# Patient Record
Sex: Male | Born: 1935 | Race: White | Hispanic: No | Marital: Married | Smoking: Former smoker
Health system: Southern US, Community
[De-identification: ages and names within clinical notes are randomized; demographics above are authoritative.]

## PROBLEM LIST (undated history)

## (undated) DIAGNOSIS — Z8711 Personal history of peptic ulcer disease: Secondary | ICD-10-CM

## (undated) DIAGNOSIS — Z954 Presence of other heart-valve replacement: Secondary | ICD-10-CM

## (undated) DIAGNOSIS — I480 Paroxysmal atrial fibrillation: Secondary | ICD-10-CM

## (undated) DIAGNOSIS — K4090 Unilateral inguinal hernia, without obstruction or gangrene, not specified as recurrent: Secondary | ICD-10-CM

## (undated) DIAGNOSIS — Z85828 Personal history of other malignant neoplasm of skin: Secondary | ICD-10-CM

## (undated) DIAGNOSIS — I351 Nonrheumatic aortic (valve) insufficiency: Secondary | ICD-10-CM

## (undated) DIAGNOSIS — L120 Bullous pemphigoid: Secondary | ICD-10-CM

## (undated) DIAGNOSIS — K635 Polyp of colon: Secondary | ICD-10-CM

## (undated) DIAGNOSIS — Z872 Personal history of diseases of the skin and subcutaneous tissue: Secondary | ICD-10-CM

## (undated) DIAGNOSIS — Z7901 Long term (current) use of anticoagulants: Secondary | ICD-10-CM

## (undated) DIAGNOSIS — N400 Enlarged prostate without lower urinary tract symptoms: Secondary | ICD-10-CM

## (undated) DIAGNOSIS — D649 Anemia, unspecified: Secondary | ICD-10-CM

## (undated) DIAGNOSIS — E785 Hyperlipidemia, unspecified: Secondary | ICD-10-CM

## (undated) DIAGNOSIS — D0471 Carcinoma in situ of skin of right lower limb, including hip: Secondary | ICD-10-CM

## (undated) HISTORY — DX: Benign prostatic hyperplasia without lower urinary tract symptoms: N40.0

## (undated) HISTORY — DX: Hyperlipidemia, unspecified: E78.5

## (undated) HISTORY — DX: Nonrheumatic aortic (valve) insufficiency: I35.1

## (undated) HISTORY — PX: OTHER SURGICAL HISTORY: SHX169

## (undated) HISTORY — DX: Personal history of peptic ulcer disease: Z87.11

## (undated) HISTORY — DX: Paroxysmal atrial fibrillation: I48.0

## (undated) HISTORY — DX: Anemia, unspecified: D64.9

## (undated) HISTORY — DX: Presence of other heart-valve replacement: Z95.4

## (undated) HISTORY — PX: TRANSTHORACIC ECHOCARDIOGRAM: SHX275

## (undated) HISTORY — DX: Unilateral inguinal hernia, without obstruction or gangrene, not specified as recurrent: K40.90

## (undated) HISTORY — DX: Bullous pemphigoid: L12.0

## (undated) HISTORY — DX: Long term (current) use of anticoagulants: Z79.01

## (undated) HISTORY — DX: Polyp of colon: K63.5

---

## 1996-03-28 DIAGNOSIS — Z954 Presence of other heart-valve replacement: Secondary | ICD-10-CM

## 1996-03-28 HISTORY — PX: MITRAL VALVE REPLACEMENT: SHX147

## 1996-03-28 HISTORY — DX: Presence of other heart-valve replacement: Z95.4

## 1996-04-08 DIAGNOSIS — Z952 Presence of prosthetic heart valve: Secondary | ICD-10-CM | POA: Insufficient documentation

## 1998-12-13 ENCOUNTER — Encounter: Payer: Self-pay | Admitting: Emergency Medicine

## 1998-12-13 ENCOUNTER — Emergency Department (HOSPITAL_COMMUNITY): Admission: EM | Admit: 1998-12-13 | Discharge: 1998-12-13 | Payer: Self-pay | Admitting: Emergency Medicine

## 2000-07-22 ENCOUNTER — Ambulatory Visit (HOSPITAL_COMMUNITY): Admission: RE | Admit: 2000-07-22 | Discharge: 2000-07-22 | Payer: Self-pay | Admitting: Gastroenterology

## 2003-05-01 ENCOUNTER — Ambulatory Visit (HOSPITAL_COMMUNITY): Admission: RE | Admit: 2003-05-01 | Discharge: 2003-05-01 | Payer: Self-pay | Admitting: Specialist

## 2003-08-26 ENCOUNTER — Encounter: Admission: RE | Admit: 2003-08-26 | Discharge: 2003-08-26 | Payer: Self-pay | Admitting: Orthopedic Surgery

## 2003-12-25 ENCOUNTER — Inpatient Hospital Stay (HOSPITAL_COMMUNITY): Admission: RE | Admit: 2003-12-25 | Discharge: 2003-12-28 | Payer: Self-pay | Admitting: Orthopedic Surgery

## 2007-01-18 ENCOUNTER — Ambulatory Visit (HOSPITAL_COMMUNITY): Admission: RE | Admit: 2007-01-18 | Discharge: 2007-01-18 | Payer: Self-pay | Admitting: Gastroenterology

## 2007-01-19 ENCOUNTER — Encounter (INDEPENDENT_AMBULATORY_CARE_PROVIDER_SITE_OTHER): Payer: Self-pay | Admitting: General Surgery

## 2007-01-19 ENCOUNTER — Ambulatory Visit (HOSPITAL_COMMUNITY): Admission: RE | Admit: 2007-01-19 | Discharge: 2007-01-19 | Payer: Self-pay | Admitting: General Surgery

## 2007-06-29 DIAGNOSIS — I351 Nonrheumatic aortic (valve) insufficiency: Secondary | ICD-10-CM

## 2007-06-29 HISTORY — DX: Nonrheumatic aortic (valve) insufficiency: I35.1

## 2010-07-18 ENCOUNTER — Encounter: Payer: Self-pay | Admitting: Orthopedic Surgery

## 2010-11-10 NOTE — Op Note (Signed)
Richard Spears, Richard Spears           ACCOUNT NO.:  [id number]   MEDICAL RECORD NO.:  [id number]          PATIENT TYPE:  AMB   LOCATION:  ENDO                         FACILITY:  [facility]   PHYSICIAN:  John C. Madilyn Fireman, M.D.    DATE OF BIRTH:  Apr 02, 1926   DATE OF PROCEDURE:  01/18/2007  DATE OF DISCHARGE:                               OPERATIVE REPORT   COLONOSCOPY:   INDICATIONS FOR PROCEDURE:  Rectal bleeding and history of benign colon  polyps 5 years ago in a patient undergoing a hemorrhoid surgery  tomorrow.   PROCEDURE:  The patient was placed in the left lateral decubitus  position and placed on the pulse monitor with continuous low-flow oxygen  delivered by nasal cannula.  He was sedated with 100 mcg IV fentanyl and  8 mg IV Versed.  The Olympus video colonoscope was inserted into the  rectum and advanced to cecum, confirmed by transillumination of  McBurney's point and visualization of the ileocecal valve and  appendiceal orifice.  The prep was fairly good.  The cecum, ascending,  transverse, descending, sigmoid and proximal rectum all appeared normal  with no masses, polyps, diverticula or other mucosal abnormalities.  In  the distal rectum just at the anal verge there was a whitish 1 cm fleshy  protuberance presumed to be a hemorrhoid.  It was not bleeding.  This  was photographed in retroflex and direct view.  The scope was then  withdrawn and the patient returned to the recovery room in stable  condition.  He tolerated the procedure well and there were no immediate  complications.   IMPRESSION:  Presumed thrombosed internal hemorrhoid, otherwise normal  study.  Plan for hemorrhoid surgery tomorrow.           ______________________________  Everardo All Madilyn Fireman, M.D.     JCH/MEDQ  D:  01/18/2007  T:  01/18/2007  Job:  [id number]   cc:   Thora Lance, M.D.  Fax: [phone]   Timothy E. Earlene Plater, M.D.  [address] [place]  [place] [address]

## 2010-11-10 NOTE — Op Note (Signed)
Richard Spears, Richard Spears           ACCOUNT NO.:  [id number]   MEDICAL RECORD NO.:  [id number]          PATIENT TYPE:  AMB   LOCATION:  DAY                          FACILITY:  [facility]   PHYSICIAN:  Timothy E. Earlene Plater, M.D. DATE OF BIRTH:  [birth year]   DATE OF PROCEDURE:  01/19/2007  DATE OF DISCHARGE:                               OPERATIVE REPORT   PREOPERATIVE DIAGNOSIS:  Prolapsing hemorrhoids.   POSTOPERATIVE DIAGNOSIS:  Prolapsing hemorrhoids.   PROCEDURE:  PPH hemorrhoidectomy.   SURGEON:  Timothy E. Earlene Plater, M.D.   ANESTHESIA:  General.   Mr. Waybright is 64, generally healthy except for mitral valve  replacement and medications attended to including Coumadin.  He has had  for some time large prolapsing hemorrhoids with soilage, difficulty  cleansing and bleeding.  After careful discussion and watchful waiting,  office treatment, he has elected to proceed with this surgery.  He has  been converted from Coumadin to Lovenox having taken the last dose  yesterday.  He is aware and does understand that chronic blood thinners  and particularly up until the time of surgery certainly risky incidents  of bleeding and complications of surgery potentially.  He is in  agreement.  He wishes to proceed.  He is seen, identified the permit  signed.   Taken to the operating room, placed supine.  LMA anesthesia provided.  He was placed in lithotomy position.  The perianal area inspected,  prepped and draped in the usual fashion.  Hemorrhoids prolapsed easily,  particularly left lateral and posterior.  These were reduced, anoscopy  carried out, redundant distal mucosa and prolapsing hemorrhoids.  The  area was injected round about with Marcaine, epinephrine and Wydase and  this was massaged in well.  The anus dilated nicely to the operating  anoscope and beginning anteriorly a pursestring suture 2-0 Prolene was  placed approximately 4 cm proximal to the dentate line.  This was  checked and was  felt to be appropriate.  The operating scope removed,  the plastic Ethicon scope inserted, sutures drawn through the center of  that scope and held firmly in place in the anorectum.  The opened  stapling device was then gently inserted into the anorectum until it  crossed the pursestring suture.  Pursestring suture was tied about the  central post of the stapling device and then those suture strings  brought down the shaft of the stapling device.  The staple device was  carefully positioned and then closed held for a minute, fired, held for  a minute, then removed.  The rectum was packed, the specimen was  adequate and was sent to pathology.  Careful inspection over the next 10  minutes revealed no complications.  There were couple areas of minor  bleeding that were suture-ligated with 4-0 Vicryl.  There was one skip  area in the left posterior quadrant that simply did not get pulled into  the pursestring tie down, probably I think due to the fact that there  was so much redundant mucosa.  In any case at this area was sutured  along the left lateral hemorrhoidal pole.  Again  no bleeding.  Gelfoam  wrapped in Surgicel was placed in the rectal canal, external dressing.  Counts correct.  He tolerated it well, was removed to recovery room in good condition.  Written and verbal instructions given him and his wife including  Vicodin.  He will be seen and followed as an outpatient.  We will  carefully restart the Lovenox as directed.      Timothy E. Earlene Plater, M.D.  Electronically Signed     TED/MEDQ  D:  01/19/2007  T:  01/20/2007  Job:  [id number]   cc:   Everardo All. Madilyn Fireman, M.D.  Fax: [phone]   Francisca December, M.D.  Fax: [phone]   Thora Lance, M.D.  Fax: [phone]

## 2010-11-13 NOTE — Discharge Summary (Signed)
NAME:  Richard Spears, Richard Spears                     ACCOUNT NO.:  [id number]   MEDICAL RECORD NO.:  [id number]                   PATIENT TYPE:  INP   LOCATION:  0470                                 FACILITY:  [facility]   PHYSICIAN:  Ollen Gross, M.D.                 DATE OF BIRTH:  [birth year]   DATE OF ADMISSION:  12/25/2003  DATE OF DISCHARGE:  12/28/2003                                 DISCHARGE SUMMARY   ADMISSION DIAGNOSES:  1. Osteoarthritis left hip.  2. Valvular heart disease.  3. He murmur.  4. NSAID induced gastrointestinal bleed.  5. Peptic ulcer disease.  6. History of hemorrhoids.  7. Benign prostatic hypertrophy.  8. Osteoarthritis.  9. Status post mitral valve replacement, 1997.   DISCHARGE DIAGNOSES:  1. Osteoarthritis left hip status post left total hip arthroplasty.  2. Postoperative blood loss anemia, did not require transfusion.  3. Mild postoperative hyponatremia improving.  4. Valvular heart disease.  5. He murmur.  6. NSAID induced gastrointestinal bleed.  7. Peptic ulcer disease.  8. History of hemorrhoids.  9. Benign prostatic hypertrophy.  10.      Osteoarthritis.  11.      Status post mitral valve replacement, 1997.   PROCEDURE:  Date of surgery December 25, 2003, left total hip arthroplasty,  surgeon Ollen Gross, M.D., assistant Avel Peace, P.A.-C, anesthesia  general, blood loss 400 mL, Hemovac drain x1.  Complications none.   BRIEF HISTORY:  Richard Spears is a 77 year old male a 6-8 month history of  progressively worsening left hip pain.  He has rapidly progressed arthritis  on x-rays.  He has intractable pain and now presents for a total hip  arthroplasty.   LABORATORY DATA:  CBC on admission showed a hemoglobin of 13.0, hematocrit  of 38.2, white cell count of 2.7, differential all within normal limits.  Postoperative H&H 10.0 and 29.4.  Last noted H&H 8.7 and 25.7.  PT and PTT  preop 23.5 and 47 with an elevated INR of 2.9.  Coag's were  rechecked on the  morning of surgery, normal pro time of 13.0, INR 1.0, PTT of 30. Serial pro  times followed postoperatively, last noted PT and INR 16.7 and 1.6.  Chem  panel on admission all within normal limits. Serial BMET's were followed.  Sodium dropped from 137 down to 132 back up to 133, glucose went up from 79  to 145 back down to 125.  Calcium initially noted at 9.0, last noted at 8.2.  Urinalysis preop negative.  Blood group type O positive.   EKG dated December 19, 2003, sinus bradycardia otherwise normal EKG confirmed by  Learta Codding, M.D.  Chest x-ray two view December 19, 2003 mild chronic  obstructive pulmonary disease, no acute findings.  Left hip films dated December 19, 2003, bilateral hip degenerative arthritis, degenerative changes left  hip greater than right.  Hip and pelvis films postop on December 25, 2003  anatomic alignment status post left total hip arthroplasty no complications.   HOSPITAL COURSE:  The patient admitted to [facility], taken to OR,  underwent above stated procedure, without complications.  The patient  tolerated the procedure well, later transferred to the recovery room then to  the orthopedic floor for continued postop care. Vital signs were followed,  Hemovac drain placed the time of surgery  On postoperative day one, on the  evening of surgery and the early morning of day one, the patient did have  somewhat of a panic attack through the night. He was given Ativan and did  have some benefit. He was started on Lovenox along with his Coumadin because  of his mitral valve replacement for the DVT prophylaxis.  He was on Coumadin  preoperatively which was stopped and now he is placed back on Lovenox and  Coumadin until his Coumadin is therapeutic at which time Lovenox will was  discontinued. By day two, he was feeling much better, the panic attack had  resolved.  PC and IV's were discontinued along with his Foley. He did have a  little bit of a tape  blister when the dressing was changed. The incision was  healing well, Tegaderm was placed over the tape blister. From a therapy  standpoint, Richard Spears did extremely well. He was up ambulating  approximately 80 feet by day one, 150 feet later that evening and still  going over 100 feet on day two. It was felt that he was doing extremely well  and would be ready to go home in the next day or so.  By day three, he was  feeling great, he was asking to be discharged.  His INR was not quite  therapeutic but it was on the rise at 1.6 therefore he was discharged home  on Lovenox and Coumadin.   PLAN:  1. The patient was discharged home on December 28, 2003.  2. Discharge diagnoses please see above.  3. Discharge medications:  Percocet, Robaxin, Coumadin, also given Lovenox     and also Trinsicon.  4. Diet, cardiac diet.  5. Activity, he is 50% partial weight bearing to the left lower extremity.     Home health PT, home health nursing for total hip protocol, hip     precautions.   FOLLOW UP:  Two weeks from surgery, call the office for an appointment at  [phone].   DISPOSITION:  Home.   CONDITION ON DISCHARGE:  Improve.     Richard Spears, P.A.              Ollen Gross, M.D.    ALP/MEDQ  D:  02/03/2004  T:  02/04/2004  Job:  [id number]   cc:   Francisca December, M.D.  [address]  [place]  [place] [address]  Fax: [phone]   Everardo All. Madilyn Fireman, M.D.  [address]  [place]  [place] [address]  Fax: [phone]   Thora Lance, M.D.  [address]  [place]  [place] [address]  Fax: [phone]

## 2010-11-13 NOTE — Op Note (Signed)
NAME:  Richard Spears, Richard Spears                     ACCOUNT NO.:  [id number]   MEDICAL RECORD NO.:  [id number]                   PATIENT TYPE:  INP   LOCATION:  0006                                 FACILITY:  [facility]   PHYSICIAN:  Ollen Gross, M.D.                 DATE OF BIRTH:  December 07, 1925   DATE OF PROCEDURE:  12/25/2003  DATE OF DISCHARGE:                                 OPERATIVE REPORT   PREOPERATIVE DIAGNOSIS:  Osteoarthritis of the left hip.   POSTOPERATIVE DIAGNOSIS:  Osteoarthritis of the left hip.   OPERATION PERFORMED:  Left total hip arthroplasty.   SURGEON:  Ollen Gross, M.D.   ASSISTANT:  Alexzandrew L. Julien Girt, P.A.   ANESTHESIA:  General.   ESTIMATED BLOOD LOSS:  400 mL.   DRAINS:  Hemovac times one.   COMPLICATIONS:  None.   DISPOSITION:  Stable to recovery.   INDICATIONS FOR PROCEDURE:  Richard Spears is a 77 year old male with a six  to eight month history of progressively worsening left hip pain and rapidly  progressive arthritis on radiographs.  He has had intractable pain and  presents now for total hip arthroplasty.   DESCRIPTION OF PROCEDURE:  After successful administration of general  anesthetic, the patient was placed in the right lateral decubitus position  with the left side up and held with a hip positioner.  The left lower  extremity was isolated from the perineum with plastic drapes and prepped and  draped in the usual sterile fashion.  A small posterolateral incision was  made with a 10 blade through subcutaneous tissue to the level of the fascia  lata which was incised in line with the skin incision.  Sciatic nerve was  palpated and protected and the short rotators isolated off the femur.  Capsulectomy was performed as the hip was dislocated. The center of the  femoral head was marked and a trial prosthesis placed such that the center  head corresponded to the center of his native femoral head. Osteotomy line  was marked on the femoral neck  and osteotomy made with an oscillating saw.  The femur was then retracted anteriorly and acetabular exposure obtained.  The labrum ws removed.  There were no significant osteophytes.  Reaming  starts at a 49 coursing increments of 2 to 55 and then a 56 mm Pinnacle  acetabular shell was placed in anatomic position and transfixed with two  dome screws.  Trial 32 mm neutral liner was placed.  The femur was then  prepared with the canal finger and irrigation.  Axial reaming was performed  at 15.5 mm proximal reaming to a 58F and the sleeve machined to a large.  58F large sleeve was placed with a 20 x 15 stem and a 36 + 12 trial neck.  His anteversion is neutral, so I put him in approximately 20 degrees of  anteversion.  A 32 +0 head was placed.  He had a  fair amount of abductor  laxity with this so we went through a neutral +4 liner and a 32 +3 head.  With this, his soft tissues were under much better tension.  His stability  was outstanding with full extension, full external rotation, 70 degrees  flexion, 40 degrees abduction, 90 degrees internal rotation, 90 degrees  flexion, 90 degrees internal rotation.  I could also flex him up to about  140 and he did not dislocate.  By placing the left leg on top of the right,  the leg lengths are equal.  The trials were then removed.  A permanent Apex  hole eliminator placed to the acetabular shell.  Permanent 32 +4 neutral  marathon liner was placed into the shell.  The 36F large sleeve was placed  with a 20 x 15 stem and a 36 +12 neck.  Once again, he was about 20 degrees  beyond his native anteversion.  A 32 +3 head was placed and the hip was  reduced to the same stability parameters.  The wound was irrigated with  antibiotic solution and short rotators were reattached to the femur through  drill holes.  Fascia lata was closed over a Hemovac drain with interrupted  #1 Vicryl, subcu closed with #1 and 2-0 Vicryl and subcuticular running 4-0  Monocryl.   Incision was cleaned and dried and Steri-Strips applied.  Prior  to this, 20 mL of 0.25% Marcaine with epinephrine were injected in the  subcutaneous tissues adjacent to the incision.  Once the Steri-Strips were  on, then a bulky sterile dressing was applied.  Drain hooked to suction.  He  was placed into a knee immobilizer, awakened and transported to recovery in  stable condition.                                               Ollen Gross, M.D.    FA/MEDQ  D:  12/25/2003  T:  12/25/2003  Job:  [id number]

## 2010-11-13 NOTE — Procedures (Signed)
[facility]  Patient:    Richard Spears, Richard Spears                  MRN: [id number] Proc. Date: 07/22/00 Adm. Date:  [id number] Disc. Date: [id number] Attending:  Louie Bun CC:         Francisca December, M.D.  Gretta Arab Valentina Lucks, M.D.   Procedure Report  PROCEDURE:  Colonoscopy with cautery.  INDICATIONS FOR PROCEDURE:  Screening colonoscopy in a 77 year old patient with no prior colon screening.  DESCRIPTION OF PROCEDURE:  The patient was placed in the left lateral decubitus position and placed on the pulse monitor, with continuous low-flow oxygen delivered by nasal cannula.  He was premedicated with 2 g IV ampicillin and 100 mg IV gentamicin due to a mechanical heart valve.  He was sedated with 50 mg IV Demerol and 5 mg IV Versed.  The Olympus video colonoscope was inserted into the rectum and advanced to the cecum, confirmed by transillumination of McBurneys point and visualization of the ileocecal valve and appendiceal orifice.  The prep was excellent.  The cecum and ascending colon appeared normal with no masses, polyps, diverticula or other mucosal abnormalities.  Within the transverse colon there was seen a single shallow translucent 8 mm polyp; due to the patients being on Coumadin, I elected to cauterize this in place with the hot biopsy forceps with no tissue being removed with the forceps.  No bleeding ensued from this.  The remainder of the transverse, descending, sigmoid and rectum all appeared normal with no further polyps, masses, diverticula or other mucosal abnormalities.  The scope was then withdrawn and the patient returned to the recovery room in stable condition.  He tolerated the procedure well and there were no immediate complications.  IMPRESSION:  Single small transverse colon polyp fulgurated with the hot biopsy forceps.  PLAN:  Continue average risk screening.  Consider a repeat flexible sigmoidoscopy or colonoscopy in  five years. DD:  07/22/00 TD:  07/22/00 Job: [id number]

## 2010-11-13 NOTE — H&P (Signed)
NAME:  Richard Spears, Richard Spears                     ACCOUNT NO.:  [id number]   MEDICAL RECORD NO.:  [id number]                   PATIENT TYPE:  INP   LOCATION:  0470                                 FACILITY:  [facility]   PHYSICIAN:  Ollen Gross, M.D.                 DATE OF BIRTH:  01-23-1926   DATE OF ADMISSION:  12/25/2003  DATE OF DISCHARGE:  12/28/2003                                HISTORY & PHYSICAL   CHIEF COMPLAINT:  The patient is a 77 year old male seen by Dr. Lequita Halt for  ongoing left hip pain.  The patient has had a history of progressive hip  pain for almost a year now, initially started in the groin and worsened and  radiating down the hip.  Has progressed to the point where it is starting to  interfere with his activities, hurting all the time including at night.  He  is seen in office as a second opinion by Dr. Lequita Halt.  X-rays which are sent  over from Dr. Nolon Nations office, October of 2004 showed minimal __________  changes.  He did have an MRI which did show large, diffuse and moderate  degenerative changes.  Follow up x-rays showed increased narrowing of the  hip.  He is felt to have a form rapidly progressive osteoarthritis.  Due to  his progressive nature of the pain, it is felt he would benefit from  undergoing hip replacement.  Risks and benefits of the procedure have been  discussed with the patient and he would like the procedure.   PAST MEDICAL HISTORY:  Valvular heart disease, heart murmur, NSAID-induced  gastrointestinal bleed, peptic ulcer disease, history of hemorrhoids, benign  prostatic hypertrophy, osteoarthritis.   PAST SURGICAL HISTORY:  Mitral valve replacement 1997, inguinal hernia  repair in 1996, cauterization of a bleeding ulcer in 1995, lipoma incision  in 1949.   ALLERGIES:  FLEXERIL causes diarrhea.   CURRENT MEDICATIONS:  Bisoprolol fumarate 5 mg daily, Coumadin 5 mg a day  with an additional 2.5 mg on Monday, lorazepam 1 mg as needed.   SOCIAL HISTORY:  Married, retired, nonsmoker.  Has 2 drinks of alcohol  daily.  Has 2 children.  His spouse will be assisting with care after  surgery.   FAMILY HISTORY:  Mother deceased with history of arthritis and hypertension.  Her has a sister deceased with a history of cancer.   REVIEW OF SYSTEMS:  GENERAL:  No fevers, chills, night sweats.  No seizures,  syncope, paralysis.  RESPIRATORY:  No shortness of breath, productive cough  or hemoptysis.  CARDIOVASCULAR:  Does have some valvular disease requiring  valve replacement.  No chest pain or orthopnea.  GI:  No nausea and  vomiting, diarrhea or constipation.  GU:  Does have a little bit of nocturia  and some frequency secondary to his prostate disease.  MUSCULOSKELETAL:  Hip  as described in history of present illness.   PHYSICAL EXAMINATION:  VITAL  SIGNS:  Pulse 61, respirations 13, blood  pressure 128/90.  GENERAL:  77 year old male, well-nourished, well-developed, no acute  distress, alert and oriented, cooperative.  HEENT:  Normocephalic and atraumatic, with pupils round and reactive.  EOMs  intact.  He is noted to wear glasses.  NECK:  Supple.  CHEST:  Clear to anterior and posterior auscultation.  No rhonchi, rales or  wheezing.  HEART:  Regular rate and rhythm.  He does have a metallic click, otherwise  S1 and S2 noted, metallic click from this mitral valve.  ABDOMEN:  Soft, bowel sounds resonant, __________.  EXTREMITIES:  __________ to the left hip.  He does have limited motion of  the left hip with flexion of only 110 degrees, internal rotation, external  rotation of 10 and 20, abduction of 30.   IMPRESSION:  1. Osteoarthritis left hip.   PLAN:  The patient will be admitted to [facility] to undergo a  left total hip replacement arthroplasty.  Surgery will be performed by Dr.  Ollen Gross.     Alexzandrew L. Julien Girt, P.A.              Ollen Gross, M.D.    ALP/MEDQ  D:  01/26/2004  T:  01/26/2004   Job:  [id number]

## 2010-11-24 ENCOUNTER — Other Ambulatory Visit: Payer: Self-pay | Admitting: Dermatology

## 2011-01-19 HISTORY — PX: TRANSTHORACIC ECHOCARDIOGRAM: SHX275

## 2011-01-26 ENCOUNTER — Other Ambulatory Visit: Payer: Self-pay | Admitting: Dermatology

## 2011-03-30 ENCOUNTER — Other Ambulatory Visit: Payer: Self-pay | Admitting: Dermatology

## 2011-04-12 LAB — DIFFERENTIAL
Eosinophils Absolute: 0.1
Eosinophils Relative: 4
Lymphocytes Relative: 24
Monocytes Absolute: 0.3
Neutro Abs: 1.2 — ABNORMAL LOW
Neutrophils Relative %: 56

## 2011-04-12 LAB — COMPREHENSIVE METABOLIC PANEL
CO2: 29
Calcium: 9.4
GFR calc Af Amer: >60
Glucose, Bld: 89
Potassium: 4.4
Sodium: 137

## 2011-04-12 LAB — CBC
HCT: 38.1 — ABNORMAL LOW
Hemoglobin: 13.2
Platelets: 174
RBC: 4.04 — ABNORMAL LOW

## 2011-04-14 ENCOUNTER — Other Ambulatory Visit: Payer: Self-pay | Admitting: Dermatology

## 2011-07-26 DIAGNOSIS — Z7901 Long term (current) use of anticoagulants: Secondary | ICD-10-CM | POA: Diagnosis not present

## 2011-07-26 DIAGNOSIS — Z954 Presence of other heart-valve replacement: Secondary | ICD-10-CM | POA: Diagnosis not present

## 2011-08-03 ENCOUNTER — Other Ambulatory Visit: Payer: Self-pay | Admitting: Dermatology

## 2011-08-03 DIAGNOSIS — C44721 Squamous cell carcinoma of skin of unspecified lower limb, including hip: Secondary | ICD-10-CM | POA: Diagnosis not present

## 2011-09-06 DIAGNOSIS — L129 Pemphigoid, unspecified: Secondary | ICD-10-CM | POA: Diagnosis not present

## 2011-09-06 DIAGNOSIS — Z954 Presence of other heart-valve replacement: Secondary | ICD-10-CM | POA: Diagnosis not present

## 2011-09-06 DIAGNOSIS — Z7901 Long term (current) use of anticoagulants: Secondary | ICD-10-CM | POA: Diagnosis not present

## 2011-09-06 DIAGNOSIS — L570 Actinic keratosis: Secondary | ICD-10-CM | POA: Diagnosis not present

## 2011-09-08 DIAGNOSIS — C4492 Squamous cell carcinoma of skin, unspecified: Secondary | ICD-10-CM | POA: Diagnosis not present

## 2011-09-13 DIAGNOSIS — Z954 Presence of other heart-valve replacement: Secondary | ICD-10-CM | POA: Diagnosis not present

## 2011-09-13 DIAGNOSIS — Z7901 Long term (current) use of anticoagulants: Secondary | ICD-10-CM | POA: Diagnosis not present

## 2011-10-18 DIAGNOSIS — Z954 Presence of other heart-valve replacement: Secondary | ICD-10-CM | POA: Diagnosis not present

## 2011-10-18 DIAGNOSIS — Z7901 Long term (current) use of anticoagulants: Secondary | ICD-10-CM | POA: Diagnosis not present

## 2011-12-07 DIAGNOSIS — Z85828 Personal history of other malignant neoplasm of skin: Secondary | ICD-10-CM | POA: Diagnosis not present

## 2011-12-07 DIAGNOSIS — D485 Neoplasm of uncertain behavior of skin: Secondary | ICD-10-CM | POA: Diagnosis not present

## 2011-12-08 DIAGNOSIS — Z7901 Long term (current) use of anticoagulants: Secondary | ICD-10-CM | POA: Diagnosis not present

## 2011-12-08 DIAGNOSIS — Z954 Presence of other heart-valve replacement: Secondary | ICD-10-CM | POA: Diagnosis not present

## 2011-12-17 DIAGNOSIS — Z7901 Long term (current) use of anticoagulants: Secondary | ICD-10-CM | POA: Diagnosis not present

## 2011-12-17 DIAGNOSIS — Z954 Presence of other heart-valve replacement: Secondary | ICD-10-CM | POA: Diagnosis not present

## 2011-12-23 DIAGNOSIS — J029 Acute pharyngitis, unspecified: Secondary | ICD-10-CM | POA: Diagnosis not present

## 2011-12-23 DIAGNOSIS — R51 Headache: Secondary | ICD-10-CM | POA: Diagnosis not present

## 2012-01-11 DIAGNOSIS — I872 Venous insufficiency (chronic) (peripheral): Secondary | ICD-10-CM | POA: Diagnosis not present

## 2012-01-11 DIAGNOSIS — I059 Rheumatic mitral valve disease, unspecified: Secondary | ICD-10-CM | POA: Diagnosis not present

## 2012-01-21 DIAGNOSIS — Z954 Presence of other heart-valve replacement: Secondary | ICD-10-CM | POA: Diagnosis not present

## 2012-01-21 DIAGNOSIS — Z7901 Long term (current) use of anticoagulants: Secondary | ICD-10-CM | POA: Diagnosis not present

## 2012-03-03 DIAGNOSIS — Z954 Presence of other heart-valve replacement: Secondary | ICD-10-CM | POA: Diagnosis not present

## 2012-03-03 DIAGNOSIS — Z7901 Long term (current) use of anticoagulants: Secondary | ICD-10-CM | POA: Diagnosis not present

## 2012-04-14 DIAGNOSIS — Z954 Presence of other heart-valve replacement: Secondary | ICD-10-CM | POA: Diagnosis not present

## 2012-04-14 DIAGNOSIS — Z7901 Long term (current) use of anticoagulants: Secondary | ICD-10-CM | POA: Diagnosis not present

## 2012-04-25 DIAGNOSIS — R42 Dizziness and giddiness: Secondary | ICD-10-CM | POA: Diagnosis not present

## 2012-05-04 DIAGNOSIS — IMO0002 Reserved for concepts with insufficient information to code with codable children: Secondary | ICD-10-CM | POA: Diagnosis not present

## 2012-05-11 DIAGNOSIS — M79609 Pain in unspecified limb: Secondary | ICD-10-CM | POA: Diagnosis not present

## 2012-05-12 DIAGNOSIS — Z954 Presence of other heart-valve replacement: Secondary | ICD-10-CM | POA: Diagnosis not present

## 2012-05-12 DIAGNOSIS — Z7901 Long term (current) use of anticoagulants: Secondary | ICD-10-CM | POA: Diagnosis not present

## 2012-05-17 DIAGNOSIS — Z7901 Long term (current) use of anticoagulants: Secondary | ICD-10-CM | POA: Diagnosis not present

## 2012-05-17 DIAGNOSIS — Z954 Presence of other heart-valve replacement: Secondary | ICD-10-CM | POA: Diagnosis not present

## 2012-05-18 DIAGNOSIS — M25569 Pain in unspecified knee: Secondary | ICD-10-CM | POA: Diagnosis not present

## 2012-05-18 DIAGNOSIS — M79609 Pain in unspecified limb: Secondary | ICD-10-CM | POA: Diagnosis not present

## 2012-05-30 DIAGNOSIS — M79609 Pain in unspecified limb: Secondary | ICD-10-CM | POA: Diagnosis not present

## 2012-05-30 DIAGNOSIS — M25569 Pain in unspecified knee: Secondary | ICD-10-CM | POA: Diagnosis not present

## 2012-06-01 DIAGNOSIS — Z1331 Encounter for screening for depression: Secondary | ICD-10-CM | POA: Diagnosis not present

## 2012-06-01 DIAGNOSIS — IMO0002 Reserved for concepts with insufficient information to code with codable children: Secondary | ICD-10-CM | POA: Diagnosis not present

## 2012-06-01 DIAGNOSIS — N400 Enlarged prostate without lower urinary tract symptoms: Secondary | ICD-10-CM | POA: Diagnosis not present

## 2012-06-01 DIAGNOSIS — I359 Nonrheumatic aortic valve disorder, unspecified: Secondary | ICD-10-CM | POA: Diagnosis not present

## 2012-06-01 DIAGNOSIS — Z954 Presence of other heart-valve replacement: Secondary | ICD-10-CM | POA: Diagnosis not present

## 2012-06-01 DIAGNOSIS — Z7901 Long term (current) use of anticoagulants: Secondary | ICD-10-CM | POA: Diagnosis not present

## 2012-06-01 DIAGNOSIS — R799 Abnormal finding of blood chemistry, unspecified: Secondary | ICD-10-CM | POA: Diagnosis not present

## 2012-06-01 DIAGNOSIS — I872 Venous insufficiency (chronic) (peripheral): Secondary | ICD-10-CM | POA: Diagnosis not present

## 2012-06-23 DIAGNOSIS — Z7901 Long term (current) use of anticoagulants: Secondary | ICD-10-CM | POA: Diagnosis not present

## 2012-06-23 DIAGNOSIS — Z954 Presence of other heart-valve replacement: Secondary | ICD-10-CM | POA: Diagnosis not present

## 2012-07-24 DIAGNOSIS — Z954 Presence of other heart-valve replacement: Secondary | ICD-10-CM | POA: Diagnosis not present

## 2012-07-24 DIAGNOSIS — Z7901 Long term (current) use of anticoagulants: Secondary | ICD-10-CM | POA: Diagnosis not present

## 2012-09-12 DIAGNOSIS — I059 Rheumatic mitral valve disease, unspecified: Secondary | ICD-10-CM | POA: Diagnosis not present

## 2012-09-12 DIAGNOSIS — I4891 Unspecified atrial fibrillation: Secondary | ICD-10-CM | POA: Diagnosis not present

## 2012-10-02 DIAGNOSIS — Z7901 Long term (current) use of anticoagulants: Secondary | ICD-10-CM | POA: Diagnosis not present

## 2012-10-02 DIAGNOSIS — Z954 Presence of other heart-valve replacement: Secondary | ICD-10-CM | POA: Diagnosis not present

## 2012-10-12 DIAGNOSIS — I059 Rheumatic mitral valve disease, unspecified: Secondary | ICD-10-CM | POA: Diagnosis not present

## 2012-10-12 DIAGNOSIS — I4891 Unspecified atrial fibrillation: Secondary | ICD-10-CM | POA: Diagnosis not present

## 2012-10-12 DIAGNOSIS — Z954 Presence of other heart-valve replacement: Secondary | ICD-10-CM | POA: Diagnosis not present

## 2012-10-12 DIAGNOSIS — R0602 Shortness of breath: Secondary | ICD-10-CM | POA: Diagnosis not present

## 2012-10-16 DIAGNOSIS — I8310 Varicose veins of unspecified lower extremity with inflammation: Secondary | ICD-10-CM | POA: Diagnosis not present

## 2012-10-16 DIAGNOSIS — M7981 Nontraumatic hematoma of soft tissue: Secondary | ICD-10-CM | POA: Diagnosis not present

## 2012-10-16 DIAGNOSIS — Z85828 Personal history of other malignant neoplasm of skin: Secondary | ICD-10-CM | POA: Diagnosis not present

## 2012-10-19 DIAGNOSIS — S8010XA Contusion of unspecified lower leg, initial encounter: Secondary | ICD-10-CM | POA: Diagnosis not present

## 2012-10-24 DIAGNOSIS — Z79899 Other long term (current) drug therapy: Secondary | ICD-10-CM | POA: Diagnosis not present

## 2012-10-24 DIAGNOSIS — R0602 Shortness of breath: Secondary | ICD-10-CM | POA: Diagnosis not present

## 2012-10-24 DIAGNOSIS — I4891 Unspecified atrial fibrillation: Secondary | ICD-10-CM | POA: Diagnosis not present

## 2012-10-24 DIAGNOSIS — I059 Rheumatic mitral valve disease, unspecified: Secondary | ICD-10-CM | POA: Diagnosis not present

## 2012-10-24 DIAGNOSIS — Z954 Presence of other heart-valve replacement: Secondary | ICD-10-CM | POA: Diagnosis not present

## 2012-11-08 DIAGNOSIS — Z954 Presence of other heart-valve replacement: Secondary | ICD-10-CM | POA: Diagnosis not present

## 2012-11-08 DIAGNOSIS — S8010XA Contusion of unspecified lower leg, initial encounter: Secondary | ICD-10-CM | POA: Diagnosis not present

## 2012-11-08 DIAGNOSIS — Z7901 Long term (current) use of anticoagulants: Secondary | ICD-10-CM | POA: Diagnosis not present

## 2012-11-08 DIAGNOSIS — H9190 Unspecified hearing loss, unspecified ear: Secondary | ICD-10-CM | POA: Diagnosis not present

## 2012-11-15 DIAGNOSIS — I059 Rheumatic mitral valve disease, unspecified: Secondary | ICD-10-CM | POA: Diagnosis not present

## 2012-11-15 DIAGNOSIS — I4891 Unspecified atrial fibrillation: Secondary | ICD-10-CM | POA: Diagnosis not present

## 2012-11-15 DIAGNOSIS — R0602 Shortness of breath: Secondary | ICD-10-CM | POA: Diagnosis not present

## 2012-11-17 DIAGNOSIS — Z4800 Encounter for change or removal of nonsurgical wound dressing: Secondary | ICD-10-CM | POA: Diagnosis not present

## 2012-11-22 DIAGNOSIS — H524 Presbyopia: Secondary | ICD-10-CM | POA: Diagnosis not present

## 2012-11-22 DIAGNOSIS — Z7901 Long term (current) use of anticoagulants: Secondary | ICD-10-CM | POA: Diagnosis not present

## 2012-11-22 DIAGNOSIS — H04129 Dry eye syndrome of unspecified lacrimal gland: Secondary | ICD-10-CM | POA: Diagnosis not present

## 2012-11-22 DIAGNOSIS — I4891 Unspecified atrial fibrillation: Secondary | ICD-10-CM | POA: Diagnosis not present

## 2012-12-06 DIAGNOSIS — Z7901 Long term (current) use of anticoagulants: Secondary | ICD-10-CM | POA: Diagnosis not present

## 2012-12-06 DIAGNOSIS — Z954 Presence of other heart-valve replacement: Secondary | ICD-10-CM | POA: Diagnosis not present

## 2013-01-01 DIAGNOSIS — I059 Rheumatic mitral valve disease, unspecified: Secondary | ICD-10-CM | POA: Diagnosis not present

## 2013-01-01 DIAGNOSIS — R0602 Shortness of breath: Secondary | ICD-10-CM | POA: Diagnosis not present

## 2013-01-01 DIAGNOSIS — Z7901 Long term (current) use of anticoagulants: Secondary | ICD-10-CM | POA: Diagnosis not present

## 2013-01-01 DIAGNOSIS — I4891 Unspecified atrial fibrillation: Secondary | ICD-10-CM | POA: Diagnosis not present

## 2013-01-01 DIAGNOSIS — Z954 Presence of other heart-valve replacement: Secondary | ICD-10-CM | POA: Diagnosis not present

## 2013-01-08 DIAGNOSIS — Z85828 Personal history of other malignant neoplasm of skin: Secondary | ICD-10-CM | POA: Diagnosis not present

## 2013-01-08 DIAGNOSIS — D485 Neoplasm of uncertain behavior of skin: Secondary | ICD-10-CM | POA: Diagnosis not present

## 2013-01-26 DIAGNOSIS — Z954 Presence of other heart-valve replacement: Secondary | ICD-10-CM | POA: Diagnosis not present

## 2013-01-26 DIAGNOSIS — Z7901 Long term (current) use of anticoagulants: Secondary | ICD-10-CM | POA: Diagnosis not present

## 2013-02-23 DIAGNOSIS — Z7901 Long term (current) use of anticoagulants: Secondary | ICD-10-CM | POA: Diagnosis not present

## 2013-02-23 DIAGNOSIS — Z954 Presence of other heart-valve replacement: Secondary | ICD-10-CM | POA: Diagnosis not present

## 2013-03-15 DIAGNOSIS — I4891 Unspecified atrial fibrillation: Secondary | ICD-10-CM | POA: Diagnosis not present

## 2013-03-15 DIAGNOSIS — I059 Rheumatic mitral valve disease, unspecified: Secondary | ICD-10-CM | POA: Diagnosis not present

## 2013-03-15 DIAGNOSIS — R0602 Shortness of breath: Secondary | ICD-10-CM | POA: Diagnosis not present

## 2013-03-26 DIAGNOSIS — Z954 Presence of other heart-valve replacement: Secondary | ICD-10-CM | POA: Diagnosis not present

## 2013-03-26 DIAGNOSIS — Z7901 Long term (current) use of anticoagulants: Secondary | ICD-10-CM | POA: Diagnosis not present

## 2013-05-04 DIAGNOSIS — L570 Actinic keratosis: Secondary | ICD-10-CM | POA: Diagnosis not present

## 2013-05-04 DIAGNOSIS — Z85828 Personal history of other malignant neoplasm of skin: Secondary | ICD-10-CM | POA: Diagnosis not present

## 2013-05-04 DIAGNOSIS — D485 Neoplasm of uncertain behavior of skin: Secondary | ICD-10-CM | POA: Diagnosis not present

## 2013-05-04 DIAGNOSIS — D1801 Hemangioma of skin and subcutaneous tissue: Secondary | ICD-10-CM | POA: Diagnosis not present

## 2013-05-04 DIAGNOSIS — C44519 Basal cell carcinoma of skin of other part of trunk: Secondary | ICD-10-CM | POA: Diagnosis not present

## 2013-05-04 DIAGNOSIS — L723 Sebaceous cyst: Secondary | ICD-10-CM | POA: Diagnosis not present

## 2013-05-09 ENCOUNTER — Ambulatory Visit (INDEPENDENT_AMBULATORY_CARE_PROVIDER_SITE_OTHER): Payer: Medicare Other | Admitting: Pharmacist

## 2013-05-09 DIAGNOSIS — Z954 Presence of other heart-valve replacement: Secondary | ICD-10-CM | POA: Diagnosis not present

## 2013-05-09 DIAGNOSIS — I059 Rheumatic mitral valve disease, unspecified: Secondary | ICD-10-CM | POA: Diagnosis not present

## 2013-05-11 ENCOUNTER — Other Ambulatory Visit: Payer: Self-pay | Admitting: Interventional Cardiology

## 2013-05-11 DIAGNOSIS — R5381 Other malaise: Secondary | ICD-10-CM | POA: Diagnosis not present

## 2013-06-18 ENCOUNTER — Ambulatory Visit (INDEPENDENT_AMBULATORY_CARE_PROVIDER_SITE_OTHER): Payer: Medicare Other | Admitting: Pharmacist

## 2013-06-18 DIAGNOSIS — I059 Rheumatic mitral valve disease, unspecified: Secondary | ICD-10-CM | POA: Diagnosis not present

## 2013-06-18 DIAGNOSIS — Z954 Presence of other heart-valve replacement: Secondary | ICD-10-CM

## 2013-07-24 ENCOUNTER — Other Ambulatory Visit: Payer: Self-pay | Admitting: Dermatology

## 2013-07-24 DIAGNOSIS — Z85828 Personal history of other malignant neoplasm of skin: Secondary | ICD-10-CM | POA: Diagnosis not present

## 2013-07-24 DIAGNOSIS — C44721 Squamous cell carcinoma of skin of unspecified lower limb, including hip: Secondary | ICD-10-CM | POA: Diagnosis not present

## 2013-07-24 DIAGNOSIS — D485 Neoplasm of uncertain behavior of skin: Secondary | ICD-10-CM | POA: Diagnosis not present

## 2013-07-30 ENCOUNTER — Ambulatory Visit (INDEPENDENT_AMBULATORY_CARE_PROVIDER_SITE_OTHER): Payer: Medicare Other | Admitting: *Deleted

## 2013-07-30 ENCOUNTER — Other Ambulatory Visit: Payer: Self-pay | Admitting: Interventional Cardiology

## 2013-07-30 DIAGNOSIS — Z954 Presence of other heart-valve replacement: Secondary | ICD-10-CM

## 2013-07-30 DIAGNOSIS — I059 Rheumatic mitral valve disease, unspecified: Secondary | ICD-10-CM

## 2013-07-30 LAB — POCT INR: INR: 2.5

## 2013-07-31 ENCOUNTER — Other Ambulatory Visit: Payer: Self-pay | Admitting: Dermatology

## 2013-07-31 DIAGNOSIS — L570 Actinic keratosis: Secondary | ICD-10-CM | POA: Diagnosis not present

## 2013-07-31 DIAGNOSIS — C44721 Squamous cell carcinoma of skin of unspecified lower limb, including hip: Secondary | ICD-10-CM | POA: Diagnosis not present

## 2013-07-31 DIAGNOSIS — D485 Neoplasm of uncertain behavior of skin: Secondary | ICD-10-CM | POA: Diagnosis not present

## 2013-07-31 DIAGNOSIS — Z85828 Personal history of other malignant neoplasm of skin: Secondary | ICD-10-CM | POA: Diagnosis not present

## 2013-09-10 ENCOUNTER — Ambulatory Visit (INDEPENDENT_AMBULATORY_CARE_PROVIDER_SITE_OTHER): Payer: Medicare Other

## 2013-09-10 DIAGNOSIS — Z954 Presence of other heart-valve replacement: Secondary | ICD-10-CM

## 2013-09-10 DIAGNOSIS — I059 Rheumatic mitral valve disease, unspecified: Secondary | ICD-10-CM

## 2013-09-10 LAB — POCT INR: INR: 2.5

## 2013-09-11 ENCOUNTER — Other Ambulatory Visit: Payer: Self-pay | Admitting: Dermatology

## 2013-09-11 DIAGNOSIS — Z85828 Personal history of other malignant neoplasm of skin: Secondary | ICD-10-CM | POA: Diagnosis not present

## 2013-09-11 DIAGNOSIS — L570 Actinic keratosis: Secondary | ICD-10-CM | POA: Diagnosis not present

## 2013-09-11 DIAGNOSIS — C44721 Squamous cell carcinoma of skin of unspecified lower limb, including hip: Secondary | ICD-10-CM | POA: Diagnosis not present

## 2013-09-11 DIAGNOSIS — D485 Neoplasm of uncertain behavior of skin: Secondary | ICD-10-CM | POA: Diagnosis not present

## 2013-09-19 DIAGNOSIS — C44721 Squamous cell carcinoma of skin of unspecified lower limb, including hip: Secondary | ICD-10-CM | POA: Diagnosis not present

## 2013-09-19 DIAGNOSIS — Z85828 Personal history of other malignant neoplasm of skin: Secondary | ICD-10-CM | POA: Diagnosis not present

## 2013-10-08 ENCOUNTER — Ambulatory Visit (INDEPENDENT_AMBULATORY_CARE_PROVIDER_SITE_OTHER): Payer: Medicare Other | Admitting: *Deleted

## 2013-10-08 DIAGNOSIS — Z954 Presence of other heart-valve replacement: Secondary | ICD-10-CM

## 2013-10-08 DIAGNOSIS — I059 Rheumatic mitral valve disease, unspecified: Secondary | ICD-10-CM

## 2013-10-08 LAB — POCT INR: INR: 2.7

## 2013-10-09 DIAGNOSIS — C4492 Squamous cell carcinoma of skin, unspecified: Secondary | ICD-10-CM | POA: Diagnosis not present

## 2013-10-09 DIAGNOSIS — Z85828 Personal history of other malignant neoplasm of skin: Secondary | ICD-10-CM | POA: Diagnosis not present

## 2013-11-21 ENCOUNTER — Encounter: Payer: Self-pay | Admitting: *Deleted

## 2013-11-21 ENCOUNTER — Ambulatory Visit (INDEPENDENT_AMBULATORY_CARE_PROVIDER_SITE_OTHER): Payer: Medicare Other | Admitting: Surgery

## 2013-11-21 DIAGNOSIS — I059 Rheumatic mitral valve disease, unspecified: Secondary | ICD-10-CM | POA: Diagnosis not present

## 2013-11-21 DIAGNOSIS — Z954 Presence of other heart-valve replacement: Secondary | ICD-10-CM | POA: Diagnosis not present

## 2013-11-21 LAB — POCT INR: INR: 2.9

## 2013-11-29 DIAGNOSIS — H02839 Dermatochalasis of unspecified eye, unspecified eyelid: Secondary | ICD-10-CM | POA: Diagnosis not present

## 2013-11-29 DIAGNOSIS — H5210 Myopia, unspecified eye: Secondary | ICD-10-CM | POA: Diagnosis not present

## 2013-11-29 DIAGNOSIS — H04129 Dry eye syndrome of unspecified lacrimal gland: Secondary | ICD-10-CM | POA: Diagnosis not present

## 2013-12-01 ENCOUNTER — Other Ambulatory Visit: Payer: Self-pay | Admitting: Interventional Cardiology

## 2013-12-03 DIAGNOSIS — H1045 Other chronic allergic conjunctivitis: Secondary | ICD-10-CM | POA: Diagnosis not present

## 2013-12-03 DIAGNOSIS — H04129 Dry eye syndrome of unspecified lacrimal gland: Secondary | ICD-10-CM | POA: Diagnosis not present

## 2014-01-02 ENCOUNTER — Ambulatory Visit (INDEPENDENT_AMBULATORY_CARE_PROVIDER_SITE_OTHER): Payer: Medicare Other | Admitting: Pharmacist

## 2014-01-02 DIAGNOSIS — Z954 Presence of other heart-valve replacement: Secondary | ICD-10-CM

## 2014-01-02 DIAGNOSIS — I059 Rheumatic mitral valve disease, unspecified: Secondary | ICD-10-CM | POA: Diagnosis not present

## 2014-01-02 LAB — POCT INR: INR: 3.5

## 2014-01-08 ENCOUNTER — Ambulatory Visit: Payer: Self-pay | Admitting: Interventional Cardiology

## 2014-01-08 ENCOUNTER — Other Ambulatory Visit: Payer: Self-pay | Admitting: Interventional Cardiology

## 2014-01-17 ENCOUNTER — Encounter: Payer: Self-pay | Admitting: Cardiology

## 2014-01-17 DIAGNOSIS — I4819 Other persistent atrial fibrillation: Secondary | ICD-10-CM

## 2014-01-17 DIAGNOSIS — E785 Hyperlipidemia, unspecified: Secondary | ICD-10-CM | POA: Insufficient documentation

## 2014-01-17 DIAGNOSIS — I4811 Longstanding persistent atrial fibrillation: Secondary | ICD-10-CM | POA: Insufficient documentation

## 2014-01-17 DIAGNOSIS — I480 Paroxysmal atrial fibrillation: Secondary | ICD-10-CM

## 2014-01-17 HISTORY — DX: Paroxysmal atrial fibrillation: I48.0

## 2014-01-23 ENCOUNTER — Encounter: Payer: Self-pay | Admitting: Interventional Cardiology

## 2014-01-23 ENCOUNTER — Ambulatory Visit (INDEPENDENT_AMBULATORY_CARE_PROVIDER_SITE_OTHER): Payer: Medicare Other | Admitting: Interventional Cardiology

## 2014-01-23 ENCOUNTER — Ambulatory Visit (INDEPENDENT_AMBULATORY_CARE_PROVIDER_SITE_OTHER): Payer: Medicare Other | Admitting: *Deleted

## 2014-01-23 VITALS — BP 124/78 | HR 69 | Ht 72.0 in | Wt 165.0 lb

## 2014-01-23 DIAGNOSIS — I4891 Unspecified atrial fibrillation: Secondary | ICD-10-CM

## 2014-01-23 DIAGNOSIS — I359 Nonrheumatic aortic valve disorder, unspecified: Secondary | ICD-10-CM

## 2014-01-23 DIAGNOSIS — I059 Rheumatic mitral valve disease, unspecified: Secondary | ICD-10-CM | POA: Diagnosis not present

## 2014-01-23 DIAGNOSIS — I480 Paroxysmal atrial fibrillation: Secondary | ICD-10-CM

## 2014-01-23 DIAGNOSIS — Z954 Presence of other heart-valve replacement: Secondary | ICD-10-CM | POA: Diagnosis not present

## 2014-01-23 DIAGNOSIS — I351 Nonrheumatic aortic (valve) insufficiency: Secondary | ICD-10-CM

## 2014-01-23 DIAGNOSIS — R0602 Shortness of breath: Secondary | ICD-10-CM | POA: Diagnosis not present

## 2014-01-23 LAB — POCT INR: INR: 4.3

## 2014-01-23 NOTE — Patient Instructions (Signed)
Your physician recommends that you continue on your current medications as directed. Please refer to the Current Medication list given to you today.  Your physician wants you to follow-up in: 1 year with Dr. Varanasi. You will receive a reminder letter in the mail two months in advance. If you don't receive a letter, please call our office to schedule the follow-up appointment.  

## 2014-01-23 NOTE — Progress Notes (Signed)
Patient ID: ZALAN SHIDLER, male   DOB: [birth year], 80 y.o.   MRN: [id number]    [place] [place], [place]  [address] Phone: [phone] Fax:  [phone]  Date:  01/23/2014   ID:  ARGYLE GUSTAFSON, DOB [birth year], MRN [id number]  PCP:  Irven Shelling, MD      History of Present Illness: DASHEL GOINES is a 80 y.o. male who has had AFib. He had a hematoma as well , but this is better. he is back on his anticoagulation. He feels that he gets [facility] with exertion and a fatigue. It will last 30 seconds and then improve. He can resume activities after this point. Atrial Fibrillation F/U:  Denies : Chest pain.  Dizziness.  Leg edema.  Orthopnea.  Palpitations.  Syncope.   Recent MOHS surgery on right calf.  Healing well.   Wt Readings from Last 3 Encounters:  01/23/14 165 lb (74.844 kg)     Past Medical History  Diagnosis Date  . PAF (paroxysmal atrial fibrillation) 01/17/2014  . S/P mitral valve replacement     INR goal 2.5-3.5, St Jude, 10/97  . Chronic anticoagulation     systemic  . History of echocardiogram     audible aortic insufficiency on 1/09 echo  . BPH (benign prostatic hypertrophy)   . History of peptic ulcer     remote, 3/95  . Hyperlipidemia   . Bullous pemphigoid     [name], March 2011, right forearm squamous cell carcinoma  . Anemia     leakoppenia  . Colon polyp     transverse, 2002  . Left inguinal hernia     Current Outpatient Prescriptions  Medication Sig Dispense Refill  . bisoprolol (ZEBETA) 5 MG tablet TAKE 1 TABLET ONCE A DAY  90 tablet  0  . calcium carbonate (OS-CAL) 600 MG TABS tablet Take 600 mg by mouth 2 (two) times daily with a meal.      . fluticasone (FLONASE) 50 MCG/ACT nasal spray Place 2 sprays into both nostrils as needed for allergies or rhinitis.      . furosemide (LASIX) 40 MG tablet TAKE 1 TABLET TWICE A DAY  180 tablet  0  . LORazepam (ATIVAN) 1 MG tablet Take 1 mg by mouth every 8  (eight) hours as needed.       . Multiple Vitamins-Minerals (MULTIVITAMIN PO) Take 1 tablet by mouth daily.      Marland Kitchen warfarin (COUMADIN) 5 MG tablet Take 5 mg by mouth as directed. 7.5 on Tuesday and Saturday..       No current facility-administered medications for this visit.    Allergies:    Allergies  Allergen Reactions  . Flexeril [Cyclobenzaprine]     Diarrhea     Social History:  The patient  reports that he has quit smoking. He does not have any smokeless tobacco history on file. He reports that he drinks alcohol.   Family History:  The patient's family history includes COPD in his brother; Cancer in his brother; Hypertension in his mother; Lung cancer in his sister; Lupus in his son; Other in his sister.   ROS:  Please see the history of present illness.  No nausea, vomiting.  No fevers, chills.  No focal weakness.  No dysuria. *  All other systems reviewed and negative.   PHYSICAL EXAM: VS:  BP 124/78  Pulse 69  Ht 6' (1.829 m)  Wt 165 lb (74.844 kg)  BMI 22.37  kg/m2 Well nourished, well developed, in no acute distress HEENT: normal Neck: no JVD, no carotid bruits Cardiac:  normal , crisp S1 click, S2; RRR;  Lungs:  clear to auscultation bilaterally, no wheezing, rhonchi or rales Abd: soft, nontender, no hepatomegaly Ext: no edema Skin: warm and dry Neuro:   no focal abnormalities noted  EKG:  NSR, prolonged PR, NSST     ASSESSMENT AND PLAN:  Atrial fibrillation  Continue Coumadin Tablet, 5MG , TAKE 1 TABLET BY MOUTH EVERY DAY EXCEPT 1 AND 1/2 TABLETS (7.5MG ) ON TUESDAY and SATURDAY, Orally, Once a day Continue Bisoprolol Fumarate Tablet, 5MG , TAKE 1 TABLET ONCE A DAY Notes: Currently in NSR by exam. Not clear that he is having significant PAF either. Coumadin for stroke prevention.    2. Mitral valve disorders  Notes: SBE prophylaxis. Valve appears to be functioning well.    3. SOB  Continue Lasix Tablet, 40 MG, 1 tablet, Orally, twice a day LAB: BNP Notes:  Continue regular exercise. I think it is ok for him to take an extra 40 mg of Lasix in the morning if he thinks his fluid status in increased.  He feels like his shortness of breath has gotten better compared to a year ago.    4.  INR over 4 today. He will eat some extra greens and come back in a few days for a recheck. This is unusual for him. He is usually quite well controlled.          Labs    Lab: BNP  B-NATRIURETIC PEPTIDE in 9/14 129 H 0-100 - pg/mL      5. AI:  Moderate by prior echo.  Signed, Mina Marble, MD, [facility] 01/23/2014 5:04 PM

## 2014-01-31 ENCOUNTER — Encounter: Payer: Self-pay | Admitting: Interventional Cardiology

## 2014-02-06 ENCOUNTER — Ambulatory Visit (INDEPENDENT_AMBULATORY_CARE_PROVIDER_SITE_OTHER): Payer: Medicare Other | Admitting: *Deleted

## 2014-02-06 DIAGNOSIS — Z954 Presence of other heart-valve replacement: Secondary | ICD-10-CM

## 2014-02-06 DIAGNOSIS — I059 Rheumatic mitral valve disease, unspecified: Secondary | ICD-10-CM | POA: Diagnosis not present

## 2014-02-06 LAB — POCT INR: INR: 3.6

## 2014-02-12 ENCOUNTER — Other Ambulatory Visit: Payer: Self-pay | Admitting: Interventional Cardiology

## 2014-02-19 ENCOUNTER — Other Ambulatory Visit: Payer: Self-pay | Admitting: Dermatology

## 2014-02-19 DIAGNOSIS — C44721 Squamous cell carcinoma of skin of unspecified lower limb, including hip: Secondary | ICD-10-CM | POA: Diagnosis not present

## 2014-02-19 DIAGNOSIS — Z85828 Personal history of other malignant neoplasm of skin: Secondary | ICD-10-CM | POA: Diagnosis not present

## 2014-02-19 DIAGNOSIS — C4441 Basal cell carcinoma of skin of scalp and neck: Secondary | ICD-10-CM | POA: Diagnosis not present

## 2014-02-19 DIAGNOSIS — I8310 Varicose veins of unspecified lower extremity with inflammation: Secondary | ICD-10-CM | POA: Diagnosis not present

## 2014-02-20 ENCOUNTER — Ambulatory Visit (INDEPENDENT_AMBULATORY_CARE_PROVIDER_SITE_OTHER): Payer: Medicare Other | Admitting: *Deleted

## 2014-02-20 DIAGNOSIS — Z954 Presence of other heart-valve replacement: Secondary | ICD-10-CM

## 2014-02-20 DIAGNOSIS — I059 Rheumatic mitral valve disease, unspecified: Secondary | ICD-10-CM | POA: Diagnosis not present

## 2014-02-20 LAB — POCT INR: INR: 3.5

## 2014-02-28 ENCOUNTER — Telehealth: Payer: Self-pay | Admitting: Interventional Cardiology

## 2014-02-28 NOTE — Telephone Encounter (Signed)
New message     For Richard Spears Pt said his inr has been elevated.  He is going to have a procedure on his ankle next wed.  Should he have his inr checked again before wed?

## 2014-02-28 NOTE — Telephone Encounter (Signed)
Returned call to pt, pt states he is scheduled for [place]es procedure to remove a squamous cell carcinoma on 03/06/14.  Pt does not need to hold Coumadin prior to procedure, but since INR recently elevated pt would like INR checked prior to procedure.  Made OV to check INR on Tuesday 03/05/14.

## 2014-03-05 ENCOUNTER — Ambulatory Visit (INDEPENDENT_AMBULATORY_CARE_PROVIDER_SITE_OTHER): Payer: Medicare Other | Admitting: Pharmacist

## 2014-03-05 ENCOUNTER — Other Ambulatory Visit: Payer: Self-pay | Admitting: Interventional Cardiology

## 2014-03-05 DIAGNOSIS — I059 Rheumatic mitral valve disease, unspecified: Secondary | ICD-10-CM

## 2014-03-05 DIAGNOSIS — Z954 Presence of other heart-valve replacement: Secondary | ICD-10-CM | POA: Diagnosis not present

## 2014-03-05 LAB — POCT INR: INR: 3.1

## 2014-03-06 ENCOUNTER — Other Ambulatory Visit: Payer: Self-pay | Admitting: Dermatology

## 2014-03-06 DIAGNOSIS — C44721 Squamous cell carcinoma of skin of unspecified lower limb, including hip: Secondary | ICD-10-CM | POA: Diagnosis not present

## 2014-03-06 DIAGNOSIS — Z85828 Personal history of other malignant neoplasm of skin: Secondary | ICD-10-CM | POA: Diagnosis not present

## 2014-03-20 ENCOUNTER — Other Ambulatory Visit: Payer: Self-pay | Admitting: Dermatology

## 2014-03-20 DIAGNOSIS — Z85828 Personal history of other malignant neoplasm of skin: Secondary | ICD-10-CM | POA: Diagnosis not present

## 2014-03-20 DIAGNOSIS — C4441 Basal cell carcinoma of skin of scalp and neck: Secondary | ICD-10-CM | POA: Diagnosis not present

## 2014-03-21 ENCOUNTER — Other Ambulatory Visit: Payer: Self-pay | Admitting: Interventional Cardiology

## 2014-04-03 ENCOUNTER — Ambulatory Visit (INDEPENDENT_AMBULATORY_CARE_PROVIDER_SITE_OTHER): Payer: Medicare Other | Admitting: Pharmacist

## 2014-04-03 DIAGNOSIS — Z952 Presence of prosthetic heart valve: Secondary | ICD-10-CM

## 2014-04-03 DIAGNOSIS — I059 Rheumatic mitral valve disease, unspecified: Secondary | ICD-10-CM | POA: Diagnosis not present

## 2014-04-03 DIAGNOSIS — Z954 Presence of other heart-valve replacement: Secondary | ICD-10-CM | POA: Diagnosis not present

## 2014-04-03 DIAGNOSIS — I4891 Unspecified atrial fibrillation: Secondary | ICD-10-CM | POA: Diagnosis not present

## 2014-04-03 LAB — POCT INR: INR: 3.5

## 2014-04-11 DIAGNOSIS — Z23 Encounter for immunization: Secondary | ICD-10-CM | POA: Diagnosis not present

## 2014-04-11 DIAGNOSIS — Z111 Encounter for screening for respiratory tuberculosis: Secondary | ICD-10-CM | POA: Diagnosis not present

## 2014-04-12 DIAGNOSIS — T814XXA Infection following a procedure, initial encounter: Secondary | ICD-10-CM | POA: Diagnosis not present

## 2014-04-12 DIAGNOSIS — Z85828 Personal history of other malignant neoplasm of skin: Secondary | ICD-10-CM | POA: Diagnosis not present

## 2014-04-15 ENCOUNTER — Telehealth: Payer: Self-pay | Admitting: Interventional Cardiology

## 2014-04-15 ENCOUNTER — Encounter: Payer: Self-pay | Admitting: *Deleted

## 2014-04-15 NOTE — Telephone Encounter (Signed)
Spoke with pt in regards to new ABX-Cipro 500mg s BID for 10 days that he's starting tonight, appt moved from Wed to Fri. Pt aware to keep diet consist and keep appt. Due to interaction potential.

## 2014-04-15 NOTE — Telephone Encounter (Signed)
Will forward to CVRR. 

## 2014-04-15 NOTE — Telephone Encounter (Signed)
New message           Pt would like to know if medication cipro will effect his INR

## 2014-04-19 ENCOUNTER — Ambulatory Visit (INDEPENDENT_AMBULATORY_CARE_PROVIDER_SITE_OTHER): Payer: Medicare Other

## 2014-04-19 DIAGNOSIS — I059 Rheumatic mitral valve disease, unspecified: Secondary | ICD-10-CM

## 2014-04-19 DIAGNOSIS — Z954 Presence of other heart-valve replacement: Secondary | ICD-10-CM | POA: Diagnosis not present

## 2014-04-19 DIAGNOSIS — Z952 Presence of prosthetic heart valve: Secondary | ICD-10-CM

## 2014-04-19 DIAGNOSIS — I4891 Unspecified atrial fibrillation: Secondary | ICD-10-CM

## 2014-04-19 LAB — POCT INR: INR: 3.1

## 2014-05-10 ENCOUNTER — Ambulatory Visit (INDEPENDENT_AMBULATORY_CARE_PROVIDER_SITE_OTHER): Payer: Medicare Other | Admitting: *Deleted

## 2014-05-10 DIAGNOSIS — I4891 Unspecified atrial fibrillation: Secondary | ICD-10-CM | POA: Diagnosis not present

## 2014-05-10 DIAGNOSIS — I059 Rheumatic mitral valve disease, unspecified: Secondary | ICD-10-CM | POA: Diagnosis not present

## 2014-05-10 DIAGNOSIS — Z952 Presence of prosthetic heart valve: Secondary | ICD-10-CM

## 2014-05-10 DIAGNOSIS — Z954 Presence of other heart-valve replacement: Secondary | ICD-10-CM | POA: Diagnosis not present

## 2014-05-10 LAB — POCT INR: INR: 3.2

## 2014-05-14 DIAGNOSIS — L0889 Other specified local infections of the skin and subcutaneous tissue: Secondary | ICD-10-CM | POA: Diagnosis not present

## 2014-05-16 ENCOUNTER — Other Ambulatory Visit: Payer: Self-pay | Admitting: Interventional Cardiology

## 2014-05-20 ENCOUNTER — Telehealth: Payer: Self-pay | Admitting: Interventional Cardiology

## 2014-05-20 NOTE — Telephone Encounter (Signed)
Spoke with  Pt and he started Septra DS BID on Friday with first dose in PM will be on med for 10 days, Thus appt scheduled for tomorrow.

## 2014-05-20 NOTE — Telephone Encounter (Signed)
New msg   Patient calling stating he is taking a new medication, a generic for Ceptra DS, and would like a call back to know if he should be taking it. Prescribed to him by [facility]. [phone]

## 2014-05-20 NOTE — Telephone Encounter (Signed)
Will forward to CVRR as the patient is on coumadin.

## 2014-05-21 ENCOUNTER — Ambulatory Visit (INDEPENDENT_AMBULATORY_CARE_PROVIDER_SITE_OTHER): Payer: Medicare Other | Admitting: Pharmacist Clinician (PhC)/ Clinical Pharmacy Specialist

## 2014-05-21 DIAGNOSIS — I059 Rheumatic mitral valve disease, unspecified: Secondary | ICD-10-CM

## 2014-05-21 DIAGNOSIS — I4891 Unspecified atrial fibrillation: Secondary | ICD-10-CM | POA: Diagnosis not present

## 2014-05-21 DIAGNOSIS — Z954 Presence of other heart-valve replacement: Secondary | ICD-10-CM

## 2014-05-21 DIAGNOSIS — Z952 Presence of prosthetic heart valve: Secondary | ICD-10-CM

## 2014-05-21 LAB — POCT INR: INR: 4.9

## 2014-05-31 ENCOUNTER — Ambulatory Visit (INDEPENDENT_AMBULATORY_CARE_PROVIDER_SITE_OTHER): Payer: Medicare Other | Admitting: *Deleted

## 2014-05-31 DIAGNOSIS — I4891 Unspecified atrial fibrillation: Secondary | ICD-10-CM

## 2014-05-31 DIAGNOSIS — Z954 Presence of other heart-valve replacement: Secondary | ICD-10-CM

## 2014-05-31 DIAGNOSIS — I059 Rheumatic mitral valve disease, unspecified: Secondary | ICD-10-CM | POA: Diagnosis not present

## 2014-05-31 DIAGNOSIS — Z952 Presence of prosthetic heart valve: Secondary | ICD-10-CM

## 2014-05-31 LAB — POCT INR: INR: 2.7

## 2014-06-10 DIAGNOSIS — I351 Nonrheumatic aortic (valve) insufficiency: Secondary | ICD-10-CM | POA: Diagnosis not present

## 2014-06-10 DIAGNOSIS — Z5181 Encounter for therapeutic drug level monitoring: Secondary | ICD-10-CM | POA: Diagnosis not present

## 2014-06-10 DIAGNOSIS — Z952 Presence of prosthetic heart valve: Secondary | ICD-10-CM | POA: Diagnosis not present

## 2014-06-10 DIAGNOSIS — Z1389 Encounter for screening for other disorder: Secondary | ICD-10-CM | POA: Diagnosis not present

## 2014-06-10 DIAGNOSIS — D649 Anemia, unspecified: Secondary | ICD-10-CM | POA: Diagnosis not present

## 2014-06-10 DIAGNOSIS — I340 Nonrheumatic mitral (valve) insufficiency: Secondary | ICD-10-CM | POA: Diagnosis not present

## 2014-06-10 DIAGNOSIS — N400 Enlarged prostate without lower urinary tract symptoms: Secondary | ICD-10-CM | POA: Diagnosis not present

## 2014-06-10 DIAGNOSIS — D72819 Decreased white blood cell count, unspecified: Secondary | ICD-10-CM | POA: Diagnosis not present

## 2014-06-10 DIAGNOSIS — Z23 Encounter for immunization: Secondary | ICD-10-CM | POA: Diagnosis not present

## 2014-06-14 ENCOUNTER — Ambulatory Visit (INDEPENDENT_AMBULATORY_CARE_PROVIDER_SITE_OTHER): Payer: Medicare Other | Admitting: Pharmacist

## 2014-06-14 DIAGNOSIS — I059 Rheumatic mitral valve disease, unspecified: Secondary | ICD-10-CM | POA: Diagnosis not present

## 2014-06-14 DIAGNOSIS — Z954 Presence of other heart-valve replacement: Secondary | ICD-10-CM | POA: Diagnosis not present

## 2014-06-14 DIAGNOSIS — Z952 Presence of prosthetic heart valve: Secondary | ICD-10-CM

## 2014-06-14 DIAGNOSIS — I4891 Unspecified atrial fibrillation: Secondary | ICD-10-CM | POA: Diagnosis not present

## 2014-06-14 LAB — POCT INR: INR: 3

## 2014-07-05 ENCOUNTER — Ambulatory Visit (INDEPENDENT_AMBULATORY_CARE_PROVIDER_SITE_OTHER): Payer: Medicare Other | Admitting: *Deleted

## 2014-07-05 DIAGNOSIS — I4891 Unspecified atrial fibrillation: Secondary | ICD-10-CM | POA: Diagnosis not present

## 2014-07-05 DIAGNOSIS — Z954 Presence of other heart-valve replacement: Secondary | ICD-10-CM | POA: Diagnosis not present

## 2014-07-05 DIAGNOSIS — I059 Rheumatic mitral valve disease, unspecified: Secondary | ICD-10-CM

## 2014-07-05 DIAGNOSIS — Z952 Presence of prosthetic heart valve: Secondary | ICD-10-CM

## 2014-07-05 LAB — POCT INR: INR: 2.7

## 2014-08-02 ENCOUNTER — Ambulatory Visit (INDEPENDENT_AMBULATORY_CARE_PROVIDER_SITE_OTHER): Payer: Medicare Other

## 2014-08-02 DIAGNOSIS — I059 Rheumatic mitral valve disease, unspecified: Secondary | ICD-10-CM | POA: Diagnosis not present

## 2014-08-02 DIAGNOSIS — Z954 Presence of other heart-valve replacement: Secondary | ICD-10-CM

## 2014-08-02 DIAGNOSIS — Z952 Presence of prosthetic heart valve: Secondary | ICD-10-CM

## 2014-08-02 DIAGNOSIS — I4891 Unspecified atrial fibrillation: Secondary | ICD-10-CM

## 2014-08-02 LAB — POCT INR: INR: 2.5

## 2014-08-10 ENCOUNTER — Other Ambulatory Visit: Payer: Self-pay | Admitting: Interventional Cardiology

## 2014-08-28 ENCOUNTER — Ambulatory Visit (INDEPENDENT_AMBULATORY_CARE_PROVIDER_SITE_OTHER): Payer: Medicare Other | Admitting: Pharmacist

## 2014-08-28 DIAGNOSIS — I4891 Unspecified atrial fibrillation: Secondary | ICD-10-CM

## 2014-08-28 DIAGNOSIS — I059 Rheumatic mitral valve disease, unspecified: Secondary | ICD-10-CM

## 2014-08-28 DIAGNOSIS — Z952 Presence of prosthetic heart valve: Secondary | ICD-10-CM

## 2014-08-28 DIAGNOSIS — Z954 Presence of other heart-valve replacement: Secondary | ICD-10-CM | POA: Diagnosis not present

## 2014-08-28 LAB — POCT INR: INR: 2.6

## 2014-10-09 ENCOUNTER — Ambulatory Visit (INDEPENDENT_AMBULATORY_CARE_PROVIDER_SITE_OTHER): Payer: Medicare Other | Admitting: *Deleted

## 2014-10-09 DIAGNOSIS — I4891 Unspecified atrial fibrillation: Secondary | ICD-10-CM

## 2014-10-09 DIAGNOSIS — Z954 Presence of other heart-valve replacement: Secondary | ICD-10-CM

## 2014-10-09 DIAGNOSIS — Z952 Presence of prosthetic heart valve: Secondary | ICD-10-CM

## 2014-10-09 DIAGNOSIS — I059 Rheumatic mitral valve disease, unspecified: Secondary | ICD-10-CM

## 2014-10-09 LAB — POCT INR: INR: 2.5

## 2014-10-15 ENCOUNTER — Other Ambulatory Visit: Payer: Self-pay | Admitting: Dermatology

## 2014-10-15 DIAGNOSIS — D485 Neoplasm of uncertain behavior of skin: Secondary | ICD-10-CM | POA: Diagnosis not present

## 2014-10-15 DIAGNOSIS — C44722 Squamous cell carcinoma of skin of right lower limb, including hip: Secondary | ICD-10-CM | POA: Diagnosis not present

## 2014-10-15 DIAGNOSIS — D0471 Carcinoma in situ of skin of right lower limb, including hip: Secondary | ICD-10-CM

## 2014-10-15 DIAGNOSIS — C44729 Squamous cell carcinoma of skin of left lower limb, including hip: Secondary | ICD-10-CM | POA: Diagnosis not present

## 2014-10-15 DIAGNOSIS — Z85828 Personal history of other malignant neoplasm of skin: Secondary | ICD-10-CM | POA: Diagnosis not present

## 2014-10-15 HISTORY — DX: Carcinoma in situ of skin of right lower limb, including hip: D04.71

## 2014-10-25 DIAGNOSIS — C44520 Squamous cell carcinoma of anal skin: Secondary | ICD-10-CM | POA: Diagnosis not present

## 2014-10-30 ENCOUNTER — Encounter: Payer: Self-pay | Admitting: Radiation Oncology

## 2014-10-30 NOTE — Progress Notes (Addendum)
Histology and Location of Primary Skin Cancer: Well differentiated Squamous Cell Carcinoma of the Right Inferior, Medial, anterior tibial  10/15/14 Diagnosis Skin Biopsy-(P), (A) right inferior medial anterior tibial, shave WELL DIFFERENTIATED SQUAMOUS CELL CARCINOMA  Kani A Garman noted a 3-4 week onset of an enlarging lesion on his right tibia - changes in size associated with tenderness and soreness.  Denied any injury.  11 mmm crusted erythematous papule located on his right inferior medial anterior tibial  Past/Anticipated interventions by patient's surgeon/dermatologist for current problematic lesion, if any: Dr. Quillian Quince B. Jones- Electrodesiccation and Curettage and Shave Biopsy [Other] - 10/15/14  Past skin cancers, if any:  See Note from [facility]: 19 prior Skin Cancers (various places) spanning 08/15/2001 - 03/20/2014  History of Blistering sunburns, if any: "History of Significant sun exposure". Does not sunbath/use tanning bed presently - reports 1 bad sunburn 50 years ago on his lower legs  SAFETY ISSUES:  Prior radiation? No  Pacemaker/ICD? No  Possible current pregnancy? N/A  Is the patient on methotrexate? No  Current Complaints / other details:  Patient is here with his wife.  He has venous insufficiency in his lower legs causing purple appearance.  He has a small band aid over the biopsy area on his right lower leg.  BP 139/70 mmHg  Pulse 70  Temp(Src) 97.8 F (36.6 C) (Oral)  Resp 12  Ht 6' (1.829 m)  Wt 166 lb 11.2 oz (75.615 kg)  BMI 22.60 kg/m2  SpO2 98%

## 2014-10-31 ENCOUNTER — Ambulatory Visit
Admission: RE | Admit: 2014-10-31 | Discharge: 2014-10-31 | Disposition: A | Discharge status: Home / Self Care | Payer: Medicare Other | Source: Ambulatory Visit | Attending: Radiation Oncology | Admitting: Radiation Oncology

## 2014-10-31 ENCOUNTER — Encounter: Payer: Self-pay | Admitting: Radiation Oncology

## 2014-10-31 VITALS — BP 139/70 | HR 70 | Temp 97.8°F | Resp 12 | Ht 72.0 in | Wt 166.7 lb

## 2014-10-31 DIAGNOSIS — C44722 Squamous cell carcinoma of skin of right lower limb, including hip: Secondary | ICD-10-CM

## 2014-10-31 DIAGNOSIS — C44702 Unspecified malignant neoplasm of skin of right lower limb, including hip: Secondary | ICD-10-CM

## 2014-10-31 HISTORY — DX: Personal history of other malignant neoplasm of skin: Z85.828

## 2014-10-31 HISTORY — DX: Personal history of diseases of the skin and subcutaneous tissue: Z87.2

## 2014-10-31 HISTORY — DX: Carcinoma in situ of skin of right lower limb, including hip: D04.71

## 2014-10-31 NOTE — Progress Notes (Signed)
[facility] NEW PATIENT EVALUATION  Name: Richard Spears MRN: [id number]  Date:   10/31/2014           DOB: 15-Jun-1926  Status: outpatient   CC: Irven Shelling, MD  Danella Sensing, MD Dr. Griselda Miner   REFERRING PHYSICIAN: Danella Sensing, MD   DIAGNOSIS: Squamous cell carcinoma of the right lower leg   HISTORY OF PRESENT ILLNESS:  Richard Spears is a 81 y.o. male who is seen today through the courtesy of Dr. [name] for discussion of radiation therapy as an alternative to surgery in the management of his biopsy-proven squamous cell carcinoma of the skin arising along the right lower leg.  He states that he presented with a 3 to 4 week history of a lesion along his distal right lower leg.  He denies trauma.  He was seen by Dr. [name] who noted a lesion along the medial distal anterior tibia.  This measured approximately 1.1 cm.  He performed a shave biopsy on 10/15/2014 and this was diagnostic for well-differentiated squamous cell carcinoma.  Initially, he was reluctant to consider  further surgery and wanted to explore the possibility of radiation therapy.  Of note is that he did have Mohs surgery along his left ankle last September, and he did well with this.  He has a history of multiple squamous cell carcinomas and basal cell carcinomas of the skin arising from a head and neck, trunk, and both upper and lower extremities.  He is on chronic anticoagulation with Coumadin, having had a heart valve replacement.  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     PAST SURGICAL HISTORY:  Past Surgical History  Procedure Laterality Date  . Hip replacements Left     10 years ago  . Heart valves    . Electrodesiccation and curettage and shave biopsy Right     Right medial, anterio tibia: Well differentiated Squamous Cell     FAMILY HISTORY: family history includes COPD in his brother; Cancer in his brother; Hypertension in his mother; Lung cancer in his sister; Lupus in his son; Other in his sister.    SOCIAL HISTORY:  reports that he quit smoking about 60 years ago. His smoking use included Cigarettes. He has a 8 pack-year smoking history. He has never used smokeless tobacco. He reports that he drinks alcohol.  Married, 2 sons.  Retired Civil Service fast streamer for [employer].   ALLERGIES: Flexeril   MEDICATIONS:  Current Outpatient Prescriptions  Medication Sig Dispense Refill  . bisoprolol (ZEBETA) 5 MG tablet TAKE 1 TABLET DAILY 90 tablet 0  . calcium carbonate (OS-CAL) 600 MG TABS tablet Take 600 mg by mouth 2 (two) times daily with a meal.    . COUMADIN 5 MG tablet TAKE ONE TO ONE AND ONE-HALF TABLETS (5 TO 7.5 MG TOTAL) DAILY AS DIRECTED 120 tablet 1  . fluticasone (FLONASE) 50 MCG/ACT nasal spray Place 2 sprays into both nostrils as needed for allergies or rhinitis.    . furosemide (LASIX) 40 MG tablet TAKE 1 TABLET TWICE A DAY 180 tablet 1  . ketoconazole (  NIZORAL) 2 % shampoo Apply 1 application topically 2 (two) times a week.    Marland Kitchen LORazepam (ATIVAN) 1 MG tablet Take 1 mg by mouth every 8 (eight) hours as needed.     . Multiple Vitamins-Minerals (MULTIVITAMIN PO) Take 1 tablet by mouth daily.    . mupirocin cream (BACTROBAN) 2 % Apply 1 application topically 2 (two) times daily.     No current facility-administered medications for this encounter.     REVIEW OF SYSTEMS:  Pertinent items are noted in HPI.    PHYSICAL EXAM:  height is 6' (1.829 m) and weight is 166  lb 11.2 oz (75.615 kg). His oral temperature is 97.8 F (36.6 C). His blood pressure is 139/70 and his pulse is 70. His respiration is 12 and oxygen saturation is 98%.   Examination Limited to the distal right lower extremity.  There is a 1.3 x 1.3 cm raised and centrally ulcerated lesion along the left lower extremity, medial distal anterior tibia just medial to the bone.  He does have venous stasis changes along both lower extremities.  There is 1-2+ ankle edema.   LABORATORY DATA:  Lab Results  Component Value Date   WBC 2.2* 01/17/2007   HGB 13.2 01/17/2007   HCT 38.1* 01/17/2007   MCV 94.3 01/17/2007   PLT 174 01/17/2007   Lab Results  Component Value Date   NA 137 01/17/2007   K 4.4 01/17/2007   CL 100 01/17/2007   CO2 29 01/17/2007   Lab Results  Component Value Date   ALT 18 01/17/2007   AST 30 01/17/2007   ALKPHOS 49 01/17/2007   BILITOT 1.1 01/17/2007      IMPRESSION: Squamous cell carcinoma the skin arising from the distal right lower extremity.  I explained to the patient that his management options include reexcision versus electron beam radiation therapy.  He did well with his last surgery along his left ankle.  At first he was reluctant to consider further surgery, but he is now more interested in pursuing surgery.  I explained to him that surgery had a slightly better overall cure rate, and he would not have to worry as much about delayed healing.  From a treatment standpoint, he will require 10 fractions of radiation therapy over 2 weeks if he wanted to consider radiation therapy.  We discussed the potential acute and late toxicities of radiation therapy.  He is now ready to pursue surgery, and he will have Dr. Ronnald Ramp refer him back to Dr. Sarajane Jews.   PLAN: As discussed above.  He plans to pursue surgical excision which is I think a better option for him.   I spent 20  minutes face to face with the patient and more than 50% of that time was spent in counseling  and/or coordination of care.

## 2014-10-31 NOTE — Progress Notes (Signed)
Please see the Nurse Progress Note in the MD Initial Consult Encounter for this patient. 

## 2014-11-06 DIAGNOSIS — D485 Neoplasm of uncertain behavior of skin: Secondary | ICD-10-CM | POA: Diagnosis not present

## 2014-11-06 DIAGNOSIS — I872 Venous insufficiency (chronic) (peripheral): Secondary | ICD-10-CM | POA: Diagnosis not present

## 2014-11-06 DIAGNOSIS — I8311 Varicose veins of right lower extremity with inflammation: Secondary | ICD-10-CM | POA: Diagnosis not present

## 2014-11-06 DIAGNOSIS — I8312 Varicose veins of left lower extremity with inflammation: Secondary | ICD-10-CM | POA: Diagnosis not present

## 2014-11-06 DIAGNOSIS — Z85828 Personal history of other malignant neoplasm of skin: Secondary | ICD-10-CM | POA: Diagnosis not present

## 2014-11-09 ENCOUNTER — Other Ambulatory Visit: Payer: Self-pay | Admitting: Adult Health

## 2014-11-17 ENCOUNTER — Other Ambulatory Visit: Payer: Self-pay | Admitting: Interventional Cardiology

## 2014-11-20 ENCOUNTER — Ambulatory Visit (INDEPENDENT_AMBULATORY_CARE_PROVIDER_SITE_OTHER): Payer: Medicare Other | Admitting: *Deleted

## 2014-11-20 DIAGNOSIS — Z954 Presence of other heart-valve replacement: Secondary | ICD-10-CM

## 2014-11-20 DIAGNOSIS — I4891 Unspecified atrial fibrillation: Secondary | ICD-10-CM

## 2014-11-20 DIAGNOSIS — I059 Rheumatic mitral valve disease, unspecified: Secondary | ICD-10-CM | POA: Diagnosis not present

## 2014-11-20 DIAGNOSIS — Z952 Presence of prosthetic heart valve: Secondary | ICD-10-CM

## 2014-11-20 LAB — POCT INR: INR: 3

## 2014-11-28 DIAGNOSIS — Z85828 Personal history of other malignant neoplasm of skin: Secondary | ICD-10-CM | POA: Diagnosis not present

## 2014-11-28 DIAGNOSIS — C44722 Squamous cell carcinoma of skin of right lower limb, including hip: Secondary | ICD-10-CM | POA: Diagnosis not present

## 2014-11-30 ENCOUNTER — Other Ambulatory Visit: Payer: Self-pay | Admitting: Interventional Cardiology

## 2014-12-05 DIAGNOSIS — H3531 Nonexudative age-related macular degeneration: Secondary | ICD-10-CM | POA: Diagnosis not present

## 2014-12-05 DIAGNOSIS — Z961 Presence of intraocular lens: Secondary | ICD-10-CM | POA: Diagnosis not present

## 2014-12-05 DIAGNOSIS — H04123 Dry eye syndrome of bilateral lacrimal glands: Secondary | ICD-10-CM | POA: Diagnosis not present

## 2014-12-05 DIAGNOSIS — H524 Presbyopia: Secondary | ICD-10-CM | POA: Diagnosis not present

## 2014-12-12 DIAGNOSIS — C44722 Squamous cell carcinoma of skin of right lower limb, including hip: Secondary | ICD-10-CM | POA: Diagnosis not present

## 2014-12-12 DIAGNOSIS — Z4802 Encounter for removal of sutures: Secondary | ICD-10-CM | POA: Diagnosis not present

## 2014-12-23 ENCOUNTER — Other Ambulatory Visit: Payer: Self-pay

## 2014-12-23 ENCOUNTER — Encounter: Payer: Self-pay | Admitting: Interventional Cardiology

## 2015-01-01 ENCOUNTER — Ambulatory Visit (INDEPENDENT_AMBULATORY_CARE_PROVIDER_SITE_OTHER): Payer: Medicare Other | Admitting: *Deleted

## 2015-01-01 DIAGNOSIS — Z952 Presence of prosthetic heart valve: Secondary | ICD-10-CM

## 2015-01-01 DIAGNOSIS — Z954 Presence of other heart-valve replacement: Secondary | ICD-10-CM | POA: Diagnosis not present

## 2015-01-01 DIAGNOSIS — I059 Rheumatic mitral valve disease, unspecified: Secondary | ICD-10-CM | POA: Diagnosis not present

## 2015-01-01 DIAGNOSIS — I4891 Unspecified atrial fibrillation: Secondary | ICD-10-CM | POA: Diagnosis not present

## 2015-01-01 LAB — POCT INR: INR: 3.8

## 2015-01-06 ENCOUNTER — Other Ambulatory Visit: Payer: Self-pay

## 2015-01-06 ENCOUNTER — Other Ambulatory Visit: Payer: Self-pay | Admitting: Interventional Cardiology

## 2015-01-06 MED ORDER — FUROSEMIDE 40 MG PO TABS: 40.0000 mg | ORAL_TABLET | Freq: Two times a day (BID) | ORAL | Status: DC

## 2015-01-29 ENCOUNTER — Ambulatory Visit (INDEPENDENT_AMBULATORY_CARE_PROVIDER_SITE_OTHER): Payer: Medicare Other | Admitting: *Deleted

## 2015-01-29 DIAGNOSIS — I059 Rheumatic mitral valve disease, unspecified: Secondary | ICD-10-CM

## 2015-01-29 DIAGNOSIS — Z954 Presence of other heart-valve replacement: Secondary | ICD-10-CM

## 2015-01-29 DIAGNOSIS — I4891 Unspecified atrial fibrillation: Secondary | ICD-10-CM

## 2015-01-29 DIAGNOSIS — Z952 Presence of prosthetic heart valve: Secondary | ICD-10-CM

## 2015-01-29 LAB — POCT INR: INR: 3.5

## 2015-02-08 ENCOUNTER — Other Ambulatory Visit: Payer: Self-pay | Admitting: Interventional Cardiology

## 2015-02-14 ENCOUNTER — Ambulatory Visit (INDEPENDENT_AMBULATORY_CARE_PROVIDER_SITE_OTHER): Payer: Medicare Other | Admitting: Interventional Cardiology

## 2015-02-14 ENCOUNTER — Other Ambulatory Visit: Payer: Self-pay | Admitting: Interventional Cardiology

## 2015-02-14 ENCOUNTER — Encounter: Payer: Self-pay | Admitting: Interventional Cardiology

## 2015-02-14 VITALS — BP 118/62 | HR 67 | Ht 72.0 in | Wt 164.8 lb

## 2015-02-14 DIAGNOSIS — I4891 Unspecified atrial fibrillation: Secondary | ICD-10-CM | POA: Diagnosis not present

## 2015-02-14 DIAGNOSIS — I351 Nonrheumatic aortic (valve) insufficiency: Secondary | ICD-10-CM | POA: Diagnosis not present

## 2015-02-14 DIAGNOSIS — I872 Venous insufficiency (chronic) (peripheral): Secondary | ICD-10-CM | POA: Diagnosis not present

## 2015-02-14 DIAGNOSIS — I059 Rheumatic mitral valve disease, unspecified: Secondary | ICD-10-CM

## 2015-02-14 NOTE — Progress Notes (Signed)
Patient ID: Richard Spears, male   DOB: Aug 27, 1925, 81 y.o.   MRN: [id number]     Cardiology Office Note   Date:  02/14/2015   ID:  Richard Spears, DOB 1925-12-14, MRN [id number]  PCP:  Irven Shelling, MD    No chief complaint on file. AFib   Wt Readings from Last 3 Encounters:  02/14/15 164 lb 12.8 oz (74.753 kg)  10/31/14 166 lb 11.2 oz (75.615 kg)  01/23/14 165 lb (74.844 kg)       History of Present Illness: Richard Spears is a 81 y.o. male   who has had AFib and MV replacement (no CABG at the time of surgery). He is not having problems on his anticoagulation. He denies [facility] with exertion. He walks on a treadmill several times a week for exercise.  He moved recently to [facility].  His wife also has fast heart rates.  Atrial Fibrillation F/U:  Denies : Chest pain.  Dizziness.  Leg edema.  Orthopnea.  Palpitations.  Syncope.   Another MOHS surgery on calf since the last visit. Takes a long time to heal.    Past Medical History  Diagnosis Date  . PAF (paroxysmal atrial fibrillation) 01/17/2014  . S/P mitral valve replacement     INR goal 2.5-3.5, St Jude, 10/97  . Chronic anticoagulation     systemic  . History of echocardiogram     audible aortic insufficiency on 1/09 echo  . BPH (benign prostatic hypertrophy)   . History of peptic ulcer     remote, 3/95  . Hyperlipidemia   . Bullous pemphigoid     [name], March 2011, right forearm squamous cell carcinoma  . Anemia     leakoppenia  . Colon polyp     transverse, 2002  . Left inguinal hernia   . 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 Hx of actinic keratosis   . Hx of squamous cell carcinoma of skin   . Hx of basal cell carcinoma     Past Surgical History  Procedure Laterality Date  . Hip replacements Left     10 years ago  . Heart valves    . Electrodesiccation and curettage and shave biopsy Right     Right medial, anterio tibia: Well  differentiated Squamous Cell     Current Outpatient Prescriptions  Medication Sig Dispense Refill  . bisoprolol (ZEBETA) 5 MG tablet TAKE 1 TABLET DAILY (PLEASE CALL AND SCHEDULE AN APPOINTMENT) 90 tablet 0  . calcium carbonate (OS-CAL) 600 MG TABS tablet Take 300 mg by mouth 2 (two) times daily with a meal.     . COUMADIN 5 MG tablet TAKE AS DIRECTED BY COUMADIN CLINIC 120 tablet 1  . fluticasone (FLONASE) 50 MCG/ACT nasal spray Place 2 sprays into both nostrils as needed for allergies or rhinitis.    . furosemide (LASIX) 40 MG tablet Take 1 tablet (40 mg total) by mouth 2 (two) times daily. 60 tablet 1  . ketoconazole (NIZORAL) 2 % shampoo Apply 1 application topically 2 (two) times a week.    Marland Kitchen LORazepam (ATIVAN) 1 MG tablet Take 1 mg by mouth every 8 (eight) hours as needed for anxiety.     . Multiple Vitamins-Minerals (MULTIVITAMIN PO) Take 1 tablet by mouth daily.     No current facility-administered medications for this visit.    Allergies:   Flexeril    Social History:  The  patient  reports that he quit smoking about 60 years ago. His smoking use included Cigarettes. He has a 8 pack-year smoking history. He has never used smokeless tobacco. He reports that he drinks alcohol.   Family History:  The patient's family history includes COPD in his brother; Cancer in his brother; Hypertension in his mother; Lung cancer in his sister; Lupus in his son; Other in his sister.    ROS:  Please see the history of present illness.   Otherwise, review of systems are positive for long duration of time to heal skin.   All other systems are reviewed and negative.    PHYSICAL EXAM: VS:  BP 118/62 mmHg  Pulse 67  Ht 6' (1.829 m)  Wt 164 lb 12.8 oz (74.753 kg)  BMI 22.35 kg/m2 , BMI Body mass index is 22.35 kg/(m^2). GEN: Well nourished, well developed, in no acute distress HEENT: normal Neck: no JVD, carotid bruits, or masses Cardiac: RRR; no murmurs, rubs, or gallops,no edema , crisp S1  click Respiratory:  clear to auscultation bilaterally, normal work of breathing GI: soft, nontender, nondistended, + BS MS: no deformity or atrophy Skin: warm and dry, legs discolored with a bandage and scab on the right leg Neuro:  Strength and sensation are intact Psych: euthymic mood, full affect   EKG:   The ekg ordered today demonstrates NSR, sinus arrhythmia   Recent Labs: No results found for requested labs within last 365 days.   Lipid Panel No results found for: CHOL, TRIG, HDL, CHOLHDL, VLDL, LDLCALC, LDLDIRECT   Other studies Reviewed: Additional studies/ records that were reviewed today with results demonstrating: 2012 echo reviewed, well functioning valve.   ASSESSMENT AND PLAN:  1. AFib: No palpitations.  Coumadin for stroke prevention.  Using Zebeta and tolerating well.   2. Mitral valve replacement: Valve appears to be functioning well.  3. AI: moderate. 4. Mild orthostatic dizziness: Change position slowly. 5. Venous insufficiency: He has talked to a [employer] supply and he is requesting a prescription.  Can try knee high 20-30 mm Hg stockings.   Current medicines are reviewed at length with the patient today.  The patient concerns regarding his medicines were addressed.  The following changes have been made:  No change   Labs/ tests ordered today include:   Orders Placed This Encounter  Procedures  . EKG 12-Lead    Recommend 150 minutes/week of aerobic exercise Low fat, low carb, high fiber diet recommended  Disposition:   FU in 1 year   Teresita Madura., MD  02/14/2015 5:47 PM    [facility] [address], [place], [place]  [address] Phone: [phone]; Fax: [phone]

## 2015-02-14 NOTE — Patient Instructions (Signed)
**Note De-identified Richard Spears Obfuscation** Medication Instructions:  Same-no changes  Labwork: None  Testing/Procedures: None  Follow-Up: Your physician wants you to follow-up in: 1 year. You will receive a reminder letter in the mail two months in advance. If you don't receive a letter, please call our office to schedule the follow-up appointment.      

## 2015-02-24 ENCOUNTER — Ambulatory Visit (INDEPENDENT_AMBULATORY_CARE_PROVIDER_SITE_OTHER): Payer: Medicare Other | Admitting: Pharmacist

## 2015-02-24 DIAGNOSIS — I4891 Unspecified atrial fibrillation: Secondary | ICD-10-CM | POA: Diagnosis not present

## 2015-02-24 DIAGNOSIS — I059 Rheumatic mitral valve disease, unspecified: Secondary | ICD-10-CM | POA: Diagnosis not present

## 2015-02-24 DIAGNOSIS — Z952 Presence of prosthetic heart valve: Secondary | ICD-10-CM

## 2015-02-24 DIAGNOSIS — Z954 Presence of other heart-valve replacement: Secondary | ICD-10-CM | POA: Diagnosis not present

## 2015-02-24 LAB — POCT INR: INR: 3.7

## 2015-03-21 DIAGNOSIS — M6281 Muscle weakness (generalized): Secondary | ICD-10-CM | POA: Diagnosis not present

## 2015-03-23 ENCOUNTER — Other Ambulatory Visit: Payer: Self-pay | Admitting: Interventional Cardiology

## 2015-03-23 ENCOUNTER — Encounter: Payer: Self-pay | Admitting: Interventional Cardiology

## 2015-03-24 ENCOUNTER — Ambulatory Visit (INDEPENDENT_AMBULATORY_CARE_PROVIDER_SITE_OTHER): Payer: Medicare Other | Admitting: *Deleted

## 2015-03-24 DIAGNOSIS — I059 Rheumatic mitral valve disease, unspecified: Secondary | ICD-10-CM

## 2015-03-24 DIAGNOSIS — I4891 Unspecified atrial fibrillation: Secondary | ICD-10-CM | POA: Diagnosis not present

## 2015-03-24 DIAGNOSIS — Z954 Presence of other heart-valve replacement: Secondary | ICD-10-CM

## 2015-03-24 DIAGNOSIS — Z952 Presence of prosthetic heart valve: Secondary | ICD-10-CM

## 2015-03-24 LAB — POCT INR: INR: 3.6

## 2015-03-25 ENCOUNTER — Other Ambulatory Visit: Payer: Self-pay

## 2015-03-25 MED ORDER — FUROSEMIDE 40 MG PO TABS: 40.0000 mg | ORAL_TABLET | Freq: Two times a day (BID) | ORAL | Status: DC

## 2015-03-27 ENCOUNTER — Telehealth: Payer: Self-pay | Admitting: Interventional Cardiology

## 2015-03-27 DIAGNOSIS — Z23 Encounter for immunization: Secondary | ICD-10-CM | POA: Diagnosis not present

## 2015-03-27 NOTE — Telephone Encounter (Signed)
New message    Furosemide 40 mg wants a 90 days supply   [facility]

## 2015-03-27 NOTE — Telephone Encounter (Signed)
Returned the pts call re: 90 day supply of Lasix to let him know that we sent in a rx 03-25-15 for #180 with additional 3 refills.  Pt stated that he would call [facility] back and let them know.  I advised pt that if they didn't receive it, just to have them refax a request.  Pt verbalized understanding.

## 2015-03-31 DIAGNOSIS — M6281 Muscle weakness (generalized): Secondary | ICD-10-CM | POA: Diagnosis not present

## 2015-04-07 ENCOUNTER — Ambulatory Visit (INDEPENDENT_AMBULATORY_CARE_PROVIDER_SITE_OTHER): Payer: Medicare Other | Admitting: Pharmacist

## 2015-04-07 DIAGNOSIS — I059 Rheumatic mitral valve disease, unspecified: Secondary | ICD-10-CM

## 2015-04-07 DIAGNOSIS — Z954 Presence of other heart-valve replacement: Secondary | ICD-10-CM | POA: Diagnosis not present

## 2015-04-07 DIAGNOSIS — I4891 Unspecified atrial fibrillation: Secondary | ICD-10-CM

## 2015-04-07 DIAGNOSIS — Z952 Presence of prosthetic heart valve: Secondary | ICD-10-CM

## 2015-04-07 LAB — POCT INR: INR: 2.2

## 2015-04-15 DIAGNOSIS — C44519 Basal cell carcinoma of skin of other part of trunk: Secondary | ICD-10-CM | POA: Diagnosis not present

## 2015-04-15 DIAGNOSIS — C44612 Basal cell carcinoma of skin of right upper limb, including shoulder: Secondary | ICD-10-CM | POA: Diagnosis not present

## 2015-04-15 DIAGNOSIS — Z85828 Personal history of other malignant neoplasm of skin: Secondary | ICD-10-CM | POA: Diagnosis not present

## 2015-04-15 DIAGNOSIS — D485 Neoplasm of uncertain behavior of skin: Secondary | ICD-10-CM | POA: Diagnosis not present

## 2015-04-15 DIAGNOSIS — C44722 Squamous cell carcinoma of skin of right lower limb, including hip: Secondary | ICD-10-CM | POA: Diagnosis not present

## 2015-04-23 ENCOUNTER — Ambulatory Visit (INDEPENDENT_AMBULATORY_CARE_PROVIDER_SITE_OTHER): Payer: Medicare Other | Admitting: *Deleted

## 2015-04-23 DIAGNOSIS — Z954 Presence of other heart-valve replacement: Secondary | ICD-10-CM

## 2015-04-23 DIAGNOSIS — I4891 Unspecified atrial fibrillation: Secondary | ICD-10-CM | POA: Diagnosis not present

## 2015-04-23 DIAGNOSIS — Z952 Presence of prosthetic heart valve: Secondary | ICD-10-CM

## 2015-04-23 DIAGNOSIS — I059 Rheumatic mitral valve disease, unspecified: Secondary | ICD-10-CM

## 2015-04-23 LAB — POCT INR: INR: 2

## 2015-05-07 ENCOUNTER — Ambulatory Visit (INDEPENDENT_AMBULATORY_CARE_PROVIDER_SITE_OTHER): Payer: Medicare Other | Admitting: *Deleted

## 2015-05-07 DIAGNOSIS — Z952 Presence of prosthetic heart valve: Secondary | ICD-10-CM

## 2015-05-07 DIAGNOSIS — I4891 Unspecified atrial fibrillation: Secondary | ICD-10-CM

## 2015-05-07 DIAGNOSIS — Z954 Presence of other heart-valve replacement: Secondary | ICD-10-CM

## 2015-05-07 DIAGNOSIS — I059 Rheumatic mitral valve disease, unspecified: Secondary | ICD-10-CM

## 2015-05-07 LAB — POCT INR: INR: 2.7

## 2015-05-10 ENCOUNTER — Other Ambulatory Visit: Payer: Self-pay | Admitting: Interventional Cardiology

## 2015-05-28 ENCOUNTER — Ambulatory Visit (INDEPENDENT_AMBULATORY_CARE_PROVIDER_SITE_OTHER): Payer: Medicare Other | Admitting: *Deleted

## 2015-05-28 DIAGNOSIS — I059 Rheumatic mitral valve disease, unspecified: Secondary | ICD-10-CM | POA: Diagnosis not present

## 2015-05-28 DIAGNOSIS — Z954 Presence of other heart-valve replacement: Secondary | ICD-10-CM

## 2015-05-28 DIAGNOSIS — I4891 Unspecified atrial fibrillation: Secondary | ICD-10-CM | POA: Diagnosis not present

## 2015-05-28 DIAGNOSIS — Z952 Presence of prosthetic heart valve: Secondary | ICD-10-CM

## 2015-05-28 LAB — POCT INR: INR: 2.2

## 2015-06-12 DIAGNOSIS — Z5181 Encounter for therapeutic drug level monitoring: Secondary | ICD-10-CM | POA: Diagnosis not present

## 2015-06-12 DIAGNOSIS — I351 Nonrheumatic aortic (valve) insufficiency: Secondary | ICD-10-CM | POA: Diagnosis not present

## 2015-06-12 DIAGNOSIS — Z0000 Encounter for general adult medical examination without abnormal findings: Secondary | ICD-10-CM | POA: Diagnosis not present

## 2015-06-12 DIAGNOSIS — D72819 Decreased white blood cell count, unspecified: Secondary | ICD-10-CM | POA: Diagnosis not present

## 2015-06-12 DIAGNOSIS — Z1389 Encounter for screening for other disorder: Secondary | ICD-10-CM | POA: Diagnosis not present

## 2015-06-16 DIAGNOSIS — Z85828 Personal history of other malignant neoplasm of skin: Secondary | ICD-10-CM | POA: Diagnosis not present

## 2015-06-16 DIAGNOSIS — L570 Actinic keratosis: Secondary | ICD-10-CM | POA: Diagnosis not present

## 2015-06-16 DIAGNOSIS — D485 Neoplasm of uncertain behavior of skin: Secondary | ICD-10-CM | POA: Diagnosis not present

## 2015-06-16 DIAGNOSIS — C44519 Basal cell carcinoma of skin of other part of trunk: Secondary | ICD-10-CM | POA: Diagnosis not present

## 2015-06-16 DIAGNOSIS — C44629 Squamous cell carcinoma of skin of left upper limb, including shoulder: Secondary | ICD-10-CM | POA: Diagnosis not present

## 2015-06-16 DIAGNOSIS — L821 Other seborrheic keratosis: Secondary | ICD-10-CM | POA: Diagnosis not present

## 2015-06-19 ENCOUNTER — Ambulatory Visit (INDEPENDENT_AMBULATORY_CARE_PROVIDER_SITE_OTHER): Payer: Medicare Other | Admitting: *Deleted

## 2015-06-19 DIAGNOSIS — I4891 Unspecified atrial fibrillation: Secondary | ICD-10-CM | POA: Diagnosis not present

## 2015-06-19 DIAGNOSIS — Z954 Presence of other heart-valve replacement: Secondary | ICD-10-CM | POA: Diagnosis not present

## 2015-06-19 DIAGNOSIS — Z952 Presence of prosthetic heart valve: Secondary | ICD-10-CM

## 2015-06-19 DIAGNOSIS — I059 Rheumatic mitral valve disease, unspecified: Secondary | ICD-10-CM

## 2015-06-19 LAB — POCT INR: INR: 2.7

## 2015-06-27 DIAGNOSIS — Z5181 Encounter for therapeutic drug level monitoring: Secondary | ICD-10-CM | POA: Diagnosis not present

## 2015-07-16 ENCOUNTER — Ambulatory Visit (INDEPENDENT_AMBULATORY_CARE_PROVIDER_SITE_OTHER): Payer: Medicare Other | Admitting: Pharmacist

## 2015-07-16 DIAGNOSIS — I059 Rheumatic mitral valve disease, unspecified: Secondary | ICD-10-CM

## 2015-07-16 DIAGNOSIS — Z952 Presence of prosthetic heart valve: Secondary | ICD-10-CM

## 2015-07-16 DIAGNOSIS — Z954 Presence of other heart-valve replacement: Secondary | ICD-10-CM | POA: Diagnosis not present

## 2015-07-16 DIAGNOSIS — I4891 Unspecified atrial fibrillation: Secondary | ICD-10-CM | POA: Diagnosis not present

## 2015-07-16 LAB — POCT INR: INR: 2.8

## 2015-08-13 ENCOUNTER — Ambulatory Visit (INDEPENDENT_AMBULATORY_CARE_PROVIDER_SITE_OTHER): Payer: Medicare Other | Admitting: *Deleted

## 2015-08-13 DIAGNOSIS — Z954 Presence of other heart-valve replacement: Secondary | ICD-10-CM | POA: Diagnosis not present

## 2015-08-13 DIAGNOSIS — Z952 Presence of prosthetic heart valve: Secondary | ICD-10-CM

## 2015-08-13 DIAGNOSIS — I4891 Unspecified atrial fibrillation: Secondary | ICD-10-CM | POA: Diagnosis not present

## 2015-08-13 DIAGNOSIS — I059 Rheumatic mitral valve disease, unspecified: Secondary | ICD-10-CM

## 2015-08-13 LAB — POCT INR: INR: 3

## 2015-08-15 ENCOUNTER — Other Ambulatory Visit: Payer: Self-pay | Admitting: Interventional Cardiology

## 2015-09-24 ENCOUNTER — Ambulatory Visit (INDEPENDENT_AMBULATORY_CARE_PROVIDER_SITE_OTHER): Payer: Medicare Other | Admitting: *Deleted

## 2015-09-24 DIAGNOSIS — I059 Rheumatic mitral valve disease, unspecified: Secondary | ICD-10-CM

## 2015-09-24 DIAGNOSIS — I4891 Unspecified atrial fibrillation: Secondary | ICD-10-CM

## 2015-09-24 DIAGNOSIS — Z952 Presence of prosthetic heart valve: Secondary | ICD-10-CM

## 2015-09-24 DIAGNOSIS — Z954 Presence of other heart-valve replacement: Secondary | ICD-10-CM

## 2015-09-24 LAB — POCT INR: INR: 2.1

## 2015-10-08 ENCOUNTER — Ambulatory Visit (INDEPENDENT_AMBULATORY_CARE_PROVIDER_SITE_OTHER): Payer: Medicare Other | Admitting: Pharmacist

## 2015-10-08 DIAGNOSIS — I4891 Unspecified atrial fibrillation: Secondary | ICD-10-CM | POA: Diagnosis not present

## 2015-10-08 DIAGNOSIS — Z954 Presence of other heart-valve replacement: Secondary | ICD-10-CM | POA: Diagnosis not present

## 2015-10-08 DIAGNOSIS — Z952 Presence of prosthetic heart valve: Secondary | ICD-10-CM

## 2015-10-08 DIAGNOSIS — I059 Rheumatic mitral valve disease, unspecified: Secondary | ICD-10-CM | POA: Diagnosis not present

## 2015-10-08 LAB — POCT INR: INR: 2.6

## 2015-11-05 ENCOUNTER — Ambulatory Visit (INDEPENDENT_AMBULATORY_CARE_PROVIDER_SITE_OTHER): Payer: Medicare Other | Admitting: *Deleted

## 2015-11-05 DIAGNOSIS — I059 Rheumatic mitral valve disease, unspecified: Secondary | ICD-10-CM

## 2015-11-05 DIAGNOSIS — I4891 Unspecified atrial fibrillation: Secondary | ICD-10-CM | POA: Diagnosis not present

## 2015-11-05 DIAGNOSIS — Z954 Presence of other heart-valve replacement: Secondary | ICD-10-CM | POA: Diagnosis not present

## 2015-11-05 DIAGNOSIS — Z952 Presence of prosthetic heart valve: Secondary | ICD-10-CM

## 2015-11-05 LAB — POCT INR: INR: 2.9

## 2015-12-03 ENCOUNTER — Ambulatory Visit (INDEPENDENT_AMBULATORY_CARE_PROVIDER_SITE_OTHER): Payer: Medicare Other | Admitting: *Deleted

## 2015-12-03 DIAGNOSIS — I059 Rheumatic mitral valve disease, unspecified: Secondary | ICD-10-CM | POA: Diagnosis not present

## 2015-12-03 DIAGNOSIS — Z952 Presence of prosthetic heart valve: Secondary | ICD-10-CM

## 2015-12-03 DIAGNOSIS — Z954 Presence of other heart-valve replacement: Secondary | ICD-10-CM

## 2015-12-03 DIAGNOSIS — I4891 Unspecified atrial fibrillation: Secondary | ICD-10-CM

## 2015-12-03 LAB — POCT INR: INR: 3.4

## 2015-12-09 DIAGNOSIS — H04123 Dry eye syndrome of bilateral lacrimal glands: Secondary | ICD-10-CM | POA: Diagnosis not present

## 2015-12-09 DIAGNOSIS — H353132 Nonexudative age-related macular degeneration, bilateral, intermediate dry stage: Secondary | ICD-10-CM | POA: Diagnosis not present

## 2015-12-09 DIAGNOSIS — H5213 Myopia, bilateral: Secondary | ICD-10-CM | POA: Diagnosis not present

## 2015-12-09 DIAGNOSIS — H524 Presbyopia: Secondary | ICD-10-CM | POA: Diagnosis not present

## 2015-12-15 DIAGNOSIS — L570 Actinic keratosis: Secondary | ICD-10-CM | POA: Diagnosis not present

## 2015-12-15 DIAGNOSIS — D485 Neoplasm of uncertain behavior of skin: Secondary | ICD-10-CM | POA: Diagnosis not present

## 2015-12-15 DIAGNOSIS — D1801 Hemangioma of skin and subcutaneous tissue: Secondary | ICD-10-CM | POA: Diagnosis not present

## 2015-12-15 DIAGNOSIS — Z85828 Personal history of other malignant neoplasm of skin: Secondary | ICD-10-CM | POA: Diagnosis not present

## 2015-12-15 DIAGNOSIS — L821 Other seborrheic keratosis: Secondary | ICD-10-CM | POA: Diagnosis not present

## 2015-12-31 ENCOUNTER — Ambulatory Visit (INDEPENDENT_AMBULATORY_CARE_PROVIDER_SITE_OTHER): Payer: Medicare Other | Admitting: Pharmacist

## 2015-12-31 DIAGNOSIS — I059 Rheumatic mitral valve disease, unspecified: Secondary | ICD-10-CM

## 2015-12-31 DIAGNOSIS — Z954 Presence of other heart-valve replacement: Secondary | ICD-10-CM

## 2015-12-31 DIAGNOSIS — Z952 Presence of prosthetic heart valve: Secondary | ICD-10-CM

## 2015-12-31 DIAGNOSIS — I4891 Unspecified atrial fibrillation: Secondary | ICD-10-CM | POA: Diagnosis not present

## 2015-12-31 LAB — POCT INR: INR: 2.6

## 2016-01-05 ENCOUNTER — Telehealth: Payer: Self-pay | Admitting: Pharmacist

## 2016-01-05 NOTE — Telephone Encounter (Signed)
Returned patient's call. He states that he missed his Coumadin dose on Saturday night so he took an extra 1/2 tablet last night and is planning on taking an extra 1/2 tablet today. Advised pt that I agree with his plan. Will keep f/u appt as scheduled.

## 2016-01-05 NOTE — Telephone Encounter (Signed)
New message      Talk to the pharmacist---pt missed his coumadin dosage.  Please advise

## 2016-01-12 ENCOUNTER — Ambulatory Visit (INDEPENDENT_AMBULATORY_CARE_PROVIDER_SITE_OTHER): Payer: Medicare Other | Admitting: Cardiology

## 2016-01-12 ENCOUNTER — Encounter: Payer: Self-pay | Admitting: Cardiology

## 2016-01-12 VITALS — BP 123/68 | HR 84 | Ht 72.0 in | Wt 162.0 lb

## 2016-01-12 DIAGNOSIS — Z954 Presence of other heart-valve replacement: Secondary | ICD-10-CM

## 2016-01-12 DIAGNOSIS — Z952 Presence of prosthetic heart valve: Secondary | ICD-10-CM

## 2016-01-12 DIAGNOSIS — I059 Rheumatic mitral valve disease, unspecified: Secondary | ICD-10-CM

## 2016-01-12 DIAGNOSIS — R0609 Other forms of dyspnea: Secondary | ICD-10-CM

## 2016-01-12 DIAGNOSIS — I872 Venous insufficiency (chronic) (peripheral): Secondary | ICD-10-CM | POA: Diagnosis not present

## 2016-01-12 DIAGNOSIS — I480 Paroxysmal atrial fibrillation: Secondary | ICD-10-CM

## 2016-01-12 DIAGNOSIS — I351 Nonrheumatic aortic (valve) insufficiency: Secondary | ICD-10-CM

## 2016-01-12 NOTE — Patient Instructions (Signed)
Your physician has requested that you have an echocardiogram. Echocardiography is a painless test that uses sound waves to create images of your heart. It provides your doctor with information about the size and shape of your heart and how well your heart's chambers and valves are working. This procedure takes approximately one hour. There are no restrictions for this procedure.  Your physician recommends that you schedule a follow-up appointment pending echo results.  6 months if abnormal, 1 year if normal.

## 2016-01-12 NOTE — Progress Notes (Signed)
PCP: Irven Shelling, MD  Clinic Note: Chief Complaint  Patient presents with  . Follow-up    PT C/O SOB ON EXERTION  . Shortness of Breath    HPI: Richard Spears is a 82 y.o. male who has had AFib and MV replacement (no CABG at the time of surgery). He is not having problems on his anticoagulation.  He denies [facility] with exertion. He walks on a treadmill several times a week for exercise. He moved recently to [facility]. His wife also has fast heart rates.    Richard Spears was last seen in Aug 2016 by Dr. Irish Lack.  ASSESSMENT AND PLAN - ,2016: Was doing well without any shortness of breath with exertion etc. Was walking on treadmill several times a week.  1. AFib: No palpitations. Coumadin for stroke prevention. Using Zebeta and tolerating well.  2. Mitral valve replacement: Valve appears to be functioning well.  3. AI: moderate. 4. Mild orthostatic dizziness: Change position slowly. 5. Venous insufficiency: He has talked to a [employer] supply and he is requesting a prescription. Can try knee high 20-30 mm Hg stockings.  Recent Hospitalizations:  None  Studies Reviewed: None since 2012  Interval History: Richard Spears presents today to establish new cardiology provider to follow-up is mechanical in atrial fibrillation. He has moved to the patient, and therefore asked to switch. He previously saw Dr. Ilda Foil before seeing Dr. Lendell Caprice.   He still tries to remain active, but has noted that 3 month mail in June he was starting to feel short of breath than usual but really just things like short of breath. He'll. Despite that in April May 5 K are walk without any problems. Currently he just feels tired and now when after walking the treadmill. He also says that it's improving again. He continues to walk. He says that once he gets himself to the initial phase of feeling tired on the treadmill, he feels better and is able to continue going. This is the  initial stages of walking. He occasionally note some symptoms of orthopnea or PND, but not routinely. He has chronic venous insufficiency with edema. He wears those in the wintertime, but has a hard time wearing them in the summertime. He denies any chest palin or shortness of breath with rest or exertion.   Remainder of cardiac review of systems: No palpitations,lightheadedness, dizziness, weakness or syncope/near syncope. No TIA/amaurosis fugax symptoms. No melena, hematochezia, hematuria, or epstaxis. No claudication.  ROS: A comprehensive was performed. Review of Systems  Constitutional: Positive for malaise/fatigue (When he first starts walking).  HENT: Negative for nosebleeds.   Respiratory: Positive for shortness of breath (In initial stages walking). Negative for cough.   Cardiovascular:       Per history of present illness  Gastrointestinal: Negative for abdominal pain, blood in stool, heartburn and melena.  Genitourinary: Negative for dysuria and hematuria.  Musculoskeletal: Negative for joint pain and myalgias.  Neurological: Negative for dizziness and headaches.  Psychiatric/Behavioral: Negative for depression and memory loss. The patient is not nervous/anxious and does not have insomnia.     Past Medical History:  Diagnosis Date  . Anemia    leakoppenia  . BPH (benign prostatic hypertrophy)   . Bullous pemphigoid    [name], March 2011, right forearm squamous cell carcinoma  . Chronic anticoagulation    systemic  . Colon polyp    transverse, 2002  . History of peptic ulcer    remote, 3/95  .  Hx of actinic keratosis   . Hx of basal cell carcinoma   . Hx of squamous cell carcinoma of skin   . Hyperlipidemia   . Left inguinal hernia   . Moderate aortic insufficiency 2009   audible aortic insufficiency on 1/09 echo  . PAF (paroxysmal atrial fibrillation) ([place]) 01/17/2014   On Warfarin.  . S/P mitral valve replacement with metallic valve [id number]   INR goal  2.5-3.5, St Jude,   . 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 Electrodesiccation and Curettage and Shave Biopsy Right    Right medial, anterio tibia: Well differentiated Squamous Cell  . hip replacements Left    10 years ago  . MITRAL VALVE REPLACEMENT  03/1996   St. Jude mechanical valve  . TRANSTHORACIC ECHOCARDIOGRAM  01/19/2011   EF 60-65%. No regional wall motion abnormality. Mechanical mitral valve in place. Moderate aortic regurgitation.    Prior to Admission medications   Medication Sig Start Date End Date Taking? Authorizing Provider  bisoprolol (ZEBETA) 5 MG tablet Take 1 tablet (5 mg total) by mouth daily. 05/12/15  Yes Jettie Booze, MD  COUMADIN 5 MG tablet TAKE AS DIRECTED BY COUMADIN CLINIC 08/15/15  Yes Jettie Booze, MD  fluticasone [facility]) 50 MCG/ACT nasal spray Place 2 sprays into both nostrils as needed for allergies or rhinitis.   Yes Historical Provider, MD  furosemide (LASIX) 40 MG tablet Take 1 tablet (40 mg total) by mouth 2 (two) times daily. 03/25/15  Yes Jettie Booze, MD  ketoconazole (NIZORAL) 2 % shampoo Apply 1 application topically 2 (two) times a week.   Yes Historical Provider, MD  LORazepam (ATIVAN) 1 MG tablet Take 1 mg by mouth every 8 (eight) hours as needed for anxiety.    Yes Historical Provider, MD  Multiple Vitamins-Minerals (MULTIVITAMIN PO) Take 1 tablet by mouth daily.   Yes Historical Provider, MD   Allergies  Allergen Reactions  . Flexeril [Cyclobenzaprine]     Diarrhea     Social History   Social History  . Marital status: Married    Spouse name: N/A  . Number of children: 2  . Years of education: N/A   Occupational History  . retired    Social History Main Topics  . Smoking status: Former Smoker    Packs/day: 1.00    Years: 8.00    Types: Cigarettes    Quit date: 10/31/1954  . Smokeless tobacco: Never Used  .  Alcohol use 0.0 oz/week     Comment: 1-2 drinks per day  . Drug use: Unknown  . Sexual activity: Not Asked   Other Topics Concern  . None   Social History Narrative  . None    Family History  Problem Relation Age of Onset  . Hypertension Mother   . Lung cancer Sister   . COPD Brother   . Cancer Brother   . Other Sister     polio  . Lupus Son     Wt Readings from Last 3 Encounters:  01/12/16 162 lb (73.5 kg)  02/14/15 164 lb 12.8 oz (74.8 kg)  10/31/14 166 lb 11.2 oz (75.6 kg)    PHYSICAL EXAM BP 123/68   Pulse 84   Ht 6' (1.829 m)   Wt 162 lb (73.5 kg)   SpO2 99%   BMI 21.97 kg/m   General appearance: alert, cooperative, appears stated  age, no distress - Otherwise healthy-appearing Neck: no adenopathy, no carotid bruit and no JVD Lungs: clear to auscultation bilaterally, normal percussion bilaterally and non-labored Heart: regular rate and rhythm, crisp S1, S2 normal, no click, rub or gallop; nondisplaced PMI. Very soft 1/6 DM at LUSB Abdomen: soft, non-tender; bowel sounds normal; no masses,  no organomegaly; no HJR Extremities: extremities normal, atraumatic, no cyanosis, or edema  Pulses: 2+ and symmetric;  Skin: normal and mobility and turgor normal , with mild bruising Neurologic: Mental status: Alert, oriented, thought content appropriate Cranial nerves: normal (II-XII grossly intact)    Adult ECG Report  Rate: 84 ;  Rhythm: Sinus rhythm with first-degree AV block. Also cannot exclude potential flutter. The patient to be at least one blocked PAC.;   Narrative Interpretation: Otherwise normal axis, intervals and durations. PR interval is longer in this EKG prior EKGs.   Other studies Reviewed: Additional studies/ records that were reviewed today include:  Recent Labs:  Labs monitored by PCP.    ASSESSMENT / PLAN: Problem List Items Addressed This Visit    Venous insufficiency (Chronic)    He wears compression stockings and has standing dose of  Lasix. No other heart failure symptoms associated with it.       S/P MVR (mitral valve replacement) (Chronic)    The mechanical valve seems to be working well. Sounds normal on exam. He is on warfarin for anticoagulation. Due for follow-up echocardiogram.  Plan: 2-D echo to assess mechanical mitral valve as well as aortic valve regurgitation. Also for exertional dyspnea      Mitral valve disorder - Primary   Relevant Orders   EKG 12-Lead (Completed)   ECHOCARDIOGRAM COMPLETE   Exertional dyspnea    At this point, I can't be sure if he is having some dyspnea related to heart failure or aortic regurgitation versus simply chronotropic incompetence. The fact that his symptoms get better once he has been walking for a while suggest some mild chronotropic incompetence related to A. Fib.  We'll check a 2-D echocardiogram to ensure EF is stable.      Atrial fibrillation ([place]) (Chronic)    As far as I can tell, he does not  noted any recurrence of A. fib. He does notnoticed any rapid irregular heartbeats or palpitations. He may be having episodes of A. fib which make him feel short of breath, or he may be having some mild chronotropic incompetence.   He is on ZeBeta with adequate rate control - although may be concerning for chronotropic incompetence especially in light of his long first-degree AV block. Monitor for symptoms. May warrant treadmill GXT to assess responsiveness.  This patients CHA2DS2-VASc Score and unadjusted Ischemic Stroke Rate (% per year) is equal to 2.2 % stroke rate/year from a score of 2 He is on warfarin for anticoagulation  - secondary to him having a mechanical mitral valve.      Relevant Orders   EKG 12-Lead (Completed)   ECHOCARDIOGRAM COMPLETE   AI (aortic insufficiency) (Chronic)    Moderate regurgitation by echocardiogram in 2012. Has not been evaluated since. I don't hear much in the way of any regurgitation on exam, but warrants evaluation. Especially in  light of his exertional dyspnea  Plan: Check 2-D echo       Other Visit Diagnoses   None.     Current medicines are reviewed at length with the patient today. (+/- concerns) none Following changes have been made:  None  Studies Ordered:  Orders Placed This Encounter  Procedures  . EKG 12-Lead  . ECHOCARDIOGRAM COMPLETE    ROV with me - 6 months if echo shows worsening. Otherwise 1 year.      Glenetta Hew, M.D., M.S. Interventional Cardiologist   Pager # [phone] Phone # [phone] [address] [place], [place] [address]

## 2016-01-18 ENCOUNTER — Encounter: Payer: Self-pay | Admitting: Cardiology

## 2016-01-18 DIAGNOSIS — R0609 Other forms of dyspnea: Secondary | ICD-10-CM | POA: Insufficient documentation

## 2016-01-18 NOTE — Assessment & Plan Note (Signed)
The mechanical valve seems to be working well. Sounds normal on exam. He is on warfarin for anticoagulation. Due for follow-up echocardiogram.  Plan: 2-D echo to assess mechanical mitral valve as well as aortic valve regurgitation. Also for exertional dyspnea

## 2016-01-18 NOTE — Assessment & Plan Note (Signed)
At this point, I can't be sure if he is having some dyspnea related to heart failure or aortic regurgitation versus simply chronotropic incompetence. The fact that his symptoms get better once he has been walking for a while suggest some mild chronotropic incompetence related to A. Fib.  We'll check a 2-D echocardiogram to ensure EF is stable.

## 2016-01-18 NOTE — Assessment & Plan Note (Signed)
Moderate regurgitation by echocardiogram in 2012. Has not been evaluated since. I don't hear much in the way of any regurgitation on exam, but warrants evaluation. Especially in light of his exertional dyspnea  Plan: Check 2-D echo

## 2016-01-18 NOTE — Assessment & Plan Note (Addendum)
As far as I can tell, he does not  noted any recurrence of A. fib. He does notnoticed any rapid irregular heartbeats or palpitations. He may be having episodes of A. fib which make him feel short of breath, or he may be having some mild chronotropic incompetence.   He is on ZeBeta with adequate rate control - although may be concerning for chronotropic incompetence especially in light of his long first-degree AV block. Monitor for symptoms. May warrant treadmill GXT to assess responsiveness.  This patients CHA2DS2-VASc Score and unadjusted Ischemic Stroke Rate (% per year) is equal to 2.2 % stroke rate/year from a score of 2 He is on warfarin for anticoagulation  - secondary to him having a mechanical mitral valve.

## 2016-01-18 NOTE — Assessment & Plan Note (Signed)
He wears compression stockings and has standing dose of Lasix. No other heart failure symptoms associated with it.

## 2016-01-27 ENCOUNTER — Ambulatory Visit (INDEPENDENT_AMBULATORY_CARE_PROVIDER_SITE_OTHER): Payer: Medicare Other | Admitting: *Deleted

## 2016-01-27 ENCOUNTER — Other Ambulatory Visit (HOSPITAL_COMMUNITY): Payer: Self-pay

## 2016-01-27 ENCOUNTER — Ambulatory Visit (HOSPITAL_COMMUNITY): Payer: Medicare Other | Attending: Cardiovascular Disease

## 2016-01-27 DIAGNOSIS — E785 Hyperlipidemia, unspecified: Secondary | ICD-10-CM | POA: Insufficient documentation

## 2016-01-27 DIAGNOSIS — Z952 Presence of prosthetic heart valve: Secondary | ICD-10-CM | POA: Diagnosis not present

## 2016-01-27 DIAGNOSIS — I059 Rheumatic mitral valve disease, unspecified: Secondary | ICD-10-CM | POA: Diagnosis not present

## 2016-01-27 DIAGNOSIS — I517 Cardiomegaly: Secondary | ICD-10-CM | POA: Diagnosis not present

## 2016-01-27 DIAGNOSIS — I480 Paroxysmal atrial fibrillation: Secondary | ICD-10-CM

## 2016-01-27 DIAGNOSIS — I071 Rheumatic tricuspid insufficiency: Secondary | ICD-10-CM | POA: Insufficient documentation

## 2016-01-27 DIAGNOSIS — I351 Nonrheumatic aortic (valve) insufficiency: Secondary | ICD-10-CM | POA: Diagnosis not present

## 2016-01-27 DIAGNOSIS — Z954 Presence of other heart-valve replacement: Secondary | ICD-10-CM

## 2016-01-27 LAB — ECHOCARDIOGRAM COMPLETE
AVLVOTPG: 2 mmHg
Ao-asc: 33 cm
CHL CUP MV DEC (S): 243
EWDT: 243 ms
FS: 30 % (ref 28–44)
IVS/LV PW RATIO, ED: 0.98
LA ID, A-P, ES: 38 mm
LA vol A4C: 89 ml
LA vol index: 54.4 mL/m2
LADIAMINDEX: 1.95 cm/m2
LAVOL: 106 mL
LEFT ATRIUM END SYS DIAM: 38 mm
LV PW d: 12.7 mm — AB (ref 0.6–1.1)
LVOT SV: 49 mL
LVOT VTI: 13 cm
LVOT area: 3.8 cm2
LVOTD: 22 mm
LVOTPV: 64.6 cm/s
MV Peak grad: 8 mmHg
MV pk E vel: 141 m/s
P 1/2 time: 525 ms
RV sys press: 34 mmHg
Reg peak vel: 277 cm/s
TRMAXVEL: 277 cm/s

## 2016-01-27 LAB — POCT INR: INR: 3.1

## 2016-02-23 ENCOUNTER — Encounter: Payer: Self-pay | Admitting: Cardiology

## 2016-02-23 ENCOUNTER — Telehealth: Payer: Self-pay | Admitting: Cardiology

## 2016-02-23 NOTE — Telephone Encounter (Signed)
Vaccinations added to vaccination hx list.

## 2016-02-23 NOTE — Telephone Encounter (Signed)
New message       FYI     Pt wants to notify you that he had his shingles shot on 03/29/15 at [facility]. He had his pneumonia shot on 02/02/06 and his booster on 06/10/14. He will be getting his flu shoot soon.

## 2016-02-25 ENCOUNTER — Ambulatory Visit (INDEPENDENT_AMBULATORY_CARE_PROVIDER_SITE_OTHER): Payer: Medicare Other | Admitting: *Deleted

## 2016-02-25 ENCOUNTER — Encounter: Payer: Self-pay | Admitting: Pharmacist

## 2016-02-25 DIAGNOSIS — Z954 Presence of other heart-valve replacement: Secondary | ICD-10-CM

## 2016-02-25 DIAGNOSIS — I480 Paroxysmal atrial fibrillation: Secondary | ICD-10-CM | POA: Diagnosis not present

## 2016-02-25 DIAGNOSIS — I059 Rheumatic mitral valve disease, unspecified: Secondary | ICD-10-CM

## 2016-02-25 DIAGNOSIS — Z952 Presence of prosthetic heart valve: Secondary | ICD-10-CM

## 2016-02-25 LAB — POCT INR: INR: 2.6

## 2016-03-15 ENCOUNTER — Other Ambulatory Visit: Payer: Self-pay | Admitting: Interventional Cardiology

## 2016-03-24 ENCOUNTER — Ambulatory Visit (INDEPENDENT_AMBULATORY_CARE_PROVIDER_SITE_OTHER): Payer: Medicare Other | Admitting: *Deleted

## 2016-03-24 DIAGNOSIS — Z954 Presence of other heart-valve replacement: Secondary | ICD-10-CM

## 2016-03-24 DIAGNOSIS — I059 Rheumatic mitral valve disease, unspecified: Secondary | ICD-10-CM

## 2016-03-24 DIAGNOSIS — I480 Paroxysmal atrial fibrillation: Secondary | ICD-10-CM

## 2016-03-24 DIAGNOSIS — Z952 Presence of prosthetic heart valve: Secondary | ICD-10-CM

## 2016-03-24 LAB — POCT INR: INR: 2.9

## 2016-04-02 DIAGNOSIS — B9789 Other viral agents as the cause of diseases classified elsewhere: Secondary | ICD-10-CM | POA: Diagnosis not present

## 2016-04-02 DIAGNOSIS — J069 Acute upper respiratory infection, unspecified: Secondary | ICD-10-CM | POA: Diagnosis not present

## 2016-04-20 DIAGNOSIS — Z23 Encounter for immunization: Secondary | ICD-10-CM | POA: Diagnosis not present

## 2016-04-27 ENCOUNTER — Telehealth: Payer: Self-pay | Admitting: Cardiology

## 2016-04-27 NOTE — Telephone Encounter (Signed)
New message    Pt verbalized that he is calling to update his flu shot information and let the rn know that he had it done

## 2016-04-27 NOTE — Telephone Encounter (Signed)
Patient calling wanting to have flu vaccination updated as he and his wife both got flu shots on 04/20/16, Fluzone H (PD) at [facility], [place] [address], [place], [place].  Information updated in medical record for patient and his wife, Koren Shiver. Nappier, DOB 11/02/1927, per request of patient and his wife who was on the phone as well.

## 2016-05-05 ENCOUNTER — Ambulatory Visit (INDEPENDENT_AMBULATORY_CARE_PROVIDER_SITE_OTHER): Payer: Medicare Other | Admitting: *Deleted

## 2016-05-05 DIAGNOSIS — Z952 Presence of prosthetic heart valve: Secondary | ICD-10-CM

## 2016-05-05 DIAGNOSIS — I059 Rheumatic mitral valve disease, unspecified: Secondary | ICD-10-CM

## 2016-05-05 DIAGNOSIS — I480 Paroxysmal atrial fibrillation: Secondary | ICD-10-CM

## 2016-05-05 LAB — POCT INR: INR: 2.7

## 2016-06-15 DIAGNOSIS — Z85828 Personal history of other malignant neoplasm of skin: Secondary | ICD-10-CM | POA: Diagnosis not present

## 2016-06-15 DIAGNOSIS — D485 Neoplasm of uncertain behavior of skin: Secondary | ICD-10-CM | POA: Diagnosis not present

## 2016-06-15 DIAGNOSIS — L821 Other seborrheic keratosis: Secondary | ICD-10-CM | POA: Diagnosis not present

## 2016-06-15 DIAGNOSIS — L570 Actinic keratosis: Secondary | ICD-10-CM | POA: Diagnosis not present

## 2016-06-15 DIAGNOSIS — D1801 Hemangioma of skin and subcutaneous tissue: Secondary | ICD-10-CM | POA: Diagnosis not present

## 2016-06-16 ENCOUNTER — Ambulatory Visit (INDEPENDENT_AMBULATORY_CARE_PROVIDER_SITE_OTHER): Payer: Medicare Other | Admitting: *Deleted

## 2016-06-16 DIAGNOSIS — I059 Rheumatic mitral valve disease, unspecified: Secondary | ICD-10-CM

## 2016-06-16 DIAGNOSIS — I480 Paroxysmal atrial fibrillation: Secondary | ICD-10-CM | POA: Diagnosis not present

## 2016-06-16 DIAGNOSIS — Z952 Presence of prosthetic heart valve: Secondary | ICD-10-CM | POA: Diagnosis not present

## 2016-06-16 LAB — POCT INR: INR: 2.9

## 2016-06-24 DIAGNOSIS — D3141 Benign neoplasm of right ciliary body: Secondary | ICD-10-CM | POA: Diagnosis not present

## 2016-07-02 DIAGNOSIS — I351 Nonrheumatic aortic (valve) insufficiency: Secondary | ICD-10-CM | POA: Diagnosis not present

## 2016-07-02 DIAGNOSIS — I480 Paroxysmal atrial fibrillation: Secondary | ICD-10-CM | POA: Diagnosis not present

## 2016-07-02 DIAGNOSIS — N183 Chronic kidney disease, stage 3 (moderate): Secondary | ICD-10-CM | POA: Diagnosis not present

## 2016-07-02 DIAGNOSIS — D72819 Decreased white blood cell count, unspecified: Secondary | ICD-10-CM | POA: Diagnosis not present

## 2016-07-02 DIAGNOSIS — D649 Anemia, unspecified: Secondary | ICD-10-CM | POA: Diagnosis not present

## 2016-07-02 DIAGNOSIS — I340 Nonrheumatic mitral (valve) insufficiency: Secondary | ICD-10-CM | POA: Diagnosis not present

## 2016-07-02 DIAGNOSIS — Z1389 Encounter for screening for other disorder: Secondary | ICD-10-CM | POA: Diagnosis not present

## 2016-07-02 DIAGNOSIS — Z952 Presence of prosthetic heart valve: Secondary | ICD-10-CM | POA: Diagnosis not present

## 2016-07-28 ENCOUNTER — Ambulatory Visit (INDEPENDENT_AMBULATORY_CARE_PROVIDER_SITE_OTHER): Payer: Medicare Other | Admitting: *Deleted

## 2016-07-28 DIAGNOSIS — I480 Paroxysmal atrial fibrillation: Secondary | ICD-10-CM

## 2016-07-28 DIAGNOSIS — I059 Rheumatic mitral valve disease, unspecified: Secondary | ICD-10-CM | POA: Diagnosis not present

## 2016-07-28 DIAGNOSIS — Z952 Presence of prosthetic heart valve: Secondary | ICD-10-CM | POA: Diagnosis not present

## 2016-07-28 LAB — POCT INR: INR: 3.4

## 2016-08-03 ENCOUNTER — Other Ambulatory Visit: Payer: Self-pay

## 2016-08-03 MED ORDER — FUROSEMIDE 40 MG PO TABS: 40.0000 mg | ORAL_TABLET | Freq: Two times a day (BID) | ORAL | 1 refills | Status: DC

## 2016-09-08 ENCOUNTER — Ambulatory Visit (INDEPENDENT_AMBULATORY_CARE_PROVIDER_SITE_OTHER): Payer: Medicare Other | Admitting: *Deleted

## 2016-09-08 DIAGNOSIS — I059 Rheumatic mitral valve disease, unspecified: Secondary | ICD-10-CM | POA: Diagnosis not present

## 2016-09-08 DIAGNOSIS — I480 Paroxysmal atrial fibrillation: Secondary | ICD-10-CM | POA: Diagnosis not present

## 2016-09-08 DIAGNOSIS — Z952 Presence of prosthetic heart valve: Secondary | ICD-10-CM | POA: Diagnosis not present

## 2016-09-08 LAB — POCT INR: INR: 3.4

## 2016-10-14 DIAGNOSIS — D72819 Decreased white blood cell count, unspecified: Secondary | ICD-10-CM | POA: Diagnosis not present

## 2016-10-20 ENCOUNTER — Ambulatory Visit (INDEPENDENT_AMBULATORY_CARE_PROVIDER_SITE_OTHER): Payer: Medicare Other | Admitting: *Deleted

## 2016-10-20 DIAGNOSIS — I480 Paroxysmal atrial fibrillation: Secondary | ICD-10-CM

## 2016-10-20 DIAGNOSIS — Z952 Presence of prosthetic heart valve: Secondary | ICD-10-CM | POA: Diagnosis not present

## 2016-10-20 DIAGNOSIS — I059 Rheumatic mitral valve disease, unspecified: Secondary | ICD-10-CM | POA: Diagnosis not present

## 2016-10-20 LAB — POCT INR: INR: 2.4

## 2016-11-10 ENCOUNTER — Ambulatory Visit (INDEPENDENT_AMBULATORY_CARE_PROVIDER_SITE_OTHER): Payer: Medicare Other | Admitting: *Deleted

## 2016-11-10 DIAGNOSIS — Z952 Presence of prosthetic heart valve: Secondary | ICD-10-CM | POA: Diagnosis not present

## 2016-11-10 DIAGNOSIS — I059 Rheumatic mitral valve disease, unspecified: Secondary | ICD-10-CM

## 2016-11-10 DIAGNOSIS — I480 Paroxysmal atrial fibrillation: Secondary | ICD-10-CM | POA: Diagnosis not present

## 2016-11-10 LAB — POCT INR: INR: 2.4

## 2016-11-24 ENCOUNTER — Ambulatory Visit (INDEPENDENT_AMBULATORY_CARE_PROVIDER_SITE_OTHER): Payer: Medicare Other | Admitting: *Deleted

## 2016-11-24 DIAGNOSIS — Z952 Presence of prosthetic heart valve: Secondary | ICD-10-CM | POA: Diagnosis not present

## 2016-11-24 DIAGNOSIS — I059 Rheumatic mitral valve disease, unspecified: Secondary | ICD-10-CM

## 2016-11-24 DIAGNOSIS — I480 Paroxysmal atrial fibrillation: Secondary | ICD-10-CM | POA: Diagnosis not present

## 2016-11-24 LAB — POCT INR: INR: 3.7

## 2016-12-08 ENCOUNTER — Ambulatory Visit (INDEPENDENT_AMBULATORY_CARE_PROVIDER_SITE_OTHER): Payer: Medicare Other | Admitting: *Deleted

## 2016-12-08 DIAGNOSIS — Z952 Presence of prosthetic heart valve: Secondary | ICD-10-CM | POA: Diagnosis not present

## 2016-12-08 DIAGNOSIS — I480 Paroxysmal atrial fibrillation: Secondary | ICD-10-CM

## 2016-12-08 DIAGNOSIS — I059 Rheumatic mitral valve disease, unspecified: Secondary | ICD-10-CM | POA: Diagnosis not present

## 2016-12-08 LAB — POCT INR: INR: 2.6

## 2016-12-15 DIAGNOSIS — Z85828 Personal history of other malignant neoplasm of skin: Secondary | ICD-10-CM | POA: Diagnosis not present

## 2016-12-15 DIAGNOSIS — D1801 Hemangioma of skin and subcutaneous tissue: Secondary | ICD-10-CM | POA: Diagnosis not present

## 2016-12-15 DIAGNOSIS — L821 Other seborrheic keratosis: Secondary | ICD-10-CM | POA: Diagnosis not present

## 2016-12-15 DIAGNOSIS — L570 Actinic keratosis: Secondary | ICD-10-CM | POA: Diagnosis not present

## 2017-01-03 ENCOUNTER — Ambulatory Visit (INDEPENDENT_AMBULATORY_CARE_PROVIDER_SITE_OTHER): Payer: Medicare Other | Admitting: *Deleted

## 2017-01-03 DIAGNOSIS — I059 Rheumatic mitral valve disease, unspecified: Secondary | ICD-10-CM | POA: Diagnosis not present

## 2017-01-03 DIAGNOSIS — Z952 Presence of prosthetic heart valve: Secondary | ICD-10-CM

## 2017-01-03 DIAGNOSIS — I480 Paroxysmal atrial fibrillation: Secondary | ICD-10-CM

## 2017-01-03 LAB — POCT INR: INR: 2.6

## 2017-01-18 DIAGNOSIS — K4090 Unilateral inguinal hernia, without obstruction or gangrene, not specified as recurrent: Secondary | ICD-10-CM | POA: Diagnosis not present

## 2017-01-19 DIAGNOSIS — H04123 Dry eye syndrome of bilateral lacrimal glands: Secondary | ICD-10-CM | POA: Diagnosis not present

## 2017-01-19 DIAGNOSIS — D3141 Benign neoplasm of right ciliary body: Secondary | ICD-10-CM | POA: Diagnosis not present

## 2017-01-19 DIAGNOSIS — H353131 Nonexudative age-related macular degeneration, bilateral, early dry stage: Secondary | ICD-10-CM | POA: Diagnosis not present

## 2017-01-19 DIAGNOSIS — Z961 Presence of intraocular lens: Secondary | ICD-10-CM | POA: Diagnosis not present

## 2017-01-31 ENCOUNTER — Ambulatory Visit (INDEPENDENT_AMBULATORY_CARE_PROVIDER_SITE_OTHER): Payer: Medicare Other | Admitting: *Deleted

## 2017-01-31 DIAGNOSIS — Z952 Presence of prosthetic heart valve: Secondary | ICD-10-CM

## 2017-01-31 DIAGNOSIS — I480 Paroxysmal atrial fibrillation: Secondary | ICD-10-CM

## 2017-01-31 DIAGNOSIS — I059 Rheumatic mitral valve disease, unspecified: Secondary | ICD-10-CM

## 2017-01-31 LAB — POCT INR: INR: 2.4

## 2017-02-02 ENCOUNTER — Ambulatory Visit (INDEPENDENT_AMBULATORY_CARE_PROVIDER_SITE_OTHER): Payer: Medicare Other | Admitting: Cardiology

## 2017-02-02 ENCOUNTER — Encounter: Payer: Self-pay | Admitting: Cardiology

## 2017-02-02 VITALS — BP 170/80 | HR 66 | Ht 72.0 in | Wt 158.6 lb

## 2017-02-02 DIAGNOSIS — I480 Paroxysmal atrial fibrillation: Secondary | ICD-10-CM | POA: Diagnosis not present

## 2017-02-02 DIAGNOSIS — I351 Nonrheumatic aortic (valve) insufficiency: Secondary | ICD-10-CM | POA: Diagnosis not present

## 2017-02-02 DIAGNOSIS — I872 Venous insufficiency (chronic) (peripheral): Secondary | ICD-10-CM | POA: Diagnosis not present

## 2017-02-02 DIAGNOSIS — Z952 Presence of prosthetic heart valve: Secondary | ICD-10-CM

## 2017-02-02 NOTE — Patient Instructions (Signed)
Okay to use generic coumadin  ---- warfarin    No other changes     Your physician wants you to follow-up in 12 months with DR HARDING.You will receive a reminder letter in the mail two months in advance. If you don't receive a letter, please call our office to schedule the follow-up appointment.   If you need a refill on your cardiac medications before your next appointment, please call your pharmacy.

## 2017-02-02 NOTE — Progress Notes (Signed)
PCP: [name], MD  Clinic Note: Chief Complaint  Patient presents with  . Follow-up    yearly. - h.o MVR (replacement), AI  . Atrial Fibrillation    HPI: Richard Spears is a 83 y.o. male with a PMH below who presents today for Annual follow-up of atrial fibrillation and mitral valve replacement.  As of last year, he was exercising on the treadmill several times a week. He does notice it takes him a few minutes to get "go" on the treadmill. He previously saw Dr. Ilda Foil before seeing Dr. Lendell Caprice.  He has history of:  A. fib. Rate controlled on Zebeta warfarin for anticoagulation.  Status post mitral valve replacement mechanical. 1997  Venous deficiency, wears support stockings.  Mild orthostatic dizziness.  Moderate AI stable from 2012/ - 2017  Richard Spears was last seen on 01/12/2016. He was doing fairly well at that time. He was establishing a new cardiologist.  Recent Hospitalizations: none  Studies Personally Reviewed - (if available, images/films reviewed: From Epic Chart or Care Everywhere)  Transthoracic Echo August 2017: Mild concentric LVH. EF 55-60%. No RWMA. Moderate aortic regurgitation. Mechanical mitral valve prosthesis functioning properly. The LAD dilation.  Interval History: Richard Spears presents today doing quite well. He really does not have any significant complaints. He says occasionally he'll feel a little bit of dizziness when he first stands up if he doesn't to quickly. He simply knows that he needs to hold onto something when he stands up. He says his blood pressures usually in the 742V systolic at home, and is not sure why it so high today. He says he does cardiac rehabilitation Monday Wednesday and Friday and his blood pressure always good. He doesn't treadmill long he does some weights and some other aerobic exercises and denies any chest tightness pressure with rest or exertion. No sense of rapid irregular heartbeats or palpitations.  Every now and then he'll feel some brief episodes of feeling a little bit tired which he usually feels if he increases his level of activity some but these may be happening once or twice a week and last just for less than an hour. He thinks maybe this is when he is going in his A. Fib.  He has some mild puffy swelling at the end of the day mostly that he feels the varicose veins be swollen. He wears the support stockings and he keeps pretty much under control. He wears a sports stockings as much for the swelling is does to cover up the stasis changes.  Otherwise cardiac review of symptoms as follows: No chest pain or shortness of breath with rest or exertion. No PND, orthopnea or edema.  No palpitations, syncope/near syncope, or TIA/amaurosis fugax symptoms. No melena, hematochezia, hematuria, or epstaxis. No claudication.  ROS: A comprehensive was performed. Pertinent symptoms noted above Review of Systems  Constitutional: Positive for weight loss (He has been exercising more, but not intentional weight loss).  HENT: Negative for congestion.   Respiratory: Negative for cough, shortness of breath (Only when he first starts walking, but afterwards he is okay) and wheezing.   Genitourinary: Positive for frequency (Nocturia). Negative for dysuria.  Musculoskeletal: Positive for joint pain (Normal arthritis pains but nothing significant).  Endo/Heme/Allergies: Negative for environmental allergies.  Psychiatric/Behavioral: Negative for memory loss.  All other systems reviewed and are negative.  I have reviewed and (if needed) personally updated the patient's problem list, medications, allergies, past medical and surgical history, social and family history.  Past Medical History:  Diagnosis Date  . Anemia    leakoppenia  . BPH (benign prostatic hypertrophy)   . Bullous pemphigoid    [name], March 2011, right forearm squamous cell carcinoma  . Chronic anticoagulation    systemic  . Colon  polyp    transverse, 2002  . History of peptic ulcer    remote, 3/95  . Hx of actinic keratosis   . Hx of basal cell carcinoma   . Hx of squamous cell carcinoma of skin   . Hyperlipidemia   . Left inguinal hernia   . Moderate aortic insufficiency 2009   audible aortic insufficiency on 1/09 echo  . PAF (paroxysmal atrial fibrillation) ([place]) 01/17/2014   On Warfarin.  . S/P mitral valve replacement with metallic valve 83/3825   INR goal 2.5-3.5, St Jude,   . 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 Electrodesiccation and Curettage and Shave Biopsy Right    Right medial, anterio tibia: Well differentiated Squamous Cell  . hip replacements Left    10 years ago  . MITRAL VALVE REPLACEMENT  03/1996   St. Jude mechanical valve  . TRANSTHORACIC ECHOCARDIOGRAM  01/19/2011   EF 60-65%. No regional wall motion abnormality. Mechanical mitral valve in place. Moderate aortic regurgitation.  . TRANSTHORACIC ECHOCARDIOGRAM  01/2016    Mild concentric LVH. EF 55-60%. No RWMA. Moderate aortic regurgitation. Mechanical mitral valve prosthesis functioning properly. The LAD dilation.    Current Meds  Medication Sig  . bisoprolol (ZEBETA) 5 MG tablet TAKE 1 TABLET DAILY  . COUMADIN 5 MG tablet TAKE AS DIRECTED BY COUMADIN CLINIC  . fluticasone (FLONASE) 50 MCG/ACT nasal spray Place 2 sprays into both nostrils as needed for allergies or rhinitis.  . furosemide (LASIX) 40 MG tablet Take 1 tablet (40 mg total) by mouth 2 (two) times daily.  Marland Kitchen ketoconazole (NIZORAL) 2 % shampoo Apply 1 application topically 2 (two) times a week.  Marland Kitchen LORazepam (ATIVAN) 1 MG tablet Take 1 mg by mouth every 8 (eight) hours as needed for anxiety.   . Multiple Vitamins-Minerals (MULTIVITAMIN PO) Take 1 tablet by mouth daily.    Allergies  Allergen Reactions  . Flexeril [Cyclobenzaprine]     Diarrhea     Social History    Social History  . Marital status: Married    Spouse name: N/A  . Number of children: 2  . Years of education: N/A   Occupational History  . retired    Social History Main Topics  . Smoking status: Former Smoker    Packs/day: 1.00    Years: 8.00    Types: Cigarettes    Quit date: 10/31/1954  . Smokeless tobacco: Never Used  . Alcohol use 0.0 oz/week     Comment: 1-2 drinks per day  . Drug use: Unknown  . Sexual activity: Not Asked   Other Topics Concern  . None   Social History Narrative  . None  He moved to [place] last summer  family history includes COPD in his brother; Cancer in his brother; Hypertension in his mother; Lung cancer in his sister; Lupus in his son; Other in his sister.  Wt Readings from Last 3 Encounters:  02/02/17 158 lb 9.6 oz (71.9 kg)  01/12/16 162 lb (73.5 kg)  02/14/15 164 lb 12.8 oz (74.8 kg)    PHYSICAL EXAM BP (!) 170/80  Pulse 66   Ht 6' (1.829 m)   Wt 158 lb 9.6 oz (71.9 kg)   BMI 21.51 kg/m  Physical Exam  Constitutional: He is oriented to person, place, and time. He appears well-developed and well-nourished. No distress.  Appears to be younger than stated age  HENT:  Head: Normocephalic and atraumatic.  Eyes: EOM are normal.  Neck: Normal range of motion. Neck supple. No hepatojugular reflux and no JVD present. Carotid bruit is not present.  Cardiovascular: Normal rate, regular rhythm and intact distal pulses.   Occasional extrasystoles are present. PMI is not displaced.  Exam reveals no gallop and no friction rub.   Murmur heard.  Rumbling decrescendo holodiastolic murmur is present  at the upper left sternal border Crisp S1  Pulmonary/Chest: Effort normal and breath sounds normal. No respiratory distress. He has no wheezes. He has no rales.  Abdominal: Soft. Bowel sounds are normal. He exhibits no distension. There is no tenderness. There is no rebound.  Neurological: He is alert and oriented to person, place, and time.   Skin: Skin is warm and dry. No rash noted. No erythema.  Psychiatric: He has a normal mood and affect. His behavior is normal. Judgment and thought content normal.   66  Adult ECG Report  Rate: 66 ;  Rhythm: normal sinus rhythm; 1 AVB. Otherwise normal axis, intervals and durations  Narrative Interpretation: Stable EKG   Other studies Reviewed: Additional studies/ records that were reviewed today include:  Recent Labs:   Lab Results  Component Value Date   CREATININE 1.17 01/17/2007   BUN 16 01/17/2007   NA 137 01/17/2007   K 4.4 01/17/2007   CL 100 01/17/2007   CO2 29 01/17/2007   No results found for: CHOL, HDL, LDLCALC, LDLDIRECT, TRIG, CHOLHDL    ASSESSMENT / PLAN: Problem List Items Addressed This Visit    AI (aortic insufficiency) (Chronic)    He has had moderate aortic insufficiency since 2009 on echocardiogram. He really is not noticing significant exertional dyspnea. Very faint murmur on exam. Since this is stable, I think we may hold off on evaluating much like the mechanical valve for another couple years.       Relevant Orders   EKG 12-Lead (Completed)   Paroxysmal atrial fibrillation (HCC) (Chronic)    Completely asymptomatic. He may have episodes which correlate to his "tired spells "but pretty much has been stable for several years. He is on low-dose Zebeta for rate control. He is on Coumadin for his mitral valve and A. fib and stroke prophylaxis. Okay to convert to warfarin from Coumadin -> monitored by his PCP.      Relevant Orders   EKG 12-Lead (Completed)   S/P MVR (mitral valve replacement) - Primary (Chronic)    Normal functioning valve on most recent echocardiogram. I think we can probably check this every 2-3 years since it has been stable since 2012. On Coumadin/warfarin      Relevant Orders   EKG 12-Lead (Completed)   Venous insufficiency (Chronic)    Stable. Wears compression stockings. On low-dose standing Lasix and has not really had to  take any additional dosing. No heart failure symptoms associated with it.         Current medicines are reviewed at length with the patient today. (+/- concerns) - he questions if it is okay to convert from Coumadin to generic warfarin --> yes The following changes have been made: None  Patient Instructions  Okay to use generic coumadin  ----  warfarin    No other changes     Your physician wants you to follow-up in 12 months with DR HARDING.You will receive a reminder letter in the mail two months in advance. If you don't receive a letter, please call our office to schedule the follow-up appointment.   If you need a refill on your cardiac medications before your next appointment, please call your pharmacy.    Studies Ordered:   Orders Placed This Encounter  Procedures  . EKG 12-Lead      Glenetta Hew, M.D., M.S. Interventional Cardiologist   Pager # [phone] Phone # [phone] [address]. [place], [place] [address]

## 2017-02-04 ENCOUNTER — Encounter: Payer: Self-pay | Admitting: Cardiology

## 2017-02-04 NOTE — Assessment & Plan Note (Signed)
Stable. Wears compression stockings. On low-dose standing Lasix and has not really had to take any additional dosing. No heart failure symptoms associated with it.

## 2017-02-04 NOTE — Assessment & Plan Note (Signed)
Normal functioning valve on most recent echocardiogram. I think we can probably check this every 2-3 years since it has been stable since 2012. On Coumadin/warfarin

## 2017-02-04 NOTE — Assessment & Plan Note (Signed)
Completely asymptomatic. He may have episodes which correlate to his "tired spells "but pretty much has been stable for several years. He is on low-dose Zebeta for rate control. He is on Coumadin for his mitral valve and A. fib and stroke prophylaxis. Okay to convert to warfarin from Coumadin -> monitored by his PCP.

## 2017-02-04 NOTE — Assessment & Plan Note (Addendum)
He has had moderate aortic insufficiency since 2009 on echocardiogram. He really is not noticing significant exertional dyspnea. Very faint murmur on exam. Since this is stable, I think we may hold off on evaluating much like the mechanical valve for another couple years.

## 2017-02-10 ENCOUNTER — Encounter: Payer: Self-pay | Admitting: Cardiology

## 2017-02-11 ENCOUNTER — Telehealth: Payer: Self-pay | Admitting: *Deleted

## 2017-02-11 NOTE — Telephone Encounter (Signed)
Called the patient. He had sent an email in to [facility] inquiring about an appointment placed for him with Dr. Fletcher Anon on 8/21. He did not know anything about this and there is nothing about this in his charts. Per the patient request the appointment will be cancelled.

## 2017-02-14 ENCOUNTER — Ambulatory Visit (INDEPENDENT_AMBULATORY_CARE_PROVIDER_SITE_OTHER): Payer: Medicare Other | Admitting: *Deleted

## 2017-02-14 DIAGNOSIS — Z952 Presence of prosthetic heart valve: Secondary | ICD-10-CM

## 2017-02-14 DIAGNOSIS — I480 Paroxysmal atrial fibrillation: Secondary | ICD-10-CM | POA: Diagnosis not present

## 2017-02-14 DIAGNOSIS — I059 Rheumatic mitral valve disease, unspecified: Secondary | ICD-10-CM | POA: Diagnosis not present

## 2017-02-14 LAB — POCT INR: INR: 2.4

## 2017-02-15 ENCOUNTER — Ambulatory Visit: Payer: Medicare Other | Admitting: Cardiovascular Disease

## 2017-03-02 ENCOUNTER — Ambulatory Visit (INDEPENDENT_AMBULATORY_CARE_PROVIDER_SITE_OTHER): Payer: Medicare Other | Admitting: *Deleted

## 2017-03-02 DIAGNOSIS — Z952 Presence of prosthetic heart valve: Secondary | ICD-10-CM

## 2017-03-02 DIAGNOSIS — I059 Rheumatic mitral valve disease, unspecified: Secondary | ICD-10-CM | POA: Diagnosis not present

## 2017-03-02 DIAGNOSIS — I480 Paroxysmal atrial fibrillation: Secondary | ICD-10-CM | POA: Diagnosis not present

## 2017-03-02 LAB — POCT INR: INR: 3

## 2017-03-12 ENCOUNTER — Other Ambulatory Visit: Payer: Self-pay | Admitting: Cardiology

## 2017-03-14 ENCOUNTER — Telehealth: Payer: Self-pay | Admitting: Cardiology

## 2017-03-14 ENCOUNTER — Other Ambulatory Visit: Payer: Self-pay | Admitting: Pharmacist

## 2017-03-14 MED ORDER — COUMADIN 5 MG PO TABS: ORAL_TABLET | ORAL | 1 refills | Status: DC

## 2017-03-14 NOTE — Telephone Encounter (Signed)
Returned call to patient.He wanted Dr.Harding to know both feet red.Stated when he last saw Dr.Harding he ask him was his feet red like both lower legs.Stated at that time they were not red,but now they are red.Stated he has slight swelling but no worse.He wears compression socks.Advised I will send message to [place].

## 2017-03-14 NOTE — Telephone Encounter (Signed)
Rx(s) sent to pharmacy electronically.  

## 2017-03-14 NOTE — Telephone Encounter (Signed)
[name] is calling because he wants to know what is the  significance of the red color going to his feet . He has Venous Insuffiencey .

## 2017-03-15 NOTE — Telephone Encounter (Signed)
Returned call to patient no answer.Left Dr.Harding's recommendations on personal voice mail.

## 2017-03-15 NOTE — Telephone Encounter (Signed)
As long as swelling is stable & legs are not sore - soul be OK.  Continue compression socks & foot elevation.  Glenetta Hew, MD

## 2017-03-23 ENCOUNTER — Ambulatory Visit (INDEPENDENT_AMBULATORY_CARE_PROVIDER_SITE_OTHER): Payer: Medicare Other | Admitting: Pharmacist

## 2017-03-23 DIAGNOSIS — Z952 Presence of prosthetic heart valve: Secondary | ICD-10-CM

## 2017-03-23 DIAGNOSIS — I480 Paroxysmal atrial fibrillation: Secondary | ICD-10-CM

## 2017-03-23 DIAGNOSIS — I059 Rheumatic mitral valve disease, unspecified: Secondary | ICD-10-CM | POA: Diagnosis not present

## 2017-03-23 LAB — POCT INR: INR: 2.9

## 2017-04-19 ENCOUNTER — Telehealth: Payer: Self-pay | Admitting: Cardiology

## 2017-04-19 ENCOUNTER — Ambulatory Visit (INDEPENDENT_AMBULATORY_CARE_PROVIDER_SITE_OTHER): Payer: Medicare Other | Admitting: Pharmacist Clinician (PhC)/ Clinical Pharmacy Specialist

## 2017-04-19 ENCOUNTER — Encounter: Payer: Self-pay | Admitting: Physician Assistant

## 2017-04-19 ENCOUNTER — Ambulatory Visit (INDEPENDENT_AMBULATORY_CARE_PROVIDER_SITE_OTHER): Payer: Medicare Other | Admitting: Physician Assistant

## 2017-04-19 VITALS — BP 146/83 | HR 82 | Ht 72.0 in | Wt 156.8 lb

## 2017-04-19 DIAGNOSIS — I480 Paroxysmal atrial fibrillation: Secondary | ICD-10-CM | POA: Diagnosis not present

## 2017-04-19 DIAGNOSIS — I10 Essential (primary) hypertension: Secondary | ICD-10-CM

## 2017-04-19 DIAGNOSIS — I059 Rheumatic mitral valve disease, unspecified: Secondary | ICD-10-CM | POA: Diagnosis not present

## 2017-04-19 DIAGNOSIS — R0609 Other forms of dyspnea: Secondary | ICD-10-CM | POA: Diagnosis not present

## 2017-04-19 DIAGNOSIS — Z952 Presence of prosthetic heart valve: Secondary | ICD-10-CM

## 2017-04-19 LAB — POCT INR: INR: 4.2

## 2017-04-19 MED ORDER — ISOSORBIDE MONONITRATE ER 30 MG PO TB24: 30.0000 mg | ORAL_TABLET | Freq: Every day | ORAL | 3 refills | Status: DC

## 2017-04-19 NOTE — Telephone Encounter (Signed)
Spoke with patient wife and blood pressure has been running up 180's/90's since 04/11/17, requested office visit. Scheduled appointment with Donnie Aho PA for today. Aware of location

## 2017-04-19 NOTE — Patient Instructions (Addendum)
Medication Instructions:  START ISOSORBIDE 30MG  1/2 TABLET DAILY FOR 4 DAYS THEN 1 TABLET If you need a refill on your cardiac medications before your next appointment, please call your pharmacy.  Testing/Procedures: Your physician has requested that you have an echocardiogram(NEXT WEEK-PER RHONDA). Echocardiography is a painless test that uses sound waves to create images of your heart. It provides your doctor with information about the size and shape of your heart and how well your heart's chambers and valves are working. This procedure takes approximately one hour. There are no restrictions for this procedure.  Special Instructions: LOG BP AND HEART RATE DAILY  Follow-Up: Your physician wants you to follow-up in: 1ST AVAILABLE WITH DR HARDING.  Thank you for choosing [facility] at [facility]!!

## 2017-04-19 NOTE — Telephone Encounter (Signed)
New message  Pt c/o BP issue: STAT if pt c/o blurred vision, one-sided weakness or slurred speech  1. What are your last 5 BP readings? 181/97  2. Are you having any other symptoms (ex. Dizziness, headache, blurred vision, passed out)? no  3. What is your BP issue? Pt feel weak

## 2017-04-19 NOTE — Progress Notes (Signed)
Cardiology Office Note   Date:  04/19/2017   ID:  Richard Spears, DOB Jan 20, 1926, MRN [id number]  PCP:  [name], MD  Cardiologist:   Dr. Ellyn Hack, 02/02/2017 Richard Ferries, PA-C   Chief Complaint  Patient presents with  . Hypertension  . Shortness of Breath    on exertion  . Stress    History of Present Illness: Richard Spears is a 83 y.o. male with a history of PAF, CHADS2VASC=2 (age x 2) on coumadin, mech MVR, venous insuff, mod AI  08/08 office visit, volume status was good, weight 158 pounds, no urgent need for testing 10/23 phone note regarding hypertension and weakness, appointment made  Richard Spears presents for cardiology evaluation and follow up.  Pt was in [place] until he was preparing to go to [place] for the burial ceremony of his oldest son. His wife was not able to go. The trip was extremely stressful for him. He doesn't think he was sleeping that well. He was very worried about things not going well. He noticed his blood pressure was running a little higher than usual.  He started feeling tired and listless. He then developed worsening dyspnea on exertion. He got to the point where he could only walk about 50 feet without having to stop to rest. During the period of time he was in [place], he missed 4 doses of his Lasix because of scheduling issues and social events. He is generally compliant with his Lasix and watches his diet very carefully.  Since he came home, his symptoms have gotten a little bit better. He is compliant with his Lasix and compliant with his diet. However, despite diet and medication compliance, his blood pressure has been running much higher than usual. He got readings of 181/97 and 188/90+. Today, he got 155/90.  Here in the office today, his blood pressure is above target but not bad at 146/85. His heart rate is 82. He is asymptomatic.  He has not had chest pain. He has not had palpitations and doesn't think he  has had any atrial fibrillation.  He has not had any bleeding issues. His Coumadin was therapeutic when it was checked last, he is due for a check tomorrow we will try to obtain this today. Patient lives at friend's home [place]    Past Medical History:  Diagnosis Date  . Anemia    leakoppenia  . BPH (benign prostatic hypertrophy)   . Bullous pemphigoid    [name], March 2011, right forearm squamous cell carcinoma  . Chronic anticoagulation    systemic  . Colon polyp    transverse, 2002  . History of peptic ulcer    remote, 3/95  . Hx of actinic keratosis   . Hx of basal cell carcinoma   . Hx of squamous cell carcinoma of skin   . Hyperlipidemia   . Left inguinal hernia   . Moderate aortic insufficiency 2009   audible aortic insufficiency on 1/09 echo  . PAF (paroxysmal atrial fibrillation) ([place]) 01/17/2014   On Warfarin.  . S/P mitral valve replacement with metallic valve 81/2751   INR goal 2.5-3.5, St Jude,   . 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 Electrodesiccation and Curettage and Shave Biopsy Right    Right medial, anterio tibia: Well differentiated Squamous Cell  . hip replacements Left    10 years  ago  . MITRAL VALVE REPLACEMENT  03/1996   St. Jude mechanical valve  . TRANSTHORACIC ECHOCARDIOGRAM  01/19/2011   EF 60-65%. No regional wall motion abnormality. Mechanical mitral valve in place. Moderate aortic regurgitation.  . TRANSTHORACIC ECHOCARDIOGRAM  01/2016    Mild concentric LVH. EF 55-60%. No RWMA. Moderate aortic regurgitation. Mechanical mitral valve prosthesis functioning properly. The LAD dilation.    Current Outpatient Prescriptions  Medication Sig Dispense Refill  . bisoprolol (ZEBETA) 5 MG tablet Take 1 tablet (5 mg total) by mouth daily. 90 tablet 3  . COUMADIN 5 MG tablet Take 1 to 1 and 1/2 tablets daily as directed by coumadin clinic 120 tablet 1  .  fluticasone (FLONASE) 50 MCG/ACT nasal spray Place 2 sprays into both nostrils as needed for allergies or rhinitis.    . furosemide (LASIX) 40 MG tablet Take 1 tablet (40 mg total) by mouth 2 (two) times daily. 180 tablet 1  . ketoconazole (NIZORAL) 2 % shampoo Apply 1 application topically 2 (two) times a week.    Marland Kitchen LORazepam (ATIVAN) 1 MG tablet Take 1 mg by mouth every 8 (eight) hours as needed for anxiety.     . Multiple Vitamins-Minerals (MULTIVITAMIN PO) Take 1 tablet by mouth daily.    . isosorbide mononitrate (IMDUR) 30 MG 24 hr tablet Take 1 tablet (30 mg total) by mouth daily. 1/2 TAB FOR 4 DAYS THEN 1 TAB 30 tablet 3   No current facility-administered medications for this visit.     Allergies:   Flexeril [cyclobenzaprine]    Social History:  The patient  reports that he quit smoking about 62 years ago. His smoking use included Cigarettes. He has a 8.00 pack-year smoking history. He has never used smokeless tobacco. He reports that he drinks alcohol.   Family History:  The patient's family history includes COPD in his brother; Cancer in his brother; Hypertension in his mother; Lung cancer in his sister; Lupus in his son; Other in his sister.    ROS:  Please see the history of present illness. All other systems are reviewed and negative.    PHYSICAL EXAM: VS:  BP (!) 146/83   Pulse 82   Ht 6' (1.829 m)   Wt 156 lb 12.8 oz (71.1 kg)   BMI 21.27 kg/m  , BMI Body mass index is 21.27 kg/m. GEN: Well nourished, well developed, male in no acute distress  HEENT: normal for age  Neck: Minimal JVD, no carotid bruit, no masses Cardiac: RRR; soft systolic murmur, crisp valve click, short diastolic murmur, no rubs, or gallops Respiratory:  clear to auscultation bilaterally, normal work of breathing GI: soft, nontender, nondistended, + BS MS: no deformity or atrophy; no edema; distal pulses are 2+ in all 4 extremities   Skin: warm and dry, no rash Neuro:  Strength and sensation are  intact Psych: euthymic mood, full affect   EKG:  EKG is not ordered today.  ECHO: 01/27/2016 - Left ventricle: The cavity size was normal. There was mild   concentric hypertrophy. Systolic function was normal. The   estimated ejection fraction was in the range of 55% to 60%. Wall   motion was normal; there were no regional wall motion   abnormalities. - Aortic valve: Transvalvular velocity was within the normal range.   There was no stenosis. There was moderate regurgitation. - Mitral valve: A mechanical prosthesis was present. Mean gradient   (D): 3 mm Hg. Valve area by continuity equation (using LVOT  flow): 1.97 cm^2. - Left atrium: The atrium was severely dilated. - Right ventricle: The cavity size was normal. Wall thickness was   normal. Systolic function was normal. - Tricuspid valve: There was mild regurgitation. - Pulmonary arteries: Systolic pressure was within the normal   range. PA peak pressure: 34 mm Hg (S).  Recent Labs: No results found for requested labs within last 8760 hours.    Lipid Panel No results found for: CHOL, TRIG, HDL, CHOLHDL, VLDL, LDLCALC, LDLDIRECT   Wt Readings from Last 3 Encounters:  04/19/17 156 lb 12.8 oz (71.1 kg)  02/02/17 158 lb 9.6 oz (71.9 kg)  01/12/16 162 lb (73.5 kg)    Other studies Reviewed: Additional studies/ records that were reviewed today include: Office notes hospital records and testing.  ASSESSMENT AND PLAN:  1.  Dyspnea on exertion: His weight is actually down from previous values and he does not have volume overload by exam. Continue current Lasix dose. I am concerned that this could represent an anginal equivalent. He was under both physical and emotional stress during that time. With the change in his dyspnea on exertion, we'll check an echocardiogram.  The patient prefers not to schedule a stress test or heart catheterization at this time. He will follow-up with Dr. Ellyn Hack to discuss if an ischemic evaluation is  needed. He was told that if his echocardiogram is significantly abnormal, he will need a heart cath.  2. S/p MVR: By reading Dr. Allison Quarry notes, I do not believe his physical exam has changed. I do not believe there has been any change in the functioning of his valves. With the change in his symptoms, we'll check an echocardiogram to make sure.  3. Hypertension: I discussed that his blood pressure had been higher than normal for him and we needed to make sure that it got back down, but he was not in acute danger of having a stroke from that blood pressure. His blood pressure is improved now that his life and schedule are more normal. He may have some diastolic dysfunction and may benefit from Imdur. I will add this to his medication regimen and see if he tolerates it. He is to follow his blood pressure and heart rate and bring those records when he comes back in to see Dr. Ellyn Hack.  4. PAF: He was not having any palpitations during this period of time. He did not document his heart rate. Once he was able to check his blood pressure and heart rate, his heart rate was normal. Continue bisoprolol.   Current medicines are reviewed at length with the patient today.  The patient does not have concerns regarding medicines.  The following changes have been made:  Add Imdur  Labs/ tests ordered today include:   Orders Placed This Encounter  Procedures  . ECHOCARDIOGRAM COMPLETE     Disposition:   FU with Dr. Ellyn Hack  Signed, Richard Ferries, PA-C  04/19/2017 4:38 PM    [place] Phone: [phone]; Fax: [phone]  This note was written with the assistance of speech recognition software. Please excuse any transcriptional errors.

## 2017-04-21 ENCOUNTER — Other Ambulatory Visit: Payer: Self-pay

## 2017-04-21 ENCOUNTER — Ambulatory Visit (HOSPITAL_COMMUNITY): Payer: Medicare Other | Attending: Cardiovascular Disease

## 2017-04-21 DIAGNOSIS — I4891 Unspecified atrial fibrillation: Secondary | ICD-10-CM | POA: Insufficient documentation

## 2017-04-21 DIAGNOSIS — I082 Rheumatic disorders of both aortic and tricuspid valves: Secondary | ICD-10-CM | POA: Insufficient documentation

## 2017-04-21 DIAGNOSIS — I872 Venous insufficiency (chronic) (peripheral): Secondary | ICD-10-CM | POA: Diagnosis not present

## 2017-04-21 DIAGNOSIS — R0609 Other forms of dyspnea: Secondary | ICD-10-CM | POA: Diagnosis not present

## 2017-04-21 DIAGNOSIS — Z9889 Other specified postprocedural states: Secondary | ICD-10-CM | POA: Diagnosis not present

## 2017-04-21 DIAGNOSIS — I371 Nonrheumatic pulmonary valve insufficiency: Secondary | ICD-10-CM | POA: Diagnosis not present

## 2017-04-21 DIAGNOSIS — Z952 Presence of prosthetic heart valve: Secondary | ICD-10-CM | POA: Diagnosis not present

## 2017-04-21 DIAGNOSIS — E785 Hyperlipidemia, unspecified: Secondary | ICD-10-CM | POA: Insufficient documentation

## 2017-04-21 DIAGNOSIS — I059 Rheumatic mitral valve disease, unspecified: Secondary | ICD-10-CM | POA: Diagnosis present

## 2017-04-22 ENCOUNTER — Telehealth: Payer: Self-pay | Admitting: Physician Assistant

## 2017-04-22 MED ORDER — ISOSORBIDE MONONITRATE ER 30 MG PO TB24: 15.0000 mg | ORAL_TABLET | Freq: Every day | ORAL | 3 refills | Status: DC

## 2017-04-22 NOTE — Telephone Encounter (Signed)
Pt will continue isosorbide 15mg  (1/2 of 30mg )and continue taking BP daily and keep [place] updated with results. Pt aware to call back if BP elevated.

## 2017-04-22 NOTE — Telephone Encounter (Signed)
New message  Patient calling to report BP.  Wants to know if patient should continue isosorbide mononitrate (IMDUR) 30 MG 24 hr tablet or should dosage be changed. Please call   1. What are your last 5 BP readings? 121/58 pulse 84    Pt c/o medication issue:  1. Name of Medication: isosorbide mononitrate (IMDUR) 30 MG 24 hr tablet  2. How are you currently taking this medication (dosage and times per day)? As prescribed  3. Are you having a reaction (difficulty breathing--STAT)? NO  4. What is your medication issue?  Wants to know if patient should continue since BP has decreased

## 2017-05-12 ENCOUNTER — Ambulatory Visit (INDEPENDENT_AMBULATORY_CARE_PROVIDER_SITE_OTHER): Payer: Medicare Other | Admitting: Pharmacist

## 2017-05-12 ENCOUNTER — Ambulatory Visit (INDEPENDENT_AMBULATORY_CARE_PROVIDER_SITE_OTHER): Payer: Medicare Other | Admitting: Cardiology

## 2017-05-12 ENCOUNTER — Encounter: Payer: Self-pay | Admitting: Cardiology

## 2017-05-12 VITALS — BP 170/88 | HR 86 | Ht 72.0 in | Wt 159.0 lb

## 2017-05-12 DIAGNOSIS — E782 Mixed hyperlipidemia: Secondary | ICD-10-CM

## 2017-05-12 DIAGNOSIS — R0609 Other forms of dyspnea: Secondary | ICD-10-CM

## 2017-05-12 DIAGNOSIS — I480 Paroxysmal atrial fibrillation: Secondary | ICD-10-CM | POA: Diagnosis not present

## 2017-05-12 DIAGNOSIS — I10 Essential (primary) hypertension: Secondary | ICD-10-CM | POA: Diagnosis not present

## 2017-05-12 DIAGNOSIS — Z952 Presence of prosthetic heart valve: Secondary | ICD-10-CM

## 2017-05-12 DIAGNOSIS — I351 Nonrheumatic aortic (valve) insufficiency: Secondary | ICD-10-CM

## 2017-05-12 DIAGNOSIS — I059 Rheumatic mitral valve disease, unspecified: Secondary | ICD-10-CM | POA: Diagnosis not present

## 2017-05-12 LAB — POCT INR: INR: 3.1

## 2017-05-12 MED ORDER — HYDRALAZINE HCL 25 MG PO TABS: 25.0000 mg | ORAL_TABLET | Freq: Two times a day (BID) | ORAL | 6 refills | Status: DC

## 2017-05-12 NOTE — Progress Notes (Addendum)
PCP: [name], MD  Clinic Note: Chief Complaint  Patient presents with  . Follow-up    Shortness of breath;   . Atrial Fibrillation    Status post mitral valve replacement in 1997    HPI: Richard Spears is a 83 y.o. male with a PMH below who presents today for 3-week follow-up for atrial fibrillation history of mitral valve replacement with some shortness of breath He previously saw Dr. Ilda Foil before seeing Dr. Lendell Caprice.  He has history of:  A. fib. Rate controlled on Zebeta warfarin for anticoagulation. CHADS2VASC=2 (age x 2) Status post mitral valve replacement mechanical. 1997  Venous deficiency, wears support stockings.  Mild orthostatic dizziness.  Moderate AI stable from 2012/ - 2017  Richard Spears was last seen by Rosaria Ferries on April 19, 2017 -2 weeks after I last saw him and he was doing well..  When he saw Suanne Marker, he noted exertional dyspnea -> he was on a trip to [facility] to bury his son who passed away this 11/12/22.  This trip was extremely stressful and fatiguing for him.  He did not sleep well was quite worried and his wife is not able to go with him.  Shortly after this trip he began to feel tired and listless and is developing worsening exertional dyspnea.  He was only able to walk roughly 50 feet without stopping.  Apparently he had missed a few doses of Lasix because of scheduling issues. He actually has only been taking his Lasix once daily as opposed to times a day.  Recent Hospitalizations: None  Studies Personally Reviewed - (if available, images/films reviewed: From Epic Chart or Care Everywhere)  Transthoracic Echo October 2018: EF 55-60%.  Moderate aortic regurgitation.  Bileaflet Saint Jude mechanical mitral valve with no paravalvular leak.  Severe LA dilation.  Minimally elevated pulmonary pressures.  Interval History: Brice returns today stating that he is notably less short of breath than he had been, but still  is having some exertional dyspnea.  He notes his blood pressure is still high and he still has shortness of breath.  He says that his blood pressure usually fine in the morning but is significantly elevated in the evening.  He really did not describe any PND or orthopnea type symptoms, and has not had significant edema.  At no time has he noticed any resting or exertional chest tightness or pressure.  He denies any rapid irregular heartbeats or palpitations beyond a few skipped beats.  No syncope/near syncope or TIA/amaurosis fugax.   \No claudication  ROS: A comprehensive was performed. Review of Systems  Constitutional: Positive for malaise/fatigue (Steadily improving).  HENT: Negative for nosebleeds.   Respiratory: Positive for shortness of breath (Steadily improving, mostly exertional now.). Negative for cough and hemoptysis.   Cardiovascular: Positive for leg swelling. Negative for chest pain.  Gastrointestinal: Negative for blood in stool.  Genitourinary: Negative for hematuria and urgency.  Musculoskeletal: Positive for back pain and joint pain. Negative for myalgias.  Psychiatric/Behavioral: Positive for depression. Negative for memory loss. The patient is nervous/anxious. The patient does not have insomnia.   All other systems reviewed and are negative.   I have reviewed and (if needed) personally updated the patient's problem list, medications, allergies, past medical and surgical history, social and family history.   Past Medical History:  Diagnosis Date  . Anemia    leakoppenia  . BPH (benign prostatic hypertrophy)   . Bullous pemphigoid    Wilhemina Spears,  March 2011, right forearm squamous cell carcinoma  . Chronic anticoagulation    systemic  . Colon polyp    transverse, 2002  . History of peptic ulcer    remote, 3/95  . Hx of actinic keratosis   . Hx of basal cell carcinoma   . Hx of squamous cell carcinoma of skin   . Hyperlipidemia   . Left inguinal hernia   .  Moderate aortic insufficiency 2009   audible aortic insufficiency on 1/09 echo  . PAF (paroxysmal atrial fibrillation) (Richard Spears) 01/17/2014   On Warfarin.  . S/P mitral valve replacement with metallic valve 21/1941   INR goal 2.5-3.5, St Jude,   . 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 Electrodesiccation and Curettage and Shave Biopsy Right    Right medial, anterio tibia: Well differentiated Squamous Cell  . hip replacements Left    10 years ago  . MITRAL VALVE REPLACEMENT  03/1996   St. Jude mechanical valve  . TRANSTHORACIC ECHOCARDIOGRAM  01/19/2011   EF 60-65%. No regional wall motion abnormality. Mechanical mitral valve in place. Moderate aortic regurgitation.  . TRANSTHORACIC ECHOCARDIOGRAM  01/2016    Mild concentric LVH. EF 55-60%. No RWMA. Moderate aortic regurgitation. Mechanical mitral valve prosthesis functioning properly. The LAD dilation.    Current Meds  Medication Sig  . bisoprolol (ZEBETA) 5 MG tablet Take 1 tablet (5 mg total) by mouth daily.  Marland Kitchen COUMADIN 5 MG tablet Take 1 to 1 and 1/2 tablets daily as directed by coumadin clinic  . fluticasone (FLONASE) 50 MCG/ACT nasal spray Place 2 sprays into both nostrils as needed for allergies or rhinitis.  . furosemide (LASIX) 40 MG tablet Take 1 tablet (40 mg total) by mouth 2 (two) times daily.  . isosorbide mononitrate (IMDUR) 30 MG 24 hr tablet Take 0.5 tablets (15 mg total) by mouth daily. 1/2 TAB FOR 4 DAYS THEN 1 TAB (Patient taking differently: Take 15 mg daily by mouth. )  . ketoconazole (NIZORAL) 2 % shampoo Apply 1 application topically 2 (two) times a week.  Marland Kitchen LORazepam (ATIVAN) 1 MG tablet Take 1 mg by mouth every 8 (eight) hours as needed for anxiety.   . Multiple Vitamins-Minerals (MULTIVITAMIN PO) Take 1 tablet by mouth daily.    Allergies  Allergen Reactions  . Flexeril [Cyclobenzaprine] Diarrhea    Social History     Socioeconomic History  . Marital status: Married    Spouse name: None  . Number of children: 2  . Years of education: None  . Highest education level: None  Social Needs  . Financial resource strain: None  . Food insecurity - worry: None  . Food insecurity - inability: None  . Transportation needs - medical: None  . Transportation needs - non-medical: None  Occupational History  . Occupation: retired  Tobacco Use  . Smoking status: Former Smoker    Packs/day: 1.00    Years: 8.00    Pack years: 8.00    Types: Cigarettes    Last attempt to quit: 10/31/1954    Years since quitting: 62.5  . Smokeless tobacco: Never Used  Substance and Sexual Activity  . Alcohol use: Yes    Alcohol/week: 0.0 oz    Comment: 1-2 drinks per day  . Drug use: None  . Sexual activity: None  Other Topics Concern  . None  Social History Narrative   Patient  lives at [facility], With his wife.    family history includes COPD in his brother; Cancer in his brother; Hypertension in his mother; Lung cancer in his sister; Lupus in his son; Other in his sister.  Wt Readings from Last 3 Encounters:  05/12/17 159 lb (72.1 kg)  04/19/17 156 lb 12.8 oz (71.1 kg)  02/02/17 158 lb 9.6 oz (71.9 kg)    PHYSICAL EXAM BP (!) 170/88   Pulse 86   Ht 6' (1.829 m)   Wt 159 lb (72.1 kg)   BMI 21.56 kg/m  Physical Exam  Constitutional: He is oriented to person, place, and time. He appears well-developed and well-nourished. No distress.  Appears younger than his stated age  HENT:  Head: Normocephalic and atraumatic.  Mouth/Throat: No oropharyngeal exudate.  Eyes: Conjunctivae and EOM are normal. No scleral icterus.  Neck: Neck supple. No hepatojugular reflux and no JVD present. Carotid bruit is not present.  Cardiovascular: Normal rate, regular rhythm and intact distal pulses.  Occasional extrasystoles are present. PMI is not displaced. Exam reveals no gallop.  Murmur heard.  Low-pitched rumbling  holosystolic murmur is present with a grade of 1/6 at the upper left sternal border and lower left sternal border. Crisp metallic S1  Pulmonary/Chest: Effort normal and breath sounds normal. No respiratory distress. He has no wheezes. He has no rales.  Abdominal: Soft. Bowel sounds are normal. He exhibits no distension. There is no tenderness. There is no rebound.  Musculoskeletal: Normal range of motion. He exhibits no edema.  Neurological: He is alert and oriented to person, place, and time.  Skin: Skin is dry.  Psychiatric: He has a normal mood and affect. His behavior is normal. Judgment and thought content normal.  Nursing note and vitals reviewed.   Adult ECG Report N/A  Other studies Reviewed: Additional studies/ records that were reviewed today include:  Recent Labs:   Lab Results  Component Value Date   CREATININE 1.17 01/17/2007   BUN 16 01/17/2007   NA 137 01/17/2007   K 4.4 01/17/2007   CL 100 01/17/2007   CO2 29 01/17/2007   No results found for: CHOL, HDL, LDLCALC, LDLDIRECT, TRIG, CHOLHDL  ASSESSMENT / PLAN: Exertional dyspnea seems to be improved.  Part of the issue now is that his blood pressure is extremely high.  I would like to reduce blood pressure by adding at least twice daily hydralazine for the afternoon timeframe and his blood pressures going high.  He is only on bisoprolol which is not controlling his blood pressure.  Will eventually need to add better afterload reduction than simply isosorbide mononitrate.  We will reassess blood pressure control and a month or so.  Problem List Items Addressed This Visit    AI (aortic insufficiency) (Chronic)    Moderate AI on echocardiogram, now with some exertional dyspnea in the setting of hypertension.  Minimal murmur heard on exam.  We will hold off on following up echocardiogram unless symptoms do not get better with increased blood pressure control.      Relevant Medications   hydrALAZINE (APRESOLINE) 25 MG  tablet   Essential hypertension - Primary (Chronic)    Somewhat labile blood pressures with elevated blood pressures in the evenings.  This probably has something to do with the fact that heis only on bisoprolol. Plan for now will be to add twice daily hydralazine with doses in the afternoon and evening --  this will allow for coverage in the pressures tend to  be high.  We will need to reassess and then consider a better long-term option, but with the presence of the Imdur, and using Imdur plus hydralazine for afterload reduction.      Relevant Medications   hydrALAZINE (APRESOLINE) 25 MG tablet   Exertional dyspnea    I checked an echocardiogram last year to assess any cardiac etiology for dyspnea besides hypertension.  Nothing really showed different.  For now I think adequate blood pressure control is probably the best option as mentioned elsewhere.      Hyperlipidemia (Chronic)    Not currently on medications -> followed by PCP      Relevant Medications   hydrALAZINE (APRESOLINE) 25 MG tablet   Mitral valve disorder (Chronic)    Status post mitral valve replacement with crisp valve.  Reassess echo probably next year.      Relevant Medications   hydrALAZINE (APRESOLINE) 25 MG tablet   Paroxysmal atrial fibrillation (HCC) (Chronic)    Completely asymptomatic.  He seems to be in A. fib currently.  On warfarin for his mitral valve already. On stable dose of beta-blocker for rate control.      Relevant Medications   hydrALAZINE (APRESOLINE) 25 MG tablet   S/P MVR (mitral valve replacement) (Chronic)      Current medicines are reviewed at length with the patient today. (+/- concerns) BP is still elevated. The following changes have been made: = add Hydralazine for episodes or Ortho Hypotension.  Patient Instructions  NEW MEDICATION INSTRUCTIONS  --START TAKING HYDRALAZINE 25 MG TWICE A DAY AT LUNCH TIME AND AFTER DINNER. IF BLOOD PRESSURE IS GREATER 330 SYSTOLIC MAY TAKE  AN EXTRA DOSE 25 MG HYDRALAZINE IF NEEDED.    Your physician recommends that you schedule a follow-up appointment in 2 [place].  If you need a refill on your cardiac medications before your next appointment, please call your pharmacy.     Studies Ordered:   No orders of the defined types were placed in this encounter.     Glenetta Hew, M.D., M.S. Interventional Cardiologist   Pager # [phone] Phone # [phone] [address] [place], [place] [address]

## 2017-05-12 NOTE — Patient Instructions (Addendum)
NEW MEDICATION INSTRUCTIONS  --START TAKING HYDRALAZINE 25 MG TWICE A DAY AT LUNCH TIME AND AFTER DINNER. IF BLOOD PRESSURE IS GREATER 417 SYSTOLIC MAY TAKE AN EXTRA DOSE 25 MG HYDRALAZINE IF NEEDED.    Your physician recommends that you schedule a follow-up appointment in 2 [place].  If you need a refill on your cardiac medications before your next appointment, please call your pharmacy.

## 2017-05-13 ENCOUNTER — Telehealth: Payer: Self-pay | Admitting: Cardiology

## 2017-05-13 NOTE — Telephone Encounter (Signed)
F/U Call :  Patient calling back, states that he would like to know if he should take hydralazine as well as imdur. Patient does not recall the previous conversation with our office.

## 2017-05-13 NOTE — Telephone Encounter (Signed)
Returned the call to the patient. He was recently prescribed Hydralazine 25 mg bid which he will start tonight. He has a concern that his blood pressure may drop too low with being on the Hydralazine and Imdur 15 mg. He has been instructed to check his blood pressure on a daily basis. If it is dropping low then he may call the office back and the medication may need to be reevaluated. He verbalized his understanding.

## 2017-05-13 NOTE — Telephone Encounter (Signed)
Returned the call to the patient to inform him that he did need to take both medications as the Imdur had not been discontinued. He will continue to check his blood pressure and call the office back with an update.

## 2017-05-13 NOTE — Telephone Encounter (Signed)
New Message     Pt c/o medication issue:  1. Name of Medication:  isosorbide mononitrate (IMDUR) 30 MG 24 hr tablet Take 0.5 tablets (15 mg total) by mouth daily. 1/2 TAB FOR 4 DAYS THEN 1 TAB Patient taking differently: Take 15 mg daily by mouth.     2. How are you currently taking this medication (dosage and times per day)? 1/2 tablet   3. Are you having a reaction (difficulty breathing--STAT)?  no  4. What is your medication issue?  Is he suppose to continue taking this since you changed his medication?

## 2017-05-14 ENCOUNTER — Encounter: Payer: Self-pay | Admitting: Cardiology

## 2017-05-15 NOTE — Assessment & Plan Note (Signed)
Completely asymptomatic.  He seems to be in A. fib currently.  On warfarin for his mitral valve already. On stable dose of beta-blocker for rate control.

## 2017-05-15 NOTE — Assessment & Plan Note (Signed)
I checked an echocardiogram last year to assess any cardiac etiology for dyspnea besides hypertension.  Nothing really showed different.  For now I think adequate blood pressure control is probably the best option as mentioned elsewhere.

## 2017-05-15 NOTE — Assessment & Plan Note (Signed)
Somewhat labile blood pressures with elevated blood pressures in the evenings.  This probably has something to do with the fact that heis only on bisoprolol. Plan for now will be to add twice daily hydralazine with doses in the afternoon and evening --  this will allow for coverage in the pressures tend to be high.  We will need to reassess and then consider a better long-term option, but with the presence of the Imdur, and using Imdur plus hydralazine for afterload reduction.

## 2017-05-15 NOTE — Assessment & Plan Note (Signed)
Moderate AI on echocardiogram, now with some exertional dyspnea in the setting of hypertension.  Minimal murmur heard on exam.  We will hold off on following up echocardiogram unless symptoms do not get better with increased blood pressure control.

## 2017-05-15 NOTE — Assessment & Plan Note (Signed)
Status post mitral valve replacement with crisp valve.  Reassess echo probably next year.

## 2017-05-15 NOTE — Assessment & Plan Note (Signed)
Not currently on medications -> followed by PCP

## 2017-05-16 ENCOUNTER — Telehealth: Payer: Self-pay | Admitting: Cardiology

## 2017-05-16 NOTE — Telephone Encounter (Signed)
New message     Pt c/o medication issue:  1. Name of Medication: hydrALAZINE (APRESOLINE) 25 MG tablet  2. How are you currently taking this medication (dosage and times per day)? Take 1 tablet (25 mg total) 2 (two) times daily by mouth. MAY TAKE AN EXTRA 25 MG TABLET IF SYSTOLIC BLOOD PRESSURE IS >180,IF NEEDED  3. Are you having a reaction (difficulty breathing--STAT)? no  4. What is your medication issue? Feels BP is too low   Pt c/o BP issue:  1. What are your last 5 BP readings? 113/51, 131/75, 139/70,  2. Are you having any other symptoms (ex. Dizziness, headache, blurred vision, passed out)? Tired, low energy 3. What is your medication issue? Feels BP is too low

## 2017-05-16 NOTE — Telephone Encounter (Signed)
Spoke with pt wife, she reports since the patient started the hydralazine on Friday last week they think his bp has been too low.the highest reading has been 150/80 and the lowest has been 113/51. 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 He had to lie down. Will forward for dr harding's review and advise.

## 2017-05-17 MED ORDER — HYDRALAZINE HCL 25 MG PO TABS: 25.0000 mg | ORAL_TABLET | Freq: Every day | ORAL | 6 refills | Status: DC | PRN

## 2017-05-17 NOTE — Telephone Encounter (Signed)
F/u Message  Pt c/o medication issue:  1. Name of Medication: Hydralazine   2. How are you currently taking this medication (dosage and times per day)? 25mg    3. Are you having a reaction (difficulty breathing--STAT)?   4. What is your medication issue? Per pt wife pt did not take medication last night and pt wife states he is doing fine without. Please call back to discuss

## 2017-05-17 NOTE — Telephone Encounter (Signed)
SPOKE TO PATIENT  INFORMATION GIVEN AWARE TO USE HYDRALAZINE AS NEEDED FOR B/P >180 SBP, DO NOT USE ON A DAILY BASIS. MEDICATION CHANGE ON MED LIST VERBALIZED UNDERSTANDING

## 2017-05-17 NOTE — Telephone Encounter (Signed)
Less just use the hydralazine as as needed for blood pressures over 768 mmHg systolic. Just 1 tablet as needed for SBP > 180 mmHg.  We will see if he can tolerate that.  I am not overly concerned about blood pressure is 113/51.  The 150/80 is where I would like it to be though low.  Glenetta Hew, MD

## 2017-05-23 ENCOUNTER — Other Ambulatory Visit: Payer: Self-pay | Admitting: *Deleted

## 2017-05-23 ENCOUNTER — Other Ambulatory Visit: Payer: Self-pay | Admitting: Pharmacist Clinician (PhC)/ Clinical Pharmacy Specialist

## 2017-05-23 MED ORDER — WARFARIN SODIUM 5 MG PO TABS: ORAL_TABLET | ORAL | 1 refills | Status: DC

## 2017-05-23 MED ORDER — ISOSORBIDE MONONITRATE ER 30 MG PO TB24: 30.0000 mg | ORAL_TABLET | Freq: Every day | ORAL | 3 refills | Status: DC

## 2017-06-10 ENCOUNTER — Ambulatory Visit (INDEPENDENT_AMBULATORY_CARE_PROVIDER_SITE_OTHER): Payer: Medicare Other | Admitting: Pharmacist

## 2017-06-10 DIAGNOSIS — I480 Paroxysmal atrial fibrillation: Secondary | ICD-10-CM | POA: Diagnosis not present

## 2017-06-10 DIAGNOSIS — I059 Rheumatic mitral valve disease, unspecified: Secondary | ICD-10-CM

## 2017-06-10 DIAGNOSIS — Z952 Presence of prosthetic heart valve: Secondary | ICD-10-CM | POA: Diagnosis not present

## 2017-06-10 LAB — POCT INR: INR: 3.7

## 2017-06-10 NOTE — Patient Instructions (Signed)
Description   No Coumadin today then continue taking 1 tablet every day except 1.5 tablets on Sundays, Wednesdays, and Fridays. Recheck in 3 weeks. Remain consistent with dark green leafy veggies.  Coumadin Clinic # [phone]

## 2017-07-01 ENCOUNTER — Ambulatory Visit (INDEPENDENT_AMBULATORY_CARE_PROVIDER_SITE_OTHER): Payer: Medicare Other | Admitting: *Deleted

## 2017-07-01 DIAGNOSIS — Z952 Presence of prosthetic heart valve: Secondary | ICD-10-CM | POA: Diagnosis not present

## 2017-07-01 DIAGNOSIS — I480 Paroxysmal atrial fibrillation: Secondary | ICD-10-CM | POA: Diagnosis not present

## 2017-07-01 DIAGNOSIS — I059 Rheumatic mitral valve disease, unspecified: Secondary | ICD-10-CM | POA: Diagnosis not present

## 2017-07-01 DIAGNOSIS — Z5181 Encounter for therapeutic drug level monitoring: Secondary | ICD-10-CM | POA: Diagnosis not present

## 2017-07-01 LAB — POCT INR: INR: 3.1

## 2017-07-01 NOTE — Patient Instructions (Signed)
Description   Continue taking 1 tablet every day except 1.5 tablets on Sundays, Wednesdays, and Fridays. Recheck in 4 weeks with MD Appt with Dr. Ellyn Hack. Remain consistent with dark green leafy veggies.  Coumadin Clinic # [phone]

## 2017-07-27 ENCOUNTER — Encounter: Payer: Self-pay | Admitting: Cardiology

## 2017-07-27 ENCOUNTER — Ambulatory Visit (INDEPENDENT_AMBULATORY_CARE_PROVIDER_SITE_OTHER): Payer: Medicare Other | Admitting: Pharmacist Clinician (PhC)/ Clinical Pharmacy Specialist

## 2017-07-27 ENCOUNTER — Ambulatory Visit (INDEPENDENT_AMBULATORY_CARE_PROVIDER_SITE_OTHER): Payer: Medicare Other | Admitting: Cardiology

## 2017-07-27 VITALS — BP 132/60 | HR 84 | Ht 72.0 in | Wt 161.4 lb

## 2017-07-27 DIAGNOSIS — I480 Paroxysmal atrial fibrillation: Secondary | ICD-10-CM

## 2017-07-27 DIAGNOSIS — I059 Rheumatic mitral valve disease, unspecified: Secondary | ICD-10-CM

## 2017-07-27 DIAGNOSIS — Z952 Presence of prosthetic heart valve: Secondary | ICD-10-CM

## 2017-07-27 DIAGNOSIS — I10 Essential (primary) hypertension: Secondary | ICD-10-CM | POA: Diagnosis not present

## 2017-07-27 DIAGNOSIS — R0609 Other forms of dyspnea: Secondary | ICD-10-CM | POA: Diagnosis not present

## 2017-07-27 DIAGNOSIS — I351 Nonrheumatic aortic (valve) insufficiency: Secondary | ICD-10-CM | POA: Diagnosis not present

## 2017-07-27 LAB — POCT INR: INR: 3.4

## 2017-07-27 MED ORDER — HYDRALAZINE HCL 25 MG PO TABS: ORAL_TABLET | ORAL | 3 refills | Status: DC

## 2017-07-27 MED ORDER — FUROSEMIDE 40 MG PO TABS: ORAL_TABLET | ORAL | 3 refills | Status: DC

## 2017-07-27 NOTE — Patient Instructions (Signed)
Dr Ellyn Hack has recommended making the following medication changes: 1. INCREASE Isosorbide to a whole tablet daily 2. INCREASE Furosemide - TAKE an extra 20 mg 3 days a week in addition to the 40 mg daily 3. TAKE Hydralazine - 1 tablet at lunch and continue to take as needed for systolic blood pressure greater than 160 mmHg  Your physician recommends that you schedule a follow-up appointment in 6 months. You will receive a reminder letter in the mail two months in advance. If you don't receive a letter, please call our office to schedule the follow-up appointment.  If you need a refill on your cardiac medications before your next appointment, please call your pharmacy.

## 2017-07-27 NOTE — Patient Instructions (Signed)
Description   Continue taking 1 tablet every day except 1.5 tablets on Sundays, Wednesdays, and Fridays. Recheck in 4 weeks. Remain consistent with dark green leafy veggies.  Coumadin Clinic # [phone]

## 2017-07-27 NOTE — Progress Notes (Signed)
PCP: [name], MD  Clinic Note: Chief Complaint  Patient presents with  . Follow-up    pt reports lack of energy, BP is back to normal, and occas shob "unexpected" at times  . Cardiac Valve Problem    Status post mitral valve replacement with aortic insufficiency.  . Atrial Fibrillation    PAF    HPI: Richard Spears is a 84 y.o. male with a PMH below who presents today for 3-week follow-up for atrial fibrillation history of mitral valve replacement with some shortness of breath He previously saw Dr. Ilda Foil before seeing Dr. Lendell Caprice.  He has history of:  A. fib. Rate controlled on Zebeta, warfarin for anticoagulation. CHADS2VASC=2 (age x 2) Status post mitral valve replacement mechanical. 1997  Venous deficiency, wears support stockings.  Mild orthostatic dizziness.  Moderate AI stable from 2012/ - 2017  Richard Spears was seen on May 12, 2017.  Follow-up evaluation of exertional dyspnea/heart failure.  We intended to his hydralazine dosing to 25 twice daily and as needed -however he did not start taking it.  He was concerned about hypotension.  Recent Hospitalizations: None  Studies Personally Reviewed - (if available, images/films reviewed: From Epic Chart or Care Everywhere)  Transthoracic Echo October 2018: EF 55-60%.  Moderate aortic regurgitation.  Bileaflet Saint Jude mechanical mitral valve with no paravalvular leak.  Severe LA dilation.  Minimally elevated pulmonary pressures.  Interval History: Richard Spears returns today doing fairly well.  He still has a little bit tired and sensation and fatigue some exertional dyspnea usually if the does heavy activity level like pushing a heavy cart in a grocery store.  He feels a fullness in his abdomen is a little bit short of breath.  But usually is not limited in his routine activities.  He brought with him a list of his blood pressures that usually show well-controlled pressures in the morning, but are usually  higher in the 672-094 mmHg systolic range in the afternoons.  He does note that his shortness of breath symptoms are worsened in the afternoon as opposed in the morning.    He has noted some occasional presyncope type dizziness sensations usually positional in nature.  He has not had any PND or orthopnea symptoms.  Occasional end of day edema, but well controlled with current dose of furosemide.Marland Kitchen  No chest tightness or pressure with rest or exertion.  No rapid irregular heartbeats or palpitations just a few skipped beats.  No TIA or amaurosis fugax symptoms.  No claudication.   ROS: A comprehensive was performed. Review of Systems  Constitutional: Positive for malaise/fatigue (Relatively stable to improved).  HENT: Negative for nosebleeds.   Respiratory: Positive for shortness of breath (mostly exertional now.). Negative for cough and hemoptysis.   Cardiovascular: Positive for leg swelling (Controlled with Lasix). Negative for chest pain.  Gastrointestinal: Negative for blood in stool.  Genitourinary: Negative for hematuria and urgency.  Musculoskeletal: Positive for back pain and joint pain. Negative for myalgias.  Neurological: Positive for dizziness (Positional).  Psychiatric/Behavioral: Positive for depression. Negative for memory loss. The patient is nervous/anxious. The patient does not have insomnia.   All other systems reviewed and are negative.   I have reviewed and (if needed) personally updated the patient's problem list, medications, allergies, past medical and surgical history, social and family history.   Past Medical History:  Diagnosis Date  . Anemia    leakoppenia  . BPH (benign prostatic hypertrophy)   . Bullous pemphigoid  [name], March 2011, right forearm squamous cell carcinoma  . Chronic anticoagulation    systemic  . Colon polyp    transverse, 2002  . History of peptic ulcer    remote, 3/95  . Hx of actinic keratosis   . Hx of basal cell carcinoma   .  Hx of squamous cell carcinoma of skin   . Hyperlipidemia   . Left inguinal hernia   . Moderate aortic insufficiency 2009   audible aortic insufficiency on 1/09 echo  . PAF (paroxysmal atrial fibrillation) ([facility]) 01/17/2014   On Warfarin.  . S/P mitral valve replacement with metallic valve 40/1027   INR goal 2.5-3.5, St Jude,   . 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 Electrodesiccation and Curettage and Shave Biopsy Right    Right medial, anterio tibia: Well differentiated Squamous Cell  . hip replacements Left    10 years ago  . MITRAL VALVE REPLACEMENT  03/1996   St. Jude mechanical valve  . TRANSTHORACIC ECHOCARDIOGRAM  01/19/2011   EF 60-65%. No regional wall motion abnormality. Mechanical mitral valve in place. Moderate aortic regurgitation.  . TRANSTHORACIC ECHOCARDIOGRAM  08/'17; 10/'18    a) Mild conc LVH. EF 55-60%. No RWMA. Mod AI. Mechanical MV prosthesis functioning properly. LAD dilation.;; b)  EF 55-60%.  Mo AI.  Bileaflet Saint Jude mechanical MV with no paravalvular leak.  Severe LA dilation.  Minimally elevated PAP    Current Meds  Medication Sig  . bisoprolol (ZEBETA) 5 MG tablet Take 1 tablet (5 mg total) by mouth daily.  Marland Kitchen COUMADIN 5 MG tablet Take 1 to 1 and 1/2 tablets daily as directed by coumadin clinic  . fluticasone (FLONASE) 50 MCG/ACT nasal spray Place 2 sprays into both nostrils as needed for allergies or rhinitis.  . furosemide (LASIX) 40 MG tablet Take 1 tablet (40 mg total) by mouth daily and take an additional 0.5 tablet (20 mg total) by mouth 3 days a week.  . hydrALAZINE (APRESOLINE) 25 MG tablet Take 1 tablet (25 mg total) by mouth daily at lunchtime. Take an additional tablet as needed for systolic blood pressure >253 mmHg  . isosorbide mononitrate (IMDUR) 30 MG 24 hr tablet Take 1 tablet (30 mg total) by mouth daily.  Marland Kitchen ketoconazole (NIZORAL) 2 % shampoo  Apply 1 application topically 2 (two) times a week.  Marland Kitchen LORazepam (ATIVAN) 1 MG tablet Take 1 mg by mouth every 8 (eight) hours as needed for anxiety.   . Multiple Vitamins-Minerals (MULTIVITAMIN PO) Take 1 tablet by mouth daily.  Marland Kitchen warfarin (COUMADIN) 5 MG tablet Take 1 to 1.5 tablets by mouth daily as directed  . [DISCONTINUED] furosemide (LASIX) 40 MG tablet Take 1 tablet (40 mg total) by mouth 2 (two) times daily.  . [DISCONTINUED] hydrALAZINE (APRESOLINE) 25 MG tablet Take 1 tablet (25 mg total) by mouth daily as needed. IF SYSTOLIC BLOOD PRESSURE IS >180,IF NEEDED    Allergies  Allergen Reactions  . Flexeril [Cyclobenzaprine] Diarrhea    Social History   Socioeconomic History  . Marital status: Married    Spouse name: None  . Number of children: 2  . Years of education: None  . Highest education level: None  Social Needs  . Financial resource strain: None  . Food insecurity - worry: None  . Food insecurity - inability: None  . Transportation needs - medical: None  .  Transportation needs - non-medical: None  Occupational History  . Occupation: retired  Tobacco Use  . Smoking status: Former Smoker    Packs/day: 1.00    Years: 8.00    Pack years: 8.00    Types: Cigarettes    Last attempt to quit: 10/31/1954    Years since quitting: 62.7  . Smokeless tobacco: Never Used  Substance and Sexual Activity  . Alcohol use: Yes    Alcohol/week: 0.0 oz    Comment: 1-2 drinks per day  . Drug use: None  . Sexual activity: None  Other Topics Concern  . None  Social History Narrative   Patient lives at [facility], With his wife.    family history includes COPD in his brother; Cancer in his brother; Hypertension in his mother; Lung cancer in his sister; Lupus in his son; Other in his sister.  Wt Readings from Last 3 Encounters:  07/27/17 161 lb 6.4 oz (73.2 kg)  05/12/17 159 lb (72.1 kg)  04/19/17 156 lb 12.8 oz (71.1 kg)  - Eating more.     PHYSICAL EXAM BP  132/60   Pulse 84   Ht 6' (1.829 m)   Wt 161 lb 6.4 oz (73.2 kg)   SpO2 97%   BMI 21.89 kg/m  Physical Exam  Constitutional: He is oriented to person, place, and time. He appears well-developed and well-nourished. No distress.  Appears younger than his stated age  HENT:  Head: Normocephalic and atraumatic.  Mouth/Throat: No oropharyngeal exudate.  Neck: Neck supple. No hepatojugular reflux and no JVD present. Carotid bruit is not present.  Cardiovascular: Normal rate, regular rhythm and intact distal pulses.  Occasional extrasystoles are present. PMI is not displaced. Exam reveals no gallop.  Murmur heard.  Low-pitched rumbling holosystolic murmur is present with a grade of 1/6 at the upper left sternal border and lower left sternal border.  Low-pitched holodiastolic murmur is present with a grade of 1/6 at the upper right sternal border. Crisp metallic S1  Pulmonary/Chest: Effort normal and breath sounds normal. No respiratory distress. He has no wheezes. He has no rales.  Abdominal: Soft. Bowel sounds are normal. He exhibits no distension. There is no tenderness. There is no rebound.  Musculoskeletal: Normal range of motion. He exhibits no edema.  Neurological: He is alert and oriented to person, place, and time.  Skin: Skin is warm and dry.  Psychiatric: He has a normal mood and affect. His behavior is normal. Judgment and thought content normal.  Nursing note and vitals reviewed.   Adult ECG Report N/A  Other studies Reviewed: Additional studies/ records that were reviewed today include:  Recent Labs:   Lab Results  Component Value Date   CREATININE 1.17 01/17/2007   BUN 16 01/17/2007   NA 137 01/17/2007   K 4.4 01/17/2007   CL 100 01/17/2007   CO2 29 01/17/2007   No results found for: CHOL, HDL, LDLCALC, LDLDIRECT, TRIG, CHOLHDL  ASSESSMENT / PLAN:  Exertional dyspnea seems to be stable now.  Unfortunately still has elevated blood pressures in the evenings.  He did  not do the as needed as instructed. Plan for now is for him to take 25 mg hydralazine as a standing dose at lunchtime and then may take an additional dose for systolic blood pressure greater than 160 mmHg.  Hopefully this will allow for some additional afterload reduction and help improve his diastolic heart failure symptoms.  He will continue isosorbide and bisoprolol at current dose. For  now continue on stable dose of Lasix -once daily, and then 3 days a week take additional dose.  We will reassess blood pressure control and a month or so.  Problem List Items Addressed This Visit    AI (aortic insufficiency) (Chronic)    Consistent moderate regurgitation.  Probably okay to wait another 2 years before and reassess, unless symptoms warrant.   Barely heard on exam.  Need additional blood pressure control.        Relevant Medications   hydrALAZINE (APRESOLINE) 25 MG tablet   furosemide (LASIX) 40 MG tablet   Essential hypertension (Chronic)    Plan: He had not been taking hydralazine as recommended.  We will have him take 1 dose of hydralazine daily at roughly lunchtime to allow for additional blood pressure coverage in the afternoon.  He will continue taking bisoprolol as directed.      Relevant Medications   hydrALAZINE (APRESOLINE) 25 MG tablet   furosemide (LASIX) 40 MG tablet   Exertional dyspnea - Primary    Most likely related to some diastolic dysfunction with aortic insufficiency.  For now continue blood pressure control. -->  Adding standing dose of hydralazine along with nitrate for afterload reduction to avoid excess volume overload with aortic insufficiency, will; Have him take additional dose of Lasix as a standing dose 3 times a week.  Then use additional doses for worsening swelling as needed.       Mitral valve disorder (Chronic)    Mechanical mitral valve replacement.  Remains on warfarin.  Monitored and stable.      Relevant Medications   hydrALAZINE (APRESOLINE)  25 MG tablet   furosemide (LASIX) 40 MG tablet   Paroxysmal atrial fibrillation (HCC) (Chronic)    Unclear if he actually is in A. fib today or not.  Sounded more regular today.  He has no recollection of whether he is or is not in A. fib.  Continue rate control and avoid rhythm control agents.   CHA2DS2-VASc Score and unadjusted Ischemic Stroke Rate (% per year) is equal to 2.2 % stroke rate/year from a score of 2  He is on warfarin for anticoagulation based on his mechanical mitral valve regardless.      Relevant Medications   hydrALAZINE (APRESOLINE) 25 MG tablet   furosemide (LASIX) 40 MG tablet   S/P MVR (mitral valve replacement) (Chronic)    Well-seated mechanical mitral valve with no paravalvular leak on echo.  Remains stable.  We are following up as be monitor aortic insufficiency.         Patient had multiple questions and we had a prolonged discussion.  Close to 30 minutes was spent in direct patient contact.  Greater than 75% of the time was spent in direct patient counseling and explanation.  Current medicines are reviewed at length with the patient today. (+/- concerns) BP is still elevated. The following changes have been made: = add Hydralazine for episodes or Ortho Hypotension.  Patient Instructions  Dr Ellyn Hack has recommended making the following medication changes: 1. INCREASE Isosorbide to a whole tablet daily 2. INCREASE Furosemide - TAKE an extra 20 mg 3 days a week in addition to the 40 mg daily 3. TAKE Hydralazine - 1 tablet at lunch and continue to take as needed for systolic blood pressure greater than 160 mmHg  Your physician recommends that you schedule a follow-up appointment in 6 months. You will receive a reminder letter in the mail two months in advance. If you don't receive  a letter, please call our office to schedule the follow-up appointment.  If you need a refill on your cardiac medications before your next appointment, please call your  pharmacy.   Studies Ordered:   No orders of the defined types were placed in this encounter.     Glenetta Hew, M.D., M.S. Interventional Cardiologist   Pager # [phone] Phone # [phone] [address] [place], [place] [address]

## 2017-07-30 ENCOUNTER — Encounter: Payer: Self-pay | Admitting: Cardiology

## 2017-07-30 NOTE — Assessment & Plan Note (Addendum)
Consistent moderate regurgitation.  Probably okay to wait another 2 years before and reassess, unless symptoms warrant.   Barely heard on exam.  Need additional blood pressure control.

## 2017-07-30 NOTE — Assessment & Plan Note (Addendum)
Most likely related to some diastolic dysfunction with aortic insufficiency.  For now continue blood pressure control. -->  Adding standing dose of hydralazine along with nitrate for afterload reduction to avoid excess volume overload with aortic insufficiency, will; Have him take additional dose of Lasix as a standing dose 3 times a week.  Then use additional doses for worsening swelling as needed.

## 2017-07-30 NOTE — Assessment & Plan Note (Signed)
Well-seated mechanical mitral valve with no paravalvular leak on echo.  Remains stable.  We are following up as be monitor aortic insufficiency.

## 2017-07-30 NOTE — Assessment & Plan Note (Signed)
Plan: He had not been taking hydralazine as recommended.  We will have him take 1 dose of hydralazine daily at roughly lunchtime to allow for additional blood pressure coverage in the afternoon.  He will continue taking bisoprolol as directed.

## 2017-07-30 NOTE — Assessment & Plan Note (Signed)
Mechanical mitral valve replacement.  Remains on warfarin.  Monitored and stable.

## 2017-07-30 NOTE — Assessment & Plan Note (Addendum)
Unclear if he actually is in A. fib today or not.  Sounded more regular today.  He has no recollection of whether he is or is not in A. fib.  Continue rate control and avoid rhythm control agents.   CHA2DS2-VASc Score and unadjusted Ischemic Stroke Rate (% per year) is equal to 2.2 % stroke rate/year from a score of 2  He is on warfarin for anticoagulation based on his mechanical mitral valve regardless.

## 2017-08-02 ENCOUNTER — Telehealth: Payer: Self-pay | Admitting: Cardiology

## 2017-08-02 NOTE — Telephone Encounter (Signed)
I thought that I explained this to him -- his BP has been up in the 150s at night -- that is why I added the Lunchtime Hydralazine.  I would ask him to check BP in the evening.    Do NOT fret about it.  I did this b/c he was worried about PM pressures.  Glenetta Hew, MD

## 2017-08-02 NOTE — Telephone Encounter (Signed)
Returned call to patient of Dr. Ellyn Hack. He was in office on Jan 30. Hydralazine 25mg  was added at lunch and Imdur was increased to 30mg  daily. He reports low BP since med changes. He does not take any BP medications in the AM. He has had isolated dizziness but this was when he was active, doing stuff. No systolic reading has been under 100. Explained reason meds were changed per MD note. Explained that he should take hydralazine (and other meds) as prescribed since he is not have systolic readings under 950 and is not symptomatic. Informed him would notify MD for any recommendations and suggested he check his BP before he takes hydralazine today so he will know what his pressure is before (and ideally start checking after, if he is concerned.)  2/1 110/70 (3pm) 120/76 (4pm)  2/2 121/64 (10am)  2/3 121/63 (10am)  2/4 120/70, 102/52, 116/62, 105/53  2/5 113/59

## 2017-08-02 NOTE — Telephone Encounter (Signed)
New message     Patient calling with concerns that BP is too low.  Pt c/o BP issue: STAT if pt c/o blurred vision, one-sided weakness or slurred speech  1. What are your last 5 BP readings? 113/59, 120/70, 102/52, 116/62, 105/53  2. Are you having any other symptoms (ex. Dizziness, headache, blurred vision, passed out)? dizziness  3. What is your BP issue? Feels BP too low

## 2017-08-03 NOTE — Telephone Encounter (Signed)
Returned the call to the patient and made him aware of Dr. Allison Quarry recommendation. He will take the Hydralazine this afternoon, check his blood pressure tonight and start keeping a record of this. He will call back with anymore concerns.

## 2017-08-03 NOTE — Telephone Encounter (Signed)
Pt wants to know what to do about his blood pressure

## 2017-08-04 ENCOUNTER — Telehealth: Payer: Self-pay | Admitting: Cardiology

## 2017-08-04 DIAGNOSIS — Z1389 Encounter for screening for other disorder: Secondary | ICD-10-CM | POA: Diagnosis not present

## 2017-08-04 DIAGNOSIS — I480 Paroxysmal atrial fibrillation: Secondary | ICD-10-CM | POA: Diagnosis not present

## 2017-08-04 DIAGNOSIS — I10 Essential (primary) hypertension: Secondary | ICD-10-CM | POA: Diagnosis not present

## 2017-08-04 DIAGNOSIS — D72819 Decreased white blood cell count, unspecified: Secondary | ICD-10-CM | POA: Diagnosis not present

## 2017-08-04 DIAGNOSIS — I872 Venous insufficiency (chronic) (peripheral): Secondary | ICD-10-CM | POA: Diagnosis not present

## 2017-08-04 DIAGNOSIS — N183 Chronic kidney disease, stage 3 (moderate): Secondary | ICD-10-CM | POA: Diagnosis not present

## 2017-08-04 DIAGNOSIS — Z0000 Encounter for general adult medical examination without abnormal findings: Secondary | ICD-10-CM | POA: Diagnosis not present

## 2017-08-04 NOTE — Telephone Encounter (Signed)
Will route to Dr. Ellyn Hack and his nurse.

## 2017-08-04 NOTE — Telephone Encounter (Signed)
New message    Patient calling to report BP per Dr Ellyn Hack request.   Pt c/o BP issue: STAT if pt c/o blurred vision, one-sided weakness or slurred speech  1. What are your last 5 BP readings? 133/68, [id number] today  2. Are you having any other symptoms (ex. Dizziness, headache, blurred vision, passed out)? NO  3. What is your BP issue? Reporting BP

## 2017-08-09 NOTE — Telephone Encounter (Signed)
SPOKE TO PATIENT , INFORMED TO CONTINUE WITH CURRENT MEDICATIONS  PER DR HARDING.

## 2017-08-09 NOTE — Telephone Encounter (Signed)
Blood pressures look good.  Glenetta Hew, MD

## 2017-08-26 ENCOUNTER — Ambulatory Visit (INDEPENDENT_AMBULATORY_CARE_PROVIDER_SITE_OTHER): Payer: Medicare Other | Admitting: *Deleted

## 2017-08-26 DIAGNOSIS — I480 Paroxysmal atrial fibrillation: Secondary | ICD-10-CM

## 2017-08-26 DIAGNOSIS — I059 Rheumatic mitral valve disease, unspecified: Secondary | ICD-10-CM

## 2017-08-26 DIAGNOSIS — Z952 Presence of prosthetic heart valve: Secondary | ICD-10-CM | POA: Diagnosis not present

## 2017-08-26 DIAGNOSIS — Z5181 Encounter for therapeutic drug level monitoring: Secondary | ICD-10-CM | POA: Diagnosis not present

## 2017-08-26 LAB — POCT INR: INR: 3

## 2017-08-26 NOTE — Patient Instructions (Signed)
Description   Continue same dose of Warfarin  1 tablet every day except 1.5 tablets on Sundays, Wednesdays, and Fridays. Recheck in 4 weeks. Remain consistent with dark green leafy veggies.  Coumadin Clinic # [phone]

## 2017-09-23 ENCOUNTER — Ambulatory Visit (INDEPENDENT_AMBULATORY_CARE_PROVIDER_SITE_OTHER): Payer: Medicare Other | Admitting: *Deleted

## 2017-09-23 DIAGNOSIS — Z952 Presence of prosthetic heart valve: Secondary | ICD-10-CM | POA: Diagnosis not present

## 2017-09-23 DIAGNOSIS — I059 Rheumatic mitral valve disease, unspecified: Secondary | ICD-10-CM

## 2017-09-23 DIAGNOSIS — Z5181 Encounter for therapeutic drug level monitoring: Secondary | ICD-10-CM | POA: Diagnosis not present

## 2017-09-23 DIAGNOSIS — I480 Paroxysmal atrial fibrillation: Secondary | ICD-10-CM

## 2017-09-23 LAB — POCT INR: INR: 3.2

## 2017-09-23 NOTE — Patient Instructions (Signed)
Description   Continue same dose of Warfarin  1 tablet every day except 1.5 tablets on Sundays, Wednesdays, and Fridays. Recheck in 5 weeks. Remain consistent with dark green leafy veggies.  Coumadin Clinic # [phone]

## 2017-10-06 ENCOUNTER — Other Ambulatory Visit: Payer: Self-pay | Admitting: Cardiology

## 2017-10-09 ENCOUNTER — Inpatient Hospital Stay (HOSPITAL_COMMUNITY)
Admission: EM | Admit: 2017-10-09 | Discharge: 2017-10-14 | DRG: 470 | Disposition: A | Discharge status: Skilled Nursing Facility | Payer: Medicare Other | Attending: Family Medicine | Admitting: Family Medicine

## 2017-10-09 ENCOUNTER — Emergency Department (HOSPITAL_COMMUNITY): Payer: Medicare Other

## 2017-10-09 ENCOUNTER — Other Ambulatory Visit: Payer: Self-pay

## 2017-10-09 ENCOUNTER — Encounter (HOSPITAL_COMMUNITY): Payer: Self-pay | Admitting: *Deleted

## 2017-10-09 DIAGNOSIS — F419 Anxiety disorder, unspecified: Secondary | ICD-10-CM | POA: Diagnosis present

## 2017-10-09 DIAGNOSIS — M25551 Pain in right hip: Secondary | ICD-10-CM | POA: Diagnosis not present

## 2017-10-09 DIAGNOSIS — Z87891 Personal history of nicotine dependence: Secondary | ICD-10-CM | POA: Diagnosis not present

## 2017-10-09 DIAGNOSIS — D61818 Other pancytopenia: Secondary | ICD-10-CM | POA: Diagnosis present

## 2017-10-09 DIAGNOSIS — Z96642 Presence of left artificial hip joint: Secondary | ICD-10-CM | POA: Diagnosis present

## 2017-10-09 DIAGNOSIS — Z7901 Long term (current) use of anticoagulants: Secondary | ICD-10-CM | POA: Diagnosis not present

## 2017-10-09 DIAGNOSIS — S72011A Unspecified intracapsular fracture of right femur, initial encounter for closed fracture: Principal | ICD-10-CM | POA: Diagnosis present

## 2017-10-09 DIAGNOSIS — I4811 Longstanding persistent atrial fibrillation: Secondary | ICD-10-CM | POA: Diagnosis present

## 2017-10-09 DIAGNOSIS — Z96649 Presence of unspecified artificial hip joint: Secondary | ICD-10-CM

## 2017-10-09 DIAGNOSIS — Z952 Presence of prosthetic heart valve: Secondary | ICD-10-CM

## 2017-10-09 DIAGNOSIS — I480 Paroxysmal atrial fibrillation: Secondary | ICD-10-CM | POA: Diagnosis not present

## 2017-10-09 DIAGNOSIS — S72001D Fracture of unspecified part of neck of right femur, subsequent encounter for closed fracture with routine healing: Secondary | ICD-10-CM | POA: Diagnosis not present

## 2017-10-09 DIAGNOSIS — S79911A Unspecified injury of right hip, initial encounter: Secondary | ICD-10-CM | POA: Diagnosis not present

## 2017-10-09 DIAGNOSIS — N400 Enlarged prostate without lower urinary tract symptoms: Secondary | ICD-10-CM | POA: Diagnosis present

## 2017-10-09 DIAGNOSIS — S72001A Fracture of unspecified part of neck of right femur, initial encounter for closed fracture: Secondary | ICD-10-CM

## 2017-10-09 DIAGNOSIS — Z888 Allergy status to other drugs, medicaments and biological substances status: Secondary | ICD-10-CM | POA: Diagnosis not present

## 2017-10-09 DIAGNOSIS — M1611 Unilateral primary osteoarthritis, right hip: Secondary | ICD-10-CM | POA: Diagnosis present

## 2017-10-09 DIAGNOSIS — S72009A Fracture of unspecified part of neck of unspecified femur, initial encounter for closed fracture: Secondary | ICD-10-CM

## 2017-10-09 DIAGNOSIS — M6281 Muscle weakness (generalized): Secondary | ICD-10-CM | POA: Diagnosis not present

## 2017-10-09 DIAGNOSIS — I351 Nonrheumatic aortic (valve) insufficiency: Secondary | ICD-10-CM | POA: Diagnosis present

## 2017-10-09 DIAGNOSIS — Y9301 Activity, walking, marching and hiking: Secondary | ICD-10-CM | POA: Diagnosis present

## 2017-10-09 DIAGNOSIS — Z79899 Other long term (current) drug therapy: Secondary | ICD-10-CM | POA: Diagnosis not present

## 2017-10-09 DIAGNOSIS — I10 Essential (primary) hypertension: Secondary | ICD-10-CM | POA: Diagnosis present

## 2017-10-09 DIAGNOSIS — S72091A Other fracture of head and neck of right femur, initial encounter for closed fracture: Secondary | ICD-10-CM | POA: Diagnosis not present

## 2017-10-09 DIAGNOSIS — T148XXA Other injury of unspecified body region, initial encounter: Secondary | ICD-10-CM | POA: Diagnosis not present

## 2017-10-09 DIAGNOSIS — W010XXA Fall on same level from slipping, tripping and stumbling without subsequent striking against object, initial encounter: Secondary | ICD-10-CM | POA: Diagnosis present

## 2017-10-09 DIAGNOSIS — S299XXA Unspecified injury of thorax, initial encounter: Secondary | ICD-10-CM | POA: Diagnosis not present

## 2017-10-09 DIAGNOSIS — R9431 Abnormal electrocardiogram [ECG] [EKG]: Secondary | ICD-10-CM | POA: Diagnosis not present

## 2017-10-09 DIAGNOSIS — W19XXXA Unspecified fall, initial encounter: Secondary | ICD-10-CM

## 2017-10-09 DIAGNOSIS — R2681 Unsteadiness on feet: Secondary | ICD-10-CM | POA: Diagnosis not present

## 2017-10-09 DIAGNOSIS — E785 Hyperlipidemia, unspecified: Secondary | ICD-10-CM | POA: Diagnosis not present

## 2017-10-09 DIAGNOSIS — Z471 Aftercare following joint replacement surgery: Secondary | ICD-10-CM | POA: Diagnosis not present

## 2017-10-09 DIAGNOSIS — R29898 Other symptoms and signs involving the musculoskeletal system: Secondary | ICD-10-CM | POA: Diagnosis not present

## 2017-10-09 DIAGNOSIS — G8911 Acute pain due to trauma: Secondary | ICD-10-CM | POA: Diagnosis not present

## 2017-10-09 DIAGNOSIS — Z96641 Presence of right artificial hip joint: Secondary | ICD-10-CM | POA: Diagnosis not present

## 2017-10-09 DIAGNOSIS — R079 Chest pain, unspecified: Secondary | ICD-10-CM | POA: Diagnosis not present

## 2017-10-09 DIAGNOSIS — S72041A Displaced fracture of base of neck of right femur, initial encounter for closed fracture: Secondary | ICD-10-CM | POA: Diagnosis not present

## 2017-10-09 LAB — COMPREHENSIVE METABOLIC PANEL
ALK PHOS: 59 U/L (ref 38–126)
ALT: 21 U/L (ref 17–63)
AST: 39 U/L (ref 15–41)
Albumin: 4.2 g/dL (ref 3.5–5.0)
Anion gap: 13 (ref 5–15)
BUN: 26 mg/dL — AB (ref 6–20)
CALCIUM: 9 mg/dL (ref 8.9–10.3)
CO2: 23 mmol/L (ref 22–32)
Chloride: 99 mmol/L — ABNORMAL LOW (ref 101–111)
Creatinine, Ser: 1.26 mg/dL — ABNORMAL HIGH (ref 0.61–1.24)
GFR calc Af Amer: 56 mL/min — ABNORMAL LOW (ref >60)
GFR calc non Af Amer: 48 mL/min — ABNORMAL LOW (ref >60)
GLUCOSE: 111 mg/dL — AB (ref 65–99)
Potassium: 4.5 mmol/L (ref 3.5–5.1)
Sodium: 135 mmol/L (ref 135–145)
TOTAL PROTEIN: 7.1 g/dL (ref 6.5–8.1)
Total Bilirubin: 1.3 mg/dL — ABNORMAL HIGH (ref 0.3–1.2)

## 2017-10-09 LAB — CBC WITH DIFFERENTIAL/PLATELET
BASOS ABS: 0 10*3/uL (ref 0.0–0.1)
Basophils Relative: 0 %
EOS PCT: 1 %
Eosinophils Absolute: 0 10*3/uL (ref 0.0–0.7)
HCT: 36.6 % — ABNORMAL LOW (ref 39.0–52.0)
Hemoglobin: 12.5 g/dL — ABNORMAL LOW (ref 13.0–17.0)
LYMPHS ABS: 0.4 10*3/uL — AB (ref 0.7–4.0)
Lymphocytes Relative: 10 %
MCH: 33.6 pg (ref 26.0–34.0)
MCHC: 34.2 g/dL (ref 30.0–36.0)
MCV: 98.4 fL (ref 78.0–100.0)
MONO ABS: 0.3 10*3/uL (ref 0.1–1.0)
Monocytes Relative: 7 %
Neutro Abs: 3 10*3/uL (ref 1.7–7.7)
Neutrophils Relative %: 82 %
PLATELETS: 134 10*3/uL — AB (ref 150–400)
RBC: 3.72 MIL/uL — ABNORMAL LOW (ref 4.22–5.81)
RDW: 14.4 % (ref 11.5–15.5)
WBC: 3.7 10*3/uL — ABNORMAL LOW (ref 4.0–10.5)

## 2017-10-09 LAB — TYPE AND SCREEN
ABO/RH(D): O POS
Antibody Screen: NEGATIVE

## 2017-10-09 LAB — PROTIME-INR
INR: 2.39
Prothrombin Time: 25.8 seconds — ABNORMAL HIGH (ref 11.4–15.2)

## 2017-10-09 LAB — SURGICAL PCR SCREEN
MRSA, PCR: NEGATIVE
STAPHYLOCOCCUS AUREUS: NEGATIVE

## 2017-10-09 LAB — ABO/RH: ABO/RH(D): O POS

## 2017-10-09 LAB — MAGNESIUM: Magnesium: 2.3 mg/dL (ref 1.7–2.4)

## 2017-10-09 MED ORDER — ISOSORBIDE MONONITRATE ER 30 MG PO TB24
30.0000 mg | ORAL_TABLET | Freq: Every day | ORAL | Status: DC
  Administered 2017-10-09 – 2017-10-14 (×6): 30 mg via ORAL
  Filled 2017-10-09 (×6): qty 1

## 2017-10-09 MED ORDER — OXYCODONE HCL 5 MG PO TABS
5.0000 mg | ORAL_TABLET | Freq: Four times a day (QID) | ORAL | Status: DC | PRN
  Administered 2017-10-09 – 2017-10-11 (×4): 10 mg via ORAL
  Filled 2017-10-09 (×4): qty 2

## 2017-10-09 MED ORDER — LACTATED RINGERS IV SOLN: INTRAVENOUS | Status: DC

## 2017-10-09 MED ORDER — MORPHINE SULFATE (PF) 4 MG/ML IV SOLN
INTRAVENOUS | Status: AC
  Filled 2017-10-09: qty 1

## 2017-10-09 MED ORDER — LORAZEPAM 1 MG PO TABS
1.0000 mg | ORAL_TABLET | Freq: Three times a day (TID) | ORAL | Status: DC | PRN
Start: 2017-10-09 — End: 2017-10-14
  Administered 2017-10-10 – 2017-10-11 (×2): 1 mg via ORAL
  Filled 2017-10-09 (×2): qty 1

## 2017-10-09 MED ORDER — HYDROCODONE-ACETAMINOPHEN 5-325 MG PO TABS: 1.0000 | ORAL_TABLET | Freq: Four times a day (QID) | ORAL | Status: DC | PRN

## 2017-10-09 MED ORDER — MORPHINE SULFATE (PF) 4 MG/ML IV SOLN
1.0000 mg | INTRAVENOUS | Status: DC | PRN
Start: 2017-10-09 — End: 2017-10-13
  Administered 2017-10-09 – 2017-10-10 (×3): 1 mg via INTRAVENOUS
  Filled 2017-10-09 (×3): qty 1

## 2017-10-09 MED ORDER — PHYTONADIONE 5 MG PO TABS
2.5000 mg | ORAL_TABLET | Freq: Once | ORAL | Status: AC
  Administered 2017-10-09: 2.5 mg via ORAL
  Filled 2017-10-09: qty 1

## 2017-10-09 MED ORDER — SENNA 8.6 MG PO TABS
1.0000 | ORAL_TABLET | Freq: Two times a day (BID) | ORAL | Status: DC
  Administered 2017-10-09 – 2017-10-14 (×10): 8.6 mg via ORAL
  Filled 2017-10-09 (×10): qty 1

## 2017-10-09 MED ORDER — HEPARIN (PORCINE) IN NACL 100-0.45 UNIT/ML-% IJ SOLN
1100.0000 [IU]/h | INTRAMUSCULAR | Status: DC
  Administered 2017-10-09: 1000 [IU]/h via INTRAVENOUS
  Administered 2017-10-10: 1100 [IU]/h via INTRAVENOUS
  Filled 2017-10-09 (×3): qty 250

## 2017-10-09 MED ORDER — POLYETHYLENE GLYCOL 3350 17 G PO PACK
17.0000 g | PACK | Freq: Two times a day (BID) | ORAL | Status: DC
  Administered 2017-10-09 – 2017-10-14 (×8): 17 g via ORAL
  Filled 2017-10-09 (×10): qty 1

## 2017-10-09 MED ORDER — HYDRALAZINE HCL 25 MG PO TABS
25.0000 mg | ORAL_TABLET | Freq: Every day | ORAL | Status: DC
  Administered 2017-10-10 – 2017-10-14 (×3): 25 mg via ORAL
  Filled 2017-10-09 (×4): qty 1

## 2017-10-09 MED ORDER — FENTANYL CITRATE (PF) 100 MCG/2ML IJ SOLN
50.0000 ug | INTRAMUSCULAR | Status: DC | PRN
  Administered 2017-10-09: 50 ug via INTRAVENOUS
  Filled 2017-10-09: qty 2

## 2017-10-09 MED ORDER — BISOPROLOL FUMARATE 5 MG PO TABS
5.0000 mg | ORAL_TABLET | Freq: Every day | ORAL | Status: DC
  Administered 2017-10-09 – 2017-10-14 (×6): 5 mg via ORAL
  Filled 2017-10-09 (×6): qty 1

## 2017-10-09 MED ORDER — MUPIROCIN 2 % EX OINT
1.0000 "application " | TOPICAL_OINTMENT | Freq: Two times a day (BID) | CUTANEOUS | Status: DC
  Administered 2017-10-09 – 2017-10-10 (×2): 1 via NASAL
  Filled 2017-10-09: qty 22

## 2017-10-09 MED ORDER — ACETAMINOPHEN 500 MG PO TABS
1000.0000 mg | ORAL_TABLET | Freq: Three times a day (TID) | ORAL | Status: DC
  Administered 2017-10-09 – 2017-10-12 (×8): 1000 mg via ORAL
  Filled 2017-10-09 (×8): qty 2

## 2017-10-09 MED ORDER — ONDANSETRON HCL 4 MG/2ML IJ SOLN
4.0000 mg | Freq: Once | INTRAMUSCULAR | Status: AC
Start: 2017-10-09 — End: 2017-10-09
  Administered 2017-10-09: 4 mg via INTRAVENOUS
  Filled 2017-10-09: qty 2

## 2017-10-09 NOTE — Consult Note (Signed)
Reason for Consult:Right Hip Fracture Referring Physician: DR.Kobey Richard Spears is an 84 y.o. male.  HPI: Patient from [place] was walking outside and slipped on the Mud and sustained a Closed,Displaced Right Femoral neck Fracture.  Past Medical History:  Diagnosis Date  . Anemia    leakoppenia  . BPH (benign prostatic hypertrophy)   . Bullous pemphigoid    [name], March 2011, right forearm squamous cell carcinoma  . Chronic anticoagulation    systemic  . Colon polyp    transverse, 2002  . History of peptic ulcer    remote, 3/95  . Hx of actinic keratosis   . Hx of basal cell carcinoma   . Hx of squamous cell carcinoma of skin   . Hyperlipidemia   . Left inguinal hernia   . Moderate aortic insufficiency 2009   audible aortic insufficiency on 1/09 echo  . PAF (paroxysmal atrial fibrillation) ([place]) 01/17/2014   On Warfarin.  . S/P mitral valve replacement with metallic valve 24/4010   INR goal 2.5-3.5, St Jude,   . 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 Electrodesiccation and Curettage and Shave Biopsy Right    Right medial, anterio tibia: Well differentiated Squamous Cell  . hip replacements Left    10 years ago  . MITRAL VALVE REPLACEMENT  03/1996   St. Jude mechanical valve  . TRANSTHORACIC ECHOCARDIOGRAM  01/19/2011   EF 60-65%. No regional wall motion abnormality. Mechanical mitral valve in place. Moderate aortic regurgitation.  . TRANSTHORACIC ECHOCARDIOGRAM  08/'17; 10/'18    a) Mild conc LVH. EF 55-60%. No RWMA. Mod AI. Mechanical MV prosthesis functioning properly. LAD dilation.;; b)  EF 55-60%.  Mo AI.  Bileaflet Saint Jude mechanical MV with no paravalvular leak.  Severe LA dilation.  Minimally elevated PAP    Family History  Problem Relation Age of Onset  . Hypertension Mother   . Lung cancer Sister   . COPD Brother   . Cancer Brother   . Other  Sister        polio  . Lupus Son     Social History:  reports that he quit smoking about 62 years ago. His smoking use included cigarettes. He has a 8.00 pack-year smoking history. He has never used smokeless tobacco. He reports that he drinks alcohol. He reports that he does not use drugs.  Allergies:  Allergies  Allergen Reactions  . Flexeril [Cyclobenzaprine] Diarrhea    Medications: I have reviewed the patient's current medications.  Results for orders placed or performed during the hospital encounter of 10/09/17 (from the past 48 hour(s))  Type and screen [place]     Status: None   Collection Time: 10/09/17  3:00 PM  Result Value Ref Range   ABO/RH(D) O POS    Antibody Screen NEG    Sample Expiration      10/12/2017 Performed at [facility], [place] [address], [place], [place] [address]   CBC WITH DIFFERENTIAL     Status: Abnormal   Collection Time: 10/09/17  3:01 PM  Result Value Ref Range   WBC 3.7 (L) 4.0 - 10.5 K/uL   RBC 3.72 (L) 4.22 - 5.81 MIL/uL   Hemoglobin 12.5 (L) 13.0 - 17.0 g/dL   HCT 36.6 (L) 39.0 - 52.0 %   MCV 98.4 78.0 - 100.0 fL   MCH  33.6 26.0 - 34.0 pg   MCHC 34.2 30.0 - 36.0 g/dL   RDW 14.4 11.5 - 15.5 %   Platelets 134 (L) 150 - 400 K/uL   Neutrophils Relative % 82 %   Neutro Abs 3.0 1.7 - 7.7 K/uL   Lymphocytes Relative 10 %   Lymphs Abs 0.4 (L) 0.7 - 4.0 K/uL   Monocytes Relative 7 %   Monocytes Absolute 0.3 0.1 - 1.0 K/uL   Eosinophils Relative 1 %   Eosinophils Absolute 0.0 0.0 - 0.7 K/uL   Basophils Relative 0 %   Basophils Absolute 0.0 0.0 - 0.1 K/uL    Comment: Performed at [facility], [place] [address], [place], [place] [address]  Protime-INR     Status: Abnormal   Collection Time: 10/09/17  3:01 PM  Result Value Ref Range   Prothrombin Time 25.8 (H) 11.4 - 15.2 seconds   INR 2.39     Comment: Performed at [facility], [place] [address] [place],  [place] [address]  Comprehensive metabolic panel     Status: Abnormal   Collection Time: 10/09/17  3:01 PM  Result Value Ref Range   Sodium 135 135 - 145 mmol/L   Potassium 4.5 3.5 - 5.1 mmol/L   Chloride 99 (L) 101 - 111 mmol/L   CO2 23 22 - 32 mmol/L   Glucose, Bld 111 (H) 65 - 99 mg/dL   BUN 26 (H) 6 - 20 mg/dL   Creatinine, Ser 1.26 (H) 0.61 - 1.24 mg/dL   Calcium 9.0 8.9 - 10.3 mg/dL   Total Protein 7.1 6.5 - 8.1 g/dL   Albumin 4.2 3.5 - 5.0 g/dL   AST 39 15 - 41 U/L   ALT 21 17 - 63 U/L   Alkaline Phosphatase 59 38 - 126 U/L   Total Bilirubin 1.3 (H) 0.3 - 1.2 mg/dL   GFR calc non Af Amer 48 (L) >60 mL/min   GFR calc Af Amer 56 (L) >60 mL/min    Comment: (NOTE) The eGFR has been calculated using the CKD EPI equation. This calculation has not been validated in all clinical situations. eGFR's persistently <60 mL/min signify possible Chronic Kidney Disease.    Anion gap 13 5 - 15    Comment: Performed at [facility], [place] [address], [place],  [address]    Dg Chest 1 View  Result Date: 10/09/2017 CLINICAL DATA:  Pain following fall EXAM: CHEST  1 VIEW COMPARISON:  April 22, 2010 FINDINGS: There is no appreciable edema or consolidation. Heart is upper normal in size with pulmonary vascularity within normal limits. Patient is status post mitral valve replacement. There is aortic atherosclerosis. No adenopathy. No bone lesions. No pneumothorax. IMPRESSION: No edema or consolidation. No pneumothorax. Heart upper normal in size. Status post mitral valve replacement. Aortic atherosclerosis present. Aortic Atherosclerosis (ICD10-I70.0). Electronically Signed   By: Lowella Grip III M.D.   On: 10/09/2017 15:12   Dg Hip Unilat With Pelvis 2-3 Views Right  Result Date: 10/09/2017 CLINICAL DATA:  Pain following fall EXAM: DG HIP (WITH OR WITHOUT PELVIS) 2-3V RIGHT COMPARISON:  None. FINDINGS: Frontal pelvis as well as frontal and lateral right hip images were  obtained. There is a subcapital femoral neck fracture on the right with varus angulation at the fracture site. There is a total hip replacement left with prosthetic components well-seated. No other fracture. No dislocation. There is moderate narrowing of the right hip joint. IMPRESSION: Subcapital femoral neck  fracture on the right with varus angulation at the fracture site. Total hip replacement on the left with prosthetic components appearing well-seated. No dislocation. Moderate narrowing right hip joint. Electronically Signed   By: Lowella Grip III M.D.   On: 10/09/2017 15:11    Review of Systems  Constitutional: Negative.   HENT: Negative.   Eyes: Negative.   Respiratory: Negative.   Cardiovascular: Negative.   Gastrointestinal: Negative.   Genitourinary: Negative.   Musculoskeletal: Positive for joint pain.  Skin: Negative.   Neurological: Negative.   Endo/Heme/Allergies: Negative.   Psychiatric/Behavioral: Negative.    Blood pressure (!) 167/81, pulse 82, temperature 97.7 F (36.5 C), temperature source Oral, resp. rate 18, height 6' (1.829 m), weight 72.5 kg (159 lb 13.3 oz), SpO2 96 %. Physical Exam  Constitutional: He appears distressed.  HENT:  Head: Normocephalic.  Eyes: Pupils are equal, round, and reactive to light.  Cardiovascular: Normal rate.  Respiratory: Effort normal.  GI: Soft.  Musculoskeletal: He exhibits deformity.  Malrotated Right Hip  Neurological: He is alert.  Skin: Skin is warm.  Psychiatric: He has a normal mood and affect.    Assessment/Plan: Admit for a Total Hip Replacement on the Right.  Latanya Maudlin 10/09/2017, 4:40 PM

## 2017-10-09 NOTE — ED Provider Notes (Signed)
[facility] DEPT Provider Note   CSN: [id number] Arrival date & time: 10/09/17  1349     History   Chief Complaint Chief Complaint  Patient presents with  . Hip Injury    HPI Richard Spears is a 84 y.o. male.   Hip Pain  This is a new problem. The current episode started less than 1 hour ago. The problem occurs constantly. The problem has been gradually improving. Pertinent negatives include no chest pain, no abdominal pain, no headaches and no shortness of breath. Exacerbated by: movement, touch. Relieved by: immobilization and fentanyl. The treatment provided mild relief.   84 year old male who presents the emergency department with chief complaint of right hip pain.  He had a mechanical fall today and fell directly onto the right hip.  He did not hit his head or lose consciousness and has no other injury complaints.  He had immediate severe pain with shortened externally rotated leg unable to ambulate or move.  He has a history of previous left total hip arthroplasty by Dr. Wynelle Link. He denies numbness or tingling.  Past Medical History:  Diagnosis Date  . Anemia    leakoppenia  . BPH (benign prostatic hypertrophy)   . Bullous pemphigoid    [name], March 2011, right forearm squamous cell carcinoma  . Chronic anticoagulation    systemic  . Colon polyp    transverse, 2002  . History of peptic ulcer    remote, 3/95  . Hx of actinic keratosis   . Hx of basal cell carcinoma   . Hx of squamous cell carcinoma of skin   . Hyperlipidemia   . Left inguinal hernia   . Moderate aortic insufficiency 2009   audible aortic insufficiency on 1/09 echo  . PAF (paroxysmal atrial fibrillation) ([place]) 01/17/2014   On Warfarin.  . S/P mitral valve replacement with metallic valve 54/5625   INR goal 2.5-3.5, St Jude,   . 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 Essential hypertension 05/12/2017  . Exertional dyspnea 01/18/2016  . Venous insufficiency 02/14/2015  . Cancer of skin of right lower leg 10/31/2014  . AI (aortic insufficiency) 01/23/2014  . Paroxysmal atrial fibrillation ([place]) 01/17/2014  . Hyperlipidemia   . Mitral valve disorder 05/09/2013  . S/P MVR (mitral valve replacement) 04/08/1996    Past Surgical History:  Procedure Laterality Date  . Electrodesiccation and Curettage and Shave Biopsy Right    Right medial, anterio tibia: Well differentiated Squamous Cell  . hip replacements Left    10 years ago  . MITRAL VALVE REPLACEMENT  03/1996   St. Jude mechanical valve  . TRANSTHORACIC ECHOCARDIOGRAM  01/19/2011   EF 60-65%. No regional wall motion abnormality. Mechanical mitral valve in place. Moderate aortic regurgitation.  . TRANSTHORACIC ECHOCARDIOGRAM  08/'17; 10/'18    a) Mild conc LVH. EF 55-60%. No RWMA. Mod AI. Mechanical MV prosthesis functioning properly. LAD dilation.;; b)  EF 55-60%.  Mo AI.  Bileaflet Saint Jude mechanical MV with no paravalvular leak.  Severe LA dilation.  Minimally elevated PAP        Home Medications    Prior to Admission medications   Medication Sig Start Date End Date Taking? Authorizing Provider  bisoprolol (ZEBETA) 5 MG tablet Take 1 tablet (5 mg total) by mouth daily. 03/14/17   Leonie Man, MD  COUMADIN  5 MG tablet Take 1 to 1 and 1/2 tablets daily as directed by coumadin clinic 03/14/17   Leonie Man, MD  fluticasone [facility]) 50 MCG/ACT nasal spray Place 2 sprays into both nostrils as needed for allergies or rhinitis.    [provider]  furosemide (LASIX) 40 MG tablet Take 1 tablet (40 mg total) by mouth daily and take an additional 0.5 tablet (20 mg total) by mouth 3 days a week. 07/27/17   Leonie Man, MD  hydrALAZINE (APRESOLINE) 25 MG tablet Take 1 tablet (25 mg total) by mouth daily at lunchtime. Take an additional tablet as needed for systolic blood  pressure >818 mmHg 07/27/17   Leonie Man, MD  isosorbide mononitrate (IMDUR) 30 MG 24 hr tablet TAKE 1 TABLET DAILY 10/06/17   Leonie Man, MD  ketoconazole (NIZORAL) 2 % shampoo Apply 1 application topically 2 (two) times a week.    [provider]  LORazepam (ATIVAN) 1 MG tablet Take 1 mg by mouth every 8 (eight) hours as needed for anxiety.     [provider]  Multiple Vitamins-Minerals (MULTIVITAMIN PO) Take 1 tablet by mouth daily.    [provider]  warfarin (COUMADIN) 5 MG tablet Take 1 to 1.5 tablets by mouth daily as directed 05/23/17   Leonie Man, MD    Family History Family History  Problem Relation Age of Onset  . Hypertension Mother   . Lung cancer Sister   . COPD Brother   . Cancer Brother   . Other Sister        polio  . Lupus Son     Social History Social History   Tobacco Use  . Smoking status: Former Smoker    Packs/day: 1.00    Years: 8.00    Pack years: 8.00    Types: Cigarettes    Last attempt to quit: 10/31/1954    Years since quitting: 62.9  . Smokeless tobacco: Never Used  Substance Use Topics  . Alcohol use: Yes    Alcohol/week: 0.0 oz    Comment: 1-2 drinks per day  . Drug use: Never     Allergies   Flexeril [cyclobenzaprine]   Review of Systems Review of Systems  Respiratory: Negative for shortness of breath.   Cardiovascular: Negative for chest pain.  Gastrointestinal: Negative for abdominal pain.  Neurological: Negative for headaches.   Ten systems reviewed and are negative for acute change, except as noted in the HPI.    Physical Exam Updated Vital Signs BP (!) 178/90 (BP Location: Left Arm)   Pulse 80   Temp 97.7 F (36.5 C) (Oral)   Resp 15   Ht 6' (1.829 m)   Wt 72.5 kg (159 lb 13.3 oz)   BMI 21.68 kg/m   Physical Exam  Constitutional: He appears well-developed and well-nourished. No distress.  HENT:  Head: Normocephalic and atraumatic.  Eyes: Conjunctivae are normal. No  scleral icterus.  Neck: Normal range of motion. Neck supple.  Cardiovascular: Normal rate, regular rhythm and normal heart sounds.  Pulmonary/Chest: Effort normal and breath sounds normal. No respiratory distress.  Abdominal: Soft. There is no tenderness.  Musculoskeletal: He exhibits no edema.  Right leg shortened and externally rotated.  There is swelling at the proximal femur and hip.  Exquisitely tender to palpation, normal pulse and sensation in the right foot.  Neurological: He is alert.  Skin: Skin is warm and dry. He is not diaphoretic.  Psychiatric: His behavior is normal.  Nursing note and vitals reviewed.    ED Treatments / Results  Labs (all labs ordered are listed, but only abnormal results are displayed) Labs Reviewed  CBC WITH DIFFERENTIAL/PLATELET  PROTIME-INR  COMPREHENSIVE METABOLIC PANEL  TYPE AND SCREEN    EKG EKG Interpretation  Date/Time:  Sunday October 09 2017 14:02:29 EDT Ventricular Rate:  79 PR Interval:    QRS Duration: 117 QT Interval:  418 QTC Calculation: 480 R Axis:   63 Text Interpretation:  Sinus rhythm Nonspecific intraventricular conduction delay Nonspecific T abnrm, anterolateral leads Confirmed by Fredia Sorrow [phone]) on 10/09/2017 2:50:28 PM Also confirmed by Fredia Sorrow [phone]), editor Laurena Spies [phone])  on 10/09/2017 3:27:36 PM   Radiology No results found.  Procedures Procedures (including critical care time)  Medications Ordered in ED Medications  ondansetron (ZOFRAN) injection 4 mg (has no administration in time range)  fentaNYL (SUBLIMAZE) injection 50 mcg (has no administration in time range)     Initial Impression / Assessment and Plan / ED Course  I have reviewed the triage vital signs and the nursing notes.  Pertinent labs & imaging results that were available during my care of the patient were reviewed by me and considered in my medical decision making (see chart for details).  Clinical Course as of  Oct 09 1552  Sun Oct 09, 2017  1553 I spoke with Dr. Gladstone Lighter who will come to the ED to see the Patient.    [AH]    Clinical Course User Index [AH] Margarita Mail, PA-C    Patient with right subcapital hip fracture. Is subtherapeutic on his INR.  He is on Coumadin for mechanical heart valve.  He has no other visible injuries.  Patient will be admitted by the hospitalist service for his hip fracture.  I have reviewed labs and imaging.  Nonischemic EKG.  Final Clinical Impressions(s) / ED Diagnoses   Final diagnoses:  Closed fracture of right hip, initial encounter [facility])    ED Discharge Orders    None       Margarita Mail, PA-C 10/09/17 1703    Fredia Sorrow, MD 10/11/17 [phone]

## 2017-10-09 NOTE — H&P (Signed)
History and Physical    [name]:[id number] DOB: July 20, 1925 DOA: 10/09/2017  PCP: [name], MD  Patient coming from: [facility]  I have personally briefly reviewed patient's old medical records in [place]  Chief Complaint: fall  HPI: Richard Spears is Richard Spears 84 y.o. male with medical history significant of anemia, BPH, Lamount Bankson. fib, mechanical mitral valve on warfarin, L hip replacement and several other medical problems who presents after mechanical fall with Richard Spears right hip fracture.   He notes that he was walking to the dining hall.  He slipped on mud on the path and fell onto the grass hitting his right hip.  He notes that he did not not hit any other part of his body.  He did not hit his head.  He did not have any loss of consciousness.  He notes [name] dull pain to his hip which is sharp with movement.  It is modestly improved with the medication that we have given him here.  He denies any chest pain, shortness of breath, abdominal pain, fevers, chills, nausea, vomiting, lower extremity-edema.    ED Course: Labs, CXR, EKG, X ray R hip.  Hospitalist to admit.  Review of Systems: As per HPI otherwise 10 point review of systems negative.   Past Medical History:  Diagnosis Date  . Anemia    leakoppenia  . BPH (benign prostatic hypertrophy)   . Bullous pemphigoid    Wilhemina Spears, March 2011, right forearm squamous cell carcinoma  . Chronic anticoagulation    systemic  . Colon polyp    transverse, 2002  . History of peptic ulcer    remote, 3/95  . Hx of actinic keratosis   . Hx of basal cell carcinoma   . Hx of squamous cell carcinoma of skin   . Hyperlipidemia   . Left inguinal hernia   . Moderate aortic insufficiency 2009   audible aortic insufficiency on 1/09 echo  . PAF (paroxysmal atrial fibrillation) ([place]) 01/17/2014   On Warfarin.  . S/P mitral valve replacement with metallic valve [id number]   INR goal 2.5-3.5, St Jude,   . 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 Electrodesiccation and Curettage and Shave Biopsy Right    Right medial, anterio tibia: Well differentiated Squamous Cell  . hip replacements Left    10 years ago  . MITRAL VALVE REPLACEMENT  03/1996   St. Jude mechanical valve  . TRANSTHORACIC ECHOCARDIOGRAM  01/19/2011   EF 60-65%. No regional wall motion abnormality. Mechanical mitral valve in place. Moderate aortic regurgitation.  . TRANSTHORACIC ECHOCARDIOGRAM  08/'17; 10/'18    [name]) Mild conc LVH. EF 55-60%. No RWMA. Mod AI. Mechanical MV prosthesis functioning properly. LAD dilation.;; b)  EF 55-60%.  Mo AI.  Bileaflet Saint Jude mechanical MV with no paravalvular leak.  Severe LA dilation.  Minimally elevated PAP     reports that he quit smoking about 62 years ago. His smoking use included cigarettes. He has Richard Spears 8.00 pack-year smoking history. He has never used smokeless tobacco. He reports that he drinks alcohol. He reports that he does not use drugs.  Allergies  Allergen Reactions  . Flexeril [Cyclobenzaprine] Diarrhea    Family History  Problem Relation Age of Onset  . Hypertension Mother   . Lung cancer Sister   . COPD Brother   . Cancer Brother   .  Other Sister        polio  . Lupus Son    Prior to Admission medications   Medication Sig Start Date End Date Taking? Authorizing Provider  bisoprolol (ZEBETA) 5 MG tablet Take 1 tablet (5 mg total) by mouth daily. 03/14/17  Yes Leonie Man, MD  COUMADIN 5 MG tablet Take 1 to 1 and 1/2 tablets daily as directed by coumadin clinic 03/14/17  Yes Leonie Man, MD  fluticasone [facility]) 50 MCG/ACT nasal spray Place 2 sprays into both nostrils as needed for allergies or rhinitis.   Yes [provider]  furosemide (LASIX) 40 MG tablet Take 1 tablet (40 mg total) by mouth daily and take an additional 0.5 tablet (20 mg total) by mouth 3 days Rhonna Holster week. 07/27/17  Yes  Leonie Man, MD  hydrALAZINE (APRESOLINE) 25 MG tablet Take 1 tablet (25 mg total) by mouth daily at lunchtime. Take an additional tablet as needed for systolic blood pressure >361 mmHg 07/27/17  Yes Leonie Man, MD  isosorbide mononitrate (IMDUR) 30 MG 24 hr tablet TAKE 1 TABLET DAILY Patient taking differently: TAKE 1 TABLET (30 mg) DAILY 10/06/17  Yes Leonie Man, MD  ketoconazole (NIZORAL) 2 % shampoo Apply 1 application topically 2 (two) times [name] week.   Yes [provider]  LORazepam (ATIVAN) 1 MG tablet Take 1 mg by mouth every 8 (eight) hours as needed for anxiety.    Yes [provider]  Multiple Vitamins-Minerals (MULTIVITAMIN PO) Take 1 tablet by mouth daily.   Yes [provider]  warfarin (COUMADIN) 5 MG tablet Take 1 to 1.5 tablets by mouth daily as directed Patient not taking: Reported on 10/09/2017 05/23/17   Leonie Man, MD    Physical Exam: Vitals:   10/09/17 1551 10/09/17 1700 10/09/17 1756 10/09/17 1911  BP: (!) 167/81 (!) 177/85 (!) 168/84 (!) 157/84  Pulse: 82 83 91 94  Resp: 18 16 18 16   Temp:    98.3 F (36.8 C)  TempSrc:    Oral  SpO2: 96% 94% 94% 90%  Weight:      Height:        Constitutional: NAD, calm, comfortable Vitals:   10/09/17 1551 10/09/17 1700 10/09/17 1756 10/09/17 1911  BP: (!) 167/81 (!) 177/85 (!) 168/84 (!) 157/84  Pulse: 82 83 91 94  Resp: 18 16 18 16   Temp:    98.3 F (36.8 C)  TempSrc:    Oral  SpO2: 96% 94% 94% 90%  Weight:      Height:       Eyes: PERRL, lids and conjunctivae normal ENMT: Mucous membranes are moist. Posterior pharynx clear of any exudate or lesions.Normal dentition.  Neck: normal, supple, no masses, no thyromegaly Respiratory: clear to auscultation bilaterally, no wheezing, no crackles. Normal respiratory effort. No accessory muscle use.  Cardiovascular: Regular rate and rhythm, no murmurs / rubs / gallops. No extremity edema. 2+ pedal pulses. No carotid bruits.    Abdomen: no tenderness, no masses palpated. No hepatosplenomegaly. Bowel sounds positive.  Musculoskeletal: R leg shortened and externally rotated. Skin: no rashes, lesions, ulcers. No induration Neurologic: CN 2-12 grossly intact. Sensation intact. Moves all extremities.  Able to wiggle toes on R foot.  Psychiatric: Normal judgment and insight. Alert and oriented x 3. Normal mood.   Labs on Admission: I have personally reviewed following labs and imaging studies  CBC: Recent Labs  Lab 10/09/17 1501  WBC 3.7*  NEUTROABS 3.0  HGB 12.5*  HCT 36.6*  MCV 98.4  PLT 623*   Basic Metabolic Panel: Recent Labs  Lab 10/09/17 1501  NA 135  K 4.5  CL 99*  CO2 23  GLUCOSE 111*  BUN 26*  CREATININE 1.26*  CALCIUM 9.0   GFR: Estimated Creatinine Clearance: 40 mL/min (Nickole Adamek) (by C-G formula based on SCr of 1.26 mg/dL (H)). Liver Function Tests: Recent Labs  Lab 10/09/17 1501  AST 39  ALT 21  ALKPHOS 59  BILITOT 1.3*  PROT 7.1  ALBUMIN 4.2   No results for input(s): LIPASE, AMYLASE in the last 168 hours. No results for input(s): AMMONIA in the last 168 hours. Coagulation Profile: Recent Labs  Lab 10/09/17 1501  INR 2.39   Cardiac Enzymes: No results for input(s): CKTOTAL, CKMB, CKMBINDEX, TROPONINI in the last 168 hours. BNP (last 3 results) No results for input(s): PROBNP in the last 8760 hours. HbA1C: No results for input(s): HGBA1C in the last 72 hours. CBG: No results for input(s): GLUCAP in the last 168 hours. Lipid Profile: No results for input(s): CHOL, HDL, LDLCALC, TRIG, CHOLHDL, LDLDIRECT in the last 72 hours. Thyroid Function Tests: No results for input(s): TSH, T4TOTAL, FREET4, T3FREE, THYROIDAB in the last 72 hours. Anemia Panel: No results for input(s): VITAMINB12, FOLATE, FERRITIN, TIBC, IRON, RETICCTPCT in the last 72 hours. Urine analysis: No results found for: COLORURINE, APPEARANCEUR, [place], [place], [place], [place], [place], [place],  [place], [place], NITRITE, LEUKOCYTESUR  Radiological Exams on Admission: Dg Chest 1 View  Result Date: 10/09/2017 CLINICAL DATA:  Pain following fall EXAM: CHEST  1 VIEW COMPARISON:  April 22, 2010 FINDINGS: There is no appreciable edema or consolidation. Heart is upper normal in size with pulmonary vascularity within normal limits. Patient is status post mitral valve replacement. There is aortic atherosclerosis. No adenopathy. No bone lesions. No pneumothorax. IMPRESSION: No edema or consolidation. No pneumothorax. Heart upper normal in size. Status post mitral valve replacement. Aortic atherosclerosis present. Aortic Atherosclerosis (ICD10-I70.0). Electronically Signed   By: Lowella Grip III M.D.   On: 10/09/2017 15:12   Dg Hip Unilat With Pelvis 2-3 Views Right  Result Date: 10/09/2017 CLINICAL DATA:  Pain following fall EXAM: DG HIP (WITH OR WITHOUT PELVIS) 2-3V RIGHT COMPARISON:  None. FINDINGS: Frontal pelvis as well as frontal and lateral right hip images were obtained. There is Liseth Wann subcapital femoral neck fracture on the right with varus angulation at the fracture site. There is Tameria Patti total hip replacement left with prosthetic components well-seated. No other fracture. No dislocation. There is moderate narrowing of the right hip joint. IMPRESSION: Subcapital femoral neck fracture on the right with varus angulation at the fracture site. Total hip replacement on the left with prosthetic components appearing well-seated. No dislocation. Moderate narrowing right hip joint. Electronically Signed   By: Lowella Grip III M.D.   On: 10/09/2017 15:11    EKG: Independently reviewed. Sinus rhythm.  Slightly prolonged QTc.  T wave inversion in V1-2, avl.  Isolated Q in aVL.  Assessment/Plan Active Problems:   Hip fracture (HCC)  Right Subcapital Femoral Neck Fracture:  RCRI 0 >4 mets Plan for OR tomorrow with Dr. Maureen Ralphs Will need perioperative bridging with heparin given afib and  mechanical mitral valve.  INR 2.39, subtherapeutic for goal for 2.5-3.5.  Surgery would like INR <2 prior.  Will give dose of vitamin K.   Will need to hold heparin 4-6 hrs prior to surgery and resume as soon as safely possible post op (will need to  discuss when ok with surgery to restart) Schedule APAP, oxy, morphine Bowel regimen  Pancytopenia:  Mildly low platelets and WBC count.  WBC count is chronic.  Mild anemia.  Follow.   Atrial Fibrillation  Mechanical Mitral Valve: INR goal 2.5-3.5.  Will need perioperative bridging.  Continue bisoprolol.  HTN: continue isosorbide and hydral.  Continue bisoprolol.  Hold lasix.  Elevated BP in ED, likely due to pain.  Follow.   Anxiety: continue ativan  DVT prophylaxis: heparin gtt  Code Status: full  Family Communication: none at bedside  Disposition Plan: pending OR with ortho  Consults called: orthopedics  Admission status: inpatient    Fayrene Helper MD [facility] Pager [phone]  If 7PM-7AM, please contact night-coverage [email or web address] Password [email or web address]  10/09/2017, 8:00 PM

## 2017-10-09 NOTE — ED Notes (Signed)
Patient transported to X-ray 

## 2017-10-09 NOTE — Progress Notes (Signed)
ANTICOAGULATION CONSULT NOTE - Initial Consult  Pharmacy Consult for Heparin Indication: atrial fibrillation, mechanical mitral valve (PTA warfarin held)  Allergies  Allergen Reactions  . Flexeril [Cyclobenzaprine] Diarrhea    Patient Measurements: Height: 6' (182.9 cm) Weight: 159 lb 13.3 oz (72.5 kg) IBW/kg (Calculated) : 77.6 Heparin Dosing Weight: TBW  Vital Signs: Temp: 97.7 F (36.5 C) (04/14 1403) Temp Source: Oral (04/14 1403) BP: 177/85 (04/14 1700) Pulse Rate: 83 (04/14 1700)  Labs: Recent Labs    10/09/17 1501  HGB 12.5*  HCT 36.6*  PLT 134*  LABPROT 25.8*  INR 2.39  CREATININE 1.26*    Estimated Creatinine Clearance: 40 mL/min (A) (by C-G formula based on SCr of 1.26 mg/dL (H)).   Medical History: Past Medical History:  Diagnosis Date  . Anemia    leakoppenia  . BPH (benign prostatic hypertrophy)   . Bullous pemphigoid    [name], March 2011, right forearm squamous cell carcinoma  . Chronic anticoagulation    systemic  . Colon polyp    transverse, 2002  . History of peptic ulcer    remote, 3/95  . Hx of actinic keratosis   . Hx of basal cell carcinoma   . Hx of squamous cell carcinoma of skin   . Hyperlipidemia   . Left inguinal hernia   . Moderate aortic insufficiency 2009   audible aortic insufficiency on 1/09 echo  . PAF (paroxysmal atrial fibrillation) ([place]) 01/17/2014   On Warfarin.  . S/P mitral valve replacement with metallic valve 60/[id number]   INR goal 2.5-3.5, St Jude,   . Squamous cell carcinoma in situ of skin of right lower leg 10/15/14   [place]    Medications:   (Not in a hospital admission) Scheduled:  . phytonadione  2.5 mg Oral Once   Infusions:    Assessment: 21 yoM [facility] ED on 4[facility]14 with right hip fracture.  PMH is significant for chronic warfarin anticoagulation for afib and mechanical mitral valve replacement.  Vit K given to reverse INR prior to planned surgery.  Pharmacy is consulted to dose Heparin  perioperatively.  PTA warfarin 5mg  daily except 7.5 mg on Sun/Wed/Fridays.  Last dose taken on 10/08/17. INR on admission is subtherapeutic at 2.39 (goal INR 2.5-3.5) CBC: Hgb 12.5, Plt 134   Goal of Therapy:  Heparin level 0.3-0.7 units/ml Monitor platelets by anticoagulation protocol: Yes   Plan:  No heparin bolus given near therapeutic INR. Start heparin IV infusion at 1000 units/hr Heparin level 8 hours after starting Daily heparin level and CBC Continue to monitor H&H and platelets   Gretta Arab PharmD, BCPS Pager [phone] 10/09/2017,5:41 PM

## 2017-10-09 NOTE — ED Provider Notes (Signed)
Medical screening examination/treatment/procedure(s) were conducted as a shared visit with non-physician practitioner(s) and myself.  I personally evaluated the patient during the encounter.  EKG Interpretation  Date/Time:  Sunday October 09 2017 14:02:29 EDT Ventricular Rate:  79 PR Interval:    QRS Duration: 117 QT Interval:  418 QTC Calculation: 480 R Axis:   63 Text Interpretation:  Sinus rhythm Nonspecific intraventricular conduction delay Nonspecific T abnrm, anterolateral leads Confirmed by Narjis Mira ([id number]) on 10/09/2017 2:50:28 PM Also confirmed by Anil Havard ([id number]), editor Everhart, Marilyn ([id number])  on 10/09/2017 3:27:36 PM   Results for orders placed or performed during the hospital encounter of 10/09/17  CBC WITH DIFFERENTIAL  Result Value Ref Range   WBC 3.7 (L) 4.0 - 10.5 K/uL   RBC 3.72 (L) 4.22 - 5.81 MIL/uL   Hemoglobin 12.5 (L) 13.0 - 17.0 g/dL   HCT 36.6 (L) 39.0 - 52.0 %   MCV 98.4 78.0 - 100.0 fL   MCH 33.6 26.0 - 34.0 pg   MCHC 34.2 30.0 - 36.0 g/dL   RDW 14.4 11.5 - 15.5 %   Platelets 134 (L) 150 - 400 K/uL   Neutrophils Relative % 82 %   Neutro Abs 3.0 1.7 - 7.7 K/uL   Lymphocytes Relative 10 %   Lymphs Abs 0.4 (L) 0.7 - 4.0 K/uL   Monocytes Relative 7 %   Monocytes Absolute 0.3 0.1 - 1.0 K/uL   Eosinophils Relative 1 %   Eosinophils Absolute 0.0 0.0 - 0.7 K/uL   Basophils Relative 0 %   Basophils Absolute 0.0 0.0 - 0.1 K/uL  Protime-INR  Result Value Ref Range   Prothrombin Time 25.8 (H) 11.4 - 15.2 seconds   INR 2.39   Comprehensive metabolic panel  Result Value Ref Range   Sodium 135 135 - 145 mmol/L   Potassium 4.5 3.5 - 5.1 mmol/L   Chloride 99 (L) 101 - 111 mmol/L   CO2 23 22 - 32 mmol/L   Glucose, Bld 111 (H) 65 - 99 mg/dL   BUN 26 (H) 6 - 20 mg/dL   Creatinine, Ser 1.26 (H) 0.61 - 1.24 mg/dL   Calcium 9.0 8.9 - 10.3 mg/dL   Total Protein 7.1 6.5 - 8.1 g/dL   Albumin 4.2 3.5 - 5.0 g/dL   AST 39 15 - 41 U/L   ALT 21 17 - 63  U/L   Alkaline Phosphatase 59 38 - 126 U/L   Total Bilirubin 1.3 (H) 0.3 - 1.2 mg/dL   GFR calc non Af Amer 48 (L) >60 mL/min   GFR calc Af Amer 56 (L) >60 mL/min   Anion gap 13 5 - 15  Type and screen [facility]  Result Value Ref Range   ABO/RH(D) O POS    Antibody Screen NEG    Sample Expiration      04 /17/2019 Performed at [facility], [place] [address], [place], [place] [id number]    Dg Chest 1 View  Result Date: 10/09/2017 CLINICAL DATA:  Pain following fall EXAM: CHEST  1 VIEW COMPARISON:  April 22, 2010 FINDINGS: There is no appreciable edema or consolidation. Heart is upper normal in size with pulmonary vascularity within normal limits. Patient is status post mitral valve replacement. There is aortic atherosclerosis. No adenopathy. No bone lesions. No pneumothorax. IMPRESSION: No edema or consolidation. No pneumothorax. Heart upper normal in size. Status post mitral valve replacement. Aortic atherosclerosis present. Aortic Atherosclerosis (ICD10-I70.0). Electronically Signed   By: Gwyndolyn Saxon  Jasmine December III M.D.   On: 10/09/2017 15:12   Dg Hip Unilat With Pelvis 2-3 Views Right  Result Date: 10/09/2017 CLINICAL DATA:  Pain following fall EXAM: DG HIP (WITH OR WITHOUT PELVIS) 2-3V RIGHT COMPARISON:  None. FINDINGS: Frontal pelvis as well as frontal and lateral right hip images were obtained. There is a subcapital femoral neck fracture on the right with varus angulation at the fracture site. There is a total hip replacement left with prosthetic components well-seated. No other fracture. No dislocation. There is moderate narrowing of the right hip joint. IMPRESSION: Subcapital femoral neck fracture on the right with varus angulation at the fracture site. Total hip replacement on the left with prosthetic components appearing well-seated. No dislocation. Moderate narrowing right hip joint. Electronically Signed   By: Lowella Grip III M.D.   On: 10/09/2017  15:11   Patient seen by me along with physician assistant.  Patient accidentally slipped while walking.  [place] was concerned that he broke his right hip x-rays confirm that.  No other injuries.  Patient has had a hip replacement on the left side in the past several years ago by Dr. Reynaldo Minium.  Patient will require admission most likely based on his age by medicine and orthopedics will be contacted.  Labs without significant abnormalities.  Patient deformity to right hip  Leg is shortened dorsalis pedis pulse is 2+ good movement of toes sensation intact.  No other injuries no head injury.  Abdomen soft nontender lungs are clear.  Patient is alert and oriented.   Fredia Sorrow, MD 10/09/17 1610

## 2017-10-09 NOTE — ED Notes (Signed)
ED TO INPATIENT HANDOFF REPORT  Name/Age/Gender Richard Spears 84 y.o. male  Code Status   Home/SNF/Other Home  Chief Complaint fall, hip pain  Level of Care/Admitting Diagnosis ED Disposition    ED Disposition Condition Richard Spears Area: [place] [[id number]]  Level of Care: Telemetry [5]  Admit to tele based on following criteria: Other see comments  Comments: hx afib, surgery  Diagnosis: Hip fracture Richard Spears) [[id number]]  Admitting Physician: Elodia Florence [phone]  Attending Physician: Cephus Slater, A CALDWELL [phone]  Estimated length of stay: past midnight tomorrow  Certification:: I certify this patient will need inpatient services for at least 2 midnights  PT Class (Do Not Modify): Inpatient [101]  PT Acc Code (Do Not Modify): Private [1]       Medical History Past Medical History:  Diagnosis Date  . Anemia    leakoppenia  . BPH (benign prostatic hypertrophy)   . Bullous pemphigoid    [name], March 2011, right forearm squamous cell carcinoma  . Chronic anticoagulation    systemic  . Colon polyp    transverse, 2002  . History of peptic ulcer    remote, 3/95  . Hx of actinic keratosis   . Hx of basal cell carcinoma   . Hx of squamous cell carcinoma of skin   . Hyperlipidemia   . Left inguinal hernia   . Moderate aortic insufficiency 2009   audible aortic insufficiency on 1/09 echo  . PAF (paroxysmal atrial fibrillation) ([place]) 01/17/2014   On Warfarin.  . S/P mitral valve replacement with metallic valve 59/2924   INR goal 2.5-3.5, St Jude,   . 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 Flexeril [Cyclobenzaprine] Diarrhea    IV Location/Drains/Wounds Patient Lines/Drains/Airways Status   Active Line/Drains/Airways    Name:   Placement date:   Placement time:   Site:   Days:   Peripheral IV 10/09/17 Right Antecubital    10/09/17    1434    Antecubital   less than 1   External Urinary Catheter   10/09/17    1435    -   less than 1          Labs/Imaging Results for orders placed or performed during the Spears encounter of 10/09/17 (from the past 48 hour(s))  Type and screen [place]     Status: None   Collection Time: 10/09/17  3:00 PM  Result Value Ref Range   ABO/RH(D) O POS    Antibody Screen NEG    Sample Expiration      10/12/2017 Performed at [facility], [place] [address] [place], [place] [address]   CBC WITH DIFFERENTIAL     Status: Abnormal   Collection Time: 10/09/17  3:01 PM  Result Value Ref Range   WBC 3.7 (L) 4.0 - 10.5 K/uL   RBC 3.72 (L) 4.22 - 5.81 MIL/uL   Hemoglobin 12.5 (L) 13.0 - 17.0 g/dL   HCT 36.6 (L) 39.0 - 52.0 %   MCV 98.4 78.0 - 100.0 fL   MCH 33.6 26.0 - 34.0 pg   MCHC 34.2 30.0 - 36.0 g/dL   RDW 14.4 11.5 - 15.5 %   Platelets 134 (L) 150 - 400 K/uL   Neutrophils Relative % 82 %   Neutro Abs 3.0 1.7 - 7.7 K/uL   Lymphocytes  Relative 10 %   Lymphs Abs 0.4 (L) 0.7 - 4.0 K/uL   Monocytes Relative 7 %   Monocytes Absolute 0.3 0.1 - 1.0 K/uL   Eosinophils Relative 1 %   Eosinophils Absolute 0.0 0.0 - 0.7 K/uL   Basophils Relative 0 %   Basophils Absolute 0.0 0.0 - 0.1 K/uL    Comment: Performed at Richard Spears, [place] [address] [place], [place] [address]  Protime-INR     Status: Abnormal   Collection Time: 10/09/17  3:01 PM  Result Value Ref Range   Prothrombin Time 25.8 (H) 11.4 - 15.2 seconds   INR 2.39     Comment: Performed at Richard [facility], [place] [address], [place], [place] [address]  Comprehensive metabolic panel     Status: Abnormal   Collection Time: 10/09/17  3:01 PM  Result Value Ref Range   Sodium 135 135 - 145 mmol/L   Potassium 4.5 3.5 - 5.1 mmol/L   Chloride 99 (L) 101 - 111 mmol/L   CO2 23 22 - 32 mmol/L   Glucose, Bld 111 (H) 65 - 99 mg/dL   BUN 26 (H) 6 - 20 mg/dL    Creatinine, Ser 1.26 (H) 0.61 - 1.24 mg/dL   Calcium 9.0 8.9 - 10.3 mg/dL   Total Protein 7.1 6.5 - 8.1 g/dL   Albumin 4.2 3.5 - 5.0 g/dL   AST 39 15 - 41 U/L   ALT 21 17 - 63 U/L   Alkaline Phosphatase 59 38 - 126 U/L   Total Bilirubin 1.3 (H) 0.3 - 1.2 mg/dL   GFR calc non Af Amer 48 (L) >60 mL/min   GFR calc Af Amer 56 (L) >60 mL/min    Comment: (NOTE) The eGFR has been calculated using the CKD EPI equation. This calculation has not been validated in all clinical situations. eGFR's persistently <60 mL/min signify possible Chronic Kidney Disease.    Anion gap 13 5 - 15    Comment: Performed at Richard Spears, [place] [address], [place], [place] [address]   Dg Chest 1 View  Result Date: 10/09/2017 CLINICAL DATA:  Pain following fall EXAM: CHEST  1 VIEW COMPARISON:  April 22, 2010 FINDINGS: There is no appreciable edema or consolidation. Heart is upper normal in size with pulmonary vascularity within normal limits. Patient is status post mitral valve replacement. There is aortic atherosclerosis. No adenopathy. No bone lesions. No pneumothorax. IMPRESSION: No edema or consolidation. No pneumothorax. Heart upper normal in size. Status post mitral valve replacement. Aortic atherosclerosis present. Aortic Atherosclerosis (ICD10-I70.0). Electronically Signed   By: Lowella Grip III M.D.   On: 10/09/2017 15:12   Dg Hip Unilat With Pelvis 2-3 Views Right  Result Date: 10/09/2017 CLINICAL DATA:  Pain following fall EXAM: DG HIP (WITH OR WITHOUT PELVIS) 2-3V RIGHT COMPARISON:  None. FINDINGS: Frontal pelvis as well as frontal and lateral right hip images were obtained. There is a subcapital femoral neck fracture on the right with varus angulation at the fracture site. There is a total hip replacement left with prosthetic components well-seated. No other fracture. No dislocation. There is moderate narrowing of the right hip joint. IMPRESSION: Subcapital femoral neck fracture on  the right with varus angulation at the fracture site. Total hip replacement on the left with prosthetic components appearing well-seated. No dislocation. Moderate narrowing right hip joint. Electronically Signed   By: Lowella Grip III M.D.   On: 10/09/2017 15:11    Pending Labs Unresulted  Labs (From admission, onward)   Start     Ordered   10/10/17 0500  CBC  Daily,   R     10/09/17 1826   10/10/17 0500  Protime-INR  Daily,   R     10/09/17 1826   10/10/17 0300  Heparin level (unfractionated)  Once-Timed,   R     10/09/17 1826   10/09/17 1709  Type and screen [place]  Once,   R    Comments:  [place]    10/09/17 1709   10/09/17 1500  ABO/Rh  Once,   R     10/09/17 1500   Signed and Held  Comprehensive metabolic panel  Tomorrow morning,   R     Signed and Held   Signed and Held  CBC  Tomorrow morning,   R     Signed and Held      Vitals/Pain Today's Vitals   10/09/17 1511 10/09/17 1551 10/09/17 1700 10/09/17 1756  BP:  (!) 167/81 (!) 177/85 (!) 168/84  Pulse:  82 83 91  Resp:  '18 16 18  '$ Temp:      TempSrc:      SpO2:  96% 94% 94%  Weight:      Height:      PainSc: 2        Isolation Precautions No active isolations  Medications Medications  fentaNYL (SUBLIMAZE) injection 50 mcg (50 mcg Intravenous Given 10/09/17 1717)  HYDROcodone-acetaminophen (NORCO/VICODIN) 5-325 MG per tablet 1 tablet (has no administration in time range)  oxyCODONE (Oxy IR/ROXICODONE) immediate release tablet 5-10 mg (has no administration in time range)  morphine 2 MG/ML injection 1 mg (has no administration in time range)  phytonadione (VITAMIN K) tablet 2.5 mg (has no administration in time range)  heparin ADULT infusion 100 units/mL (25000 units/248m sodium chloride 0.45%) (has no administration in time range)  ondansetron (ZOFRAN) injection 4 mg (4 mg Intravenous Given 10/09/17 1458)    Mobility non-ambulatory

## 2017-10-09 NOTE — Plan of Care (Signed)
  Problem: Education: Goal: Knowledge of General Education information will improve Outcome: Progressing   Problem: Health Behavior/Discharge Planning: Goal: Ability to manage health-related needs will improve Outcome: Progressing   Problem: Elimination: Goal: Will not experience complications related to urinary retention Outcome: Progressing   Problem: Pain Managment: Goal: General experience of comfort will improve Outcome: Progressing

## 2017-10-09 NOTE — H&P (View-Only) (Signed)
Reason for Consult:Right Hip Fracture Referring Physician: DR.Hendrixx Severin is an 84 y.o. male.  HPI: Patient from [place] was walking outside and slipped on the Mud and sustained a Closed,Displaced Right Femoral neck Fracture.  Past Medical History:  Diagnosis Date  . Anemia    leakoppenia  . BPH (benign prostatic hypertrophy)   . Bullous pemphigoid    [name], March 2011, right forearm squamous cell carcinoma  . Chronic anticoagulation    systemic  . Colon polyp    transverse, 2002  . History of peptic ulcer    remote, 3/95  . Hx of actinic keratosis   . Hx of basal cell carcinoma   . Hx of squamous cell carcinoma of skin   . Hyperlipidemia   . Left inguinal hernia   . Moderate aortic insufficiency 2009   audible aortic insufficiency on 1/09 echo  . PAF (paroxysmal atrial fibrillation) ([place]) 01/17/2014   On Warfarin.  . S/P mitral valve replacement with metallic valve 37/8588   INR goal 2.5-3.5, St Jude,   . 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 Electrodesiccation and Curettage and Shave Biopsy Right    Right medial, anterio tibia: Well differentiated Squamous Cell  . hip replacements Left    10 years ago  . MITRAL VALVE REPLACEMENT  03/1996   St. Jude mechanical valve  . TRANSTHORACIC ECHOCARDIOGRAM  01/19/2011   EF 60-65%. No regional wall motion abnormality. Mechanical mitral valve in place. Moderate aortic regurgitation.  . TRANSTHORACIC ECHOCARDIOGRAM  08/'17; 10/'18    a) Mild conc LVH. EF 55-60%. No RWMA. Mod AI. Mechanical MV prosthesis functioning properly. LAD dilation.;; b)  EF 55-60%.  Mo AI.  Bileaflet Saint Jude mechanical MV with no paravalvular leak.  Severe LA dilation.  Minimally elevated PAP    Family History  Problem Relation Age of Onset  . Hypertension Mother   . Lung cancer Sister   . COPD Brother   . Cancer Brother   . Other  Sister        polio  . Lupus Son     Social History:  reports that he quit smoking about 62 years ago. His smoking use included cigarettes. He has a 8.00 pack-year smoking history. He has never used smokeless tobacco. He reports that he drinks alcohol. He reports that he does not use drugs.  Allergies:  Allergies  Allergen Reactions  . Flexeril [Cyclobenzaprine] Diarrhea    Medications: I have reviewed the patient's current medications.  Results for orders placed or performed during the hospital encounter of 10/09/17 (from the past 48 hour(s))  Type and screen [place]     Status: None   Collection Time: 10/09/17  3:00 PM  Result Value Ref Range   ABO/RH(D) O POS    Antibody Screen NEG    Sample Expiration      10/12/2017 Performed at [facility], [place] [address] [place], [place] [address]   CBC WITH DIFFERENTIAL     Status: Abnormal   Collection Time: 10/09/17  3:01 PM  Result Value Ref Range   WBC 3.7 (L) 4.0 - 10.5 K/uL   RBC 3.72 (L) 4.22 - 5.81 MIL/uL   Hemoglobin 12.5 (L) 13.0 - 17.0 g/dL   HCT 36.6 (L) 39.0 - 52.0 %   MCV 98.4 78.0 - 100.0 fL   MCH  33.6 26.0 - 34.0 pg   MCHC 34.2 30.0 - 36.0 g/dL   RDW 14.4 11.5 - 15.5 %   Platelets 134 (L) 150 - 400 K/uL   Neutrophils Relative % 82 %   Neutro Abs 3.0 1.7 - 7.7 K/uL   Lymphocytes Relative 10 %   Lymphs Abs 0.4 (L) 0.7 - 4.0 K/uL   Monocytes Relative 7 %   Monocytes Absolute 0.3 0.1 - 1.0 K/uL   Eosinophils Relative 1 %   Eosinophils Absolute 0.0 0.0 - 0.7 K/uL   Basophils Relative 0 %   Basophils Absolute 0.0 0.0 - 0.1 K/uL    Comment: Performed at [facility], [place] [address] [place], [place] [address]  Protime-INR     Status: Abnormal   Collection Time: 10/09/17  3:01 PM  Result Value Ref Range   Prothrombin Time 25.8 (H) 11.4 - 15.2 seconds   INR 2.39     Comment: Performed at [facility], [place] [address] [place],  [place] [address]  Comprehensive metabolic panel     Status: Abnormal   Collection Time: 10/09/17  3:01 PM  Result Value Ref Range   Sodium 135 135 - 145 mmol/L   Potassium 4.5 3.5 - 5.1 mmol/L   Chloride 99 (L) 101 - 111 mmol/L   CO2 23 22 - 32 mmol/L   Glucose, Bld 111 (H) 65 - 99 mg/dL   BUN 26 (H) 6 - 20 mg/dL   Creatinine, Ser 1.26 (H) 0.61 - 1.24 mg/dL   Calcium 9.0 8.9 - 10.3 mg/dL   Total Protein 7.1 6.5 - 8.1 g/dL   Albumin 4.2 3.5 - 5.0 g/dL   AST 39 15 - 41 U/L   ALT 21 17 - 63 U/L   Alkaline Phosphatase 59 38 - 126 U/L   Total Bilirubin 1.3 (H) 0.3 - 1.2 mg/dL   GFR calc non Af Amer 48 (L) >60 mL/min   GFR calc Af Amer 56 (L) >60 mL/min    Comment: (NOTE) The eGFR has been calculated using the CKD EPI equation. This calculation has not been validated in all clinical situations. eGFR's persistently <60 mL/min signify possible Chronic Kidney Disease.    Anion gap 13 5 - 15    Comment: Performed at [facility], [place] [address], [place], [place] [address]    Dg Chest 1 View  Result Date: 10/09/2017 CLINICAL DATA:  Pain following fall EXAM: CHEST  1 VIEW COMPARISON:  April 22, 2010 FINDINGS: There is no appreciable edema or consolidation. Heart is upper normal in size with pulmonary vascularity within normal limits. Patient is status post mitral valve replacement. There is aortic atherosclerosis. No adenopathy. No bone lesions. No pneumothorax. IMPRESSION: No edema or consolidation. No pneumothorax. Heart upper normal in size. Status post mitral valve replacement. Aortic atherosclerosis present. Aortic Atherosclerosis (ICD10-I70.0). Electronically Signed   By: Lowella Grip III M.D.   On: 10/09/2017 15:12   Dg Hip Unilat With Pelvis 2-3 Views Right  Result Date: 10/09/2017 CLINICAL DATA:  Pain following fall EXAM: DG HIP (WITH OR WITHOUT PELVIS) 2-3V RIGHT COMPARISON:  None. FINDINGS: Frontal pelvis as well as frontal and lateral right hip images were  obtained. There is a subcapital femoral neck fracture on the right with varus angulation at the fracture site. There is a total hip replacement left with prosthetic components well-seated. No other fracture. No dislocation. There is moderate narrowing of the right hip joint. IMPRESSION: Subcapital femoral neck  fracture on the right with varus angulation at the fracture site. Total hip replacement on the left with prosthetic components appearing well-seated. No dislocation. Moderate narrowing right hip joint. Electronically Signed   By: Lowella Grip III M.D.   On: 10/09/2017 15:11    Review of Systems  Constitutional: Negative.   HENT: Negative.   Eyes: Negative.   Respiratory: Negative.   Cardiovascular: Negative.   Gastrointestinal: Negative.   Genitourinary: Negative.   Musculoskeletal: Positive for joint pain.  Skin: Negative.   Neurological: Negative.   Endo/Heme/Allergies: Negative.   Psychiatric/Behavioral: Negative.    Blood pressure (!) 167/81, pulse 82, temperature 97.7 F (36.5 C), temperature source Oral, resp. rate 18, height 6' (1.829 m), weight 72.5 kg (159 lb 13.3 oz), SpO2 96 %. Physical Exam  Constitutional: He appears distressed.  HENT:  Head: Normocephalic.  Eyes: Pupils are equal, round, and reactive to light.  Cardiovascular: Normal rate.  Respiratory: Effort normal.  GI: Soft.  Musculoskeletal: He exhibits deformity.  Malrotated Right Hip  Neurological: He is alert.  Skin: Skin is warm.  Psychiatric: He has a normal mood and affect.    Assessment/Plan: Admit for a Total Hip Replacement on the Right.  Latanya Maudlin 10/09/2017, 4:40 PM

## 2017-10-09 NOTE — ED Notes (Signed)
Bed: NT61 Expected date:  Expected time:  Means of arrival:  Comments: Hip injury

## 2017-10-09 NOTE — ED Triage Notes (Signed)
Pt arrived via [facility]. Pt comes from Nursing facility. Pt was walking on sidewalk and slipped and fell on right hip. Pt did not loose consciousness or hit head in anyway . Pt had a left hip replacement. pt is on coumadin for Mitral valve issue. Pt is AOx4 and normally walks without assistance.   216mcg of fentanyl given @ 1355 18g Right AC

## 2017-10-10 LAB — COMPREHENSIVE METABOLIC PANEL
ALK PHOS: 49 U/L (ref 38–126)
ALT: 23 U/L (ref 17–63)
ANION GAP: 7 (ref 5–15)
AST: 36 U/L (ref 15–41)
Albumin: 3.5 g/dL (ref 3.5–5.0)
BILIRUBIN TOTAL: 1.4 mg/dL — AB (ref 0.3–1.2)
BUN: 23 mg/dL — ABNORMAL HIGH (ref 6–20)
CALCIUM: 8.6 mg/dL — AB (ref 8.9–10.3)
CO2: 26 mmol/L (ref 22–32)
CREATININE: 1.16 mg/dL (ref 0.61–1.24)
Chloride: 99 mmol/L — ABNORMAL LOW (ref 101–111)
GFR, EST NON AFRICAN AMERICAN: 54 mL/min — AB (ref >60)
Glucose, Bld: 123 mg/dL — ABNORMAL HIGH (ref 65–99)
Potassium: 4.5 mmol/L (ref 3.5–5.1)
Sodium: 132 mmol/L — ABNORMAL LOW (ref 135–145)
TOTAL PROTEIN: 6.2 g/dL — AB (ref 6.5–8.1)

## 2017-10-10 LAB — CBC
HEMATOCRIT: 36.3 % — AB (ref 39.0–52.0)
Hemoglobin: 12.3 g/dL — ABNORMAL LOW (ref 13.0–17.0)
MCH: 33.4 pg (ref 26.0–34.0)
MCHC: 33.9 g/dL (ref 30.0–36.0)
MCV: 98.6 fL (ref 78.0–100.0)
PLATELETS: 120 10*3/uL — AB (ref 150–400)
RBC: 3.68 MIL/uL — ABNORMAL LOW (ref 4.22–5.81)
RDW: 14.3 % (ref 11.5–15.5)
WBC: 4.3 10*3/uL (ref 4.0–10.5)

## 2017-10-10 LAB — PROTIME-INR
INR: 2.66
Prothrombin Time: 28.1 seconds — ABNORMAL HIGH (ref 11.4–15.2)

## 2017-10-10 LAB — MAGNESIUM: Magnesium: 2.1 mg/dL (ref 1.7–2.4)

## 2017-10-10 LAB — HEPARIN LEVEL (UNFRACTIONATED)
HEPARIN UNFRACTIONATED: 0.3 [IU]/mL (ref 0.30–0.70)
HEPARIN UNFRACTIONATED: 0.37 [IU]/mL (ref 0.30–0.70)

## 2017-10-10 MED ORDER — ONDANSETRON HCL 4 MG/2ML IJ SOLN: 4.0000 mg | Freq: Four times a day (QID) | INTRAMUSCULAR | Status: DC | PRN

## 2017-10-10 NOTE — Progress Notes (Signed)
ANTICOAGULATION CONSULT NOTE - Follow Up Consult  Pharmacy Consult for Heparin Indication: atrial fibrillation, mechanical mitral valve (PTA warfarin held)  Allergies  Allergen Reactions  . Flexeril [Cyclobenzaprine] Diarrhea    Patient Measurements: Height: 6' (182.9 cm) Weight: 154 lb 1.6 oz (69.9 kg) IBW/kg (Calculated) : 77.6 Heparin Dosing Weight: TBW  Vital Signs: Temp: 98.3 F (36.8 C) (04/15 0437) Temp Source: Oral (04/15 0437) BP: 131/65 (04/15 0437) Pulse Rate: 72 (04/15 0437)  Labs: Recent Labs    10/09/17 1501 10/10/17 0421  HGB 12.5* 12.3*  HCT 36.6* 36.3*  PLT 134* 120*  LABPROT 25.8* 28.1*  INR 2.39 2.66  HEPARINUNFRC  --  0.30  CREATININE 1.26* 1.16    Estimated Creatinine Clearance: 41.8 mL/min (by C-G formula based on SCr of 1.16 mg/dL).   Medical History: Past Medical History:  Diagnosis Date  . Anemia    leakoppenia  . BPH (benign prostatic hypertrophy)   . Bullous pemphigoid    [name], March 2011, right forearm squamous cell carcinoma  . Chronic anticoagulation    systemic  . Colon polyp    transverse, 2002  . History of peptic ulcer    remote, 3/95  . Hx of actinic keratosis   . Hx of basal cell carcinoma   . Hx of squamous cell carcinoma of skin   . Hyperlipidemia   . Left inguinal hernia   . Moderate aortic insufficiency 2009   audible aortic insufficiency on 1/09 echo  . PAF (paroxysmal atrial fibrillation) ([place]) 01/17/2014   On Warfarin.  . S/P mitral valve replacement with metallic valve 16/1096   INR goal 2.5-3.5, St Jude,   . 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 bisoprolol (ZEBETA) 5 MG tablet Take 1 tablet (5 mg total) by mouth daily. 90 tablet 3 10/08/2017 at Unknown time  . COUMADIN 5 MG tablet Take 1 to 1 and 1/2 tablets daily as directed by coumadin clinic 120 tablet 1 10/08/2017 at  Unknown time  . fluticasone (FLONASE) 50 MCG/ACT nasal spray Place 2 sprays into both nostrils as needed for allergies or rhinitis.   Past Month at Unknown time  . furosemide (LASIX) 40 MG tablet Take 1 tablet (40 mg total) by mouth daily and take an additional 0.5 tablet (20 mg total) by mouth 3 days a week. 135 tablet 3 10/09/2017 at Unknown time  . hydrALAZINE (APRESOLINE) 25 MG tablet Take 1 tablet (25 mg total) by mouth daily at lunchtime. Take an additional tablet as needed for systolic blood pressure >045 mmHg 135 tablet 3 10/08/2017 at Unknown time  . isosorbide mononitrate (IMDUR) 30 MG 24 hr tablet TAKE 1 TABLET DAILY (Patient taking differently: TAKE 1 TABLET (30 mg) DAILY) 90 tablet 0 10/08/2017 at Unknown time  . ketoconazole (NIZORAL) 2 % shampoo Apply 1 application topically 2 (two) times a week.   Past Week at Unknown time  . LORazepam (ATIVAN) 1 MG tablet Take 1 mg by mouth every 8 (eight) hours as needed for anxiety.    10/08/2017 at Unknown time  . Multiple Vitamins-Minerals (MULTIVITAMIN PO) Take 1 tablet by mouth daily.   10/09/2017 at Unknown time  . warfarin (COUMADIN) 5 MG tablet Take 1 to 1.5 tablets by mouth daily as directed (Patient not taking: Reported on 10/09/2017) 120 tablet 1 Not Taking at Unknown time  Scheduled:  . acetaminophen  1,000 mg Oral Q8H  . bisoprolol  5 mg Oral Daily  . hydrALAZINE  25 mg Oral Q lunch  . isosorbide mononitrate  30 mg Oral Daily  . mupirocin ointment  1 application Nasal BID  . polyethylene glycol  17 g Oral BID  . senna  1 tablet Oral BID   Infusions:  . heparin 1,100 Units/hr (10/10/17 0537)  . lactated ringers      Assessment: 76 yoM [facility] ED on [facility]14 with right hip fracture.  PMH is significant for chronic warfarin anticoagulation for afib and mechanical mitral valve replacement.  Vit K given to reverse INR prior to planned surgery.  Pharmacy is consulted to dose Heparin perioperatively.  PTA warfarin 5mg  daily except 7.5  mg on Sun/Wed/Fridays.  Last dose taken on 10/08/17. INR on admission is subtherapeutic at 2.39 (goal INR 2.5-3.5)  Today, 10/10/2017  INR in therapeutic range (2.66)  First heparin level at low-end of therapeutic range (0.3) on heparin 1000 units/hr; repeat level also therapeutic (0.37)   CBC: Hgb 12.3, Plts low at 120k but unchanged from 134k prior to starting heparin  No bleeding or complications reported  Plan noted for THA Wednesday    Goal of Therapy:  Heparin level 0.3-0.7 units/ml Monitor platelets by anticoagulation protocol: Yes   Plan:   Continue heparin drip at current rate  Daily heparin level and CBC  Monitor for signs/symptoms of bleeding or thrombosis  Ortho to advise when to hold heparin prior to surgery  Peggyann Juba, PharmD, BCPS Pager: [phone] 10/10/2017,10:37 AM

## 2017-10-10 NOTE — Progress Notes (Signed)
ANTICOAGULATION CONSULT NOTE - Follow Up Consult  Pharmacy Consult for Heparin Indication: atrial fibrillation, mechanical mitral valve (PTA warfarin held)   Allergies  Allergen Reactions  . Flexeril [Cyclobenzaprine] Diarrhea    Patient Measurements: Height: 6' (182.9 cm) Weight: 154 lb 1.6 oz (69.9 kg) IBW/kg (Calculated) : 77.6 Heparin Dosing Weight:   Vital Signs: Temp: 98.3 F (36.8 C) (04/15 0437) Temp Source: Oral (04/15 0437) BP: 131/65 (04/15 0437) Pulse Rate: 72 (04/15 0437)  Labs: Recent Labs    10/09/17 1501 10/10/17 0421  HGB 12.5* 12.3*  HCT 36.6* 36.3*  PLT 134* 120*  LABPROT 25.8* 28.1*  INR 2.39 2.66  HEPARINUNFRC  --  0.30  CREATININE 1.26* 1.16    Estimated Creatinine Clearance: 41.8 mL/min (by C-G formula based on SCr of 1.16 mg/dL).   Medications:  Infusions:  . heparin 1,000 Units/hr (10/09/17 2013)  . lactated ringers      Assessment: Patient with heparin level at lowest level of goal.  No heparin issues per RN.  Goal of Therapy:  Heparin level 0.3-0.7 units/ml Monitor platelets by anticoagulation protocol: Yes   Plan:  Increase heparin to 1100 units/hr--to keep within goal range Recheck level at [address] [place] 10/10/2017,5:39 AM

## 2017-10-10 NOTE — Progress Notes (Addendum)
RT note : charted on wrong pt.

## 2017-10-10 NOTE — Progress Notes (Signed)
Subjective: Patient complains of right hip pain. Had mechanical fall yesterday sustaining right femoral neck fracture   Objective: Vital signs in last 24 hours: Temp:  [97.7 F (36.5 C)-98.3 F (36.8 C)] 98.3 F (36.8 C) (04/15 0437) Pulse Rate:  [72-94] 72 (04/15 0437) Resp:  [15-19] 18 (04/15 0437) BP: (131-178)/(65-90) 131/65 (04/15 0437) SpO2:  [90 %-96 %] 94 % (04/15 0437) Weight:  [154 lb 1.6 oz (69.9 kg)-159 lb 13.3 oz (72.5 kg)] 154 lb 1.6 oz (69.9 kg) (04/15 0433)  Intake/Output from previous day: 04/14 0701 - 04/15 0700 In: 98.2 [I.V.:98.2] Out: 700 [Urine:700] Intake/Output this shift: Total I/O In: 98.2 [I.V.:98.2] Out: 700 [Urine:700]  Recent Labs    10/09/17 1501 10/10/17 0421  HGB 12.5* 12.3*   Recent Labs    10/09/17 1501 10/10/17 0421  WBC 3.7* 4.3  RBC 3.72* 3.68*  HCT 36.6* 36.3*  PLT 134* 120*   Recent Labs    10/09/17 1501 10/10/17 0421  NA 135 132*  K 4.5 4.5  CL 99* 99*  CO2 23 26  BUN 26* 23*  CREATININE 1.26* 1.16  GLUCOSE 111* 123*  CALCIUM 9.0 8.6*   Recent Labs    10/09/17 1501 10/10/17 0421  INR 2.39 2.66    Neurologically intact Neurovascular intact Intact pulses distally Dorsiflexion/Plantar flexion intact Right lowere extremity shortened and externally rotated   Assessment/Plan: Right femoral neck fracture- Will require Total Hip Arthroplasty given displaced fracture in the setting of pre-existing arthritis. His INR is 2.66 today thus we will not do surgery today. Would hold off on any acute correction of the INR. Logistically the next time to do this will be Wednesday afternoon , thus will plan on doing it then. Discussed with patient and he agrees with the plan   Gaynelle Arabian 10/10/2017, 6:57 AM

## 2017-10-10 NOTE — Progress Notes (Signed)
PROGRESS NOTE    Richard Spears  HBZ:[id number] DOB: 11-07-1925 DOA: 10/09/2017 PCP: [name], MD     Brief Narrative:  Richard Spears is a 84 y.o. male with medical history significant of anemia, BPH, A. fib, mechanical mitral valve on warfarin, L hip replacement and several other medical problems who presents after mechanical fall with a right hip fracture. He notes that he was walking to the dining hall.  He slipped on mud on the path and fell onto the grass hitting his right hip.  He notes that he did not not hit any other part of his body.  He did not hit his head.  He did not have any loss of consciousness.  He notes a dull pain to his hip which is sharp with movement.  Patient was admitted for right hip fracture, orthopedic surgery consulted.  Assessment & Plan:   Principal Problem:   Hip fracture ([place]) Active Problems:   S/P MVR (mitral valve replacement)   Paroxysmal atrial fibrillation (HCC)   Essential hypertension   Right femoral neck fracture -Orthopedic surgery consulted -Received vit K, hold coumadin, continue heparin gtt -Pain control  -Planning for OR fixation on Wednesday 4/17 -Will need PT OT post-op   Mechanical mitral valve on warfarin -INR goal 2.5-3.5  -Received vit K, hold coumadin, continue heparin gtt  Paroxysmal atrial fibrillation -Continue bisoprolol -Hold coumadin as above   -NSR this morning  HTN -Continue isosorbide and hydralazine, bisoprolol   Anxiety -Continue ativan prn    DVT prophylaxis: Heparin gtt, holding coumadin pre-op Code Status: Full Family Communication: No family at bedside Disposition Plan: Pending operation scheduled 4/17   Consultants:   Orthopedic surgery  Procedures:   None   Antimicrobials:  Anti-infectives (From admission, onward)   None       Subjective: Patient feeling well today.  Has pain with movement but otherwise well controlled.  Denies any chest pain or shortness of breath,  nausea, vomiting or abdominal pain today.  Objective: Vitals:   10/09/17 1756 10/09/17 1911 10/10/17 0433 10/10/17 0437  BP: (!) 168/84 (!) 157/84  131/65  Pulse: 91 94  72  Resp: 18 16  18   Temp:  98.3 F (36.8 C)  98.3 F (36.8 C)  TempSrc:  Oral  Oral  SpO2: 94% 90%  94%  Weight:   69.9 kg (154 lb 1.6 oz)   Height:        Intake/Output Summary (Last 24 hours) at 10/10/2017 1043 Last data filed at 10/10/2017 0943 Gross per 24 hour  Intake 338.21 ml  Output 1000 ml  Net -661.79 ml   Filed Weights   10/09/17 1403 10/10/17 0433  Weight: 72.5 kg (159 lb 13.3 oz) 69.9 kg (154 lb 1.6 oz)    Examination:  General exam: Appears calm and comfortable  Respiratory system: Clear to auscultation. Respiratory effort normal. Cardiovascular system: S1 & S2 heard, RRR. +click  Gastrointestinal system: Abdomen is nondistended, soft and nontender. No organomegaly or masses felt. Normal bowel sounds heard. Central nervous system: Alert and oriented. No focal neurological deficits. Extremities: Symmetric in appearance  Skin: No rashes, lesions or ulcers Psychiatry: Judgement and insight appear normal. Mood & affect appropriate.   Data Reviewed: I have personally reviewed following labs and imaging studies  CBC: Recent Labs  Lab 10/09/17 1501 10/10/17 0421  WBC 3.7* 4.3  NEUTROABS 3.0  --   HGB 12.5* 12.3*  HCT 36.6* 36.3*  MCV 98.4 98.6  PLT 134* 120*  Basic Metabolic Panel: Recent Labs  Lab 10/09/17 1501 10/09/17 2030 10/10/17 0421  NA 135  --  132*  K 4.5  --  4.5  CL 99*  --  99*  CO2 23  --  26  GLUCOSE 111*  --  123*  BUN 26*  --  23*  CREATININE 1.26*  --  1.16  CALCIUM 9.0  --  8.6*  MG  --  2.3 2.1   GFR: Estimated Creatinine Clearance: 41.8 mL/min (by C-G formula based on SCr of 1.16 mg/dL). Liver Function Tests: Recent Labs  Lab 10/09/17 1501 10/10/17 0421  AST 39 36  ALT 21 23  ALKPHOS 59 49  BILITOT 1.3* 1.4*  PROT 7.1 6.2*  ALBUMIN 4.2 3.5    No results for input(s): LIPASE, AMYLASE in the last 168 hours. No results for input(s): AMMONIA in the last 168 hours. Coagulation Profile: Recent Labs  Lab 10/09/17 1501 10/10/17 0421  INR 2.39 2.66   Cardiac Enzymes: No results for input(s): CKTOTAL, CKMB, CKMBINDEX, TROPONINI in the last 168 hours. BNP (last 3 results) No results for input(s): PROBNP in the last 8760 hours. HbA1C: No results for input(s): HGBA1C in the last 72 hours. CBG: No results for input(s): GLUCAP in the last 168 hours. Lipid Profile: No results for input(s): CHOL, HDL, LDLCALC, TRIG, CHOLHDL, LDLDIRECT in the last 72 hours. Thyroid Function Tests: No results for input(s): TSH, T4TOTAL, FREET4, T3FREE, THYROIDAB in the last 72 hours. Anemia Panel: No results for input(s): VITAMINB12, FOLATE, FERRITIN, TIBC, IRON, RETICCTPCT in the last 72 hours. Sepsis Labs: No results for input(s): PROCALCITON, LATICACIDVEN in the last 168 hours.  Recent Results (from the past 240 hour(s))  Surgical PCR screen     Status: None   Collection Time: 10/09/17  8:08 PM  Result Value Ref Range Status   MRSA, PCR NEGATIVE NEGATIVE Final   Staphylococcus aureus NEGATIVE NEGATIVE Final    Comment: (NOTE) The Xpert SA Assay (FDA approved for NASAL specimens in patients 70 years of age and older), is one component of a comprehensive surveillance program. It is not intended to diagnose infection nor to guide or monitor treatment. Performed at [facility], [place] [address], [place], [place] [address]        Radiology Studies: Dg Chest 1 View  Result Date: 10/09/2017 CLINICAL DATA:  Pain following fall EXAM: CHEST  1 VIEW COMPARISON:  April 22, 2010 FINDINGS: There is no appreciable edema or consolidation. Heart is upper normal in size with pulmonary vascularity within normal limits. Patient is status post mitral valve replacement. There is aortic atherosclerosis. No adenopathy. No bone lesions. No  pneumothorax. IMPRESSION: No edema or consolidation. No pneumothorax. Heart upper normal in size. Status post mitral valve replacement. Aortic atherosclerosis present. Aortic Atherosclerosis (ICD10-I70.0). Electronically Signed   By: Lowella Grip III M.D.   On: 10/09/2017 15:12   Dg Hip Unilat With Pelvis 2-3 Views Right  Result Date: 10/09/2017 CLINICAL DATA:  Pain following fall EXAM: DG HIP (WITH OR WITHOUT PELVIS) 2-3V RIGHT COMPARISON:  None. FINDINGS: Frontal pelvis as well as frontal and lateral right hip images were obtained. There is a subcapital femoral neck fracture on the right with varus angulation at the fracture site. There is a total hip replacement left with prosthetic components well-seated. No other fracture. No dislocation. There is moderate narrowing of the right hip joint. IMPRESSION: Subcapital femoral neck fracture on the right with varus angulation at the fracture site. Total hip  replacement on the left with prosthetic components appearing well-seated. No dislocation. Moderate narrowing right hip joint. Electronically Signed   By: Lowella Grip III M.D.   On: 10/09/2017 15:11      Scheduled Meds: . acetaminophen  1,000 mg Oral Q8H  . bisoprolol  5 mg Oral Daily  . hydrALAZINE  25 mg Oral Q lunch  . isosorbide mononitrate  30 mg Oral Daily  . mupirocin ointment  1 application Nasal BID  . polyethylene glycol  17 g Oral BID  . senna  1 tablet Oral BID   Continuous Infusions: . heparin 1,100 Units/hr (10/10/17 0537)     LOS: 1 day    Time spent: 25 minutes   Dessa Phi, DO [facility] [email or web address] Password [facility] 10/10/2017, 10:43 AM

## 2017-10-11 ENCOUNTER — Encounter (HOSPITAL_COMMUNITY): Payer: Self-pay

## 2017-10-11 LAB — CBC
HCT: 36.3 % — ABNORMAL LOW (ref 39.0–52.0)
Hemoglobin: 12.2 g/dL — ABNORMAL LOW (ref 13.0–17.0)
MCH: 33.5 pg (ref 26.0–34.0)
MCHC: 33.6 g/dL (ref 30.0–36.0)
MCV: 99.7 fL (ref 78.0–100.0)
PLATELETS: 112 10*3/uL — AB (ref 150–400)
RBC: 3.64 MIL/uL — AB (ref 4.22–5.81)
RDW: 14.7 % (ref 11.5–15.5)
WBC: 3.5 10*3/uL — ABNORMAL LOW (ref 4.0–10.5)

## 2017-10-11 LAB — BASIC METABOLIC PANEL
Anion gap: 7 (ref 5–15)
BUN: 21 mg/dL — ABNORMAL HIGH (ref 6–20)
CALCIUM: 8.6 mg/dL — AB (ref 8.9–10.3)
CO2: 24 mmol/L (ref 22–32)
CREATININE: 1.09 mg/dL (ref 0.61–1.24)
Chloride: 102 mmol/L (ref 101–111)
GFR calc Af Amer: >60 mL/min (ref >60)
GFR calc non Af Amer: 58 mL/min — ABNORMAL LOW (ref >60)
GLUCOSE: 89 mg/dL (ref 65–99)
Potassium: 4.3 mmol/L (ref 3.5–5.1)
Sodium: 133 mmol/L — ABNORMAL LOW (ref 135–145)

## 2017-10-11 LAB — PROTIME-INR
INR: 2.18
PROTHROMBIN TIME: 24.1 s — AB (ref 11.4–15.2)

## 2017-10-11 LAB — HEPARIN LEVEL (UNFRACTIONATED): HEPARIN UNFRACTIONATED: 0.37 [IU]/mL (ref 0.30–0.70)

## 2017-10-11 MED ORDER — CEFAZOLIN (ANCEF) 1 G IV SOLR: 1.0000 g | INTRAVENOUS | Status: DC

## 2017-10-11 MED ORDER — CEFAZOLIN SODIUM-DEXTROSE 2-4 GM/100ML-% IV SOLN
2.0000 g | INTRAVENOUS | Status: AC
  Filled 2017-10-11 (×2): qty 100

## 2017-10-11 NOTE — Progress Notes (Signed)
ANTICOAGULATION CONSULT NOTE - Follow Up Consult  Pharmacy Consult for Heparin Indication: atrial fibrillation, mechanical mitral valve (PTA warfarin held)  Allergies  Allergen Reactions  . Flexeril [Cyclobenzaprine] Diarrhea    Patient Measurements: Height: 6' (182.9 cm) Weight: 154 lb 12.2 oz (70.2 kg) IBW/kg (Calculated) : 77.6 Heparin Dosing Weight: TBW  Vital Signs: Temp: 98.8 F (37.1 C) (04/16 0534) Temp Source: Oral (04/16 0534) BP: 154/88 (04/16 0624) Pulse Rate: 79 (04/16 0624)  Labs: Recent Labs    10/09/17 1501 10/10/17 0421 10/10/17 1348 10/11/17 0503  HGB 12.5* 12.3*  --  12.2*  HCT 36.6* 36.3*  --  36.3*  PLT 134* 120*  --  112*  LABPROT 25.8* 28.1*  --  24.1*  INR 2.39 2.66  --  2.18  HEPARINUNFRC  --  0.30 0.37 0.37  CREATININE 1.26* 1.16  --  1.09    Estimated Creatinine Clearance: 44.7 mL/min (by C-G formula based on SCr of 1.09 mg/dL).   Medical History: Past Medical History:  Diagnosis Date  . Anemia    leakoppenia  . BPH (benign prostatic hypertrophy)   . Bullous pemphigoid    [name], March 2011, right forearm squamous cell carcinoma  . Chronic anticoagulation    systemic  . Colon polyp    transverse, 2002  . History of peptic ulcer    remote, 3/95  . Hx of actinic keratosis   . Hx of basal cell carcinoma   . Hx of squamous cell carcinoma of skin   . Hyperlipidemia   . Left inguinal hernia   . Moderate aortic insufficiency 2009   audible aortic insufficiency on 1/09 echo  . PAF (paroxysmal atrial fibrillation) ([place]) 01/17/2014   On Warfarin.  . S/P mitral valve replacement with metallic valve 94/1740   INR goal 2.5-3.5, St Jude,   . 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 bisoprolol (ZEBETA) 5 MG tablet Take 1 tablet (5 mg total) by mouth daily. 90 tablet 3 10/08/2017 at Unknown time  .  COUMADIN 5 MG tablet Take 1 to 1 and 1/2 tablets daily as directed by coumadin clinic 120 tablet 1 10/08/2017 at Unknown time  . fluticasone (FLONASE) 50 MCG/ACT nasal spray Place 2 sprays into both nostrils as needed for allergies or rhinitis.   Past Month at Unknown time  . furosemide (LASIX) 40 MG tablet Take 1 tablet (40 mg total) by mouth daily and take an additional 0.5 tablet (20 mg total) by mouth 3 days a week. 135 tablet 3 10/09/2017 at Unknown time  . hydrALAZINE (APRESOLINE) 25 MG tablet Take 1 tablet (25 mg total) by mouth daily at lunchtime. Take an additional tablet as needed for systolic blood pressure >814 mmHg 135 tablet 3 10/08/2017 at Unknown time  . isosorbide mononitrate (IMDUR) 30 MG 24 hr tablet TAKE 1 TABLET DAILY (Patient taking differently: TAKE 1 TABLET (30 mg) DAILY) 90 tablet 0 10/08/2017 at Unknown time  . ketoconazole (NIZORAL) 2 % shampoo Apply 1 application topically 2 (two) times a week.   Past Week at Unknown time  . LORazepam (ATIVAN) 1 MG tablet Take 1 mg by mouth every 8 (eight) hours as needed for anxiety.    10/08/2017 at Unknown time  . Multiple Vitamins-Minerals (MULTIVITAMIN PO) Take 1 tablet by mouth daily.   10/09/2017 at Unknown time  . warfarin (  COUMADIN) 5 MG tablet Take 1 to 1.5 tablets by mouth daily as directed (Patient not taking: Reported on 10/09/2017) 120 tablet 1 Not Taking at Unknown time   Scheduled:  . acetaminophen  1,000 mg Oral Q8H  . bisoprolol  5 mg Oral Daily  . hydrALAZINE  25 mg Oral Q lunch  . isosorbide mononitrate  30 mg Oral Daily  . polyethylene glycol  17 g Oral BID  . senna  1 tablet Oral BID   Infusions:  . heparin 1,100 Units/hr (10/10/17 1910)    Assessment: 89 yoM [facility] ED on [facility]/14 with right hip fracture.  PMH is significant for chronic warfarin anticoagulation for afib and mechanical mitral valve replacement.  Vit K given to reverse INR prior to planned surgery.  Pharmacy is consulted to dose Heparin  perioperatively.  PTA warfarin 5mg  daily except 7.5 mg on Sun/Wed/Fridays.  Last dose taken on 10/08/17. INR on admission is subtherapeutic at 2.39 (goal INR 2.5-3.5)  Today, 10/11/2017  INR subtherapeutic and decreasing (2.18)  Heparin level in therapeutic range (0.37) on heparin 1100 units/hr   CBC: Hgb 12.2, Plts low and decreasing 112k, 134k prior to starting heparin  No bleeding or complications reported  Plan noted for THA Wednesday   Goal of Therapy:  Heparin level 0.3-0.7 units/ml Monitor platelets by anticoagulation protocol: Yes   Plan:   Continue heparin drip at current rate  Daily heparin level and CBC  Monitor for signs/symptoms of bleeding or thrombosis  Ortho to advise when to hold heparin prior to surgery  Peggyann Juba, PharmD, BCPS Pager: [phone] 10/11/2017,6:57 AM

## 2017-10-11 NOTE — Progress Notes (Signed)
   Subjective: Hospital day - 2 Patient reports pain as mild.   Patient seen in rounds with Dr. Wynelle Link. Patient is well, but has had some minor complaints of pain in the hip, requiring pain medications Plan for surgery tomorrow afternoon.  Will allow for clear liquids until about 8 am tomorrow and then NPO.   Stop Heparin at 6 AM tomorrow morning.  Objective: Vital signs in last 24 hours: Temp:  [98 F (36.7 C)-98.8 F (37.1 C)] 98.2 F (36.8 C) (04/16 1255) Pulse Rate:  [73-79] 73 (04/16 1255) Resp:  [14-18] 18 (04/16 1255) BP: (104-186)/(53-88) 104/53 (04/16 1255) SpO2:  [93 %-95 %] 95 % (04/16 1255) Weight:  [70.2 kg (154 lb 12.2 oz)] 70.2 kg (154 lb 12.2 oz) (04/16 0534)  Intake/Output from previous day:  Intake/Output Summary (Last 24 hours) at 10/11/2017 1327 Last data filed at 10/11/2017 1300 Gross per 24 hour  Intake 1464 ml  Output 1100 ml  Net 364 ml    Intake/Output this shift: Total I/O In: 480 [P.O.:480] Out: 200 [Urine:200]  Labs: Recent Labs    10/09/17 1501 10/10/17 0421 10/11/17 0503  HGB 12.5* 12.3* 12.2*   Recent Labs    10/10/17 0421 10/11/17 0503  WBC 4.3 3.5*  RBC 3.68* 3.64*  HCT 36.3* 36.3*  PLT 120* 112*   Recent Labs    10/10/17 0421 10/11/17 0503  NA 132* 133*  K 4.5 4.3  CL 99* 102  CO2 26 24  BUN 23* 21*  CREATININE 1.16 1.09  GLUCOSE 123* 89  CALCIUM 8.6* 8.6*   Recent Labs    10/10/17 0421 10/11/17 0503  INR 2.66 2.18    EXAM General - Patient is Alert and Appropriate Extremity - Neurovascular intact Sensation intact distally Intact pulses distally Dorsiflexion/Plantar flexion intact Motor Function - intact, moving foot and toes well on exam. Right leg is shortened and externally rotated  Past Medical History:  Diagnosis Date  . Anemia    leakoppenia  . BPH (benign prostatic hypertrophy)   . Bullous pemphigoid    [name], March 2011, right forearm squamous cell carcinoma  . Chronic anticoagulation      systemic  . Colon polyp    transverse, 2002  . History of peptic ulcer    remote, 3/95  . Hx of actinic keratosis   . Hx of basal cell carcinoma   . Hx of squamous cell carcinoma of skin   . Hyperlipidemia   . Left inguinal hernia   . Moderate aortic insufficiency 2009   audible aortic insufficiency on 1/09 echo  . PAF (paroxysmal atrial fibrillation) ([place]) 01/17/2014   On Warfarin.  . S/P mitral valve replacement with metallic valve 41/2878   INR goal 2.5-3.5, St Jude,   . 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   Weight as of this encounter: 70.2 kg (154 lb 12.2 oz).   Preop for surgery tomorrow afternoon. Clear liquids in the AM NPO at 8 am tomorrow after clear liquids. Consent added.  Arlee Muslim, PA-C Orthopaedic Surgery [phone] 10/11/2017, 1:27 PM

## 2017-10-11 NOTE — Progress Notes (Signed)
PROGRESS NOTE    Richard Spears  WCB:[id number] DOB: 1926/02/05 DOA: 10/09/2017 PCP: [name], MD     Brief Narrative:  Richard Spears is a 84 y.o. male with medical history significant of anemia, BPH, A. fib, mechanical mitral valve on warfarin, L hip replacement and several other medical problems who presents after mechanical fall with a right hip fracture. He notes that he was walking to the dining hall.  He slipped on mud on the path and fell onto the grass hitting his right hip.  He notes that he did not not hit any other part of his body.  He did not hit his head.  He did not have any loss of consciousness.  He notes a dull pain to his hip which is sharp with movement.  Patient was admitted for right hip fracture, orthopedic surgery consulted. He received vit K to reverse INR and planned for OR 4/17.   Assessment & Plan:   Principal Problem:   Hip fracture ([place]) Active Problems:   S/P MVR (mitral valve replacement)   Paroxysmal atrial fibrillation (HCC)   Essential hypertension   Right femoral neck fracture -Orthopedic surgery consulted -Received vit K, hold coumadin, continue heparin gtt -Pain control  -Planning for OR fixation on Wednesday 4/17 -Will need PT OT post-op   Mechanical mitral valve on warfarin -INR goal 2.5-3.5  -Received vit K, hold coumadin, continue heparin gtt  Paroxysmal atrial fibrillation -Continue bisoprolol -Hold coumadin as above , IV heparin gtt   HTN -Continue isosorbide and hydralazine, bisoprolol   Anxiety -Continue ativan prn    DVT prophylaxis: Heparin gtt, holding coumadin pre-op Code Status: Full Family Communication: No family at bedside Disposition Plan: Pending operation scheduled 4/17   Consultants:   Orthopedic surgery  Procedures:   None   Antimicrobials:  Anti-infectives (From admission, onward)   None       Subjective: No acute events overnight, feeling well overall.  Pain is well controlled  as long as he is not moving.  No complaints.  Objective: Vitals:   10/10/17 2130 10/11/17 0534 10/11/17 0534 10/11/17 0624  BP: (!) 115/54  (!) 186/83 (!) 154/88  Pulse: 76  78 79  Resp: 14  16   Temp: 98.8 F (37.1 C)  98.8 F (37.1 C)   TempSrc: Oral  Oral   SpO2: 93%  93%   Weight:  70.2 kg (154 lb 12.2 oz)    Height:        Intake/Output Summary (Last 24 hours) at 10/11/2017 1008 Last data filed at 10/11/2017 0924 Gross per 24 hour  Intake 1224 ml  Output 900 ml  Net 324 ml   Filed Weights   10/09/17 1403 10/10/17 0433 10/11/17 0534  Weight: 72.5 kg (159 lb 13.3 oz) 69.9 kg (154 lb 1.6 oz) 70.2 kg (154 lb 12.2 oz)    Examination: General exam: Appears calm and comfortable  Respiratory system: Clear to auscultation. Respiratory effort normal. Cardiovascular system: S1 & S2 heard, RRR. +click.  Gastrointestinal system: Abdomen is nondistended, soft and nontender. No organomegaly or masses felt. Normal bowel sounds heard. Central nervous system: Alert and oriented. No focal neurological deficits. Extremities: Symmetric 5 x 5 power. Skin: No rashes, lesions or ulcers Psychiatry: Judgement and insight appear normal. Mood & affect appropriate.    Data Reviewed: I have personally reviewed following labs and imaging studies  CBC: Recent Labs  Lab 10/09/17 1501 10/10/17 0421 10/11/17 0503  WBC 3.7* 4.3 3.5*  NEUTROABS  3.0  --   --   HGB 12.5* 12.3* 12.2*  HCT 36.6* 36.3* 36.3*  MCV 98.4 98.6 99.7  PLT 134* 120* 532*   Basic Metabolic Panel: Recent Labs  Lab 10/09/17 1501 10/09/17 2030 10/10/17 0421 10/11/17 0503  NA 135  --  132* 133*  K 4.5  --  4.5 4.3  CL 99*  --  99* 102  CO2 23  --  26 24  GLUCOSE 111*  --  123* 89  BUN 26*  --  23* 21*  CREATININE 1.26*  --  1.16 1.09  CALCIUM 9.0  --  8.6* 8.6*  MG  --  2.3 2.1  --    GFR: Estimated Creatinine Clearance: 44.7 mL/min (by C-G formula based on SCr of 1.09 mg/dL). Liver Function Tests: Recent  Labs  Lab 10/09/17 1501 10/10/17 0421  AST 39 36  ALT 21 23  ALKPHOS 59 49  BILITOT 1.3* 1.4*  PROT 7.1 6.2*  ALBUMIN 4.2 3.5   No results for input(s): LIPASE, AMYLASE in the last 168 hours. No results for input(s): AMMONIA in the last 168 hours. Coagulation Profile: Recent Labs  Lab 10/09/17 1501 10/10/17 0421 10/11/17 0503  INR 2.39 2.66 2.18   Cardiac Enzymes: No results for input(s): CKTOTAL, CKMB, CKMBINDEX, TROPONINI in the last 168 hours. BNP (last 3 results) No results for input(s): PROBNP in the last 8760 hours. HbA1C: No results for input(s): HGBA1C in the last 72 hours. CBG: No results for input(s): GLUCAP in the last 168 hours. Lipid Profile: No results for input(s): CHOL, HDL, LDLCALC, TRIG, CHOLHDL, LDLDIRECT in the last 72 hours. Thyroid Function Tests: No results for input(s): TSH, T4TOTAL, FREET4, T3FREE, THYROIDAB in the last 72 hours. Anemia Panel: No results for input(s): VITAMINB12, FOLATE, FERRITIN, TIBC, IRON, RETICCTPCT in the last 72 hours. Sepsis Labs: No results for input(s): PROCALCITON, LATICACIDVEN in the last 168 hours.  Recent Results (from the past 240 hour(s))  Surgical PCR screen     Status: None   Collection Time: 10/09/17  8:08 PM  Result Value Ref Range Status   MRSA, PCR NEGATIVE NEGATIVE Final   Staphylococcus aureus NEGATIVE NEGATIVE Final    Comment: (NOTE) The Xpert SA Assay (FDA approved for NASAL specimens in patients 23 years of age and older), is one component of a comprehensive surveillance program. It is not intended to diagnose infection nor to guide or monitor treatment. Performed at [facility], [place] [address], [place], [place] [address]        Radiology Studies: Dg Chest 1 View  Result Date: 10/09/2017 CLINICAL DATA:  Pain following fall EXAM: CHEST  1 VIEW COMPARISON:  April 22, 2010 FINDINGS: There is no appreciable edema or consolidation. Heart is upper normal in size with  pulmonary vascularity within normal limits. Patient is status post mitral valve replacement. There is aortic atherosclerosis. No adenopathy. No bone lesions. No pneumothorax. IMPRESSION: No edema or consolidation. No pneumothorax. Heart upper normal in size. Status post mitral valve replacement. Aortic atherosclerosis present. Aortic Atherosclerosis (ICD10-I70.0). Electronically Signed   By: Lowella Grip III M.D.   On: 10/09/2017 15:12   Dg Hip Unilat With Pelvis 2-3 Views Right  Result Date: 10/09/2017 CLINICAL DATA:  Pain following fall EXAM: DG HIP (WITH OR WITHOUT PELVIS) 2-3V RIGHT COMPARISON:  None. FINDINGS: Frontal pelvis as well as frontal and lateral right hip images were obtained. There is a subcapital femoral neck fracture on the right with varus angulation at  the fracture site. There is a total hip replacement left with prosthetic components well-seated. No other fracture. No dislocation. There is moderate narrowing of the right hip joint. IMPRESSION: Subcapital femoral neck fracture on the right with varus angulation at the fracture site. Total hip replacement on the left with prosthetic components appearing well-seated. No dislocation. Moderate narrowing right hip joint. Electronically Signed   By: Lowella Grip III M.D.   On: 10/09/2017 15:11      Scheduled Meds: . acetaminophen  1,000 mg Oral Q8H  . bisoprolol  5 mg Oral Daily  . hydrALAZINE  25 mg Oral Q lunch  . isosorbide mononitrate  30 mg Oral Daily  . polyethylene glycol  17 g Oral BID  . senna  1 tablet Oral BID   Continuous Infusions: . heparin 1,100 Units/hr (10/10/17 1910)     LOS: 2 days    Time spent: 20 minutes   Dessa Phi, DO [facility] [email or web address] Password [email or web address] 10/11/2017, 10:08 AM

## 2017-10-12 ENCOUNTER — Encounter (HOSPITAL_COMMUNITY): Payer: Self-pay

## 2017-10-12 ENCOUNTER — Inpatient Hospital Stay (HOSPITAL_COMMUNITY): Payer: Medicare Other | Admitting: Anesthesiology

## 2017-10-12 ENCOUNTER — Encounter (HOSPITAL_COMMUNITY)
Admission: EM | Disposition: A | Discharge status: Skilled Nursing Facility | Payer: Self-pay | Source: Home / Self Care | Attending: Family Medicine

## 2017-10-12 ENCOUNTER — Inpatient Hospital Stay (HOSPITAL_COMMUNITY): Payer: Medicare Other

## 2017-10-12 DIAGNOSIS — S72001A Fracture of unspecified part of neck of right femur, initial encounter for closed fracture: Secondary | ICD-10-CM

## 2017-10-12 DIAGNOSIS — I10 Essential (primary) hypertension: Secondary | ICD-10-CM

## 2017-10-12 DIAGNOSIS — I480 Paroxysmal atrial fibrillation: Secondary | ICD-10-CM

## 2017-10-12 HISTORY — PX: TOTAL HIP ARTHROPLASTY: SHX124

## 2017-10-12 LAB — CBC
HEMATOCRIT: 36.8 % — AB (ref 39.0–52.0)
Hemoglobin: 12.6 g/dL — ABNORMAL LOW (ref 13.0–17.0)
MCH: 33.8 pg (ref 26.0–34.0)
MCHC: 34.2 g/dL (ref 30.0–36.0)
MCV: 98.7 fL (ref 78.0–100.0)
Platelets: 107 10*3/uL — ABNORMAL LOW (ref 150–400)
RBC: 3.73 MIL/uL — ABNORMAL LOW (ref 4.22–5.81)
RDW: 14.4 % (ref 11.5–15.5)
WBC: 3.7 10*3/uL — ABNORMAL LOW (ref 4.0–10.5)

## 2017-10-12 LAB — BASIC METABOLIC PANEL
Anion gap: 8 (ref 5–15)
BUN: 17 mg/dL (ref 6–20)
CHLORIDE: 102 mmol/L (ref 101–111)
CO2: 25 mmol/L (ref 22–32)
Calcium: 9 mg/dL (ref 8.9–10.3)
Creatinine, Ser: 1.01 mg/dL (ref 0.61–1.24)
GFR calc Af Amer: >60 mL/min (ref >60)
GFR calc non Af Amer: >60 mL/min (ref >60)
GLUCOSE: 87 mg/dL (ref 65–99)
POTASSIUM: 4.3 mmol/L (ref 3.5–5.1)
Sodium: 135 mmol/L (ref 135–145)

## 2017-10-12 LAB — APTT: APTT: 50 s — AB (ref 24–36)

## 2017-10-12 LAB — PROTIME-INR
INR: 1.72
INR: 1.6
Prothrombin Time: 20 seconds — ABNORMAL HIGH (ref 11.4–15.2)
Prothrombin Time: 19 seconds — ABNORMAL HIGH (ref 11.4–15.2)

## 2017-10-12 LAB — HEPARIN LEVEL (UNFRACTIONATED): Heparin Unfractionated: 0.23 IU/mL — ABNORMAL LOW (ref 0.30–0.70)

## 2017-10-12 SURGERY — ARTHROPLASTY, HIP, TOTAL, ANTERIOR APPROACH
Anesthesia: General | Site: Hip | Laterality: Right

## 2017-10-12 MED ORDER — ACETAMINOPHEN 325 MG PO TABS
325.0000 mg | ORAL_TABLET | Freq: Four times a day (QID) | ORAL | Status: DC | PRN
  Administered 2017-10-14: 650 mg via ORAL
  Filled 2017-10-12 (×2): qty 2

## 2017-10-12 MED ORDER — METOCLOPRAMIDE HCL 5 MG/ML IJ SOLN: 10.0000 mg | Freq: Once | INTRAMUSCULAR | Status: DC | PRN

## 2017-10-12 MED ORDER — CEFAZOLIN SODIUM-DEXTROSE 2-4 GM/100ML-% IV SOLN
2.0000 g | Freq: Once | INTRAVENOUS | Status: AC
  Administered 2017-10-12: 2 g via INTRAVENOUS

## 2017-10-12 MED ORDER — WARFARIN SODIUM 5 MG PO TABS
10.0000 mg | ORAL_TABLET | Freq: Once | ORAL | Status: AC
  Administered 2017-10-12: 10 mg via ORAL
  Filled 2017-10-12: qty 1

## 2017-10-12 MED ORDER — LACTATED RINGERS IV SOLN
INTRAVENOUS | Status: DC
  Administered 2017-10-12 (×2): via INTRAVENOUS

## 2017-10-12 MED ORDER — METHOCARBAMOL 1000 MG/10ML IJ SOLN
500.0000 mg | Freq: Four times a day (QID) | INTRAVENOUS | Status: DC | PRN
  Administered 2017-10-12: 500 mg via INTRAVENOUS
  Filled 2017-10-12: qty 550

## 2017-10-12 MED ORDER — DEXAMETHASONE SODIUM PHOSPHATE 10 MG/ML IJ SOLN
10.0000 mg | Freq: Once | INTRAMUSCULAR | Status: AC
  Administered 2017-10-13: 10 mg via INTRAVENOUS
  Filled 2017-10-12: qty 1

## 2017-10-12 MED ORDER — ENOXAPARIN SODIUM 40 MG/0.4ML ~~LOC~~ SOLN
40.0000 mg | SUBCUTANEOUS | Status: DC
  Administered 2017-10-13 – 2017-10-14 (×2): 40 mg via SUBCUTANEOUS
  Filled 2017-10-12 (×3): qty 0.4

## 2017-10-12 MED ORDER — SUGAMMADEX SODIUM 200 MG/2ML IV SOLN
INTRAVENOUS | Status: DC | PRN
  Administered 2017-10-12: 150 mg via INTRAVENOUS

## 2017-10-12 MED ORDER — METHOCARBAMOL 500 MG PO TABS: 500.0000 mg | ORAL_TABLET | Freq: Four times a day (QID) | ORAL | Status: DC | PRN

## 2017-10-12 MED ORDER — HYDROMORPHONE HCL 1 MG/ML IJ SOLN: 0.5000 mg | INTRAMUSCULAR | Status: DC | PRN

## 2017-10-12 MED ORDER — PHENOL 1.4 % MT LIQD
1.0000 | OROMUCOSAL | Status: DC | PRN
  Filled 2017-10-12: qty 177

## 2017-10-12 MED ORDER — ACETAMINOPHEN 500 MG PO TABS
1000.0000 mg | ORAL_TABLET | Freq: Four times a day (QID) | ORAL | Status: AC
  Administered 2017-10-12 – 2017-10-13 (×4): 1000 mg via ORAL
  Filled 2017-10-12 (×4): qty 2

## 2017-10-12 MED ORDER — TRAMADOL HCL 50 MG PO TABS: 50.0000 mg | ORAL_TABLET | Freq: Four times a day (QID) | ORAL | Status: DC | PRN

## 2017-10-12 MED ORDER — BUPIVACAINE HCL (PF) 0.25 % IJ SOLN
INTRAMUSCULAR | Status: AC
  Filled 2017-10-12: qty 30

## 2017-10-12 MED ORDER — BUPIVACAINE-EPINEPHRINE (PF) 0.25% -1:200000 IJ SOLN
INTRAMUSCULAR | Status: DC | PRN
  Administered 2017-10-12: 30 mL via PERINEURAL

## 2017-10-12 MED ORDER — OXYCODONE HCL 5 MG PO TABS
5.0000 mg | ORAL_TABLET | ORAL | Status: DC | PRN
  Administered 2017-10-12: 10 mg via ORAL
  Filled 2017-10-12: qty 2

## 2017-10-12 MED ORDER — ONDANSETRON HCL 4 MG/2ML IJ SOLN
INTRAMUSCULAR | Status: DC | PRN
  Administered 2017-10-12: 4 mg via INTRAVENOUS

## 2017-10-12 MED ORDER — BISACODYL 10 MG RE SUPP: 10.0000 mg | Freq: Every day | RECTAL | Status: DC | PRN

## 2017-10-12 MED ORDER — ONDANSETRON HCL 4 MG/2ML IJ SOLN: 4.0000 mg | Freq: Four times a day (QID) | INTRAMUSCULAR | Status: DC | PRN

## 2017-10-12 MED ORDER — CEFAZOLIN SODIUM-DEXTROSE 2-4 GM/100ML-% IV SOLN
2.0000 g | Freq: Four times a day (QID) | INTRAVENOUS | Status: AC
  Administered 2017-10-12 – 2017-10-13 (×2): 2 g via INTRAVENOUS
  Filled 2017-10-12 (×2): qty 100

## 2017-10-12 MED ORDER — ROCURONIUM BROMIDE 10 MG/ML (PF) SYRINGE
PREFILLED_SYRINGE | INTRAVENOUS | Status: DC | PRN
  Administered 2017-10-12: 40 mg via INTRAVENOUS

## 2017-10-12 MED ORDER — FENTANYL CITRATE (PF) 100 MCG/2ML IJ SOLN
INTRAMUSCULAR | Status: DC | PRN
  Administered 2017-10-12: 50 ug via INTRAVENOUS
  Administered 2017-10-12 (×3): 25 ug via INTRAVENOUS

## 2017-10-12 MED ORDER — DOCUSATE SODIUM 100 MG PO CAPS
100.0000 mg | ORAL_CAPSULE | Freq: Two times a day (BID) | ORAL | Status: DC
  Administered 2017-10-12 – 2017-10-14 (×4): 100 mg via ORAL
  Filled 2017-10-12 (×4): qty 1

## 2017-10-12 MED ORDER — PROPOFOL 10 MG/ML IV BOLUS
INTRAVENOUS | Status: AC
  Filled 2017-10-12: qty 20

## 2017-10-12 MED ORDER — METOCLOPRAMIDE HCL 5 MG/ML IJ SOLN: 5.0000 mg | Freq: Three times a day (TID) | INTRAMUSCULAR | Status: DC | PRN

## 2017-10-12 MED ORDER — MEPERIDINE HCL 50 MG/ML IJ SOLN: 6.2500 mg | INTRAMUSCULAR | Status: DC | PRN

## 2017-10-12 MED ORDER — SODIUM CHLORIDE 0.9 % IV SOLN
INTRAVENOUS | Status: DC
  Administered 2017-10-12 – 2017-10-14 (×5): via INTRAVENOUS

## 2017-10-12 MED ORDER — FLEET ENEMA 7-19 GM/118ML RE ENEM: 1.0000 | ENEMA | Freq: Once | RECTAL | Status: DC | PRN

## 2017-10-12 MED ORDER — POLYETHYLENE GLYCOL 3350 17 G PO PACK: 17.0000 g | PACK | Freq: Every day | ORAL | Status: DC | PRN

## 2017-10-12 MED ORDER — WARFARIN - PHARMACIST DOSING INPATIENT: Freq: Every day | Status: DC

## 2017-10-12 MED ORDER — PHENYLEPHRINE HCL 10 MG/ML IJ SOLN
INTRAVENOUS | Status: DC | PRN
  Administered 2017-10-12: 80 ug/min via INTRAVENOUS

## 2017-10-12 MED ORDER — LIDOCAINE 2% (20 MG/ML) 5 ML SYRINGE
INTRAMUSCULAR | Status: DC | PRN
  Administered 2017-10-12: 50 mg via INTRAVENOUS

## 2017-10-12 MED ORDER — DIPHENHYDRAMINE HCL 12.5 MG/5ML PO ELIX: 12.5000 mg | ORAL_SOLUTION | ORAL | Status: DC | PRN

## 2017-10-12 MED ORDER — MENTHOL 3 MG MT LOZG
1.0000 | LOZENGE | OROMUCOSAL | Status: DC | PRN
  Filled 2017-10-12: qty 9

## 2017-10-12 MED ORDER — FENTANYL CITRATE (PF) 100 MCG/2ML IJ SOLN
INTRAMUSCULAR | Status: AC
  Filled 2017-10-12: qty 2

## 2017-10-12 MED ORDER — SUCCINYLCHOLINE CHLORIDE 200 MG/10ML IV SOSY
PREFILLED_SYRINGE | INTRAVENOUS | Status: DC | PRN
  Administered 2017-10-12: 100 mg via INTRAVENOUS

## 2017-10-12 MED ORDER — PROPOFOL 10 MG/ML IV BOLUS
INTRAVENOUS | Status: DC | PRN
  Administered 2017-10-12: 70 mg via INTRAVENOUS
  Administered 2017-10-12: 20 mg via INTRAVENOUS

## 2017-10-12 MED ORDER — METOCLOPRAMIDE HCL 5 MG PO TABS: 5.0000 mg | ORAL_TABLET | Freq: Three times a day (TID) | ORAL | Status: DC | PRN

## 2017-10-12 MED ORDER — FENTANYL CITRATE (PF) 100 MCG/2ML IJ SOLN: 25.0000 ug | INTRAMUSCULAR | Status: DC | PRN

## 2017-10-12 MED ORDER — ONDANSETRON HCL 4 MG PO TABS
4.0000 mg | ORAL_TABLET | Freq: Four times a day (QID) | ORAL | Status: DC | PRN
Start: 2017-10-12 — End: 2017-10-14

## 2017-10-12 SURGICAL SUPPLY — 36 items
BAG DECANTER FOR FLEXI CONT (MISCELLANEOUS) ×3 IMPLANT
BAG SPEC THK2 15X12 ZIP CLS (MISCELLANEOUS)
BAG ZIPLOCK 12X15 (MISCELLANEOUS) IMPLANT
BLADE SAG 18X100X1.27 (BLADE) ×3 IMPLANT
CAPT HIP TOTAL 2 ×2 IMPLANT
CLOSURE WOUND 1/2 X4 (GAUZE/BANDAGES/DRESSINGS) ×1
CLOTH BEACON ORANGE TIMEOUT ST (SAFETY) ×3 IMPLANT
COVER PERINEAL POST (MISCELLANEOUS) ×3 IMPLANT
COVER SURGICAL LIGHT HANDLE (MISCELLANEOUS) ×3 IMPLANT
DECANTER SPIKE VIAL GLASS SM (MISCELLANEOUS) ×3 IMPLANT
DRAPE STERI IOBAN 125X83 (DRAPES) ×3 IMPLANT
DRAPE U-SHAPE 47X51 STRL (DRAPES) ×6 IMPLANT
DRSG ADAPTIC 3X8 NADH LF (GAUZE/BANDAGES/DRESSINGS) ×3 IMPLANT
DRSG MEPILEX BORDER 4X4 (GAUZE/BANDAGES/DRESSINGS) ×3 IMPLANT
DRSG MEPILEX BORDER 4X8 (GAUZE/BANDAGES/DRESSINGS) ×3 IMPLANT
DURAPREP 26ML APPLICATOR (WOUND CARE) ×3 IMPLANT
ELECT REM PT RETURN 15FT ADLT (MISCELLANEOUS) ×3 IMPLANT
EVACUATOR 1/8 PVC DRAIN (DRAIN) ×3 IMPLANT
GAUZE XEROFORM 1X8 LF (GAUZE/BANDAGES/DRESSINGS) ×2 IMPLANT
GLOVE BIO SURGEON STRL SZ7.5 (GLOVE) ×3 IMPLANT
GLOVE BIO SURGEON STRL SZ8 (GLOVE) ×6 IMPLANT
GLOVE BIOGEL PI IND STRL 8 (GLOVE) ×2 IMPLANT
GLOVE BIOGEL PI INDICATOR 8 (GLOVE) ×4
GOWN STRL REUS W/TWL LRG LVL3 (GOWN DISPOSABLE) ×3 IMPLANT
GOWN STRL REUS W/TWL XL LVL3 (GOWN DISPOSABLE) ×3 IMPLANT
PACK ANTERIOR HIP CUSTOM (KITS) ×3 IMPLANT
STRIP CLOSURE SKIN 1/2X4 (GAUZE/BANDAGES/DRESSINGS) ×2 IMPLANT
SUT ETHIBOND NAB CT1 #1 30IN (SUTURE) ×3 IMPLANT
SUT MNCRL AB 4-0 PS2 18 (SUTURE) ×3 IMPLANT
SUT STRATAFIX 0 PDS 27 VIOLET (SUTURE) ×3
SUT VIC AB 2-0 CT1 27 (SUTURE) ×6
SUT VIC AB 2-0 CT1 TAPERPNT 27 (SUTURE) ×2 IMPLANT
SUTURE STRATFX 0 PDS 27 VIOLET (SUTURE) ×1 IMPLANT
SYR 50ML LL SCALE MARK (SYRINGE) IMPLANT
TRAY FOLEY W/METER SILVER 16FR (SET/KITS/TRAYS/PACK) ×3 IMPLANT
YANKAUER SUCT BULB TIP 10FT TU (MISCELLANEOUS) ×3 IMPLANT

## 2017-10-12 NOTE — Op Note (Signed)
OPERATIVE REPORT- TOTAL HIP ARTHROPLASTY   PREOPERATIVE DIAGNOSIS: Right femoral neck fracture   POSTOPERATIVE DIAGNOSIS: Right femoral neck fracture  PROCEDURE: Right total hip arthroplasty, anterior approach.   SURGEON: Gaynelle Arabian, MD   ASSISTANT: Arlee Muslim, PA-C  ANESTHESIA:  General  ESTIMATED BLOOD LOSS:-400 mL    DRAINS: Hemovac x1.   COMPLICATIONS: None   CONDITION: PACU - hemodynamically stable.   BRIEF CLINICAL NOTE: Richard Spears is a 84 y.o. male who had a fall 3 days ago sustaining a displaced right femoral neck fracture.  He has been cleared medically and presents for right Total Hip Arthroplasty PROCEDURE IN DETAIL: After successful administration of spinal  anesthetic, the traction boots for the [facility] bed were placed on both  feet and the patient was placed onto the [facility] bed, boots placed into the leg  holders. The Right hip was then isolated from the perineum with plastic  drapes and prepped and draped in the usual sterile fashion. ASIS and  greater trochanter were marked and a oblique incision was made, starting  at about 1 cm lateral and 2 cm distal to the ASIS and coursing towards  the anterior cortex of the femur. The skin was cut with a 10 blade  through subcutaneous tissue to the level of the fascia overlying the  tensor fascia lata muscle. The fascia was then incised in line with the  incision at the junction of the anterior third and posterior 2/3rd. The  muscle was teased off the fascia and then the interval between the TFL  and the rectus was developed. The Hohmann retractor was then placed at  the top of the femoral neck over the capsule. The vessels overlying the  capsule were cauterized and the fat on top of the capsule was removed.  A Hohmann retractor was then placed anterior underneath the rectus  femoris to give exposure to the entire anterior capsule. A T-shaped  capsulotomy was performed. The edges were tagged and the  femoral head  was identified.       Osteophytes are removed off the superior acetabulum.  The femoral neck was then cut in situ with an oscillating saw. Traction  was then applied to the left lower extremity utilizing the [facility]  traction. The femoral head was then removed. Retractors were placed  around the acetabulum and then circumferential removal of the labrum was  performed. Osteophytes were also removed. Reaming starts at 49 mm to  medialize and  Increased in 2 mm increments to 53 mm. We reamed in  approximately 40 degrees of abduction, 20 degrees anteversion. A 54 mm  pinnacle acetabular shell was then impacted in anatomic position under  fluoroscopic guidance with excellent purchase. We did not need to place  any additional dome screws. A 36 mm neutral + 4 marathon liner was then  placed into the acetabular shell.       The femoral lift was then placed along the lateral aspect of the femur  just distal to the vastus ridge. The leg was  externally rotated and capsule  was stripped off the inferior aspect of the femoral neck down to the  level of the lesser trochanter, this was done with electrocautery. The femur was lifted after this was performed. The  leg was then placed in an extended and adducted position essentially delivering the femur. We also removed the capsule superiorly and the piriformis from the piriformis fossa to gain excellent exposure of the  proximal femur.  Rongeur was used to remove some cancellous bone to get  into the lateral portion of the proximal femur for placement of the  initial starter reamer. The starter broaches was placed  the starter broach  and was shown to go down the center of the canal. Broaching  with the  Corail system was then performed starting at size 8, coursing  Up to size 12. A size 12 had excellent torsional and rotational  and axial stability. The trial high offset neck was then placed  with a 36 + 5 trial head. The hip was then reduced. We  confirmed that  the stem was in the canal both on AP and lateral x-rays. It also has excellent sizing. The hip was reduced with outstanding stability through full extension and full external rotation.. AP pelvis was taken and the leg lengths were measured and found to be equal. Hip was then dislocated again and the femoral head and neck removed. The  femoral broach was removed. Size 12 Corail stem with a high offset  neck was then impacted into the femur following native anteversion. Has  excellent purchase in the canal. Excellent torsional and rotational and  axial stability. It is confirmed to be in the canal on AP and lateral  fluoroscopic views. The 36 + 5 METAL head was placed and the hip  reduced with outstanding stability. Again AP pelvis was taken and it  confirmed that the leg lengths were equal. The wound was then copiously  irrigated with saline solution and the capsule reattached and repaired  with Ethibond suture. 30 ml of .25% Bupivicaine was  injected into the capsule and into the edge of the tensor fascia lata as well as subcutaneous tissue. The fascia overlying the tensor fascia lata was then closed with a running #1 V-Loc. Subcu was closed with interrupted 2-0 Vicryl and subcuticular running 4-0 Monocryl. Incision was cleaned  and dried. Steri-Strips and a bulky sterile dressing applied. Hemovac  drain was hooked to suction and then the patient was awakened and transported to  recovery in stable condition.        Please note that a surgical assistant was a medical necessity for this procedure to perform it in a safe and expeditious manner. Assistant was necessary to provide appropriate retraction of vital neurovascular structures and to prevent femoral fracture and allow for anatomic placement of the prosthesis.  Gaynelle Arabian, M.D.

## 2017-10-12 NOTE — Progress Notes (Signed)
ANTICOAGULATION CONSULT NOTE - Follow Up Consult  Pharmacy Consult for Warfarin Indication: atrial fibrillation, mechanical mitral valve (PTA warfarin held)  Allergies  Allergen Reactions  . Flexeril [Cyclobenzaprine] Diarrhea    Patient Measurements: Height: 6' (182.9 cm) Weight: 155 lb (70.3 kg) IBW/kg (Calculated) : 77.6 Heparin Dosing Weight: TBW  Vital Signs: Temp: 98.1 F (36.7 C) (04/17 1233) Temp Source: Oral (04/17 1233) BP: 153/72 (04/17 1233) Pulse Rate: 76 (04/17 1233)  Labs: Recent Labs    10/10/17 0421 10/10/17 1348 10/11/17 0503 10/12/17 0456 10/12/17 1243  HGB 12.3*  --  12.2* 12.6*  --   HCT 36.3*  --  36.3* 36.8*  --   PLT 120*  --  112* 107*  --   APTT  --   --   --   --  50*  LABPROT 28.1*  --  24.1* 20.0* 19.0*  INR 2.66  --  2.18 1.72 1.60  HEPARINUNFRC 0.30 0.37 0.37 0.23*  --   CREATININE 1.16  --  1.09 1.01  --     Estimated Creatinine Clearance: 48.3 mL/min (by C-G formula based on SCr of 1.01 mg/dL).   Medical History: Past Medical History:  Diagnosis Date  . Anemia    leakoppenia  . BPH (benign prostatic hypertrophy)   . Bullous pemphigoid    [name], March 2011, right forearm squamous cell carcinoma  . Chronic anticoagulation    systemic  . Colon polyp    transverse, 2002  . History of peptic ulcer    remote, 3/95  . Hx of actinic keratosis   . Hx of basal cell carcinoma   . Hx of squamous cell carcinoma of skin   . Hyperlipidemia   . Left inguinal hernia   . Moderate aortic insufficiency 2009   audible aortic insufficiency on 1/09 echo  . PAF (paroxysmal atrial fibrillation) ([place]) 01/17/2014   On Warfarin.  . S/P mitral valve replacement with metallic valve 42/6834   INR goal 2.5-3.5, [employer],   . 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 bisoprolol (ZEBETA) 5 MG tablet Take 1 tablet (5  mg total) by mouth daily. 90 tablet 3 10/08/2017 at Unknown time  . COUMADIN 5 MG tablet Take 1 to 1 and 1/2 tablets daily as directed by coumadin clinic 120 tablet 1 10/08/2017 at Unknown time  . fluticasone (FLONASE) 50 MCG/ACT nasal spray Place 2 sprays into both nostrils as needed for allergies or rhinitis.   Past Month at Unknown time  . furosemide (LASIX) 40 MG tablet Take 1 tablet (40 mg total) by mouth daily and take an additional 0.5 tablet (20 mg total) by mouth 3 days a week. 135 tablet 3 10/09/2017 at Unknown time  . hydrALAZINE (APRESOLINE) 25 MG tablet Take 1 tablet (25 mg total) by mouth daily at lunchtime. Take an additional tablet as needed for systolic blood pressure >196 mmHg 135 tablet 3 10/08/2017 at Unknown time  . isosorbide mononitrate (IMDUR) 30 MG 24 hr tablet TAKE 1 TABLET DAILY (Patient taking differently: TAKE 1 TABLET (30 mg) DAILY) 90 tablet 0 10/08/2017 at Unknown time  . ketoconazole (NIZORAL) 2 % shampoo Apply 1 application topically 2 (two) times a week.   Past Week at Unknown time  . LORazepam (ATIVAN) 1 MG tablet Take 1 mg by mouth every 8 (eight) hours as needed  for anxiety.    10/08/2017 at Unknown time  . Multiple Vitamins-Minerals (MULTIVITAMIN PO) Take 1 tablet by mouth daily.   10/09/2017 at Unknown time  . warfarin (COUMADIN) 5 MG tablet Take 1 to 1.5 tablets by mouth daily as directed (Patient not taking: Reported on 10/09/2017) 120 tablet 1 Not Taking at Unknown time   Scheduled:  . [MAR Hold] acetaminophen  1,000 mg Oral Q8H  . [MAR Hold] bisoprolol  5 mg Oral Daily  . [MAR Hold] hydrALAZINE  25 mg Oral Q lunch  . [MAR Hold] isosorbide mononitrate  30 mg Oral Daily  . [MAR Hold] polyethylene glycol  17 g Oral BID  . [MAR Hold] senna  1 tablet Oral BID   Infusions:  . [MAR Hold]  ceFAZolin (ANCEF) IV    .  ceFAZolin (ANCEF) IV    . lactated ringers 50 mL/hr at 10/12/17 1245    Assessment: 3 yoM presents to [facility] ED on 4/14 with right hip fracture.  PMH is  significant for chronic warfarin anticoagulation for afib and mechanical mitral valve replacement.  Vit K given to reverse INR prior to planned surgery.  Pharmacy is consulted to dose Heparin perioperatively.  PTA warfarin 5mg  daily except 7.5 mg on Sun/Wed/Fridays.  Last dose taken on 10/08/17. INR on admission is subtherapeutic at 2.39 (goal INR 2.5-3.5)  S/p R THA 4/17 - to resume warfarin postop  Today, 10/12/2017  INR subtherapeutic this AM as expected  Heparin level SUBtherapeutic range (0.23) on heparin 1100 units/hr, drip stopped at 6AM in anticipation of surgery   CBC: Hgb 12.6, Plts low and decreasing 107k, 134k prior to starting heparin  No bleeding or complications reported  Goal of Therapy:  Heparin level 0.3-0.7 units/ml Monitor platelets by anticoagulation protocol: Yes   Plan:   Resume warfarin - give boosted dose of 10mg  PO x 1 tonight  Per discussion with Dr. Wynelle Link, no IV heparin post-op, use Lovenox 40mg  SQ q24h starting POD1 4/18  Monitor for signs/symptoms of bleeding or thrombosis  Peggyann Juba, PharmD, BCPS Pager: [phone] 10/12/2017,2:36 PM

## 2017-10-12 NOTE — Anesthesia Postprocedure Evaluation (Signed)
Anesthesia Post Note  Patient: Richard Spears  Procedure(s) Performed: RIGHT TOTAL HIP ARTHROPLASTY ANTERIOR APPROACH (Right Hip)     Patient location during evaluation: PACU Anesthesia Type: General Level of consciousness: awake and alert Pain management: pain level controlled Vital Signs Assessment: post-procedure vital signs reviewed and stable Respiratory status: spontaneous breathing, nonlabored ventilation, respiratory function stable and patient connected to nasal cannula oxygen Cardiovascular status: blood pressure returned to baseline and stable Postop Assessment: no apparent nausea or vomiting Anesthetic complications: no    Last Vitals:  Vitals:   10/12/17 1645 10/12/17 1700  BP: (!) 156/78 (!) 157/70  Pulse: 77 77  Resp: 18   Temp: 36.9 C   SpO2: 98% 97%    Last Pain:  Vitals:   10/12/17 1645  TempSrc:   PainSc: 0-No pain                 Alexx Giambra DAVID

## 2017-10-12 NOTE — Transfer of Care (Signed)
Immediate Anesthesia Transfer of Care Note  Patient: Richard Spears  Procedure(s) Performed: Procedure(s): RIGHT TOTAL HIP ARTHROPLASTY ANTERIOR APPROACH (Right)  Patient Location: PACU  Anesthesia Type:General  Level of Consciousness:  sedated, patient cooperative and responds to stimulation  Airway & Oxygen Therapy:Patient Spontanous Breathing and Patient connected to face mask oxgen  Post-op Assessment:  Report given to PACU RN and Post -op Vital signs reviewed and stable  Post vital signs:  Reviewed and stable  Last Vitals:  Vitals:   10/12/17 1233 10/12/17 1618  BP: (!) 153/72 (!) 153/76  Pulse: 76 73  Resp: 16 12  Temp: 36.7 C 37.3 C  SpO2: 95% (P) 377%    Complications: No apparent anesthesia complications

## 2017-10-12 NOTE — Progress Notes (Signed)
[facility]  PROGRESS NOTE  Richard Spears GBT:[id number] DOB: [birth year] DOA: 10/09/2017 PCP: [name], MD   Brief HPI:    84 y.o.malewith medical history significant ofanemia, BPH, A. fib, mechanical mitral valve on warfarin, L hip replacementand several other medical problems who presents after mechanical fall with a right hip fracture. He notes that he was walking to the dining hall. He slipped on mud on the path and fell onto the grass hitting his right hip. He notes that he did not not hit any other part of his body. He did not hit his head. He did not have any loss of consciousness. He notes a dull pain to his hip which is sharp with movement.  Patient was admitted for right hip fracture, orthopedic surgery consulted. He received vit K to reverse INR and planned for OR 4/17.      Subjective   Patient seen and examined.  Denies any pain.  Plan for surgery today.   Assessment/Plan:     1. Right femoral neck fracture-Ortho consulted, pain control.  Plan for ORIF today.  Coumadin is currently on hold.  Continue heparin GGT. 2. Mechanical mitral valve on Coumadin-INR goal is 2.5-3.5.  Continue heparin GGT. 3. Paroxysmal atrial fibrillation-heart rate is controlled, continue bisoprolol.  Anticoagulation with Coumadin, which is currently on hold as above. 4. Hypertension-blood pressure stable, continue isosorbide, hydralazine, bisoprolol 5. Anxiety-continue Ativan as needed.    DVT prophylaxis: Heparin GTT, Coumadin on hold  Code Status: Full code  Family Communication: No family at bedside  Disposition Plan: likely home when medically ready for discharge   Consultants:   orthopedics  Procedures:  None   Antibiotics:   Anti-infectives (From admission, onward)   Start     Dose/Rate Route Frequency Ordered Stop   10/12/17 1300  ceFAZolin (ANCEF) IVPB 2g/100 mL premix     2 g 200 mL/hr over 30 Minutes Intravenous  Once 10/12/17 1248      10/12/17 0600  [MAR Hold]  ceFAZolin (ANCEF) IVPB 2g/100 mL premix     (MAR Hold since Wed 10/12/2017 at 1232. Reason: Transfer to a Procedural area.)   2 g 200 mL/hr over 30 Minutes Intravenous On call to O.R. 10/11/17 1355 10/13/17 0559   10/12/17 0000  ceFAZolin (ANCEF) powder 1 g  Status:  Discontinued     1 g Other To Surgery 10/11/17 1335 10/11/17 1354       Objective   Vitals:   10/11/17 2006 10/12/17 0359 10/12/17 1201 10/12/17 1233  BP: 137/62 (!) 158/68 (!) 138/59 (!) 153/72  Pulse: 76 77 73 76  Resp: 18 14  16   Temp: 98.9 F (37.2 C) 99.8 F (37.7 C)  98.1 F (36.7 C)  TempSrc: Oral Axillary  Oral  SpO2: 94% 95% 95% 95%  Weight:  70.4 kg (155 lb 3.3 oz)  70.3 kg (155 lb)  Height:    6' (1.829 m)    Intake/Output Summary (Last 24 hours) at 10/12/2017 1257 Last data filed at 10/12/2017 1245 Gross per 24 hour  Intake 863.63 ml  Output 1225 ml  Net -361.37 ml   Filed Weights   10/11/17 0534 10/12/17 0359 10/12/17 1233  Weight: 70.2 kg (154 lb 12.2 oz) 70.4 kg (155 lb 3.3 oz) 70.3 kg (155 lb)     Physical Examination:   Physical Exam: Eyes: No icterus, extraocular muscles intact  Mouth: Oral mucosa is moist, no lesions on palate,  Neck: Supple, no deformities, masses, or tenderness Lungs:  Normal respiratory effort, bilateral clear to auscultation, no crackles or wheezes.  Heart: Regular rate and rhythm, S1 and S2 normal, no murmurs, rubs auscultated Abdomen: BS normoactive,soft,nondistended,non-tender to palpation,no organomegaly Extremities: No pretibial edema, no erythema, no cyanosis, no clubbing Neuro : Alert and oriented to time, place and person, No focal deficits      Data Reviewed: I have personally reviewed following labs and imaging studies  CBG: No results for input(s): GLUCAP in the last 168 hours.  CBC: Recent Labs  Lab 10/09/17 1501 10/10/17 0421 10/11/17 0503 10/12/17 0456  WBC 3.7* 4.3 3.5* 3.7*  NEUTROABS 3.0  --   --   --    HGB 12.5* 12.3* 12.2* 12.6*  HCT 36.6* 36.3* 36.3* 36.8*  MCV 98.4 98.6 99.7 98.7  PLT 134* 120* 112* 107*    Basic Metabolic Panel: Recent Labs  Lab 10/09/17 1501 10/09/17 2030 10/10/17 0421 10/11/17 0503 10/12/17 0456  NA 135  --  132* 133* 135  K 4.5  --  4.5 4.3 4.3  CL 99*  --  99* 102 102  CO2 23  --  26 24 25   GLUCOSE 111*  --  123* 89 87  BUN 26*  --  23* 21* 17  CREATININE 1.26*  --  1.16 1.09 1.01  CALCIUM 9.0  --  8.6* 8.6* 9.0  MG  --  2.3 2.1  --   --     Recent Results (from the past 240 hour(s))  Surgical PCR screen     Status: None   Collection Time: 10/09/17  8:08 PM  Result Value Ref Range Status   MRSA, PCR NEGATIVE NEGATIVE Final   Staphylococcus aureus NEGATIVE NEGATIVE Final    Comment: (NOTE) The Xpert SA Assay (FDA approved for NASAL specimens in patients 51 years of age and older), is one component of a comprehensive surveillance program. It is not intended to diagnose infection nor to guide or monitor treatment. Performed at [facility], [place] [address] [place], [place] [address]      Liver Function Tests: Recent Labs  Lab 10/09/17 1501 10/10/17 0421  AST 39 36  ALT 21 23  ALKPHOS 59 49  BILITOT 1.3* 1.4*  PROT 7.1 6.2*  ALBUMIN 4.2 3.5   No results for input(s): LIPASE, AMYLASE in the last 168 hours. No results for input(s): AMMONIA in the last 168 hours.     Studies: No results found.  Scheduled Meds: . [MAR Hold] acetaminophen  1,000 mg Oral Q8H  . [MAR Hold] bisoprolol  5 mg Oral Daily  . [MAR Hold] hydrALAZINE  25 mg Oral Q lunch  . [MAR Hold] isosorbide mononitrate  30 mg Oral Daily  . [MAR Hold] polyethylene glycol  17 g Oral BID  . [MAR Hold] senna  1 tablet Oral BID      Time spent: 25 min  [facility] Pager [phone]. If 7PM-7AM, please contact night-coverage at [email or web address], Office  [phone]  password [email or web address]  10/12/2017, 12:57 PM  LOS: 3 days

## 2017-10-12 NOTE — Anesthesia Preprocedure Evaluation (Signed)
Anesthesia Evaluation  Patient identified by MRN, date of birth, ID band Patient awake    Reviewed: Allergy & Precautions, NPO status , Patient's Chart, lab work & pertinent test results  Airway Mallampati: II  TM Distance: >3 FB Neck ROM: Full    Dental no notable dental hx. (+) Edentulous Upper, Edentulous Lower   Pulmonary former smoker,    Pulmonary exam normal breath sounds clear to auscultation       Cardiovascular hypertension, Normal cardiovascular exam+ dysrhythmias Atrial Fibrillation + Valvular Problems/Murmurs (s/p mechanical MVR) AI  Rhythm:Regular Rate:Normal     Neuro/Psych negative neurological ROS  negative psych ROS   GI/Hepatic negative GI ROS, Neg liver ROS,   Endo/Other  negative endocrine ROS  Renal/GU negative Renal ROS  negative genitourinary   Musculoskeletal negative musculoskeletal ROS (+)   Abdominal   Peds negative pediatric ROS (+)  Hematology negative hematology ROS (+)   Anesthesia Other Findings   Reproductive/Obstetrics negative OB ROS                             Anesthesia Physical Anesthesia Plan  ASA: III  Anesthesia Plan: General   Post-op Pain Management:    Induction: Intravenous  PONV Risk Score and Plan: 2 and Ondansetron and Treatment may vary due to age or medical condition  Airway Management Planned: Oral ETT  Additional Equipment:   Intra-op Plan:   Post-operative Plan: Extubation in OR  Informed Consent: I have reviewed the patients History and Physical, chart, labs and discussed the procedure including the risks, benefits and alternatives for the proposed anesthesia with the patient or authorized representative who has indicated his/her understanding and acceptance.   Dental advisory given  Plan Discussed with: CRNA  Anesthesia Plan Comments: (PTT elevated. GA)        Anesthesia Quick Evaluation

## 2017-10-12 NOTE — Anesthesia Procedure Notes (Addendum)
Procedure Name: Intubation Date/Time: 10/12/2017 2:23 PM Performed by: Anne Fu, CRNA Pre-anesthesia Checklist: Patient identified, Emergency Drugs available, Suction available, Patient being monitored and Timeout performed Patient Re-evaluated:Patient Re-evaluated prior to induction Oxygen Delivery Method: Circle system utilized Preoxygenation: Pre-oxygenation with 100% oxygen Induction Type: IV induction Ventilation: Mask ventilation without difficulty Laryngoscope Size: Mac and 4 Tube type: Oral Tube size: 7.5 mm Number of attempts: 1 Airway Equipment and Method: Stylet Placement Confirmation: ETT inserted through vocal cords under direct vision,  positive ETCO2 and breath sounds checked- equal and bilateral Secured at: 21 cm Tube secured with: Tape Dental Injury: Teeth and Oropharynx as per pre-operative assessment

## 2017-10-12 NOTE — Interval H&P Note (Signed)
History and Physical Interval Note:  10/12/2017 12:51 PM  Richard Spears  has presented today for surgery, with the diagnosis of right femoral neck fracture  The various methods of treatment have been discussed with the patient and family. After consideration of risks, benefits and other options for treatment, the patient has consented to  Procedure(s): RIGHT TOTAL HIP ARTHROPLASTY ANTERIOR APPROACH (Right) as a surgical intervention .  The patient's history has been reviewed, patient examined, no change in status, stable for surgery.  I have reviewed the patient's chart and labs.  Questions were answered to the patient's satisfaction.     Pilar Plate [place]

## 2017-10-12 NOTE — Care Management Important Message (Signed)
Important Message  Patient Details  Name: JOHNRYAN SAO MRN: [id number] Date of Birth: [birth year]   Medicare Important Message Given:  Yes    Kerin Salen 10/12/2017, 11:35 AMImportant Message  Patient Details  Name: STARLING CHRISTOFFERSON MRN: [id number] Date of Birth: June 07, 1926   Medicare Important Message Given:  Yes    Kerin Salen 10/12/2017, 11:35 AM

## 2017-10-13 LAB — CBC
HEMATOCRIT: 29.3 % — AB (ref 39.0–52.0)
Hemoglobin: 10.1 g/dL — ABNORMAL LOW (ref 13.0–17.0)
MCH: 34.1 pg — ABNORMAL HIGH (ref 26.0–34.0)
MCHC: 34.5 g/dL (ref 30.0–36.0)
MCV: 99 fL (ref 78.0–100.0)
PLATELETS: 105 10*3/uL — AB (ref 150–400)
RBC: 2.96 MIL/uL — AB (ref 4.22–5.81)
RDW: 14.6 % (ref 11.5–15.5)
WBC: 3.3 10*3/uL — AB (ref 4.0–10.5)

## 2017-10-13 LAB — PROTIME-INR
INR: 2.15
Prothrombin Time: 23.8 s — ABNORMAL HIGH (ref 11.4–15.2)

## 2017-10-13 MED ORDER — OXYCODONE HCL 5 MG PO TABS: 5.0000 mg | ORAL_TABLET | ORAL | Status: DC | PRN

## 2017-10-13 MED ORDER — OXYCODONE HCL 5 MG PO TABS: 5.0000 mg | ORAL_TABLET | ORAL | 0 refills | Status: DC | PRN

## 2017-10-13 MED ORDER — TRAMADOL HCL 50 MG PO TABS: 50.0000 mg | ORAL_TABLET | Freq: Four times a day (QID) | ORAL | 0 refills | Status: DC | PRN

## 2017-10-13 MED ORDER — METHOCARBAMOL 500 MG PO TABS: 500.0000 mg | ORAL_TABLET | Freq: Four times a day (QID) | ORAL | 0 refills | Status: DC | PRN

## 2017-10-13 MED ORDER — TRAMADOL HCL 50 MG PO TABS
50.0000 mg | ORAL_TABLET | Freq: Four times a day (QID) | ORAL | Status: DC | PRN
  Administered 2017-10-13: 50 mg via ORAL
  Administered 2017-10-13 – 2017-10-14 (×2): 100 mg via ORAL
  Filled 2017-10-13 (×2): qty 2
  Filled 2017-10-13 (×2): qty 1

## 2017-10-13 MED ORDER — WARFARIN SODIUM 2.5 MG PO TABS
7.5000 mg | ORAL_TABLET | Freq: Once | ORAL | Status: AC
  Administered 2017-10-13: 7.5 mg via ORAL
  Filled 2017-10-13: qty 1

## 2017-10-13 NOTE — Progress Notes (Addendum)
[facility]  PROGRESS NOTE  Richard Spears MWU:[id number] DOB: Jan 23, 1926 DOA: 10/09/2017 PCP: [name], MD   Brief HPI:    84 y.o.malewith medical history significant ofanemia, BPH, A. fib, mechanical mitral valve on warfarin, L hip replacementand several other medical problems who presents after mechanical fall with a right hip fracture. He notes that he was walking to the dining hall. He slipped on mud on the path and fell onto the grass hitting his right hip. He notes that he did not not hit any other part of his body. He did not hit his head. He did not have any loss of consciousness. He notes a dull pain to his hip which is sharp with movement.  Patient was admitted for right hip fracture, orthopedic surgery consulted. He received vit K to reverse INR and planned for OR 4/17.      Subjective   Patient seen and examined, status post right total hip arthroplasty.  Denies pain at this time.   Assessment/Plan:     1. S/p right total hip arthroplasty-Ortho consulted for right femoral neck fracture, patient underwent total hip arthroplasty,  Coumadin has been restarted. 2. Mechanical mitral valve on Coumadin-INR goal is 2.5-3.5. Coumadin restarted. INR is 2.15 3. Paroxysmal atrial fibrillation-heart rate is controlled, continue bisoprolol.  Anticoagulation with Coumadin 4. Hypertension-blood pressure stable, continue isosorbide, hydralazine, bisoprolol 5. Anxiety-continue Ativan as needed.    DVT prophylaxis: Coumadin  Code Status: Full code  Family Communication: No family at bedside  Disposition Plan: likely home when medically ready for discharge   Consultants:   orthopedics  Procedures:  None   Antibiotics:   Anti-infectives (From admission, onward)   Start     Dose/Rate Route Frequency Ordered Stop   10/12/17 2030  ceFAZolin (ANCEF) IVPB 2g/100 mL premix     2 g 200 mL/hr over 30 Minutes Intravenous Every 6 hours 10/12/17 1704 10/13/17  0230   10/12/17 1300  ceFAZolin (ANCEF) IVPB 2g/100 mL premix     2 g 200 mL/hr over 30 Minutes Intravenous  Once 10/12/17 1248 10/12/17 1440   10/12/17 0600  ceFAZolin (ANCEF) IVPB 2g/100 mL premix     2 g 200 mL/hr over 30 Minutes Intravenous On call to O.R. 10/11/17 1355 10/13/17 0559   10/12/17 0000  ceFAZolin (ANCEF) powder 1 g  Status:  Discontinued     1 g Other To Surgery 10/11/17 1335 10/11/17 1354       Objective   Vitals:   10/12/17 1853 10/12/17 2200 10/13/17 0204 10/13/17 0625  BP: (!) 110/53 (!) 115/55 117/60 (!) 129/57  Pulse: 79 84 74 76  Resp: 18 15 16 18   Temp: 98.1 F (36.7 C) 98.4 F (36.9 C) 99 F (37.2 C) 98.5 F (36.9 C)  TempSrc: Oral Oral Oral Oral  SpO2: 93% 92% 97% 95%  Weight:    72 kg (158 lb 11.7 oz)  Height:        Intake/Output Summary (Last 24 hours) at 10/13/2017 0811 Last data filed at 10/13/2017 0600 Gross per 24 hour  Intake 3254.17 ml  Output 1605 ml  Net 1649.17 ml   Filed Weights   10/12/17 0359 10/12/17 1233 10/13/17 0625  Weight: 70.4 kg (155 lb 3.3 oz) 70.3 kg (155 lb) 72 kg (158 lb 11.7 oz)     Physical Examination:  Physical Exam: Eyes: No icterus, extraocular muscles intact  Mouth: Oral mucosa is moist, no lesions on palate,  Neck: Supple, no deformities, masses, or tenderness  Lungs: Normal respiratory effort, bilateral clear to auscultation, no crackles or wheezes.  Heart: Regular rate and rhythm, S1 and S2 normal, no murmurs, rubs auscultated Abdomen: BS normoactive,soft,nondistended,non-tender to palpation,no organomegaly Extremities: No pretibial edema, no erythema, no cyanosis, no clubbing Neuro : Alert and oriented to time, place and person, No focal deficits       Data Reviewed: I have personally reviewed following labs and imaging studies  CBG: No results for input(s): GLUCAP in the last 168 hours.  CBC: Recent Labs  Lab 10/09/17 1501 10/10/17 0421 10/11/17 0503 10/12/17 0456  WBC 3.7* 4.3  3.5* 3.7*  NEUTROABS 3.0  --   --   --   HGB 12.5* 12.3* 12.2* 12.6*  HCT 36.6* 36.3* 36.3* 36.8*  MCV 98.4 98.6 99.7 98.7  PLT 134* 120* 112* 107*    Basic Metabolic Panel: Recent Labs  Lab 10/09/17 1501 10/09/17 2030 10/10/17 0421 10/11/17 0503 10/12/17 0456  NA 135  --  132* 133* 135  K 4.5  --  4.5 4.3 4.3  CL 99*  --  99* 102 102  CO2 23  --  26 24 25   GLUCOSE 111*  --  123* 89 87  BUN 26*  --  23* 21* 17  CREATININE 1.26*  --  1.16 1.09 1.01  CALCIUM 9.0  --  8.6* 8.6* 9.0  MG  --  2.3 2.1  --   --     Recent Results (from the past 240 hour(s))  Surgical PCR screen     Status: None   Collection Time: 10/09/17  8:08 PM  Result Value Ref Range Status   MRSA, PCR NEGATIVE NEGATIVE Final   Staphylococcus aureus NEGATIVE NEGATIVE Final    Comment: (NOTE) The Xpert SA Assay (FDA approved for NASAL specimens in patients 13 years of age and older), is one component of a comprehensive surveillance program. It is not intended to diagnose infection nor to guide or monitor treatment. Performed at [facility], [place] [address], [place], [place] [address]      Liver Function Tests: Recent Labs  Lab 10/09/17 1501 10/10/17 0421  AST 39 36  ALT 21 23  ALKPHOS 59 49  BILITOT 1.3* 1.4*  PROT 7.1 6.2*  ALBUMIN 4.2 3.5   No results for input(s): LIPASE, AMYLASE in the last 168 hours. No results for input(s): AMMONIA in the last 168 hours.     Studies: Dg Pelvis Portable  Result Date: 10/12/2017 CLINICAL DATA:  Right hip replacement today EXAM: PORTABLE PELVIS 1-2 VIEWS COMPARISON:  10/09/2017 FINDINGS: Right hip replacement in satisfactory position alignment. No immediate complication Chronic left hip replacement without radiographic abnormality IMPRESSION: Satisfactory right hip replacement for fracture Electronically Signed   By: Franchot Gallo M.D.   On: 10/12/2017 17:20   Dg C-arm 1-60 Min-no Report  Result Date: 10/12/2017 Fluoroscopy was  utilized by the requesting physician.  No radiographic interpretation.    Scheduled Meds: . acetaminophen  1,000 mg Oral Q6H  . bisoprolol  5 mg Oral Daily  . dexamethasone  10 mg Intravenous Once  . docusate sodium  100 mg Oral BID  . enoxaparin (LOVENOX) injection  40 mg Subcutaneous Q24H  . hydrALAZINE  25 mg Oral Q lunch  . isosorbide mononitrate  30 mg Oral Daily  . polyethylene glycol  17 g Oral BID  . senna  1 tablet Oral BID  . warfarin  7.5 mg Oral ONCE-1800  . Warfarin - Pharmacist Dosing Inpatient  Does not apply q1800      Time spent: 25 min  [facility] Pager [phone]. If 7PM-7AM, please contact night-coverage at [email or web address], Office  [phone]  password [email or web address]  10/13/2017, 8:11 AM  LOS: 4 days

## 2017-10-13 NOTE — Progress Notes (Signed)
Subjective: 1 Day Post-Op Procedure(s) (LRB): RIGHT TOTAL HIP ARTHROPLASTY ANTERIOR APPROACH (Right) Patient reports pain as mild.   Patient seen in rounds with Dr. Wynelle Link. Patient is well, but has had some minor complaints of pain in the hip, requiring pain medications We will start therapy today.  From an ortho standpoint, he may be ready to go back to independent living (versus rehab at [facility]) as early as tomorrow.  Objective: Vital signs in last 24 hours: Temp:  [98.1 F (36.7 C)-99.1 F (37.3 C)] 98.5 F (36.9 C) (04/18 0625) Pulse Rate:  [73-84] 76 (04/18 0625) Resp:  [12-18] 18 (04/18 0625) BP: (110-157)/(53-78) 129/57 (04/18 0625) SpO2:  [92 %-100 %] 95 % (04/18 0625) Weight:  [70.3 kg (155 lb)-72 kg (158 lb 11.7 oz)] 72 kg (158 lb 11.7 oz) (04/18 0625)  Intake/Output from previous day:  Intake/Output Summary (Last 24 hours) at 10/13/2017 0928 Last data filed at 10/13/2017 0600 Gross per 24 hour  Intake 3254.17 ml  Output 1605 ml  Net 1649.17 ml    Intake/Output this shift: No intake/output data recorded.  Labs: Recent Labs    10/11/17 0503 10/12/17 0456  HGB 12.2* 12.6*   Recent Labs    10/11/17 0503 10/12/17 0456  WBC 3.5* 3.7*  RBC 3.64* 3.73*  HCT 36.3* 36.8*  PLT 112* 107*   Recent Labs    10/11/17 0503 10/12/17 0456  NA 133* 135  K 4.3 4.3  CL 102 102  CO2 24 25  BUN 21* 17  CREATININE 1.09 1.01  GLUCOSE 89 87  CALCIUM 8.6* 9.0   Recent Labs    10/12/17 1243 10/13/17 0525  INR 1.60 2.15    EXAM General - Patient is Alert, Appropriate and Oriented Extremity - Neurovascular intact Sensation intact distally Intact pulses distally Dorsiflexion/Plantar flexion intact Dressing - dressing C/D/I Motor Function - intact, moving foot and toes well on exam.  Hemovac pulled without difficulty.  Past Medical History:  Diagnosis Date  . Anemia    leakoppenia  . BPH (benign prostatic hypertrophy)   . Bullous pemphigoid    [name], March 2011, right forearm squamous cell carcinoma  . Chronic anticoagulation    systemic  . Colon polyp    transverse, 2002  . History of peptic ulcer    remote, 3/95  . Hx of actinic keratosis   . Hx of basal cell carcinoma   . Hx of squamous cell carcinoma of skin   . Hyperlipidemia   . Left inguinal hernia   . Moderate aortic insufficiency 2009   audible aortic insufficiency on 1/09 echo  . PAF (paroxysmal atrial fibrillation) ([place]) 01/17/2014   On Warfarin.  . S/P mitral valve replacement with metallic valve 04/9416   INR goal 2.5-3.5, [employer],   . 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   Weight as of this encounter: 72 kg (158 lb 11.7 oz). Advance diet Up with therapy  DVT Prophylaxis - Lovenox and Coumadin, INR is already 2.15 today so will discharge the Lovenox today. Weight Bearing As Tolerated  right Leg Hemovac Pulled Begin Therapy  Will place Ortho RXs on hard chart in anticipation of possible DC over the weekend.  Arlee Muslim, PA-C Orthopaedic Surgery 10/13/2017, 9:28 AM

## 2017-10-13 NOTE — Discharge Instructions (Signed)
Dr. Gaynelle Arabian Total Joint Specialist [facility] [address], [place], [place] [address] [phone]  ANTERIOR APPROACH TOTAL HIP REPLACEMENT POSTOPERATIVE DIRECTIONS   Hip Rehabilitation, Guidelines Following Surgery  The results of a hip operation are greatly improved after range of motion and muscle strengthening exercises. Follow all safety measures which are given to protect your hip. If any of these exercises cause increased pain or swelling in your joint, decrease the amount until you are comfortable again. Then slowly increase the exercises. Call your caregiver if you have problems or questions.   HOME CARE INSTRUCTIONS  Remove items at home which could result in a fall. This includes throw rugs or furniture in walking pathways.   ICE to the affected hip every three hours for 30 minutes at a time and then as needed for pain and swelling.  Continue to use ice on the hip for pain and swelling from surgery. You may notice swelling that will progress down to the foot and ankle.  This is normal after surgery.  Elevate the leg when you are not up walking on it.    Continue to use the breathing machine which will help keep your temperature down.  It is common for your temperature to cycle up and down following surgery, especially at night when you are not up moving around and exerting yourself.  The breathing machine keeps your lungs expanded and your temperature down.   DIET You may resume your previous home diet once your are discharged from the hospital.  DRESSING / WOUND CARE / SHOWERING You may shower 3 days after surgery, but keep the wounds dry during showering.  You may use an occlusive plastic wrap (Press'n Seal for example), NO SOAKING/SUBMERGING IN THE BATHTUB.  If the bandage gets wet, change with a clean dry gauze.  If the incision gets wet, pat the wound dry with a clean towel. You may start showering once you are discharged home but do not submerge  the incision under water. Just pat the incision dry and apply a dry gauze dressing on daily. Change the surgical dressing daily and reapply a dry dressing each time.  ACTIVITY Walk with your walker as instructed. Use walker as long as suggested by your caregivers. Avoid periods of inactivity such as sitting longer than an hour when not asleep. This helps prevent blood clots.  You may resume a sexual relationship in one month or when given the OK by your doctor.  You may return to work once you are cleared by your doctor.  Do not drive a car for 6 weeks or until released by you surgeon.  Do not drive while taking narcotics.  WEIGHT BEARING Weight bearing as tolerated with assist device (walker, cane, etc) as directed, use it as long as suggested by your surgeon or therapist, typically at least 4-6 weeks.  POSTOPERATIVE CONSTIPATION PROTOCOL Constipation - defined medically as fewer than three stools per week and severe constipation as less than one stool per week.  One of the most common issues patients have following surgery is constipation.  Even if you have a regular bowel pattern at home, your normal regimen is likely to be disrupted due to multiple reasons following surgery.  Combination of anesthesia, postoperative narcotics, change in appetite and fluid intake all can affect your bowels.  In order to avoid complications following surgery, here are some recommendations in order to help you during your recovery period.  Colace (docusate) - Pick up an over-the-counter  form of Colace or another stool softener and take twice a day as long as you are requiring postoperative pain medications.  Take with a full glass of water daily.  If you experience loose stools or diarrhea, hold the colace until you stool forms back up.  If your symptoms do not get better within 1 week or if they get worse, check with your doctor.  Dulcolax (bisacodyl) - Pick up over-the-counter and take as directed by the  product packaging as needed to assist with the movement of your bowels.  Take with a full glass of water.  Use this product as needed if not relieved by Colace only.   MiraLax (polyethylene glycol) - Pick up over-the-counter to have on hand.  MiraLax is a solution that will increase the amount of water in your bowels to assist with bowel movements.  Take as directed and can mix with a glass of water, juice, soda, coffee, or tea.  Take if you go more than two days without a movement. Do not use MiraLax more than once per day. Call your doctor if you are still constipated or irregular after using this medication for 7 days in a row.  If you continue to have problems with postoperative constipation, please contact the office for further assistance and recommendations.  If you experience "the worst abdominal pain ever" or develop nausea or vomiting, please contact the office immediatly for further recommendations for treatment.  ITCHING  If you experience itching with your medications, try taking only a single pain pill, or even half a pain pill at a time.  You can also use Benadryl over the counter for itching or also to help with sleep.   TED HOSE STOCKINGS Wear the elastic stockings on both legs for three weeks following surgery during the day but you may remove then at night for sleeping.  MEDICATIONS See your medication summary on the After Visit Summary that the nursing staff will review with you prior to discharge.  You may have some home medications which will be placed on hold until you complete the course of blood thinner medication.  It is important for you to complete the blood thinner medication as prescribed by your surgeon.  Continue your approved medications as instructed at time of discharge.  PRECAUTIONS If you experience chest pain or shortness of breath - call 911 immediately for transfer to the hospital emergency department.  If you develop a fever greater that 101 F, purulent  drainage from wound, increased redness or drainage from wound, foul odor from the wound/dressing, or calf pain - CONTACT YOUR SURGEON.                                                   FOLLOW-UP APPOINTMENTS Make sure you keep all of your appointments after your operation with your surgeon and caregivers. You should call the office at the above phone number and make an appointment for approximately two weeks after the date of your surgery or on the date instructed by your surgeon outlined in the "After Visit Summary".  RANGE OF MOTION AND STRENGTHENING EXERCISES  These exercises are designed to help you keep full movement of your hip joint. Follow your caregiver's or physical therapist's instructions. Perform all exercises about fifteen times, three times per day or as directed. Exercise both hips, even if you  have had only one joint replacement. These exercises can be done on a training (exercise) mat, on the floor, on a table or on a bed. Use whatever works the best and is most comfortable for you. Use music or television while you are exercising so that the exercises are a pleasant break in your day. This will make your life better with the exercises acting as a break in routine you can look forward to.  Lying on your back, slowly slide your foot toward your buttocks, raising your knee up off the floor. Then slowly slide your foot back down until your leg is straight again.  Lying on your back spread your legs as far apart as you can without causing discomfort.  Lying on your side, raise your upper leg and foot straight up from the floor as far as is comfortable. Slowly lower the leg and repeat.  Lying on your back, tighten up the muscle in the front of your thigh (quadriceps muscles). You can do this by keeping your leg straight and trying to raise your heel off the floor. This helps strengthen the largest muscle supporting your knee.  Lying on your back, tighten up the muscles of your buttocks both  with the legs straight and with the knee bent at a comfortable angle while keeping your heel on the floor.   IF YOU ARE TRANSFERRED TO A SKILLED REHAB FACILITY If the patient is transferred to a skilled rehab facility following release from the hospital, a list of the current medications will be sent to the facility for the patient to continue.  When discharged from the skilled rehab facility, please have the facility set up the patient's [place] prior to being released. Also, the skilled facility will be responsible for providing the patient with their medications at time of release from the facility to include their pain medication, the muscle relaxants, and their blood thinner medication. If the patient is still at the rehab facility at time of the two week follow up appointment, the skilled rehab facility will also need to assist the patient in arranging follow up appointment in our office and any transportation needs.  MAKE SURE YOU:  Understand these instructions.  Get help right away if you are not doing well or get worse.    Pick up stool softner and laxative for home use following surgery while on pain medications. Do not submerge incision under water. Please use good hand washing techniques while changing dressing each day. May shower starting three days after surgery. Please use a clean towel to pat the incision dry following showers. Continue to use ice for pain and swelling after surgery. Do not use any lotions or creams on the incision until instructed by your surgeon.  He will resume his Coumadin Protocol following discharge.

## 2017-10-13 NOTE — [place] FL2 (Signed)
[place] MEDICAID FL2 LEVEL OF CARE SCREENING TOOL     IDENTIFICATION  Patient Name: Richard Spears Birthdate: Jul 20, 1925 Sex: male Admission Date (Current Location): 10/09/2017  [facility] and [place] Number:  Herbalist and Address:  [facility],  [place] [address], [place]      Provider Number: [id number]  Attending Physician Name and Address:  Oswald Hillock, MD  Relative Name and Phone Number:       Current Level of Care: Hospital Recommended Level of Care: [place] Prior Approval Number:    Date Approved/Denied:   PASRR Number: [id number]  Discharge Plan: SNF    Current Diagnoses: Patient Active Problem List   Diagnosis Date Noted  . Hip fracture ([place]) 10/09/2017  . Essential hypertension 05/12/2017  . Exertional dyspnea 01/18/2016  . Venous insufficiency 02/14/2015  . Cancer of skin of right lower leg 10/31/2014  . AI (aortic insufficiency) 01/23/2014  . Paroxysmal atrial fibrillation ([place]) 01/17/2014  . Hyperlipidemia   . Mitral valve disorder 05/09/2013  . S/P MVR (mitral valve replacement) 04/08/1996    Orientation RESPIRATION BLADDER Height & Weight     Self, Time, Situation, Place  Normal Continent Weight: 158 lb 11.7 oz (72 kg) Height:  6' (182.9 cm)  BEHAVIORAL SYMPTOMS/MOOD NEUROLOGICAL BOWEL NUTRITION STATUS        Diet(see dc summary)  AMBULATORY STATUS COMMUNICATION OF NEEDS Skin   Limited Assist Verbally Other (Comment)( Incision(Closed)04/17/19HipRight Adhesive)                       Personal Care Assistance Level of Assistance  Bathing, Feeding, Dressing Bathing Assistance: Limited assistance Feeding assistance: Independent Dressing Assistance: Limited assistance     Functional Limitations Info  Sight, Hearing, Speech Sight Info: Adequate Hearing Info: Adequate Speech Info: Adequate    SPECIAL CARE FACTORS FREQUENCY  PT (By licensed PT), OT (By licensed OT)     PT  Frequency: 5x/week OT Frequency: 5x/week            Contractures Contractures Info: Not present    Additional Factors Info  Code Status, Allergies Code Status Info: Full Code Allergies Info: Flexeril Cyclobenzaprine           Current Medications (10/13/2017):  This is the current hospital active medication list Current Facility-Administered Medications  Medication Dose Route Frequency Provider Last Rate Last Dose  . 0.9 %  sodium chloride infusion   Intravenous Continuous Gaynelle Arabian, MD 100 mL/hr at 10/13/17 1004    . acetaminophen (TYLENOL) tablet 1,000 mg  1,000 mg Oral Q6H Aluisio, Pilar Plate, MD   1,000 mg at 10/13/17 0755  . acetaminophen (TYLENOL) tablet 325-650 mg  325-650 mg Oral Q6H PRN Aluisio, Pilar Plate, MD      . bisacodyl (DULCOLAX) suppository 10 mg  10 mg Rectal Daily PRN Gaynelle Arabian, MD      . bisoprolol (ZEBETA) tablet 5 mg  5 mg Oral Daily Gaynelle Arabian, MD   5 mg at 10/13/17 0955  . diphenhydrAMINE (BENADRYL) 12.5 MG/5ML elixir 12.5-25 mg  12.5-25 mg Oral Q4H PRN Aluisio, Pilar Plate, MD      . docusate sodium (COLACE) capsule 100 mg  100 mg Oral BID Gaynelle Arabian, MD   100 mg at 10/13/17 0954  . enoxaparin (LOVENOX) injection 40 mg  40 mg Subcutaneous Q24H Aluisio, Pilar Plate, MD   40 mg at 10/13/17 0956  . hydrALAZINE (APRESOLINE) tablet 25 mg  25 mg Oral Q lunch Aluisio,  Pilar Plate, MD   25 mg at 10/11/17 1255  . HYDROmorphone (DILAUDID) injection 0.5-1 mg  0.5-1 mg Intravenous Q4H PRN Aluisio, Pilar Plate, MD      . isosorbide mononitrate (IMDUR) 24 hr tablet 30 mg  30 mg Oral Daily Gaynelle Arabian, MD   30 mg at 10/13/17 0955  . LORazepam (ATIVAN) tablet 1 mg  1 mg Oral Q8H PRN Gaynelle Arabian, MD   1 mg at 10/11/17 2032  . menthol-cetylpyridinium (CEPACOL) lozenge 3 mg  1 lozenge Oral PRN Gaynelle Arabian, MD       Or  . phenol (CHLORASEPTIC) mouth spray 1 spray  1 spray Mouth/Throat PRN Aluisio, Pilar Plate, MD      . methocarbamol (ROBAXIN) tablet 500 mg  500 mg Oral Q6H PRN Gaynelle Arabian, MD       Or  . methocarbamol (ROBAXIN) 500 mg in dextrose 5 % 50 mL IVPB  500 mg Intravenous Q6H PRN Gaynelle Arabian, MD   Stopped at 10/12/17 1705  . metoCLOPramide (REGLAN) tablet 5-10 mg  5-10 mg Oral Q8H PRN Aluisio, Pilar Plate, MD       Or  . metoCLOPramide (REGLAN) injection 5-10 mg  5-10 mg Intravenous Q8H PRN Aluisio, Pilar Plate, MD      . ondansetron (ZOFRAN) tablet 4 mg  4 mg Oral Q6H PRN Gaynelle Arabian, MD       Or  . ondansetron (ZOFRAN) injection 4 mg  4 mg Intravenous Q6H PRN Aluisio, Pilar Plate, MD      . oxyCODONE (Oxy IR/ROXICODONE) immediate release tablet 5-10 mg  5-10 mg Oral Q4H PRN Aluisio, Pilar Plate, MD      . polyethylene glycol (MIRALAX / GLYCOLAX) packet 17 g  17 g Oral BID Gaynelle Arabian, MD   17 g at 10/13/17 0955  . polyethylene glycol (MIRALAX / GLYCOLAX) packet 17 g  17 g Oral Daily PRN Aluisio, Pilar Plate, MD      . senna (SENOKOT) tablet 8.6 mg  1 tablet Oral BID Gaynelle Arabian, MD   8.6 mg at 10/13/17 0955  . sodium phosphate (FLEET) 7-19 GM/118ML enema 1 enema  1 enema Rectal Once PRN Aluisio, Pilar Plate, MD      . traMADol Veatrice Bourbon) tablet 50-100 mg  50-100 mg Oral Q6H PRN Gaynelle Arabian, MD   50 mg at 10/13/17 1145  . warfarin (COUMADIN) tablet 7.5 mg  7.5 mg Oral ONCE-1800 Emiliano Dyer, [facility]      . Warfarin - Pharmacist Dosing Inpatient   Does not apply q1800 Gaynelle Arabian, MD         Discharge Medications: Please see discharge summary for a list of discharge medications.  Relevant Imaging Results:  Relevant Lab Results:   Additional Information SSN   [id number]  Burnis Medin, LCSW

## 2017-10-13 NOTE — Care Management Note (Signed)
Case Management Note  Patient Details  Name: LAVON BOTHWELL MRN: [id number] Date of Birth: 02-11-1926  Subjective/Objective:  84 y/o m admitted w/R hip fx. From [facility].POD#1 R THA. PT cons-await recc. CSW following.                 Action/Plan:d/c SNF.   Expected Discharge Date:  10/14/17               Expected Discharge Plan:  Skilled Nursing Facility  In-House Referral:  Clinical Social Work  Discharge planning Services     Post Acute Care Choice:    Choice offered to:     DME Arranged:    DME Agency:     HH Arranged:    HH Agency:     Status of Service:  In process, will continue to follow  If discussed at Long Length of Stay Meetings, dates discussed:    Additional Comments:  Dessa Phi, RN 10/13/2017, 10:40 AM

## 2017-10-13 NOTE — Evaluation (Signed)
Physical Therapy Evaluation Patient Details Name: Richard Spears MRN: [id number] DOB: Nov 24, 1925 Today's Date: 10/13/2017   History of Present Illness  84 yo male admitted with R fem neck fx. S/P R THA-direct anterior 10/12/17.   Clinical Impression  On eval, pt required Mod assist for mobility. He walked ~25 feet with a RW. Pain rated 6/10. Pt presents with general weakness, decreased activity tolerance, and impaired gait and balance. Discussed d/c plan-pt is agreeable to going to rehab at [facility]. Will follow and progress activity as tolerated.     Follow Up Recommendations SNF    Equipment Recommendations  None recommended by PT (TBD at next venue)    Recommendations for Other Services       Precautions / Restrictions Precautions Precautions: Fall Restrictions Weight Bearing Restrictions: No RLE Weight Bearing: Weight bearing as tolerated      Mobility  Bed Mobility Overal bed mobility: Needs Assistance Bed Mobility: Supine to Sit     Supine to sit: Mod assist;HOB elevated     General bed mobility comments: Assist for trunk and R LE. Utilized bedpad to aid with scooting. Increased time.   Transfers Overall transfer level: Needs assistance Equipment used: Rolling walker (2 wheeled) Transfers: Sit to/from Stand Sit to Stand: Mod assist;From elevated surface         General transfer comment: Assist to rise, stabilize, control descent. VCs safety, technique, hand/LE placement. Increased time.   Ambulation/Gait Ambulation/Gait assistance: Min assist Ambulation Distance (Feet): 23 Feet Assistive device: Rolling walker (2 wheeled) Gait Pattern/deviations: Step-to pattern;Decreased step length - left;Decreased step length - right;Antalgic     General Gait Details: VCs safety, technique, sequence, distance from RW, step length. Assist to stabilize pt and maneuver with RW. Pt fatigues easily.   Stairs            Wheelchair Mobility    Modified  Rankin (Stroke Patients Only)       Balance Overall balance assessment: Needs assistance         Standing balance support: Bilateral upper extremity supported Standing balance-Leahy Scale: Poor                               Pertinent Vitals/Pain Pain Assessment: 0-10 Pain Score: 6  Pain Location: R LE with activity Pain Descriptors / Indicators: Aching;Sore;Sharp Pain Intervention(s): Limited activity within patient's tolerance;Repositioned    Home Living Family/patient expects to be discharged to:: Private residence Living Arrangements: Spouse/significant other   Type of Home: Independent living facility Home Access: Level entry     Home Layout: One level Home Equipment: None      Prior Function Level of Independence: Independent               Hand Dominance        Extremity/Trunk Assessment   Upper Extremity Assessment Upper Extremity Assessment: Generalized weakness    Lower Extremity Assessment Lower Extremity Assessment: Generalized weakness    Cervical / Trunk Assessment Cervical / Trunk Assessment: Kyphotic  Communication   Communication: No difficulties  Cognition Arousal/Alertness: Awake/alert Behavior During Therapy: WFL for tasks assessed/performed Overall Cognitive Status: Within Functional Limits for tasks assessed                                        General Comments      Exercises General Exercises -  Lower Extremity Ankle Circles/Pumps: AROM;Both;10 reps;Supine Quad Sets: AROM;Both;10 reps;Supine Heel Slides: AAROM;Right;10 reps;Supine Hip ABduction/ADduction: AAROM;Right;10 reps;Supine   Assessment/Plan    PT Assessment Patient needs continued PT services  PT Problem List Decreased strength;Decreased range of motion;Decreased mobility;Decreased balance;Decreased activity tolerance;Decreased knowledge of use of DME;Pain       PT Treatment Interventions DME instruction;Gait  training;Functional mobility training;Therapeutic activities;Balance training;Patient/family education;Therapeutic exercise    PT Goals (Current goals can be found in the Care Plan section)  Acute Rehab PT Goals Patient Stated Goal: regain PLOF PT Goal Formulation: With patient Time For Goal Achievement: 10/27/17 Potential to Achieve Goals: Good    Frequency Min 3X/week   Barriers to discharge        Co-evaluation               AM-PAC PT "6 Clicks" Daily Activity  Outcome Measure Difficulty turning over in bed (including adjusting bedclothes, sheets and blankets)?: Unable Difficulty moving from lying on back to sitting on the side of the bed? : Unable Difficulty sitting down on and standing up from a chair with arms (e.g., wheelchair, bedside commode, etc,.)?: Unable Help needed moving to and from a bed to chair (including a wheelchair)?: A Lot Help needed walking in hospital room?: A Lot Help needed climbing 3-5 steps with a railing? : A Lot 6 Click Score: 9    End of Session Equipment Utilized During Treatment: Gait belt Activity Tolerance: Patient limited by fatigue;Patient limited by pain Patient left: in chair;with call bell/phone within reach;with chair alarm set   PT Visit Diagnosis: Muscle weakness (generalized) (M62.81);Difficulty in walking, not elsewhere classified (R26.2);Pain Pain - Right/Left: Right Pain - part of body: Leg    Time: 7371-0626 PT Time Calculation (min) (ACUTE ONLY): 45 min   Charges:   PT Evaluation $PT Eval Moderate Complexity: 1 Mod PT Treatments $Gait Training: 8-22 mins $Therapeutic Exercise: 8-22 mins   PT G Codes:          Weston Anna, MPT Pager: [phone]

## 2017-10-13 NOTE — Progress Notes (Signed)
ANTICOAGULATION CONSULT NOTE - Follow Up Consult  Pharmacy Consult for Warfarin Indication: atrial fibrillation, mechanical mitral valve (PTA warfarin held)  Allergies  Allergen Reactions  . Flexeril [Cyclobenzaprine] Diarrhea    Patient Measurements: Height: 6' (182.9 cm) Weight: 158 lb 11.7 oz (72 kg) IBW/kg (Calculated) : 77.6 Heparin Dosing Weight: TBW  Vital Signs: Temp: 98.5 F (36.9 C) (04/18 0625) Temp Source: Oral (04/18 0625) BP: 129/57 (04/18 0625) Pulse Rate: 76 (04/18 0625)  Labs: Recent Labs    10/10/17 1348  10/11/17 0503 10/12/17 0456 10/12/17 1243 10/13/17 0525  HGB  --   --  12.2* 12.6*  --   --   HCT  --   --  36.3* 36.8*  --   --   PLT  --   --  112* 107*  --   --   APTT  --   --   --   --  50*  --   LABPROT  --    < > 24.1* 20.0* 19.0* 23.8*  INR  --    < > 2.18 1.72 1.60 2.15  HEPARINUNFRC 0.37  --  0.37 0.23*  --   --   CREATININE  --   --  1.09 1.01  --   --    < > = values in this interval not displayed.    Estimated Creatinine Clearance: 49.5 mL/min (by C-G formula based on SCr of 1.01 mg/dL).   Medical History: Past Medical History:  Diagnosis Date  . Anemia    leakoppenia  . BPH (benign prostatic hypertrophy)   . Bullous pemphigoid    [name], March 2011, right forearm squamous cell carcinoma  . Chronic anticoagulation    systemic  . Colon polyp    transverse, 2002  . History of peptic ulcer    remote, 3/95  . Hx of actinic keratosis   . Hx of basal cell carcinoma   . Hx of squamous cell carcinoma of skin   . Hyperlipidemia   . Left inguinal hernia   . Moderate aortic insufficiency 2009   audible aortic insufficiency on 1/09 echo  . PAF (paroxysmal atrial fibrillation) ([place]) 01/17/2014   On Warfarin.  . S/P mitral valve replacement with metallic valve 06/7508   INR goal 2.5-3.5, [employer],   . 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 bisoprolol (ZEBETA) 5 MG tablet Take 1 tablet (5 mg total) by mouth daily. 90 tablet 3 10/08/2017 at Unknown time  . COUMADIN 5 MG tablet Take 1 to 1 and 1/2 tablets daily as directed by coumadin clinic 120 tablet 1 10/08/2017 at Unknown time  . fluticasone (FLONASE) 50 MCG/ACT nasal spray Place 2 sprays into both nostrils as needed for allergies or rhinitis.   Past Month at Unknown time  . furosemide (LASIX) 40 MG tablet Take 1 tablet (40 mg total) by mouth daily and take an additional 0.5 tablet (20 mg total) by mouth 3 days a week. 135 tablet 3 10/09/2017 at Unknown time  . hydrALAZINE (APRESOLINE) 25 MG tablet Take 1 tablet (25 mg total) by mouth daily at lunchtime. Take an additional tablet as needed for systolic blood pressure >258 mmHg 135 tablet 3 10/08/2017 at Unknown time  . isosorbide mononitrate (IMDUR) 30 MG 24 hr tablet TAKE 1 TABLET DAILY (Patient taking differently: TAKE 1 TABLET (30  mg) DAILY) 90 tablet 0 10/08/2017 at Unknown time  . ketoconazole (NIZORAL) 2 % shampoo Apply 1 application topically 2 (two) times a week.   Past Week at Unknown time  . LORazepam (ATIVAN) 1 MG tablet Take 1 mg by mouth every 8 (eight) hours as needed for anxiety.    10/08/2017 at Unknown time  . Multiple Vitamins-Minerals (MULTIVITAMIN PO) Take 1 tablet by mouth daily.   10/09/2017 at Unknown time  . warfarin (COUMADIN) 5 MG tablet Take 1 to 1.5 tablets by mouth daily as directed (Patient not taking: Reported on 10/09/2017) 120 tablet 1 Not Taking at Unknown time   Scheduled:  . acetaminophen  1,000 mg Oral Q6H  . bisoprolol  5 mg Oral Daily  . dexamethasone  10 mg Intravenous Once  . docusate sodium  100 mg Oral BID  . enoxaparin (LOVENOX) injection  40 mg Subcutaneous Q24H  . hydrALAZINE  25 mg Oral Q lunch  . isosorbide mononitrate  30 mg Oral Daily  . polyethylene glycol  17 g Oral BID  . senna  1 tablet Oral BID  . Warfarin - Pharmacist Dosing Inpatient    Does not apply q1800   Infusions:  . sodium chloride 100 mL/hr at 10/12/17 2137  . methocarbamol (ROBAXIN)  IV Stopped (10/12/17 1705)    Assessment: 22 yoM [facility] ED [facility]14 with right hip fracture.  PMH is significant for chronic warfarin anticoagulation for afib and mechanical mitral valve replacement.  Vit K given to reverse INR prior to planned surgery.  Pharmacy is consulted to dose Heparin perioperatively.  PTA warfarin 5mg  daily except 7.5 mg on Sun/Wed/Fridays.  Last dose taken on 10/08/17. INR on admission is subtherapeutic at 2.39 (goal INR 2.5-3.5)  S/p R THA 4/17 - to resume warfarin postop  Today, 10/13/2017  INR subtherapeutic this AM as expected (goal is 2.5 - 3.5)  CBC yesterday: Hgb 12.6, Plts low and decreasing 107k, 134k prior to starting heparin  No bleeding or complications reported  Goal of Therapy:  INR 2.5-3.5 with mechanical mitral valve replacement Monitor platelets by anticoagulation protocol: Yes   Plan:   Warfarin 7.5mg  PO x 1 tonight  Lovenox 40mg  SQ q24h starting POD1 4/18 until INR >/= 2.5  Daily PT/INR  Monitor for signs/symptoms of bleeding or thrombosis  Peggyann Juba, PharmD, BCPS Pager: [phone] 10/13/2017,6:59 AM

## 2017-10-13 NOTE — Clinical Social Work Note (Signed)
Clinical Social Work Assessment  Patient Details  Name: Richard Spears MRN: [id number] Date of Birth: 11-15-1925  Date of referral:  10/13/17               Reason for consult:  Facility Placement                Permission sought to share information with:  Facility Art therapist granted to share information::  Yes, Verbal Permission Granted  Name::        Agency::     Relationship::     Contact Information:     Housing/Transportation Living arrangements for the past 2 months:  Independent Living Facility([facility]) Source of Information:  Patient Patient Interpreter Needed:  None Criminal Activity/Legal Involvement Pertinent to Current Situation/Hospitalization:  No - Comment as needed Significant Relationships:  Spouse Lives with:  Facility Resident, Spouse Do you feel safe going back to the place where you live?  (PT recommending SNF) Need for family participation in patient care:  No (Coment)  Care giving concerns: Patient reported no care giving concerns. Patient from [place], where he resides with his wife. Patient reported that prior to hospitalization he is independent with ambulation and ADLs. Patient admitted with hip fracture after encountering a fall. PT recommending SNF.   Social Worker assessment / plan:  CSW spoke with patient at bedside regarding discharge plans and PT recommendation for SNF. Patient reported that he is agreeable to ST rehab at [facility] SNF. Patient reported that he has been at [facility] for approx 3 years and is completely independent at baseline. CSW explained SNF placement process and medicare coverage, patient verbalized understanding.   CSW will complete FL2 and follow up with [facility] SNF.  CSW will continue to follow and assist with discharge planning.  Employment status:  Retired Forensic scientist:  Medicare PT Recommendations:  [place] /  Referral to community resources:  [place]  Patient/Family's Response to care:  Patient appreciative of CSW assistance with discharge planning.   Patient/Family's Understanding of and Emotional Response to Diagnosis, Current Treatment, and Prognosis:  Patient presented calm and verbalized understanding of current treatment plan. Patient hopeful that he will complete ST rehab quickly and return home with his wife.   Emotional Assessment Appearance:  Appears younger than stated age Attitude/Demeanor/Rapport:  Other(Cooperative) Affect (typically observed):  Appropriate, Calm Orientation:  Oriented to Self, Oriented to Situation, Oriented to Place, Oriented to  Time Alcohol / Substance use:  Not Applicable Psych involvement (Current and /or in the community):  No (Comment)  Discharge Needs  Concerns to be addressed:  Care Coordination Readmission within the last 30 days:  No Current discharge risk:  Physical Impairment Barriers to Discharge:  Continued Medical Work up   [employer], LCSW 10/13/2017, 11:21 AM

## 2017-10-13 NOTE — Progress Notes (Signed)
Pt's family going to complete admission paperwork at [facility] this afternoon for pt to enter rehab at [place].  Sharren Bridge, MSW, LCSW Clinical Social Work 10/13/2017 [phone] coverage for [phone]

## 2017-10-13 NOTE — Progress Notes (Signed)
Physical Therapy Treatment Patient Details Name: Richard Spears MRN: [id number] DOB: 1926/04/03 Today's Date: 10/13/2017    History of Present Illness 84 yo male admitted with R fem neck fx. S/P R THA-direct anterior 10/12/17.     PT Comments    Progressing slowly with mobility. Will continue to follow. Plan is for SNF.    Follow Up Recommendations  SNF     Equipment Recommendations  (TBD at next venue)    Recommendations for Other Services       Precautions / Restrictions Precautions Precautions: Fall Restrictions Weight Bearing Restrictions: No RLE Weight Bearing: Weight bearing as tolerated    Mobility  Bed Mobility Overal bed mobility: Needs Assistance Bed Mobility: Sit to Supine     Supine to sit: Mod assist;HOB elevated Sit to supine: Mod assist;HOB elevated   General bed mobility comments: Assist for R LE. Increased time. Cues for technique.   Transfers Overall transfer level: Needs assistance Equipment used: Rolling walker (2 wheeled) Transfers: Sit to/from [facility] Sit to Stand: Mod assist Stand pivot transfers: Min assist       General transfer comment: Assist to rise, stabilize, control descent. VCs safety, technique, hand/LE placement. Increased time.   Ambulation/Gait Ambulation/Gait assistance: Min assist Ambulation Distance (Feet): 5 Feet Assistive device: Rolling walker (2 wheeled) Gait Pattern/deviations: Step-to pattern;Decreased step length - left;Decreased step length - right;Antalgic     General Gait Details: VCs safety, technique, sequence, distance from RW, step length. Assist to stabilize pt and maneuver with RW. Pt fatigues easily.    Stairs             Wheelchair Mobility    Modified Rankin (Stroke Patients Only)       Balance Overall balance assessment: Needs assistance         Standing balance support: Bilateral upper extremity supported Standing balance-Leahy Scale: Poor                               Cognition Arousal/Alertness: Awake/alert Behavior During Therapy: WFL for tasks assessed/performed Overall Cognitive Status: Within Functional Limits for tasks assessed                                        Exercises General Exercises - Lower Extremity Ankle Circles/Pumps: AROM;Both;10 reps;Supine Quad Sets: AROM;Both;10 reps;Supine Heel Slides: AAROM;Right;10 reps;Supine Hip ABduction/ADduction: AAROM;Right;10 reps;Supine    General Comments        Pertinent Vitals/Pain Pain Assessment: 0-10 Pain Score: 7  Pain Location: R LE with activity Pain Descriptors / Indicators: Aching;Sore;Sharp Pain Intervention(s): Limited activity within patient's tolerance;Repositioned    Home Living Family/patient expects to be discharged to:: Private residence Living Arrangements: Spouse/significant other   Type of Home: Independent living facility Home Access: Level entry   Home Layout: One level Home Equipment: None      Prior Function Level of Independence: Independent          PT Goals (current goals can now be found in the care plan section) Acute Rehab PT Goals Patient Stated Goal: regain PLOF PT Goal Formulation: With patient Time For Goal Achievement: 10/27/17 Potential to Achieve Goals: Good Progress towards PT goals: Progressing toward goals    Frequency    Min 3X/week      PT Plan Current plan remains appropriate    Co-evaluation  AM-PAC PT "6 Clicks" Daily Activity  Outcome Measure  Difficulty turning over in bed (including adjusting bedclothes, sheets and blankets)?: Unable Difficulty moving from lying on back to sitting on the side of the bed? : Unable Difficulty sitting down on and standing up from a chair with arms (e.g., wheelchair, bedside commode, etc,.)?: Unable Help needed moving to and from a bed to chair (including a wheelchair)?: A Lot Help needed walking in hospital room?: A  Lot Help needed climbing 3-5 steps with a railing? : A Lot 6 Click Score: 9    End of Session Equipment Utilized During Treatment: Gait belt Activity Tolerance: Patient limited by pain Patient left: in bed;with call bell/phone within reach;with bed alarm set;with family/visitor present   PT Visit Diagnosis: Muscle weakness (generalized) (M62.81);Difficulty in walking, not elsewhere classified (R26.2);Pain Pain - Right/Left: Right Pain - part of body: Leg     Time: 9892-1194 PT Time Calculation (min) (ACUTE ONLY): 18 min  Charges:  $Gait Training: 8-22 mins $Therapeutic Exercise: 8-22 mins $Therapeutic Activity: 8-22 mins                    G Codes:          Weston Anna, MPT Pager: [phone]

## 2017-10-14 DIAGNOSIS — D500 Iron deficiency anemia secondary to blood loss (chronic): Secondary | ICD-10-CM | POA: Diagnosis not present

## 2017-10-14 DIAGNOSIS — M6281 Muscle weakness (generalized): Secondary | ICD-10-CM | POA: Diagnosis not present

## 2017-10-14 DIAGNOSIS — I480 Paroxysmal atrial fibrillation: Secondary | ICD-10-CM | POA: Diagnosis not present

## 2017-10-14 DIAGNOSIS — M62838 Other muscle spasm: Secondary | ICD-10-CM | POA: Diagnosis not present

## 2017-10-14 DIAGNOSIS — S79911A Unspecified injury of right hip, initial encounter: Secondary | ICD-10-CM | POA: Diagnosis not present

## 2017-10-14 DIAGNOSIS — R2681 Unsteadiness on feet: Secondary | ICD-10-CM | POA: Diagnosis not present

## 2017-10-14 DIAGNOSIS — S72001A Fracture of unspecified part of neck of right femur, initial encounter for closed fracture: Secondary | ICD-10-CM | POA: Diagnosis not present

## 2017-10-14 DIAGNOSIS — Z96641 Presence of right artificial hip joint: Secondary | ICD-10-CM

## 2017-10-14 DIAGNOSIS — K5901 Slow transit constipation: Secondary | ICD-10-CM | POA: Diagnosis not present

## 2017-10-14 DIAGNOSIS — Z952 Presence of prosthetic heart valve: Secondary | ICD-10-CM | POA: Diagnosis not present

## 2017-10-14 DIAGNOSIS — E871 Hypo-osmolality and hyponatremia: Secondary | ICD-10-CM | POA: Diagnosis not present

## 2017-10-14 DIAGNOSIS — R29898 Other symptoms and signs involving the musculoskeletal system: Secondary | ICD-10-CM | POA: Diagnosis not present

## 2017-10-14 DIAGNOSIS — I872 Venous insufficiency (chronic) (peripheral): Secondary | ICD-10-CM | POA: Diagnosis not present

## 2017-10-14 DIAGNOSIS — G8911 Acute pain due to trauma: Secondary | ICD-10-CM | POA: Diagnosis not present

## 2017-10-14 DIAGNOSIS — S72001D Fracture of unspecified part of neck of right femur, subsequent encounter for closed fracture with routine healing: Secondary | ICD-10-CM | POA: Diagnosis not present

## 2017-10-14 DIAGNOSIS — R5381 Other malaise: Secondary | ICD-10-CM | POA: Diagnosis not present

## 2017-10-14 DIAGNOSIS — I10 Essential (primary) hypertension: Secondary | ICD-10-CM | POA: Diagnosis not present

## 2017-10-14 DIAGNOSIS — K5900 Constipation, unspecified: Secondary | ICD-10-CM | POA: Diagnosis not present

## 2017-10-14 LAB — BASIC METABOLIC PANEL
ANION GAP: 7 (ref 5–15)
BUN: 22 mg/dL — ABNORMAL HIGH (ref 6–20)
CO2: 21 mmol/L — ABNORMAL LOW (ref 22–32)
Calcium: 8.1 mg/dL — ABNORMAL LOW (ref 8.9–10.3)
Chloride: 103 mmol/L (ref 101–111)
Creatinine, Ser: 0.98 mg/dL (ref 0.61–1.24)
GFR calc Af Amer: >60 mL/min (ref >60)
GLUCOSE: 122 mg/dL — AB (ref 65–99)
POTASSIUM: 4.7 mmol/L (ref 3.5–5.1)
Sodium: 131 mmol/L — ABNORMAL LOW (ref 135–145)

## 2017-10-14 LAB — CBC
HEMATOCRIT: 28.5 % — AB (ref 39.0–52.0)
Hemoglobin: 9.7 g/dL — ABNORMAL LOW (ref 13.0–17.0)
MCH: 33 pg (ref 26.0–34.0)
MCHC: 34 g/dL (ref 30.0–36.0)
MCV: 96.9 fL (ref 78.0–100.0)
PLATELETS: 114 10*3/uL — AB (ref 150–400)
RBC: 2.94 MIL/uL — AB (ref 4.22–5.81)
RDW: 13.9 % (ref 11.5–15.5)
WBC: 4.3 10*3/uL (ref 4.0–10.5)

## 2017-10-14 LAB — PROTIME-INR
INR: 3.85
Prothrombin Time: 37.5 seconds — ABNORMAL HIGH (ref 11.4–15.2)

## 2017-10-14 MED ORDER — ONDANSETRON HCL 4 MG PO TABS: 4.0000 mg | ORAL_TABLET | Freq: Four times a day (QID) | ORAL | 0 refills | Status: DC | PRN

## 2017-10-14 MED ORDER — BISACODYL 10 MG RE SUPP: 10.0000 mg | Freq: Every day | RECTAL | 0 refills | Status: DC | PRN

## 2017-10-14 MED ORDER — POLYETHYLENE GLYCOL 3350 17 G PO PACK: 17.0000 g | PACK | Freq: Every day | ORAL | 0 refills | Status: DC | PRN

## 2017-10-14 MED ORDER — LORAZEPAM 1 MG PO TABS: 1.0000 mg | ORAL_TABLET | Freq: Three times a day (TID) | ORAL | 0 refills | Status: DC | PRN

## 2017-10-14 MED ORDER — DOCUSATE SODIUM 100 MG PO CAPS: 100.0000 mg | ORAL_CAPSULE | Freq: Two times a day (BID) | ORAL | 0 refills | Status: DC

## 2017-10-14 NOTE — Progress Notes (Addendum)
Physical Therapy Treatment Patient Details Name: Richard Spears MRN: [id number] DOB: 03-Apr-1926 Today's Date: 10/14/2017    History of Present Illness 84 yo male admitted with R fem neck fx. S/P R THA-direct anterior 10/12/17.     PT Comments    Progressing with mobility. Plan is for possible d/c to SNF later today per pt.     Follow Up Recommendations  SNF     Equipment Recommendations       Recommendations for Other Services       Precautions / Restrictions Precautions Precautions: Fall Restrictions Weight Bearing Restrictions: No RLE Weight Bearing: Weight bearing as tolerated    Mobility  Bed Mobility Overal bed mobility: Needs Assistance Bed Mobility: Sit to Supine       Sit to supine: Min assist;HOB elevated   General bed mobility comments: Assist for R LE. Increased time. Cues for technique.   Transfers Overall transfer level: Needs assistance Equipment used: Rolling walker (2 wheeled) Transfers: Sit to/from Stand Sit to Stand: Mod assist;From elevated surface         General transfer comment: Assist to rise, stabilize, control descent. VCs safety, technique, hand/LE placement. Increased time.   Ambulation/Gait Ambulation/Gait assistance: Min assist Ambulation Distance (Feet): 60 Feet Assistive device: Rolling walker (2 wheeled) Gait Pattern/deviations: Step-to pattern;Decreased step length - right;Decreased step length - left     General Gait Details: VCs safety, technique, sequence, distance from RW, step length. Assist to steady.    Stairs             Wheelchair Mobility    Modified Rankin (Stroke Patients Only)       Balance                                            Cognition Arousal/Alertness: Awake/alert Behavior During Therapy: WFL for tasks assessed/performed Overall Cognitive Status: Within Functional Limits for tasks assessed                                        Exercises  General Exercises - Lower Extremity Ankle Circles/Pumps: AROM;Both;10 reps;Supine Quad Sets: AROM;Both;10 reps;Supine Heel Slides: AAROM;Right;10 reps;Supine Hip ABduction/ADduction: AAROM;Right;10 reps;Supine    General Comments        Pertinent Vitals/Pain Pain Assessment: 0-10 Pain Score: 6  Pain Location: R LE with activity Pain Descriptors / Indicators: Aching;Sore;Sharp Pain Intervention(s): Limited activity within patient's tolerance;Monitored during session;Repositioned    Home Living                      Prior Function            PT Goals (current goals can now be found in the care plan section) Progress towards PT goals: Progressing toward goals    Frequency    Min 3X/week      PT Plan Current plan remains appropriate    Co-evaluation              AM-PAC PT "6 Clicks" Daily Activity  Outcome Measure  Difficulty turning over in bed (including adjusting bedclothes, sheets and blankets)?: Unable Difficulty moving from lying on back to sitting on the side of the bed? : Unable Difficulty sitting down on and standing up from a chair with arms (e.g., wheelchair, bedside commode, etc,.)?:  Unable Help needed moving to and from a bed to chair (including a wheelchair)?: A Lot Help needed walking in hospital room?: A Lot Help needed climbing 3-5 steps with a railing? : A Lot 6 Click Score: 9    End of Session Equipment Utilized During Treatment: Gait belt Activity Tolerance: Patient tolerated treatment well Patient left: in bed;with call bell/phone within reach;with family/visitor present   PT Visit Diagnosis: Muscle weakness (generalized) (M62.81);Difficulty in walking, not elsewhere classified (R26.2);Pain Pain - Right/Left: Right Pain - part of body: Leg     Time: 5520-8022 PT Time Calculation (min) (ACUTE ONLY): 31 min  Charges:  $Gait Training: 8-22 mins $Therapeutic Exercise: 8-22 mins                    G Codes:           Weston Anna, MPT Pager: [phone]

## 2017-10-14 NOTE — Progress Notes (Signed)
   Subjective: 2 Days Post-Op Procedure(s) (LRB): RIGHT TOTAL HIP ARTHROPLASTY ANTERIOR APPROACH (Right) Patient reports pain as mild.   Patient is well, and has had no acute complaints or problems We will start therapy today.   Objective: Vital signs in last 24 hours: Temp:  [97.6 F (36.4 C)-98.1 F (36.7 C)] 98.1 F (36.7 C) (04/19 0532) Pulse Rate:  [72-83] 82 (04/19 0532) Resp:  [12-16] 12 (04/19 0532) BP: (128-159)/(61-85) 152/77 (04/19 0532) SpO2:  [95 %-98 %] 95 % (04/19 0532) Weight:  [76.5 kg (168 lb 10.4 oz)] 76.5 kg (168 lb 10.4 oz) (04/19 0532)  Intake/Output from previous day:  Intake/Output Summary (Last 24 hours) at 10/14/2017 1046 Last data filed at 10/14/2017 1023 Gross per 24 hour  Intake 1993.33 ml  Output 700 ml  Net 1293.33 ml    Intake/Output this shift: Total I/O In: -  Out: 150 [Urine:150]  Labs: Recent Labs    10/12/17 0456 10/13/17 0832 10/14/17 0534  HGB 12.6* 10.1* 9.7*   Recent Labs    10/13/17 0832 10/14/17 0534  WBC 3.3* 4.3  RBC 2.96* 2.94*  HCT 29.3* 28.5*  PLT 105* 114*   Recent Labs    10/12/17 0456 10/14/17 0534  NA 135 131*  K 4.3 4.7  CL 102 103  CO2 25 21*  BUN 17 22*  CREATININE 1.01 0.98  GLUCOSE 87 122*  CALCIUM 9.0 8.1*   Recent Labs    10/13/17 0525 10/14/17 0534  INR 2.15 3.85    EXAM General - Patient is Alert, Appropriate and Oriented Extremity - Neurovascular intact Sensation intact distally Intact pulses distally Dorsiflexion/Plantar flexion intact Dressing - dressing C/D/I Motor Function - intact, moving foot and toes well on exam.  Dressing changed - incis c/d/i  Past Medical History:  Diagnosis Date  . Anemia    leakoppenia  . BPH (benign prostatic hypertrophy)   . Bullous pemphigoid    [name], March 2011, right forearm squamous cell carcinoma  . Chronic anticoagulation    systemic  . Colon polyp    transverse, 2002  . History of peptic ulcer    remote, 3/95  . Hx of  actinic keratosis   . Hx of basal cell carcinoma   . Hx of squamous cell carcinoma of skin   . Hyperlipidemia   . Left inguinal hernia   . Moderate aortic insufficiency 2009   audible aortic insufficiency on 1/09 echo  . PAF (paroxysmal atrial fibrillation) ([place]) 01/17/2014   On Warfarin.  . S/P mitral valve replacement with metallic valve 44/9675   INR goal 2.5-3.5, [employer],   . 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   Weight as of this encounter: 76.5 kg (168 lb 10.4 oz). Advance diet Up with therapy  DVT Prophylaxis - Coumadin PT D/C to [facility] today    Orthopaedic Surgery 10/14/2017, 10:46 AM

## 2017-10-14 NOTE — Discharge Summary (Signed)
Physician Discharge Summary  Richard Spears YPP:[id number] DOB: December 23, 1925 DOA: 10/09/2017  PCP: [name], MD  Admit date: 10/09/2017 Discharge date: 10/14/2017  Time spent: 25* minutes  Recommendations for Outpatient Follow-up:  1. Start Coumadin from 10/15/17 2. Check PT/INR on 10/17/17   Discharge Diagnoses:  Principal Problem:   Hip fracture ([place]) Active Problems:   S/P MVR (mitral valve replacement)   Paroxysmal atrial fibrillation [facility])   Essential hypertension   Discharge Condition: Stable  Diet recommendation: Heart healthy diet  Filed Weights   10/12/17 1233 10/13/17 0625 10/14/17 0532  Weight: 70.3 kg (155 lb) 72 kg (158 lb 11.7 oz) 76.5 kg (168 lb 10.4 oz)    History of present illness:   84 y.o.malewith medical history significant ofanemia, BPH, A. fib, mechanical mitral valve on warfarin, L hip replacementand several other medical problems who presents after mechanical fall with a right hip fracture. He notes that he was walking to the dining hall. He slipped on mud on the path and fell onto the grass hitting his right hip. He notes that he did not not hit any other part of his body. He did not hit his head. He did not have any loss of consciousness. He notes a dull pain to his hip which is sharp with movement. Patient was admitted for right hip fracture, orthopedic surgery consulted. He received vit K to reverse INR and planned for OR 4/17.     Hospital Course:  1. S/p right total hip arthroplasty-Ortho consulted for right femoral neck fracture, patient underwent total hip arthroplasty,  Coumadin has been restarted. 2. Mechanical mitral valve on Coumadin-INR goal is 2.5-3.5. Coumadin restarted. INR is 3.85 today, would recommend to hold coumadin tonight and start Coumadin from tomorrow. 3. Paroxysmal atrial fibrillation-heart rate is controlled, continue bisoprolol.  Anticoagulation with Coumadin 4. Hypertension-blood pressure stable, continue  isosorbide, hydralazine, bisoprolol 5. Anxiety-continue Ativan as needed.     Procedures:  Right total hip arthropalsty  Consultations:  Orthopedics  Discharge Exam: Vitals:   10/13/17 2009 10/14/17 0532  BP: (!) 159/85 (!) 152/77  Pulse: 83 82  Resp: 12 12  Temp: 97.6 F (36.4 C) 98.1 F (36.7 C)  SpO2: 98% 95%    General: Appears in no acute distress Cardiovascular: S1S2 RRR Respiratory: Clear bilaterally  Discharge Instructions   Discharge Instructions    Call MD / Call 911   Complete by:  As directed    If you experience chest pain or shortness of breath, CALL 911 and be transported to the hospital emergency room.  If you develope a fever above 101 F, pus (white drainage) or increased drainage or redness at the wound, or calf pain, call your surgeon's office.   Change dressing   Complete by:  As directed    You may change your dressing dressing daily with sterile 4 x 4 inch gauze dressing and paper tape.  Do not submerge the incision under water.   Diet - low sodium heart healthy   Complete by:  As directed    Discharge instructions   Complete by:  As directed    Resume his Coumadin Protocol as discharge  Pick up stool softner and laxative for home use following surgery while on pain medications. Do not submerge incision under water. Please use good hand washing techniques while changing dressing each day. May shower starting three days after surgery. Please use a clean towel to pat the incision dry following showers. Continue to use ice for pain and  swelling after surgery. Do not use any lotions or creams on the incision until instructed by your surgeon.  Wear both TED hose on both legs during the day every day for three weeks, but may remove the TED hose at night at home.  Postoperative Constipation Protocol  Constipation - defined medically as fewer than three stools per week and severe constipation as less than one stool per week.  One of the most  common issues patients have following surgery is constipation.  Even if you have a regular bowel pattern at home, your normal regimen is likely to be disrupted due to multiple reasons following surgery.  Combination of anesthesia, postoperative narcotics, change in appetite and fluid intake all can affect your bowels.  In order to avoid complications following surgery, here are some recommendations in order to help you during your recovery period.  Colace (docusate) - Pick up an over-the-counter form of Colace or another stool softener and take twice a day as long as you are requiring postoperative pain medications.  Take with a full glass of water daily.  If you experience loose stools or diarrhea, hold the colace until you stool forms back up.  If your symptoms do not get better within 1 week or if they get worse, check with your doctor.  Dulcolax (bisacodyl) - Pick up over-the-counter and take as directed by the product packaging as needed to assist with the movement of your bowels.  Take with a full glass of water.  Use this product as needed if not relieved by Colace only.   MiraLax (polyethylene glycol) - Pick up over-the-counter to have on hand.  MiraLax is a solution that will increase the amount of water in your bowels to assist with bowel movements.  Take as directed and can mix with a glass of water, juice, soda, coffee, or tea.  Take if you go more than two days without a movement. Do not use MiraLax more than once per day. Call your doctor if you are still constipated or irregular after using this medication for 7 days in a row.  If you continue to have problems with postoperative constipation, please contact the office for further assistance and recommendations.  If you experience "the worst abdominal pain ever" or develop nausea or vomiting, please contact the office immediatly for further recommendations for treatment.   Discharge instructions   Complete by:  As directed    Start Coumadin  from 10/15/17. Check PT/INR on 10/17/17   Do not sit on low chairs, stoools or toilet seats, as it may be difficult to get up from low surfaces   Complete by:  As directed    Driving restrictions   Complete by:  As directed    No driving until released by the physician.   Increase activity slowly   Complete by:  As directed    Increase activity slowly as tolerated   Complete by:  As directed    Lifting restrictions   Complete by:  As directed    No lifting until released by the physician.   Patient may shower   Complete by:  As directed    You may shower without a dressing once there is no drainage.  Do not wash over the wound.  If drainage remains, do not shower until drainage stops.   TED hose   Complete by:  As directed    Use stockings (TED hose) for 3 weeks on both leg(s).  You may remove them at night for  sleeping.   Weight bearing as tolerated   Complete by:  As directed      Allergies as of 10/14/2017      Reactions   Flexeril [cyclobenzaprine] Diarrhea      Medication List    STOP taking these medications   FLONASE 50 MCG/ACT nasal spray Generic drug:  fluticasone   ketoconazole 2 % shampoo Commonly known as:  NIZORAL     TAKE these medications   bisacodyl 10 MG suppository Commonly known as:  DULCOLAX Place 1 suppository (10 mg total) rectally daily as needed for moderate constipation.   bisoprolol 5 MG tablet Commonly known as:  ZEBETA Take 1 tablet (5 mg total) by mouth daily.   COUMADIN 5 MG tablet Generic drug:  warfarin Take as directed. If you are unsure how to take this medication, talk to your nurse or doctor. Original instructions:  Take 1 to 1 and 1/2 tablets daily as directed by coumadin clinic What changed:  Another medication with the same name was removed. Continue taking this medication, and follow the directions you see here.   docusate sodium 100 MG capsule Commonly known as:  COLACE Take 1 capsule (100 mg total) by mouth 2 (two) times  daily.   furosemide 40 MG tablet Commonly known as:  LASIX Take 1 tablet (40 mg total) by mouth daily and take an additional 0.5 tablet (20 mg total) by mouth 3 days a week.   hydrALAZINE 25 MG tablet Commonly known as:  APRESOLINE Take 1 tablet (25 mg total) by mouth daily at lunchtime. Take an additional tablet as needed for systolic blood pressure >081 mmHg   isosorbide mononitrate 30 MG 24 hr tablet Commonly known as:  IMDUR TAKE 1 TABLET DAILY What changed:    how much to take  how to take this  when to take this   LORazepam 1 MG tablet Commonly known as:  ATIVAN Take 1 tablet (1 mg total) by mouth every 8 (eight) hours as needed for anxiety.   methocarbamol 500 MG tablet Commonly known as:  ROBAXIN Take 1 tablet (500 mg total) by mouth every 6 (six) hours as needed for muscle spasms.   MULTIVITAMIN PO Take 1 tablet by mouth daily.   ondansetron 4 MG tablet Commonly known as:  ZOFRAN Take 1 tablet (4 mg total) by mouth every 6 (six) hours as needed for nausea.   oxyCODONE 5 MG immediate release tablet Commonly known as:  Oxy IR/ROXICODONE Take 1-2 tablets (5-10 mg total) by mouth every 4 (four) hours as needed for moderate pain or severe pain.   polyethylene glycol packet Commonly known as:  MIRALAX / GLYCOLAX Take 17 g by mouth daily as needed for mild constipation.   traMADol 50 MG tablet Commonly known as:  ULTRAM Take 1-2 tablets (50-100 mg total) by mouth every 6 (six) hours as needed (mild pain).            Discharge Care Instructions  (From admission, onward)        Start     Ordered   10/13/17 0000  Weight bearing as tolerated     10/13/17 0936   10/13/17 0000  Change dressing    Comments:  You may change your dressing dressing daily with sterile 4 x 4 inch gauze dressing and paper tape.  Do not submerge the incision under water.   10/13/17 0936     Allergies  Allergen Reactions  . Flexeril [Cyclobenzaprine] Diarrhea   Follow-up  Information  Gaynelle Arabian, MD. Schedule an appointment as soon as possible for a visit on 10/25/2017.   Specialty:  Orthopedic Surgery Contact information: [address] [place] [address] [phone]            The results of significant diagnostics from this hospitalization (including imaging, microbiology, ancillary and laboratory) are listed below for reference.    Significant Diagnostic Studies: Dg Chest 1 View  Result Date: 10/09/2017 CLINICAL DATA:  Pain following fall EXAM: CHEST  1 VIEW COMPARISON:  April 22, 2010 FINDINGS: There is no appreciable edema or consolidation. Heart is upper normal in size with pulmonary vascularity within normal limits. Patient is status post mitral valve replacement. There is aortic atherosclerosis. No adenopathy. No bone lesions. No pneumothorax. IMPRESSION: No edema or consolidation. No pneumothorax. Heart upper normal in size. Status post mitral valve replacement. Aortic atherosclerosis present. Aortic Atherosclerosis (ICD10-I70.0). Electronically Signed   By: Lowella Grip III M.D.   On: 10/09/2017 15:12   Dg Pelvis Portable  Result Date: 10/12/2017 CLINICAL DATA:  Right hip replacement today EXAM: PORTABLE PELVIS 1-2 VIEWS COMPARISON:  10/09/2017 FINDINGS: Right hip replacement in satisfactory position alignment. No immediate complication Chronic left hip replacement without radiographic abnormality IMPRESSION: Satisfactory right hip replacement for fracture Electronically Signed   By: Franchot Gallo M.D.   On: 10/12/2017 17:20   Dg C-arm 1-60 Min-no Report  Result Date: 10/12/2017 Fluoroscopy was utilized by the requesting physician.  No radiographic interpretation.   Dg Hip Unilat With Pelvis 2-3 Views Right  Result Date: 10/09/2017 CLINICAL DATA:  Pain following fall EXAM: DG HIP (WITH OR WITHOUT PELVIS) 2-3V RIGHT COMPARISON:  None. FINDINGS: Frontal pelvis as well as frontal and lateral right hip images were  obtained. There is a subcapital femoral neck fracture on the right with varus angulation at the fracture site. There is a total hip replacement left with prosthetic components well-seated. No other fracture. No dislocation. There is moderate narrowing of the right hip joint. IMPRESSION: Subcapital femoral neck fracture on the right with varus angulation at the fracture site. Total hip replacement on the left with prosthetic components appearing well-seated. No dislocation. Moderate narrowing right hip joint. Electronically Signed   By: Lowella Grip III M.D.   On: 10/09/2017 15:11    Microbiology: Recent Results (from the past 240 hour(s))  Surgical PCR screen     Status: None   Collection Time: 10/09/17  8:08 PM  Result Value Ref Range Status   MRSA, PCR NEGATIVE NEGATIVE Final   Staphylococcus aureus NEGATIVE NEGATIVE Final    Comment: (NOTE) The Xpert SA Assay (FDA approved for NASAL specimens in patients 10 years of age and older), is one component of a comprehensive surveillance program. It is not intended to diagnose infection nor to guide or monitor treatment. Performed at [facility], [place] [address] [place], [place] [address]      Labs: Basic Metabolic Panel: Recent Labs  Lab 10/09/17 1501 10/09/17 2030 10/10/17 0421 10/11/17 0503 10/12/17 0456 10/14/17 0534  NA 135  --  132* 133* 135 131*  K 4.5  --  4.5 4.3 4.3 4.7  CL 99*  --  99* 102 102 103  CO2 23  --  26 24 25  21*  GLUCOSE 111*  --  123* 89 87 122*  BUN 26*  --  23* 21* 17 22*  CREATININE 1.26*  --  1.16 1.09 1.01 0.98  CALCIUM 9.0  --  8.6* 8.6* 9.0 8.1*  MG  --  2.3 2.1  --   --   --    Liver Function Tests: Recent Labs  Lab 10/09/17 1501 10/10/17 0421  AST 39 36  ALT 21 23  ALKPHOS 59 49  BILITOT 1.3* 1.4*  PROT 7.1 6.2*  ALBUMIN 4.2 3.5   No results for input(s): LIPASE, AMYLASE in the last 168 hours. No results for input(s): AMMONIA in the last 168 hours. CBC: Recent  Labs  Lab 10/09/17 1501 10/10/17 0421 10/11/17 0503 10/12/17 0456 10/13/17 0832 10/14/17 0534  WBC 3.7* 4.3 3.5* 3.7* 3.3* 4.3  NEUTROABS 3.0  --   --   --   --   --   HGB 12.5* 12.3* 12.2* 12.6* 10.1* 9.7*  HCT 36.6* 36.3* 36.3* 36.8* 29.3* 28.5*  MCV 98.4 98.6 99.7 98.7 99.0 96.9  PLT 134* 120* 112* 107* 105* 114*       Signed:  Oswald Hillock MD.  [facility] 10/14/2017, 11:45 AM

## 2017-10-14 NOTE — Clinical Social Work Placement (Signed)
Patient received and accepted bed offer at [facility]. Facility aware of patient's discharge and confirmed bed offer. PTAR contacted, patient's family notified. Patient's RN can call report to [phone] to nurse for Room N40, packet complete. CSW signing off, no other needs identified at this time.  CLINICAL SOCIAL WORK PLACEMENT  NOTE  Date:  10/14/2017  Patient Details  Name: Richard Spears MRN: [id number] Date of Birth: Oct 09, 1925  Clinical Social Work is seeking post-discharge placement for this patient at the [place] level of care (*CSW will initial, date and re-position this form in  chart as items are completed):  Yes   Patient/family provided with [place] Work Department's list of facilities offering this level of care within the geographic area requested by the patient (or if unable, by the patient's family).  Yes   Patient/family informed of their freedom to choose among providers that offer the needed level of care, that participate in Medicare, Medicaid or managed care program needed by the patient, have an available bed and are willing to accept the patient.  Yes   Patient/family informed of [facility]'s ownership interest in [facility] and [facility], as well as of the fact that they are under no obligation to receive care at these facilities.  PASRR submitted to EDS on 10/13/17     PASRR number received on 10/13/17     Existing PASRR number confirmed on       FL2 transmitted to all facilities in geographic area requested by pt/family on 10/13/17     FL2 transmitted to all facilities within larger geographic area on       Patient informed that his/her managed care company has contracts with or will negotiate with certain facilities, including the following:        Yes   Patient/family informed of bed offers received.  Patient chooses bed at [facility]     Physician recommends and patient  chooses bed at      Patient to be transferred to [facility] on 10/14/17.  Patient to be transferred to facility by PTAR     Patient family notified on 10/14/17 of transfer.  Name of family member notified:  Son - [facility]     PHYSICIAN       Additional Comment:    _______________________________________________ Burnis Medin, LCSW 10/14/2017, 2:09 PM

## 2017-10-14 NOTE — Progress Notes (Signed)
ANTICOAGULATION CONSULT NOTE - Follow Up Consult  Pharmacy Consult for Warfarin Indication: atrial fibrillation, mechanical mitral valve (PTA warfarin held)  Allergies  Allergen Reactions  . Flexeril [Cyclobenzaprine] Diarrhea    Patient Measurements: Height: 6' (182.9 cm) Weight: 168 lb 10.4 oz (76.5 kg) IBW/kg (Calculated) : 77.6  Vital Signs: Temp: 98.1 F (36.7 C) (04/19 0532) BP: 152/77 (04/19 0532) Pulse Rate: 82 (04/19 0532)  Labs: Recent Labs    10/12/17 0456 10/12/17 1243 10/13/17 0525 10/13/17 0832 10/14/17 0534  HGB 12.6*  --   --  10.1* 9.7*  HCT 36.8*  --   --  29.3* 28.5*  PLT 107*  --   --  105* 114*  APTT  --  50*  --   --   --   LABPROT 20.0* 19.0* 23.8*  --  37.5*  INR 1.72 1.60 2.15  --  3.85  HEPARINUNFRC 0.23*  --   --   --   --   CREATININE 1.01  --   --   --  0.98    Estimated Creatinine Clearance: 54.2 mL/min (by C-G formula based on SCr of 0.98 mg/dL).   Medications:  Medications Prior to Admission  Medication Sig Dispense Refill Last Dose  . bisoprolol (ZEBETA) 5 MG tablet Take 1 tablet (5 mg total) by mouth daily. 90 tablet 3 10/08/2017 at Unknown time  . COUMADIN 5 MG tablet Take 1 to 1 and 1/2 tablets daily as directed by coumadin clinic 120 tablet 1 10/08/2017 at Unknown time  . fluticasone (FLONASE) 50 MCG/ACT nasal spray Place 2 sprays into both nostrils as needed for allergies or rhinitis.   Past Month at Unknown time  . furosemide (LASIX) 40 MG tablet Take 1 tablet (40 mg total) by mouth daily and take an additional 0.5 tablet (20 mg total) by mouth 3 days a week. 135 tablet 3 10/09/2017 at Unknown time  . hydrALAZINE (APRESOLINE) 25 MG tablet Take 1 tablet (25 mg total) by mouth daily at lunchtime. Take an additional tablet as needed for systolic blood pressure >865 mmHg 135 tablet 3 10/08/2017 at Unknown time  . isosorbide mononitrate (IMDUR) 30 MG 24 hr tablet TAKE 1 TABLET DAILY (Patient taking differently: TAKE 1 TABLET (30 mg)  DAILY) 90 tablet 0 10/08/2017 at Unknown time  . ketoconazole (NIZORAL) 2 % shampoo Apply 1 application topically 2 (two) times a week.   Past Week at Unknown time  . LORazepam (ATIVAN) 1 MG tablet Take 1 mg by mouth every 8 (eight) hours as needed for anxiety.    10/08/2017 at Unknown time  . Multiple Vitamins-Minerals (MULTIVITAMIN PO) Take 1 tablet by mouth daily.   10/09/2017 at Unknown time  . warfarin (COUMADIN) 5 MG tablet Take 1 to 1.5 tablets by mouth daily as directed (Patient not taking: Reported on 10/09/2017) 120 tablet 1 Not Taking at Unknown time   Scheduled:  . bisoprolol  5 mg Oral Daily  . docusate sodium  100 mg Oral BID  . enoxaparin (LOVENOX) injection  40 mg Subcutaneous Q24H  . hydrALAZINE  25 mg Oral Q lunch  . isosorbide mononitrate  30 mg Oral Daily  . polyethylene glycol  17 g Oral BID  . senna  1 tablet Oral BID  . Warfarin - Pharmacist Dosing Inpatient   Does not apply q1800   Infusions:  . methocarbamol (ROBAXIN)  IV Stopped (10/12/17 1705)    Assessment: 59 yoM [facility] ED [facility] 4[facility]14 with right hip fracture.  PMH is significant for chronic warfarin anticoagulation for afib and mechanical mitral valve replacement. Vit K given to reverse INR prior to planned surgery.  Pharmacy is consulted to dose Heparin perioperatively.   PTA warfarin 5mg  daily except 7.5 mg on Sun/Wed/Fridays.  Last dose taken on 10/08/17.  INR on admission is subtherapeutic at 2.39 (goal INR 2.5-3.5)  Significant Events:  4/17 R THA; resuming warfarin POD0  Today, 10/14/2017  INR now supratherapeutic this AM, likely d/t warfarin 10 mg given on 4/17 (goal is 2.5 - 3.5)  CBC: Hgb trending down slightly as expected postop; Plt low but stable > 100k  No bleeding or complications reported  Goal of Therapy:  INR 2.5-3.5 with mechanical mitral valve replacement Monitor platelets by anticoagulation protocol: Yes   Plan:   Hold warfarin tonight  Will discontinue Lovenox with INR now  > 2.5  For discharge - would hold today's warfarin and continue with 5 mg daily starting tomorrow with INR check on Monday (can revert to home regimen with 7.5 mg on Sun, Wed, Fri once INR has trended back down to near goal range)  Daily PT/INR  Monitor for signs/symptoms of bleeding or thrombosis  Reuel Boom, PharmD, BCPS [phone] 10/14/2017, 11:04 AM

## 2017-10-14 NOTE — Progress Notes (Signed)
[place], [phone], gave report to Pilar Jarvis to transport to facility

## 2017-10-17 ENCOUNTER — Non-Acute Institutional Stay (SKILLED_NURSING_FACILITY): Payer: Medicare Other | Admitting: Internal Medicine

## 2017-10-17 ENCOUNTER — Encounter: Payer: Self-pay | Admitting: Internal Medicine

## 2017-10-17 DIAGNOSIS — I10 Essential (primary) hypertension: Secondary | ICD-10-CM

## 2017-10-17 DIAGNOSIS — E871 Hypo-osmolality and hyponatremia: Secondary | ICD-10-CM

## 2017-10-17 DIAGNOSIS — M62838 Other muscle spasm: Secondary | ICD-10-CM

## 2017-10-17 DIAGNOSIS — R2681 Unsteadiness on feet: Secondary | ICD-10-CM | POA: Diagnosis not present

## 2017-10-17 DIAGNOSIS — S72001D Fracture of unspecified part of neck of right femur, subsequent encounter for closed fracture with routine healing: Secondary | ICD-10-CM | POA: Diagnosis not present

## 2017-10-17 DIAGNOSIS — Z952 Presence of prosthetic heart valve: Secondary | ICD-10-CM | POA: Diagnosis not present

## 2017-10-17 DIAGNOSIS — K5900 Constipation, unspecified: Secondary | ICD-10-CM

## 2017-10-17 DIAGNOSIS — D500 Iron deficiency anemia secondary to blood loss (chronic): Secondary | ICD-10-CM

## 2017-10-17 DIAGNOSIS — I872 Venous insufficiency (chronic) (peripheral): Secondary | ICD-10-CM

## 2017-10-17 DIAGNOSIS — R5381 Other malaise: Secondary | ICD-10-CM | POA: Diagnosis not present

## 2017-10-17 DIAGNOSIS — I480 Paroxysmal atrial fibrillation: Secondary | ICD-10-CM

## 2017-10-17 NOTE — Progress Notes (Signed)
Provider:  Blanchie Serve MD  Location:  [place] Room Number: 32 Place of Service:  SNF (31)  PCP: [name], MD Patient Care Team: [name], MD as PCP - General (Internal Medicine)  Extended Emergency Contact Information Primary Emergency Contact: [facility] Address: [address]          [place]  [place] Home Phone: [phone] Relation: None  Code Status: full code  Goals of Care: Advanced Directive information Advanced Directives 10/17/2017  Does Patient Have a Medical Advance Directive? Yes  Type of Paramedic of [place];Living will  Does patient want to make changes to medical advance directive? No - Patient declined  Copy of [place] in Chart? No - copy requested      Chief Complaint  Patient presents with  . New Admit To SNF    New Admission Visit     HPI: Patient is a 84 y.o. male seen today for admission visit. He was in the hospital from 10/09/17-10/14/17 post fall with right femoral neck fracture. He underwent right total hip arthroplasty. He was residing in independent living facility at [facility] prior to the fall. He has medical history of BPH, afib, mechanical mitral valve on long term anticoagulation, history of left hip replacement among others. He is seen in his room today. His pain is under control at rest, movement worsens it and he is experiencing muscle spasms mainly at night interrupting his sleep. He had been constipated and had a small bowel movement this am.   Past Medical History:  Diagnosis Date  . Anemia    leakoppenia  . BPH (benign prostatic hypertrophy)   . Bullous pemphigoid    [name], March 2011, right forearm squamous cell carcinoma  . Chronic anticoagulation    systemic  . Colon polyp    transverse, 2002  . History of peptic ulcer    remote, 3/95  . Hx of actinic keratosis   . Hx of basal cell carcinoma   . Hx of squamous cell carcinoma of  skin   . Hyperlipidemia   . Left inguinal hernia   . Moderate aortic insufficiency 2009   audible aortic insufficiency on 1/09 echo  . PAF (paroxysmal atrial fibrillation) ([place]) 01/17/2014   On Warfarin.  . S/P mitral valve replacement with metallic valve [id number]/5784   INR goal 2.5-3.5, St Jude,   . 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 Electrodesiccation and Curettage and Shave Biopsy Right    Right medial, anterio tibia: Well differentiated Squamous Cell  . hip replacements Left    10 years ago  . MITRAL VALVE REPLACEMENT  03/1996   St. Jude mechanical valve  . TOTAL HIP ARTHROPLASTY Right 10/12/2017   Procedure: RIGHT TOTAL HIP ARTHROPLASTY ANTERIOR APPROACH;  Surgeon: Gaynelle Arabian, MD;  Location: [facility];  Service: Orthopedics;  Laterality: Right;  . TRANSTHORACIC ECHOCARDIOGRAM  01/19/2011   EF 60-65%. No regional wall motion abnormality. Mechanical mitral valve in place. Moderate aortic regurgitation.  . TRANSTHORACIC ECHOCARDIOGRAM  08/'17; 10/'18    a) Mild conc LVH. EF 55-60%. No RWMA. Mod AI. Mechanical MV prosthesis functioning properly. LAD dilation.;; b)  EF 55-60%.  Mo AI.  Bileaflet Saint Jude mechanical MV with no paravalvular leak.  Severe LA dilation.  Minimally elevated PAP    reports that he quit smoking about 63 years ago.  His smoking use included cigarettes. He has a 8.00 pack-year smoking history. He has never used smokeless tobacco. He reports that he drinks alcohol. He reports that he does not use drugs. Social History   Socioeconomic History  . Marital status: Married    Spouse name: Not on file  . Number of children: 2  . Years of education: Not on file  . Highest education level: Not on file  Occupational History  . Occupation: retired  Scientific laboratory technician  . Financial resource strain: Not on file  . Food insecurity:    Worry: Not on file    Inability: Not on file  .  Transportation needs:    Medical: Not on file    Non-medical: Not on file  Tobacco Use  . Smoking status: Former Smoker    Packs/day: 1.00    Years: 8.00    Pack years: 8.00    Types: Cigarettes    Last attempt to quit: 10/31/1954    Years since quitting: 63.0  . Smokeless tobacco: Never Used  Substance and Sexual Activity  . Alcohol use: Yes    Alcohol/week: 0.0 oz    Comment: 1-2 drinks per day  . Drug use: Never  . Sexual activity: Not on file  Lifestyle  . Physical activity:    Days per week: Not on file    Minutes per session: Not on file  . Stress: Not on file  Relationships  . Social connections:    Talks on phone: Not on file    Gets together: Not on file    Attends religious service: Not on file    Active member of club or organization: Not on file    Attends meetings of clubs or organizations: Not on file    Relationship status: Not on file  . Intimate partner violence:    Fear of current or ex partner: Not on file    Emotionally abused: Not on file    Physically abused: Not on file    Forced sexual activity: Not on file  Other Topics Concern  . Not on file  Social History Narrative   Patient lives at [facility], With his wife.    Functional Status Survey:    Family History  Problem Relation Age of Onset  . Hypertension Mother   . Lung cancer Sister   . COPD Brother   . Cancer Brother   . Other Sister        polio  . Lupus Son     Health Maintenance  Topic Date Due  . INFLUENZA VACCINE  01/26/2018  . TETANUS/TDAP  06/10/2024  . PNA vac Low Risk Adult  Completed    Allergies  Allergen Reactions  . Flexeril [Cyclobenzaprine] Diarrhea    Outpatient Encounter Medications as of 10/17/2017  Medication Sig  . bisacodyl (DULCOLAX) 10 MG suppository Place 1 suppository (10 mg total) rectally daily as needed for moderate constipation.  . bisoprolol (ZEBETA) 5 MG tablet Take 1 tablet (5 mg total) by mouth daily.  Marland Kitchen docusate sodium (COLACE)  100 MG capsule Take 1 capsule (100 mg total) by mouth 2 (two) times daily.  . furosemide (LASIX) 40 MG tablet Take 1 tablet (40 mg total) by mouth daily and take an additional 0.5 tablet (20 mg total) by mouth 3 days a week.  . hydrALAZINE (APRESOLINE) 25 MG tablet Take 1 tablet (25 mg total) by mouth daily at lunchtime. Take an additional tablet as needed for systolic blood pressure >409  mmHg  . isosorbide mononitrate (IMDUR) 30 MG 24 hr tablet TAKE 1 TABLET DAILY  . LORazepam (ATIVAN) 1 MG tablet Take 1 tablet (1 mg total) by mouth every 8 (eight) hours as needed for anxiety.  . methocarbamol (ROBAXIN) 500 MG tablet Take 1 tablet (500 mg total) by mouth every 6 (six) hours as needed for muscle spasms.  . Multiple Vitamins-Minerals (MULTIVITAMIN PO) Take 1 tablet by mouth daily.  . ondansetron (ZOFRAN) 4 MG tablet Take 1 tablet (4 mg total) by mouth every 6 (six) hours as needed for nausea.  Marland Kitchen oxyCODONE (OXY IR/ROXICODONE) 5 MG immediate release tablet Take 1-2 tablets (5-10 mg total) by mouth every 4 (four) hours as needed for moderate pain or severe pain.  . polyethylene glycol (MIRALAX / GLYCOLAX) packet Take 17 g by mouth daily as needed for mild constipation.  . traMADol (ULTRAM) 50 MG tablet Take 50 mg by mouth every 6 (six) hours as needed for moderate pain.  Marland Kitchen warfarin (COUMADIN) 5 MG tablet Take 5 mg by mouth daily.  . [DISCONTINUED] COUMADIN 5 MG tablet Take 1 to 1 and 1/2 tablets daily as directed by coumadin clinic  . [DISCONTINUED] traMADol (ULTRAM) 50 MG tablet Take 1-2 tablets (50-100 mg total) by mouth every 6 (six) hours as needed (mild pain).   No facility-administered encounter medications on file as of 10/17/2017.     Review of Systems  Constitutional: Negative for appetite change, chills, diaphoresis and fever.  HENT: Negative for congestion, ear pain, hearing loss, mouth sores, postnasal drip, rhinorrhea, sore throat and trouble swallowing.   Eyes: Negative for discharge.    Respiratory: Negative for cough, chest tightness, shortness of breath and wheezing.   Cardiovascular: Negative for chest pain, palpitations and leg swelling.  Gastrointestinal: Positive for constipation. Negative for abdominal pain, blood in stool, diarrhea, nausea and vomiting.  Genitourinary: Negative for dysuria, flank pain and hematuria.  Musculoskeletal: Positive for arthralgias and gait problem. Negative for back pain.  Skin: Negative for rash and wound.  Neurological: Negative for seizures, light-headedness, numbness and headaches.  Hematological: Bruises/bleeds easily.  Psychiatric/Behavioral: Negative for behavioral problems, confusion, dysphoric mood and sleep disturbance. The patient is not nervous/anxious.     Vitals:   10/17/17 1055  BP: 113/60  Pulse: 78  Resp: 20  Temp: 98.1 F (36.7 C)  TempSrc: Oral  SpO2: 95%  Height: 6' (1.829 m)   Body mass index is 22.87 kg/m.   Wt Readings from Last 3 Encounters:  10/14/17 168 lb 10.4 oz (76.5 kg)  07/27/17 161 lb 6.4 oz (73.2 kg)  05/12/17 159 lb (72.1 kg)   Physical Exam  Constitutional: He is oriented to person, place, and time. He appears well-developed and well-nourished. No distress.  HENT:  Head: Normocephalic and atraumatic.  Right Ear: External ear normal.  Left Ear: External ear normal.  Nose: Nose normal.  Mouth/Throat: Oropharynx is clear and moist. No oropharyngeal exudate.  Eyes: Pupils are equal, round, and reactive to light. Conjunctivae and EOM are normal. Right eye exhibits no discharge. Left eye exhibits no discharge.  Neck: Normal range of motion. Neck supple. No thyromegaly present.  Cardiovascular: Normal rate, regular rhythm and intact distal pulses.  Murmur heard. Pulmonary/Chest: Effort normal and breath sounds normal. No respiratory distress. He has no wheezes. He has no rales. He exhibits no tenderness.  Abdominal: He exhibits no distension. There is no tenderness. There is no rebound and  no guarding.  Musculoskeletal: He exhibits edema and tenderness.  Right hip ROM limited, good ROM with other extremities, arthritis changes to fingers, unsteady gait, trace right leg edema  Lymphadenopathy:    He has no cervical adenopathy.  Neurological: He is alert and oriented to person, place, and time. No sensory deficit. He exhibits normal muscle tone.  Skin: Skin is warm and dry. Capillary refill takes 2 to 3 seconds. He is not diaphoretic.  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 His behavior is normal.    Labs reviewed: Basic Metabolic Panel: Recent Labs    10/09/17 2030 10/10/17 0421 10/11/17 0503 10/12/17 0456 10/14/17 0534  NA  --  132* 133* 135 131*  K  --  4.5 4.3 4.3 4.7  CL  --  99* 102 102 103  CO2  --  26 24 25  21*  GLUCOSE  --  123* 89 87 122*  BUN  --  23* 21* 17 22*  CREATININE  --  1.16 1.09 1.01 0.98  CALCIUM  --  8.6* 8.6* 9.0 8.1*  MG 2.3 2.1  --   --   --    Liver Function Tests: Recent Labs    10/09/17 1501 10/10/17 0421  AST 39 36  ALT 21 23  ALKPHOS 59 49  BILITOT 1.3* 1.4*  PROT 7.1 6.2*  ALBUMIN 4.2 3.5   No results for input(s): LIPASE, AMYLASE in the last 8760 hours. No results for input(s): AMMONIA in the last 8760 hours. CBC: Recent Labs    10/09/17 1501  10/12/17 0456 10/13/17 0832 10/14/17 0534  WBC 3.7*   < > 3.7* 3.3* 4.3  NEUTROABS 3.0  --   --   --   --   HGB 12.5*   < > 12.6* 10.1* 9.7*  HCT 36.6*   < > 36.8* 29.3* 28.5*  MCV 98.4   < > 98.7 99.0 96.9  PLT 134*   < > 107* 105* 114*   < > = values in this interval not displayed.   Cardiac Enzymes: No results for input(s): CKTOTAL, CKMB, CKMBINDEX, TROPONINI in the last 8760 hours. BNP: Invalid input(s): POCBNP No results found for: HGBA1C No results found for: TSH No results found for: VITAMINB12 No results found for: FOLATE No results  found for: IRON, TIBC, FERRITIN  Imaging and Procedures obtained prior to SNF admission: Dg Chest 1 View  Result Date: 10/09/2017 CLINICAL DATA:  Pain following fall EXAM: CHEST  1 VIEW COMPARISON:  April 22, 2010 FINDINGS: There is no appreciable edema or consolidation. Heart is upper normal in size with pulmonary vascularity within normal limits. Patient is status post mitral valve replacement. There is aortic atherosclerosis. No adenopathy. No bone lesions. No pneumothorax. IMPRESSION: No edema or consolidation. No pneumothorax. Heart upper normal in size. Status post mitral valve replacement. Aortic atherosclerosis present. Aortic Atherosclerosis (ICD10-I70.0). Electronically Signed   By: Lowella Grip III M.D.   On: 10/09/2017 15:12   Dg Hip Unilat With Pelvis 2-3 Views Right  Result Date: 10/09/2017 CLINICAL DATA:  Pain following fall EXAM: DG HIP (WITH OR WITHOUT PELVIS) 2-3V RIGHT COMPARISON:  None. FINDINGS: Frontal pelvis as well as frontal and lateral right hip images were obtained. There is a subcapital femoral neck fracture on the right with varus angulation at the fracture site. There is a total hip replacement left with prosthetic components well-seated. No other fracture. No dislocation. There is  moderate narrowing of the right hip joint. IMPRESSION: Subcapital femoral neck fracture on the right with varus angulation at the fracture site. Total hip replacement on the left with prosthetic components appearing well-seated. No dislocation. Moderate narrowing right hip joint. Electronically Signed   By: Lowella Grip III M.D.   On: 10/09/2017 15:11    Assessment/Plan  1. Physical deconditioning Will have patient work with PT/OT as tolerated to regain strength and restore function.  Fall precautions are in place.  2. Unsteady gait Will have him work with physical therapy and occupational therapy team to help with gait training and muscle strengthening exercises.fall  precautions. Skin care. Encourage to be out of bed. Goal is for him to return home. Patient was not using any assistive device for ambulation at home.   3. Closed fracture of right hip with routine healing, subsequent encounter WBAT to RLE. Will need orthopedic follow up in 2 weeks. Patient does not want oxyIR with side effect. D/c oxyIR. Currently on tramadol 50 mg q6h prn pain. Change this to 50 mg 1-2 tablet q6h prn pain with 2 tablet at bedtime for better pain control. Reassess pain in 1 week. Provide surgical incision dressing care.   4. Hyponatremia Check bmp, maintain hydration. On lasix.   5. Muscle spasm Currently on robaxin 500 mg q6h prn, will change this to q8h prn and add 500 mg qhs to help with spasm. Monitor.   6. Blood loss anemia Post op, check cbc  7. Paroxysmal atrial fibrillation (HCC) Controlled HR, continue bisoprolol 5 mg daily  8. Venous insufficiency On lasix 40 mg daily with additional 20 mg 3 days a week. Continue ted hose. Skin care.   9. Essential hypertension Controlled BP, monitor with pain, continue imdur 30 mg daily, bisoprolol 5 mg daily, hydralazine 25 mg daily. Check BMP  10. S/P MVR (mitral valve replacement) INR today 2.7. Goal INR 2.5-3.5. Currently on warfarin 4 mg daily. Was taking 5 mg alternating with 7.5 mg at home. Change coumadin to 5 mg daily for now and check INR 10/20/17.   11. Constipation, unspecified constipation type Continue colace 100 mg bid, miralax daily as needed and dulcolax suppository daily as needed. Encouraged hydration. Add prune juice daily in the morning for now.    Family/ staff Communication: reviewed care plan with patient and charge nurse.    Labs/tests ordered: CBC, BMP 10/20/17  Blanchie Serve, MD Internal Medicine [facility] [address] [place], [place] [address] Cell Phone (Monday-Friday 8 am - 5 pm): [phone] On Call: [phone] and follow prompts after 5 pm and  on weekends Office Phone: [phone] Office Fax: [phone]

## 2017-10-20 LAB — BASIC METABOLIC PANEL
BUN: 25 — AB (ref 4–21)
Calcium: 8.6
Creat: 1.38
GLUCOSE: 85
Potassium: 4.2
Sodium: 130

## 2017-10-20 LAB — CBC
HCT: 26.3
HEMOGLOBIN: 9.2
WBC: 3.5
platelet count: 243

## 2017-10-21 ENCOUNTER — Encounter: Payer: Self-pay | Admitting: *Deleted

## 2017-10-25 ENCOUNTER — Telehealth: Payer: Self-pay | Admitting: Cardiology

## 2017-10-25 NOTE — Telephone Encounter (Signed)
New Message:      Pt's wife is calling due to husband's INR being 1.5. Pt is currently in nursing facility and pt's wife states they don't know what they are doing when it comes to the pt's INR. She would like to see if the pt can come and have his INR checked some time today. Pt has a f/u appt with another dr at 2 so sometime before or after that.

## 2017-10-25 NOTE — Telephone Encounter (Signed)
Spoke with wife.  She and patient are concerned about his low INR drawn at the facility today (1.5).  She wants to bring him here for another check to verify if correct.  Explained that cannot do two checks on same day.    Spoke with facility.  INR this AM was 1.5, the MD for the facility has not made any dose adjustments yet for the day.  He was given 3 mg Sunday and Monday by on call provider.   Explained to RN that his does previous was 5 mg qd x 7.5 mg tiw.  Asked her to have provider call with any questions or concerns.  Patient INR goal is 2.5-3.5 due to mechanical valve as well as AF.

## 2017-10-28 ENCOUNTER — Telehealth: Payer: Self-pay | Admitting: Cardiology

## 2017-10-28 ENCOUNTER — Non-Acute Institutional Stay (SKILLED_NURSING_FACILITY): Payer: Medicare Other | Admitting: Family

## 2017-10-28 ENCOUNTER — Encounter: Payer: Self-pay | Admitting: Family

## 2017-10-28 DIAGNOSIS — Z952 Presence of prosthetic heart valve: Secondary | ICD-10-CM | POA: Diagnosis not present

## 2017-10-28 DIAGNOSIS — M62838 Other muscle spasm: Secondary | ICD-10-CM

## 2017-10-28 DIAGNOSIS — R2681 Unsteadiness on feet: Secondary | ICD-10-CM

## 2017-10-28 DIAGNOSIS — K5901 Slow transit constipation: Secondary | ICD-10-CM

## 2017-10-28 DIAGNOSIS — I872 Venous insufficiency (chronic) (peripheral): Secondary | ICD-10-CM | POA: Diagnosis not present

## 2017-10-28 DIAGNOSIS — S72001D Fracture of unspecified part of neck of right femur, subsequent encounter for closed fracture with routine healing: Secondary | ICD-10-CM | POA: Diagnosis not present

## 2017-10-28 DIAGNOSIS — I480 Paroxysmal atrial fibrillation: Secondary | ICD-10-CM

## 2017-10-28 NOTE — Progress Notes (Signed)
Location:  [place] Room Number: 40 Place of Service:  SNF [phone])  Provider: Marlowe Sax FNP-C   PCP: [name], MD Patient Care Team: [name], MD as PCP - General (Internal Medicine)  Extended Emergency Contact Information Primary Emergency Contact: [facility] Address: [address]          [place] Home Phone: [phone] Relation: None  Code Status: Full Code  Goals of care:  Advanced Directive information Advanced Directives 10/17/2017  Does Patient Have a Medical Advance Directive? Yes  Type of Paramedic of [place];Living will  Does patient want to make changes to medical advance directive? No - Patient declined  Copy of [place] in Chart? No - copy requested     Allergies  Allergen Reactions  . Flexeril [Cyclobenzaprine] Diarrhea    Chief Complaint  Patient presents with  . Discharge Note    back to [place]    HPI:  84 y.o. male seen today at [facility] for discharge back to [place].He was here for short term rehabilitation for post hospital admission from 4/07/01/2017-10/14/2017 post fall with right femoral neck fracture.He is status post right total hip arthroplasty.He has a medical history of HTN,Afib,mechanical mitral valve on long term anticoagulation managed by cardiology coumadin clinic,venous insufficiency among other condition.she is seen in his room today with facility nurse supervisor present at bedside.He denies any acute issues during visit.He has had unremarkable stay here in rehab.He was seen by Orthopedic specialist DR.Pilar Plate Aluisio recommended patient to continue with Physical therapy and discontinue ted hose.Patient states has been wearing Ted hose for many years for venous insufficiency and does not want to discontinue use for now.    He has worked well with PT/OT now stable for discharge back to independent living.He will be discharged with  PT/OT to continue with ROM, Exercise, Gait stability and muscle strengthening.He does not require any DME has own front wheel walker. Discharge process will be arranged by facility social worker prior to discharge.He will be discharge from the facility with his Prescription medication will be written x 1 month then patient to follow up with PCP in 1-2 weeks.He denies any acute issues during this visit.Facility staff report no new concerns.He states right hip pain under control with current pain regimen tends to have pain and spasm especially at bedtime.   Past Medical History:  Diagnosis Date  . Anemia    leakoppenia  . BPH (benign prostatic hypertrophy)   . Bullous pemphigoid    [name], March 2011, right forearm squamous cell carcinoma  . Chronic anticoagulation    systemic  . Colon polyp    transverse, 2002  . History of peptic ulcer    remote, 3/95  . Hx of actinic keratosis   . Hx of basal cell carcinoma   . Hx of squamous cell carcinoma of skin   . Hyperlipidemia   . Left inguinal hernia   . Moderate aortic insufficiency 2009   audible aortic insufficiency on 1/09 echo  . PAF (paroxysmal atrial fibrillation) ([place]) 01/17/2014   On Warfarin.  . S/P mitral valve replacement with metallic valve 18/5631   INR goal 2.5-3.5, St Jude,   . 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 Electrodesiccation and Curettage and Shave Biopsy Right    Right medial, anterio tibia: Well differentiated Squamous Cell  .  hip replacements Left    10 years ago  . MITRAL VALVE REPLACEMENT  03/1996   St. Jude mechanical valve  . TOTAL HIP ARTHROPLASTY Right 10/12/2017   Procedure: RIGHT TOTAL HIP ARTHROPLASTY ANTERIOR APPROACH;  Surgeon: Gaynelle Arabian, MD;  Location: [facility]S;  Service: Orthopedics;  Laterality: Right;  . TRANSTHORACIC ECHOCARDIOGRAM  01/19/2011   EF 60-65%. No regional wall motion abnormality.  Mechanical mitral valve in place. Moderate aortic regurgitation.  . TRANSTHORACIC ECHOCARDIOGRAM  08/'17; 10/'18    a) Mild conc LVH. EF 55-60%. No RWMA. Mod AI. Mechanical MV prosthesis functioning properly. LAD dilation.;; b)  EF 55-60%.  Mo AI.  Bileaflet Saint Jude mechanical MV with no paravalvular leak.  Severe LA dilation.  Minimally elevated PAP      reports that he quit smoking about 63 years ago. His smoking use included cigarettes. He has a 8.00 pack-year smoking history. He has never used smokeless tobacco. He reports that he drinks alcohol. He reports that he does not use drugs. Social History   Socioeconomic History  . Marital status: Married    Spouse name: Not on file  . Number of children: 2  . Years of education: Not on file  . Highest education level: Not on file  Occupational History  . Occupation: retired  Scientific laboratory technician  . Financial resource strain: Not on file  . Food insecurity:    Worry: Not on file    Inability: Not on file  . Transportation needs:    Medical: Not on file    Non-medical: Not on file  Tobacco Use  . Smoking status: Former Smoker    Packs/day: 1.00    Years: 8.00    Pack years: 8.00    Types: Cigarettes    Last attempt to quit: 10/31/1954    Years since quitting: 63.0  . Smokeless tobacco: Never Used  Substance and Sexual Activity  . Alcohol use: Yes    Alcohol/week: 0.0 oz    Comment: 1-2 drinks per day  . Drug use: Never  . Sexual activity: Not on file  Lifestyle  . Physical activity:    Days per week: Not on file    Minutes per session: Not on file  . Stress: Not on file  Relationships  . Social connections:    Talks on phone: Not on file    Gets together: Not on file    Attends religious service: Not on file    Active member of club or organization: Not on file    Attends meetings of clubs or organizations: Not on file    Relationship status: Not on file  . Intimate partner violence:    Fear of current or ex partner: Not on  file    Emotionally abused: Not on file    Physically abused: Not on file    Forced sexual activity: Not on file  Other Topics Concern  . Not on file  Social History Narrative   Patient lives at [facility], With his wife.    Allergies  Allergen Reactions  . Flexeril [Cyclobenzaprine] Diarrhea    Pertinent  Health Maintenance Due  Topic Date Due  . INFLUENZA VACCINE  01/26/2018  . PNA vac Low Risk Adult  Completed    Medications: Outpatient Encounter Medications as of 10/28/2017  Medication Sig  . bisacodyl (DULCOLAX) 10 MG suppository Place 1 suppository (10 mg total) rectally daily as needed for moderate constipation.  . bisoprolol (ZEBETA) 5 MG tablet Take 1 tablet (  5 mg total) by mouth daily.  Marland Kitchen docusate sodium (COLACE) 100 MG capsule Take 1 capsule (100 mg total) by mouth 2 (two) times daily.  . furosemide (LASIX) 40 MG tablet Take 1 tablet (40 mg total) by mouth daily and take an additional 0.5 tablet (20 mg total) by mouth 3 days a week.  . hydrALAZINE (APRESOLINE) 25 MG tablet Take 1 tablet (25 mg total) by mouth daily at lunchtime. Take an additional tablet as needed for systolic blood pressure >093 mmHg  . isosorbide mononitrate (IMDUR) 30 MG 24 hr tablet TAKE 1 TABLET DAILY  . LORazepam (ATIVAN) 1 MG tablet Take 1 tablet (1 mg total) by mouth every 8 (eight) hours as needed for anxiety.  . methocarbamol (ROBAXIN) 500 MG tablet Take 500 mg by mouth as directed. Take one tablet daily; then take 1 tablet every 8 hours as needed.  . Multiple Vitamins-Minerals (MULTIVITAMIN PO) Take 1 tablet by mouth daily.  . ondansetron (ZOFRAN) 4 MG tablet Take 1 tablet (4 mg total) by mouth every 6 (six) hours as needed for nausea.  . polyethylene glycol (MIRALAX / GLYCOLAX) packet Take 17 g by mouth daily as needed for mild constipation.  . traMADol (ULTRAM) 50 MG tablet Take 50 mg by mouth every 6 (six) hours as needed for moderate pain.  Marland Kitchen warfarin (COUMADIN) 5 MG tablet Take 5  mg by mouth as directed.   . [DISCONTINUED] methocarbamol (ROBAXIN) 500 MG tablet Take 1 tablet (500 mg total) by mouth every 6 (six) hours as needed for muscle spasms. (Patient not taking: Reported on 10/28/2017)  . [DISCONTINUED] oxyCODONE (OXY IR/ROXICODONE) 5 MG immediate release tablet Take 1-2 tablets (5-10 mg total) by mouth every 4 (four) hours as needed for moderate pain or severe pain.   No facility-administered encounter medications on file as of 10/28/2017.      Review of Systems  Constitutional: Negative for appetite change, chills, fatigue and fever.  HENT: Negative for congestion, rhinorrhea, sinus pressure, sinus pain, sneezing and sore throat.   Eyes: Positive for visual disturbance. Negative for pain, discharge, redness and itching.       Wears eye glasses   Respiratory: Negative for cough, chest tightness, shortness of breath and wheezing.   Cardiovascular: Negative for chest pain, palpitations and leg swelling.  Gastrointestinal: Negative for abdominal distention, abdominal pain, constipation, diarrhea, nausea and vomiting.  Endocrine: Negative for cold intolerance, heat intolerance, polydipsia, polyphagia and polyuria.  Genitourinary: Negative for dysuria, flank pain, frequency and urgency.  Musculoskeletal: Positive for gait problem.       Right hip pain under control with current regimen  Skin: Negative for color change, pallor, rash and wound.  Neurological: Negative for dizziness, weakness, light-headedness and headaches.  Hematological: Does not bruise/bleed easily.  Psychiatric/Behavioral: Negative for agitation, confusion and sleep disturbance. The patient is not nervous/anxious.     Vitals:   10/28/17 1007  BP: (!) 154/79  Pulse: 79  Resp: 20  Temp: (!) 97.4 F (36.3 C)  SpO2: 96%  Weight: 150 lb (68 kg)  Height: 6' (1.829 m)   Body mass index is 20.34 kg/m. Physical Exam  Constitutional: He is oriented to person, place, and time.  Thin built tall  elderly in no acute distress  HENT:  Head: Normocephalic.  Right Ear: External ear normal.  Left Ear: External ear normal.  Mouth/Throat: Oropharynx is clear and moist. No oropharyngeal exudate.  Eyes: Pupils are equal, round, and reactive to light. Conjunctivae and EOM are normal.  Right eye exhibits no discharge. Left eye exhibits no discharge. No scleral icterus.  Eye glasses in place   Neck: Normal range of motion. No JVD present. No thyromegaly present.  Cardiovascular: Intact distal pulses. Exam reveals no gallop and no friction rub.  Murmur heard. Pulmonary/Chest: Effort normal and breath sounds normal. No respiratory distress. He has no wheezes. He has no rales.  Abdominal: Soft. Bowel sounds are normal. He exhibits no distension. There is no tenderness. There is no rebound and no guarding.  Genitourinary:  Genitourinary Comments: Continent   Musculoskeletal: He exhibits no edema or tenderness.  Unsteady gait uses front wheel walker.Moves x 4 extremities limited ROM to R hip status post hip replacement.  Lymphadenopathy:    He has no cervical adenopathy.  Neurological: He is oriented to person, place, and time. Gait abnormal.  Skin: Skin is warm and dry. No rash noted. No erythema. No pallor.  Right hip incision completely healed.No redness,swelling or drainage noted.No tenderness to palpation.  Psychiatric: Mood, affect and judgment normal.  Nursing note and vitals reviewed.   Labs reviewed: Basic Metabolic Panel: Recent Labs    10/09/17 2030 10/10/17 0421 10/11/17 0503 10/12/17 0456 10/14/17 0534 10/20/17  NA  --  132* 133* 135 131* 130  K  --  4.5 4.3 4.3 4.7 4.2  CL  --  99* 102 102 103  --   CO2  --  26 24 25  21*  --   GLUCOSE  --  123* 89 87 122*  --   BUN  --  23* 21* 17 22* 25*  CREATININE  --  1.16 1.09 1.01 0.98 1.38  CALCIUM  --  8.6* 8.6* 9.0 8.1* 8.6  MG 2.3 2.1  --   --   --   --    Liver Function Tests: Recent Labs    10/09/17 1501 10/10/17 0421    AST 39 36  ALT 21 23  ALKPHOS 59 49  BILITOT 1.3* 1.4*  PROT 7.1 6.2*  ALBUMIN 4.2 3.5   CBC: Recent Labs    10/09/17 1501  10/12/17 0456 10/13/17 0832 10/14/17 0534 10/20/17  WBC 3.7*   < > 3.7* 3.3* 4.3 3.5  NEUTROABS 3.0  --   --   --   --   --   HGB 12.5*   < > 12.6* 10.1* 9.7* 9.2  HCT 36.6*   < > 36.8* 29.3* 28.5* 26.3  MCV 98.4   < > 98.7 99.0 96.9  --   PLT 134*   < > 107* 105* 114*  --    < > = values in this interval not displayed.    Procedures and Imaging Studies During Stay: Dg Chest 1 View  Result Date: 10/09/2017 CLINICAL DATA:  Pain following fall EXAM: CHEST  1 VIEW COMPARISON:  April 22, 2010 FINDINGS: There is no appreciable edema or consolidation. Heart is upper normal in size with pulmonary vascularity within normal limits. Patient is status post mitral valve replacement. There is aortic atherosclerosis. No adenopathy. No bone lesions. No pneumothorax. IMPRESSION: No edema or consolidation. No pneumothorax. Heart upper normal in size. Status post mitral valve replacement. Aortic atherosclerosis present. Aortic Atherosclerosis (ICD10-I70.0). Electronically Signed   By: Lowella Grip III M.D.   On: 10/09/2017 15:12   Dg Pelvis Portable  Result Date: 10/12/2017 CLINICAL DATA:  Right hip replacement today EXAM: PORTABLE PELVIS 1-2 VIEWS COMPARISON:  10/09/2017 FINDINGS: Right hip replacement in satisfactory position alignment. No immediate complication Chronic left  hip replacement without radiographic abnormality IMPRESSION: Satisfactory right hip replacement for fracture Electronically Signed   By: Franchot Gallo M.D.   On: 10/12/2017 17:20   Dg C-arm 1-60 Min-no Report  Result Date: 10/12/2017 Fluoroscopy was utilized by the requesting physician.  No radiographic interpretation.   Dg Hip Unilat With Pelvis 2-3 Views Right  Result Date: 10/09/2017 CLINICAL DATA:  Pain following fall EXAM: DG HIP (WITH OR WITHOUT PELVIS) 2-3V RIGHT COMPARISON:  None.  FINDINGS: Frontal pelvis as well as frontal and lateral right hip images were obtained. There is a subcapital femoral neck fracture on the right with varus angulation at the fracture site. There is a total hip replacement left with prosthetic components well-seated. No other fracture. No dislocation. There is moderate narrowing of the right hip joint. IMPRESSION: Subcapital femoral neck fracture on the right with varus angulation at the fracture site. Total hip replacement on the left with prosthetic components appearing well-seated. No dislocation. Moderate narrowing right hip joint. Electronically Signed   By: Lowella Grip III M.D.   On: 10/09/2017 15:11   Assessment/Plan:    1. Unsteady gait Has worked well with PT/ OT. Will discharge back to independent Living with PT/OT to continue with ROM, Exercise, Gait stability and muscle strengthening. No DME required has own FWW.continue with Fall and safety precautions.  2. Closed fracture of right hip with routine healing, subsequent encounter Status post hospital admission from 4/07/01/2017-10/14/2017 post fall with right femoral neck fracture.He is status post right total hip arthroplasty.seen by orthopedic 10/25/2017.continue with PT/OT.Right hip incision healed.pain under control with tylenol and tramadol.will discharge with medication from the facility no hard script written.continue to follow up with orthopedic.CBC in 1-2 weeks with PCP.    3. Muscle spasm Continue on methocarbamol 500 mg tablet every 8 hours as needed.  4. Venous insufficiency No edema or ulceration.chronic brown skin changes on legs.would like to continue with ted hose on in the morning and off at bedtime.  5. Paroxysmal atrial fibrillation HR controlled.continue on bisoprolol 5 mg tablet daily.   6. S/P MVR (mitral valve replacement) On warfarin. INR today was 1.4 (goal 2.5-3.5 )Warfarin increased to 4 mg tablet daily.Recheck INR 10/31/2017.INR to be checked by cardiology  coumadin clinic.Facility Nurse supervisor to notify cardiology office.Patient states wife will also call cardiology office.  7. Slow transit constipation Current regimen effective.continue on miralax 17 gmdaily as needed,colace 100 mg capsule twice daily.continue to encourage oral intake and hydration.  8.HTN B/p reviewed stable.continue on hydralazine 25 mg tablet,bisoprolol 5 mg tablet daily and Imdur 30 mg tablet.CBC,CMP in 1-2 weeks with PCP.     Patient is being discharged with the following health services:   -PT/OT for ROM, exercise, gait stability and muscle strengthening  Patient is being discharged with the following durable medical equipment:   None needed has own FWW.  Patient has been advised to f/u with their PCP in 1-2 weeks to for a transitions of care visit.Social services at their facility was responsible for arranging this appointment.  Pt will discharge with adequate prescriptions medication of noncontrolled medications to reach the scheduled appointment.For controlled substances, a limited supply was provided as appropriate for the individual patient. If the pt normally receives these medications from a pain clinic or has a contract with another physician, these medications should be received from that clinic or physician only).    Future labs/tests needed:  CBC, BMP in 1-2 weeks PCP.INR 10/31/2017 with cardiology coumadin clinic.

## 2017-10-28 NOTE — Telephone Encounter (Signed)
New Message:      Pt is leaving and he had an INR and it was 1.4 this morning and pt needs instructions on what to do. Pt would like to speak with Cyril Mourning in coumadin clinic

## 2017-10-31 NOTE — Telephone Encounter (Signed)
Spoke with wife on Friday, gave dosing instructions and got contact information for [facility].  Fax sent Monday with orders for INR draw Thursday.

## 2017-11-02 DIAGNOSIS — S72009D Fracture of unspecified part of neck of unspecified femur, subsequent encounter for closed fracture with routine healing: Secondary | ICD-10-CM | POA: Diagnosis not present

## 2017-11-02 DIAGNOSIS — R29898 Other symptoms and signs involving the musculoskeletal system: Secondary | ICD-10-CM | POA: Diagnosis not present

## 2017-11-02 DIAGNOSIS — Z96641 Presence of right artificial hip joint: Secondary | ICD-10-CM | POA: Diagnosis not present

## 2017-11-02 DIAGNOSIS — R2681 Unsteadiness on feet: Secondary | ICD-10-CM | POA: Diagnosis not present

## 2017-11-03 ENCOUNTER — Ambulatory Visit (INDEPENDENT_AMBULATORY_CARE_PROVIDER_SITE_OTHER): Payer: Medicare Other | Admitting: Pharmacist

## 2017-11-03 DIAGNOSIS — Z96641 Presence of right artificial hip joint: Secondary | ICD-10-CM | POA: Diagnosis not present

## 2017-11-03 DIAGNOSIS — Z952 Presence of prosthetic heart valve: Secondary | ICD-10-CM | POA: Diagnosis not present

## 2017-11-03 DIAGNOSIS — I059 Rheumatic mitral valve disease, unspecified: Secondary | ICD-10-CM

## 2017-11-03 DIAGNOSIS — R2681 Unsteadiness on feet: Secondary | ICD-10-CM | POA: Diagnosis not present

## 2017-11-03 DIAGNOSIS — R29898 Other symptoms and signs involving the musculoskeletal system: Secondary | ICD-10-CM | POA: Diagnosis not present

## 2017-11-03 DIAGNOSIS — I480 Paroxysmal atrial fibrillation: Secondary | ICD-10-CM | POA: Diagnosis not present

## 2017-11-03 DIAGNOSIS — Z7901 Long term (current) use of anticoagulants: Secondary | ICD-10-CM

## 2017-11-03 DIAGNOSIS — S72009D Fracture of unspecified part of neck of unspecified femur, subsequent encounter for closed fracture with routine healing: Secondary | ICD-10-CM | POA: Diagnosis not present

## 2017-11-03 LAB — PROTIME-INR: INR: 2 — AB (ref 0.9–1.1)

## 2017-11-07 DIAGNOSIS — S72009D Fracture of unspecified part of neck of unspecified femur, subsequent encounter for closed fracture with routine healing: Secondary | ICD-10-CM | POA: Diagnosis not present

## 2017-11-07 DIAGNOSIS — R2681 Unsteadiness on feet: Secondary | ICD-10-CM | POA: Diagnosis not present

## 2017-11-07 DIAGNOSIS — S72001D Fracture of unspecified part of neck of right femur, subsequent encounter for closed fracture with routine healing: Secondary | ICD-10-CM | POA: Diagnosis not present

## 2017-11-07 DIAGNOSIS — Z96641 Presence of right artificial hip joint: Secondary | ICD-10-CM | POA: Diagnosis not present

## 2017-11-07 DIAGNOSIS — R29898 Other symptoms and signs involving the musculoskeletal system: Secondary | ICD-10-CM | POA: Diagnosis not present

## 2017-11-08 DIAGNOSIS — R2681 Unsteadiness on feet: Secondary | ICD-10-CM | POA: Diagnosis not present

## 2017-11-08 DIAGNOSIS — Z96641 Presence of right artificial hip joint: Secondary | ICD-10-CM | POA: Diagnosis not present

## 2017-11-08 DIAGNOSIS — R29898 Other symptoms and signs involving the musculoskeletal system: Secondary | ICD-10-CM | POA: Diagnosis not present

## 2017-11-08 DIAGNOSIS — S72009D Fracture of unspecified part of neck of unspecified femur, subsequent encounter for closed fracture with routine healing: Secondary | ICD-10-CM | POA: Diagnosis not present

## 2017-11-10 DIAGNOSIS — R2681 Unsteadiness on feet: Secondary | ICD-10-CM | POA: Diagnosis not present

## 2017-11-10 DIAGNOSIS — R29898 Other symptoms and signs involving the musculoskeletal system: Secondary | ICD-10-CM | POA: Diagnosis not present

## 2017-11-10 DIAGNOSIS — Z96641 Presence of right artificial hip joint: Secondary | ICD-10-CM | POA: Diagnosis not present

## 2017-11-10 DIAGNOSIS — S72009D Fracture of unspecified part of neck of unspecified femur, subsequent encounter for closed fracture with routine healing: Secondary | ICD-10-CM | POA: Diagnosis not present

## 2017-11-14 DIAGNOSIS — R2681 Unsteadiness on feet: Secondary | ICD-10-CM | POA: Diagnosis not present

## 2017-11-14 DIAGNOSIS — R29898 Other symptoms and signs involving the musculoskeletal system: Secondary | ICD-10-CM | POA: Diagnosis not present

## 2017-11-14 DIAGNOSIS — S72009D Fracture of unspecified part of neck of unspecified femur, subsequent encounter for closed fracture with routine healing: Secondary | ICD-10-CM | POA: Diagnosis not present

## 2017-11-14 DIAGNOSIS — Z96641 Presence of right artificial hip joint: Secondary | ICD-10-CM | POA: Diagnosis not present

## 2017-11-15 ENCOUNTER — Ambulatory Visit (INDEPENDENT_AMBULATORY_CARE_PROVIDER_SITE_OTHER): Payer: Medicare Other | Admitting: Pharmacist Clinician (PhC)/ Clinical Pharmacy Specialist

## 2017-11-15 DIAGNOSIS — Z96641 Presence of right artificial hip joint: Secondary | ICD-10-CM | POA: Diagnosis not present

## 2017-11-15 DIAGNOSIS — Z952 Presence of prosthetic heart valve: Secondary | ICD-10-CM

## 2017-11-15 DIAGNOSIS — I480 Paroxysmal atrial fibrillation: Secondary | ICD-10-CM

## 2017-11-15 DIAGNOSIS — Z7901 Long term (current) use of anticoagulants: Secondary | ICD-10-CM

## 2017-11-15 DIAGNOSIS — I059 Rheumatic mitral valve disease, unspecified: Secondary | ICD-10-CM | POA: Diagnosis not present

## 2017-11-15 DIAGNOSIS — S72009D Fracture of unspecified part of neck of unspecified femur, subsequent encounter for closed fracture with routine healing: Secondary | ICD-10-CM | POA: Diagnosis not present

## 2017-11-15 DIAGNOSIS — R29898 Other symptoms and signs involving the musculoskeletal system: Secondary | ICD-10-CM | POA: Diagnosis not present

## 2017-11-15 DIAGNOSIS — R2681 Unsteadiness on feet: Secondary | ICD-10-CM | POA: Diagnosis not present

## 2017-11-15 DIAGNOSIS — Z471 Aftercare following joint replacement surgery: Secondary | ICD-10-CM | POA: Diagnosis not present

## 2017-11-15 LAB — POCT INR: INR: 4.2 — AB (ref 2.0–3.0)

## 2017-11-15 NOTE — Patient Instructions (Signed)
Description   No warfarin today Tuesday May 21, then decrease dose 1 tablet every day except 1.5 tablets on Sundays and Wednesdays Recheck in 2 week.

## 2017-11-17 DIAGNOSIS — R29898 Other symptoms and signs involving the musculoskeletal system: Secondary | ICD-10-CM | POA: Diagnosis not present

## 2017-11-17 DIAGNOSIS — S72009D Fracture of unspecified part of neck of unspecified femur, subsequent encounter for closed fracture with routine healing: Secondary | ICD-10-CM | POA: Diagnosis not present

## 2017-11-17 DIAGNOSIS — R2681 Unsteadiness on feet: Secondary | ICD-10-CM | POA: Diagnosis not present

## 2017-11-17 DIAGNOSIS — Z96641 Presence of right artificial hip joint: Secondary | ICD-10-CM | POA: Diagnosis not present

## 2017-11-21 DIAGNOSIS — Z96641 Presence of right artificial hip joint: Secondary | ICD-10-CM | POA: Diagnosis not present

## 2017-11-21 DIAGNOSIS — R2681 Unsteadiness on feet: Secondary | ICD-10-CM | POA: Diagnosis not present

## 2017-11-21 DIAGNOSIS — S72009D Fracture of unspecified part of neck of unspecified femur, subsequent encounter for closed fracture with routine healing: Secondary | ICD-10-CM | POA: Diagnosis not present

## 2017-11-21 DIAGNOSIS — R29898 Other symptoms and signs involving the musculoskeletal system: Secondary | ICD-10-CM | POA: Diagnosis not present

## 2017-11-22 DIAGNOSIS — S72009D Fracture of unspecified part of neck of unspecified femur, subsequent encounter for closed fracture with routine healing: Secondary | ICD-10-CM | POA: Diagnosis not present

## 2017-11-22 DIAGNOSIS — R29898 Other symptoms and signs involving the musculoskeletal system: Secondary | ICD-10-CM | POA: Diagnosis not present

## 2017-11-22 DIAGNOSIS — Z810 Family history of intellectual disabilities: Secondary | ICD-10-CM | POA: Diagnosis not present

## 2017-11-22 DIAGNOSIS — R2681 Unsteadiness on feet: Secondary | ICD-10-CM | POA: Diagnosis not present

## 2017-11-22 DIAGNOSIS — S72001D Fracture of unspecified part of neck of right femur, subsequent encounter for closed fracture with routine healing: Secondary | ICD-10-CM | POA: Diagnosis not present

## 2017-11-22 DIAGNOSIS — Z96641 Presence of right artificial hip joint: Secondary | ICD-10-CM | POA: Diagnosis not present

## 2017-11-24 DIAGNOSIS — R2681 Unsteadiness on feet: Secondary | ICD-10-CM | POA: Diagnosis not present

## 2017-11-24 DIAGNOSIS — R29898 Other symptoms and signs involving the musculoskeletal system: Secondary | ICD-10-CM | POA: Diagnosis not present

## 2017-11-24 DIAGNOSIS — Z96641 Presence of right artificial hip joint: Secondary | ICD-10-CM | POA: Diagnosis not present

## 2017-11-24 DIAGNOSIS — S72009D Fracture of unspecified part of neck of unspecified femur, subsequent encounter for closed fracture with routine healing: Secondary | ICD-10-CM | POA: Diagnosis not present

## 2017-11-27 DIAGNOSIS — Z96641 Presence of right artificial hip joint: Secondary | ICD-10-CM | POA: Diagnosis not present

## 2017-11-27 DIAGNOSIS — S72009D Fracture of unspecified part of neck of unspecified femur, subsequent encounter for closed fracture with routine healing: Secondary | ICD-10-CM | POA: Diagnosis not present

## 2017-11-27 DIAGNOSIS — R2681 Unsteadiness on feet: Secondary | ICD-10-CM | POA: Diagnosis not present

## 2017-11-29 ENCOUNTER — Ambulatory Visit (INDEPENDENT_AMBULATORY_CARE_PROVIDER_SITE_OTHER): Payer: Medicare Other | Admitting: Pharmacist

## 2017-11-29 DIAGNOSIS — Z952 Presence of prosthetic heart valve: Secondary | ICD-10-CM

## 2017-11-29 DIAGNOSIS — R2681 Unsteadiness on feet: Secondary | ICD-10-CM | POA: Diagnosis not present

## 2017-11-29 DIAGNOSIS — I059 Rheumatic mitral valve disease, unspecified: Secondary | ICD-10-CM

## 2017-11-29 DIAGNOSIS — S72009D Fracture of unspecified part of neck of unspecified femur, subsequent encounter for closed fracture with routine healing: Secondary | ICD-10-CM | POA: Diagnosis not present

## 2017-11-29 DIAGNOSIS — Z96641 Presence of right artificial hip joint: Secondary | ICD-10-CM | POA: Diagnosis not present

## 2017-11-29 DIAGNOSIS — Z7901 Long term (current) use of anticoagulants: Secondary | ICD-10-CM | POA: Diagnosis not present

## 2017-11-29 DIAGNOSIS — I480 Paroxysmal atrial fibrillation: Secondary | ICD-10-CM | POA: Diagnosis not present

## 2017-11-29 LAB — POCT INR: INR: 3.7 — AB (ref 2.0–3.0)

## 2017-11-30 DIAGNOSIS — R2681 Unsteadiness on feet: Secondary | ICD-10-CM | POA: Diagnosis not present

## 2017-11-30 DIAGNOSIS — Z96641 Presence of right artificial hip joint: Secondary | ICD-10-CM | POA: Diagnosis not present

## 2017-11-30 DIAGNOSIS — S72009D Fracture of unspecified part of neck of unspecified femur, subsequent encounter for closed fracture with routine healing: Secondary | ICD-10-CM | POA: Diagnosis not present

## 2017-12-05 DIAGNOSIS — Z96641 Presence of right artificial hip joint: Secondary | ICD-10-CM | POA: Diagnosis not present

## 2017-12-05 DIAGNOSIS — S72009D Fracture of unspecified part of neck of unspecified femur, subsequent encounter for closed fracture with routine healing: Secondary | ICD-10-CM | POA: Diagnosis not present

## 2017-12-05 DIAGNOSIS — R2681 Unsteadiness on feet: Secondary | ICD-10-CM | POA: Diagnosis not present

## 2017-12-06 DIAGNOSIS — Z96641 Presence of right artificial hip joint: Secondary | ICD-10-CM | POA: Diagnosis not present

## 2017-12-06 DIAGNOSIS — R2681 Unsteadiness on feet: Secondary | ICD-10-CM | POA: Diagnosis not present

## 2017-12-06 DIAGNOSIS — S72009D Fracture of unspecified part of neck of unspecified femur, subsequent encounter for closed fracture with routine healing: Secondary | ICD-10-CM | POA: Diagnosis not present

## 2017-12-08 DIAGNOSIS — Z96641 Presence of right artificial hip joint: Secondary | ICD-10-CM | POA: Diagnosis not present

## 2017-12-08 DIAGNOSIS — R2681 Unsteadiness on feet: Secondary | ICD-10-CM | POA: Diagnosis not present

## 2017-12-08 DIAGNOSIS — S72009D Fracture of unspecified part of neck of unspecified femur, subsequent encounter for closed fracture with routine healing: Secondary | ICD-10-CM | POA: Diagnosis not present

## 2017-12-12 DIAGNOSIS — Z96641 Presence of right artificial hip joint: Secondary | ICD-10-CM | POA: Diagnosis not present

## 2017-12-12 DIAGNOSIS — R2681 Unsteadiness on feet: Secondary | ICD-10-CM | POA: Diagnosis not present

## 2017-12-12 DIAGNOSIS — S72009D Fracture of unspecified part of neck of unspecified femur, subsequent encounter for closed fracture with routine healing: Secondary | ICD-10-CM | POA: Diagnosis not present

## 2017-12-13 ENCOUNTER — Ambulatory Visit (INDEPENDENT_AMBULATORY_CARE_PROVIDER_SITE_OTHER): Payer: Medicare Other | Admitting: Pharmacist Clinician (PhC)/ Clinical Pharmacy Specialist

## 2017-12-13 DIAGNOSIS — Z7901 Long term (current) use of anticoagulants: Secondary | ICD-10-CM | POA: Diagnosis not present

## 2017-12-13 DIAGNOSIS — I059 Rheumatic mitral valve disease, unspecified: Secondary | ICD-10-CM | POA: Diagnosis not present

## 2017-12-13 DIAGNOSIS — I480 Paroxysmal atrial fibrillation: Secondary | ICD-10-CM

## 2017-12-13 DIAGNOSIS — Z952 Presence of prosthetic heart valve: Secondary | ICD-10-CM

## 2017-12-13 LAB — POCT INR: INR: 2.7 (ref 2.0–3.0)

## 2017-12-20 DIAGNOSIS — Z85828 Personal history of other malignant neoplasm of skin: Secondary | ICD-10-CM | POA: Diagnosis not present

## 2017-12-20 DIAGNOSIS — D485 Neoplasm of uncertain behavior of skin: Secondary | ICD-10-CM | POA: Diagnosis not present

## 2017-12-20 DIAGNOSIS — D1801 Hemangioma of skin and subcutaneous tissue: Secondary | ICD-10-CM | POA: Diagnosis not present

## 2017-12-20 DIAGNOSIS — L570 Actinic keratosis: Secondary | ICD-10-CM | POA: Diagnosis not present

## 2017-12-20 DIAGNOSIS — L821 Other seborrheic keratosis: Secondary | ICD-10-CM | POA: Diagnosis not present

## 2018-01-18 ENCOUNTER — Ambulatory Visit (INDEPENDENT_AMBULATORY_CARE_PROVIDER_SITE_OTHER): Payer: Medicare Other | Admitting: Pharmacist

## 2018-01-18 ENCOUNTER — Encounter: Payer: Self-pay | Admitting: Cardiology

## 2018-01-18 ENCOUNTER — Ambulatory Visit (INDEPENDENT_AMBULATORY_CARE_PROVIDER_SITE_OTHER): Payer: Medicare Other | Admitting: Cardiology

## 2018-01-18 DIAGNOSIS — I061 Rheumatic aortic insufficiency: Secondary | ICD-10-CM | POA: Diagnosis not present

## 2018-01-18 DIAGNOSIS — I10 Essential (primary) hypertension: Secondary | ICD-10-CM | POA: Diagnosis not present

## 2018-01-18 DIAGNOSIS — I480 Paroxysmal atrial fibrillation: Secondary | ICD-10-CM

## 2018-01-18 DIAGNOSIS — I059 Rheumatic mitral valve disease, unspecified: Secondary | ICD-10-CM | POA: Diagnosis not present

## 2018-01-18 DIAGNOSIS — Z952 Presence of prosthetic heart valve: Secondary | ICD-10-CM

## 2018-01-18 DIAGNOSIS — Z7901 Long term (current) use of anticoagulants: Secondary | ICD-10-CM

## 2018-01-18 DIAGNOSIS — I428 Other cardiomyopathies: Secondary | ICD-10-CM | POA: Diagnosis not present

## 2018-01-18 LAB — POCT INR: INR: 2.8 (ref 2.0–3.0)

## 2018-01-18 NOTE — Patient Instructions (Signed)
Medication Instructions:   NO CHANGE  Testing/Procedures:  Your physician has requested that you have an echocardiogram. Echocardiography is a painless test that uses sound waves to create images of your heart. It provides your doctor with information about the size and shape of your heart and how well your heart's chambers and valves are working. This procedure takes approximately one hour. There are no restrictions for this procedure.SCHEDULE IN ONE YEAR PRIOR TO FOLLOW UP   Follow-Up:  Your physician wants you to follow-up in: [place] will receive a reminder letter in the mail two months in advance. If you don't receive a letter, please call our office to schedule the follow-up appointment.   Blain Pais

## 2018-01-18 NOTE — Progress Notes (Signed)
PCP: [name], MD  Clinic Note: Chief Complaint  Patient presents with  . Follow-up    See Below - No complaints    HPI: Richard Spears is a 84 y.o. male with a PMH below who presents today for 3-week follow-up for atrial fibrillation history of mitral valve replacement with some shortness of breath He previously saw Dr. Ilda Spears, then Dr. Lendell Spears. (changed to Dr. Ellyn Spears b/c his wife is also a patient) He has history of:  A. fib. Rate controlled on Zebeta, warfarin for anticoagulation. CHADS2VASC=2 (age x 2) Status post mitral valve replacement mechanical. 1997 - on Warfarin Venous deficiency, wears support stockings.  Mild orthostatic dizziness.  Moderate AI stable from 2012/ - 2017  Richard Spears was seen on May 12, 2017.  Follow-up evaluation of exertional dyspnea/heart failure.  We intended to change his hydralazine dosing to 25 twice daily and as needed -however he did not start taking it.  He was concerned about hypotension.  Recent Hospitalizations:   Slip & fall April - Hip Fracture -- s/p hip replacement.  Studies Personally Reviewed - (if available, images/films reviewed: From Epic Chart or Care Everywhere)  No new studies.  Interval History: [name] returns today doing well without any complaints.  He is doing fine from a cardiac standpoint, but had a slip and fall walking in the hallway and he broke his hip.  He did well with hip surgery, in fact actually replaced his hip from an anterior approach.  This is recovered from surgery quite well.  He is just a little bit fatigued and having a hard time getting himself up and going ever since his surgery.  He tells me that he still has episodes where his blood pressures go up into the 160-170 mmHg range, but they are short-lived.  However he still has some mild positional dizziness which have been limiting her ability to control his blood pressure more than this. He has not used PRN Hydralazine    Probably now but this is really deconditioned he is alert more short of breath with doing things, but no chest tightness or pressure with rest or exertion.  No heart failure symptoms of PND, orthopnea or any notable edema. No sense of rapid irregular heartbeats to suspect recurrent A. fib.  Just a few palpitations here and there.  No syncope/near syncope or TIA/amaurosis fugax. No bleeding issues on warfarin.   ROS: A comprehensive was performed. Review of Systems  Constitutional: Positive for malaise/fatigue (Slowly improving postop, but not back to baseline).  HENT: Negative for nosebleeds.   Respiratory: Positive for shortness of breath (mostly exertional now.). Negative for cough and hemoptysis.   Cardiovascular: Negative for chest pain. Leg swelling: Controlled with Lasix.  Gastrointestinal: Negative for blood in stool.  Genitourinary: Negative for hematuria and urgency.  Musculoskeletal: Positive for back pain and joint pain. Negative for myalgias.       Recovering relatively well post-op HIP Hemiarthroplasty.   Neurological: Positive for dizziness (Positional).  Psychiatric/Behavioral: Positive for depression. Negative for memory loss. The patient is nervous/anxious. The patient does not have insomnia.   All other systems reviewed and are negative.   I have reviewed and (if needed) personally updated the patient's problem list, medications, allergies, past medical and surgical history, social and family history.   Past Medical History:  Diagnosis Date  . Anemia    leakoppenia  . BPH (benign prostatic hypertrophy)   . Bullous pemphigoid    Richard Spears, March 2011,  right forearm squamous cell carcinoma  . Chronic anticoagulation    systemic  . Colon polyp    transverse, 2002  . History of peptic ulcer    remote, 3/95  . Hx of actinic keratosis   . Hx of basal cell carcinoma   . Hx of squamous cell carcinoma of skin   . Hyperlipidemia   . Left inguinal hernia   . Moderate  aortic insufficiency 2009   audible aortic insufficiency on 1/09 echo  . PAF (paroxysmal atrial fibrillation) (Richard Spears) 01/17/2014   On Warfarin.  . S/P mitral valve replacement with metallic valve 56/3149   INR goal 2.5-3.5, St Jude,   . 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 Electrodesiccation and Curettage and Shave Biopsy Right    Right medial, anterio tibia: Well differentiated Squamous Cell  . hip replacements Left    10 years ago  . MITRAL VALVE REPLACEMENT  03/1996   St. Jude mechanical valve  . TOTAL HIP ARTHROPLASTY Right 10/12/2017   Procedure: RIGHT TOTAL HIP ARTHROPLASTY ANTERIOR APPROACH;  Surgeon: Richard Arabian, MD;  Location: [facility];  Service: Orthopedics;  Laterality: Right;  . TRANSTHORACIC ECHOCARDIOGRAM  01/19/2011   EF 60-65%. No regional wall motion abnormality. Mechanical mitral valve in place. Moderate aortic regurgitation.  . TRANSTHORACIC ECHOCARDIOGRAM  08/'17; 10/'18    a) Mild conc LVH. EF 55-60%. No RWMA. Mod AI. Mechanical MV prosthesis functioning properly. LAD dilation.;; b)  EF 55-60%.  Mo AI.  Bileaflet Saint Jude mechanical MV with no paravalvular leak.  Severe LA dilation.  Minimally elevated PAP    Current Meds  Medication Sig  . bisoprolol (ZEBETA) 5 MG tablet Take 1 tablet (5 mg total) by mouth daily.  . furosemide (LASIX) 40 MG tablet Take 1 tablet (40 mg total) by mouth daily and take an additional 0.5 tablet (20 mg total) by mouth 3 days a week.  . hydrALAZINE (APRESOLINE) 25 MG tablet Take 1 tablet (25 mg total) by mouth daily at lunchtime. Take an additional tablet as needed for systolic blood pressure >702 mmHg  . isosorbide mononitrate (IMDUR) 30 MG 24 hr tablet TAKE 1 TABLET DAILY  . LORazepam (ATIVAN) 1 MG tablet Take 1 tablet (1 mg total) by mouth every 8 (eight) hours as needed for anxiety.  . Multiple Vitamins-Minerals (MULTIVITAMIN PO) Take 1  tablet by mouth daily.  . Multiple Vitamins-Minerals (PRESERVISION AREDS 2 PO) Take 1 capsule by mouth daily.  . traMADol (ULTRAM) 50 MG tablet Take 50 mg by mouth every 6 (six) hours as needed for moderate pain.  Marland Kitchen warfarin (COUMADIN) 5 MG tablet Take 5 mg by mouth as directed.     Allergies  Allergen Reactions  . Flexeril [Cyclobenzaprine] Diarrhea    Social History   Tobacco Use  . Smoking status: Former Smoker    Packs/day: 1.00    Years: 8.00    Pack years: 8.00    Types: Cigarettes    Last attempt to quit: 10/31/1954    Years since quitting: 63.2  . Smokeless tobacco: Never Used  Substance Use Topics  . Alcohol use: Yes    Alcohol/week: 0.0 oz    Comment: 1-2 drinks per day  . Drug use: Never   Social History   Social History Narrative   Patient lives at [facility], With his wife - Meryl Crutch.    family history includes  COPD in his brother; Cancer in his brother; Hypertension in his mother; Lung cancer in his sister; Lupus in his son; Other in his sister.  Wt Readings from Last 3 Encounters:  01/18/18 156 lb 3.2 oz (70.9 kg)  10/28/17 150 lb (68 kg)  10/14/17 168 lb 10.4 oz (76.5 kg)  - Eating more.     PHYSICAL EXAM BP (!) 118/58   Pulse 72   Ht 6' (1.829 m)   Wt 156 lb 3.2 oz (70.9 kg)   BMI 21.18 kg/m  Physical Exam  Constitutional: He is oriented to person, place, and time. He appears well-developed and well-nourished. No distress.  Appears younger than his stated age  HENT:  Head: Normocephalic and atraumatic.  Mouth/Throat: No oropharyngeal exudate.  Neck: Neck supple. No hepatojugular reflux and no JVD present. Carotid bruit is not present.  Cardiovascular: Normal rate, regular rhythm and intact distal pulses.  Occasional extrasystoles are present. PMI is not displaced. Exam reveals no gallop.  Murmur heard.  Low-pitched rumbling holosystolic murmur is present with a grade of 1/6 at the upper left sternal border and lower left sternal border.   Low-pitched holodiastolic murmur is present with a grade of 1/6 at the upper right sternal border. Crisp metallic S1  Pulmonary/Chest: Effort normal and breath sounds normal. No respiratory distress. He has no wheezes. He has no rales.  Abdominal: Soft. Bowel sounds are normal. He exhibits no distension. There is no tenderness. There is no rebound.  Musculoskeletal: Normal range of motion. He exhibits no edema.  Neurological: He is alert and oriented to person, place, and time.  Skin: Skin is warm and dry.  Psychiatric: He has a normal mood and affect. His behavior is normal. Judgment and thought content normal.  Nursing note and vitals reviewed.   Adult ECG Report N/A  Other studies Reviewed: Additional studies/ records that were reviewed today include:  Recent Labs:   Lab Results  Component Value Date   CREATININE 1.38 10/20/2017   BUN 25 (A) 10/20/2017   NA 130 10/20/2017   K 4.2 10/20/2017   CL 103 10/14/2017   CO2 21 (L) 10/14/2017   No results found for: CHOL, HDL, LDLCALC, LDLDIRECT, TRIG, CHOLHDL  ASSESSMENT / PLAN:  Problem List Items Addressed This Visit    AI (aortic insufficiency) (Chronic)    Consistent moderate AI with Mechanical MV -- No concerning SSx of CHF. plan to f/u Echo in July 2020.      Relevant Orders   ECHOCARDIOGRAM COMPLETE   Essential hypertension (Chronic)    Well controlled today - no changes to current medications. --  He does have some Hypertension spells, but not prolonged & not associated with symptoms --> at this point, would feel more comfortable allowing permissive Hypertension, but he still has PRN Hydralazine for SBP > 160-170 mmHg      Paroxysmal atrial fibrillation (HCC) - CHA2DS2-VASc Score 3 (Chronic)    He has no real sense of whether he is in A. fib or not.  Seems regular on exam today.  No heart failure or exertional dyspnea symptoms associated with it.  Remains on warfarin for his mitral valve as well as A. fib.  No bleeding  issues. Remains on low-dose beta-blocker for rate control.      S/P MVR (mitral valve replacement) (Chronic)    Plan for 2 D Echo next summer to follow. On Warfarin. Need Abx Prophylaxis - discussed.  He is aware.      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  Has not used any additional doses. Currently on beta-blocker but not ACE inhibitor/ARB for afterload reduction.  Using PRN hydralazine. Continue Imdur.         Patient had multiple questions and we had a prolonged discussion.  Close to 30 minutes was spent in direct patient contact.  Greater than 75% of the time was spent in direct patient counseling and explanation.  Current medicines are reviewed at length with the patient today. (+/- concerns) BP is still elevated. The following changes have been made: = add Hydralazine for episodes or Ortho Hypotension.  Patient Instructions  Medication Instructions:   NO CHANGE  Testing/Procedures:  Your physician has requested that you have an echocardiogram. Echocardiography is a painless test that uses sound waves to create images of your heart. It provides your doctor with information about the size and shape of your heart and how well your heart's chambers and valves are working. This procedure takes approximately one hour. There are no restrictions for this procedure.SCHEDULE IN ONE YEAR PRIOR TO FOLLOW UP   Follow-Up:  Your physician wants you to follow-up in: [place] will receive a reminder letter in the mail two months in advance. If you don't receive a letter, please call our office to schedule the follow-up appointment.   Blain Pais      Studies Ordered:   Orders Placed This Encounter  Procedures  . ECHOCARDIOGRAM COMPLETE      Glenetta Hew, M.D., M.S. Interventional Cardiologist   Pager # [phone] Phone # [phone] [address] [place], [place] [address]

## 2018-01-21 ENCOUNTER — Encounter: Payer: Self-pay | Admitting: Cardiology

## 2018-01-21 DIAGNOSIS — I428 Other cardiomyopathies: Secondary | ICD-10-CM | POA: Insufficient documentation

## 2018-01-21 NOTE — Assessment & Plan Note (Addendum)
Well controlled today - no changes to current medications. --  He does have some Hypertension spells, but not prolonged & not associated with symptoms --> at this point, would feel more comfortable allowing permissive Hypertension, but he still has PRN Hydralazine for SBP > 160-170 mmHg

## 2018-01-21 NOTE — Assessment & Plan Note (Signed)
Consistent moderate AI with Mechanical MV -- No concerning SSx of CHF. plan to f/u Echo in July 2020.

## 2018-01-21 NOTE — Assessment & Plan Note (Signed)
Plan for 2 D Echo next summer to follow. On Warfarin. Need Abx Prophylaxis - discussed.  He is aware.

## 2018-01-21 NOTE — Assessment & Plan Note (Signed)
He has no real sense of whether he is in A. fib or not.  Seems regular on exam today.  No heart failure or exertional dyspnea symptoms associated with it.  Remains on warfarin for his mitral valve as well as A. fib.  No bleeding issues. Remains on low-dose beta-blocker for rate control.

## 2018-01-21 NOTE — Assessment & Plan Note (Signed)
Normal LVEF - but diastolic dysfunction --> on standing plus additional doses of Lasix as needed.  Has not used any additional doses. Currently on beta-blocker but not ACE inhibitor/ARB for afterload reduction.  Using PRN hydralazine. Continue Imdur.

## 2018-01-23 DIAGNOSIS — H524 Presbyopia: Secondary | ICD-10-CM | POA: Diagnosis not present

## 2018-01-23 DIAGNOSIS — Z961 Presence of intraocular lens: Secondary | ICD-10-CM | POA: Diagnosis not present

## 2018-01-23 DIAGNOSIS — H04123 Dry eye syndrome of bilateral lacrimal glands: Secondary | ICD-10-CM | POA: Diagnosis not present

## 2018-01-23 DIAGNOSIS — H353131 Nonexudative age-related macular degeneration, bilateral, early dry stage: Secondary | ICD-10-CM | POA: Diagnosis not present

## 2018-01-23 DIAGNOSIS — H5213 Myopia, bilateral: Secondary | ICD-10-CM | POA: Diagnosis not present

## 2018-01-23 DIAGNOSIS — D3141 Benign neoplasm of right ciliary body: Secondary | ICD-10-CM | POA: Diagnosis not present

## 2018-01-23 DIAGNOSIS — H52223 Regular astigmatism, bilateral: Secondary | ICD-10-CM | POA: Diagnosis not present

## 2018-02-19 ENCOUNTER — Other Ambulatory Visit: Payer: Self-pay | Admitting: Cardiology

## 2018-03-01 ENCOUNTER — Ambulatory Visit (INDEPENDENT_AMBULATORY_CARE_PROVIDER_SITE_OTHER): Payer: Medicare Other | Admitting: Pharmacist Clinician (PhC)/ Clinical Pharmacy Specialist

## 2018-03-01 DIAGNOSIS — I059 Rheumatic mitral valve disease, unspecified: Secondary | ICD-10-CM

## 2018-03-01 DIAGNOSIS — Z952 Presence of prosthetic heart valve: Secondary | ICD-10-CM | POA: Diagnosis not present

## 2018-03-01 DIAGNOSIS — I480 Paroxysmal atrial fibrillation: Secondary | ICD-10-CM | POA: Diagnosis not present

## 2018-03-01 LAB — POCT INR: INR: 2.5 (ref 2.0–3.0)

## 2018-04-12 ENCOUNTER — Ambulatory Visit (INDEPENDENT_AMBULATORY_CARE_PROVIDER_SITE_OTHER): Payer: Medicare Other | Admitting: Pharmacist

## 2018-04-12 DIAGNOSIS — I480 Paroxysmal atrial fibrillation: Secondary | ICD-10-CM | POA: Diagnosis not present

## 2018-04-12 DIAGNOSIS — Z952 Presence of prosthetic heart valve: Secondary | ICD-10-CM

## 2018-04-12 DIAGNOSIS — I059 Rheumatic mitral valve disease, unspecified: Secondary | ICD-10-CM | POA: Diagnosis not present

## 2018-04-12 DIAGNOSIS — Z7901 Long term (current) use of anticoagulants: Secondary | ICD-10-CM | POA: Diagnosis not present

## 2018-04-12 LAB — POCT INR: INR: 2.9 (ref 2.0–3.0)

## 2018-05-09 ENCOUNTER — Other Ambulatory Visit: Payer: Self-pay | Admitting: Cardiology

## 2018-05-24 ENCOUNTER — Ambulatory Visit (INDEPENDENT_AMBULATORY_CARE_PROVIDER_SITE_OTHER): Payer: Medicare Other | Admitting: Pharmacist

## 2018-05-24 DIAGNOSIS — I059 Rheumatic mitral valve disease, unspecified: Secondary | ICD-10-CM

## 2018-05-24 DIAGNOSIS — I480 Paroxysmal atrial fibrillation: Secondary | ICD-10-CM

## 2018-05-24 DIAGNOSIS — Z952 Presence of prosthetic heart valve: Secondary | ICD-10-CM

## 2018-05-24 LAB — POCT INR: INR: 2.5 (ref 2.0–3.0)

## 2018-07-04 ENCOUNTER — Ambulatory Visit (INDEPENDENT_AMBULATORY_CARE_PROVIDER_SITE_OTHER): Payer: Medicare Other | Admitting: Pharmacist Clinician (PhC)/ Clinical Pharmacy Specialist

## 2018-07-04 DIAGNOSIS — Z952 Presence of prosthetic heart valve: Secondary | ICD-10-CM

## 2018-07-04 DIAGNOSIS — I480 Paroxysmal atrial fibrillation: Secondary | ICD-10-CM

## 2018-07-04 DIAGNOSIS — I059 Rheumatic mitral valve disease, unspecified: Secondary | ICD-10-CM

## 2018-07-04 LAB — POCT INR: INR: 3.1 — AB (ref 2.0–3.0)

## 2018-07-04 NOTE — Patient Instructions (Signed)
Description   Continue with 1 tablet every day except 1.5 tablets on Mondays and Fridays.  Recheck in 6 week.

## 2018-08-10 DIAGNOSIS — I10 Essential (primary) hypertension: Secondary | ICD-10-CM | POA: Diagnosis not present

## 2018-08-10 DIAGNOSIS — N400 Enlarged prostate without lower urinary tract symptoms: Secondary | ICD-10-CM | POA: Diagnosis not present

## 2018-08-10 DIAGNOSIS — I480 Paroxysmal atrial fibrillation: Secondary | ICD-10-CM | POA: Diagnosis not present

## 2018-08-10 DIAGNOSIS — D649 Anemia, unspecified: Secondary | ICD-10-CM | POA: Diagnosis not present

## 2018-08-10 DIAGNOSIS — I428 Other cardiomyopathies: Secondary | ICD-10-CM | POA: Diagnosis not present

## 2018-08-10 DIAGNOSIS — Z0000 Encounter for general adult medical examination without abnormal findings: Secondary | ICD-10-CM | POA: Diagnosis not present

## 2018-08-10 DIAGNOSIS — Z1389 Encounter for screening for other disorder: Secondary | ICD-10-CM | POA: Diagnosis not present

## 2018-08-10 DIAGNOSIS — Z952 Presence of prosthetic heart valve: Secondary | ICD-10-CM | POA: Diagnosis not present

## 2018-08-10 DIAGNOSIS — N183 Chronic kidney disease, stage 3 (moderate): Secondary | ICD-10-CM | POA: Diagnosis not present

## 2018-08-10 DIAGNOSIS — I872 Venous insufficiency (chronic) (peripheral): Secondary | ICD-10-CM | POA: Diagnosis not present

## 2018-08-16 ENCOUNTER — Ambulatory Visit (INDEPENDENT_AMBULATORY_CARE_PROVIDER_SITE_OTHER): Payer: Medicare Other | Admitting: Pharmacist

## 2018-08-16 DIAGNOSIS — Z952 Presence of prosthetic heart valve: Secondary | ICD-10-CM

## 2018-08-16 DIAGNOSIS — I059 Rheumatic mitral valve disease, unspecified: Secondary | ICD-10-CM

## 2018-08-16 DIAGNOSIS — I480 Paroxysmal atrial fibrillation: Secondary | ICD-10-CM | POA: Diagnosis not present

## 2018-08-16 LAB — POCT INR: INR: 3 (ref 2.0–3.0)

## 2018-09-03 ENCOUNTER — Other Ambulatory Visit: Payer: Self-pay | Admitting: Cardiology

## 2018-09-11 ENCOUNTER — Other Ambulatory Visit: Payer: Self-pay | Admitting: Cardiology

## 2018-10-04 ENCOUNTER — Telehealth: Payer: Self-pay

## 2018-10-04 NOTE — Telephone Encounter (Signed)

## 2018-10-05 ENCOUNTER — Other Ambulatory Visit: Payer: Self-pay

## 2018-10-05 ENCOUNTER — Ambulatory Visit (INDEPENDENT_AMBULATORY_CARE_PROVIDER_SITE_OTHER): Payer: Medicare Other | Admitting: Pharmacist

## 2018-10-05 DIAGNOSIS — Z952 Presence of prosthetic heart valve: Secondary | ICD-10-CM

## 2018-10-05 DIAGNOSIS — I480 Paroxysmal atrial fibrillation: Secondary | ICD-10-CM

## 2018-10-05 DIAGNOSIS — I059 Rheumatic mitral valve disease, unspecified: Secondary | ICD-10-CM

## 2018-10-05 LAB — POCT INR: INR: 4 — AB (ref 2.0–3.0)

## 2018-10-25 ENCOUNTER — Telehealth: Payer: Self-pay

## 2018-10-25 NOTE — Telephone Encounter (Signed)
lmom for prescreen  

## 2018-10-26 ENCOUNTER — Ambulatory Visit (INDEPENDENT_AMBULATORY_CARE_PROVIDER_SITE_OTHER): Payer: Medicare Other | Admitting: Pharmacist

## 2018-10-26 ENCOUNTER — Other Ambulatory Visit: Payer: Self-pay

## 2018-10-26 DIAGNOSIS — Z7901 Long term (current) use of anticoagulants: Secondary | ICD-10-CM

## 2018-10-26 DIAGNOSIS — I059 Rheumatic mitral valve disease, unspecified: Secondary | ICD-10-CM | POA: Diagnosis not present

## 2018-10-26 DIAGNOSIS — I480 Paroxysmal atrial fibrillation: Secondary | ICD-10-CM | POA: Diagnosis not present

## 2018-10-26 DIAGNOSIS — Z952 Presence of prosthetic heart valve: Secondary | ICD-10-CM

## 2018-10-26 LAB — POCT INR: INR: 4 — AB (ref 2.0–3.0)

## 2018-11-08 ENCOUNTER — Telehealth: Payer: Self-pay

## 2018-11-08 NOTE — Telephone Encounter (Signed)
lmom for prescreen and drive thru aware  

## 2018-11-21 ENCOUNTER — Telehealth: Payer: Self-pay | Admitting: Pharmacist

## 2018-11-21 NOTE — Telephone Encounter (Signed)

## 2018-11-23 ENCOUNTER — Ambulatory Visit (INDEPENDENT_AMBULATORY_CARE_PROVIDER_SITE_OTHER): Payer: Medicare Other | Admitting: Pharmacist

## 2018-11-23 ENCOUNTER — Other Ambulatory Visit: Payer: Self-pay

## 2018-11-23 DIAGNOSIS — Z952 Presence of prosthetic heart valve: Secondary | ICD-10-CM | POA: Diagnosis not present

## 2018-11-23 DIAGNOSIS — I059 Rheumatic mitral valve disease, unspecified: Secondary | ICD-10-CM

## 2018-11-23 DIAGNOSIS — I480 Paroxysmal atrial fibrillation: Secondary | ICD-10-CM | POA: Diagnosis not present

## 2018-11-23 LAB — POCT INR: INR: 4.1 — AB (ref 2.0–3.0)

## 2018-11-23 NOTE — Patient Instructions (Signed)
Description   Spoke with pt and instructed pt to hold today's dose of Coumadin, then start taking 1 tablet every day.  Recheck in 2 weeks in the office.

## 2018-11-29 ENCOUNTER — Telehealth: Payer: Self-pay

## 2018-11-29 NOTE — Telephone Encounter (Signed)

## 2018-12-06 ENCOUNTER — Other Ambulatory Visit: Payer: Self-pay

## 2018-12-06 ENCOUNTER — Ambulatory Visit (INDEPENDENT_AMBULATORY_CARE_PROVIDER_SITE_OTHER): Payer: Medicare Other | Admitting: Pharmacist Clinician (PhC)/ Clinical Pharmacy Specialist

## 2018-12-06 DIAGNOSIS — I480 Paroxysmal atrial fibrillation: Secondary | ICD-10-CM | POA: Diagnosis not present

## 2018-12-06 DIAGNOSIS — Z952 Presence of prosthetic heart valve: Secondary | ICD-10-CM | POA: Diagnosis not present

## 2018-12-06 DIAGNOSIS — I059 Rheumatic mitral valve disease, unspecified: Secondary | ICD-10-CM

## 2018-12-06 DIAGNOSIS — Z7901 Long term (current) use of anticoagulants: Secondary | ICD-10-CM | POA: Diagnosis not present

## 2018-12-06 LAB — POCT INR: INR: 3.2 — AB (ref 2.0–3.0)

## 2018-12-07 ENCOUNTER — Telehealth (HOSPITAL_COMMUNITY): Payer: Self-pay | Admitting: Radiology

## 2018-12-07 NOTE — Telephone Encounter (Signed)
Left message to call office-Patient needs to schedule an echocardiogram.  

## 2018-12-24 ENCOUNTER — Other Ambulatory Visit: Payer: Self-pay | Admitting: Cardiology

## 2018-12-27 HISTORY — PX: TRANSTHORACIC ECHOCARDIOGRAM: SHX275

## 2019-01-01 ENCOUNTER — Telehealth: Payer: Self-pay

## 2019-01-01 NOTE — Telephone Encounter (Signed)

## 2019-01-04 DIAGNOSIS — Z85828 Personal history of other malignant neoplasm of skin: Secondary | ICD-10-CM | POA: Diagnosis not present

## 2019-01-04 DIAGNOSIS — D485 Neoplasm of uncertain behavior of skin: Secondary | ICD-10-CM | POA: Diagnosis not present

## 2019-01-04 DIAGNOSIS — C44722 Squamous cell carcinoma of skin of right lower limb, including hip: Secondary | ICD-10-CM | POA: Diagnosis not present

## 2019-01-08 ENCOUNTER — Other Ambulatory Visit: Payer: Self-pay

## 2019-01-08 ENCOUNTER — Ambulatory Visit (INDEPENDENT_AMBULATORY_CARE_PROVIDER_SITE_OTHER): Payer: Medicare Other | Admitting: *Deleted

## 2019-01-08 DIAGNOSIS — Z952 Presence of prosthetic heart valve: Secondary | ICD-10-CM | POA: Diagnosis not present

## 2019-01-08 DIAGNOSIS — Z5181 Encounter for therapeutic drug level monitoring: Secondary | ICD-10-CM

## 2019-01-08 DIAGNOSIS — I059 Rheumatic mitral valve disease, unspecified: Secondary | ICD-10-CM

## 2019-01-08 DIAGNOSIS — I480 Paroxysmal atrial fibrillation: Secondary | ICD-10-CM

## 2019-01-08 LAB — POCT INR: INR: 3.4 — AB (ref 2.0–3.0)

## 2019-01-08 NOTE — Patient Instructions (Signed)
Description   Continue with 1 tablet every day.  Recheck in 4 weeks.

## 2019-01-18 ENCOUNTER — Other Ambulatory Visit: Payer: Self-pay

## 2019-01-18 ENCOUNTER — Ambulatory Visit (HOSPITAL_COMMUNITY): Payer: Medicare Other | Attending: Cardiology

## 2019-01-18 DIAGNOSIS — I061 Rheumatic aortic insufficiency: Secondary | ICD-10-CM | POA: Insufficient documentation

## 2019-01-29 ENCOUNTER — Other Ambulatory Visit: Payer: Self-pay | Admitting: Cardiology

## 2019-01-29 DIAGNOSIS — D3141 Benign neoplasm of right ciliary body: Secondary | ICD-10-CM | POA: Diagnosis not present

## 2019-01-29 DIAGNOSIS — Z961 Presence of intraocular lens: Secondary | ICD-10-CM | POA: Diagnosis not present

## 2019-01-29 DIAGNOSIS — H04123 Dry eye syndrome of bilateral lacrimal glands: Secondary | ICD-10-CM | POA: Diagnosis not present

## 2019-01-29 DIAGNOSIS — H353131 Nonexudative age-related macular degeneration, bilateral, early dry stage: Secondary | ICD-10-CM | POA: Diagnosis not present

## 2019-02-05 ENCOUNTER — Ambulatory Visit (INDEPENDENT_AMBULATORY_CARE_PROVIDER_SITE_OTHER): Payer: Medicare Other | Admitting: Pharmacist

## 2019-02-05 ENCOUNTER — Other Ambulatory Visit: Payer: Self-pay

## 2019-02-05 DIAGNOSIS — I059 Rheumatic mitral valve disease, unspecified: Secondary | ICD-10-CM

## 2019-02-05 DIAGNOSIS — I480 Paroxysmal atrial fibrillation: Secondary | ICD-10-CM

## 2019-02-05 DIAGNOSIS — Z952 Presence of prosthetic heart valve: Secondary | ICD-10-CM | POA: Diagnosis not present

## 2019-02-05 LAB — POCT INR: INR: 3.5 — AB (ref 2.0–3.0)

## 2019-03-07 DIAGNOSIS — L218 Other seborrheic dermatitis: Secondary | ICD-10-CM | POA: Diagnosis not present

## 2019-03-07 DIAGNOSIS — Z85828 Personal history of other malignant neoplasm of skin: Secondary | ICD-10-CM | POA: Diagnosis not present

## 2019-03-07 DIAGNOSIS — L570 Actinic keratosis: Secondary | ICD-10-CM | POA: Diagnosis not present

## 2019-03-16 ENCOUNTER — Encounter: Payer: Self-pay | Admitting: Cardiology

## 2019-03-16 ENCOUNTER — Other Ambulatory Visit: Payer: Self-pay

## 2019-03-16 ENCOUNTER — Ambulatory Visit (INDEPENDENT_AMBULATORY_CARE_PROVIDER_SITE_OTHER): Payer: Medicare Other | Admitting: Cardiology

## 2019-03-16 ENCOUNTER — Ambulatory Visit (INDEPENDENT_AMBULATORY_CARE_PROVIDER_SITE_OTHER): Payer: Medicare Other | Admitting: Pharmacist Clinician (PhC)/ Clinical Pharmacy Specialist

## 2019-03-16 VITALS — BP 152/85 | HR 82 | Temp 97.9°F | Ht 72.0 in | Wt 156.6 lb

## 2019-03-16 DIAGNOSIS — I480 Paroxysmal atrial fibrillation: Secondary | ICD-10-CM | POA: Diagnosis not present

## 2019-03-16 DIAGNOSIS — I351 Nonrheumatic aortic (valve) insufficiency: Secondary | ICD-10-CM | POA: Diagnosis not present

## 2019-03-16 DIAGNOSIS — I10 Essential (primary) hypertension: Secondary | ICD-10-CM

## 2019-03-16 DIAGNOSIS — Z952 Presence of prosthetic heart valve: Secondary | ICD-10-CM | POA: Diagnosis not present

## 2019-03-16 DIAGNOSIS — I059 Rheumatic mitral valve disease, unspecified: Secondary | ICD-10-CM

## 2019-03-16 DIAGNOSIS — I4811 Longstanding persistent atrial fibrillation: Secondary | ICD-10-CM

## 2019-03-16 LAB — POCT INR: INR: 2.8 (ref 2.0–3.0)

## 2019-03-16 NOTE — Patient Instructions (Signed)
Medication Instructions:  NO CHANGES If you need a refill on your cardiac medications before your next appointment, please call your pharmacy.   Lab work: NOT NEEDED   Testing/Procedures: NOT NEEDED  Follow-Up: At [facility], you and your health needs are our priority.  As part of our continuing mission to provide you with exceptional heart care, we have created designated Provider Care Teams.  These Care Teams include your primary Cardiologist (physician) and Advanced Practice Providers (APPs -  Physician Assistants and Nurse Practitioners) who all work together to provide you with the care you need, when you need it. . You will need a follow up appointment in  10 months- July 2021.  Please call our office 2 months in advance to schedule this appointment.  You may see Glenetta Hew, MD or one of the following Advanced Practice Providers on your designated Care Team:   . Rosaria Ferries, PA-C . Jory Sims, DNP, ANP  Any Other Special Instructions Will Be Listed Below (If Applicable).

## 2019-03-16 NOTE — Progress Notes (Signed)
PCP: [name], MD  Clinic Note: Chief Complaint  Patient presents with  . Follow-up    Doing much better now.  . Atrial Fibrillation  . Cardiac Valve Problem    Status post mechanical mitral valve    HPI: Richard Spears is a 85 y.o. male with a with longstanding history of permanent A. fib and is s/p MVR ( Mechanical) who presents for annual follow-up He previously saw Dr. Ilda Foil, then Dr. Lendell Caprice. (changed to Dr. Ellyn Hack b/c his wife is also a patient) A. fib. Rate controlled on Zebeta, warfarin for anticoagulation. CHADS2VASC=2 (age x 2) Status post Mechanical Mitral Valve Replacement. 1997 - on Warfarin Venous deficiency, wears support stockings.  Mild orthostatic dizziness.   Moderate AI stable from 2012/ - 2017  Richard Spears was seen in July 2019 -> he was doing well no complaints.  He had a mechanical fall and broke his hip having to have hip replacement from anterior approach and did well.  Was having intermittent episodes of blood pressures going high but were short-lived.  He did not take any PRN hydralazine for it.  Some positional dizziness still.  Was noted to be deconditioned following his surgery and taking a long time to recover his energy level.  No symptoms of A. fib.  Recent Hospitalizations:   None  Studies Personally Reviewed - (if available, images/films reviewed: From Epic Chart or Care Everywhere)  Echo 12/2018: Normal EF 55-60%.  Unable to assess diastolic function because of A. fib. Normal RV function, but moderately elevated RVSP.  Severe biatrial enlargement. S/P St Jude bileaflet mechanical MVR that appears to be functioning normally. Mitral valve regurgitation cannot assess due to mechanical valve shadowing. MV Mean grad: 7.0 mmHg MV Area (PHT): 3.38 cm (stable for valve).  Mild aortic sclerosis but no stenosis, mild to moderate AI. ->  Compared to prior echo, RV pressures elevated.   Interval History: Richard Spears returns today noting  that he still has ups and downs with his blood pressures but he is peripherally happy with the fact that he is not having any headache or dizziness.  He is probably more worried about low blood pressures and high blood pressures because of positional dizziness that that he has on occasion.  He is cut back to full functioning now, having fully recovered from his hip surgery. Really the only cardiac symptom he notes is that he had some mild swelling in his feet but what he really describes as, a numbing sensation in his feet that is almost like a sock on his foot.  The pads of his feet are affected mostly.  He denies any claudication symptoms.  He is able to walk around now without having issues besides the fact that his balance is off a bit... Otherwise, he walks just about every night when the weather is cooler.  He does not like going out in the heat of the day.  He really is not noticing any exertional dyspnea or chest pain/pressure.  No heart failure symptoms of PND or orthopnea and only the mild swelling.  No sensation whatsoever being in A. fib either fast heart rates or irregular heartbeats.  No further falls.  No syncope/near syncope or TIA/amaurosis fugax.  No bleeding issues on warfarin such as melena, hematochezia, hematuria, or epistaxis.  No significant bruising.   ROS: A comprehensive was performed. Review of Systems  Constitutional: Negative for malaise/fatigue (Pretty much back to baseline).  HENT: Negative for nosebleeds.   Respiratory:  Positive for shortness of breath (Only with vigorous exertion). Negative for cough and hemoptysis.   Cardiovascular: Positive for leg swelling (Controlled with Lasix per HPI). Negative for chest pain.  Gastrointestinal: Positive for constipation (If he is not careful). Negative for heartburn.  Genitourinary: Negative for urgency.  Musculoskeletal: Positive for back pain. Negative for joint pain and myalgias.       Recovering relatively well post-op  HIP Hemiarthroplasty.   Neurological: Positive for dizziness (Positional).  Psychiatric/Behavioral: Negative for depression and memory loss. The patient is not nervous/anxious and does not have insomnia.   All other systems reviewed and are negative.   I have reviewed and (if needed) personally updated the patient's problem list, medications, allergies, past medical and surgical history, social and family history.   Past Medical History:  Diagnosis Date  . Anemia    leakoppenia  . BPH (benign prostatic hypertrophy)   . Bullous pemphigoid    [name], March 2011, right forearm squamous cell carcinoma  . Chronic anticoagulation    systemic  . Colon polyp    transverse, 2002  . History of peptic ulcer    remote, 3/95  . Hx of actinic keratosis   . Hx of basal cell carcinoma   . Hx of squamous cell carcinoma of skin   . Hyperlipidemia   . Left inguinal hernia   . Moderate aortic insufficiency 2009   audible aortic insufficiency on 1/09 echo  . PAF (paroxysmal atrial fibrillation) ([place]) 01/17/2014   On Warfarin.  . S/P mitral valve replacement with metallic valve 99991111   INR goal 2.5-3.5, St Jude,   . 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 Electrodesiccation and Curettage and Shave Biopsy Right    Right medial, anterio tibia: Well differentiated Squamous Cell  . hip replacements Left    10 years ago  . MITRAL VALVE REPLACEMENT  03/1996   St. Jude mechanical valve  . TOTAL HIP ARTHROPLASTY Right 10/12/2017   Procedure: RIGHT TOTAL HIP ARTHROPLASTY ANTERIOR APPROACH;  Surgeon: Gaynelle Arabian, MD;  Location: [facility];  Service: Orthopedics;  Laterality: Right;  . TRANSTHORACIC ECHOCARDIOGRAM  12/2018   Unable to assess diastolic function because of A. fib. Normal RV function, but moderately elevated RVSP.  Severe biatrial enlargement. S/P St Jude bileaflet mechanical MVR that appears to  be functioning normally. Mitral valve regurgitation cannot assess due to mechanical valve shadowing. MV Mean grad: 7.0 mmHg MV Area (PHT): 3.38 cm (stable for valve).  Mild Ao Sclerosis, Mild-Mod AI  . TRANSTHORACIC ECHOCARDIOGRAM  08/'17; 10/'18    a) Mild conc LVH. EF 55-60%. No RWMA. Mod AI. Mechanical MV prosthesis functioning properly. LAD dilation.;; b)  EF 55-60%.  Mo AI.  Bileaflet Saint Jude mechanical MV with no paravalvular leak.  Severe LA dilation.  Minimally elevated PAP    Current Meds  Medication Sig  . bisoprolol (ZEBETA) 5 MG tablet TAKE 1 TABLET DAILY  . furosemide (LASIX) 40 MG tablet TAKE 1 TABLET DAILY AND TAKE AN ADDITIONAL ONE-HALF TABLET THREE DAYS A WEEK  . hydrALAZINE (APRESOLINE) 25 MG tablet TAKE 1 TABLET DAILY AT LUNCHTIME. TAKE AN ADDITIONAL TABLET AS NEEDED FOR SYSTOLIC BLOOD PRESSURE GREATER THAN 160 MMHG  . isosorbide mononitrate (IMDUR) 30 MG 24 hr tablet Take 1 tablet (30 mg total) by mouth daily. Pt needs to call and make appt before anymore refills. - 1st attempt  .  LORazepam (ATIVAN) 1 MG tablet Take 1 tablet (1 mg total) by mouth every 8 (eight) hours as needed for anxiety.  . Multiple Vitamins-Minerals (MULTIVITAMIN PO) Take 1 tablet by mouth daily.  . Multiple Vitamins-Minerals (PRESERVISION AREDS 2 PO) Take 1 capsule by mouth daily.  Marland Kitchen warfarin (COUMADIN) 5 MG tablet Take 5 mg by mouth as directed.   . warfarin (COUMADIN) 5 MG tablet TAKE ONE TO ONE AND ONE-HALF TABLETS DAILY AS DIRECTED  . [DISCONTINUED] traMADol (ULTRAM) 50 MG tablet Take 50 mg by mouth every 6 (six) hours as needed for moderate pain.    Allergies  Allergen Reactions  . Flexeril [Cyclobenzaprine] Diarrhea    Social History   Tobacco Use  . Smoking status: Former Smoker    Packs/day: 1.00    Years: 8.00    Pack years: 8.00    Types: Cigarettes    Quit date: 10/31/1954    Years since quitting: 64.4  . Smokeless tobacco: Never Used  Substance Use Topics  . Alcohol use: Yes     Alcohol/week: 0.0 standard drinks    Comment: 1-2 drinks per day  . Drug use: Never   Social History   Social History Narrative   Patient lives at [facility], With his wife - Meryl Crutch.    family history includes COPD in his brother; Cancer in his brother; Hypertension in his mother; Lung cancer in his sister; Lupus in his son; Other in his sister.  Wt Readings from Last 3 Encounters:  03/16/19 156 lb 9.6 oz (71 kg)  01/18/18 156 lb 3.2 oz (70.9 kg)  10/28/17 150 lb (68 kg)    PHYSICAL EXAM BP (!) 152/85   Pulse 82   Temp 97.9 F (36.6 C)   Ht 6' (1.829 m)   Wt 156 lb 9.6 oz (71 kg)   SpO2 96%   BMI 21.24 kg/m  Physical Exam  Constitutional: He is oriented to person, place, and time. He appears well-developed and well-nourished. No distress.  Appears younger than his stated age.  Well-groomed  HENT:  Head: Normocephalic and atraumatic.  Mouth/Throat: No oropharyngeal exudate.  Neck: Neck supple. No hepatojugular reflux and no JVD present. Carotid bruit is not present.  Cardiovascular: Normal rate and intact distal pulses. An irregularly irregular rhythm present.  Occasional extrasystoles are present. PMI is not displaced. Exam reveals no gallop.  Murmur heard.  Low-pitched rumbling holosystolic murmur is present with a grade of 1/6 at the upper left sternal border and lower left sternal border.  Low-pitched holodiastolic murmur is present with a grade of 1/6 at the upper right sternal border. Crisp metallic S1, and borderline S2  Pulmonary/Chest: Effort normal and breath sounds normal. No respiratory distress. He has no wheezes. He has no rales.  Abdominal: Soft. Bowel sounds are normal. He exhibits no distension. There is no abdominal tenderness. There is no rebound.  Musculoskeletal: Normal range of motion.        General: No edema (Trivial pedal edema).  Neurological: He is alert and oriented to person, place, and time.  Psychiatric: He has a normal mood and  affect. His behavior is normal. Judgment and thought content normal.  Nursing note and vitals reviewed.   Adult ECG Report N/A  Other studies Reviewed: Additional studies/ records that were reviewed today include:  Recent Labs:   Lab Results  Component Value Date   CREATININE 1.38 10/20/2017   BUN 25 (A) 10/20/2017   NA 130 10/20/2017   K 4.2 10/20/2017  CL 103 10/14/2017   CO2 21 (L) 10/14/2017   No results found for: CHOL, HDL, LDLCALC, LDLDIRECT, TRIG, CHOLHDL  ASSESSMENT / PLAN:  Pretty stable elderly gentleman with no major complaints today.  Echocardiogram was relatively stable as far as his mechanical mitral valve and aortic insufficiency.  A. fib rate is controlled.  Blood pressure still up and down, but manageable with his orthostatic hypotension issues.  For now no medication changes.  We also discussed the need for follow-up echocardiogram which we both agreed is only necessary if he were to have worsening symptoms.  Problem List Items Addressed This Visit    S/P MVR (mitral valve replacement) (Chronic)    Stable mechanical valve.  On warfarin for the valve and also for A. fib.  Because of the valve, we could not convert to [place].  Would not be due to follow-up an echo until 2022 at which time he will be 85 years old.  He indicated that he did not think that would be necessary unless he is having symptoms, and I agree.      Longstanding persistent atrial fibrillation: CHA2DS2-VASc Score 3 - Primary (Chronic)    Remains in A. fib.  Rate 65 bpm.  Plan: Continue current dose of beta-blocker plus warfarin      Relevant Orders   EKG 12-Lead (Completed)   AI (aortic insufficiency) (Chronic)    Stable mild to moderate on echo.  Probably follow-up echo from here on out is unnecessary unless he has symptoms.  Would otherwise be due when he is 85 years old.      Essential hypertension (Chronic)    Still has ups and downs of blood pressure.  A bit high today, but I am  reluctant to treat due to orthostatic hypotension.  He has been reluctant to use PRN doses of hydralazine because for the most part his pressures usually go back to normal.         Current medicines are reviewed at length with the patient today. (+/- concerns none  Patient Instructions  Medication Instructions:  NO CHANGES If you need a refill on your cardiac medications before your next appointment, please call your pharmacy.   Lab work: NOT NEEDED   Testing/Procedures: NOT NEEDED  Follow-Up: At [facility], you and your health needs are our priority.  As part of our continuing mission to provide you with exceptional heart care, we have created designated Provider Care Teams.  These Care Teams include your primary Cardiologist (physician) and Advanced Practice Providers (APPs -  Physician Assistants and Nurse Practitioners) who all work together to provide you with the care you need, when you need it. . You will need a follow up appointment in  10 months- July 2021.  Please call our office 2 months in advance to schedule this appointment.  You may see Glenetta Hew, MD or one of the following Advanced Practice Providers on your designated Care Team:   . Rosaria Ferries, PA-C . Jory Sims, DNP, ANP  Any Other Special Instructions Will Be Listed Below (If Applicable).   Studies Ordered:   Orders Placed This Encounter  Procedures  . EKG 12-Lead      Glenetta Hew, M.D., M.S. Interventional Cardiologist   Pager # [phone] Phone # [phone] [address]. [place], [place] [address]

## 2019-03-23 ENCOUNTER — Encounter: Payer: Self-pay | Admitting: Cardiology

## 2019-03-23 NOTE — Assessment & Plan Note (Signed)
Stable mild to moderate on echo.  Probably follow-up echo from here on out is unnecessary unless he has symptoms.  Would otherwise be due when he is 85 years old.

## 2019-03-23 NOTE — Assessment & Plan Note (Signed)
Still has ups and downs of blood pressure.  A bit high today, but I am reluctant to treat due to orthostatic hypotension.  He has been reluctant to use PRN doses of hydralazine because for the most part his pressures usually go back to normal.

## 2019-03-23 NOTE — Assessment & Plan Note (Addendum)
Stable mechanical valve.  On warfarin for the valve and also for A. fib.  Because of the valve, we could not convert to [place].  Would not be due to follow-up an echo until 2022 at which time he will be 85 years old.  He indicated that he did not think that would be necessary unless he is having symptoms, and I agree.

## 2019-03-23 NOTE — Assessment & Plan Note (Signed)
Remains in A. fib.  Rate 65 bpm.  Plan: Continue current dose of beta-blocker plus warfarin

## 2019-03-28 ENCOUNTER — Other Ambulatory Visit: Payer: Self-pay | Admitting: Cardiology

## 2019-03-29 ENCOUNTER — Other Ambulatory Visit: Payer: Self-pay | Admitting: Cardiology

## 2019-03-29 NOTE — Telephone Encounter (Signed)
Pt calling stating that he needs a refill on isosorbide 30 day supply sent to [facility] and a 90 day supply with refills sent to [facility] mail order pharmacy. Please address

## 2019-04-02 MED ORDER — ISOSORBIDE MONONITRATE ER 30 MG PO TB24: 30.0000 mg | ORAL_TABLET | Freq: Every day | ORAL | 1 refills | Status: DC

## 2019-04-02 MED ORDER — ISOSORBIDE MONONITRATE ER 30 MG PO TB24: 30.0000 mg | ORAL_TABLET | Freq: Every day | ORAL | 0 refills | Status: DC

## 2019-04-02 NOTE — Telephone Encounter (Signed)
Refill sent to local and mailin

## 2019-04-12 DIAGNOSIS — N400 Enlarged prostate without lower urinary tract symptoms: Secondary | ICD-10-CM | POA: Diagnosis not present

## 2019-04-12 DIAGNOSIS — I480 Paroxysmal atrial fibrillation: Secondary | ICD-10-CM | POA: Diagnosis not present

## 2019-04-12 DIAGNOSIS — D649 Anemia, unspecified: Secondary | ICD-10-CM | POA: Diagnosis not present

## 2019-04-12 DIAGNOSIS — N183 Chronic kidney disease, stage 3 unspecified: Secondary | ICD-10-CM | POA: Diagnosis not present

## 2019-04-12 DIAGNOSIS — I10 Essential (primary) hypertension: Secondary | ICD-10-CM | POA: Diagnosis not present

## 2019-04-29 ENCOUNTER — Other Ambulatory Visit: Payer: Self-pay | Admitting: Cardiology

## 2019-04-30 ENCOUNTER — Other Ambulatory Visit: Payer: Self-pay

## 2019-04-30 ENCOUNTER — Ambulatory Visit (INDEPENDENT_AMBULATORY_CARE_PROVIDER_SITE_OTHER): Payer: Medicare Other | Admitting: Pharmacist Clinician (PhC)/ Clinical Pharmacy Specialist

## 2019-04-30 DIAGNOSIS — I4811 Longstanding persistent atrial fibrillation: Secondary | ICD-10-CM

## 2019-04-30 DIAGNOSIS — I059 Rheumatic mitral valve disease, unspecified: Secondary | ICD-10-CM

## 2019-04-30 DIAGNOSIS — I480 Paroxysmal atrial fibrillation: Secondary | ICD-10-CM

## 2019-04-30 DIAGNOSIS — Z952 Presence of prosthetic heart valve: Secondary | ICD-10-CM

## 2019-04-30 LAB — POCT INR: INR: 2.8 (ref 2.0–3.0)

## 2019-05-09 ENCOUNTER — Ambulatory Visit (INDEPENDENT_AMBULATORY_CARE_PROVIDER_SITE_OTHER): Payer: Medicare Other | Admitting: Otolaryngology

## 2019-05-09 ENCOUNTER — Other Ambulatory Visit: Payer: Self-pay

## 2019-05-09 ENCOUNTER — Encounter (INDEPENDENT_AMBULATORY_CARE_PROVIDER_SITE_OTHER): Payer: Self-pay | Admitting: Otolaryngology

## 2019-05-09 VITALS — Temp 97.5°F

## 2019-05-09 DIAGNOSIS — J310 Chronic rhinitis: Secondary | ICD-10-CM

## 2019-05-09 DIAGNOSIS — H903 Sensorineural hearing loss, bilateral: Secondary | ICD-10-CM | POA: Diagnosis not present

## 2019-05-09 NOTE — Progress Notes (Signed)
HPI: Richard Spears is a 85 y.o. male who presents for evaluation of hearing.  His wife apparently complains about his hearing.  Occasionally has some popping in his ears but he feels like he hears okay otherwise.  He apparently had a hearing test over 5 years ago that showed mild hearing loss.  He also complains of some trouble breathing through his nose at night.  He has previously tried [facility] with minimal benefit.  He does complain of some postnasal drainage that improves his breathing when he clears this.  Past Medical History:  Diagnosis Date  . Anemia    leakoppenia  . BPH (benign prostatic hypertrophy)   . Bullous pemphigoid    [name], March 2011, right forearm squamous cell carcinoma  . Chronic anticoagulation    systemic  . Colon polyp    transverse, 2002  . History of peptic ulcer    remote, 3/95  . Hx of actinic keratosis   . Hx of basal cell carcinoma   . Hx of squamous cell carcinoma of skin   . Hyperlipidemia   . Left inguinal hernia   . Moderate aortic insufficiency 2009   audible aortic insufficiency on 1/09 echo  . PAF (paroxysmal atrial fibrillation) ([place]) 01/17/2014   On Warfarin.  . S/P mitral valve replacement with metallic valve 99991111   INR goal 2.5-3.5, St Jude,   . 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 Electrodesiccation and Curettage and Shave Biopsy Right    Right medial, anterio tibia: Well differentiated Squamous Cell  . hip replacements Left    10 years ago  . MITRAL VALVE REPLACEMENT  03/1996   St. Jude mechanical valve  . TOTAL HIP ARTHROPLASTY Right 10/12/2017   Procedure: RIGHT TOTAL HIP ARTHROPLASTY ANTERIOR APPROACH;  Surgeon: Gaynelle Arabian, MD;  Location: [facility];  Service: Orthopedics;  Laterality: Right;  . TRANSTHORACIC ECHOCARDIOGRAM  12/2018   Unable to assess diastolic function because of A. fib. Normal RV function, but moderately  elevated RVSP.  Severe biatrial enlargement. S/P St Jude bileaflet mechanical MVR that appears to be functioning normally. Mitral valve regurgitation cannot assess due to mechanical valve shadowing. MV Mean grad: 7.0 mmHg MV Area (PHT): 3.38 cm (stable for valve).  Mild Ao Sclerosis, Mild-Mod AI  . TRANSTHORACIC ECHOCARDIOGRAM  08/'17; 10/'18    a) Mild conc LVH. EF 55-60%. No RWMA. Mod AI. Mechanical MV prosthesis functioning properly. LAD dilation.;; b)  EF 55-60%.  Mo AI.  Bileaflet Saint Jude mechanical MV with no paravalvular leak.  Severe LA dilation.  Minimally elevated PAP   Social History   Socioeconomic History  . Marital status: Married    Spouse name: Not on file  . Number of children: 2  . Years of education: Not on file  . Highest education level: Not on file  Occupational History  . Occupation: retired  Scientific laboratory technician  . Financial resource strain: Not on file  . Food insecurity    Worry: Not on file    Inability: Not on file  . Transportation needs    Medical: Not on file    Non-medical: Not on file  Tobacco Use  . Smoking status: Former Smoker    Packs/day: 1.00    Years: 13.00    Pack years: 13.00    Types: Cigarettes    Start date: 83    Quit date: 1960  Years since quitting: 60.9  . Smokeless tobacco: Never Used  Substance and Sexual Activity  . Alcohol use: Yes    Alcohol/week: 0.0 standard drinks    Comment: 1-2 drinks per day  . Drug use: Never  . Sexual activity: Not on file  Lifestyle  . Physical activity    Days per week: Not on file    Minutes per session: Not on file  . Stress: Not on file  Relationships  . Social Herbalist on phone: Not on file    Gets together: Not on file    Attends religious service: Not on file    Active member of club or organization: Not on file    Attends meetings of clubs or organizations: Not on file    Relationship status: Not on file  Other Topics Concern  . Not on file  Social History Narrative    Patient lives at [facility], With his wife Meryl Crutch.   Family History  Problem Relation Age of Onset  . Hypertension Mother   . Lung cancer Sister   . COPD Brother   . Cancer Brother   . Other Sister        polio  . Lupus Son    Allergies  Allergen Reactions  . Flexeril [Cyclobenzaprine] Diarrhea   Prior to Admission medications   Medication Sig Start Date End Date Taking? Authorizing Provider  bisoprolol (ZEBETA) 5 MG tablet TAKE 1 TABLET DAILY 05/10/18  Yes Leonie Man, MD  furosemide (LASIX) 40 MG tablet TAKE 1 TABLET DAILY AND TAKE AN ADDITIONAL ONE-HALF TABLET THREE DAYS A WEEK 09/04/18  Yes Leonie Man, MD  hydrALAZINE (APRESOLINE) 25 MG tablet TAKE 1 TABLET DAILY AT LUNCHTIME. TAKE AN ADDITIONAL TABLET AS NEEDED FOR SYSTOLIC BLOOD PRESSURE GREATER THAN 160 MMHG 12/25/18  Yes Leonie Man, MD  isosorbide mononitrate (IMDUR) 30 MG 24 hr tablet TAKE 1 TABLET DAILY (NEED TO CALL AND MAKE APPOINTMENT BEFORE ANYMORE REFILLS. FIRST ATTEMPT) 04/02/19  Yes Leonie Man, MD  isosorbide mononitrate (IMDUR) 30 MG 24 hr tablet Take 1 tablet (30 mg total) by mouth daily. 04/02/19  Yes Leonie Man, MD  LORazepam (ATIVAN) 1 MG tablet Take 1 tablet (1 mg total) by mouth every 8 (eight) hours as needed for anxiety. 10/14/17  Yes Oswald Hillock, MD  Multiple Vitamins-Minerals (MULTIVITAMIN PO) Take 1 tablet by mouth daily.   Yes [provider]  Multiple Vitamins-Minerals (PRESERVISION AREDS 2 PO) Take 1 capsule by mouth daily.   Yes [provider]  warfarin (COUMADIN) 5 MG tablet Take 5 mg by mouth as directed.    Yes [provider]  warfarin (COUMADIN) 5 MG tablet TAKE ONE TO ONE AND ONE-HALF TABLETS DAILY AS DIRECTED 04/30/19  Yes Leonie Man, MD     Positive ROS: Otherwise negative  All other systems have been reviewed and were otherwise negative with the exception of those mentioned in the HPI and as above.  Physical  Exam: General: Alert, no acute distress Ears: Ear canals are clear bilaterally with intact, clear TMs.  Minimal wax buildup which is nonobstructing. Nasal: Clear nasal passages.  Mild rhinitis.  Both middle meatus regions are clear and clear nasal passages otherwise. Oral: Clear oropharynx Neck: No palpable adenopathy or masses Audiogram demonstrated essentially normal hearing in both ears with mild presbycusis.  SRT's were 10 DB on the right and 15 DB on the left.  Procedures  Assessment: Minimal  presbycusis. Mild rhinitis  Plan: Reassured him of essentially normal hearing with no need for amplification. Suggested trying Nasacort 2 sprays each nostril at night if he is having trouble breathing at night or use of saline irrigation to help with the mucus. He will follow-up as needed  Radene Journey, MD

## 2019-05-11 ENCOUNTER — Encounter (INDEPENDENT_AMBULATORY_CARE_PROVIDER_SITE_OTHER): Payer: Self-pay

## 2019-06-11 ENCOUNTER — Ambulatory Visit (INDEPENDENT_AMBULATORY_CARE_PROVIDER_SITE_OTHER): Payer: Medicare Other | Admitting: Pharmacist

## 2019-06-11 ENCOUNTER — Other Ambulatory Visit: Payer: Self-pay

## 2019-06-11 DIAGNOSIS — I480 Paroxysmal atrial fibrillation: Secondary | ICD-10-CM | POA: Diagnosis not present

## 2019-06-11 DIAGNOSIS — I059 Rheumatic mitral valve disease, unspecified: Secondary | ICD-10-CM | POA: Diagnosis not present

## 2019-06-11 DIAGNOSIS — I4811 Longstanding persistent atrial fibrillation: Secondary | ICD-10-CM | POA: Diagnosis not present

## 2019-06-11 DIAGNOSIS — Z952 Presence of prosthetic heart valve: Secondary | ICD-10-CM | POA: Diagnosis not present

## 2019-06-11 LAB — POCT INR: INR: 2.9 (ref 2.0–3.0)

## 2019-06-17 ENCOUNTER — Other Ambulatory Visit: Payer: Self-pay | Admitting: Cardiology

## 2019-06-18 NOTE — Telephone Encounter (Signed)
Rx(s) sent to pharmacy electronically.  

## 2019-06-25 ENCOUNTER — Other Ambulatory Visit: Payer: Self-pay | Admitting: *Deleted

## 2019-06-25 MED ORDER — ISOSORBIDE MONONITRATE ER 30 MG PO TB24: 30.0000 mg | ORAL_TABLET | Freq: Every day | ORAL | 2 refills | Status: DC

## 2019-07-02 DIAGNOSIS — Z23 Encounter for immunization: Secondary | ICD-10-CM | POA: Diagnosis not present

## 2019-07-13 DIAGNOSIS — M79661 Pain in right lower leg: Secondary | ICD-10-CM | POA: Diagnosis not present

## 2019-07-19 DIAGNOSIS — M25561 Pain in right knee: Secondary | ICD-10-CM | POA: Diagnosis not present

## 2019-07-19 DIAGNOSIS — M79661 Pain in right lower leg: Secondary | ICD-10-CM | POA: Diagnosis not present

## 2019-07-23 ENCOUNTER — Ambulatory Visit (INDEPENDENT_AMBULATORY_CARE_PROVIDER_SITE_OTHER): Payer: Medicare Other | Admitting: Pharmacist Clinician (PhC)/ Clinical Pharmacy Specialist

## 2019-07-23 ENCOUNTER — Other Ambulatory Visit: Payer: Self-pay

## 2019-07-23 DIAGNOSIS — Z952 Presence of prosthetic heart valve: Secondary | ICD-10-CM | POA: Diagnosis not present

## 2019-07-23 DIAGNOSIS — I4811 Longstanding persistent atrial fibrillation: Secondary | ICD-10-CM | POA: Diagnosis not present

## 2019-07-23 DIAGNOSIS — I059 Rheumatic mitral valve disease, unspecified: Secondary | ICD-10-CM | POA: Diagnosis not present

## 2019-07-23 DIAGNOSIS — I480 Paroxysmal atrial fibrillation: Secondary | ICD-10-CM

## 2019-07-23 LAB — POCT INR: INR: 4.2 — AB (ref 2.0–3.0)

## 2019-07-24 DIAGNOSIS — M25561 Pain in right knee: Secondary | ICD-10-CM | POA: Diagnosis not present

## 2019-07-24 DIAGNOSIS — R2681 Unsteadiness on feet: Secondary | ICD-10-CM | POA: Diagnosis not present

## 2019-07-24 DIAGNOSIS — M6281 Muscle weakness (generalized): Secondary | ICD-10-CM | POA: Diagnosis not present

## 2019-07-25 DIAGNOSIS — M6281 Muscle weakness (generalized): Secondary | ICD-10-CM | POA: Diagnosis not present

## 2019-07-25 DIAGNOSIS — R2681 Unsteadiness on feet: Secondary | ICD-10-CM | POA: Diagnosis not present

## 2019-07-25 DIAGNOSIS — M25561 Pain in right knee: Secondary | ICD-10-CM | POA: Diagnosis not present

## 2019-07-29 ENCOUNTER — Other Ambulatory Visit: Payer: Self-pay | Admitting: Cardiology

## 2019-07-30 ENCOUNTER — Other Ambulatory Visit: Payer: Self-pay

## 2019-07-30 DIAGNOSIS — Z23 Encounter for immunization: Secondary | ICD-10-CM | POA: Diagnosis not present

## 2019-08-02 DIAGNOSIS — M25561 Pain in right knee: Secondary | ICD-10-CM | POA: Diagnosis not present

## 2019-08-02 DIAGNOSIS — R2681 Unsteadiness on feet: Secondary | ICD-10-CM | POA: Diagnosis not present

## 2019-08-02 DIAGNOSIS — M6281 Muscle weakness (generalized): Secondary | ICD-10-CM | POA: Diagnosis not present

## 2019-08-02 DIAGNOSIS — M7041 Prepatellar bursitis, right knee: Secondary | ICD-10-CM | POA: Diagnosis not present

## 2019-08-07 DIAGNOSIS — R2681 Unsteadiness on feet: Secondary | ICD-10-CM | POA: Diagnosis not present

## 2019-08-07 DIAGNOSIS — M6281 Muscle weakness (generalized): Secondary | ICD-10-CM | POA: Diagnosis not present

## 2019-08-07 DIAGNOSIS — M25561 Pain in right knee: Secondary | ICD-10-CM | POA: Diagnosis not present

## 2019-08-07 DIAGNOSIS — M7041 Prepatellar bursitis, right knee: Secondary | ICD-10-CM | POA: Diagnosis not present

## 2019-08-09 DIAGNOSIS — M6281 Muscle weakness (generalized): Secondary | ICD-10-CM | POA: Diagnosis not present

## 2019-08-09 DIAGNOSIS — M25561 Pain in right knee: Secondary | ICD-10-CM | POA: Diagnosis not present

## 2019-08-09 DIAGNOSIS — M7041 Prepatellar bursitis, right knee: Secondary | ICD-10-CM | POA: Diagnosis not present

## 2019-08-09 DIAGNOSIS — R2681 Unsteadiness on feet: Secondary | ICD-10-CM | POA: Diagnosis not present

## 2019-08-13 ENCOUNTER — Other Ambulatory Visit: Payer: Self-pay

## 2019-08-13 ENCOUNTER — Ambulatory Visit (INDEPENDENT_AMBULATORY_CARE_PROVIDER_SITE_OTHER): Payer: Medicare Other | Admitting: Pharmacist Clinician (PhC)/ Clinical Pharmacy Specialist

## 2019-08-13 DIAGNOSIS — I480 Paroxysmal atrial fibrillation: Secondary | ICD-10-CM

## 2019-08-13 DIAGNOSIS — I4811 Longstanding persistent atrial fibrillation: Secondary | ICD-10-CM | POA: Diagnosis not present

## 2019-08-13 DIAGNOSIS — Z952 Presence of prosthetic heart valve: Secondary | ICD-10-CM

## 2019-08-13 DIAGNOSIS — I059 Rheumatic mitral valve disease, unspecified: Secondary | ICD-10-CM

## 2019-08-13 LAB — POCT INR: INR: 3.4 — AB (ref 2.0–3.0)

## 2019-08-14 DIAGNOSIS — M25561 Pain in right knee: Secondary | ICD-10-CM | POA: Diagnosis not present

## 2019-08-14 DIAGNOSIS — M7041 Prepatellar bursitis, right knee: Secondary | ICD-10-CM | POA: Diagnosis not present

## 2019-08-14 DIAGNOSIS — M6281 Muscle weakness (generalized): Secondary | ICD-10-CM | POA: Diagnosis not present

## 2019-08-14 DIAGNOSIS — R2681 Unsteadiness on feet: Secondary | ICD-10-CM | POA: Diagnosis not present

## 2019-08-20 DIAGNOSIS — R2681 Unsteadiness on feet: Secondary | ICD-10-CM | POA: Diagnosis not present

## 2019-08-20 DIAGNOSIS — M7041 Prepatellar bursitis, right knee: Secondary | ICD-10-CM | POA: Diagnosis not present

## 2019-08-20 DIAGNOSIS — M6281 Muscle weakness (generalized): Secondary | ICD-10-CM | POA: Diagnosis not present

## 2019-08-20 DIAGNOSIS — M25561 Pain in right knee: Secondary | ICD-10-CM | POA: Diagnosis not present

## 2019-09-02 ENCOUNTER — Other Ambulatory Visit: Payer: Self-pay | Admitting: Cardiology

## 2019-09-10 ENCOUNTER — Ambulatory Visit (INDEPENDENT_AMBULATORY_CARE_PROVIDER_SITE_OTHER): Payer: Medicare Other | Admitting: Pharmacist

## 2019-09-10 ENCOUNTER — Other Ambulatory Visit: Payer: Self-pay

## 2019-09-10 DIAGNOSIS — Z952 Presence of prosthetic heart valve: Secondary | ICD-10-CM

## 2019-09-10 DIAGNOSIS — I059 Rheumatic mitral valve disease, unspecified: Secondary | ICD-10-CM

## 2019-09-10 DIAGNOSIS — I4811 Longstanding persistent atrial fibrillation: Secondary | ICD-10-CM

## 2019-09-10 DIAGNOSIS — I480 Paroxysmal atrial fibrillation: Secondary | ICD-10-CM

## 2019-09-10 LAB — POCT INR: INR: 3.4 — AB (ref 2.0–3.0)

## 2019-10-08 ENCOUNTER — Other Ambulatory Visit: Payer: Self-pay

## 2019-10-08 ENCOUNTER — Ambulatory Visit (INDEPENDENT_AMBULATORY_CARE_PROVIDER_SITE_OTHER): Payer: Medicare Other | Admitting: Pharmacist

## 2019-10-08 DIAGNOSIS — Z952 Presence of prosthetic heart valve: Secondary | ICD-10-CM

## 2019-10-08 DIAGNOSIS — I4811 Longstanding persistent atrial fibrillation: Secondary | ICD-10-CM

## 2019-10-08 DIAGNOSIS — I059 Rheumatic mitral valve disease, unspecified: Secondary | ICD-10-CM

## 2019-10-08 DIAGNOSIS — I480 Paroxysmal atrial fibrillation: Secondary | ICD-10-CM | POA: Diagnosis not present

## 2019-10-08 LAB — POCT INR: INR: 2.9 (ref 2.0–3.0)

## 2019-11-12 ENCOUNTER — Other Ambulatory Visit: Payer: Self-pay

## 2019-11-12 ENCOUNTER — Ambulatory Visit (INDEPENDENT_AMBULATORY_CARE_PROVIDER_SITE_OTHER): Payer: Medicare Other | Admitting: Pharmacist Clinician (PhC)/ Clinical Pharmacy Specialist

## 2019-11-12 DIAGNOSIS — I480 Paroxysmal atrial fibrillation: Secondary | ICD-10-CM

## 2019-11-12 DIAGNOSIS — I4811 Longstanding persistent atrial fibrillation: Secondary | ICD-10-CM

## 2019-11-12 DIAGNOSIS — I059 Rheumatic mitral valve disease, unspecified: Secondary | ICD-10-CM | POA: Diagnosis not present

## 2019-11-12 DIAGNOSIS — Z952 Presence of prosthetic heart valve: Secondary | ICD-10-CM | POA: Diagnosis not present

## 2019-11-12 LAB — POCT INR: INR: 2.7 (ref 2.0–3.0)

## 2019-11-12 NOTE — Patient Instructions (Signed)
Continue with 1 tablet every day.  Recheck in 6 weeks.   

## 2019-11-28 DIAGNOSIS — I10 Essential (primary) hypertension: Secondary | ICD-10-CM | POA: Diagnosis not present

## 2019-11-28 DIAGNOSIS — N1831 Chronic kidney disease, stage 3a: Secondary | ICD-10-CM | POA: Diagnosis not present

## 2019-11-28 DIAGNOSIS — Z0000 Encounter for general adult medical examination without abnormal findings: Secondary | ICD-10-CM | POA: Diagnosis not present

## 2019-11-28 DIAGNOSIS — Z1389 Encounter for screening for other disorder: Secondary | ICD-10-CM | POA: Diagnosis not present

## 2019-11-28 DIAGNOSIS — Z23 Encounter for immunization: Secondary | ICD-10-CM | POA: Diagnosis not present

## 2019-11-28 DIAGNOSIS — I480 Paroxysmal atrial fibrillation: Secondary | ICD-10-CM | POA: Diagnosis not present

## 2019-11-28 DIAGNOSIS — I428 Other cardiomyopathies: Secondary | ICD-10-CM | POA: Diagnosis not present

## 2019-11-28 DIAGNOSIS — D72819 Decreased white blood cell count, unspecified: Secondary | ICD-10-CM | POA: Diagnosis not present

## 2019-12-24 ENCOUNTER — Ambulatory Visit (INDEPENDENT_AMBULATORY_CARE_PROVIDER_SITE_OTHER): Payer: Medicare Other | Admitting: Pharmacist

## 2019-12-24 ENCOUNTER — Other Ambulatory Visit: Payer: Self-pay

## 2019-12-24 DIAGNOSIS — I059 Rheumatic mitral valve disease, unspecified: Secondary | ICD-10-CM

## 2019-12-24 DIAGNOSIS — I480 Paroxysmal atrial fibrillation: Secondary | ICD-10-CM | POA: Diagnosis not present

## 2019-12-24 DIAGNOSIS — I4811 Longstanding persistent atrial fibrillation: Secondary | ICD-10-CM | POA: Diagnosis not present

## 2019-12-24 DIAGNOSIS — Z952 Presence of prosthetic heart valve: Secondary | ICD-10-CM

## 2019-12-24 LAB — POCT INR: INR: 3.6 — AB (ref 2.0–3.0)

## 2020-01-28 DIAGNOSIS — Z23 Encounter for immunization: Secondary | ICD-10-CM | POA: Diagnosis not present

## 2020-01-30 ENCOUNTER — Ambulatory Visit (INDEPENDENT_AMBULATORY_CARE_PROVIDER_SITE_OTHER): Payer: Medicare Other

## 2020-01-30 ENCOUNTER — Other Ambulatory Visit: Payer: Self-pay

## 2020-01-30 DIAGNOSIS — I059 Rheumatic mitral valve disease, unspecified: Secondary | ICD-10-CM

## 2020-01-30 DIAGNOSIS — Z952 Presence of prosthetic heart valve: Secondary | ICD-10-CM | POA: Diagnosis not present

## 2020-01-30 DIAGNOSIS — I4811 Longstanding persistent atrial fibrillation: Secondary | ICD-10-CM

## 2020-01-30 DIAGNOSIS — Z5181 Encounter for therapeutic drug level monitoring: Secondary | ICD-10-CM

## 2020-01-30 DIAGNOSIS — I480 Paroxysmal atrial fibrillation: Secondary | ICD-10-CM

## 2020-01-30 LAB — POCT INR: INR: 3.5 — AB (ref 2.0–3.0)

## 2020-01-30 NOTE — Patient Instructions (Signed)
Continue with 1 tablet every day.  Recheck in 6 weeks.   

## 2020-02-04 DIAGNOSIS — Z961 Presence of intraocular lens: Secondary | ICD-10-CM | POA: Diagnosis not present

## 2020-02-04 DIAGNOSIS — H353131 Nonexudative age-related macular degeneration, bilateral, early dry stage: Secondary | ICD-10-CM | POA: Diagnosis not present

## 2020-02-04 DIAGNOSIS — D3141 Benign neoplasm of right ciliary body: Secondary | ICD-10-CM | POA: Diagnosis not present

## 2020-02-04 DIAGNOSIS — H04123 Dry eye syndrome of bilateral lacrimal glands: Secondary | ICD-10-CM | POA: Diagnosis not present

## 2020-02-05 DIAGNOSIS — Z96642 Presence of left artificial hip joint: Secondary | ICD-10-CM | POA: Diagnosis not present

## 2020-02-05 DIAGNOSIS — Z471 Aftercare following joint replacement surgery: Secondary | ICD-10-CM | POA: Diagnosis not present

## 2020-02-10 ENCOUNTER — Other Ambulatory Visit: Payer: Self-pay | Admitting: Cardiology

## 2020-02-11 ENCOUNTER — Ambulatory Visit (INDEPENDENT_AMBULATORY_CARE_PROVIDER_SITE_OTHER): Payer: Medicare Other | Admitting: Cardiology

## 2020-02-11 ENCOUNTER — Other Ambulatory Visit: Payer: Self-pay

## 2020-02-11 ENCOUNTER — Encounter: Payer: Self-pay | Admitting: Cardiology

## 2020-02-11 VITALS — BP 130/70 | HR 85 | Ht 72.0 in | Wt 163.0 lb

## 2020-02-11 DIAGNOSIS — E782 Mixed hyperlipidemia: Secondary | ICD-10-CM | POA: Diagnosis not present

## 2020-02-11 DIAGNOSIS — I10 Essential (primary) hypertension: Secondary | ICD-10-CM

## 2020-02-11 DIAGNOSIS — I351 Nonrheumatic aortic (valve) insufficiency: Secondary | ICD-10-CM | POA: Diagnosis not present

## 2020-02-11 DIAGNOSIS — I872 Venous insufficiency (chronic) (peripheral): Secondary | ICD-10-CM | POA: Diagnosis not present

## 2020-02-11 DIAGNOSIS — I4811 Longstanding persistent atrial fibrillation: Secondary | ICD-10-CM | POA: Diagnosis not present

## 2020-02-11 DIAGNOSIS — I428 Other cardiomyopathies: Secondary | ICD-10-CM

## 2020-02-11 DIAGNOSIS — Z952 Presence of prosthetic heart valve: Secondary | ICD-10-CM | POA: Diagnosis not present

## 2020-02-11 MED ORDER — BISOPROLOL FUMARATE 5 MG PO TABS: 5.0000 mg | ORAL_TABLET | Freq: Every day | ORAL | 3 refills | Status: DC

## 2020-02-11 NOTE — Progress Notes (Signed)
Primary Care Provider: [name], MD Cardiologist: No primary care provider on file. Electrophysiologist: None  Clinic Note: No chief complaint on file.  HPI:    Richard Spears is a 85 y.o. male with a longstanding permanent A. Fib & s/p Mechanical MVR who presents today for ~ annual f/u.Marland Kitchen  Previously saw Dr. Ilda Foil, then Dr. Lendell Caprice. (changed to Dr. Ellyn Hack b/c his wife is also a patient)  A. fib. Rate controlled on Zebeta, warfarin for anticoagulation. CHADS2VASC=2 (age x 2)  Status post Mechanical Mitral Valve Replacement. 1997 - on Warfarin  Venous deficiency, wears support stockings.   Mild orthostatic dizziness.    Moderate AI stable from 2012/ - 2017  Richard Spears was last seen in Sept 2020 to follow-up his echocardiogram done in July 2020.  He said he was having his ups and downs with blood pressures, but happy with the fact that he was not having morning headaches and blurred vision.  Was pretty much back to full functioning.  Recovered from hip surgery.  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  No cardiac symptoms.  No sensation of A. fib.Marland Kitchen  Recent Hospitalizations: None  Reviewed  CV studies:    The following studies were reviewed today: (if available, images/films reviewed: From Epic Chart or Care Everywhere)  None:  Interval History:   Richard Spears returns today overall doing pretty well.  His hip is still bothering him some but he is able to walk about three quarters of a mile a day.  Does not like to go out in the heat so sometimes he walks inside.  He does this sometimes by going just different stores.  He said about couple weeks ago he fell and hit his right knee.  Bruised pretty good, and is still sore.  He has had some swelling now on the right greater than left leg for little bit, but no real PND orthopnea.  Doing relatively well with warfarin.  No major bleeds.  He had a couple days where his INR was  up to 3.6 but no bleeds.  Says he occasionally feels palpitations but usually is if he is doing the more exertional, and sometimes when he decides to lie down to go to sleep when he still thinking about things.  No lightheadedness or dizziness.  CV Review of Symptoms (Summary) Cardiovascular ROS: no chest pain or dyspnea on exertion positive for - edema and palpitations negative for - chest pain, dyspnea on exertion, orthopnea, paroxysmal nocturnal dyspnea, rapid heart rate, shortness of breath or Lightheadedness or dizziness, syncope/near syncope, TIA/amaurosis fugax.  Claudication   The patient does not have symptoms concerning for COVID-19 infection (fever, chills, cough, or new shortness of breath).  The patient is practicing social distancing & Masking.   COVID-19 vaccines in January  REVIEWED OF SYSTEMS   Review of Systems  Constitutional: Negative for malaise/fatigue and weight loss.  HENT: Negative for congestion and nosebleeds.   Respiratory: Negative for cough and shortness of breath.   Gastrointestinal: Negative for abdominal pain, blood in stool and melena.  Musculoskeletal: Positive for falls (Lost his balance and fell, hit his right knee.  Has bruising on the right knee and swelling).  Neurological: Negative for dizziness, focal weakness, weakness and headaches.  Psychiatric/Behavioral: Positive for memory loss (But still pretty clear). Negative for depression. The patient is not nervous/anxious and does not have insomnia.    I have reviewed and (if needed) personally updated the patient's  problem list, medications, allergies, past medical and surgical history, social and family history.   PAST MEDICAL HISTORY   Past Medical History:  Diagnosis Date   Anemia    leakoppenia   BPH (benign prostatic hypertrophy)    Bullous pemphigoid    [name], March 2011, right forearm squamous cell carcinoma   Chronic anticoagulation    systemic   Colon polyp    transverse,  2002   History of peptic ulcer    remote, 3/95   Hx of actinic keratosis    Hx of basal cell carcinoma    Hx of squamous cell carcinoma of skin    Hyperlipidemia    Left inguinal hernia    Moderate aortic insufficiency 2009   audible aortic insufficiency on 1/09 echo   PAF (paroxysmal atrial fibrillation) ([place]) 01/17/2014   On Warfarin.   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 Jude mechanical valve   TOTAL HIP ARTHROPLASTY Right 10/12/2017   Procedure: RIGHT TOTAL HIP ARTHROPLASTY ANTERIOR APPROACH;  Surgeon: Gaynelle Arabian, MD;  Location: [facility];  Service: Orthopedics;  Laterality: Right;   TRANSTHORACIC ECHOCARDIOGRAM  12/2018   Unable to assess diastolic function because of A. fib. Normal RV function, but moderately elevated RVSP.  Severe biatrial enlargement. S/P St Jude bileaflet mechanical MVR that appears to be functioning normally. Mitral valve regurgitation cannot assess due to mechanical valve shadowing. MV Mean grad: 7.0 mmHg MV Area (PHT): 3.38 cm (stable for valve).  Mild Ao Sclerosis, Mild-Mod AI   TRANSTHORACIC ECHOCARDIOGRAM  08/'17; 10/'18    a) Mild conc LVH. EF 55-60%. No RWMA. Mod AI. Mechanical MV prosthesis functioning properly. LAD dilation.;; b)  EF 55-60%.  Mo AI.  Bileaflet Saint Jude mechanical MV with no paravalvular leak.  Severe LA dilation.  Minimally elevated PAP    MEDICATIONS/ALLERGIES   Current Meds  Medication Sig   furosemide (LASIX) 40 MG tablet TAKE 1 TABLET DAILY AND TAKE AN ADDITIONAL ONE-HALF TABLET THREE DAYS A WEEK     hydrALAZINE (APRESOLINE) 25 MG tablet Take 1 tablet (25 mg total) by mouth daily with lunch. Take extra tablet as needed for systolic BP greater than 016 mmHg.   isosorbide mononitrate (IMDUR) 30 MG 24 hr tablet Take 1 tablet (30 mg total) by mouth daily.   isosorbide mononitrate (IMDUR) 30 MG 24 hr tablet Take 1 tablet (30 mg total) by mouth daily.   LORazepam (ATIVAN) 1 MG tablet Take 1 tablet (1 mg total) by mouth every 8 (eight) hours as needed for anxiety.   Multiple Vitamins-Minerals (MULTIVITAMIN PO) Take 1 tablet by mouth daily.   Multiple Vitamins-Minerals (PRESERVISION AREDS 2 PO) Take 1 capsule by mouth daily.   warfarin (COUMADIN) 5 MG tablet Take 5 mg by mouth as directed.    warfarin (COUMADIN) 5 MG tablet TAKE ONE TO ONE AND ONE-HALF TABLETS DAILY AS DIRECTED   [DISCONTINUED] bisoprolol (ZEBETA) 5 MG tablet TAKE 1 TABLET DAILY    Allergies  Allergen Reactions   Flexeril [Cyclobenzaprine] Diarrhea  SOCIAL HISTORY/FAMILY HISTORY   Reviewed in Epic:  Pertinent findings: He is (written usual, because his wife is dealing with shingles.  He has had to be pretty much full caregiver.  OBJCTIVE -PE, EKG, labs   Wt Readings from Last 3 Encounters:  02/11/20 163 lb (73.9 kg)  03/16/19 156 lb 9.6 oz (71 kg)  01/18/18 156 lb 3.2 oz (70.9 kg)    Physical Exam: BP 130/70    Pulse 85    Ht 6' (1.829 m)    Wt 163 lb (73.9 kg)    BMI 22.11 kg/m  Physical Exam Constitutional:      General: He is not in acute distress.    Appearance: Normal appearance. He is normal weight. He is not ill-appearing.  HENT:     Head: Normocephalic and atraumatic.  Neck:     Vascular: No carotid bruit.     Comments: No JVD Cardiovascular:     Rate and Rhythm: Normal rate. Rhythm irregularly irregular.     Chest Wall: PMI is not displaced.     Pulses: Decreased pulses (Mildly decreased pedal pulses because of mild edema.).     Heart sounds: Murmur heard. High-pitched harsh  crescendo-decrescendo early systolic murmur is present with a grade of 1/6 at the upper right sternal border radiating to the neck.  No gallop.      Comments: Metallic S1 with borderline S2.  1/6 low pitched holodiastolic rumbling murmur at lower sternal border. Pulmonary:     Effort: Pulmonary effort is normal. No respiratory distress.     Breath sounds: Normal breath sounds.  Chest:     Chest wall: No tenderness.  Abdominal:     General: Abdomen is flat. Bowel sounds are normal.     Palpations: Abdomen is soft. There is no mass.  Musculoskeletal:        General: Swelling (Borderline bilateral) present. Normal range of motion.     Cervical back: Normal range of motion and neck supple.  Neurological:     General: No focal deficit present.     Mental Status: He is alert and oriented to person, place, and time.  Psychiatric:        Mood and Affect: Mood normal.        Behavior: Behavior normal.        Thought Content: Thought content normal.        Judgment: Judgment normal.     Adult ECG Report  Rate: 85 ;  Rhythm: atrial fibrillation and Otherwise normal axis, intervals and durations.;   Narrative Interpretation: Stable  Recent Labs:  No results found for: CHOL, HDL, LDLCALC, LDLDIRECT, TRIG, CHOLHDL Lab Results  Component Value Date   CREATININE 1.38 10/20/2017   BUN 25 (A) 10/20/2017   NA 130 10/20/2017   K 4.2 10/20/2017   CL 103 10/14/2017   CO2 21 (L) 10/14/2017   No results found for: TSH  ASSESSMENT/PLAN    Problem List Items Addressed This Visit    S/P MVR (mitral valve replacement) (Chronic)    Stable mechanical valve.  Is on warfarin for valve and A. fib. We can discuss if he would want to be check an echocardiogram next year.  But probably if no symptoms, would avoid further studies..      Longstanding persistent atrial fibrillation: CHA2DS2-VASc Score 3 - Primary (Chronic)    Stable rate controlled A. fib.  Essentially asymptomatic.  Remains on Zebeta  for rate control and warfarin for anticoagulation with mechanical valve.  Relevant Medications   bisoprolol (ZEBETA) 5 MG tablet   Other Relevant Orders   EKG 12-Lead (Completed)   AI (aortic insufficiency) (Chronic)    Mild to moderate on echo.  No longer following up on the symptoms warrant.      Relevant Medications   bisoprolol (ZEBETA) 5 MG tablet   Venous insufficiency (Chronic)    Stable.  Wears support stockings and takes daily dose of Lasix with as needed dosing.  Foot elevation      Relevant Medications   bisoprolol (ZEBETA) 5 MG tablet   Hyperlipidemia (Chronic)    Not currently on meds.  Labs are followed by PCP.  Defer to PCP.      Relevant Medications   bisoprolol (ZEBETA) 5 MG tablet   Essential hypertension (Chronic)    I would prefer to have more ups and downs for his blood pressure.  He takes bisoprolol and Imdur along with once daily hydralazine with additional doses as needed.      Relevant Medications   bisoprolol (ZEBETA) 5 MG tablet   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HCC)    Normal EF with diastolic dysfunction now.  No real diastolic heart failure symptoms. Continue bisoprolol with ocular reduction from her hydralazine. Is on standing dose of Lasix with as needed dosing that is not necessarily using.  No longer on ACE inhibitor or ARB.  Is on low-dose hydralazine/Imdur.      Relevant Medications   bisoprolol (ZEBETA) 5 MG tablet       COVID-19 Education: The signs and symptoms of COVID-19 were discussed with the patient and how to seek care for testing (follow up with PCP or arrange E-visit).   The importance of social distancing and COVID-19 vaccination was discussed today.  I spent a total of 18 minutes with the patient spent in direct patient consultation.  Additional time spent with chart review  / charting (studies, outside notes, etc): 8 Total Time: 26 min   Current medicines are reviewed at length with the patient today.  (+/- concerns)  n/a  Notice: This dictation was prepared with Dragon dictation along with smaller phrase technology. Any transcriptional errors that result from this process are unintentional and may not be corrected upon review.  Patient Instructions / Medication Changes & Studies & Tests Ordered   Patient Instructions  Medication Instructions:  The current medical regimen is effective;  continue present plan and medications.  *If you need a refill on your cardiac medications before your next appointment, please call your pharmacy*   Follow-Up: At [facility], you and your health needs are our priority.  As part of our continuing mission to provide you with exceptional heart care, we have created designated Provider Care Teams.  These Care Teams include your primary Cardiologist (physician) and Advanced Practice Providers (APPs -  Physician Assistants and Nurse Practitioners) who all work together to provide you with the care you need, when you need it.  We recommend signing up for the patient portal called "MyChart".  Sign up information is provided on this After Visit Summary.  MyChart is used to connect with patients for Virtual Visits (Telemedicine).  Patients are able to view lab/test results, encounter notes, upcoming appointments, etc.  Non-urgent messages can be sent to your provider as well.   To learn more about what you can do with MyChart, go to [email or web address].    Your next appointment:   12 month(s)  The format for your next appointment:   In Person  Provider:  Glenetta Hew, MD   Studies Ordered:   Orders Placed This Encounter  Procedures   EKG 12-Lead     Glenetta Hew, M.D., M.S. Interventional Cardiologist   Pager # [phone] Phone # [phone] [address] [place], [place] [address]   Thank you for choosing Heartcare at [facility]!!

## 2020-02-11 NOTE — Patient Instructions (Addendum)
Medication Instructions:  The current medical regimen is effective;  continue present plan and medications.  *If you need a refill on your cardiac medications before your next appointment, please call your pharmacy*   Follow-Up: At [facility], you and your health needs are our priority.  As part of our continuing mission to provide you with exceptional heart care, we have created designated Provider Care Teams.  These Care Teams include your primary Cardiologist (physician) and Advanced Practice Providers (APPs -  Physician Assistants and Nurse Practitioners) who all work together to provide you with the care you need, when you need it.  We recommend signing up for the patient portal called "MyChart".  Sign up information is provided on this After Visit Summary.  MyChart is used to connect with patients for Virtual Visits (Telemedicine).  Patients are able to view lab/test results, encounter notes, upcoming appointments, etc.  Non-urgent messages can be sent to your provider as well.   To learn more about what you can do with MyChart, go to [email or web address].    Your next appointment:   12 month(s)  The format for your next appointment:   In Person  Provider:   Glenetta Hew, MD

## 2020-02-20 NOTE — Assessment & Plan Note (Signed)
I would prefer to have more ups and downs for his blood pressure.  He takes bisoprolol and Imdur along with once daily hydralazine with additional doses as needed.

## 2020-02-20 NOTE — Assessment & Plan Note (Signed)
Stable.  Wears support stockings and takes daily dose of Lasix with as needed dosing.  Foot elevation

## 2020-02-20 NOTE — Assessment & Plan Note (Signed)
Mild to moderate on echo.  No longer following up on the symptoms warrant.

## 2020-02-20 NOTE — Assessment & Plan Note (Signed)
Normal EF with diastolic dysfunction now.  No real diastolic heart failure symptoms. Continue bisoprolol with ocular reduction from her hydralazine. Is on standing dose of Lasix with as needed dosing that is not necessarily using.  No longer on ACE inhibitor or ARB.  Is on low-dose hydralazine/Imdur.

## 2020-02-20 NOTE — Assessment & Plan Note (Signed)
Stable rate controlled A. fib.  Essentially asymptomatic.  Remains on Zebeta for rate control and warfarin for anticoagulation with mechanical valve.

## 2020-02-20 NOTE — Assessment & Plan Note (Signed)
Stable mechanical valve.  Is on warfarin for valve and A. fib. We can discuss if he would want to be check an echocardiogram next year.  But probably if no symptoms, would avoid further studies.Marland Kitchen

## 2020-02-20 NOTE — Assessment & Plan Note (Signed)
Not currently on meds.  Labs are followed by PCP.  Defer to PCP.

## 2020-03-12 ENCOUNTER — Other Ambulatory Visit: Payer: Self-pay

## 2020-03-12 ENCOUNTER — Ambulatory Visit (INDEPENDENT_AMBULATORY_CARE_PROVIDER_SITE_OTHER): Payer: Medicare Other

## 2020-03-12 DIAGNOSIS — I059 Rheumatic mitral valve disease, unspecified: Secondary | ICD-10-CM

## 2020-03-12 DIAGNOSIS — I4811 Longstanding persistent atrial fibrillation: Secondary | ICD-10-CM | POA: Diagnosis not present

## 2020-03-12 DIAGNOSIS — Z5181 Encounter for therapeutic drug level monitoring: Secondary | ICD-10-CM

## 2020-03-12 DIAGNOSIS — I480 Paroxysmal atrial fibrillation: Secondary | ICD-10-CM

## 2020-03-12 DIAGNOSIS — Z952 Presence of prosthetic heart valve: Secondary | ICD-10-CM | POA: Diagnosis not present

## 2020-03-12 LAB — POCT INR: INR: 3.7 — AB (ref 2.0–3.0)

## 2020-03-12 NOTE — Patient Instructions (Signed)
Take 0.5 tablet today and then Continue with 1 tablet every day.  Recheck in 6 weeks.

## 2020-03-22 ENCOUNTER — Other Ambulatory Visit: Payer: Self-pay | Admitting: Cardiology

## 2020-04-08 DIAGNOSIS — Z23 Encounter for immunization: Secondary | ICD-10-CM | POA: Diagnosis not present

## 2020-04-25 ENCOUNTER — Ambulatory Visit (INDEPENDENT_AMBULATORY_CARE_PROVIDER_SITE_OTHER): Payer: Medicare Other | Admitting: Pharmacist Clinician (PhC)/ Clinical Pharmacy Specialist

## 2020-04-25 ENCOUNTER — Other Ambulatory Visit: Payer: Self-pay

## 2020-04-25 DIAGNOSIS — I4811 Longstanding persistent atrial fibrillation: Secondary | ICD-10-CM | POA: Diagnosis not present

## 2020-04-25 DIAGNOSIS — Z952 Presence of prosthetic heart valve: Secondary | ICD-10-CM | POA: Diagnosis not present

## 2020-04-25 DIAGNOSIS — I059 Rheumatic mitral valve disease, unspecified: Secondary | ICD-10-CM

## 2020-04-25 DIAGNOSIS — I480 Paroxysmal atrial fibrillation: Secondary | ICD-10-CM | POA: Diagnosis not present

## 2020-04-25 LAB — POCT INR: INR: 2.5 (ref 2.0–3.0)

## 2020-05-04 ENCOUNTER — Other Ambulatory Visit: Payer: Self-pay | Admitting: Cardiology

## 2020-05-12 DIAGNOSIS — Z23 Encounter for immunization: Secondary | ICD-10-CM | POA: Diagnosis not present

## 2020-06-04 ENCOUNTER — Ambulatory Visit (INDEPENDENT_AMBULATORY_CARE_PROVIDER_SITE_OTHER): Payer: Medicare Other

## 2020-06-04 ENCOUNTER — Other Ambulatory Visit: Payer: Self-pay

## 2020-06-04 DIAGNOSIS — I059 Rheumatic mitral valve disease, unspecified: Secondary | ICD-10-CM

## 2020-06-04 DIAGNOSIS — Z5181 Encounter for therapeutic drug level monitoring: Secondary | ICD-10-CM

## 2020-06-04 DIAGNOSIS — I4811 Longstanding persistent atrial fibrillation: Secondary | ICD-10-CM | POA: Diagnosis not present

## 2020-06-04 DIAGNOSIS — I480 Paroxysmal atrial fibrillation: Secondary | ICD-10-CM

## 2020-06-04 DIAGNOSIS — Z952 Presence of prosthetic heart valve: Secondary | ICD-10-CM

## 2020-06-04 LAB — POCT INR: INR: 2.5 (ref 2.0–3.0)

## 2020-06-04 NOTE — Patient Instructions (Signed)
Continue with 1 tablet every day.  Recheck in 6 weeks.

## 2020-06-11 DIAGNOSIS — C44321 Squamous cell carcinoma of skin of nose: Secondary | ICD-10-CM | POA: Diagnosis not present

## 2020-06-11 DIAGNOSIS — C44629 Squamous cell carcinoma of skin of left upper limb, including shoulder: Secondary | ICD-10-CM | POA: Diagnosis not present

## 2020-06-11 DIAGNOSIS — Z85828 Personal history of other malignant neoplasm of skin: Secondary | ICD-10-CM | POA: Diagnosis not present

## 2020-06-11 DIAGNOSIS — D485 Neoplasm of uncertain behavior of skin: Secondary | ICD-10-CM | POA: Diagnosis not present

## 2020-07-16 ENCOUNTER — Ambulatory Visit (INDEPENDENT_AMBULATORY_CARE_PROVIDER_SITE_OTHER): Payer: Medicare Other

## 2020-07-16 ENCOUNTER — Other Ambulatory Visit: Payer: Self-pay

## 2020-07-16 DIAGNOSIS — Z952 Presence of prosthetic heart valve: Secondary | ICD-10-CM | POA: Diagnosis not present

## 2020-07-16 DIAGNOSIS — I059 Rheumatic mitral valve disease, unspecified: Secondary | ICD-10-CM

## 2020-07-16 DIAGNOSIS — Z5181 Encounter for therapeutic drug level monitoring: Secondary | ICD-10-CM

## 2020-07-16 DIAGNOSIS — I480 Paroxysmal atrial fibrillation: Secondary | ICD-10-CM | POA: Diagnosis not present

## 2020-07-16 DIAGNOSIS — I4811 Longstanding persistent atrial fibrillation: Secondary | ICD-10-CM

## 2020-07-16 LAB — POCT INR: INR: 2.6 (ref 2.0–3.0)

## 2020-07-16 NOTE — Patient Instructions (Signed)
Continue with 1 tablet every day.  Recheck in 6 weeks.   

## 2020-07-22 DIAGNOSIS — C44729 Squamous cell carcinoma of skin of left lower limb, including hip: Secondary | ICD-10-CM | POA: Diagnosis not present

## 2020-07-22 DIAGNOSIS — Z85828 Personal history of other malignant neoplasm of skin: Secondary | ICD-10-CM | POA: Diagnosis not present

## 2020-08-12 DIAGNOSIS — L738 Other specified follicular disorders: Secondary | ICD-10-CM | POA: Diagnosis not present

## 2020-08-12 DIAGNOSIS — Z85828 Personal history of other malignant neoplasm of skin: Secondary | ICD-10-CM | POA: Diagnosis not present

## 2020-08-25 ENCOUNTER — Other Ambulatory Visit: Payer: Self-pay

## 2020-08-25 ENCOUNTER — Encounter (HOSPITAL_COMMUNITY): Payer: Self-pay | Admitting: *Deleted

## 2020-08-25 ENCOUNTER — Inpatient Hospital Stay (HOSPITAL_COMMUNITY)
Admission: EM | Admit: 2020-08-25 | Discharge: 2020-09-02 | DRG: 481 | Disposition: A | Discharge status: Skilled Nursing Facility | Payer: Medicare Other | Attending: Internal Medicine | Admitting: Internal Medicine

## 2020-08-25 ENCOUNTER — Emergency Department (HOSPITAL_COMMUNITY): Payer: Medicare Other

## 2020-08-25 DIAGNOSIS — Z952 Presence of prosthetic heart valve: Secondary | ICD-10-CM | POA: Diagnosis not present

## 2020-08-25 DIAGNOSIS — W010XXA Fall on same level from slipping, tripping and stumbling without subsequent striking against object, initial encounter: Secondary | ICD-10-CM | POA: Diagnosis present

## 2020-08-25 DIAGNOSIS — M9702XA Periprosthetic fracture around internal prosthetic left hip joint, initial encounter: Secondary | ICD-10-CM | POA: Diagnosis not present

## 2020-08-25 DIAGNOSIS — D539 Nutritional anemia, unspecified: Secondary | ICD-10-CM | POA: Diagnosis present

## 2020-08-25 DIAGNOSIS — S72442A Displaced fracture of lower epiphysis (separation) of left femur, initial encounter for closed fracture: Secondary | ICD-10-CM | POA: Diagnosis not present

## 2020-08-25 DIAGNOSIS — W19XXXA Unspecified fall, initial encounter: Secondary | ICD-10-CM | POA: Diagnosis not present

## 2020-08-25 DIAGNOSIS — D509 Iron deficiency anemia, unspecified: Secondary | ICD-10-CM | POA: Diagnosis present

## 2020-08-25 DIAGNOSIS — R0902 Hypoxemia: Secondary | ICD-10-CM | POA: Diagnosis not present

## 2020-08-25 DIAGNOSIS — I4811 Longstanding persistent atrial fibrillation: Secondary | ICD-10-CM | POA: Diagnosis present

## 2020-08-25 DIAGNOSIS — N179 Acute kidney failure, unspecified: Secondary | ICD-10-CM

## 2020-08-25 DIAGNOSIS — F1090 Alcohol use, unspecified, uncomplicated: Secondary | ICD-10-CM

## 2020-08-25 DIAGNOSIS — R000 Tachycardia, unspecified: Secondary | ICD-10-CM | POA: Diagnosis not present

## 2020-08-25 DIAGNOSIS — I10 Essential (primary) hypertension: Secondary | ICD-10-CM | POA: Diagnosis present

## 2020-08-25 DIAGNOSIS — S72332A Displaced oblique fracture of shaft of left femur, initial encounter for closed fracture: Secondary | ICD-10-CM | POA: Diagnosis not present

## 2020-08-25 DIAGNOSIS — Z789 Other specified health status: Secondary | ICD-10-CM

## 2020-08-25 DIAGNOSIS — Z20822 Contact with and (suspected) exposure to covid-19: Secondary | ICD-10-CM | POA: Diagnosis present

## 2020-08-25 DIAGNOSIS — Z96643 Presence of artificial hip joint, bilateral: Secondary | ICD-10-CM | POA: Diagnosis not present

## 2020-08-25 DIAGNOSIS — Z7289 Other problems related to lifestyle: Secondary | ICD-10-CM

## 2020-08-25 DIAGNOSIS — Z043 Encounter for examination and observation following other accident: Secondary | ICD-10-CM | POA: Diagnosis not present

## 2020-08-25 DIAGNOSIS — Z419 Encounter for procedure for purposes other than remedying health state, unspecified: Secondary | ICD-10-CM

## 2020-08-25 DIAGNOSIS — Z86010 Personal history of colonic polyps: Secondary | ICD-10-CM

## 2020-08-25 DIAGNOSIS — Y9301 Activity, walking, marching and hiking: Secondary | ICD-10-CM | POA: Diagnosis present

## 2020-08-25 DIAGNOSIS — I499 Cardiac arrhythmia, unspecified: Secondary | ICD-10-CM | POA: Diagnosis not present

## 2020-08-25 DIAGNOSIS — Z8711 Personal history of peptic ulcer disease: Secondary | ICD-10-CM

## 2020-08-25 DIAGNOSIS — S72342A Displaced spiral fracture of shaft of left femur, initial encounter for closed fracture: Secondary | ICD-10-CM | POA: Diagnosis not present

## 2020-08-25 DIAGNOSIS — M25552 Pain in left hip: Secondary | ICD-10-CM | POA: Diagnosis not present

## 2020-08-25 DIAGNOSIS — Y92009 Unspecified place in unspecified non-institutional (private) residence as the place of occurrence of the external cause: Secondary | ICD-10-CM

## 2020-08-25 DIAGNOSIS — Z86007 Personal history of in-situ neoplasm of skin: Secondary | ICD-10-CM

## 2020-08-25 DIAGNOSIS — N400 Enlarged prostate without lower urinary tract symptoms: Secondary | ICD-10-CM | POA: Diagnosis present

## 2020-08-25 DIAGNOSIS — K5900 Constipation, unspecified: Secondary | ICD-10-CM

## 2020-08-25 DIAGNOSIS — Z87891 Personal history of nicotine dependence: Secondary | ICD-10-CM

## 2020-08-25 DIAGNOSIS — Z09 Encounter for follow-up examination after completed treatment for conditions other than malignant neoplasm: Secondary | ICD-10-CM

## 2020-08-25 DIAGNOSIS — Z48810 Encounter for surgical aftercare following surgery on the sense organs: Secondary | ICD-10-CM | POA: Diagnosis not present

## 2020-08-25 DIAGNOSIS — S79912A Unspecified injury of left hip, initial encounter: Secondary | ICD-10-CM | POA: Diagnosis not present

## 2020-08-25 DIAGNOSIS — K5901 Slow transit constipation: Secondary | ICD-10-CM

## 2020-08-25 DIAGNOSIS — E785 Hyperlipidemia, unspecified: Secondary | ICD-10-CM | POA: Diagnosis present

## 2020-08-25 DIAGNOSIS — D649 Anemia, unspecified: Secondary | ICD-10-CM

## 2020-08-25 LAB — RETICULOCYTES
Immature Retic Fract: 26 % — ABNORMAL HIGH (ref 2.3–15.9)
RBC.: 2.66 MIL/uL — ABNORMAL LOW (ref 4.22–5.81)
Retic Count, Absolute: 74.2 10*3/uL (ref 19.0–186.0)
Retic Ct Pct: 2.8 % (ref 0.4–3.1)

## 2020-08-25 LAB — CBC WITH DIFFERENTIAL/PLATELET
Abs Immature Granulocytes: 0.03 10*3/uL (ref 0.00–0.07)
Basophils Absolute: 0 10*3/uL (ref 0.0–0.1)
Basophils Relative: 0 %
Eosinophils Absolute: 0 10*3/uL (ref 0.0–0.5)
Eosinophils Relative: 1 %
HCT: 27.7 % — ABNORMAL LOW (ref 39.0–52.0)
Hemoglobin: 8.8 g/dL — ABNORMAL LOW (ref 13.0–17.0)
Immature Granulocytes: 1 %
Lymphocytes Relative: 5 %
Lymphs Abs: 0.3 10*3/uL — ABNORMAL LOW (ref 0.7–4.0)
MCH: 33.1 pg (ref 26.0–34.0)
MCHC: 31.8 g/dL (ref 30.0–36.0)
MCV: 104.1 fL — ABNORMAL HIGH (ref 80.0–100.0)
Monocytes Absolute: 0.4 10*3/uL (ref 0.1–1.0)
Monocytes Relative: 7 %
Neutro Abs: 4.4 10*3/uL (ref 1.7–7.7)
Neutrophils Relative %: 86 %
Platelets: 136 10*3/uL — ABNORMAL LOW (ref 150–400)
RBC: 2.66 MIL/uL — ABNORMAL LOW (ref 4.22–5.81)
RDW: 15.7 % — ABNORMAL HIGH (ref 11.5–15.5)
WBC: 5.1 10*3/uL (ref 4.0–10.5)
nRBC: 0 % (ref 0.0–0.2)

## 2020-08-25 LAB — RESP PANEL BY RT-PCR (FLU A&B, COVID) ARPGX2
Influenza A by PCR: NEGATIVE
Influenza B by PCR: NEGATIVE
SARS Coronavirus 2 by RT PCR: NEGATIVE

## 2020-08-25 LAB — HEPATIC FUNCTION PANEL
ALT: 24 U/L (ref 0–44)
AST: 39 U/L (ref 15–41)
Albumin: 3.4 g/dL — ABNORMAL LOW (ref 3.5–5.0)
Alkaline Phosphatase: 54 U/L (ref 38–126)
Bilirubin, Direct: 0.2 mg/dL (ref 0.0–0.2)
Indirect Bilirubin: 0.9 mg/dL (ref 0.3–0.9)
Total Bilirubin: 1.1 mg/dL (ref 0.3–1.2)
Total Protein: 5.8 g/dL — ABNORMAL LOW (ref 6.5–8.1)

## 2020-08-25 LAB — VITAMIN B12: Vitamin B-12: 478 pg/mL (ref 180–914)

## 2020-08-25 LAB — BASIC METABOLIC PANEL
Anion gap: 10 (ref 5–15)
BUN: 32 mg/dL — ABNORMAL HIGH (ref 8–23)
CO2: 24 mmol/L (ref 22–32)
Calcium: 8.6 mg/dL — ABNORMAL LOW (ref 8.9–10.3)
Chloride: 101 mmol/L (ref 98–111)
Creatinine, Ser: 1.53 mg/dL — ABNORMAL HIGH (ref 0.61–1.24)
GFR, Estimated: 42 mL/min — ABNORMAL LOW (ref >60)
Glucose, Bld: 127 mg/dL — ABNORMAL HIGH (ref 70–99)
Potassium: 4.2 mmol/L (ref 3.5–5.1)
Sodium: 135 mmol/L (ref 135–145)

## 2020-08-25 LAB — IRON AND TIBC
Iron: 60 ug/dL (ref 45–182)
Saturation Ratios: 15 % — ABNORMAL LOW (ref 17.9–39.5)
TIBC: 413 ug/dL (ref 250–450)
UIBC: 353 ug/dL

## 2020-08-25 LAB — PROTIME-INR
INR: 2.7 — ABNORMAL HIGH (ref 0.8–1.2)
Prothrombin Time: 27.7 seconds — ABNORMAL HIGH (ref 11.4–15.2)

## 2020-08-25 MED ORDER — THIAMINE HCL 100 MG/ML IJ SOLN
100.0000 mg | Freq: Every day | INTRAMUSCULAR | Status: DC
  Filled 2020-08-25 (×3): qty 2

## 2020-08-25 MED ORDER — ACETAMINOPHEN 325 MG PO TABS
650.0000 mg | ORAL_TABLET | Freq: Four times a day (QID) | ORAL | Status: DC | PRN
  Administered 2020-08-26 – 2020-08-28 (×4): 650 mg via ORAL
  Filled 2020-08-25 (×4): qty 2

## 2020-08-25 MED ORDER — MORPHINE SULFATE (PF) 4 MG/ML IV SOLN
4.0000 mg | INTRAVENOUS | Status: DC | PRN
  Administered 2020-08-26 – 2020-08-28 (×5): 4 mg via INTRAVENOUS
  Filled 2020-08-25 (×5): qty 1

## 2020-08-25 MED ORDER — FENTANYL CITRATE (PF) 100 MCG/2ML IJ SOLN: 12.5000 ug | INTRAMUSCULAR | Status: DC | PRN

## 2020-08-25 MED ORDER — MORPHINE SULFATE (PF) 4 MG/ML IV SOLN
4.0000 mg | INTRAVENOUS | Status: DC | PRN
  Administered 2020-08-25 (×2): 4 mg via INTRAVENOUS
  Filled 2020-08-25 (×2): qty 1

## 2020-08-25 MED ORDER — POLYETHYLENE GLYCOL 3350 17 G PO PACK
17.0000 g | PACK | Freq: Every day | ORAL | Status: DC | PRN
  Administered 2020-08-29: 17 g via ORAL
  Filled 2020-08-25: qty 1

## 2020-08-25 MED ORDER — FENTANYL CITRATE (PF) 100 MCG/2ML IJ SOLN: 25.0000 ug | INTRAMUSCULAR | Status: DC | PRN

## 2020-08-25 MED ORDER — THIAMINE HCL 100 MG PO TABS
100.0000 mg | ORAL_TABLET | Freq: Every day | ORAL | Status: DC
  Administered 2020-08-26 – 2020-09-02 (×8): 100 mg via ORAL
  Filled 2020-08-25 (×8): qty 1

## 2020-08-25 MED ORDER — ONDANSETRON HCL 4 MG PO TABS: 4.0000 mg | ORAL_TABLET | Freq: Four times a day (QID) | ORAL | Status: DC | PRN

## 2020-08-25 MED ORDER — LORAZEPAM 2 MG/ML IJ SOLN
1.0000 mg | INTRAMUSCULAR | Status: DC | PRN
Start: 2020-08-25 — End: 2020-08-27

## 2020-08-25 MED ORDER — FOLIC ACID 1 MG PO TABS
1.0000 mg | ORAL_TABLET | Freq: Every day | ORAL | Status: DC
  Administered 2020-08-26 – 2020-09-02 (×8): 1 mg via ORAL
  Filled 2020-08-25 (×8): qty 1

## 2020-08-25 MED ORDER — HYDRALAZINE HCL 25 MG PO TABS
25.0000 mg | ORAL_TABLET | Freq: Every day | ORAL | Status: DC
  Administered 2020-08-26 – 2020-09-02 (×6): 25 mg via ORAL
  Filled 2020-08-25 (×7): qty 1

## 2020-08-25 MED ORDER — ADULT MULTIVITAMIN W/MINERALS CH
1.0000 | ORAL_TABLET | Freq: Every day | ORAL | Status: DC
  Administered 2020-08-26 – 2020-09-02 (×8): 1 via ORAL
  Filled 2020-08-25 (×8): qty 1

## 2020-08-25 MED ORDER — FUROSEMIDE 40 MG PO TABS
40.0000 mg | ORAL_TABLET | Freq: Every day | ORAL | Status: DC
  Administered 2020-08-26 – 2020-09-02 (×8): 40 mg via ORAL
  Filled 2020-08-25 (×8): qty 1

## 2020-08-25 MED ORDER — LORAZEPAM 1 MG PO TABS: 1.0000 mg | ORAL_TABLET | ORAL | Status: DC | PRN

## 2020-08-25 MED ORDER — FLUTICASONE PROPIONATE 50 MCG/ACT NA SUSP
1.0000 | Freq: Every day | NASAL | Status: DC | PRN
  Filled 2020-08-25: qty 16

## 2020-08-25 MED ORDER — MORPHINE SULFATE (PF) 2 MG/ML IV SOLN
2.0000 mg | INTRAVENOUS | Status: DC | PRN
Start: 2020-08-25 — End: 2020-08-28
  Administered 2020-08-25: 2 mg via INTRAVENOUS
  Filled 2020-08-25: qty 1

## 2020-08-25 MED ORDER — ACETAMINOPHEN 650 MG RE SUPP: 650.0000 mg | Freq: Four times a day (QID) | RECTAL | Status: DC | PRN

## 2020-08-25 MED ORDER — ISOSORBIDE MONONITRATE ER 30 MG PO TB24
30.0000 mg | ORAL_TABLET | Freq: Every day | ORAL | Status: DC
Start: 2020-08-26 — End: 2020-09-02
  Administered 2020-08-26 – 2020-09-02 (×8): 30 mg via ORAL
  Filled 2020-08-25 (×8): qty 1

## 2020-08-25 MED ORDER — ONDANSETRON HCL 4 MG/2ML IJ SOLN: 4.0000 mg | Freq: Four times a day (QID) | INTRAMUSCULAR | Status: DC | PRN

## 2020-08-25 MED ORDER — BISOPROLOL FUMARATE 5 MG PO TABS
5.0000 mg | ORAL_TABLET | Freq: Every day | ORAL | Status: DC
  Administered 2020-08-26 – 2020-09-02 (×8): 5 mg via ORAL
  Filled 2020-08-25 (×8): qty 1

## 2020-08-25 NOTE — ED Provider Notes (Signed)
Emergency Department Provider Note   I have reviewed the triage vital signs and the nursing notes.   HISTORY  Chief Complaint Fall (Left leg pain/deformity)   HPI Richard Spears is a 87 y.o. male with past medical history reviewed below including paroxysmal atrial fibrillation and mechanical valve on Coumadin presents to the emergency department after mechanical fall w/o head trauma.  Patient tells me he is walking to the car when he turned to look around, lost his balance, fell primarily on his left side.  He has severe pain in the left leg.  He did not strike his head.  He is not having pain in his arms or shoulders.  No back or neck pain.  No abdominal or chest pain.  No presyncopal symptoms.  No shortness of breath.  EMS placed the patient in a position of comfort and administered 150 mg of fentanyl in route.  Patient continues to have severe pain, without radiation.  Denies numbness.   Past Medical History:  Diagnosis Date  . Anemia    leakoppenia  . BPH (benign prostatic hypertrophy)   . Bullous pemphigoid    [name], March 2011, right forearm squamous cell carcinoma  . Chronic anticoagulation    systemic  . Colon polyp    transverse, 2002  . History of peptic ulcer    remote, 3/95  . Hx of actinic keratosis   . Hx of basal cell carcinoma   . Hx of squamous cell carcinoma of skin   . Hyperlipidemia   . Left inguinal hernia   . Moderate aortic insufficiency 2009   audible aortic insufficiency on 1/09 echo  . PAF (paroxysmal atrial fibrillation) ([place]) 01/17/2014   On Warfarin.  . S/P mitral valve replacement with metallic valve 40/8144   INR goal 2.5-3.5, St Jude,   . 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 Closed displaced fracture of distal epiphysis of left femur ([place]) 08/25/2020  . Macrocytic anemia 08/25/2020  . Fall at home, initial encounter 08/25/2020  .  Alcohol use 08/25/2020  . 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 Hip fracture ([place]) 10/09/2017  . Essential hypertension 05/12/2017  . Exertional dyspnea 01/18/2016  . Venous insufficiency 02/14/2015  . Cancer of skin of right lower leg 10/31/2014  . AI (aortic insufficiency) 01/23/2014  . Longstanding persistent atrial fibrillation: CHA2DS2-VASc Score 3 01/17/2014  . Hyperlipidemia   . S/P MVR (mitral valve replacement) 04/08/1996    Past Surgical History:  Procedure Laterality Date  . Electrodesiccation and Curettage and Shave Biopsy Right    Right medial, anterio tibia: Well differentiated Squamous Cell  . hip replacements Left    10 years ago  . MITRAL VALVE REPLACEMENT  03/1996   St. Jude mechanical valve  . TOTAL HIP ARTHROPLASTY Right 10/12/2017   Procedure: RIGHT TOTAL HIP ARTHROPLASTY ANTERIOR APPROACH;  Surgeon: Gaynelle Arabian, MD;  Location: [facility];  Service: Orthopedics;  Laterality: Right;  . TRANSTHORACIC ECHOCARDIOGRAM  12/2018   Unable to assess diastolic function because of A. fib. Normal RV function, but moderately elevated RVSP.  Severe biatrial enlargement. S/P St Jude bileaflet mechanical MVR that appears to be functioning normally. Mitral valve regurgitation cannot assess due to mechanical valve shadowing. MV Mean grad: 7.0 mmHg MV Area (PHT): 3.38 cm (stable for valve).  Mild Ao Sclerosis, Mild-Mod AI  . TRANSTHORACIC ECHOCARDIOGRAM  08/'17; 10/'18  a) Mild conc LVH. EF 55-60%. No RWMA. Mod AI. Mechanical MV prosthesis functioning properly. LAD dilation.;; b)  EF 55-60%.  Mo AI.  Bileaflet Saint Jude mechanical MV with no paravalvular leak.  Severe LA dilation.  Minimally elevated PAP    Allergies Flexeril [cyclobenzaprine]  Family History  Problem Relation Age of Onset  . Hypertension Mother   . Lung cancer Sister   . COPD Brother   . Cancer Brother   . Other Sister        polio  . Lupus Son     Social History Social History   Tobacco Use   . Smoking status: Former Smoker    Packs/day: 1.00    Years: 13.00    Pack years: 13.00    Types: Cigarettes    Start date: 21    Quit date: 1960    Years since quitting: 62.2  . Smokeless tobacco: Never Used  Vaping Use  . Vaping Use: Never used  Substance Use Topics  . Alcohol use: Yes    Alcohol/week: 0.0 standard drinks    Comment: 1-2 drinks per day  . Drug use: Never    Review of Systems  Constitutional: No fever/chills Eyes: No visual changes. ENT: No sore throat. Cardiovascular: Denies chest pain. Respiratory: Denies shortness of breath. Gastrointestinal: No abdominal pain.  No nausea, no vomiting.  No diarrhea.  No constipation. Genitourinary: Negative for dysuria. Musculoskeletal: Negative for back pain. Positive left leg pain.  Skin: Negative for rash. Neurological: Negative for headaches, focal weakness or numbness.  10-point ROS otherwise negative.  ____________________________________________   PHYSICAL EXAM:  VITAL SIGNS: ED Triage Vitals  Enc Vitals Group     BP 08/25/20 1611 (!) 155/109     Pulse Rate 08/25/20 1611 77     Resp 08/25/20 1611 17     Temp 08/25/20 1611 98.7 F (37.1 C)     Temp Source 08/25/20 1611 Oral     SpO2 08/25/20 1611 97 %   Constitutional: Alert and oriented. Well appearing and in no acute distress. Eyes: Conjunctivae are normal.  Head: Atraumatic. Nose: No congestion/rhinnorhea. Mouth/Throat: Mucous membranes are moist.   Neck: No stridor.  No cervical spine tenderness to palpation. Cardiovascular: Normal rate, regular rhythm. Good peripheral circulation. Grossly normal heart sounds.   Respiratory: Normal respiratory effort.  No retractions. Lungs CTAB. Gastrointestinal: Soft and nontender. No distention.  Musculoskeletal: Patient with left hip and knee flexed.  Tenderness through the mid thigh with exquisite tenderness with any attempted range of motion which was not pushed.  Fairly minimal pain around the knee.   No lower leg or foot tenderness.  Normal pulses and sensation in the left foot.  Neurologic:  Normal speech and language. No gross focal neurologic deficits are appreciated.  Skin:  Skin is warm, dry and intact. No rash noted.  ____________________________________________   LABS (all labs ordered are listed, but only abnormal results are displayed)  Labs Reviewed  BASIC METABOLIC PANEL - Abnormal; Notable for the following components:      Result Value   Glucose, Bld 127 (*)    BUN 32 (*)    Creatinine, Ser 1.53 (*)    Calcium 8.6 (*)    GFR, Estimated 42 (*)    All other components within normal limits  CBC WITH DIFFERENTIAL/PLATELET - Abnormal; Notable for the following components:   RBC 2.66 (*)    Hemoglobin 8.8 (*)    HCT 27.7 (*)    MCV 104.1 (*)  RDW 15.7 (*)    Platelets 136 (*)    Lymphs Abs 0.3 (*)    All other components within normal limits  PROTIME-INR - Abnormal; Notable for the following components:   Prothrombin Time 27.7 (*)    INR 2.7 (*)    All other components within normal limits  COMPREHENSIVE METABOLIC PANEL - Abnormal; Notable for the following components:   Glucose, Bld 120 (*)    BUN 28 (*)    Creatinine, Ser 1.37 (*)    Calcium 8.4 (*)    Total Protein 5.3 (*)    Albumin 2.9 (*)    GFR, Estimated 48 (*)    All other components within normal limits  APTT - Abnormal; Notable for the following components:   aPTT 43 (*)    All other components within normal limits  PROTIME-INR - Abnormal; Notable for the following components:   Prothrombin Time 30.0 (*)    INR 3.0 (*)    All other components within normal limits  IRON AND TIBC - Abnormal; Notable for the following components:   Saturation Ratios 15 (*)    All other components within normal limits  RETICULOCYTES - Abnormal; Notable for the following components:   RBC. 2.66 (*)    Immature Retic Fract 26.0 (*)    All other components within normal limits  HEPATIC FUNCTION PANEL - Abnormal;  Notable for the following components:   Total Protein 5.8 (*)    Albumin 3.4 (*)    All other components within normal limits  CBC - Abnormal; Notable for the following components:   WBC 3.8 (*)    RBC 2.26 (*)    Hemoglobin 7.5 (*)    HCT 23.0 (*)    MCV 101.8 (*)    Platelets 120 (*)    All other components within normal limits  CBC - Abnormal; Notable for the following components:   WBC 3.5 (*)    RBC 2.29 (*)    Hemoglobin 7.6 (*)    HCT 23.4 (*)    MCV 102.2 (*)    RDW 15.8 (*)    Platelets 130 (*)    All other components within normal limits  RESP PANEL BY RT-PCR (FLU A&B, COVID) ARPGX2  SURGICAL PCR SCREEN  MAGNESIUM  FOLATE  VITAMIN B12  VITAMIN D 25 HYDROXY (VIT D DEFICIENCY, FRACTURES)  CBC  TYPE AND SCREEN  PREPARE RBC (CROSSMATCH)   ____________________________________________  EKG  EKG interpreted in MUSE. A-fib. Normal rate. No acute ischemic change. No STEMI.  ____________________________________________  RADIOLOGY  DG Pelvis 1-2 Views  Result Date: 08/25/2020 CLINICAL DATA:  [place], left femoral deformity EXAM: PELVIS - 1-2 VIEW COMPARISON:  10/12/2017 FINDINGS: Single frontal view of the pelvis excludes portions of the proximal right femur and right iliac crest by collimation. Bilateral hip arthroplasties are identified. There are no acute displaced pelvic fractures. Soft tissues are unremarkable. Bowel gas pattern is normal. IMPRESSION: 1. Bilateral hip arthroplasties as above.  No acute pelvic fracture. Electronically Signed   By: Randa Ngo M.D.   On: 08/25/2020 17:30   DG Chest Portable 1 View  Result Date: 08/25/2020 CLINICAL DATA:  Left femur fracture, fell EXAM: PORTABLE CHEST 1 VIEW COMPARISON:  10/09/2017 FINDINGS: Single frontal view of the chest demonstrates marked enlargement the cardiac silhouette, stable. Postsurgical changes from median sternotomy and valvular replacement. Mild chronic central vascular congestion without airspace  disease, effusion, or pneumothorax. There are no acute displaced fractures. IMPRESSION: 1. Stable enlarged cardiac silhouette.  No acute process. Electronically Signed   By: Randa Ngo M.D.   On: 08/25/2020 17:32   DG FEMUR MIN 2 VIEWS LEFT  Result Date: 08/25/2020 CLINICAL DATA:  [place], left femur deformity EXAM: LEFT FEMUR 2 VIEWS COMPARISON:  None. FINDINGS: Frontal and cross-table lateral views of the left femur are obtained. Left hip arthroplasty is identified in the expected position. There is an oblique spiral fracture of the distal left femoral diaphysis, with medial displacement and varus angulation at the fracture site. Proximal migration of the distal fracture fragment by approximately 5 cm results in foreshortening of the left leg. There is diffuse soft tissue swelling. IMPRESSION: 1. Acute spiral fracture of the distal left femoral diaphysis, with valgus angulation as well as medial and proximal displacement of the distal fracture fragment. Electronically Signed   By: Randa Ngo M.D.   On: 08/25/2020 17:32    ____________________________________________   PROCEDURES  Procedure(s) performed:   Procedures  None ____________________________________________   INITIAL IMPRESSION / ASSESSMENT AND PLAN / ED COURSE  Pertinent labs & imaging results that were available during my care of the patient were reviewed by me and considered in my medical decision making (see chart for details).   Patient presents to the emergency department for evaluation after mechanical fall with severe left leg pain.  Plan for plain films of the left hip and thigh along with labs including INR.  No outward sign of head trauma.  No history by patient report of head trauma.  No neck or back tenderness in the midline.  Patient has had bilateral hip replacements.  Differential includes hip dislocation versus femur fracture. No findings to suspect compartment syndrome. Dr. Maureen Ralphs repaired the patient's right  hip in 2019.   05:48 PM  Spoke with Dr. Channing Mutters regarding the fracture. Plan for Buck's traction (5lbs) and they will follow along with [facility] pending INR to decide on timing of the repair.   Discussed patient's case with [facility] to request admission. Patient and family (if present) updated with plan. Care transferred to [facility] service.  I reviewed all nursing notes, vitals, pertinent old records, EKGs, labs, imaging (as available).  ____________________________________________  FINAL CLINICAL IMPRESSION(S) / ED DIAGNOSES  Final diagnoses:  Displaced oblique fracture of shaft of left femur, initial encounter for closed fracture ([place])  Fall, initial encounter     MEDICATIONS GIVEN DURING THIS VISIT:  Medications  bisoprolol (ZEBETA) tablet 5 mg (5 mg Oral Given 08/26/20 1015)  furosemide (LASIX) tablet 40 mg (40 mg Oral Given 08/26/20 1015)  hydrALAZINE (APRESOLINE) tablet 25 mg (has no administration in time range)  isosorbide mononitrate (IMDUR) 24 hr tablet 30 mg (30 mg Oral Given 08/26/20 1015)  fluticasone (FLONASE) 50 MCG/ACT nasal spray 1 spray (has no administration in time range)  acetaminophen (TYLENOL) tablet 650 mg (650 mg Oral Given 08/26/20 0022)    Or  acetaminophen (TYLENOL) suppository 650 mg ( Rectal See Alternative 08/26/20 0022)  polyethylene glycol (MIRALAX / GLYCOLAX) packet 17 g (has no administration in time range)  ondansetron (ZOFRAN) tablet 4 mg (has no administration in time range)    Or  ondansetron (ZOFRAN) injection 4 mg (has no administration in time range)  morphine 2 MG/ML injection 2 mg ( Intravenous See Alternative 08/26/20 1014)    Or  morphine 4 MG/ML injection 4 mg (4 mg Intravenous Given 08/26/20 1014)  LORazepam (ATIVAN) tablet 1-4 mg (has no administration in time range)    Or  LORazepam (ATIVAN) injection 1-4 mg (  has no administration in time range)  thiamine tablet 100 mg (100 mg Oral Given 08/26/20 1015)    Or  thiamine (B-1) injection 100 mg ( Intravenous See  Alternative 09/27/53 2589)  folic acid (FOLVITE) tablet 1 mg (1 mg Oral Given 08/26/20 1015)  multivitamin with minerals tablet 1 tablet (1 tablet Oral Given 08/26/20 1015)  0.9 %  sodium chloride infusion (Manually program via Guardrails IV Fluids) (has no administration in time range)  phytonadione (VITAMIN K) tablet 5 mg (has no administration in time range)     Note:  This document was prepared using Dragon voice recognition software and may include unintentional dictation errors.  Nanda Quinton, MD, [facility] Emergency Medicine    Rahil Passey, [facility], MD 08/26/20 1356

## 2020-08-25 NOTE — H&P (Addendum)
History and Physical    Richard Spears IEP:[id number] DOB: [birth year] DOA: 08/25/2020  PCP: [name], MD  Patient coming from: via EMS   Chief Complaint:  Chief Complaint  Patient presents with  . Fall    Left leg pain/deformity     HPI:    87 year old male with past medical history of paroxysmal atrial fibrillation, mechanical mitral valve replacement (1997, on Coumadin), benign prostatic hyperplasia, osteoarthritis, previous left and right hip arthroplasties, hypertension who presents to [facility] emergency department via EMS with severe left thigh pain status post fall.  History has been obtained from the patient who is an excellent historian.  Patient explains that earlier today he was in the parking lot helping his wife to the car. He suddenly turned around to grab the walker for his wife when he states that he turned around too quickly, falling to the ground. Patient denies any loss of consciousness. Patient denies any head trauma.  Shortly after the fall the patient states that he experienced severe left thigh and leg pain. Pain is sharp and throbbing in quality, severe in intensity and radiating distally.   Pain is worse with any movement of the affected extremity. EMS was contacted who promptly came to evaluate the patient and noted deformity of the left thigh and left inward deviation of the left leg. Patient was promptly brought to [facility] emergency room for evaluation.  Upon evaluation in the emergency department, x-ray revealed acute spiral fracture of the distal left femoral diaphysis with displacement. Case was discussed with Dr. Alvan Dame with [facility] who stated that they will come evaluate the patient in the morning. The [facility] has also initiated traction on the left leg per orthopedic recommendation. The hospitalist group has now been called to assess the patient for admission the hospital.  Review of Systems:   Review of Systems   Musculoskeletal: Positive for falls, joint pain and myalgias.  All other systems reviewed and are negative.   Past Medical History:  Diagnosis Date  . Anemia    leakoppenia  . BPH (benign prostatic hypertrophy)   . Bullous pemphigoid    Richard Spears, March 2011, right forearm squamous cell carcinoma  . Chronic anticoagulation    systemic  . Colon polyp    transverse, 2002  . History of peptic ulcer    remote, 3/95  . Hx of actinic keratosis   . Hx of basal cell carcinoma   . Hx of squamous cell carcinoma of skin   . Hyperlipidemia   . Left inguinal hernia   . Moderate aortic insufficiency 2009   audible aortic insufficiency on 1/09 echo  . PAF (paroxysmal atrial fibrillation) ([place]) 01/17/2014   On Warfarin.  . S/P mitral valve replacement with metallic valve 66/0630   INR goal 2.5-3.5, St Jude,   . 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 Electrodesiccation and Curettage and Shave Biopsy Right    Right medial, anterio tibia: Well differentiated Squamous Cell  . hip replacements Left    10 years ago  . MITRAL VALVE REPLACEMENT  03/1996   St. Jude mechanical valve  . TOTAL HIP ARTHROPLASTY Right 10/12/2017   Procedure: RIGHT TOTAL HIP ARTHROPLASTY ANTERIOR APPROACH;  Surgeon: Gaynelle Arabian, MD;  Location: [facility] ORS;  Service: Orthopedics;  Laterality: Right;  . TRANSTHORACIC ECHOCARDIOGRAM  12/2018   Unable to assess diastolic function because  of A. fib. Normal RV function, but moderately elevated RVSP.  Severe biatrial enlargement. S/P St Jude bileaflet mechanical MVR that appears to be functioning normally. Mitral valve regurgitation cannot assess due to mechanical valve shadowing. MV Mean grad: 7.0 mmHg MV Area (PHT): 3.38 cm (stable for valve).  Mild Ao Sclerosis, Mild-Mod AI  . TRANSTHORACIC ECHOCARDIOGRAM  08/'17; 10/'18    a) Mild conc LVH. EF 55-60%. No RWMA. Mod AI. Mechanical MV  prosthesis functioning properly. LAD dilation.;; b)  EF 55-60%.  Mo AI.  Bileaflet Saint Jude mechanical MV with no paravalvular leak.  Severe LA dilation.  Minimally elevated PAP     reports that he quit smoking about 62 years ago. His smoking use included cigarettes. He started smoking about 75 years ago. He has a 13.00 pack-year smoking history. He has never used smokeless tobacco. He reports current alcohol use. He reports that he does not use drugs.  Allergies  Allergen Reactions  . Flexeril [Cyclobenzaprine] Diarrhea    Family History  Problem Relation Age of Onset  . Hypertension Mother   . Lung cancer Sister   . COPD Brother   . Cancer Brother   . Other Sister        polio  . Lupus Son      Prior to Admission medications   Medication Sig Start Date End Date Taking? Authorizing Provider  acetaminophen (TYLENOL) 325 MG tablet Take 650 mg by mouth at bedtime.   Yes [provider]  bisoprolol (ZEBETA) 5 MG tablet Take 1 tablet (5 mg total) by mouth daily. 02/11/20  Yes Leonie Man, MD  fluticasone [facility] ALLERGY RELIEF) 50 MCG/ACT nasal spray Place 1 spray into both nostrils daily as needed for allergies or rhinitis.   Yes [provider]  furosemide (LASIX) 40 MG tablet TAKE 1 TABLET DAILY AND TAKE AN ADDITIONAL ONE-HALF TABLET THREE DAYS A WEEK Patient taking differently: Take 40 mg by mouth daily. 09/04/19  Yes Leonie Man, MD  hydrALAZINE (APRESOLINE) 25 MG tablet Take 1 tablet (25 mg total) by mouth daily with lunch. Take extra tablet as needed for systolic BP greater than 737 mmHg. Patient taking differently: Take 25 mg by mouth daily. 06/18/19  Yes Leonie Man, MD  isosorbide mononitrate (IMDUR) 30 MG 24 hr tablet Take 1 tablet (30 mg total) by mouth daily. 04/02/19  Yes Leonie Man, MD  LORazepam (ATIVAN) 1 MG tablet Take 1 tablet (1 mg total) by mouth every 8 (eight) hours as needed for anxiety. 10/14/17  Yes Oswald Hillock, MD   Multiple Vitamins-Minerals (MULTIVITAMIN PO) Take 1 tablet by mouth daily.   Yes [provider]  Multiple Vitamins-Minerals (PRESERVISION AREDS 2 PO) Take 1 capsule by mouth daily.   Yes [provider]  warfarin (COUMADIN) 5 MG tablet Take 5 mg by mouth daily at 6 (six) AM.   Yes [provider]    Physical Exam: Vitals:   08/25/20 1611 08/25/20 1745 08/25/20 1830  BP: (!) 155/109 (!) 147/80 (!) 156/72  Pulse: 77 (!) 130 81  Resp: 17 14 17   Temp: 98.7 F (37.1 C)    TempSrc: Oral    SpO2: 97% 100% 98%    Constitutional: Acute alert and oriented x3, patient is in mild distress due to pain.  Skin: Notable petechia of the bilateral lower extremities. Notable hyperpigmentation of the skin of the anterior right lower extremity.  good skin turgor noted. Eyes: Pupils are equally reactive to light.  No evidence of scleral icterus or conjunctival pallor.  ENMT: Moist mucous membranes noted.  Posterior pharynx clear of any exudate or lesions.   Neck: normal, supple, no masses, no thyromegaly.  No evidence of jugular venous distension.   Respiratory: clear to auscultation bilaterally, no wheezing, no crackles. Normal respiratory effort. No accessory muscle use.  Cardiovascular: Heart rate is irregularly irregular, systolic murmur noted, mechanical click noted best over the mitral valve. No extremity edema. 2+ pedal pulses. No carotid bruits.  Chest:   Nontender without crepitus or deformity.   Back:   Nontender without crepitus or deformity. Abdomen: Abdomen is soft and nontender.  No evidence of intra-abdominal masses.  Positive bowel sounds noted in all quadrants.   Musculoskeletal: Some notable swelling of the left thigh just posterior to the site of injury. Notable medial rotation of the left lower extremity. Pain with both passive and active range of motion of the left lower extremity. No contractures. Normal muscle tone.  Neurologic: CN 2-12 grossly intact.  Sensation intact. Unable to assess strength the left lower extremity due to pain. Patient is moving remainder of extremities spontaneously. Patient is following all commands.  Patient is responsive to verbal stimuli.   Psychiatric: Patient exhibits normal mood with appropriate affect.  Patient seems to possess insight as to their current situation.     Labs on Admission: I have personally reviewed following labs and imaging studies -   CBC: Recent Labs  Lab 08/25/20 1832  WBC 5.1  NEUTROABS 4.4  HGB 8.8*  HCT 27.7*  MCV 104.1*  PLT 801*   Basic Metabolic Panel: Recent Labs  Lab 08/25/20 1832  NA 135  K 4.2  CL 101  CO2 24  GLUCOSE 127*  BUN 32*  CREATININE 1.53*  CALCIUM 8.6*   GFR: CrCl cannot be calculated (Unknown ideal weight.). Liver Function Tests: No results for input(s): AST, ALT, ALKPHOS, BILITOT, PROT, ALBUMIN in the last 168 hours. No results for input(s): LIPASE, AMYLASE in the last 168 hours. No results for input(s): AMMONIA in the last 168 hours. Coagulation Profile: Recent Labs  Lab 08/25/20 1832  INR 2.7*   Cardiac Enzymes: No results for input(s): CKTOTAL, CKMB, CKMBINDEX, TROPONINI in the last 168 hours. BNP (last 3 results) No results for input(s): PROBNP in the last 8760 hours. HbA1C: No results for input(s): HGBA1C in the last 72 hours. CBG: No results for input(s): GLUCAP in the last 168 hours. Lipid Profile: No results for input(s): CHOL, HDL, LDLCALC, TRIG, CHOLHDL, LDLDIRECT in the last 72 hours. Thyroid Function Tests: No results for input(s): TSH, T4TOTAL, FREET4, T3FREE, THYROIDAB in the last 72 hours. Anemia Panel: No results for input(s): VITAMINB12, FOLATE, FERRITIN, TIBC, IRON, RETICCTPCT in the last 72 hours. Urine analysis: No results found for: COLORURINE, APPEARANCEUR, LABSPEC, [place], GLUCOSEU, [place], BILIRUBINUR, KETONESUR, PROTEINUR, UROBILINOGEN, NITRITE, LEUKOCYTESUR  Radiological Exams on Admission - Personally  Reviewed: DG Pelvis 1-2 Views  Result Date: 08/25/2020 CLINICAL DATA:  [place], left femoral deformity EXAM: PELVIS - 1-2 VIEW COMPARISON:  10/12/2017 FINDINGS: Single frontal view of the pelvis excludes portions of the proximal right femur and right iliac crest by collimation. Bilateral hip arthroplasties are identified. There are no acute displaced pelvic fractures. Soft tissues are unremarkable. Bowel gas pattern is normal. IMPRESSION: 1. Bilateral hip arthroplasties as above.  No acute pelvic fracture. Electronically Signed   By: Randa Ngo M.D.   On: 08/25/2020 17:30   DG Chest Portable 1 View  Result Date: 08/25/2020 CLINICAL DATA:  Left femur fracture, fell EXAM: PORTABLE CHEST 1 VIEW COMPARISON:  10/09/2017 FINDINGS: Single frontal view of the chest demonstrates marked enlargement the cardiac silhouette, stable. Postsurgical changes from median sternotomy and valvular replacement. Mild chronic central vascular congestion without airspace disease, effusion, or pneumothorax. There are no acute displaced fractures. IMPRESSION: 1. Stable enlarged cardiac silhouette.  No acute process. Electronically Signed   By: Randa Ngo M.D.   On: 08/25/2020 17:32   DG FEMUR MIN 2 VIEWS LEFT  Result Date: 08/25/2020 CLINICAL DATA:  [place], left femur deformity EXAM: LEFT FEMUR 2 VIEWS COMPARISON:  None. FINDINGS: Frontal and cross-table lateral views of the left femur are obtained. Left hip arthroplasty is identified in the expected position. There is an oblique spiral fracture of the distal left femoral diaphysis, with medial displacement and varus angulation at the fracture site. Proximal migration of the distal fracture fragment by approximately 5 cm results in foreshortening of the left leg. There is diffuse soft tissue swelling. IMPRESSION: 1. Acute spiral fracture of the distal left femoral diaphysis, with valgus angulation as well as medial and proximal displacement of the distal fracture fragment.  Electronically Signed   By: Randa Ngo M.D.   On: 08/25/2020 17:32    Telemetry: Personally reviewed. Atrial fibrillation at 95 bpm. No dynamic ST segment changes appreciated.  Assessment/Plan Principal Problem:   Closed displaced fracture of distal epiphysis of left femur [facility])   Patient experiencing severe left thigh and leg pain status post fall   X-ray revealed spiral fracture of the distal left femoral diaphysis with displacement  Case is already been discussed with [facility]/Dr. Alvan Dame by the emergency department staff who has recommended traction of the left lower extremity and the orthopedic surgery will evaluate the patient in the morning  Date of operative intervention will depend on logistics of anticoagulation as patient will need to be on a heparin infusion due to noted history of mitral mechanical valve replacement  As needed opiate-based analgesics for substantial pain  Bedrest for now  Orthopedic tech has already provided traction to the left lower extremity  Obtaining vitamin D level  Active Problems:   Fall at home, initial encounter   Mechanical fall without head trauma  No medical cause of fall suspected  See remainder of assessment and plan above    S/P MVR (mitral valve replacement)   Patient is status post mitral valve replacement with Saint Jude mechanical valve in 1997  Patient has been on anticoagulation with Coumadin since with target INR of 2.5-3.5  INR is currently at target at 2.7  Holding Coumadin and requesting pharmacy assistance for consultation for initiation of heparin once INR drops below 2.5  Heparin infusion can be temporarily held prior to operative intervention  Patient will likely require initiation of Lovenox bridging with reinitiation of Coumadin postoperatively    Longstanding persistent atrial fibrillation: CHA2DS2-VASc Score 3   Continue home regimen of bisoprolol  Anticoagulation as above    Essential  hypertension   Continue home regimen of bisoprolol    Macrocytic anemia    Notable substantial microcytic anemia with hemoglobin of 8.8, with surprising amount of macrocytosis  Patient states he has never been told he is anemic before  Patient does admit to his longstanding alcohol use drinking a minimum of 2 shots daily for many years  Obtaining vitamin B12 level, folate, iron panel  Cycling several CBCs in case patient is bleeding surrounding the femoral fracture  Obtaining type and screen  Ongoing alcohol use  Patient admits to ongoing use of alcohol, minimum of 2 shots daily for many years  Concern for possible abuse although patient does not admit to any red flags  Initiating CIWA protocol as a precaution  I also feel that patient's longstanding alcohol use may have to do with significant macrocytic anemia   Code Status:  Full code Family Communication: deferred   Status is: Observation  The patient remains OBS appropriate and will d/c before 2 midnights.  Dispo: The patient is from: Home              Anticipated d/c is to: Home              Patient currently not medically stable for DC  Difficult to place patient No        Vernelle Emerald MD [facility] Pager [phone]  If 7PM-7AM, please contact night-coverage [email or web address] Use universal [place] password for that web site. If you do not have the password, please call the hospital operator.  08/25/2020, 9:53 PM

## 2020-08-25 NOTE — ED Notes (Signed)
Attempted report x1. 

## 2020-08-25 NOTE — Progress Notes (Signed)
Orthopedic Tech Progress Note Patient Details:  Richard Spears Nov 19, 1925 [id number]  Musculoskeletal Traction Type of Traction: [place] Skin Traction Traction Location: lle. An RN helped with positioning of patient and traction application. Traction Weight: 5 lbs   Post Interventions Patient Tolerated: Well Instructions Provided: Care of device,Adjustment of device   Karolee Stamps 08/25/2020, 9:05 PM

## 2020-08-25 NOTE — ED Triage Notes (Signed)
Brought in by [facility].  Fell in parking lot and has left hip/leg pain and deformity.  Pt can wiggle toes.  Pt is on blood thinner and DID NOT hit head.  Pt has had a total of 150mg  of Fentanyl en route to ED.

## 2020-08-26 DIAGNOSIS — K5900 Constipation, unspecified: Secondary | ICD-10-CM | POA: Diagnosis not present

## 2020-08-26 DIAGNOSIS — S72442A Displaced fracture of lower epiphysis (separation) of left femur, initial encounter for closed fracture: Secondary | ICD-10-CM | POA: Diagnosis not present

## 2020-08-26 DIAGNOSIS — Z7401 Bed confinement status: Secondary | ICD-10-CM | POA: Diagnosis not present

## 2020-08-26 DIAGNOSIS — N400 Enlarged prostate without lower urinary tract symptoms: Secondary | ICD-10-CM | POA: Diagnosis present

## 2020-08-26 DIAGNOSIS — M9702XA Periprosthetic fracture around internal prosthetic left hip joint, initial encounter: Secondary | ICD-10-CM | POA: Diagnosis not present

## 2020-08-26 DIAGNOSIS — S72402A Unspecified fracture of lower end of left femur, initial encounter for closed fracture: Secondary | ICD-10-CM | POA: Diagnosis not present

## 2020-08-26 DIAGNOSIS — W010XXA Fall on same level from slipping, tripping and stumbling without subsequent striking against object, initial encounter: Secondary | ICD-10-CM | POA: Diagnosis present

## 2020-08-26 DIAGNOSIS — Z96643 Presence of artificial hip joint, bilateral: Secondary | ICD-10-CM | POA: Diagnosis present

## 2020-08-26 DIAGNOSIS — M2550 Pain in unspecified joint: Secondary | ICD-10-CM | POA: Diagnosis not present

## 2020-08-26 DIAGNOSIS — I480 Paroxysmal atrial fibrillation: Secondary | ICD-10-CM | POA: Diagnosis not present

## 2020-08-26 DIAGNOSIS — Z96642 Presence of left artificial hip joint: Secondary | ICD-10-CM | POA: Diagnosis not present

## 2020-08-26 DIAGNOSIS — I10 Essential (primary) hypertension: Secondary | ICD-10-CM | POA: Diagnosis present

## 2020-08-26 DIAGNOSIS — N179 Acute kidney failure, unspecified: Secondary | ICD-10-CM | POA: Diagnosis not present

## 2020-08-26 DIAGNOSIS — Z7289 Other problems related to lifestyle: Secondary | ICD-10-CM | POA: Diagnosis not present

## 2020-08-26 DIAGNOSIS — E785 Hyperlipidemia, unspecified: Secondary | ICD-10-CM | POA: Diagnosis present

## 2020-08-26 DIAGNOSIS — Y92009 Unspecified place in unspecified non-institutional (private) residence as the place of occurrence of the external cause: Secondary | ICD-10-CM | POA: Diagnosis not present

## 2020-08-26 DIAGNOSIS — Z952 Presence of prosthetic heart valve: Secondary | ICD-10-CM | POA: Diagnosis not present

## 2020-08-26 DIAGNOSIS — Z86007 Personal history of in-situ neoplasm of skin: Secondary | ICD-10-CM | POA: Diagnosis not present

## 2020-08-26 DIAGNOSIS — D509 Iron deficiency anemia, unspecified: Secondary | ICD-10-CM | POA: Diagnosis present

## 2020-08-26 DIAGNOSIS — S79929A Unspecified injury of unspecified thigh, initial encounter: Secondary | ICD-10-CM | POA: Diagnosis not present

## 2020-08-26 DIAGNOSIS — Z87891 Personal history of nicotine dependence: Secondary | ICD-10-CM | POA: Diagnosis not present

## 2020-08-26 DIAGNOSIS — Z20822 Contact with and (suspected) exposure to covid-19: Secondary | ICD-10-CM | POA: Diagnosis present

## 2020-08-26 DIAGNOSIS — S72342D Displaced spiral fracture of shaft of left femur, subsequent encounter for closed fracture with routine healing: Secondary | ICD-10-CM | POA: Diagnosis not present

## 2020-08-26 DIAGNOSIS — Z8711 Personal history of peptic ulcer disease: Secondary | ICD-10-CM | POA: Diagnosis not present

## 2020-08-26 DIAGNOSIS — Y9301 Activity, walking, marching and hiking: Secondary | ICD-10-CM | POA: Diagnosis present

## 2020-08-26 DIAGNOSIS — D539 Nutritional anemia, unspecified: Secondary | ICD-10-CM | POA: Diagnosis present

## 2020-08-26 DIAGNOSIS — Z86010 Personal history of colonic polyps: Secondary | ICD-10-CM | POA: Diagnosis not present

## 2020-08-26 DIAGNOSIS — M25552 Pain in left hip: Secondary | ICD-10-CM | POA: Diagnosis not present

## 2020-08-26 DIAGNOSIS — W19XXXA Unspecified fall, initial encounter: Secondary | ICD-10-CM | POA: Diagnosis not present

## 2020-08-26 DIAGNOSIS — I4811 Longstanding persistent atrial fibrillation: Secondary | ICD-10-CM | POA: Diagnosis not present

## 2020-08-26 LAB — CBC
HCT: 23 % — ABNORMAL LOW (ref 39.0–52.0)
HCT: 23.4 % — ABNORMAL LOW (ref 39.0–52.0)
HCT: 20.8 % — ABNORMAL LOW (ref 39.0–52.0)
Hemoglobin: 7.5 g/dL — ABNORMAL LOW (ref 13.0–17.0)
Hemoglobin: 7.6 g/dL — ABNORMAL LOW (ref 13.0–17.0)
Hemoglobin: 6.9 g/dL — CL (ref 13.0–17.0)
MCH: 33.2 pg (ref 26.0–34.0)
MCH: 33.2 pg (ref 26.0–34.0)
MCH: 33.7 pg (ref 26.0–34.0)
MCHC: 32.6 g/dL (ref 30.0–36.0)
MCHC: 32.5 g/dL (ref 30.0–36.0)
MCHC: 33.2 g/dL (ref 30.0–36.0)
MCV: 101.8 fL — ABNORMAL HIGH (ref 80.0–100.0)
MCV: 102.2 fL — ABNORMAL HIGH (ref 80.0–100.0)
MCV: 101.5 fL — ABNORMAL HIGH (ref 80.0–100.0)
Platelets: 120 10*3/uL — ABNORMAL LOW (ref 150–400)
Platelets: 130 10*3/uL — ABNORMAL LOW (ref 150–400)
Platelets: 122 10*3/uL — ABNORMAL LOW (ref 150–400)
RBC: 2.26 MIL/uL — ABNORMAL LOW (ref 4.22–5.81)
RBC: 2.29 MIL/uL — ABNORMAL LOW (ref 4.22–5.81)
RBC: 2.05 MIL/uL — ABNORMAL LOW (ref 4.22–5.81)
RDW: 15.3 % (ref 11.5–15.5)
RDW: 15.8 % — ABNORMAL HIGH (ref 11.5–15.5)
RDW: 15.8 % — ABNORMAL HIGH (ref 11.5–15.5)
WBC: 3.8 10*3/uL — ABNORMAL LOW (ref 4.0–10.5)
WBC: 3.5 10*3/uL — ABNORMAL LOW (ref 4.0–10.5)
WBC: 3.6 10*3/uL — ABNORMAL LOW (ref 4.0–10.5)
nRBC: 0 % (ref 0.0–0.2)
nRBC: 0 % (ref 0.0–0.2)
nRBC: 0 % (ref 0.0–0.2)

## 2020-08-26 LAB — COMPREHENSIVE METABOLIC PANEL
ALT: 21 U/L (ref 0–44)
AST: 34 U/L (ref 15–41)
Albumin: 2.9 g/dL — ABNORMAL LOW (ref 3.5–5.0)
Alkaline Phosphatase: 42 U/L (ref 38–126)
Anion gap: 8 (ref 5–15)
BUN: 28 mg/dL — ABNORMAL HIGH (ref 8–23)
CO2: 25 mmol/L (ref 22–32)
Calcium: 8.4 mg/dL — ABNORMAL LOW (ref 8.9–10.3)
Chloride: 104 mmol/L (ref 98–111)
Creatinine, Ser: 1.37 mg/dL — ABNORMAL HIGH (ref 0.61–1.24)
GFR, Estimated: 48 mL/min — ABNORMAL LOW (ref >60)
Glucose, Bld: 120 mg/dL — ABNORMAL HIGH (ref 70–99)
Potassium: 4.4 mmol/L (ref 3.5–5.1)
Sodium: 137 mmol/L (ref 135–145)
Total Bilirubin: 1.1 mg/dL (ref 0.3–1.2)
Total Protein: 5.3 g/dL — ABNORMAL LOW (ref 6.5–8.1)

## 2020-08-26 LAB — MAGNESIUM: Magnesium: 2.3 mg/dL (ref 1.7–2.4)

## 2020-08-26 LAB — PREPARE RBC (CROSSMATCH)

## 2020-08-26 LAB — PROTIME-INR
INR: 3 — ABNORMAL HIGH (ref 0.8–1.2)
Prothrombin Time: 30 seconds — ABNORMAL HIGH (ref 11.4–15.2)

## 2020-08-26 LAB — SURGICAL PCR SCREEN
MRSA, PCR: NEGATIVE
Staphylococcus aureus: NEGATIVE

## 2020-08-26 LAB — VITAMIN D 25 HYDROXY (VIT D DEFICIENCY, FRACTURES): Vit D, 25-Hydroxy: 30.34 ng/mL (ref 30–100)

## 2020-08-26 LAB — APTT: aPTT: 43 seconds — ABNORMAL HIGH (ref 24–36)

## 2020-08-26 LAB — FOLATE: Folate: 44 ng/mL (ref >5.9)

## 2020-08-26 MED ORDER — SODIUM CHLORIDE 0.9 % IV SOLN: INTRAVENOUS | Status: AC

## 2020-08-26 MED ORDER — SODIUM CHLORIDE 0.9% IV SOLUTION: Freq: Once | INTRAVENOUS | Status: DC

## 2020-08-26 MED ORDER — PHYTONADIONE 5 MG PO TABS
5.0000 mg | ORAL_TABLET | Freq: Once | ORAL | Status: AC
  Administered 2020-08-26: 5 mg via ORAL
  Filled 2020-08-26: qty 1

## 2020-08-26 MED ORDER — VITAMIN D 25 MCG (1000 UNIT) PO TABS
1000.0000 [IU] | ORAL_TABLET | Freq: Every day | ORAL | Status: DC
  Administered 2020-08-26 – 2020-08-28 (×3): 1000 [IU] via ORAL
  Filled 2020-08-26 (×3): qty 1

## 2020-08-26 MED ORDER — METHOCARBAMOL 500 MG PO TABS
500.0000 mg | ORAL_TABLET | Freq: Three times a day (TID) | ORAL | Status: DC | PRN
  Administered 2020-08-27 – 2020-08-28 (×2): 500 mg via ORAL
  Filled 2020-08-26 (×2): qty 1

## 2020-08-26 NOTE — Progress Notes (Signed)
PROGRESS NOTE    NISHAAN Spears   ZOX:[id number]  DOB: 1925/11/27  DOA: 08/25/2020     0  PCP: [name], MD  CC: fall at home  Hospital Course: [name] is a 87 yo male with PMH PAF, mechanical MVR (1997, on Coumadin), BPH, OA, previous R/L hip arthroplasties, hypertension who presented to the ER with severe left thigh pain after a fall at home.  Fall was considered mechanical in nature.   Imaging revealed a significant acute spiral fracture of the distal left femoral diaphysis with displacement.  Traction was initiated and he was admitted for further work-up and surgical evaluation.  Interval History:  Seen this morning resting in bed.  He has intermittent spasms in his left lower extremity which causes significant pain.  In between these episodes he was noted to be resting comfortably in bed.  ROS: Constitutional: negative for chills and fevers, Respiratory: negative for cough, Cardiovascular: negative for chest pain and Gastrointestinal: negative for abdominal pain  Assessment & Plan:  Closed displaced fracture of distal epiphysis of left femur - s/p mechanical fall at home - holding coumadin in anticipation of surgery; s/p Vit K per ortho today; will need bridging with heparin drip pre-op and possibly can do Lovenox postop if stable to bridge back - continue pain control  -Tentative plan is for surgical repair on Thursday - continue LLE neurovascular checks   History of mechanical MVR - goal INR 2.5 - 3.5 - holding warf for surgery - start heparin drip when INR<2.5 - will need bridging treatment back after surgery  PAF - on Coumadin chronically -Continue bisoprolol  AKI - baseline creatinine ~ 1.1 - patient presents with increase in creat >0.3 mg/dL above baseline presumed to have occurred within past 7 days PTA - continue IVF - BMP in am   HTN -Continue bisoprolol, Lasix, hydralazine, Imdur  Macrocytic anemia - iron 60, sat ratio 15%, TIBC ULN  (413) - folate 44, B12 478 - continue MVI - Hgb down to 7.6 g/dL today; no s/s bleeding or compartment syndrome; ortho transfusing 2 units PRBC  Alcohol use -Reported approximately 2 shots per day on admission -No concerns for withdrawal at this time.  If still negative on Tuesday, likely can discontinue CIWA  Old records reviewed in assessment of this patient  Antimicrobials: n/a  DVT prophylaxis: SCDs Start: 08/25/20 2054   Code Status:   Code Status: Full Code Family Communication: none present  Disposition Plan: Status is: Observation  The patient will require care spanning > 2 midnights and should be moved to inpatient because: Ongoing active pain requiring inpatient pain management, IV treatments appropriate due to intensity of illness or inability to take PO and Inpatient level of care appropriate due to severity of illness  Dispo: The patient is from: Home              Anticipated d/c is to: pending PT eval after surgery              Patient currently is not medically stable to d/c.   Difficult to place patient No   Risk of unplanned readmission score:     Objective: Blood pressure 117/65, pulse 100, temperature 99.8 F (37.7 C), temperature source Oral, resp. rate 18, SpO2 95 %.  Examination: General appearance: alert, cooperative and no distress Head: Normocephalic, without obvious abnormality, atraumatic Eyes: EOMI Lungs: clear to auscultation bilaterally Heart: irregularly irregular rhythm and S1, S2 normal Abdomen: normal findings: bowel sounds normal and  soft, non-tender Extremities: LLE noted in traction sling; no obvious leg/thigh swelling; appropriately TTP Skin: mobility and turgor normal Neurologic: LLE deferred but sensation intact in LLE and no other obvious focal deficits   Consultants:   Ortho surgery  Procedures:     Data Reviewed: I have personally reviewed following labs and imaging studies Results for orders placed or performed during  the hospital encounter of 08/25/20 (from the past 24 hour(s))  Resp Panel by RT-PCR (Flu A&B, Covid) Nasopharyngeal Swab     Status: None   Collection Time: 08/25/20  6:32 PM   Specimen: Nasopharyngeal Swab; Nasopharyngeal(NP) swabs in vial transport medium  Result Value Ref Range   SARS Coronavirus 2 by RT PCR NEGATIVE NEGATIVE   Influenza A by PCR NEGATIVE NEGATIVE   Influenza B by PCR NEGATIVE NEGATIVE  Basic metabolic panel     Status: Abnormal   Collection Time: 08/25/20  6:32 PM  Result Value Ref Range   Sodium 135 135 - 145 mmol/L   Potassium 4.2 3.5 - 5.1 mmol/L   Chloride 101 98 - 111 mmol/L   CO2 24 22 - 32 mmol/L   Glucose, Bld 127 (H) 70 - 99 mg/dL   BUN 32 (H) 8 - 23 mg/dL   Creatinine, Ser 1.53 (H) 0.61 - 1.24 mg/dL   Calcium 8.6 (L) 8.9 - 10.3 mg/dL   GFR, Estimated 42 (L) >60 mL/min   Anion gap 10 5 - 15  CBC with Differential     Status: Abnormal   Collection Time: 08/25/20  6:32 PM  Result Value Ref Range   WBC 5.1 4.0 - 10.5 K/uL   RBC 2.66 (L) 4.22 - 5.81 MIL/uL   Hemoglobin 8.8 (L) 13.0 - 17.0 g/dL   HCT 27.7 (L) 39.0 - 52.0 %   MCV 104.1 (H) 80.0 - 100.0 fL   MCH 33.1 26.0 - 34.0 pg   MCHC 31.8 30.0 - 36.0 g/dL   RDW 15.7 (H) 11.5 - 15.5 %   Platelets 136 (L) 150 - 400 K/uL   nRBC 0.0 0.0 - 0.2 %   Neutrophils Relative % 86 %   Neutro Abs 4.4 1.7 - 7.7 K/uL   Lymphocytes Relative 5 %   Lymphs Abs 0.3 (L) 0.7 - 4.0 K/uL   Monocytes Relative 7 %   Monocytes Absolute 0.4 0.1 - 1.0 K/uL   Eosinophils Relative 1 %   Eosinophils Absolute 0.0 0.0 - 0.5 K/uL   Basophils Relative 0 %   Basophils Absolute 0.0 0.0 - 0.1 K/uL   Immature Granulocytes 1 %   Abs Immature Granulocytes 0.03 0.00 - 0.07 K/uL  Protime-INR     Status: Abnormal   Collection Time: 08/25/20  6:32 PM  Result Value Ref Range   Prothrombin Time 27.7 (H) 11.4 - 15.2 seconds   INR 2.7 (H) 0.8 - 1.2  Iron and TIBC     Status: Abnormal   Collection Time: 08/25/20  6:32 PM  Result Value  Ref Range   Iron 60 45 - 182 ug/dL   TIBC 413 250 - 450 ug/dL   Saturation Ratios 15 (L) 17.9 - 39.5 %   UIBC 353 ug/dL  Folate     Status: None   Collection Time: 08/25/20  6:32 PM  Result Value Ref Range   Folate 44.0 >5.9 ng/mL  Vitamin B12     Status: None   Collection Time: 08/25/20  6:32 PM  Result Value Ref Range   Vitamin B-12 478  180 - 914 pg/mL  Reticulocytes     Status: Abnormal   Collection Time: 08/25/20  6:32 PM  Result Value Ref Range   Retic Ct Pct 2.8 0.4 - 3.1 %   RBC. 2.66 (L) 4.22 - 5.81 MIL/uL   Retic Count, Absolute 74.2 19.0 - 186.0 K/uL   Immature Retic Fract 26.0 (H) 2.3 - 15.9 %  Hepatic function panel     Status: Abnormal   Collection Time: 08/25/20  6:32 PM  Result Value Ref Range   Total Protein 5.8 (L) 6.5 - 8.1 g/dL   Albumin 3.4 (L) 3.5 - 5.0 g/dL   AST 39 15 - 41 U/L   ALT 24 0 - 44 U/L   Alkaline Phosphatase 54 38 - 126 U/L   Total Bilirubin 1.1 0.3 - 1.2 mg/dL   Bilirubin, Direct 0.2 0.0 - 0.2 mg/dL   Indirect Bilirubin 0.9 0.3 - 0.9 mg/dL  Type and screen [place]     Status: None (Preliminary result)   Collection Time: 08/25/20 10:20 PM  Result Value Ref Range   ABO/RH(D) O POS    Antibody Screen NEG    Sample Expiration      08/28/2020,2359 Performed at [facility], [place] [address], [place], [place] [address]    Unit Number [id number]    Blood Component Type RBC LR PHER1    Unit division 00    Status of Unit ALLOCATED    Transfusion Status OK TO TRANSFUSE    Crossmatch Result Compatible    Unit Number [id number]    Blood Component Type RED CELLS,LR    Unit division 00    Status of Unit ALLOCATED    Transfusion Status OK TO TRANSFUSE    Crossmatch Result Compatible   Comprehensive metabolic panel     Status: Abnormal   Collection Time: 08/26/20  3:32 AM  Result Value Ref Range   Sodium 137 135 - 145 mmol/L   Potassium 4.4 3.5 - 5.1 mmol/L   Chloride 104 98 - 111 mmol/L   CO2 25 22 - 32  mmol/L   Glucose, Bld 120 (H) 70 - 99 mg/dL   BUN 28 (H) 8 - 23 mg/dL   Creatinine, Ser 1.37 (H) 0.61 - 1.24 mg/dL   Calcium 8.4 (L) 8.9 - 10.3 mg/dL   Total Protein 5.3 (L) 6.5 - 8.1 g/dL   Albumin 2.9 (L) 3.5 - 5.0 g/dL   AST 34 15 - 41 U/L   ALT 21 0 - 44 U/L   Alkaline Phosphatase 42 38 - 126 U/L   Total Bilirubin 1.1 0.3 - 1.2 mg/dL   GFR, Estimated 48 (L) >60 mL/min   Anion gap 8 5 - 15  Magnesium     Status: None   Collection Time: 08/26/20  3:32 AM  Result Value Ref Range   Magnesium 2.3 1.7 - 2.4 mg/dL  APTT     Status: Abnormal   Collection Time: 08/26/20  3:32 AM  Result Value Ref Range   aPTT 43 (H) 24 - 36 seconds  Protime-INR     Status: Abnormal   Collection Time: 08/26/20  3:32 AM  Result Value Ref Range   Prothrombin Time 30.0 (H) 11.4 - 15.2 seconds   INR 3.0 (H) 0.8 - 1.2  CBC     Status: Abnormal   Collection Time: 08/26/20  3:32 AM  Result Value Ref Range   WBC 3.8 (L) 4.0 - 10.5 K/uL   RBC  2.26 (L) 4.22 - 5.81 MIL/uL   Hemoglobin 7.5 (L) 13.0 - 17.0 g/dL   HCT 23.0 (L) 39.0 - 52.0 %   MCV 101.8 (H) 80.0 - 100.0 fL   MCH 33.2 26.0 - 34.0 pg   MCHC 32.6 30.0 - 36.0 g/dL   RDW 15.3 11.5 - 15.5 %   Platelets 120 (L) 150 - 400 K/uL   nRBC 0.0 0.0 - 0.2 %  VITAMIN D 25 Hydroxy (Vit-D Deficiency, Fractures)     Status: None   Collection Time: 08/26/20  3:32 AM  Result Value Ref Range   Vit D, 25-Hydroxy 30.34 30 - 100 ng/mL  CBC     Status: Abnormal   Collection Time: 08/26/20  9:15 AM  Result Value Ref Range   WBC 3.5 (L) 4.0 - 10.5 K/uL   RBC 2.29 (L) 4.22 - 5.81 MIL/uL   Hemoglobin 7.6 (L) 13.0 - 17.0 g/dL   HCT 23.4 (L) 39.0 - 52.0 %   MCV 102.2 (H) 80.0 - 100.0 fL   MCH 33.2 26.0 - 34.0 pg   MCHC 32.5 30.0 - 36.0 g/dL   RDW 15.8 (H) 11.5 - 15.5 %   Platelets 130 (L) 150 - 400 K/uL   nRBC 0.0 0.0 - 0.2 %  Prepare RBC (crossmatch)     Status: None   Collection Time: 08/26/20 11:37 AM  Result Value Ref Range   Order Confirmation      ORDER  PROCESSED BY BLOOD BANK PER DR. PATRICK ONLY 1 UNIT AT THIS TIME, WILL HAVE 1 ON HOLD FOR OR Performed at [facility], [place] [address], [place], [place] [address]     Recent Results (from the past 240 hour(s))  Resp Panel by RT-PCR (Flu A&B, Covid) Nasopharyngeal Swab     Status: None   Collection Time: 08/25/20  6:32 PM   Specimen: Nasopharyngeal Swab; Nasopharyngeal(NP) swabs in vial transport medium  Result Value Ref Range Status   SARS Coronavirus 2 by RT PCR NEGATIVE NEGATIVE Final    Comment: (NOTE) SARS-CoV-2 target nucleic acids are NOT DETECTED.  The SARS-CoV-2 RNA is generally detectable in upper respiratory specimens during the acute phase of infection. The lowest concentration of SARS-CoV-2 viral copies this assay can detect is 138 copies/mL. A negative result does not preclude SARS-Cov-2 infection and should not be used as the sole basis for treatment or other patient management decisions. A negative result may occur with  improper specimen collection/handling, submission of specimen other than nasopharyngeal swab, presence of viral mutation(s) within the areas targeted by this assay, and inadequate number of viral copies(<138 copies/mL). A negative result must be combined with clinical observations, patient history, and epidemiological information. The expected result is Negative.  Fact Sheet for Patients:  EntrepreneurPulse.com.au  Fact Sheet for Healthcare Providers:  IncredibleEmployment.be  This test is no t yet approved or cleared by the [place] FDA and  has been authorized for detection and/or diagnosis of SARS-CoV-2 by FDA under an Emergency Use Authorization (EUA). This EUA will remain  in effect (meaning this test can be used) for the duration of the COVID-19 declaration under Section 564(b)(1) of the Act, 21 U.S.C.section 360bbb-3(b)(1), unless the authorization is terminated  or revoked sooner.        Influenza A by PCR NEGATIVE NEGATIVE Final   Influenza B by PCR NEGATIVE NEGATIVE Final    Comment: (NOTE) The Xpert Xpress SARS-CoV-2/FLU/RSV plus assay is intended as an aid in the diagnosis of influenza  from Nasopharyngeal swab specimens and should not be used as a sole basis for treatment. Nasal washings and aspirates are unacceptable for Xpert Xpress SARS-CoV-2/FLU/RSV testing.  Fact Sheet for Patients: EntrepreneurPulse.com.au  Fact Sheet for Healthcare Providers: IncredibleEmployment.be  This test is not yet approved or cleared by the [place] FDA and has been authorized for detection and/or diagnosis of SARS-CoV-2 by FDA under an Emergency Use Authorization (EUA). This EUA will remain in effect (meaning this test can be used) for the duration of the COVID-19 declaration under Section 564(b)(1) of the Act, 21 U.S.C. section 360bbb-3(b)(1), unless the authorization is terminated or revoked.  Performed at [facility], [place] [address], [place], [place] [address]      Radiology Studies: DG Pelvis 1-2 Views  Result Date: 08/25/2020 CLINICAL DATA:  [place], left femoral deformity EXAM: PELVIS - 1-2 VIEW COMPARISON:  10/12/2017 FINDINGS: Single frontal view of the pelvis excludes portions of the proximal right femur and right iliac crest by collimation. Bilateral hip arthroplasties are identified. There are no acute displaced pelvic fractures. Soft tissues are unremarkable. Bowel gas pattern is normal. IMPRESSION: 1. Bilateral hip arthroplasties as above.  No acute pelvic fracture. Electronically Signed   By: Randa Ngo M.D.   On: 08/25/2020 17:30   DG Chest Portable 1 View  Result Date: 08/25/2020 CLINICAL DATA:  Left femur fracture, fell EXAM: PORTABLE CHEST 1 VIEW COMPARISON:  10/09/2017 FINDINGS: Single frontal view of the chest demonstrates marked enlargement the cardiac silhouette, stable. Postsurgical changes from median  sternotomy and valvular replacement. Mild chronic central vascular congestion without airspace disease, effusion, or pneumothorax. There are no acute displaced fractures. IMPRESSION: 1. Stable enlarged cardiac silhouette.  No acute process. Electronically Signed   By: Randa Ngo M.D.   On: 08/25/2020 17:32   DG FEMUR MIN 2 VIEWS LEFT  Result Date: 08/25/2020 CLINICAL DATA:  [place], left femur deformity EXAM: LEFT FEMUR 2 VIEWS COMPARISON:  None. FINDINGS: Frontal and cross-table lateral views of the left femur are obtained. Left hip arthroplasty is identified in the expected position. There is an oblique spiral fracture of the distal left femoral diaphysis, with medial displacement and varus angulation at the fracture site. Proximal migration of the distal fracture fragment by approximately 5 cm results in foreshortening of the left leg. There is diffuse soft tissue swelling. IMPRESSION: 1. Acute spiral fracture of the distal left femoral diaphysis, with valgus angulation as well as medial and proximal displacement of the distal fracture fragment. Electronically Signed   By: Randa Ngo M.D.   On: 08/25/2020 17:32   DG Pelvis 1-2 Views  Final Result    DG FEMUR MIN 2 VIEWS LEFT  Final Result    DG Chest Portable 1 View  Final Result      Scheduled Meds: . sodium chloride   Intravenous Once  . bisoprolol  5 mg Oral Daily  . folic acid  1 mg Oral Daily  . furosemide  40 mg Oral Daily  . hydrALAZINE  25 mg Oral Q lunch  . isosorbide mononitrate  30 mg Oral Daily  . multivitamin with minerals  1 tablet Oral Daily  . thiamine  100 mg Oral Daily   Or  . thiamine  100 mg Intravenous Daily   PRN Meds: acetaminophen **OR** acetaminophen, fluticasone, LORazepam **OR** LORazepam, morphine injection **OR** morphine injection, ondansetron **OR** ondansetron (ZOFRAN) IV, polyethylene glycol Continuous Infusions:   LOS: 0 days  Time spent: Greater than 50% of the 35 minute  visit was spent in  counseling/coordination of care for the patient as laid out in the A&P.   Dwyane Dee, MD [facility] 08/26/2020, 3:26 PM

## 2020-08-26 NOTE — Plan of Care (Signed)

## 2020-08-26 NOTE — Progress Notes (Signed)
Asked by Dr. Alvan Dame to do surgery. INR 3 and hgb 7.8 today. Will give PO vitamin K. Will transfuse 2 units PRBCs. Plan for ORIF L femur on Thursday. NPO After MN Wed night.

## 2020-08-26 NOTE — Consult Note (Addendum)
Reason for Consult: Left periprosthetic femur fracture  Referring Physician: Inda Merlin, MD  Richard Spears is an 87 y.o. male.  HPI: 87 year old male with past medical history of paroxysmal atrial fibrillation, mechanical mitral valve replacement (1997, on Coumadin), benign prostatic hyperplasia, osteoarthritis, previous left and right hip arthroplasties, hypertension who presents to [facility] emergency department via EMS with severe left thigh pain status post fall.   Patient explains that earlier today he was in the parking lot helping his wife to the car. He suddenly turned around to grab the walker for his wife when he states that he turned around too quickly, falling to the ground. Patient denies any loss of consciousness. Patient denies any head trauma.   Shortly after the fall the patient states that he experienced severe left thigh and leg pain. Pain is sharp and throbbing in quality, severe in intensity and radiating distally.   Pain is worse with any movement of the affected extremity. EMS was contacted who promptly came to evaluate the patient and noted deformity of the left thigh and left inward deviation of the left leg. Patient was promptly brought to [facility] emergency room for evaluation.    History has been obtained from the patient and previous providers notes  Past Medical History:  Diagnosis Date   Anemia    leakoppenia   BPH (benign prostatic hypertrophy)    Bullous pemphigoid    [name], March 2011, right forearm squamous cell carcinoma   Chronic anticoagulation    systemic   Colon polyp    transverse, 2002   History of peptic ulcer    remote, 3/95   Hx of actinic keratosis    Hx of basal cell carcinoma    Hx of squamous cell carcinoma of skin    Hyperlipidemia    Left inguinal hernia    Moderate aortic insufficiency 2009   audible aortic insufficiency on 1/09 echo   PAF (paroxysmal atrial fibrillation) ([place]) 01/17/2014   On  Warfarin.   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 Jude mechanical valve   TOTAL HIP ARTHROPLASTY Right 10/12/2017   Procedure: RIGHT TOTAL HIP ARTHROPLASTY ANTERIOR APPROACH;  Surgeon: Gaynelle Arabian, MD;  Location: [facility] ORS;  Service: Orthopedics;  Laterality: Right;   TRANSTHORACIC ECHOCARDIOGRAM  12/2018   Unable to assess diastolic function because of A. fib. Normal RV function, but moderately elevated RVSP.  Severe biatrial enlargement. S/P St Jude bileaflet mechanical MVR that appears to be functioning normally. Mitral valve regurgitation cannot assess due to mechanical valve shadowing. MV Mean grad: 7.0 mmHg MV Area (PHT): 3.38 cm (stable for valve).  Mild Ao Sclerosis, Mild-Mod AI   TRANSTHORACIC ECHOCARDIOGRAM  08/'17; 10/'18    a) Mild conc LVH. EF 55-60%. No RWMA. Mod AI. Mechanical MV prosthesis functioning properly. LAD dilation.;; b)  EF 55-60%.  Mo AI.  Bileaflet Saint Jude mechanical MV with no paravalvular leak.  Severe LA dilation.  Minimally elevated PAP    Family History  Problem Relation Age of Onset   Hypertension  Mother    Lung cancer Sister    COPD Brother    Cancer Brother    Other Sister        polio   Lupus Son     Social History:  reports that he quit smoking about 62 years ago. His smoking use included cigarettes. He started smoking about 75 years ago. He has a 13.00 pack-year smoking history. He has never used smokeless tobacco. He reports current alcohol use. He reports that he does not use drugs.  Allergies:  Allergies  Allergen Reactions   Flexeril [Cyclobenzaprine]  Diarrhea    Medications: I have reviewed the patient's current medications.  Results for orders placed or performed during the hospital encounter of 08/25/20 (from the past 48 hour(s))  Resp Panel by RT-PCR (Flu A&B, Covid) Nasopharyngeal Swab     Status: None   Collection Time: 08/25/20  6:32 PM   Specimen: Nasopharyngeal Swab; Nasopharyngeal(NP) swabs in vial transport medium  Result Value Ref Range   SARS Coronavirus 2 by RT PCR NEGATIVE NEGATIVE    Comment: (NOTE) SARS-CoV-2 target nucleic acids are NOT DETECTED.  The SARS-CoV-2 RNA is generally detectable in upper respiratory specimens during the acute phase of infection. The lowest concentration of SARS-CoV-2 viral copies this assay can detect is 138 copies/mL. A negative result does not preclude SARS-Cov-2 infection and should not be used as the sole basis for treatment or other patient management decisions. A negative result may occur with  improper specimen collection/handling, submission of specimen other than nasopharyngeal swab, presence of viral mutation(s) within the areas targeted by this assay, and inadequate number of viral copies(<138 copies/mL). A negative result must be combined with clinical observations, patient history, and epidemiological information. The expected result is Negative.  Fact Sheet for Patients:  EntrepreneurPulse.com.au  Fact Sheet for Healthcare Providers:  IncredibleEmployment.be  This test is no t yet approved or cleared by the [place] FDA and  has been authorized for detection and/or diagnosis of SARS-CoV-2 by FDA under an Emergency Use Authorization (EUA). This EUA will remain  in effect (meaning this test can be used) for the duration of the COVID-19 declaration under Section 564(b)(1) of the Act, 21 U.S.C.section 360bbb-3(b)(1), unless the authorization is terminated  or revoked sooner.       Influenza A by PCR NEGATIVE NEGATIVE    Influenza B by PCR NEGATIVE NEGATIVE    Comment: (NOTE) The Xpert Xpress SARS-CoV-2/FLU/RSV plus assay is intended as an aid in the diagnosis of influenza from Nasopharyngeal swab specimens and should not be used as a sole basis for treatment. Nasal washings and aspirates are unacceptable for Xpert Xpress SARS-CoV-2/FLU/RSV testing.  Fact Sheet for Patients: EntrepreneurPulse.com.au  Fact Sheet for Healthcare Providers: IncredibleEmployment.be  This test is not yet approved or cleared by the [place] FDA and has been authorized for detection and/or diagnosis of SARS-CoV-2 by FDA under an Emergency Use Authorization (EUA). This EUA will remain in effect (meaning this test can be used) for the duration of the COVID-19 declaration under Section 564(b)(1) of the Act, 21 U.S.C. section 360bbb-3(b)(1), unless the authorization is terminated or revoked.  Performed at [facility], [place] [address], [place], [place] [address]   Basic metabolic panel     Status: Abnormal   Collection Time: 08/25/20  6:32 PM  Result Value Ref Range   Sodium 135 135 - 145 mmol/L   Potassium 4.2 3.5 - 5.1 mmol/L   Chloride 101 98 - 111 mmol/L  CO2 24 22 - 32 mmol/L   Glucose, Bld 127 (H) 70 - 99 mg/dL    Comment: Glucose reference range applies only to samples taken after fasting for at least 8 hours.   BUN 32 (H) 8 - 23 mg/dL   Creatinine, Ser 1.53 (H) 0.61 - 1.24 mg/dL   Calcium 8.6 (L) 8.9 - 10.3 mg/dL   GFR, Estimated 42 (L) >60 mL/min    Comment: (NOTE) Calculated using the CKD-EPI Creatinine Equation (2021)    Anion gap 10 5 - 15    Comment: Performed at [facility] [address], [place], [place] [address]  CBC with Differential     Status: Abnormal   Collection Time: 08/25/20  6:32 PM  Result Value Ref Range   WBC 5.1 4.0 - 10.5 K/uL   RBC 2.66 (L) 4.22 - 5.81 MIL/uL   Hemoglobin 8.8 (L) 13.0 - 17.0 g/dL   HCT 27.7 (L) 39.0 - 52.0 %    MCV 104.1 (H) 80.0 - 100.0 fL   MCH 33.1 26.0 - 34.0 pg   MCHC 31.8 30.0 - 36.0 g/dL   RDW 15.7 (H) 11.5 - 15.5 %   Platelets 136 (L) 150 - 400 K/uL   nRBC 0.0 0.0 - 0.2 %   Neutrophils Relative % 86 %   Neutro Abs 4.4 1.7 - 7.7 K/uL   Lymphocytes Relative 5 %   Lymphs Abs 0.3 (L) 0.7 - 4.0 K/uL   Monocytes Relative 7 %   Monocytes Absolute 0.4 0.1 - 1.0 K/uL   Eosinophils Relative 1 %   Eosinophils Absolute 0.0 0.0 - 0.5 K/uL   Basophils Relative 0 %   Basophils Absolute 0.0 0.0 - 0.1 K/uL   Immature Granulocytes 1 %   Abs Immature Granulocytes 0.03 0.00 - 0.07 K/uL    Comment: Performed at [facility], [place] [address], [place], [place] [address]  Protime-INR     Status: Abnormal   Collection Time: 08/25/20  6:32 PM  Result Value Ref Range   Prothrombin Time 27.7 (H) 11.4 - 15.2 seconds   INR 2.7 (H) 0.8 - 1.2    Comment: (NOTE) INR goal varies based on device and disease states. Performed at [facility], [place] [address], [place], [place] [address]   Iron and TIBC     Status: Abnormal   Collection Time: 08/25/20  6:32 PM  Result Value Ref Range   Iron 60 45 - 182 ug/dL   TIBC 413 250 - 450 ug/dL   Saturation Ratios 15 (L) 17.9 - 39.5 %   UIBC 353 ug/dL    Comment: Performed at [facility], [place] [address], [place], [place] [address]  Folate     Status: None   Collection Time: 08/25/20  6:32 PM  Result Value Ref Range   Folate 44.0 >5.9 ng/mL    Comment: RESULTS CONFIRMED BY MANUAL DILUTION Performed at [facility], [place] [address] [place], [place] [address]   Vitamin B12     Status: None   Collection Time: 08/25/20  6:32 PM  Result Value Ref Range   Vitamin B-12 478 180 - 914 pg/mL    Comment: (NOTE) This assay is not validated for testing neonatal or myeloproliferative syndrome specimens for Vitamin B12 levels. Performed at [facility], [place] [address], [place], [place] [address]   Reticulocytes     Status: Abnormal   Collection  Time: 08/25/20  6:32 PM  Result Value Ref Range   Retic Ct Pct 2.8 0.4 - 3.1 %   RBC. 2.66 (L) 4.22 - 5.81 MIL/uL   Retic Count, Absolute 74.2 19.0 - 186.0 K/uL   Immature Retic Fract 26.0 (H) 2.3 - 15.9 %    Comment: Performed at [facility] [address] [place], [place] [address]  Hepatic function panel     Status: Abnormal   Collection Time: 08/25/20  6:32 PM  Result Value Ref Range   Total Protein 5.8 (L) 6.5 - 8.1 g/dL   Albumin 3.4 (L) 3.5 - 5.0 g/dL   AST 39 15 - 41 U/L   ALT 24 0 - 44 U/L   Alkaline Phosphatase 54 38 - 126 U/L   Total Bilirubin 1.1 0.3 - 1.2 mg/dL   Bilirubin, Direct 0.2 0.0 - 0.2 mg/dL   Indirect Bilirubin 0.9 0.3 - 0.9 mg/dL    Comment: Performed at [facility] [address] [place], [place] [address]  Type and screen [place]     Status: None   Collection Time: 08/25/20 10:20 PM  Result Value Ref Range   ABO/RH(D) O POS    Antibody Screen NEG    Sample Expiration      08/28/2020,2359 Performed at [facility], [place] [address], [place], [place] [address]   Comprehensive metabolic panel     Status: Abnormal   Collection Time: 08/26/20  3:32 AM  Result Value Ref Range   Sodium 137 135 - 145 mmol/L   Potassium 4.4 3.5 - 5.1 mmol/L   Chloride 104 98 - 111 mmol/L   CO2 25 22 - 32 mmol/L   Glucose, Bld 120 (H) 70 - 99 mg/dL    Comment: Glucose reference range applies only to samples taken after fasting for at least 8 hours.   BUN 28 (H) 8 - 23 mg/dL   Creatinine, Ser 1.37 (H) 0.61 - 1.24 mg/dL   Calcium 8.4 (L) 8.9 - 10.3 mg/dL   Total Protein 5.3 (L) 6.5 - 8.1 g/dL   Albumin 2.9 (L) 3.5 - 5.0 g/dL   AST 34 15 - 41 U/L   ALT 21 0 - 44 U/L   Alkaline Phosphatase 42 38 - 126 U/L   Total Bilirubin 1.1 0.3 - 1.2 mg/dL   GFR, Estimated 48 (L) >60 mL/min    Comment: (NOTE) Calculated using the CKD-EPI Creatinine Equation (2021)    Anion gap 8 5 - 15    Comment: Performed at [facility], [place]  [address] [place], [place] [address]  Magnesium     Status: None   Collection Time: 08/26/20  3:32 AM  Result Value Ref Range   Magnesium 2.3 1.7 - 2.4 mg/dL    Comment: Performed at [facility] [address], [place], [place] [address]  APTT     Status: Abnormal   Collection Time: 08/26/20  3:32 AM  Result Value Ref Range   aPTT 43 (H) 24 - 36 seconds    Comment:        IF BASELINE aPTT IS ELEVATED, SUGGEST PATIENT RISK ASSESSMENT BE USED TO DETERMINE APPROPRIATE ANTICOAGULANT THERAPY. Performed at [facility], [place] [address], [place],  [address]   Protime-INR     Status: Abnormal   Collection Time: 08/26/20  3:32 AM  Result Value Ref Range   Prothrombin Time 30.0 (H) 11.4 - 15.2 seconds   INR 3.0 (H) 0.8 -  1.2    Comment: (NOTE) INR goal varies based on device and disease states. Performed at [facility], [place] [address], [place], [place] [address]   CBC     Status: Abnormal   Collection Time: 08/26/20  3:32 AM  Result Value Ref Range   WBC 3.8 (L) 4.0 - 10.5 K/uL   RBC 2.26 (L) 4.22 - 5.81 MIL/uL   Hemoglobin 7.5 (L) 13.0 - 17.0 g/dL   HCT 23.0 (L) 39.0 - 52.0 %   MCV 101.8 (H) 80.0 - 100.0 fL   MCH 33.2 26.0 - 34.0 pg   MCHC 32.6 30.0 - 36.0 g/dL   RDW 15.3 11.5 - 15.5 %   Platelets 120 (L) 150 - 400 K/uL   nRBC 0.0 0.0 - 0.2 %    Comment: Performed at [facility], [place] [address], [place], [place] [address]    DG Pelvis 1-2 Views  Result Date: 08/25/2020 CLINICAL DATA:  [place], left femoral deformity EXAM: PELVIS - 1-2 VIEW COMPARISON:  10/12/2017 FINDINGS: Single frontal view of the pelvis excludes portions of the proximal right femur and right iliac crest by collimation. Bilateral hip arthroplasties are identified. There are no acute displaced pelvic fractures. Soft tissues are unremarkable. Bowel gas pattern is normal. IMPRESSION: 1. Bilateral hip arthroplasties as above.  No acute pelvic fracture. Electronically Signed   By: Randa Ngo M.D.   On: 08/25/2020 17:30   DG Chest Portable 1 View  Result Date: 08/25/2020 CLINICAL DATA:  Left femur fracture, fell EXAM: PORTABLE CHEST 1 VIEW COMPARISON:  10/09/2017 FINDINGS: Single frontal view of the chest demonstrates marked enlargement the cardiac silhouette, stable. Postsurgical changes from median sternotomy and valvular replacement. Mild chronic central vascular congestion without airspace disease, effusion, or pneumothorax. There are no acute displaced fractures. IMPRESSION: 1. Stable enlarged cardiac silhouette.  No acute process. Electronically Signed   By: Randa Ngo M.D.   On: 08/25/2020 17:32   DG FEMUR MIN 2 VIEWS LEFT  Result Date: 08/25/2020 CLINICAL DATA:  [place], left femur deformity EXAM: LEFT FEMUR 2 VIEWS COMPARISON:  None. FINDINGS: Frontal and cross-table lateral views of the left femur are obtained. Left hip arthroplasty is identified in the expected position. There is an oblique spiral fracture of the distal left femoral diaphysis, with medial displacement and varus angulation at the fracture site. Proximal migration of the distal fracture fragment by approximately 5 cm results in foreshortening of the left leg. There is diffuse soft tissue swelling. IMPRESSION: 1. Acute spiral fracture of the distal left femoral diaphysis, with valgus angulation as well as medial and proximal displacement of the distal fracture fragment. Electronically Signed   By: Randa Ngo M.D.   On: 08/25/2020 17:32    Review of Systems  Constitutional: Negative.   HENT: Negative.   Eyes: Negative.   Respiratory: Negative.   Cardiovascular: Negative.   Gastrointestinal: Negative.   Genitourinary: Negative.   Musculoskeletal: Positive for joint pain.  Skin: Negative.   Neurological: Negative.   Endo/Heme/Allergies: Negative.   Psychiatric/Behavioral: Negative.     Blood pressure 133/79, pulse 73, temperature 97.9 F (36.6 C), resp. rate 18, SpO2 95 %. Physical  Exam Constitutional:      Appearance: He is well-developed.  HENT:     Head: Normocephalic.  Eyes:     Pupils: Pupils are equal, round, and reactive to light.  Neck:     Thyroid: No thyromegaly.     Vascular: No JVD.  Trachea: No tracheal deviation.  Cardiovascular:     Rate and Rhythm: Normal rate and regular rhythm.     Pulses: Intact distal pulses.  Pulmonary:     Effort: Pulmonary effort is normal. No respiratory distress.     Breath sounds: Normal breath sounds. No wheezing.  Abdominal:     Palpations: Abdomen is soft.     Tenderness: There is no abdominal tenderness. There is no guarding.  Musculoskeletal:     Cervical back: Neck supple.     Left upper leg: Swelling (but compartments are soft), tenderness and bony tenderness present.  Lymphadenopathy:     Cervical: No cervical adenopathy.  Skin:    General: Skin is warm and dry.  Neurological:     Mental Status: He is alert and oriented to person, place, and time.  Psychiatric:        Mood and Affect: Mood and affect normal.     Assessment/Plan: Left periprosthetic femur fracture   Plan for ORIF of the left periprosthetic femur fracture  Regular diet now NPO after midnight Dr. Alvan Dame to discuss with partner and or other surgeon to see who might be able to fix the leg earliest Discussed the risks, benefits and expectations were discussed with the patient.  Risks including but not limited to the risk of anesthesia, blood clots, nerve damage, blood vessel damage, failure of the prosthesis, infection and up to and including death.  Patient stated he understood the risks, benefits and expectations and wishes to proceed with surgery. Fraser Din states he has difficulty urinating in the urinal and unable to get up.  Discussed condom cath     [place] 08/26/2020, 7:45 AM

## 2020-08-26 NOTE — H&P (View-Only) (Signed)
Reason for Consult: Left periprosthetic femur fracture  Referring Physician: Inda Merlin, MD  Richard Spears is an 87 y.o. male.  HPI: 87 year old male with past medical history of paroxysmal atrial fibrillation, mechanical mitral valve replacement (1997, on Coumadin), benign prostatic hyperplasia, osteoarthritis, previous left and right hip arthroplasties, hypertension who presents to [facility] emergency department via EMS with severe left thigh pain status post fall.   Patient explains that earlier today he was in the parking lot helping his wife to the car. He suddenly turned around to grab the walker for his wife when he states that he turned around too quickly, falling to the ground. Patient denies any loss of consciousness. Patient denies any head trauma.   Shortly after the fall the patient states that he experienced severe left thigh and leg pain. Pain is sharp and throbbing in quality, severe in intensity and radiating distally.   Pain is worse with any movement of the affected extremity. EMS was contacted who promptly came to evaluate the patient and noted deformity of the left thigh and left inward deviation of the left leg. Patient was promptly brought to [facility] emergency room for evaluation.    History has been obtained from the patient and previous providers notes  Past Medical History:  Diagnosis Date   Anemia    leakoppenia   BPH (benign prostatic hypertrophy)    Bullous pemphigoid    [name], March 2011, right forearm squamous cell carcinoma   Chronic anticoagulation    systemic   Colon polyp    transverse, 2002   History of peptic ulcer    remote, 3/95   Hx of actinic keratosis    Hx of basal cell carcinoma    Hx of squamous cell carcinoma of skin    Hyperlipidemia    Left inguinal hernia    Moderate aortic insufficiency 2009   audible aortic insufficiency on 1/09 echo   PAF (paroxysmal atrial fibrillation) ([place]) 01/17/2014   On  Warfarin.   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 Jude mechanical valve   TOTAL HIP ARTHROPLASTY Right 10/12/2017   Procedure: RIGHT TOTAL HIP ARTHROPLASTY ANTERIOR APPROACH;  Surgeon: Gaynelle Arabian, MD;  Location: [facility] ORS;  Service: Orthopedics;  Laterality: Right;   TRANSTHORACIC ECHOCARDIOGRAM  12/2018   Unable to assess diastolic function because of A. fib. Normal RV function, but moderately elevated RVSP.  Severe biatrial enlargement. S/P St Jude bileaflet mechanical MVR that appears to be functioning normally. Mitral valve regurgitation cannot assess due to mechanical valve shadowing. MV Mean grad: 7.0 mmHg MV Area (PHT): 3.38 cm (stable for valve).  Mild Ao Sclerosis, Mild-Mod AI   TRANSTHORACIC ECHOCARDIOGRAM  08/'17; 10/'18    a) Mild conc LVH. EF 55-60%. No RWMA. Mod AI. Mechanical MV prosthesis functioning properly. LAD dilation.;; b)  EF 55-60%.  Mo AI.  Bileaflet Saint Jude mechanical MV with no paravalvular leak.  Severe LA dilation.  Minimally elevated PAP    Family History  Problem Relation Age of Onset   Hypertension  Mother    Lung cancer Sister    COPD Brother    Cancer Brother    Other Sister        polio   Lupus Son     Social History:  reports that he quit smoking about 62 years ago. His smoking use included cigarettes. He started smoking about 75 years ago. He has a 13.00 pack-year smoking history. He has never used smokeless tobacco. He reports current alcohol use. He reports that he does not use drugs.  Allergies:  Allergies  Allergen Reactions   Flexeril [Cyclobenzaprine]  Diarrhea    Medications: I have reviewed the patient's current medications.  Results for orders placed or performed during the hospital encounter of 08/25/20 (from the past 48 hour(s))  Resp Panel by RT-PCR (Flu A&B, Covid) Nasopharyngeal Swab     Status: None   Collection Time: 08/25/20  6:32 PM   Specimen: Nasopharyngeal Swab; Nasopharyngeal(NP) swabs in vial transport medium  Result Value Ref Range   SARS Coronavirus 2 by RT PCR NEGATIVE NEGATIVE    Comment: (NOTE) SARS-CoV-2 target nucleic acids are NOT DETECTED.  The SARS-CoV-2 RNA is generally detectable in upper respiratory specimens during the acute phase of infection. The lowest concentration of SARS-CoV-2 viral copies this assay can detect is 138 copies/mL. A negative result does not preclude SARS-Cov-2 infection and should not be used as the sole basis for treatment or other patient management decisions. A negative result may occur with  improper specimen collection/handling, submission of specimen other than nasopharyngeal swab, presence of viral mutation(s) within the areas targeted by this assay, and inadequate number of viral copies(<138 copies/mL). A negative result must be combined with clinical observations, patient history, and epidemiological information. The expected result is Negative.  Fact Sheet for Patients:  EntrepreneurPulse.com.au  Fact Sheet for Healthcare Providers:  IncredibleEmployment.be  This test is no t yet approved or cleared by the [place] FDA and  has been authorized for detection and/or diagnosis of SARS-CoV-2 by FDA under an Emergency Use Authorization (EUA). This EUA will remain  in effect (meaning this test can be used) for the duration of the COVID-19 declaration under Section 564(b)(1) of the Act, 21 U.S.C.section 360bbb-3(b)(1), unless the authorization is terminated  or revoked sooner.       Influenza A by PCR NEGATIVE NEGATIVE    Influenza B by PCR NEGATIVE NEGATIVE    Comment: (NOTE) The Xpert Xpress SARS-CoV-2/FLU/RSV plus assay is intended as an aid in the diagnosis of influenza from Nasopharyngeal swab specimens and should not be used as a sole basis for treatment. Nasal washings and aspirates are unacceptable for Xpert Xpress SARS-CoV-2/FLU/RSV testing.  Fact Sheet for Patients: EntrepreneurPulse.com.au  Fact Sheet for Healthcare Providers: IncredibleEmployment.be  This test is not yet approved or cleared by the [place] FDA and has been authorized for detection and/or diagnosis of SARS-CoV-2 by FDA under an Emergency Use Authorization (EUA). This EUA will remain in effect (meaning this test can be used) for the duration of the COVID-19 declaration under Section 564(b)(1) of the Act, 21 U.S.C. section 360bbb-3(b)(1), unless the authorization is terminated or revoked.  Performed at [facility], [place] [address], [place], [place] [address]   Basic metabolic panel     Status: Abnormal   Collection Time: 08/25/20  6:32 PM  Result Value Ref Range   Sodium 135 135 - 145 mmol/L   Potassium 4.2 3.5 - 5.1 mmol/L   Chloride 101 98 - 111 mmol/L  CO2 24 22 - 32 mmol/L   Glucose, Bld 127 (H) 70 - 99 mg/dL    Comment: Glucose reference range applies only to samples taken after fasting for at least 8 hours.   BUN 32 (H) 8 - 23 mg/dL   Creatinine, Ser 1.53 (H) 0.61 - 1.24 mg/dL   Calcium 8.6 (L) 8.9 - 10.3 mg/dL   GFR, Estimated 42 (L) >60 mL/min    Comment: (NOTE) Calculated using the CKD-EPI Creatinine Equation (2021)    Anion gap 10 5 - 15    Comment: Performed at [facility] [address], [place], [place] [address]  CBC with Differential     Status: Abnormal   Collection Time: 08/25/20  6:32 PM  Result Value Ref Range   WBC 5.1 4.0 - 10.5 K/uL   RBC 2.66 (L) 4.22 - 5.81 MIL/uL   Hemoglobin 8.8 (L) 13.0 - 17.0 g/dL   HCT 27.7 (L) 39.0 - 52.0 %    MCV 104.1 (H) 80.0 - 100.0 fL   MCH 33.1 26.0 - 34.0 pg   MCHC 31.8 30.0 - 36.0 g/dL   RDW 15.7 (H) 11.5 - 15.5 %   Platelets 136 (L) 150 - 400 K/uL   nRBC 0.0 0.0 - 0.2 %   Neutrophils Relative % 86 %   Neutro Abs 4.4 1.7 - 7.7 K/uL   Lymphocytes Relative 5 %   Lymphs Abs 0.3 (L) 0.7 - 4.0 K/uL   Monocytes Relative 7 %   Monocytes Absolute 0.4 0.1 - 1.0 K/uL   Eosinophils Relative 1 %   Eosinophils Absolute 0.0 0.0 - 0.5 K/uL   Basophils Relative 0 %   Basophils Absolute 0.0 0.0 - 0.1 K/uL   Immature Granulocytes 1 %   Abs Immature Granulocytes 0.03 0.00 - 0.07 K/uL    Comment: Performed at [facility], [place] [address] [place], [place] [address]  Protime-INR     Status: Abnormal   Collection Time: 08/25/20  6:32 PM  Result Value Ref Range   Prothrombin Time 27.7 (H) 11.4 - 15.2 seconds   INR 2.7 (H) 0.8 - 1.2    Comment: (NOTE) INR goal varies based on device and disease states. Performed at [facility], [place] [address] [place], [place] [address]   Iron and TIBC     Status: Abnormal   Collection Time: 08/25/20  6:32 PM  Result Value Ref Range   Iron 60 45 - 182 ug/dL   TIBC 413 250 - 450 ug/dL   Saturation Ratios 15 (L) 17.9 - 39.5 %   UIBC 353 ug/dL    Comment: Performed at [facility], [place] [address], [place], [place] [address]  Folate     Status: None   Collection Time: 08/25/20  6:32 PM  Result Value Ref Range   Folate 44.0 >5.9 ng/mL    Comment: RESULTS CONFIRMED BY MANUAL DILUTION Performed at [facility], [place] [address], [place], [place] [address]   Vitamin B12     Status: None   Collection Time: 08/25/20  6:32 PM  Result Value Ref Range   Vitamin B-12 478 180 - 914 pg/mL    Comment: (NOTE) This assay is not validated for testing neonatal or myeloproliferative syndrome specimens for Vitamin B12 levels. Performed at [facility], [place] [address], [place], [place] [address]   Reticulocytes     Status: Abnormal   Collection  Time: 08/25/20  6:32 PM  Result Value Ref Range   Retic Ct Pct 2.8 0.4 - 3.1 %   RBC. 2.66 (L) 4.22 - 5.81 MIL/uL   Retic Count, Absolute 74.2 19.0 - 186.0 K/uL   Immature Retic Fract 26.0 (H) 2.3 - 15.9 %    Comment: Performed at [facility] [address] [place], [place] [address]  Hepatic function panel     Status: Abnormal   Collection Time: 08/25/20  6:32 PM  Result Value Ref Range   Total Protein 5.8 (L) 6.5 - 8.1 g/dL   Albumin 3.4 (L) 3.5 - 5.0 g/dL   AST 39 15 - 41 U/L   ALT 24 0 - 44 U/L   Alkaline Phosphatase 54 38 - 126 U/L   Total Bilirubin 1.1 0.3 - 1.2 mg/dL   Bilirubin, Direct 0.2 0.0 - 0.2 mg/dL   Indirect Bilirubin 0.9 0.3 - 0.9 mg/dL    Comment: Performed at [facility] [address], [place], [place] [address]  Type and screen [place]     Status: None   Collection Time: 08/25/20 10:20 PM  Result Value Ref Range   ABO/RH(D) O POS    Antibody Screen NEG    Sample Expiration      08/28/2020,2359 Performed at [facility], [place] [address] [place], [place] [address]   Comprehensive metabolic panel     Status: Abnormal   Collection Time: 08/26/20  3:32 AM  Result Value Ref Range   Sodium 137 135 - 145 mmol/L   Potassium 4.4 3.5 - 5.1 mmol/L   Chloride 104 98 - 111 mmol/L   CO2 25 22 - 32 mmol/L   Glucose, Bld 120 (H) 70 - 99 mg/dL    Comment: Glucose reference range applies only to samples taken after fasting for at least 8 hours.   BUN 28 (H) 8 - 23 mg/dL   Creatinine, Ser 1.37 (H) 0.61 - 1.24 mg/dL   Calcium 8.4 (L) 8.9 - 10.3 mg/dL   Total Protein 5.3 (L) 6.5 - 8.1 g/dL   Albumin 2.9 (L) 3.5 - 5.0 g/dL   AST 34 15 - 41 U/L   ALT 21 0 - 44 U/L   Alkaline Phosphatase 42 38 - 126 U/L   Total Bilirubin 1.1 0.3 - 1.2 mg/dL   GFR, Estimated 48 (L) >60 mL/min    Comment: (NOTE) Calculated using the CKD-EPI Creatinine Equation (2021)    Anion gap 8 5 - 15    Comment: Performed at [facility], [place]  [address], [place], [place] [address]  Magnesium     Status: None   Collection Time: 08/26/20  3:32 AM  Result Value Ref Range   Magnesium 2.3 1.7 - 2.4 mg/dL    Comment: Performed at [facility] [address] [place], [place] [address]  APTT     Status: Abnormal   Collection Time: 08/26/20  3:32 AM  Result Value Ref Range   aPTT 43 (H) 24 - 36 seconds    Comment:        IF BASELINE aPTT IS ELEVATED, SUGGEST PATIENT RISK ASSESSMENT BE USED TO DETERMINE APPROPRIATE ANTICOAGULANT THERAPY. Performed at [facility], [place] [address], [place], [place] [address]   Protime-INR     Status: Abnormal   Collection Time: 08/26/20  3:32 AM  Result Value Ref Range   Prothrombin Time 30.0 (H) 11.4 - 15.2 seconds   INR 3.0 (H) 0.8 -  1.2    Comment: (NOTE) INR goal varies based on device and disease states. Performed at [facility], [place] [address], [place], [place] [address]   CBC     Status: Abnormal   Collection Time: 08/26/20  3:32 AM  Result Value Ref Range   WBC 3.8 (L) 4.0 - 10.5 K/uL   RBC 2.26 (L) 4.22 - 5.81 MIL/uL   Hemoglobin 7.5 (L) 13.0 - 17.0 g/dL   HCT 23.0 (L) 39.0 - 52.0 %   MCV 101.8 (H) 80.0 - 100.0 fL   MCH 33.2 26.0 - 34.0 pg   MCHC 32.6 30.0 - 36.0 g/dL   RDW 15.3 11.5 - 15.5 %   Platelets 120 (L) 150 - 400 K/uL   nRBC 0.0 0.0 - 0.2 %    Comment: Performed at [facility], [place] [address] [place], [place] [address]    DG Pelvis 1-2 Views  Result Date: 08/25/2020 CLINICAL DATA:  [place], left femoral deformity EXAM: PELVIS - 1-2 VIEW COMPARISON:  10/12/2017 FINDINGS: Single frontal view of the pelvis excludes portions of the proximal right femur and right iliac crest by collimation. Bilateral hip arthroplasties are identified. There are no acute displaced pelvic fractures. Soft tissues are unremarkable. Bowel gas pattern is normal. IMPRESSION: 1. Bilateral hip arthroplasties as above.  No acute pelvic fracture. Electronically Signed   By: Randa Ngo M.D.   On: 08/25/2020 17:30   DG Chest Portable 1 View  Result Date: 08/25/2020 CLINICAL DATA:  Left femur fracture, fell EXAM: PORTABLE CHEST 1 VIEW COMPARISON:  10/09/2017 FINDINGS: Single frontal view of the chest demonstrates marked enlargement the cardiac silhouette, stable. Postsurgical changes from median sternotomy and valvular replacement. Mild chronic central vascular congestion without airspace disease, effusion, or pneumothorax. There are no acute displaced fractures. IMPRESSION: 1. Stable enlarged cardiac silhouette.  No acute process. Electronically Signed   By: Randa Ngo M.D.   On: 08/25/2020 17:32   DG FEMUR MIN 2 VIEWS LEFT  Result Date: 08/25/2020 CLINICAL DATA:  [place], left femur deformity EXAM: LEFT FEMUR 2 VIEWS COMPARISON:  None. FINDINGS: Frontal and cross-table lateral views of the left femur are obtained. Left hip arthroplasty is identified in the expected position. There is an oblique spiral fracture of the distal left femoral diaphysis, with medial displacement and varus angulation at the fracture site. Proximal migration of the distal fracture fragment by approximately 5 cm results in foreshortening of the left leg. There is diffuse soft tissue swelling. IMPRESSION: 1. Acute spiral fracture of the distal left femoral diaphysis, with valgus angulation as well as medial and proximal displacement of the distal fracture fragment. Electronically Signed   By: Randa Ngo M.D.   On: 08/25/2020 17:32    Review of Systems  Constitutional: Negative.   HENT: Negative.   Eyes: Negative.   Respiratory: Negative.   Cardiovascular: Negative.   Gastrointestinal: Negative.   Genitourinary: Negative.   Musculoskeletal: Positive for joint pain.  Skin: Negative.   Neurological: Negative.   Endo/Heme/Allergies: Negative.   Psychiatric/Behavioral: Negative.     Blood pressure 133/79, pulse 73, temperature 97.9 F (36.6 C), resp. rate 18, SpO2 95 %. Physical  Exam Constitutional:      Appearance: He is well-developed.  HENT:     Head: Normocephalic.  Eyes:     Pupils: Pupils are equal, round, and reactive to light.  Neck:     Thyroid: No thyromegaly.     Vascular: No JVD.  Trachea: No tracheal deviation.  Cardiovascular:     Rate and Rhythm: Normal rate and regular rhythm.     Pulses: Intact distal pulses.  Pulmonary:     Effort: Pulmonary effort is normal. No respiratory distress.     Breath sounds: Normal breath sounds. No wheezing.  Abdominal:     Palpations: Abdomen is soft.     Tenderness: There is no abdominal tenderness. There is no guarding.  Musculoskeletal:     Cervical back: Neck supple.     Left upper leg: Swelling (but compartments are soft), tenderness and bony tenderness present.  Lymphadenopathy:     Cervical: No cervical adenopathy.  Skin:    General: Skin is warm and dry.  Neurological:     Mental Status: He is alert and oriented to person, place, and time.  Psychiatric:        Mood and Affect: Mood and affect normal.     Assessment/Plan: Left periprosthetic femur fracture   Plan for ORIF of the left periprosthetic femur fracture  Regular diet now NPO after midnight Dr. Alvan Dame to discuss with partner and or other surgeon to see who might be able to fix the leg earliest Discussed the risks, benefits and expectations were discussed with the patient.  Risks including but not limited to the risk of anesthesia, blood clots, nerve damage, blood vessel damage, failure of the prosthesis, infection and up to and including death.  Patient stated he understood the risks, benefits and expectations and wishes to proceed with surgery. Fraser Din states he has difficulty urinating in the urinal and unable to get up.  Discussed condom cath     [place] 08/26/2020, 7:45 AM

## 2020-08-26 NOTE — TOC CAGE-AID Note (Signed)
Transition of Care [facility]) - CAGE-AID Screening   Patient Details  Name: Richard Spears MRN: [id number] Date of Birth: [birth year]   Clinical Narrative:  Patient endorses alcohol use but denies need for any resources. States he enjoys one cocktail some nights during the week.  CAGE-AID Screening:    Have You Ever Felt You Ought to Cut Down on Your Drinking or Drug Use?: No Have People Annoyed You By Critizing Your Drinking Or Drug Use?: No Have You Felt Bad Or Guilty About Your Drinking Or Drug Use?: No Have You Ever Had a Drink or Used Drugs First Thing In The Morning to Steady Your Nerves or to Get Rid of a Hangover?: No CAGE-AID Score: 0  Substance Abuse Education Offered: No

## 2020-08-27 DIAGNOSIS — N179 Acute kidney failure, unspecified: Secondary | ICD-10-CM

## 2020-08-27 LAB — HEPARIN LEVEL (UNFRACTIONATED)
Heparin Unfractionated: <0.1 IU/mL — ABNORMAL LOW (ref 0.30–0.70)
Heparin Unfractionated: 0.19 IU/mL — ABNORMAL LOW (ref 0.30–0.70)

## 2020-08-27 LAB — BASIC METABOLIC PANEL
Anion gap: 8 (ref 5–15)
BUN: 30 mg/dL — ABNORMAL HIGH (ref 8–23)
CO2: 25 mmol/L (ref 22–32)
Calcium: 8.4 mg/dL — ABNORMAL LOW (ref 8.9–10.3)
Chloride: 103 mmol/L (ref 98–111)
Creatinine, Ser: 1.36 mg/dL — ABNORMAL HIGH (ref 0.61–1.24)
GFR, Estimated: 49 mL/min — ABNORMAL LOW (ref >60)
Glucose, Bld: 101 mg/dL — ABNORMAL HIGH (ref 70–99)
Potassium: 3.9 mmol/L (ref 3.5–5.1)
Sodium: 136 mmol/L (ref 135–145)

## 2020-08-27 LAB — CBC WITH DIFFERENTIAL/PLATELET
Abs Immature Granulocytes: 0.03 10*3/uL (ref 0.00–0.07)
Basophils Absolute: 0 10*3/uL (ref 0.0–0.1)
Basophils Relative: 1 %
Eosinophils Absolute: 0.1 10*3/uL (ref 0.0–0.5)
Eosinophils Relative: 3 %
HCT: 24.8 % — ABNORMAL LOW (ref 39.0–52.0)
Hemoglobin: 8.4 g/dL — ABNORMAL LOW (ref 13.0–17.0)
Immature Granulocytes: 1 %
Lymphocytes Relative: 8 %
Lymphs Abs: 0.3 10*3/uL — ABNORMAL LOW (ref 0.7–4.0)
MCH: 31.8 pg (ref 26.0–34.0)
MCHC: 33.9 g/dL (ref 30.0–36.0)
MCV: 93.9 fL (ref 80.0–100.0)
Monocytes Absolute: 0.6 10*3/uL (ref 0.1–1.0)
Monocytes Relative: 15 %
Neutro Abs: 2.9 10*3/uL (ref 1.7–7.7)
Neutrophils Relative %: 72 %
Platelets: 119 10*3/uL — ABNORMAL LOW (ref 150–400)
RBC: 2.64 MIL/uL — ABNORMAL LOW (ref 4.22–5.81)
RDW: 18.6 % — ABNORMAL HIGH (ref 11.5–15.5)
WBC: 3.9 10*3/uL — ABNORMAL LOW (ref 4.0–10.5)
nRBC: 0 % (ref 0.0–0.2)

## 2020-08-27 LAB — HEMOGLOBIN AND HEMATOCRIT, BLOOD
HCT: 24.8 % — ABNORMAL LOW (ref 39.0–52.0)
Hemoglobin: 8.4 g/dL — ABNORMAL LOW (ref 13.0–17.0)

## 2020-08-27 LAB — PROTIME-INR
INR: 2.2 — ABNORMAL HIGH (ref 0.8–1.2)
Prothrombin Time: 23.5 seconds — ABNORMAL HIGH (ref 11.4–15.2)

## 2020-08-27 LAB — PREPARE RBC (CROSSMATCH)

## 2020-08-27 LAB — MAGNESIUM: Magnesium: 2.2 mg/dL (ref 1.7–2.4)

## 2020-08-27 MED ORDER — SODIUM CHLORIDE 0.9% IV SOLUTION: Freq: Once | INTRAVENOUS | Status: AC

## 2020-08-27 MED ORDER — HEPARIN (PORCINE) 25000 UT/250ML-% IV SOLN
1300.0000 [IU]/h | INTRAVENOUS | Status: DC
  Administered 2020-08-27: 900 [IU]/h via INTRAVENOUS
  Filled 2020-08-27: qty 250

## 2020-08-27 NOTE — Progress Notes (Signed)
PROGRESS NOTE    Richard Spears   XBD:[id number]  DOB: 1926/06/01  DOA: 08/25/2020     1  PCP: [name], MD  CC: fall at home  Hospital Course: Mr. Lembke is a 87 yo male with PMH PAF, mechanical MVR (1997, on Coumadin), BPH, OA, previous R/L hip arthroplasties, hypertension who presented to the ER with severe left thigh pain after a fall at home.  Fall was considered mechanical in nature.    Imaging revealed a significant acute spiral fracture of the distal left femoral diaphysis with displacement.  Traction was initiated and he was admitted for further work-up and surgical evaluation.  Coumadin was placed on hold and he was transitioned to a heparin drip when INR fell below 2.5. He also developed a downtrend in hemoglobin and was transfused 2 units PRBC on 08/26/2020.   Interval History:  No events overnight. Pain and spasms better controlled today. Tolerating traction well still.  No bleeding noted; started on heparin drip early this am.   ROS: Constitutional: negative for chills and fevers, Respiratory: negative for cough, Cardiovascular: negative for chest pain and Gastrointestinal: negative for abdominal pain  Assessment & Plan: * Closed displaced fracture of distal epiphysis of left femur (HCC) - s/p mechanical fall at home - holding coumadin in anticipation of surgery; s/p Vit K per ortho on 3/1 - continue heparin drip; to be held prior to surgery per ortho rec's - continue pain control  -Tentative plan is for surgical repair on Thursday - continue LLE neurovascular checks   S/P MVR (mitral valve replacement) - goal INR 2.5 - 3.5 - holding warf for surgery - start heparin drip when INR<2.5, started on 3/1 - will need bridging treatment back after surgery  Macrocytic anemia - iron 60, sat ratio 15%, TIBC ULN (413) - folate 44, B12 478 - continue MVI - Hgb down to 7.6 g/dL on 3/1; no s/s bleeding or compartment syndrome; ortho transfusing 2 units  PRBC - repeat Hgb today 8.4 g/dL, continue trending   Alcohol use - no s/s withdrawal - d/c CIWA  Essential hypertension -Continue bisoprolol, Lasix, hydralazine, Imdur  Longstanding persistent atrial fibrillation: CHA2DS2-VASc Score 3 - on Coumadin chronically -Continue bisoprolol  AKI (acute kidney injury) (HCC)-resolved as of 08/27/2020 - baseline creatinine ~ 1.1 - patient presents with increase in creat >0.3 mg/dL above baseline presumed to have occurred within past 7 days PTA - continue IVF - BMP in am    Old records reviewed in assessment of this patient  Antimicrobials: n/a  DVT prophylaxis: SCDs Start: 08/25/20 2054   Code Status:   Code Status: Full Code Family Communication: none present  Disposition Plan: Status is: Observation  The patient will require care spanning > 2 midnights and should be moved to inpatient because: Ongoing active pain requiring inpatient pain management, IV treatments appropriate due to intensity of illness or inability to take PO and Inpatient level of care appropriate due to severity of illness  Dispo: The patient is from: Home              Anticipated d/c is to: pending PT eval after surgery              Patient currently is not medically stable to d/c.   Difficult to place patient No   Risk of unplanned readmission score: Unplanned Admission- Pilot do not use: 19.36   Objective: Blood pressure (!) 98/43, pulse 98, temperature 97.6 F (36.4 C), temperature source Oral, resp. rate  17, SpO2 94 %.  Examination: General appearance: alert, cooperative and no distress Head: Normocephalic, without obvious abnormality, atraumatic Eyes: EOMI Lungs: clear to auscultation bilaterally Heart: irregularly irregular rhythm and S1, S2 normal Abdomen: normal findings: bowel sounds normal and soft, non-tender Extremities: LLE noted in traction sling; no obvious leg/thigh swelling; appropriately TTP Skin: mobility and turgor  normal Neurologic: LLE deferred but sensation intact in LLE and no other obvious focal deficits   Consultants:   Ortho surgery  Procedures:     Data Reviewed: I have personally reviewed following labs and imaging studies Results for orders placed or performed during the hospital encounter of 08/25/20 (from the past 24 hour(s))  CBC     Status: Abnormal   Collection Time: 08/26/20  3:01 PM  Result Value Ref Range   WBC 3.6 (L) 4.0 - 10.5 K/uL   RBC 2.05 (L) 4.22 - 5.81 MIL/uL   Hemoglobin 6.9 (LL) 13.0 - 17.0 g/dL   HCT 20.8 (L) 39.0 - 52.0 %   MCV 101.5 (H) 80.0 - 100.0 fL   MCH 33.7 26.0 - 34.0 pg   MCHC 33.2 30.0 - 36.0 g/dL   RDW 15.8 (H) 11.5 - 15.5 %   Platelets 122 (L) 150 - 400 K/uL   nRBC 0.0 0.0 - 0.2 %  Surgical pcr screen     Status: None   Collection Time: 08/26/20  5:49 PM   Specimen: Nasal Mucosa; Nasal Swab  Result Value Ref Range   MRSA, PCR NEGATIVE NEGATIVE   Staphylococcus aureus NEGATIVE NEGATIVE  Protime-INR     Status: Abnormal   Collection Time: 08/27/20  4:26 AM  Result Value Ref Range   Prothrombin Time 23.5 (H) 11.4 - 15.2 seconds   INR 2.2 (H) 0.8 - 1.2  Basic metabolic panel     Status: Abnormal   Collection Time: 08/27/20  4:26 AM  Result Value Ref Range   Sodium 136 135 - 145 mmol/L   Potassium 3.9 3.5 - 5.1 mmol/L   Chloride 103 98 - 111 mmol/L   CO2 25 22 - 32 mmol/L   Glucose, Bld 101 (H) 70 - 99 mg/dL   BUN 30 (H) 8 - 23 mg/dL   Creatinine, Ser 1.36 (H) 0.61 - 1.24 mg/dL   Calcium 8.4 (L) 8.9 - 10.3 mg/dL   GFR, Estimated 49 (L) >60 mL/min   Anion gap 8 5 - 15  CBC with Differential/Platelet     Status: Abnormal   Collection Time: 08/27/20  4:26 AM  Result Value Ref Range   WBC 3.9 (L) 4.0 - 10.5 K/uL   RBC 2.64 (L) 4.22 - 5.81 MIL/uL   Hemoglobin 8.4 (L) 13.0 - 17.0 g/dL   HCT 24.8 (L) 39.0 - 52.0 %   MCV 93.9 80.0 - 100.0 fL   MCH 31.8 26.0 - 34.0 pg   MCHC 33.9 30.0 - 36.0 g/dL   RDW 18.6 (H) 11.5 - 15.5 %   Platelets  119 (L) 150 - 400 K/uL   nRBC 0.0 0.0 - 0.2 %   Neutrophils Relative % 72 %   Neutro Abs 2.9 1.7 - 7.7 K/uL   Lymphocytes Relative 8 %   Lymphs Abs 0.3 (L) 0.7 - 4.0 K/uL   Monocytes Relative 15 %   Monocytes Absolute 0.6 0.1 - 1.0 K/uL   Eosinophils Relative 3 %   Eosinophils Absolute 0.1 0.0 - 0.5 K/uL   Basophils Relative 1 %   Basophils Absolute 0.0 0.0 - 0.1  K/uL   Smear Review MORPHOLOGY UNREMARKABLE    Immature Granulocytes 1 %   Abs Immature Granulocytes 0.03 0.00 - 0.07 K/uL  Magnesium     Status: None   Collection Time: 08/27/20  4:26 AM  Result Value Ref Range   Magnesium 2.2 1.7 - 2.4 mg/dL    Recent Results (from the past 240 hour(s))  Resp Panel by RT-PCR (Flu A&B, Covid) Nasopharyngeal Swab     Status: None   Collection Time: 08/25/20  6:32 PM   Specimen: Nasopharyngeal Swab; Nasopharyngeal(NP) swabs in vial transport medium  Result Value Ref Range Status   SARS Coronavirus 2 by RT PCR NEGATIVE NEGATIVE Final    Comment: (NOTE) SARS-CoV-2 target nucleic acids are NOT DETECTED.  The SARS-CoV-2 RNA is generally detectable in upper respiratory specimens during the acute phase of infection. The lowest concentration of SARS-CoV-2 viral copies this assay can detect is 138 copies/mL. A negative result does not preclude SARS-Cov-2 infection and should not be used as the sole basis for treatment or other patient management decisions. A negative result may occur with  improper specimen collection/handling, submission of specimen other than nasopharyngeal swab, presence of viral mutation(s) within the areas targeted by this assay, and inadequate number of viral copies(<138 copies/mL). A negative result must be combined with clinical observations, patient history, and epidemiological information. The expected result is Negative.  Fact Sheet for Patients:  EntrepreneurPulse.com.au  Fact Sheet for Healthcare Providers:   IncredibleEmployment.be  This test is no t yet approved or cleared by the [place] FDA and  has been authorized for detection and/or diagnosis of SARS-CoV-2 by FDA under an Emergency Use Authorization (EUA). This EUA will remain  in effect (meaning this test can be used) for the duration of the COVID-19 declaration under Section 564(b)(1) of the Act, 21 U.S.C.section 360bbb-3(b)(1), unless the authorization is terminated  or revoked sooner.       Influenza A by PCR NEGATIVE NEGATIVE Final   Influenza B by PCR NEGATIVE NEGATIVE Final    Comment: (NOTE) The Xpert Xpress SARS-CoV-2/FLU/RSV plus assay is intended as an aid in the diagnosis of influenza from Nasopharyngeal swab specimens and should not be used as a sole basis for treatment. Nasal washings and aspirates are unacceptable for Xpert Xpress SARS-CoV-2/FLU/RSV testing.  Fact Sheet for Patients: EntrepreneurPulse.com.au  Fact Sheet for Healthcare Providers: IncredibleEmployment.be  This test is not yet approved or cleared by the [place] FDA and has been authorized for detection and/or diagnosis of SARS-CoV-2 by FDA under an Emergency Use Authorization (EUA). This EUA will remain in effect (meaning this test can be used) for the duration of the COVID-19 declaration under Section 564(b)(1) of the Act, 21 U.S.C. section 360bbb-3(b)(1), unless the authorization is terminated or revoked.  Performed at [facility], [place] [address] [place], [place] [address]   Surgical pcr screen     Status: None   Collection Time: 08/26/20  5:49 PM   Specimen: Nasal Mucosa; Nasal Swab  Result Value Ref Range Status   MRSA, PCR NEGATIVE NEGATIVE Final   Staphylococcus aureus NEGATIVE NEGATIVE Final    Comment: (NOTE) The Xpert SA Assay (FDA approved for NASAL specimens in patients 74 years of age and older), is one component of a comprehensive surveillance program.  It is not intended to diagnose infection nor to guide or monitor treatment. Performed at [facility], [place] [address] [place], [place] [address]      Radiology Studies: DG Pelvis  1-2 Views  Result Date: 08/25/2020 CLINICAL DATA:  [place], left femoral deformity EXAM: PELVIS - 1-2 VIEW COMPARISON:  10/12/2017 FINDINGS: Single frontal view of the pelvis excludes portions of the proximal right femur and right iliac crest by collimation. Bilateral hip arthroplasties are identified. There are no acute displaced pelvic fractures. Soft tissues are unremarkable. Bowel gas pattern is normal. IMPRESSION: 1. Bilateral hip arthroplasties as above.  No acute pelvic fracture. Electronically Signed   By: Randa Ngo M.D.   On: 08/25/2020 17:30   DG Chest Portable 1 View  Result Date: 08/25/2020 CLINICAL DATA:  Left femur fracture, fell EXAM: PORTABLE CHEST 1 VIEW COMPARISON:  10/09/2017 FINDINGS: Single frontal view of the chest demonstrates marked enlargement the cardiac silhouette, stable. Postsurgical changes from median sternotomy and valvular replacement. Mild chronic central vascular congestion without airspace disease, effusion, or pneumothorax. There are no acute displaced fractures. IMPRESSION: 1. Stable enlarged cardiac silhouette.  No acute process. Electronically Signed   By: Randa Ngo M.D.   On: 08/25/2020 17:32   DG FEMUR MIN 2 VIEWS LEFT  Result Date: 08/25/2020 CLINICAL DATA:  [place], left femur deformity EXAM: LEFT FEMUR 2 VIEWS COMPARISON:  None. FINDINGS: Frontal and cross-table lateral views of the left femur are obtained. Left hip arthroplasty is identified in the expected position. There is an oblique spiral fracture of the distal left femoral diaphysis, with medial displacement and varus angulation at the fracture site. Proximal migration of the distal fracture fragment by approximately 5 cm results in foreshortening of the left leg. There is diffuse soft tissue swelling.  IMPRESSION: 1. Acute spiral fracture of the distal left femoral diaphysis, with valgus angulation as well as medial and proximal displacement of the distal fracture fragment. Electronically Signed   By: Randa Ngo M.D.   On: 08/25/2020 17:32   DG Pelvis 1-2 Views  Final Result    DG FEMUR MIN 2 VIEWS LEFT  Final Result    DG Chest Portable 1 View  Final Result      Scheduled Meds: . sodium chloride   Intravenous Once  . bisoprolol  5 mg Oral Daily  . cholecalciferol  1,000 Units Oral Daily  . folic acid  1 mg Oral Daily  . furosemide  40 mg Oral Daily  . hydrALAZINE  25 mg Oral Q lunch  . isosorbide mononitrate  30 mg Oral Daily  . multivitamin with minerals  1 tablet Oral Daily  . thiamine  100 mg Oral Daily   Or  . thiamine  100 mg Intravenous Daily   PRN Meds: acetaminophen **OR** acetaminophen, fluticasone, methocarbamol, morphine injection **OR** morphine injection, ondansetron **OR** ondansetron (ZOFRAN) IV, polyethylene glycol Continuous Infusions: . sodium chloride 75 mL/hr at 08/27/20 1127  . heparin 900 Units/hr (08/27/20 1127)     LOS: 1 day  Time spent: Greater than 50% of the 35 minute visit was spent in counseling/coordination of care for the patient as laid out in the A&P.   Dwyane Dee, MD [facility] 08/27/2020, 2:37 PM

## 2020-08-27 NOTE — Assessment & Plan Note (Addendum)
-   iron 60, sat ratio 15%, TIBC ULN (413) - folate 44, B12 478 - continue MVI - Hgb down to 7.6 g/dL on 3/1; no s/s bleeding or compartment syndrome; ortho transfusing 2 units PRBC - repeat Hgb 3/3, 8.4 g/dL on 3/2, transfused 2 more units per ortho - ~1500 cc EBL in surgery, s/p 3 units PRBC - Hgb this am (3/7) is 10 g/dL; vitals are stable; hemovac in place - CBC daily  -Hemoglobin has been very slowly downtrending.  Hemovac now removed and suspect should be stabilizing.  Will need repeat CBC in 2 to 3 days

## 2020-08-27 NOTE — Plan of Care (Signed)

## 2020-08-27 NOTE — Assessment & Plan Note (Addendum)
-   goal INR 2.5 - 3.5 - coumadin resumed 3/3 post-op; on heparin for bridging  - continue heparin and coumadin per pharmacy  - INR up to 2.3 today; if INR within goal range on 3/8, would be stable to d/c to SNF

## 2020-08-27 NOTE — Progress Notes (Signed)
ANTICOAGULATION CONSULT NOTE - Initial Consult  Pharmacy Consult for Heparin when INR is <2.5 Indication: Afib, St Jude MVR  Allergies  Allergen Reactions  . Flexeril [Cyclobenzaprine] Diarrhea    Vital Signs: Temp: 99.6 F (37.6 C) (03/02 0228) Temp Source: Oral (03/02 0228) BP: 131/68 (03/02 0228) Pulse Rate: 88 (03/02 0228)  Labs: Recent Labs    08/25/20 1832 08/26/20 0332 08/26/20 0915 08/26/20 1501 08/27/20 0426  HGB 8.8* 7.5* 7.6* 6.9*  --   HCT 27.7* 23.0* 23.4* 20.8*  --   PLT 136* 120* 130* 122*  --   APTT  --  43*  --   --   --   LABPROT 27.7* 30.0*  --   --  23.5*  INR 2.7* 3.0*  --   --  2.2*  CREATININE 1.53* 1.37*  --   --  1.36*    CrCl cannot be calculated (Unknown ideal weight.).   Medical History: Past Medical History:  Diagnosis Date  . Anemia    leakoppenia  . BPH (benign prostatic hypertrophy)   . Bullous pemphigoid    [name], March 2011, right forearm squamous cell carcinoma  . Chronic anticoagulation    systemic  . Colon polyp    transverse, 2002  . History of peptic ulcer    remote, 3/95  . Hx of actinic keratosis   . Hx of basal cell carcinoma   . Hx of squamous cell carcinoma of skin   . Hyperlipidemia   . Left inguinal hernia   . Moderate aortic insufficiency 2009   audible aortic insufficiency on 1/09 echo  . PAF (paroxysmal atrial fibrillation) ([place]) 01/17/2014   On Warfarin.  . S/P mitral valve replacement with metallic valve 94/5038   INR goal 2.5-3.5, St Jude,   . 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   Goal of Therapy:  Heparin level 0.3-0.7 units/ml Monitor platelets by anticoagulation protocol: Yes   Plan:  No bolus Start heparin drip at 900 units/hr 1500 heparin level  Daily CBC/heparin level Watch Hgb  closely   Narda Bonds, PharmD, BCPS Clinical Pharmacist Phone: [phone]

## 2020-08-27 NOTE — Assessment & Plan Note (Signed)
-   on Coumadin chronically -Continue bisoprolol

## 2020-08-27 NOTE — Progress Notes (Addendum)
ANTICOAGULATION CONSULT NOTE - Follow Up Consult  Pharmacy Consult for Heparin When INR is <2.5 Indication: Atrial Fibrillation, St Jude mech MVR  Allergies  Allergen Reactions  . Flexeril [Cyclobenzaprine] Diarrhea   Total Body Weight: Height:  72 inches Heparin Dosing Weight:   Vital Signs: Temp: 97.6 F (36.4 C) (03/02 1312) Temp Source: Oral (03/02 1312) BP: 98/43 (03/02 1312) Pulse Rate: 98 (03/02 1312)  Labs: Recent Labs    08/25/20 1832 08/26/20 0332 08/26/20 0915 08/26/20 1501 08/27/20 0426 08/27/20 1416  HGB 8.8* 7.5* 7.6* 6.9* 8.4* 8.4*  HCT 27.7* 23.0* 23.4* 20.8* 24.8* 24.8*  PLT 136* 120* 130* 122* 119*  --   APTT  --  43*  --   --   --   --   LABPROT 27.7* 30.0*  --   --  23.5*  --   INR 2.7* 3.0*  --   --  2.2*  --   HEPARINUNFRC  --   --   --   --   --  <0.10*  CREATININE 1.53* 1.37*  --   --  1.36*  --     Estimated Creatinine Clearance: 33.7 mL/min (A) (by C-G formula based on SCr of 1.36 mg/dL (H)).   Medical History: Past Medical History:  Diagnosis Date  . Anemia    leakoppenia  . BPH (benign prostatic hypertrophy)   . Bullous pemphigoid    [name], March 2011, right forearm squamous cell carcinoma  . Chronic anticoagulation    systemic  . Colon polyp    transverse, 2002  . History of peptic ulcer    remote, 3/95  . Hx of actinic keratosis   . Hx of basal cell carcinoma   . Hx of squamous cell carcinoma of skin   . Hyperlipidemia   . Left inguinal hernia   . Moderate aortic insufficiency 2009   audible aortic insufficiency on 1/09 echo  . PAF (paroxysmal atrial fibrillation) ([place]) 01/17/2014   On Warfarin.  . S/P mitral valve replacement with metallic valve 69/4854   INR goal 2.5-3.5, St Jude,   . 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 Warfarin is on  hold in anticipation of surgery for hip fracture, received 5mg  PO vitamin K on 3/1. Pharmacy was consulted to start IV heparin when INR <2.5, INR was 2.2 on 3/2 am.    Initial heparin level ~5 hrs after starting heparin infusion at 900 units/hr was <0.10 units/ml (undetectable). H/H and plt stable. Unable to weigh pt due to immobility, pt states he's 155 lbs. Patient has hematoma on LUE from fall, not getting larger per RN.   Goal of Therapy:  Heparin level 0.3-0.7 units/ml Monitor platelets by anticoagulation protocol: Yes   Plan:  Increase heparin infusion to 1150 units/hr Check heparin level in 8 hrs Monitor daily heparin level, CBC (monitor Hgb closely), s/sx bleeding  F/u warfarin plan   Benetta Spar, PharmD, BCPS, [facility] Clinical Pharmacist  Please check AMION for all [place] phone numbers After 10:00 PM, call [place]

## 2020-08-27 NOTE — Assessment & Plan Note (Addendum)
-   baseline creatinine ~ 1.1 - patient presents with increase in creat >0.3 mg/dL above baseline presumed to have occurred within past 7 days PTA - s/p IVF

## 2020-08-27 NOTE — Assessment & Plan Note (Addendum)
-   s/p mechanical fall at home - s/p ORIF on 08/28/20 - post-op rec's per ortho: touchdown weightbearing with walker; unlimited ROM of L knee - SNF pending - mentation remains stable; no delirium noted

## 2020-08-27 NOTE — Progress Notes (Signed)
ANTICOAGULATION CONSULT NOTE - Follow Up Consult  Pharmacy Consult for Heparin When INR is <2.5 Indication: Atrial Fibrillation, St Jude mech MVR  Allergies  Allergen Reactions  . Flexeril [Cyclobenzaprine] Diarrhea   Total Body Weight: Height:  72 inches Heparin Dosing Weight:   Vital Signs: Temp: 99.8 F (37.7 C) (03/02 2029) Temp Source: Oral (03/02 2029) BP: 110/50 (03/02 2029) Pulse Rate: 68 (03/02 2029)  Labs: Recent Labs    08/25/20 1832 08/26/20 0332 08/26/20 0915 08/26/20 1501 08/27/20 0426 08/27/20 1416 08/27/20 2237  HGB 8.8* 7.5* 7.6* 6.9* 8.4* 8.4*  --   HCT 27.7* 23.0* 23.4* 20.8* 24.8* 24.8*  --   PLT 136* 120* 130* 122* 119*  --   --   APTT  --  43*  --   --   --   --   --   LABPROT 27.7* 30.0*  --   --  23.5*  --   --   INR 2.7* 3.0*  --   --  2.2*  --   --   HEPARINUNFRC  --   --   --   --   --  <0.10* 0.19*  CREATININE 1.53* 1.37*  --   --  1.36*  --   --     Estimated Creatinine Clearance: 33.7 mL/min (A) (by C-G formula based on SCr of 1.36 mg/dL (H)).  Assessment: 87 yr old man was on warfarin PTA for atrial fibrillation and St Jude MVR (INR goal 2.5-3.5) was admitted after a fall at home. Warfarin is on hold in anticipation of surgery for hip fracture, received 5mg  PO vitamin K on 3/1. Pharmacy was consulted to start IV heparin when INR <2.5, INR was 2.2 on 3/2 am.    Patient has hematoma on LUE from fall, not getting larger per RN.   Heparin level subtherapeutic (0.19) on gtt at 1150 units/hr. No issues with line or bleeding reported per RN. Hgb 8.4 after PRBC x 2, MD ordering additional 2 units.   Goal of Therapy:  Heparin level 0.3-0.7 units/ml Monitor platelets by anticoagulation protocol: Yes   Plan:  Increase heparin infusion to 1300 units/hr Heparin to be turned off at 0600 for OR  Sherlon Handing, PharmD, BCPS Please see amion for complete clinical pharmacist phone list 08/27/2020 11:17 PM

## 2020-08-27 NOTE — Assessment & Plan Note (Signed)
-  Continue bisoprolol, Lasix, hydralazine, Imdur

## 2020-08-27 NOTE — Hospital Course (Signed)
Mr. Strohmeier is a 87 yo male with PMH PAF, mechanical MVR (1997, on Coumadin), BPH, OA, previous R/L hip arthroplasties, hypertension who presented to the ER with severe left thigh pain after a fall at home.  Fall was considered mechanical in nature.    Imaging revealed a significant acute spiral fracture of the distal left femoral diaphysis with displacement.  Traction was initiated and he was admitted for further work-up and surgical evaluation.  Coumadin was placed on hold and he was transitioned to a heparin drip when INR fell below 2.5. He also developed a downtrend in hemoglobin and was transfused 2 units PRBC on 08/26/2020. Hemoglobin improved to 8.4 g/dL after transfusion and he was given 2 more units per orthopedic surgery.  Follow-up hemoglobin the following morning was 10.6 g/dL. He underwent surgery on 08/28/2020.  He received 3 additional units PRBC during surgery for intraoperative blood loss.  A Hemovac was also placed. Coumadin was resumed on 08/28/2020 followed by heparin drip 24 hours postop.

## 2020-08-27 NOTE — Assessment & Plan Note (Signed)
-   no s/s withdrawal - d/c CIWA

## 2020-08-27 NOTE — Progress Notes (Signed)
Hgb 8.4 today s/p 2 units PRBCs. Will order additional 2 units PRBCs. NPO after MN tonight. D/C heparin gtt at 0600 tomorrow am.

## 2020-08-28 ENCOUNTER — Inpatient Hospital Stay (HOSPITAL_COMMUNITY): Payer: Medicare Other | Admitting: Certified Registered Nurse Anesthetist

## 2020-08-28 ENCOUNTER — Encounter (HOSPITAL_COMMUNITY)
Admission: EM | Disposition: A | Discharge status: Skilled Nursing Facility | Payer: Self-pay | Source: Home / Self Care | Attending: Internal Medicine

## 2020-08-28 ENCOUNTER — Inpatient Hospital Stay (HOSPITAL_COMMUNITY): Payer: Medicare Other

## 2020-08-28 ENCOUNTER — Encounter (HOSPITAL_COMMUNITY): Payer: Self-pay | Admitting: Internal Medicine

## 2020-08-28 HISTORY — PX: ORIF FEMUR FRACTURE: SHX2119

## 2020-08-28 LAB — CBC WITH DIFFERENTIAL/PLATELET
Abs Immature Granulocytes: 0.05 10*3/uL (ref 0.00–0.07)
Basophils Absolute: 0 10*3/uL (ref 0.0–0.1)
Basophils Relative: 1 %
Eosinophils Absolute: 0.2 10*3/uL (ref 0.0–0.5)
Eosinophils Relative: 3 %
HCT: 31.6 % — ABNORMAL LOW (ref 39.0–52.0)
Hemoglobin: 10.6 g/dL — ABNORMAL LOW (ref 13.0–17.0)
Immature Granulocytes: 1 %
Lymphocytes Relative: 8 %
Lymphs Abs: 0.4 10*3/uL — ABNORMAL LOW (ref 0.7–4.0)
MCH: 31.5 pg (ref 26.0–34.0)
MCHC: 33.5 g/dL (ref 30.0–36.0)
MCV: 94 fL (ref 80.0–100.0)
Monocytes Absolute: 0.6 10*3/uL (ref 0.1–1.0)
Monocytes Relative: 13 %
Neutro Abs: 3.3 10*3/uL (ref 1.7–7.7)
Neutrophils Relative %: 74 %
Platelets: 115 10*3/uL — ABNORMAL LOW (ref 150–400)
RBC: 3.36 MIL/uL — ABNORMAL LOW (ref 4.22–5.81)
RDW: 17.2 % — ABNORMAL HIGH (ref 11.5–15.5)
WBC: 4.4 10*3/uL (ref 4.0–10.5)
nRBC: 0.5 % — ABNORMAL HIGH (ref 0.0–0.2)

## 2020-08-28 LAB — POCT I-STAT EG7
Acid-Base Excess: 1 mmol/L (ref 0.0–2.0)
Bicarbonate: 28 mmol/L (ref 20.0–28.0)
Calcium, Ion: 1.11 mmol/L — ABNORMAL LOW (ref 1.15–1.40)
HCT: 35 % — ABNORMAL LOW (ref 39.0–52.0)
Hemoglobin: 11.9 g/dL — ABNORMAL LOW (ref 13.0–17.0)
O2 Saturation: 40 %
Potassium: 4.1 mmol/L (ref 3.5–5.1)
Sodium: 139 mmol/L (ref 135–145)
TCO2: 30 mmol/L (ref 22–32)
pCO2, Ven: 53.4 mmHg (ref 44.0–60.0)
pH, Ven: 7.328 (ref 7.250–7.430)
pO2, Ven: 25 mmHg — CL (ref 32.0–45.0)

## 2020-08-28 LAB — BASIC METABOLIC PANEL
Anion gap: 10 (ref 5–15)
BUN: 26 mg/dL — ABNORMAL HIGH (ref 8–23)
CO2: 23 mmol/L (ref 22–32)
Calcium: 8.4 mg/dL — ABNORMAL LOW (ref 8.9–10.3)
Chloride: 104 mmol/L (ref 98–111)
Creatinine, Ser: 1.31 mg/dL — ABNORMAL HIGH (ref 0.61–1.24)
GFR, Estimated: 51 mL/min — ABNORMAL LOW (ref >60)
Glucose, Bld: 103 mg/dL — ABNORMAL HIGH (ref 70–99)
Potassium: 3.7 mmol/L (ref 3.5–5.1)
Sodium: 137 mmol/L (ref 135–145)

## 2020-08-28 LAB — PROTIME-INR
INR: 1.4 — ABNORMAL HIGH (ref 0.8–1.2)
Prothrombin Time: 16.5 seconds — ABNORMAL HIGH (ref 11.4–15.2)

## 2020-08-28 LAB — PREPARE RBC (CROSSMATCH)

## 2020-08-28 LAB — MAGNESIUM: Magnesium: 2.1 mg/dL (ref 1.7–2.4)

## 2020-08-28 SURGERY — OPEN REDUCTION INTERNAL FIXATION (ORIF) DISTAL FEMUR FRACTURE
Anesthesia: General | Site: Leg Upper | Laterality: Left

## 2020-08-28 MED ORDER — LACTATED RINGERS IV SOLN: INTRAVENOUS | Status: DC

## 2020-08-28 MED ORDER — DEXAMETHASONE SODIUM PHOSPHATE 10 MG/ML IJ SOLN
INTRAMUSCULAR | Status: AC
  Filled 2020-08-28: qty 1

## 2020-08-28 MED ORDER — CHLORHEXIDINE GLUCONATE 4 % EX LIQD: 60.0000 mL | Freq: Once | CUTANEOUS | Status: DC

## 2020-08-28 MED ORDER — HYDROCODONE-ACETAMINOPHEN 7.5-325 MG PO TABS: 1.0000 | ORAL_TABLET | ORAL | Status: DC | PRN

## 2020-08-28 MED ORDER — METOCLOPRAMIDE HCL 5 MG/ML IJ SOLN: 5.0000 mg | Freq: Three times a day (TID) | INTRAMUSCULAR | Status: DC | PRN

## 2020-08-28 MED ORDER — SODIUM CHLORIDE 0.9% IV SOLUTION: Freq: Once | INTRAVENOUS | Status: DC

## 2020-08-28 MED ORDER — ONDANSETRON HCL 4 MG PO TABS: 4.0000 mg | ORAL_TABLET | Freq: Four times a day (QID) | ORAL | Status: DC | PRN

## 2020-08-28 MED ORDER — DEXAMETHASONE SODIUM PHOSPHATE 10 MG/ML IJ SOLN
INTRAMUSCULAR | Status: DC | PRN
  Administered 2020-08-28: 4 mg via INTRAVENOUS

## 2020-08-28 MED ORDER — OXYCODONE HCL 5 MG PO TABS: 5.0000 mg | ORAL_TABLET | Freq: Once | ORAL | Status: DC | PRN

## 2020-08-28 MED ORDER — MENTHOL 3 MG MT LOZG: 1.0000 | LOZENGE | OROMUCOSAL | Status: DC | PRN

## 2020-08-28 MED ORDER — CEFAZOLIN SODIUM-DEXTROSE 2-4 GM/100ML-% IV SOLN
2.0000 g | Freq: Four times a day (QID) | INTRAVENOUS | Status: AC
  Administered 2020-08-28 – 2020-08-29 (×2): 2 g via INTRAVENOUS
  Filled 2020-08-28 (×2): qty 100

## 2020-08-28 MED ORDER — ONDANSETRON HCL 4 MG/2ML IJ SOLN: 4.0000 mg | Freq: Once | INTRAMUSCULAR | Status: DC | PRN

## 2020-08-28 MED ORDER — MORPHINE SULFATE (PF) 2 MG/ML IV SOLN: 0.5000 mg | INTRAVENOUS | Status: DC | PRN

## 2020-08-28 MED ORDER — CEFAZOLIN SODIUM-DEXTROSE 2-4 GM/100ML-% IV SOLN
2.0000 g | INTRAVENOUS | Status: AC
  Administered 2020-08-28: 2 g via INTRAVENOUS
  Filled 2020-08-28: qty 100

## 2020-08-28 MED ORDER — ALBUMIN HUMAN 5 % IV SOLN: INTRAVENOUS | Status: DC | PRN

## 2020-08-28 MED ORDER — SENNA 8.6 MG PO TABS
1.0000 | ORAL_TABLET | Freq: Two times a day (BID) | ORAL | Status: DC
  Administered 2020-08-28 – 2020-08-29 (×2): 8.6 mg via ORAL
  Filled 2020-08-28 (×2): qty 1

## 2020-08-28 MED ORDER — DOCUSATE SODIUM 100 MG PO CAPS
100.0000 mg | ORAL_CAPSULE | Freq: Two times a day (BID) | ORAL | Status: DC
  Administered 2020-08-28 – 2020-09-02 (×8): 100 mg via ORAL
  Filled 2020-08-28 (×9): qty 1

## 2020-08-28 MED ORDER — METOCLOPRAMIDE HCL 5 MG PO TABS: 5.0000 mg | ORAL_TABLET | Freq: Three times a day (TID) | ORAL | Status: DC | PRN

## 2020-08-28 MED ORDER — FENTANYL CITRATE (PF) 100 MCG/2ML IJ SOLN
INTRAMUSCULAR | Status: AC
  Administered 2020-08-28: 25 ug via INTRAVENOUS
  Filled 2020-08-28: qty 2

## 2020-08-28 MED ORDER — ONDANSETRON HCL 4 MG/2ML IJ SOLN
INTRAMUSCULAR | Status: DC | PRN
  Administered 2020-08-28: 4 mg via INTRAVENOUS

## 2020-08-28 MED ORDER — HYDROCODONE-ACETAMINOPHEN 5-325 MG PO TABS
1.0000 | ORAL_TABLET | ORAL | Status: DC | PRN
Start: 2020-08-28 — End: 2020-09-02
  Administered 2020-08-29: 1 via ORAL
  Filled 2020-08-28: qty 1

## 2020-08-28 MED ORDER — FENTANYL CITRATE (PF) 100 MCG/2ML IJ SOLN
25.0000 ug | INTRAMUSCULAR | Status: DC | PRN
  Administered 2020-08-28: 50 ug via INTRAVENOUS

## 2020-08-28 MED ORDER — WARFARIN SODIUM 5 MG PO TABS
5.0000 mg | ORAL_TABLET | Freq: Once | ORAL | Status: AC
  Administered 2020-08-28: 5 mg via ORAL
  Filled 2020-08-28: qty 1

## 2020-08-28 MED ORDER — BACITRACIN ZINC 500 UNIT/GM EX OINT
TOPICAL_OINTMENT | CUTANEOUS | Status: AC
  Filled 2020-08-28: qty 28.35

## 2020-08-28 MED ORDER — PHENYLEPHRINE HCL-NACL 10-0.9 MG/250ML-% IV SOLN
INTRAVENOUS | Status: DC | PRN
  Administered 2020-08-28: 40 ug/min via INTRAVENOUS

## 2020-08-28 MED ORDER — ROCURONIUM BROMIDE 10 MG/ML (PF) SYRINGE
PREFILLED_SYRINGE | INTRAVENOUS | Status: AC
  Filled 2020-08-28: qty 10

## 2020-08-28 MED ORDER — CHLORHEXIDINE GLUCONATE 0.12 % MT SOLN
15.0000 mL | OROMUCOSAL | Status: AC
  Administered 2020-08-28: 15 mL via OROMUCOSAL
  Filled 2020-08-28 (×2): qty 15

## 2020-08-28 MED ORDER — ROCURONIUM BROMIDE 10 MG/ML (PF) SYRINGE
PREFILLED_SYRINGE | INTRAVENOUS | Status: DC | PRN
  Administered 2020-08-28 (×2): 10 mg via INTRAVENOUS
  Administered 2020-08-28: 60 mg via INTRAVENOUS

## 2020-08-28 MED ORDER — SODIUM CHLORIDE 0.9 % IV SOLN: INTRAVENOUS | Status: DC | PRN

## 2020-08-28 MED ORDER — LIDOCAINE 2% (20 MG/ML) 5 ML SYRINGE
INTRAMUSCULAR | Status: DC | PRN
  Administered 2020-08-28: 30 mg via INTRAVENOUS

## 2020-08-28 MED ORDER — ACETAMINOPHEN 325 MG PO TABS
325.0000 mg | ORAL_TABLET | Freq: Four times a day (QID) | ORAL | Status: DC | PRN
  Administered 2020-08-29 – 2020-09-02 (×13): 650 mg via ORAL
  Filled 2020-08-28 (×13): qty 2

## 2020-08-28 MED ORDER — ONDANSETRON HCL 4 MG/2ML IJ SOLN: 4.0000 mg | Freq: Four times a day (QID) | INTRAMUSCULAR | Status: DC | PRN

## 2020-08-28 MED ORDER — POVIDONE-IODINE 10 % EX SWAB: 2.0000 "application " | Freq: Once | CUTANEOUS | Status: DC

## 2020-08-28 MED ORDER — OXYCODONE HCL 5 MG/5ML PO SOLN: 5.0000 mg | Freq: Once | ORAL | Status: DC | PRN

## 2020-08-28 MED ORDER — METHOCARBAMOL 500 MG PO TABS
500.0000 mg | ORAL_TABLET | Freq: Four times a day (QID) | ORAL | Status: DC | PRN
  Administered 2020-08-30: 500 mg via ORAL
  Filled 2020-08-28: qty 1

## 2020-08-28 MED ORDER — WARFARIN - PHARMACIST DOSING INPATIENT: Freq: Every day | Status: DC

## 2020-08-28 MED ORDER — ESMOLOL HCL 100 MG/10ML IV SOLN
INTRAVENOUS | Status: AC
  Filled 2020-08-28: qty 10

## 2020-08-28 MED ORDER — LIDOCAINE 2% (20 MG/ML) 5 ML SYRINGE
INTRAMUSCULAR | Status: AC
  Filled 2020-08-28: qty 5

## 2020-08-28 MED ORDER — PROPOFOL 10 MG/ML IV BOLUS
INTRAVENOUS | Status: DC | PRN
  Administered 2020-08-28: 110 mg via INTRAVENOUS

## 2020-08-28 MED ORDER — SUGAMMADEX SODIUM 200 MG/2ML IV SOLN
INTRAVENOUS | Status: DC | PRN
  Administered 2020-08-28: 200 mg via INTRAVENOUS

## 2020-08-28 MED ORDER — VANCOMYCIN HCL 1000 MG IV SOLR
INTRAVENOUS | Status: AC
  Filled 2020-08-28: qty 1000

## 2020-08-28 MED ORDER — PROPOFOL 10 MG/ML IV BOLUS
INTRAVENOUS | Status: AC
  Filled 2020-08-28: qty 20

## 2020-08-28 MED ORDER — FENTANYL CITRATE (PF) 100 MCG/2ML IJ SOLN
INTRAMUSCULAR | Status: DC | PRN
  Administered 2020-08-28: 25 ug via INTRAVENOUS
  Administered 2020-08-28: 50 ug via INTRAVENOUS
  Administered 2020-08-28 (×2): 25 ug via INTRAVENOUS
  Administered 2020-08-28: 50 ug via INTRAVENOUS

## 2020-08-28 MED ORDER — SODIUM CHLORIDE 0.9 % IV SOLN: INTRAVENOUS | Status: DC

## 2020-08-28 MED ORDER — METHOCARBAMOL 1000 MG/10ML IJ SOLN
500.0000 mg | Freq: Four times a day (QID) | INTRAVENOUS | Status: DC | PRN
  Filled 2020-08-28: qty 5

## 2020-08-28 MED ORDER — PHENOL 1.4 % MT LIQD: 1.0000 | OROMUCOSAL | Status: DC | PRN

## 2020-08-28 MED ORDER — TRANEXAMIC ACID-NACL 1000-0.7 MG/100ML-% IV SOLN
1000.0000 mg | INTRAVENOUS | Status: AC
  Administered 2020-08-28: 1000 mg via INTRAVENOUS
  Filled 2020-08-28: qty 100

## 2020-08-28 MED ORDER — FENTANYL CITRATE (PF) 250 MCG/5ML IJ SOLN
INTRAMUSCULAR | Status: AC
  Filled 2020-08-28: qty 5

## 2020-08-28 MED ORDER — ONDANSETRON HCL 4 MG/2ML IJ SOLN
INTRAMUSCULAR | Status: AC
  Filled 2020-08-28: qty 2

## 2020-08-28 MED ORDER — PHENYLEPHRINE 40 MCG/ML (10ML) SYRINGE FOR IV PUSH (FOR BLOOD PRESSURE SUPPORT)
PREFILLED_SYRINGE | INTRAVENOUS | Status: DC | PRN
  Administered 2020-08-28 (×2): 80 ug via INTRAVENOUS
  Administered 2020-08-28: 120 ug via INTRAVENOUS
  Administered 2020-08-28: 80 ug via INTRAVENOUS
  Administered 2020-08-28: 120 ug via INTRAVENOUS

## 2020-08-28 SURGICAL SUPPLY — 83 items
ADH SKN CLS APL DERMABOND .7 (GAUZE/BANDAGES/DRESSINGS) ×3
APL PRP STRL LF DISP 70% ISPRP (MISCELLANEOUS) ×2
BIT DRILL 4.3 (BIT) ×2
BIT DRILL 4.3X300MM (BIT) IMPLANT
BIT DRILL LONG 3.3 (BIT) ×1 IMPLANT
BIT DRILL QC 3.3X195 (BIT) ×1 IMPLANT
BLADE CLIPPER SURG (BLADE) IMPLANT
BNDG CMPR MED 10X6 ELC LF (GAUZE/BANDAGES/DRESSINGS) ×1
BNDG COHESIVE 6X5 TAN STRL LF (GAUZE/BANDAGES/DRESSINGS) ×2 IMPLANT
BNDG ELASTIC 6X10 VLCR STRL LF (GAUZE/BANDAGES/DRESSINGS) ×2 IMPLANT
BRUSH SCRUB EZ PLAIN DRY (MISCELLANEOUS) ×3 IMPLANT
CANISTER SUCT 3000ML PPV (MISCELLANEOUS) ×2 IMPLANT
CAP LOCK NCB (Cap) ×9 IMPLANT
CHLORAPREP W/TINT 26 (MISCELLANEOUS) ×3 IMPLANT
COVER SURGICAL LIGHT HANDLE (MISCELLANEOUS) ×3 IMPLANT
COVER WAND RF STERILE (DRAPES) ×1 IMPLANT
DERMABOND ADVANCED (GAUZE/BANDAGES/DRESSINGS) ×3
DERMABOND ADVANCED .7 DNX12 (GAUZE/BANDAGES/DRESSINGS) IMPLANT
DRAPE C-ARM 42X72 X-RAY (DRAPES) ×2 IMPLANT
DRAPE C-ARMOR (DRAPES) ×2 IMPLANT
DRAPE HALF SHEET 40X57 (DRAPES) ×4 IMPLANT
DRAPE ORTHO SPLIT 77X108 STRL (DRAPES) ×4
DRAPE SURG 17X23 STRL (DRAPES) ×2 IMPLANT
DRAPE SURG ORHT 6 SPLT 77X108 (DRAPES) ×2 IMPLANT
DRAPE U-SHAPE 47X51 STRL (DRAPES) ×2 IMPLANT
DRSG ADAPTIC 3X8 NADH LF (GAUZE/BANDAGES/DRESSINGS) IMPLANT
DRSG AQUACEL AG ADV 3.5X14 (GAUZE/BANDAGES/DRESSINGS) ×2 IMPLANT
DRSG MEPILEX BORDER 4X12 (GAUZE/BANDAGES/DRESSINGS) IMPLANT
DRSG MEPILEX BORDER 4X4 (GAUZE/BANDAGES/DRESSINGS) IMPLANT
DRSG MEPILEX BORDER 4X8 (GAUZE/BANDAGES/DRESSINGS) IMPLANT
DRSG PAD ABDOMINAL 8X10 ST (GAUZE/BANDAGES/DRESSINGS) ×6 IMPLANT
DRSG TEGADERM 2-3/8X2-3/4 SM (GAUZE/BANDAGES/DRESSINGS) ×1 IMPLANT
ELECT REM PT RETURN 9FT ADLT (ELECTROSURGICAL) ×2
ELECTRODE REM PT RTRN 9FT ADLT (ELECTROSURGICAL) ×1 IMPLANT
GAUZE SPONGE 2X2 8PLY STRL LF (GAUZE/BANDAGES/DRESSINGS) IMPLANT
GAUZE SPONGE 4X4 12PLY STRL (GAUZE/BANDAGES/DRESSINGS) ×2 IMPLANT
GLOVE BIO SURGEON STRL SZ 6.5 (GLOVE) ×6 IMPLANT
GLOVE BIO SURGEON STRL SZ7.5 (GLOVE) ×8 IMPLANT
GLOVE BIOGEL PI IND STRL 7.5 (GLOVE) ×1 IMPLANT
GLOVE BIOGEL PI INDICATOR 7.5 (GLOVE) ×1
GLOVE SURG UNDER POLY LF SZ6.5 (GLOVE) ×2 IMPLANT
GOWN STRL REUS W/ TWL LRG LVL3 (GOWN DISPOSABLE) ×3 IMPLANT
GOWN STRL REUS W/TWL LRG LVL3 (GOWN DISPOSABLE) ×6
K-WIRE 2.0 (WIRE) ×2
K-WIRE FXSTD 280X2XNS SS (WIRE) ×1
KIT BASIN OR (CUSTOM PROCEDURE TRAY) ×2 IMPLANT
KIT TURNOVER KIT B (KITS) ×2 IMPLANT
KWIRE FXSTD 280X2XNS SS (WIRE) IMPLANT
NS IRRIG 1000ML POUR BTL (IV SOLUTION) ×2 IMPLANT
PACK TOTAL JOINT (CUSTOM PROCEDURE TRAY) ×2 IMPLANT
PAD ARMBOARD 7.5X6 YLW CONV (MISCELLANEOUS) ×4 IMPLANT
PAD CAST 4YDX4 CTTN HI CHSV (CAST SUPPLIES) ×1 IMPLANT
PADDING CAST COTTON 4X4 STRL (CAST SUPPLIES) ×2
PADDING CAST COTTON 6X4 STRL (CAST SUPPLIES) ×3 IMPLANT
PLATE DISTAL FEMUR 18H 355MM (Plate) ×1 IMPLANT
SCREW 5.0 48MM (Screw) ×1 IMPLANT
SCREW 5.0 70MM (Screw) ×2 IMPLANT
SCREW NCB 3.5X75X5X6.2XST (Screw) IMPLANT
SCREW NCB 4.0 32MM (Screw) ×1 IMPLANT
SCREW NCB 5.0X36MM (Screw) ×2 IMPLANT
SCREW NCB 5.0X75MM (Screw) ×2 IMPLANT
SCREW NCB 5.0X85MM (Screw) ×1 IMPLANT
SCREW UNI 5.0 12MM (Screw) ×1 IMPLANT
SEALER BIPOLAR AQUA 6.0 (INSTRUMENTS) ×1 IMPLANT
SPONGE GAUZE 2X2 STER 10/PKG (GAUZE/BANDAGES/DRESSINGS) ×1
SPONGE LAP 18X18 RF (DISPOSABLE) IMPLANT
STAPLER VISISTAT 35W (STAPLE) ×4 IMPLANT
SUCTION FRAZIER HANDLE 10FR (MISCELLANEOUS)
SUCTION TUBE FRAZIER 10FR DISP (MISCELLANEOUS) ×1 IMPLANT
SUT ETHILON 3 0 PS 1 (SUTURE) ×2 IMPLANT
SUT MON AB 2-0 CT1 36 (SUTURE) ×5 IMPLANT
SUT VIC AB 0 CT1 27 (SUTURE)
SUT VIC AB 0 CT1 27XBRD ANBCTR (SUTURE) IMPLANT
SUT VIC AB 1 CT1 27 (SUTURE)
SUT VIC AB 1 CT1 27XBRD ANBCTR (SUTURE) IMPLANT
SUT VIC AB 1 CTX 36 (SUTURE) ×8
SUT VIC AB 1 CTX36XBRD ANBCTR (SUTURE) IMPLANT
SUT VIC AB 1 CTX36XBRD ANBCTRL (SUTURE) IMPLANT
SUT VIC AB 2-0 CT1 27 (SUTURE)
SUT VIC AB 2-0 CT1 TAPERPNT 27 (SUTURE) ×2 IMPLANT
TOWEL GREEN STERILE (TOWEL DISPOSABLE) ×4 IMPLANT
TRAY FOLEY MTR SLVR 16FR STAT (SET/KITS/TRAYS/PACK) IMPLANT
WATER STERILE IRR 1000ML POUR (IV SOLUTION) ×4 IMPLANT

## 2020-08-28 NOTE — Progress Notes (Signed)
Patient arrived on the floor from PACU at 2000 via bed.Telemetry box 5W1 placed back on patient and CCMD notified. Son at bedside. Will review post op orders and proceed with POC.

## 2020-08-28 NOTE — Anesthesia Postprocedure Evaluation (Signed)
Anesthesia Post Note  Patient: Richard Spears  Procedure(s) Performed: OPEN REDUCTION INTERNAL FIXATION (ORIF) DISTAL FEMUR FRACTURE (Left Leg Upper)     Patient location during evaluation: PACU Anesthesia Type: General Level of consciousness: awake Pain management: pain level controlled Vital Signs Assessment: post-procedure vital signs reviewed and stable Respiratory status: spontaneous breathing Cardiovascular status: stable Postop Assessment: no apparent nausea or vomiting Anesthetic complications: no   No complications documented.  Last Vitals:  Vitals:   08/28/20 0604 08/28/20 0749  BP: (!) 146/85 (!) 148/87  Pulse: 82 86  Resp: 20 15  Temp: 36.7 C 36.8 C  SpO2: 91% 97%    Last Pain:  Vitals:   08/28/20 0942  TempSrc:   PainSc: 0-No pain                 [name]

## 2020-08-28 NOTE — Progress Notes (Signed)
ANTICOAGULATION CONSULT NOTE - Follow Up Consult  Pharmacy Consult for Heparin, warfarin  Indication: Atrial Fibrillation, St Jude mech MVR  Allergies  Allergen Reactions   Flexeril [Cyclobenzaprine] Diarrhea   Total Body Weight: Height:  72 inches Heparin Dosing Weight:   Vital Signs: Temp: 98.2 F (36.8 C) (03/03 0749) Temp Source: Oral (03/03 0749) BP: 148/87 (03/03 0749) Pulse Rate: 86 (03/03 0749)  Labs: Recent Labs    08/26/20 0332 08/26/20 0915 08/26/20 1501 08/27/20 0426 08/27/20 1416 08/27/20 2237 08/28/20 0709  HGB 7.5*   < > 6.9* 8.4* 8.4*  --  10.6*  HCT 23.0*   < > 20.8* 24.8* 24.8*  --  31.6*  PLT 120*   < > 122* 119*  --   --  115*  APTT 43*  --   --   --   --   --   --   LABPROT 30.0*  --   --  23.5*  --   --  16.5*  INR 3.0*  --   --  2.2*  --   --  1.4*  HEPARINUNFRC  --   --   --   --  <0.10* 0.19*  --   CREATININE 1.37*  --   --  1.36*  --   --  1.31*   < > = values in this interval not displayed.    Estimated Creatinine Clearance: 35 mL/min (A) (by C-G formula based on SCr of 1.31 mg/dL (H)).  Assessment: 87 yr old man was on warfarin PTA for atrial fibrillation and St Jude MVR (INR goal 2.5-3.5) was admitted after a fall at home. Warfarin is on hold in anticipation of surgery for hip fracture, received 5mg  PO vitamin K on 3/1. Patient has hematoma on LUE from fall.  He is now s/p ORIF L femur. Per Dr. Delfino Lovett restart warfarin at the home dose tonight and restart heparin 24 hours post procedure. Previous rate was 1300 units/hr   Goal of Therapy:  Heparin level 0.3-0.7 units/ml Monitor platelets by anticoagulation protocol: Yes   Plan:  -warfarin 5mg  po tonight -Daily PT/INR -restart heparin 6:30pm on 3/4 -Will enter heparin orders on 3/4  Hildred Laser, PharmD Clinical Pharmacist **Pharmacist phone directory can now be found on [email or web address] ([email or web address]).  Listed under [place].

## 2020-08-28 NOTE — Plan of Care (Signed)
Patient is very pleasant. Heparin drip infusing at 13cc/hr per pharmacy dosing. Patient is finishing up first unit of blood and tolerating well. Bucks traction to LLE and SCDs to RLE are in place. Patient is now NPO, surgery scheduled for this afternoon. Patient understands. Will continue to monitor and continue current POC.

## 2020-08-28 NOTE — Transfer of Care (Signed)
Immediate Anesthesia Transfer of Care Note  Patient: Richard Spears  Procedure(s) Performed: OPEN REDUCTION INTERNAL FIXATION (ORIF) DISTAL FEMUR FRACTURE (Left Leg Upper)  Patient Location: PACU  Anesthesia Type:General  Level of Consciousness: awake, alert  and oriented  Airway & Oxygen Therapy: Patient Spontanous Breathing  Post-op Assessment: Report given to RN and Post -op Vital signs reviewed and stable  Post vital signs: Reviewed and stable  Last Vitals:  Vitals Value Taken Time  BP 157/97 08/28/20 1834  Temp    Pulse    Resp 18 08/28/20 1834  SpO2 100   Vitals shown include unvalidated device data.  Last Pain:  Vitals:   08/28/20 0942  TempSrc:   PainSc: 0-No pain      Patients Stated Pain Goal: 2 (59/47/07 6151)  Complications: No complications documented.

## 2020-08-28 NOTE — Plan of Care (Signed)
Patient is s/p ORIF of left femur by Dr. Lyla Glassing. ACE wrap all the way down patient's left leg with Hemovac drain in place. IVF infusing, continuous pulse ox and SCD to RLE applied. Patient is a little confused but can easily be reoriented. NAD or complaints of distress at this time. Will continue to monitor and continue current POC.

## 2020-08-28 NOTE — Anesthesia Procedure Notes (Signed)
Procedure Name: Intubation Date/Time: 08/28/2020 3:05 PM Performed by: Janene Harvey, CRNA Pre-anesthesia Checklist: Patient identified, Emergency Drugs available, Suction available and Patient being monitored Patient Re-evaluated:Patient Re-evaluated prior to induction Oxygen Delivery Method: Circle system utilized Preoxygenation: Pre-oxygenation with 100% oxygen Induction Type: IV induction Ventilation: Mask ventilation without difficulty Laryngoscope Size: Mac and 4 Grade View: Grade II Tube type: Oral Tube size: 7.5 mm Number of attempts: 1 Airway Equipment and Method: Stylet and Oral airway Placement Confirmation: ETT inserted through vocal cords under direct vision,  positive ETCO2 and breath sounds checked- equal and bilateral Secured at: 23 cm Tube secured with: Tape Dental Injury: Teeth and Oropharynx as per pre-operative assessment

## 2020-08-28 NOTE — Op Note (Signed)
OPERATIVE REPORT   08/28/2020  6:12 PM  PATIENT:  Richard Spears   SURGEON:  Bertram Savin, MD  ASSISTANT:  Laurelyn Sickle, PA-C.   PREOPERATIVE DIAGNOSIS: Left Vancouver C periprosthetic femur fracture.  POSTOPERATIVE DIAGNOSIS:  Same.  PROCEDURE: Open reduction internal fixation left femur.  ANESTHESIA:   GETA.  ANTIBIOTICS: 2 g Ancef.  IMPLANTS: Zimmer NCB distal femoral locking plate, size 18 holes. Bicortical interlocking screws x8. Unicortical screw x1. Locking caps x9. 18-gauge stainless steel wire x2.  ESTIMATED BLOOD LOSS: 1500 cc.  BLOOD PRODUCTS: 3 units packed red blood cells.  SPECIMENS: None.  COMPLICATIONS: None.  DISPOSITION: Stable to PACU.  SURGICAL INDICATIONS:  Richard Spears is a 87 y.o. male who had a mechanical ground-level fall at home, injuring his left femur.  He had left lower extremity pain and inability to weight-bear.  He has a history of left total hip arthroplasty by Dr. Wynelle Link.  He was brought to the emergency department at [facility], where x-rays revealed a Vancouver C periprosthetic femur fracture.  He was admitted to the hospitalist service for perioperative risk ratification medical optimization.  Please note that he has a mechanical heart valve and takes Coumadin at home.  His Coumadin was held.  He was started on a heparin drip when his INR fell below 2.5.  He was indicated for ORIF of the left femur.  We held the heparin drip preoperatively.  He required packed red blood cells for anemia.  The risks, benefits, and alternatives were discussed with the patient preoperatively including but not limited to the risks of infection, bleeding, nerve / blood vessel injury, malunion, nonunion, cardiopulmonary complications, the need for repeat surgery, among others, and the patient was willing to proceed.  PROCEDURE IN DETAIL: Patient was identified in the holding area using 2 identifiers.  The surgical/marked by myself.   He was taken the operating room placed supine on the operating table.  General anesthesia was obtained.  He was flipped to the right lateral decubitus position.  Axillary roll was placed.  All bony prominences were well-padded.  He was secured with a beanbag positioner.  The left hip and femur were prepped and draped in the normal sterile surgical fashion.  Timeout called, verifying site and site of surgery.  I began by using fluoroscopy to define his anatomy.  A longitudinal incision was created from Gerdy's tubercle up to the greater trochanter.  Full-thickness skin flaps were created.  The IT band was split in line with fibers.  The vastus lateralis was identified and dissected carefully off the posterior intermuscular septum.  Perforators were identified and coagulated with AquaMantys cautery.  The vastus lateralis was retracted anteriorly.  The fracture was identified.  There is a long spiral fracture with a small amount of comminution.  2 small loose bone fragments were removed.  The fracture site was cleared of hematoma with curettes and irrigated with saline.  The fracture was reduced with traction, and reduction was held with a lobster claw.  A double set of 18-gauge stainless steel wire was passed subperiosteally proximal and distal to the lobster-claw.  The wire was tightened and clipped.  An 18 hole plate was selected and secured to the femur with K wires.  AP and lateral fluoroscopy views were used to confirm reduction of the fracture and placement of the plate.  A total of 5 bicortical screws were placed in the distal articular block.  I was able to place 2 bicortical screws  in the proximal fragment distal to the tip of the stem.  In the most proximal hole, I placed a unicortical screw.  I was able to place a bicortical screw anterior to the stem.  Locking caps were applied to all of the screws.  Final AP and lateral fluoroscopy views were used to confirm hardware placement and reduction of the  fracture.  The wound was copiously irrigated with irrisept solution followed by normal saline.  The IT band was closed over a medium Hemovac drain with #1 Vicryl.  Deep dermal layer was closed with 2-0 Monocryl.  Skin was reapproximated with staples.  Dermabond was applied to the skin.  Once the glue was fully dried, Aquacel dressing was applied.  Bulky dressing using cast padding and an Ace wrap was applied.  Patient was then flipped supine, extubated, and taken to the PACU in stable condition.  Sponge, needle, and instrument counts were correct at the end of the case x2.  There were no known complications.  POSTOPERATIVE PLAN: Postoperatively, the patient be readmitted to the hospitalist.  Touchdown weightbearing left lower extremity with a walker.  Unlimited range of motion of the left knee.  Mobilize out of bed with physical therapy.  We will resume Coumadin this evening.  24 hours postoperatively, heparin drip may be resumed without bolus.  P.o. pain control.  Disposition planning.  Return to the office in 2 weeks for routine postoperative care.

## 2020-08-28 NOTE — Progress Notes (Signed)
Heparin infusion dose changed to 13 ml/hr per pharmacy order, will continue to monitor patient and informed the assigned nurse

## 2020-08-28 NOTE — Interval H&P Note (Signed)
History and Physical Interval Note:  08/28/2020 2:23 PM  Richard Spears  has presented today for surgery, with the diagnosis of L periprosthetic femur fracture.  The various methods of treatment have been discussed with the patient and family. After consideration of risks, benefits and other options for treatment, the patient has consented to  Procedure(s): OPEN REDUCTION INTERNAL FIXATION (ORIF) DISTAL FEMUR FRACTURE (Left) as a surgical intervention.  The patient's history has been reviewed, patient examined, no change in status, stable for surgery.  I have reviewed the patient's chart and labs.  Questions were answered to the patient's satisfaction.    Hgb > 10 INR 1.4  The risks, benefits, and alternatives were discussed with the patient. There are risks associated with the surgery including, but not limited to, problems with anesthesia (death), infection, differences in leg length/angulation/rotation, fracture of bones, loosening or failure of implants, malunion, nonunion, hematoma (blood accumulation) which may require surgical drainage, blood clots, pulmonary embolism, nerve injury (foot drop), and blood vessel injury. The patient understands these risks and elects to proceed.    Hilton Cork Beya Tipps

## 2020-08-28 NOTE — Progress Notes (Signed)
PROGRESS NOTE    Richard Spears   WEX:[id number]  DOB: 05-31-1926  DOA: 08/25/2020     2  PCP: [name], MD  CC: fall at home  Hospital Course: Richard Spears is a 87 yo male with PMH PAF, mechanical MVR (1997, on Coumadin), BPH, OA, previous R/L hip arthroplasties, hypertension who presented to the ER with severe left thigh pain after a fall at home.  Fall was considered mechanical in nature.    Imaging revealed a significant acute spiral fracture of the distal left femoral diaphysis with displacement.  Traction was initiated and he was admitted for further work-up and surgical evaluation.  Coumadin was placed on hold and he was transitioned to a heparin drip when INR fell below 2.5. He also developed a downtrend in hemoglobin and was transfused 2 units PRBC on 08/26/2020. Hemoglobin improved to 8.4 g/dL after transfusion and he was given 2 more units per orthopedic surgery.  Follow-up hemoglobin the following morning was 10.6 g/dL.   Interval History:  No events overnight. Son bedside this morning. Surgery is planned for today. Patient feels okay overall, pain controlled but present.   ROS: Constitutional: negative for chills and fevers, Respiratory: negative for cough, Cardiovascular: negative for chest pain and Gastrointestinal: negative for abdominal pain  Assessment & Plan: * Closed displaced fracture of distal epiphysis of left femur (HCC) - s/p mechanical fall at home - holding coumadin in anticipation of surgery; s/p Vit K per ortho on 3/1 - continue heparin drip; to be held prior to surgery per ortho rec's - continue pain control  -Tentative plan is for surgical repair on Thursday - continue LLE neurovascular checks   S/P MVR (mitral valve replacement) - goal INR 2.5 - 3.5 - holding warf for surgery - start heparin drip when INR<2.5, started on 3/1 - will need bridging treatment back after surgery  Macrocytic anemia - iron 60, sat ratio 15%, TIBC ULN  (413) - folate 44, B12 478 - continue MVI - Hgb down to 7.6 g/dL on 3/1; no s/s bleeding or compartment syndrome; ortho transfusing 2 units PRBC - repeat Hgb today 8.4 g/dL on 3/2, transfused 2 more units per ortho - Hgb this am 10.6 g/dL  Alcohol use - no s/s withdrawal - d/c CIWA  Essential hypertension -Continue bisoprolol, Lasix, hydralazine, Imdur  Longstanding persistent atrial fibrillation: CHA2DS2-VASc Score 3 - on Coumadin chronically -Continue bisoprolol  AKI (acute kidney injury) (HCC)-resolved as of 08/27/2020 - baseline creatinine ~ 1.1 - patient presents with increase in creat >0.3 mg/dL above baseline presumed to have occurred within past 7 days PTA - continue IVF - BMP in am   Old records reviewed in assessment of this patient  Antimicrobials: n/a  DVT prophylaxis: SCDs Start: 08/25/20 2054   Code Status:   Code Status: Full Code Family Communication: son bedside   Disposition Plan: Status is: Observation  The patient will require care spanning > 2 midnights and should be moved to inpatient because: Ongoing active pain requiring inpatient pain management, IV treatments appropriate due to intensity of illness or inability to take PO and Inpatient level of care appropriate due to severity of illness  Dispo: The patient is from: Home              Anticipated d/c is to: pending PT eval after surgery              Patient currently is not medically stable to d/c.   Difficult to place patient No  Risk of unplanned readmission score: Unplanned Admission- Pilot do not use: 19.63   Objective: Blood pressure (!) 148/87, pulse 86, temperature 98.2 F (36.8 C), temperature source Oral, resp. rate 15, height 6' (1.829 m), weight 70.3 kg, SpO2 97 %.  Examination: General appearance: alert, cooperative and no distress Head: Normocephalic, without obvious abnormality, atraumatic Eyes: EOMI Lungs: clear to auscultation bilaterally Heart: irregularly irregular  rhythm and S1, S2 normal Abdomen: normal findings: bowel sounds normal and soft, non-tender Extremities: LLE noted in traction sling; TTP; more swelling now noted in left thigh but negative for signs of compartment compromise Skin: mobility and turgor normal Neurologic: LLE deferred but sensation intact in LLE and no other obvious focal deficits   Consultants:   Ortho surgery  Procedures:     Data Reviewed: I have personally reviewed following labs and imaging studies Results for orders placed or performed during the hospital encounter of 08/25/20 (from the past 24 hour(s))  Heparin level (unfractionated)     Status: Abnormal   Collection Time: 08/27/20  2:16 PM  Result Value Ref Range   Heparin Unfractionated <0.10 (L) 0.30 - 0.70 IU/mL  Hemoglobin and hematocrit, blood     Status: Abnormal   Collection Time: 08/27/20  2:16 PM  Result Value Ref Range   Hemoglobin 8.4 (L) 13.0 - 17.0 g/dL   HCT 24.8 (L) 39.0 - 52.0 %  Prepare RBC (crossmatch)     Status: None   Collection Time: 08/27/20  7:00 PM  Result Value Ref Range   Order Confirmation      ORDER PROCESSED BY BLOOD BANK Performed at [facility], [address] [place], [place] [address]   Heparin level (unfractionated)     Status: Abnormal   Collection Time: 08/27/20 10:37 PM  Result Value Ref Range   Heparin Unfractionated 0.19 (L) 0.30 - 0.70 IU/mL  Protime-INR     Status: Abnormal   Collection Time: 08/28/20  7:09 AM  Result Value Ref Range   Prothrombin Time 16.5 (H) 11.4 - 15.2 seconds   INR 1.4 (H) 0.8 - 1.2  Basic metabolic panel     Status: Abnormal   Collection Time: 08/28/20  7:09 AM  Result Value Ref Range   Sodium 137 135 - 145 mmol/L   Potassium 3.7 3.5 - 5.1 mmol/L   Chloride 104 98 - 111 mmol/L   CO2 23 22 - 32 mmol/L   Glucose, Bld 103 (H) 70 - 99 mg/dL   BUN 26 (H) 8 - 23 mg/dL   Creatinine, Ser 1.31 (H) 0.61 - 1.24 mg/dL   Calcium 8.4 (L) 8.9 - 10.3 mg/dL   GFR, Estimated 51 (L) >60  mL/min   Anion gap 10 5 - 15  CBC with Differential/Platelet     Status: Abnormal   Collection Time: 08/28/20  7:09 AM  Result Value Ref Range   WBC 4.4 4.0 - 10.5 K/uL   RBC 3.36 (L) 4.22 - 5.81 MIL/uL   Hemoglobin 10.6 (L) 13.0 - 17.0 g/dL   HCT 31.6 (L) 39.0 - 52.0 %   MCV 94.0 80.0 - 100.0 fL   MCH 31.5 26.0 - 34.0 pg   MCHC 33.5 30.0 - 36.0 g/dL   RDW 17.2 (H) 11.5 - 15.5 %   Platelets 115 (L) 150 - 400 K/uL   nRBC 0.5 (H) 0.0 - 0.2 %   Neutrophils Relative % 74 %   Neutro Abs 3.3 1.7 - 7.7 K/uL   Lymphocytes Relative 8 %  Lymphs Abs 0.4 (L) 0.7 - 4.0 K/uL   Monocytes Relative 13 %   Monocytes Absolute 0.6 0.1 - 1.0 K/uL   Eosinophils Relative 3 %   Eosinophils Absolute 0.2 0.0 - 0.5 K/uL   Basophils Relative 1 %   Basophils Absolute 0.0 0.0 - 0.1 K/uL   Immature Granulocytes 1 %   Abs Immature Granulocytes 0.05 0.00 - 0.07 K/uL  Magnesium     Status: None   Collection Time: 08/28/20  7:09 AM  Result Value Ref Range   Magnesium 2.1 1.7 - 2.4 mg/dL    Recent Results (from the past 240 hour(s))  Resp Panel by RT-PCR (Flu A&B, Covid) Nasopharyngeal Swab     Status: None   Collection Time: 08/25/20  6:32 PM   Specimen: Nasopharyngeal Swab; Nasopharyngeal(NP) swabs in vial transport medium  Result Value Ref Range Status   SARS Coronavirus 2 by RT PCR NEGATIVE NEGATIVE Final    Comment: (NOTE) SARS-CoV-2 target nucleic acids are NOT DETECTED.  The SARS-CoV-2 RNA is generally detectable in upper respiratory specimens during the acute phase of infection. The lowest concentration of SARS-CoV-2 viral copies this assay can detect is 138 copies/mL. A negative result does not preclude SARS-Cov-2 infection and should not be used as the sole basis for treatment or other patient management decisions. A negative result may occur with  improper specimen collection/handling, submission of specimen other than nasopharyngeal swab, presence of viral mutation(s) within the areas  targeted by this assay, and inadequate number of viral copies(<138 copies/mL). A negative result must be combined with clinical observations, patient history, and epidemiological information. The expected result is Negative.  Fact Sheet for Patients:  EntrepreneurPulse.com.au  Fact Sheet for Healthcare Providers:  IncredibleEmployment.be  This test is no t yet approved or cleared by the [place] FDA and  has been authorized for detection and/or diagnosis of SARS-CoV-2 by FDA under an Emergency Use Authorization (EUA). This EUA will remain  in effect (meaning this test can be used) for the duration of the COVID-19 declaration under Section 564(b)(1) of the Act, 21 U.S.C.section 360bbb-3(b)(1), unless the authorization is terminated  or revoked sooner.       Influenza A by PCR NEGATIVE NEGATIVE Final   Influenza B by PCR NEGATIVE NEGATIVE Final    Comment: (NOTE) The Xpert Xpress SARS-CoV-2/FLU/RSV plus assay is intended as an aid in the diagnosis of influenza from Nasopharyngeal swab specimens and should not be used as a sole basis for treatment. Nasal washings and aspirates are unacceptable for Xpert Xpress SARS-CoV-2/FLU/RSV testing.  Fact Sheet for Patients: EntrepreneurPulse.com.au  Fact Sheet for Healthcare Providers: IncredibleEmployment.be  This test is not yet approved or cleared by the [place] FDA and has been authorized for detection and/or diagnosis of SARS-CoV-2 by FDA under an Emergency Use Authorization (EUA). This EUA will remain in effect (meaning this test can be used) for the duration of the COVID-19 declaration under Section 564(b)(1) of the Act, 21 U.S.C. section 360bbb-3(b)(1), unless the authorization is terminated or revoked.  Performed at [facility], [place] [address], [place], [place] [address]   Surgical pcr screen     Status: None   Collection Time: 08/26/20   5:49 PM   Specimen: Nasal Mucosa; Nasal Swab  Result Value Ref Range Status   MRSA, PCR NEGATIVE NEGATIVE Final   Staphylococcus aureus NEGATIVE NEGATIVE Final    Comment: (NOTE) The Xpert SA Assay (FDA approved for NASAL specimens in patients 64 years of age  and older), is one component of a comprehensive surveillance program. It is not intended to diagnose infection nor to guide or monitor treatment. Performed at [facility], [place] [address], [place],  [address]      Radiology Studies: No results found. DG Pelvis 1-2 Views  Final Result    DG FEMUR MIN 2 VIEWS LEFT  Final Result    DG Chest Portable 1 View  Final Result      Scheduled Meds: . sodium chloride   Intravenous Once  . bisoprolol  5 mg Oral Daily  . chlorhexidine  60 mL Topical Once  . cholecalciferol  1,000 Units Oral Daily  . folic acid  1 mg Oral Daily  . furosemide  40 mg Oral Daily  . hydrALAZINE  25 mg Oral Q lunch  . isosorbide mononitrate  30 mg Oral Daily  . multivitamin with minerals  1 tablet Oral Daily  . povidone-iodine  2 application Topical Once  . thiamine  100 mg Oral Daily   Or  . thiamine  100 mg Intravenous Daily   PRN Meds: acetaminophen **OR** acetaminophen, fluticasone, methocarbamol, morphine injection **OR** morphine injection, ondansetron **OR** ondansetron (ZOFRAN) IV, polyethylene glycol Continuous Infusions: .  ceFAZolin (ANCEF) IV    . tranexamic acid       LOS: 2 days  Time spent: Greater than 50% of the 35 minute visit was spent in counseling/coordination of care for the patient as laid out in the A&P.   Dwyane Dee, MD [facility] 08/28/2020, 12:57 PM

## 2020-08-28 NOTE — Progress Notes (Signed)
Heparin drip stopped per pre op order.

## 2020-08-28 NOTE — Anesthesia Preprocedure Evaluation (Addendum)
Anesthesia Evaluation  Patient identified by MRN, date of birth, ID band Patient awake    Reviewed: Allergy & Precautions, NPO status , Patient's Chart, lab work & pertinent test results  History of Anesthesia Complications Negative for: history of anesthetic complications  Airway Mallampati: II  TM Distance: >3 FB Neck ROM: Full    Dental  (+) Chipped, Dental Advisory Given   Pulmonary former smoker,  08/25/2020 SARS coronavirus NEG   breath sounds clear to auscultation       Cardiovascular hypertension, Pt. on medications and Pt. on home beta blockers (-) angina+ dysrhythmias Atrial Fibrillation + Valvular Problems/Murmurs AI  Rhythm:Irregular Rate:Normal + Systolic murmurs  '20 TTE - EF 55-60%. Moderately increased left ventricular wall thickness. RV cavity was severely enlarged. Right ventricular systolic pressure is moderately elevated. PASP estimated at 27mmHg. Left atrial size was severely dilated. Right atrial size was severely dilated. S/P St Jude bileaflet mechanical MVR that appears to be functioning normally. Mitral valve regurgitation cannot assess due to mechanical valve shadowing. MV Mean grad: 7.0 mmHg MV Area (PHT): 3.38 cm. Mild sclerosis of the aortic valve. Aortic valve regurgitation is mild to moderate by color flow Doppler.     Neuro/Psych negative neurological ROS  negative psych ROS   GI/Hepatic negative GI ROS, Neg liver ROS,   Endo/Other  negative endocrine ROS  Renal/GU Renal InsufficiencyRenal disease (creat 1.31)     Musculoskeletal  (+) Arthritis ,   Abdominal   Peds  Hematology  (+) anemia , Coumadin INR 1.4 Thrombocytopenia, Plt 115k    Anesthesia Other Findings   Reproductive/Obstetrics                           Anesthesia Physical Anesthesia Plan  ASA: III  Anesthesia Plan: General   Post-op Pain Management:    Induction: Intravenous  PONV Risk  Score and Plan: 2 and Treatment may vary due to age or medical condition, Ondansetron, Propofol infusion and Dexamethasone  Airway Management Planned: Oral ETT  Additional Equipment: None  Intra-op Plan:   Post-operative Plan: Extubation in OR  Informed Consent: I have reviewed the patients History and Physical, chart, labs and discussed the procedure including the risks, benefits and alternatives for the proposed anesthesia with the patient or authorized representative who has indicated his/her understanding and acceptance.     Dental advisory given  Plan Discussed with: CRNA, Anesthesiologist and Surgeon  Anesthesia Plan Comments:        Anesthesia Quick Evaluation

## 2020-08-29 ENCOUNTER — Encounter (HOSPITAL_COMMUNITY): Payer: Self-pay | Admitting: Orthopedic Surgery

## 2020-08-29 LAB — TYPE AND SCREEN
ABO/RH(D): O POS
Antibody Screen: NEGATIVE
Unit division: 0
Unit division: 0
Unit division: 0
Unit division: 0
Unit division: 0
Unit division: 0
Unit division: 0
Unit division: 0

## 2020-08-29 LAB — BASIC METABOLIC PANEL
Anion gap: 11 (ref 5–15)
BUN: 34 mg/dL — ABNORMAL HIGH (ref 8–23)
CO2: 22 mmol/L (ref 22–32)
Calcium: 8 mg/dL — ABNORMAL LOW (ref 8.9–10.3)
Chloride: 104 mmol/L (ref 98–111)
Creatinine, Ser: 1.15 mg/dL (ref 0.61–1.24)
GFR, Estimated: 59 mL/min — ABNORMAL LOW (ref >60)
Glucose, Bld: 134 mg/dL — ABNORMAL HIGH (ref 70–99)
Potassium: 4.1 mmol/L (ref 3.5–5.1)
Sodium: 137 mmol/L (ref 135–145)

## 2020-08-29 LAB — CBC WITH DIFFERENTIAL/PLATELET
Abs Immature Granulocytes: 0.03 10*3/uL (ref 0.00–0.07)
Basophils Absolute: 0 10*3/uL (ref 0.0–0.1)
Basophils Relative: 0 %
Eosinophils Absolute: 0 10*3/uL (ref 0.0–0.5)
Eosinophils Relative: 0 %
HCT: 32.4 % — ABNORMAL LOW (ref 39.0–52.0)
Hemoglobin: 11 g/dL — ABNORMAL LOW (ref 13.0–17.0)
Immature Granulocytes: 1 %
Lymphocytes Relative: 5 %
Lymphs Abs: 0.3 10*3/uL — ABNORMAL LOW (ref 0.7–4.0)
MCH: 30.9 pg (ref 26.0–34.0)
MCHC: 34 g/dL (ref 30.0–36.0)
MCV: 91 fL (ref 80.0–100.0)
Monocytes Absolute: 0.7 10*3/uL (ref 0.1–1.0)
Monocytes Relative: 12 %
Neutro Abs: 4.3 10*3/uL (ref 1.7–7.7)
Neutrophils Relative %: 82 %
Platelets: 106 10*3/uL — ABNORMAL LOW (ref 150–400)
RBC: 3.56 MIL/uL — ABNORMAL LOW (ref 4.22–5.81)
RDW: 17.2 % — ABNORMAL HIGH (ref 11.5–15.5)
WBC: 5.3 10*3/uL (ref 4.0–10.5)
nRBC: 0.4 % — ABNORMAL HIGH (ref 0.0–0.2)

## 2020-08-29 LAB — BPAM RBC
Blood Product Expiration Date: 202204012359
Blood Product Expiration Date: 202204012359
Blood Product Expiration Date: 202204012359
Blood Product Expiration Date: 202204012359
Blood Product Expiration Date: 202204012359
Blood Product Expiration Date: 202204022359
Blood Product Expiration Date: 202204022359
Blood Product Expiration Date: 202204022359
ISSUE DATE / TIME: 202203011541
ISSUE DATE / TIME: 202203012306
ISSUE DATE / TIME: 202203022342
ISSUE DATE / TIME: 202203030347
ISSUE DATE / TIME: 202203031553
ISSUE DATE / TIME: 202203031553
ISSUE DATE / TIME: 202203031635
ISSUE DATE / TIME: 202203040817
Unit Type and Rh: 5100
Unit Type and Rh: 5100
Unit Type and Rh: 5100
Unit Type and Rh: 5100
Unit Type and Rh: 5100
Unit Type and Rh: 5100
Unit Type and Rh: 5100
Unit Type and Rh: 5100

## 2020-08-29 LAB — PROTIME-INR
INR: 1.4 — ABNORMAL HIGH (ref 0.8–1.2)
Prothrombin Time: 16.5 seconds — ABNORMAL HIGH (ref 11.4–15.2)

## 2020-08-29 MED ORDER — WARFARIN SODIUM 5 MG PO TABS
5.0000 mg | ORAL_TABLET | Freq: Once | ORAL | Status: AC
  Administered 2020-08-29: 5 mg via ORAL
  Filled 2020-08-29: qty 1

## 2020-08-29 MED ORDER — POLYETHYLENE GLYCOL 3350 17 G PO PACK: 17.0000 g | PACK | Freq: Every day | ORAL | Status: DC

## 2020-08-29 MED ORDER — HEPARIN (PORCINE) 25000 UT/250ML-% IV SOLN
1500.0000 [IU]/h | INTRAVENOUS | Status: DC
  Administered 2020-08-29: 20:00:00 1300 [IU]/h via INTRAVENOUS
  Administered 2020-08-30 – 2020-09-02 (×5): 1500 [IU]/h via INTRAVENOUS
  Filled 2020-08-29 (×7): qty 250

## 2020-08-29 MED ORDER — MAGNESIUM CITRATE PO SOLN
1.0000 | Freq: Once | ORAL | Status: AC
  Administered 2020-08-29: 1 via ORAL
  Filled 2020-08-29: qty 296

## 2020-08-29 MED ORDER — SENNOSIDES-DOCUSATE SODIUM 8.6-50 MG PO TABS
1.0000 | ORAL_TABLET | Freq: Two times a day (BID) | ORAL | Status: DC
  Administered 2020-08-29: 1 via ORAL
  Filled 2020-08-29: qty 1

## 2020-08-29 MED ORDER — HYDROCODONE-ACETAMINOPHEN 5-325 MG PO TABS: 1.0000 | ORAL_TABLET | ORAL | 0 refills | Status: DC | PRN

## 2020-08-29 NOTE — Care Management Important Message (Signed)
Important Message  Patient Details  Name: JANET DECESARE MRN: [id number] Date of Birth: 1925/11/15   Medicare Important Message Given:  Yes     Noelia Lenart P Wajiha Versteeg 08/29/2020, 2:16 PM

## 2020-08-29 NOTE — Evaluation (Signed)
Physical Therapy Evaluation Patient Details Name: Richard Spears MRN: [id number] DOB: April 15, 1926 Today's Date: 08/29/2020   History of Present Illness  The pt is a 87 yo male presenting 2/28 after sustaining a fall outside. The pt was found to have a spiral fx of the distal L femur and is now s/p ORIF of L femur 3/3. PMH includes: afib, BPH, OA, HTN, mechanical mitral valve replacement, and R/L hip arthroplasties.  Clinical Impression  Pt in bed upon arrival of PT, agreeable to evaluation at this time. Prior to admission the pt was independent with mobility in the home, using walking stick for longer community ambulation, living at home with his wife. The pt now presents with limitations in functional mobility, strength, power, and stability due to above dx and resulting pain, and will continue to benefit from skilled PT to address these deficits. The pt was able to complete initial bed mobility with mod/maxA today, requiring assist for management of LLE as well as cues for all sequencing and use of BUE to pull to sit. The pt was then able to attempt sit-stand, but was unable to maintain WB precautions on LLE during stand despite maxA and BUE support on RW. Used modA and lateral scooting to move along EOB and maxA to return to supine. The pt will continue to benefit from skilled PT to progress functional strength, power, and ability to transfer while maintaining <30% WB through LLE.      Follow Up Recommendations SNF;Supervision/Assistance - 24 hour    Equipment Recommendations   (defer to post acute)    Recommendations for Other Services   OT consult    Precautions / Restrictions Precautions Precautions: Fall Precaution Comments: pt admitted for a fall Restrictions Weight Bearing Restrictions: Yes LLE Weight Bearing: Partial weight bearing LLE Partial Weight Bearing Percentage or Pounds: 30% Other Position/Activity Restrictions: treated as TDWB for mobility      Mobility  Bed  Mobility Overal bed mobility: Needs Assistance Bed Mobility: Supine to Sit;Sit to Supine     Supine to sit: Max assist;HOB elevated Sit to supine: Max assist;HOB elevated   General bed mobility comments: pt able to assist with some movement of RLE and use BUE to pull on bed rails to initiate movement of trunk. required maxA to manage LLE as well as maxA with use of bed pad to complete transfer to sitting EOB and max to return to supine    Transfers Overall transfer level: Needs assistance Equipment used: Rolling walker (2 wheeled) Transfers: Sit to/from Stand;Lateral/Scoot Transfers Sit to Stand: Max assist;From elevated surface        Lateral/Scoot Transfers: Mod assist General transfer comment: maxA with RW to attempt stand from elevated EOB, pt with poor functional adherence to WB status. required increased time to power up adn cues for hip and knee extension (on right).  Ambulation/Gait             General Gait Details: deferred due to pt ninability to maintain TDWB      Balance Overall balance assessment: Needs assistance Sitting-balance support: Bilateral upper extremity supported;Feet supported Sitting balance-Leahy Scale: Poor Sitting balance - Comments: reliant on BUE support Postural control: Posterior lean Standing balance support: Bilateral upper extremity supported Standing balance-Leahy Scale: Poor Standing balance comment: reliant on BUE support                             Pertinent Vitals/Pain Pain Assessment: No/denies pain  Home Living Family/patient expects to be discharged to:: Private residence Living Arrangements: Spouse/significant other Available Help at Discharge: Family;Other (Comment) (pt lives with wife who is unable to physically assist. son visiting from [place], can stay for a few weeks) Type of Home: Apartment (at [place]) Home Access: Level entry;Elevator     Home Layout: One level Home Equipment: Shower seat;Shower seat -  built in;Grab bars - toilet;Hand held shower head;Grab bars - tub/shower;Walker - 2 wheels;Cane - single point Additional Comments: at [place], cleaners 1x monthly, get one meal provided each day    Prior Function Level of Independence: Independent         Comments: pt previously independent, driving, able to mobilize without AD, but would carry walking stick for longer distances outside.     Hand Dominance   Dominant Hand: Right    Extremity/Trunk Assessment   Upper Extremity Assessment Upper Extremity Assessment: Overall WFL for tasks assessed    Lower Extremity Assessment Lower Extremity Assessment: LLE deficits/detail;Generalized weakness LLE Deficits / Details: able to complete ankle pumps with good ROM, slight SLR, but no other active movement at this time due to pain LLE: Unable to fully assess due to pain    Cervical / Trunk Assessment Cervical / Trunk Assessment: Normal  Communication   Communication: No difficulties  Cognition Arousal/Alertness: Awake/alert Behavior During Therapy: WFL for tasks assessed/performed Overall Cognitive Status: Impaired/Different from baseline Area of Impairment: Memory;Safety/judgement;Problem solving                     Memory: Decreased recall of precautions   Safety/Judgement: Decreased awareness of safety   Problem Solving: Decreased initiation;Difficulty sequencing;Requires verbal cues General Comments: verbal cues for WB status repeated through session with pt unable to maintain.      General Comments General comments (skin integrity, edema, etc.): VSS through session. son present and supportive    Exercises     Assessment/Plan    PT Assessment Patient needs continued PT services  PT Problem List Decreased strength;Decreased range of motion;Decreased activity tolerance;Decreased balance;Decreased mobility;Decreased coordination;Decreased knowledge of use of DME;Decreased safety awareness;Pain       PT  Treatment Interventions DME instruction;Stair training;Gait training;Functional mobility training;Therapeutic activities;Therapeutic exercise;Balance training;Patient/family education    PT Goals (Current goals can be found in the Care Plan section)  Acute Rehab PT Goals Patient Stated Goal: return home PT Goal Formulation: With patient Time For Goal Achievement: 09/12/20 Potential to Achieve Goals: Good    Frequency Min 3X/week   Barriers to discharge Decreased caregiver support pt wife unable to offer physical assistance       AM-PAC PT "6 Clicks" Mobility  Outcome Measure Help needed turning from your back to your side while in a flat bed without using bedrails?: A Lot Help needed moving from lying on your back to sitting on the side of a flat bed without using bedrails?: A Lot Help needed moving to and from a bed to a chair (including a wheelchair)?: Total Help needed standing up from a chair using your arms (e.g., wheelchair or bedside chair)?: Total Help needed to walk in hospital room?: Total Help needed climbing 3-5 steps with a railing? : Total 6 Click Score: 8    End of Session Equipment Utilized During Treatment: Gait belt Activity Tolerance: Patient limited by pain;Patient tolerated treatment well Patient left: in bed;with call bell/phone within reach;with bed alarm set;with family/visitor present Nurse Communication: Mobility status;Weight bearing status PT Visit Diagnosis: Muscle weakness (generalized) (M62.81);Other abnormalities of  gait and mobility (R26.89);Pain Pain - Right/Left: Right Pain - part of body: Knee;Leg    Time: 9163-8466 PT Time Calculation (min) (ACUTE ONLY): 56 min   Charges:   PT Evaluation $PT Eval Moderate Complexity: 1 Mod PT Treatments $Therapeutic Exercise: 8-22 mins $Therapeutic Activity: 23-37 mins        Karma Ganja, PT, DPT   Acute Rehabilitation Department Pager #: [phone]  Otho Bellows 08/29/2020, 10:33 AM

## 2020-08-29 NOTE — Progress Notes (Signed)
ANTICOAGULATION CONSULT NOTE - Follow Up Consult  Pharmacy Consult for Heparin, warfarin  Indication: Atrial Fibrillation, St Jude mech MVR  Allergies  Allergen Reactions  . Flexeril [Cyclobenzaprine] Diarrhea   Heparin Dosing Weight: 70.3 kg  Vital Signs: Temp: 98 F (36.7 C) (03/04 0759) Temp Source: Oral (03/04 0550) BP: 128/75 (03/04 0759) Pulse Rate: 75 (03/04 0759)  Labs: Recent Labs    08/27/20 0426 08/27/20 1416 08/27/20 2237 08/28/20 0709 08/28/20 1724 08/29/20 0804  HGB 8.4* 8.4*  --  10.6* 11.9* 11.0*  HCT 24.8* 24.8*  --  31.6* 35.0* 32.4*  PLT 119*  --   --  115*  --  106*  LABPROT 23.5*  --   --  16.5*  --  16.5*  INR 2.2*  --   --  1.4*  --  1.4*  HEPARINUNFRC  --  <0.10* 0.19*  --   --   --   CREATININE 1.36*  --   --  1.31*  --  1.15    Estimated Creatinine Clearance: 39.9 mL/min (by C-G formula based on SCr of 1.15 mg/dL).  Assessment: 42 yom on warfarin PTA for atrial fibrillation and St Jude MVR (INR goal 2.5-3.5) admitted after fall at home. Warfarin on hold in anticipation of surgery for hip fracture, received 5mg  PO vitamin K on 3/1. Patient has hematoma on LUE from fall.  He is now s/p ORIF L femur 3/3 PM. Per Dr. Delfino Lovett (Ortho), restart warfarin at home dose tonight and restart heparin 24 hours post-procedure (planned 3/4 at 1830). Previous heparin rate was 1300 units/hr.  INR stable 1.4 today s/p warfarin home dose resumed last night (received vitamin K 5mg  PO x 1 on 3/1). Hg low stable at 11 (s/p PRBC transfusion on 3/1, 3/2, and 3/3), plt down to 106. SCr trend down to 1.15. No active bleed issues reported.   Goal of Therapy:  Heparin level 0.3-0.7 units/ml  INR 2.5-3.5 Monitor platelets by anticoagulation protocol: Yes   Plan:  No bolus with recent surgery. Start heparin at previous rate 1300 units/hr at 1830 tonight (24hrs post-procedure per Ortho request) Warfarin 5mg  PO x 1 tonight Check 8hr heparin level from resumption Monitor  daily INR/CBC, s/sx bleeding   Arturo Morton, PharmD, BCPS Please check AMION for all [facility] contact numbers Clinical Pharmacist 08/29/2020 10:03 AM

## 2020-08-29 NOTE — Progress Notes (Addendum)
    Subjective:  Patient reports pain as mild to moderate.  Denies N/V/CP/SOB. Patient is beginning therapy at time of exam  Objective:   VITALS:   Vitals:   08/28/20 2150 08/29/20 0147 08/29/20 0550 08/29/20 0759  BP: (!) 142/72 127/67 130/63 128/75  Pulse: (!) 107 93 82 75  Resp:  15 17 18   Temp: 97.7 F (36.5 C) 98.3 F (36.8 C) 98 F (36.7 C) 98 F (36.7 C)  TempSrc: Oral Oral Oral   SpO2: 98% 97% 98% 95%  Weight:      Height:        NAD ABD soft Neurovascular intact Sensation intact distally Intact pulses distally Dorsiflexion/Plantar flexion intact Incision: dressing C/D/I Hemovac in place   Lab Results  Component Value Date   WBC 5.3 08/29/2020   HGB 11.0 (L) 08/29/2020   HCT 32.4 (L) 08/29/2020   MCV 91.0 08/29/2020   PLT 106 (L) 08/29/2020   BMET    Component Value Date/Time   NA 137 08/29/2020 0804   NA 130 10/20/2017 0000   K 4.1 08/29/2020 0804   K 4.2 10/20/2017 0000   CL 104 08/29/2020 0804   CO2 22 08/29/2020 0804   GLUCOSE 134 (H) 08/29/2020 0804   BUN 34 (H) 08/29/2020 0804   BUN 25 (A) 10/20/2017 0000   CREATININE 1.15 08/29/2020 0804   CREATININE 1.38 10/20/2017 0000   CALCIUM 8.0 (L) 08/29/2020 0804   CALCIUM 8.6 10/20/2017 0000   GFRNONAA 59 (L) 08/29/2020 0804   GFRAA >60 10/14/2017 0534     Assessment/Plan: 1 Day Post-Op   Principal Problem:   Closed displaced fracture of distal epiphysis of left femur (HCC) Active Problems:   S/P MVR (mitral valve replacement)   Longstanding persistent atrial fibrillation: CHA2DS2-VASc Score 3   Essential hypertension   Macrocytic anemia   Fall at home, initial encounter   Alcohol use   TDWB LLE with walker DVT ppx: Coumadin, SCDs, TEDS Ok to restart heparin gtt 24 hrs postop WITHOUT bolus D/c heparin gtt when INR >= 2.5 PO pain control PT/OT Dispo: D/C planning, d/c HV drain when output is < 30 cc / shift      Dorothyann Peng 08/29/2020, 9:49 AM [facility]  is now [facility] [address], [place], [place] [address] Phone: [phone] [email or web address] Facebook  [employer]

## 2020-08-29 NOTE — Progress Notes (Signed)
PROGRESS NOTE    ZENITH LAMPHIER   SHF:[id number]  DOB: 08/25/1925  DOA: 08/25/2020     3  PCP: [name], MD  CC: fall at home  Hospital Course: [name] is a 87 yo male with PMH PAF, mechanical MVR (1997, on Coumadin), BPH, OA, previous R/L hip arthroplasties, hypertension who presented to the ER with severe left thigh pain after a fall at home.  Fall was considered mechanical in nature.    Imaging revealed a significant acute spiral fracture of the distal left femoral diaphysis with displacement.  Traction was initiated and he was admitted for further work-up and surgical evaluation.  Coumadin was placed on hold and he was transitioned to a heparin drip when INR fell below 2.5. He also developed a downtrend in hemoglobin and was transfused 2 units PRBC on 08/26/2020. Hemoglobin improved to 8.4 g/dL after transfusion and he was given 2 more units per orthopedic surgery.  Follow-up hemoglobin the following morning was 10.6 g/dL. He underwent surgery on 08/28/2020.  He received 3 additional units PRBC during surgery for intraoperative blood loss.  A Hemovac was also placed. Coumadin was resumed on 08/28/2020 followed by heparin drip 24 hours postop.   Interval History:  No events overnight. Son also bedside this am.  Was about to work with physical therapy.  Hemovac in place with blood noted in bag.  Tolerable pain in left lower extremity with bandage in place.  Mentating well.  ROS: Constitutional: negative for chills and fevers, Respiratory: negative for cough, Cardiovascular: negative for chest pain and Gastrointestinal: negative for abdominal pain  Assessment & Plan: * Closed displaced fracture of distal epiphysis of left femur (HCC) - s/p mechanical fall at home - s/p ORIF on 08/28/20 - post-op rec's per ortho: touchdown weightbearing with walker; unlimited ROM of L knee - PT eval pending  - post-op delirium expected and was explained to son prior to surgery who  understands   S/P MVR (mitral valve replacement) - goal INR 2.5 - 3.5 - coumadin resumed 3/3 post-op; heparin to resume 24 hrs after surgery - continue heparin and coumadin per pharmacy   Macrocytic anemia - iron 60, sat ratio 15%, TIBC ULN (413) - folate 44, B12 478 - continue MVI - Hgb down to 7.6 g/dL on 3/1; no s/s bleeding or compartment syndrome; ortho transfusing 2 units PRBC - repeat Hgb 3/3, 8.4 g/dL on 3/2, transfused 2 more units per ortho - ~1500 cc EBL in surgery, s/p 3 units PRBC - Hgb this am (3/4) is 11 g/dL; vitals are stable; hemovac in place - CBC daily   Alcohol use - no s/s withdrawal - d/c CIWA  Essential hypertension -Continue bisoprolol, Lasix, hydralazine, Imdur  Longstanding persistent atrial fibrillation: CHA2DS2-VASc Score 3 - on Coumadin chronically -Continue bisoprolol  AKI (acute kidney injury) (HCC)-resolved as of 08/27/2020 - baseline creatinine ~ 1.1 - patient presents with increase in creat >0.3 mg/dL above baseline presumed to have occurred within past 7 days PTA - continue IVF - BMP in am   Old records reviewed in assessment of this patient  Antimicrobials: n/a  DVT prophylaxis: SCDs Start: 08/28/20 2026  Coumadin resumed 08/28/2020.  Heparin to be resumed 24-hour postop   Code Status:   Code Status: Full Code Family Communication: son bedside   Disposition Plan: Status is: Observation  The patient will require care spanning > 2 midnights and should be moved to inpatient because: Ongoing active pain requiring inpatient pain management, IV treatments appropriate  due to intensity of illness or inability to take PO and Inpatient level of care appropriate due to severity of illness  Dispo: The patient is from: Home              Anticipated d/c is to: pending PT eval after surgery              Patient currently is not medically stable to d/c.   Difficult to place patient No   Risk of unplanned readmission score: Unplanned Admission-  Pilot do not use: 19.86   Objective: Blood pressure 128/75, pulse 75, temperature 98 F (36.7 C), resp. rate 18, height 6' (1.829 m), weight 70.3 kg, SpO2 95 %.  Examination: General appearance: alert, cooperative and no distress Head: Normocephalic, without obvious abnormality, atraumatic Eyes: EOMI Lungs: clear to auscultation bilaterally Heart: irregularly irregular rhythm and S1, S2 normal Abdomen: normal findings: bowel sounds normal and soft, non-tender Extremities: Left lower extremity wrapped in bandage with Hemovac in place, sanguinous blood noted in bag; compartments soft Skin: mobility and turgor normal Neurologic: LLE deferred but sensation intact in LLE and no other obvious focal deficits   Consultants:   Ortho surgery  Procedures:   ORIF Left femus, 08/28/20  Data Reviewed: I have personally reviewed following labs and imaging studies Results for orders placed or performed during the hospital encounter of 08/25/20 (from the past 24 hour(s))  Prepare RBC (crossmatch)     Status: None   Collection Time: 08/28/20  4:23 PM  Result Value Ref Range   Order Confirmation      ORDER PROCESSED BY BLOOD BANK Performed at [facility], [address] [place], [place] [address]   Prepare RBC (crossmatch)     Status: None   Collection Time: 08/28/20  4:30 PM  Result Value Ref Range   Order Confirmation      ORDER PROCESSED BY BLOOD BANK Performed at [facility], [place] [address] [place], [place] [address]   POCT I-Stat EG7     Status: Abnormal   Collection Time: 08/28/20  5:24 PM  Result Value Ref Range   pH, Ven 7.328 7.250 - 7.430   pCO2, Ven 53.4 44.0 - 60.0 mmHg   pO2, Ven 25.0 (LL) 32.0 - 45.0 mmHg   Bicarbonate 28.0 20.0 - 28.0 mmol/L   TCO2 30 22 - 32 mmol/L   O2 Saturation 40.0 %   Acid-Base Excess 1.0 0.0 - 2.0 mmol/L   Sodium 139 135 - 145 mmol/L   Potassium 4.1 3.5 - 5.1 mmol/L   Calcium, Ion 1.11 (L) 1.15 - 1.40 mmol/L   HCT 35.0 (L) 39.0 -  52.0 %   Hemoglobin 11.9 (L) 13.0 - 17.0 g/dL   Sample type VENOUS    Comment NOTIFIED PHYSICIAN   Basic metabolic panel     Status: Abnormal   Collection Time: 08/29/20  8:04 AM  Result Value Ref Range   Sodium 137 135 - 145 mmol/L   Potassium 4.1 3.5 - 5.1 mmol/L   Chloride 104 98 - 111 mmol/L   CO2 22 22 - 32 mmol/L   Glucose, Bld 134 (H) 70 - 99 mg/dL   BUN 34 (H) 8 - 23 mg/dL   Creatinine, Ser 1.15 0.61 - 1.24 mg/dL   Calcium 8.0 (L) 8.9 - 10.3 mg/dL   GFR, Estimated 59 (L) >60 mL/min   Anion gap 11 5 - 15  CBC with Differential/Platelet     Status: Abnormal   Collection  Time: 08/29/20  8:04 AM  Result Value Ref Range   WBC 5.3 4.0 - 10.5 K/uL   RBC 3.56 (L) 4.22 - 5.81 MIL/uL   Hemoglobin 11.0 (L) 13.0 - 17.0 g/dL   HCT 32.4 (L) 39.0 - 52.0 %   MCV 91.0 80.0 - 100.0 fL   MCH 30.9 26.0 - 34.0 pg   MCHC 34.0 30.0 - 36.0 g/dL   RDW 17.2 (H) 11.5 - 15.5 %   Platelets 106 (L) 150 - 400 K/uL   nRBC 0.4 (H) 0.0 - 0.2 %   Neutrophils Relative % 82 %   Neutro Abs 4.3 1.7 - 7.7 K/uL   Lymphocytes Relative 5 %   Lymphs Abs 0.3 (L) 0.7 - 4.0 K/uL   Monocytes Relative 12 %   Monocytes Absolute 0.7 0.1 - 1.0 K/uL   Eosinophils Relative 0 %   Eosinophils Absolute 0.0 0.0 - 0.5 K/uL   Basophils Relative 0 %   Basophils Absolute 0.0 0.0 - 0.1 K/uL   Immature Granulocytes 1 %   Abs Immature Granulocytes 0.03 0.00 - 0.07 K/uL  Protime-INR     Status: Abnormal   Collection Time: 08/29/20  8:04 AM  Result Value Ref Range   Prothrombin Time 16.5 (H) 11.4 - 15.2 seconds   INR 1.4 (H) 0.8 - 1.2    Recent Results (from the past 240 hour(s))  Resp Panel by RT-PCR (Flu A&B, Covid) Nasopharyngeal Swab     Status: None   Collection Time: 08/25/20  6:32 PM   Specimen: Nasopharyngeal Swab; Nasopharyngeal(NP) swabs in vial transport medium  Result Value Ref Range Status   SARS Coronavirus 2 by RT PCR NEGATIVE NEGATIVE Final    Comment: (NOTE) SARS-CoV-2 target nucleic acids are NOT  DETECTED.  The SARS-CoV-2 RNA is generally detectable in upper respiratory specimens during the acute phase of infection. The lowest concentration of SARS-CoV-2 viral copies this assay can detect is 138 copies/mL. A negative result does not preclude SARS-Cov-2 infection and should not be used as the sole basis for treatment or other patient management decisions. A negative result may occur with  improper specimen collection/handling, submission of specimen other than nasopharyngeal swab, presence of viral mutation(s) within the areas targeted by this assay, and inadequate number of viral copies(<138 copies/mL). A negative result must be combined with clinical observations, patient history, and epidemiological information. The expected result is Negative.  Fact Sheet for Patients:  EntrepreneurPulse.com.au  Fact Sheet for Healthcare Providers:  IncredibleEmployment.be  This test is no t yet approved or cleared by the [place] FDA and  has been authorized for detection and/or diagnosis of SARS-CoV-2 by FDA under an Emergency Use Authorization (EUA). This EUA will remain  in effect (meaning this test can be used) for the duration of the COVID-19 declaration under Section 564(b)(1) of the Act, 21 U.S.C.section 360bbb-3(b)(1), unless the authorization is terminated  or revoked sooner.       Influenza A by PCR NEGATIVE NEGATIVE Final   Influenza B by PCR NEGATIVE NEGATIVE Final    Comment: (NOTE) The Xpert Xpress SARS-CoV-2/FLU/RSV plus assay is intended as an aid in the diagnosis of influenza from Nasopharyngeal swab specimens and should not be used as a sole basis for treatment. Nasal washings and aspirates are unacceptable for Xpert Xpress SARS-CoV-2/FLU/RSV testing.  Fact Sheet for Patients: EntrepreneurPulse.com.au  Fact Sheet for Healthcare Providers: IncredibleEmployment.be  This test is not yet  approved or cleared by the [place] and has been authorized  for detection and/or diagnosis of SARS-CoV-2 by FDA under an Emergency Use Authorization (EUA). This EUA will remain in effect (meaning this test can be used) for the duration of the COVID-19 declaration under Section 564(b)(1) of the Act, 21 U.S.C. section 360bbb-3(b)(1), unless the authorization is terminated or revoked.  Performed at [facility], [place] [address], [place], [place] [address]   Surgical pcr screen     Status: None   Collection Time: 08/26/20  5:49 PM   Specimen: Nasal Mucosa; Nasal Swab  Result Value Ref Range Status   MRSA, PCR NEGATIVE NEGATIVE Final   Staphylococcus aureus NEGATIVE NEGATIVE Final    Comment: (NOTE) The Xpert SA Assay (FDA approved for NASAL specimens in patients 67 years of age and older), is one component of a comprehensive surveillance program. It is not intended to diagnose infection nor to guide or monitor treatment. Performed at [facility], [place] [address] [place],  [address]      Radiology Studies: DG C-Arm 1-60 Min  Result Date: 08/28/2020 CLINICAL DATA:  Surgery, elective. Additional history provided: Left femur. Provided fluoroscopy time 2 minutes, 10 seconds. EXAM: LEFT FEMUR 2 VIEWS; DG C-ARM 1-60 MIN COMPARISON:  Radiographs of the left femur 08/25/2020. FINDINGS: Nine intraoperative fluoroscopic images of the left femur are submitted. The images demonstrate interval ORIF of an acute left femoral diaphyseal fracture utilizing a lateral plate and screws as well as cerclage wires. Alignment is essentially anatomic. Partially imaged left hip arthroplasty. IMPRESSION: Nine intraoperative fluoroscopic images from ORIF of a left femoral diaphyseal fracture, as described. Electronically Signed   By: Kellie Simmering DO   On: 08/28/2020 17:33   DG FEMUR MIN 2 VIEWS LEFT  Result Date: 08/28/2020 CLINICAL DATA:  Surgery, elective. Additional history provided:  Left femur. Provided fluoroscopy time 2 minutes, 10 seconds. EXAM: LEFT FEMUR 2 VIEWS; DG C-ARM 1-60 MIN COMPARISON:  Radiographs of the left femur 08/25/2020. FINDINGS: Nine intraoperative fluoroscopic images of the left femur are submitted. The images demonstrate interval ORIF of an acute left femoral diaphyseal fracture utilizing a lateral plate and screws as well as cerclage wires. Alignment is essentially anatomic. Partially imaged left hip arthroplasty. IMPRESSION: Nine intraoperative fluoroscopic images from ORIF of a left femoral diaphyseal fracture, as described. Electronically Signed   By: Kellie Simmering DO   On: 08/28/2020 17:33   DG FEMUR PORT MIN 2 VIEWS LEFT  Result Date: 08/28/2020 CLINICAL DATA:  Postop fracture EXAM: LEFT FEMUR PORTABLE 2 VIEWS COMPARISON:  08/25/2020 FINDINGS: Previous left hip replacement with normal alignment. Interval surgical plate and multiple screw fixation as well as cerclage wire fixation of distal femoral fracture, now with anatomic alignment. Gas within the soft tissues consistent with recent surgery. Surgical drain within the thigh. IMPRESSION: Interval surgical plate and screw fixation of distal femoral fracture with anatomic alignment. Electronically Signed   By: Donavan Foil M.D.   On: 08/28/2020 19:20   DG FEMUR PORT MIN 2 VIEWS LEFT  Final Result    DG C-Arm 1-60 Min  Final Result    DG FEMUR MIN 2 VIEWS LEFT  Final Result    DG Pelvis 1-2 Views  Final Result    DG FEMUR MIN 2 VIEWS LEFT  Final Result    DG Chest Portable 1 View  Final Result      Scheduled Meds: . bisoprolol  5 mg Oral Daily  . docusate sodium  100 mg Oral BID  . folic acid  1  mg Oral Daily  . furosemide  40 mg Oral Daily  . hydrALAZINE  25 mg Oral Q lunch  . isosorbide mononitrate  30 mg Oral Daily  . multivitamin with minerals  1 tablet Oral Daily  . senna  1 tablet Oral BID  . thiamine  100 mg Oral Daily   Or  . thiamine  100 mg Intravenous Daily  . warfarin   5 mg Oral ONCE-1600  . Warfarin - Pharmacist Dosing Inpatient   Does not apply q1600   PRN Meds: acetaminophen, fluticasone, HYDROcodone-acetaminophen, HYDROcodone-acetaminophen, menthol-cetylpyridinium **OR** phenol, methocarbamol **OR** methocarbamol (ROBAXIN) IV, metoCLOPramide **OR** metoCLOPramide (REGLAN) injection, morphine injection, ondansetron **OR** ondansetron (ZOFRAN) IV, polyethylene glycol Continuous Infusions: . sodium chloride 75 mL/hr at 08/28/20 2053  . heparin    . methocarbamol (ROBAXIN) IV       LOS: 3 days  Time spent: Greater than 50% of the 35 minute visit was spent in counseling/coordination of care for the patient as laid out in the A&P.   Dwyane Dee, MD [facility] 08/29/2020, 1:56 PM

## 2020-08-30 DIAGNOSIS — K5900 Constipation, unspecified: Secondary | ICD-10-CM

## 2020-08-30 DIAGNOSIS — K5901 Slow transit constipation: Secondary | ICD-10-CM

## 2020-08-30 LAB — BASIC METABOLIC PANEL
Anion gap: 6 (ref 5–15)
BUN: 35 mg/dL — ABNORMAL HIGH (ref 8–23)
CO2: 22 mmol/L (ref 22–32)
Calcium: 8.1 mg/dL — ABNORMAL LOW (ref 8.9–10.3)
Chloride: 104 mmol/L (ref 98–111)
Creatinine, Ser: 1.11 mg/dL (ref 0.61–1.24)
GFR, Estimated: >60 mL/min (ref >60)
Glucose, Bld: 123 mg/dL — ABNORMAL HIGH (ref 70–99)
Potassium: 3.9 mmol/L (ref 3.5–5.1)
Sodium: 132 mmol/L — ABNORMAL LOW (ref 135–145)

## 2020-08-30 LAB — HEPARIN LEVEL (UNFRACTIONATED)
Heparin Unfractionated: 0.17 IU/mL — ABNORMAL LOW (ref 0.30–0.70)
Heparin Unfractionated: 0.34 IU/mL (ref 0.30–0.70)

## 2020-08-30 LAB — CBC WITH DIFFERENTIAL/PLATELET
Abs Immature Granulocytes: 0.04 10*3/uL (ref 0.00–0.07)
Basophils Absolute: 0 10*3/uL (ref 0.0–0.1)
Basophils Relative: 0 %
Eosinophils Absolute: 0.1 10*3/uL (ref 0.0–0.5)
Eosinophils Relative: 1 %
HCT: 30.9 % — ABNORMAL LOW (ref 39.0–52.0)
Hemoglobin: 10.7 g/dL — ABNORMAL LOW (ref 13.0–17.0)
Immature Granulocytes: 1 %
Lymphocytes Relative: 8 %
Lymphs Abs: 0.4 10*3/uL — ABNORMAL LOW (ref 0.7–4.0)
MCH: 31.5 pg (ref 26.0–34.0)
MCHC: 34.6 g/dL (ref 30.0–36.0)
MCV: 90.9 fL (ref 80.0–100.0)
Monocytes Absolute: 0.8 10*3/uL (ref 0.1–1.0)
Monocytes Relative: 14 %
Neutro Abs: 4.2 10*3/uL (ref 1.7–7.7)
Neutrophils Relative %: 76 %
Platelets: 121 10*3/uL — ABNORMAL LOW (ref 150–400)
RBC: 3.4 MIL/uL — ABNORMAL LOW (ref 4.22–5.81)
RDW: 17.2 % — ABNORMAL HIGH (ref 11.5–15.5)
WBC: 5.5 10*3/uL (ref 4.0–10.5)
nRBC: 0 % (ref 0.0–0.2)

## 2020-08-30 LAB — PROTIME-INR
INR: 1.6 — ABNORMAL HIGH (ref 0.8–1.2)
Prothrombin Time: 18.4 seconds — ABNORMAL HIGH (ref 11.4–15.2)

## 2020-08-30 LAB — MAGNESIUM: Magnesium: 2.6 mg/dL — ABNORMAL HIGH (ref 1.7–2.4)

## 2020-08-30 MED ORDER — WARFARIN SODIUM 5 MG PO TABS
5.0000 mg | ORAL_TABLET | Freq: Once | ORAL | Status: AC
  Administered 2020-08-30: 5 mg via ORAL
  Filled 2020-08-30: qty 1

## 2020-08-30 NOTE — Progress Notes (Signed)
PROGRESS NOTE    Richard Spears   ASN:[id number]  DOB: [birth year]  DOA: 08/25/2020     4  PCP: Richard Orn, MD  CC: fall at home  Hospital Course: Richard Spears is a 87 yo male with PMH PAF, mechanical MVR (1997, on Coumadin), BPH, OA, previous R/L hip arthroplasties, hypertension who presented to the ER with severe left thigh pain after a fall at home.  Fall was considered mechanical in nature.    Imaging revealed a significant acute spiral fracture of the distal left femoral diaphysis with displacement.  Traction was initiated and he was admitted for further work-up and surgical evaluation.  Coumadin was placed on hold and he was transitioned to a heparin drip when INR fell below 2.5. He also developed a downtrend in hemoglobin and was transfused 2 units PRBC on 08/26/2020. Hemoglobin improved to 8.4 g/dL after transfusion and he was given 2 more units per orthopedic surgery.  Follow-up hemoglobin the following morning was 10.6 g/dL. He underwent surgery on 08/28/2020.  He received 3 additional units PRBC during surgery for intraoperative blood loss.  A Hemovac was also placed. Coumadin was resumed on 08/28/2020 followed by heparin drip 24 hours postop.   Interval History:  Large bowel movement yesterday after magnesium citrate. Laxatives had now been discontinued. Still has ongoing pain in left lower extremity but tolerable. Was evaluated by physical therapy yesterday. Son present again this morning, update given with questions answered.  ROS: Constitutional: negative for chills and fevers, Respiratory: negative for cough, Cardiovascular: negative for chest pain and Gastrointestinal: negative for abdominal pain  Assessment & Plan: * Closed displaced fracture of distal epiphysis of left femur (HCC) - s/p mechanical fall at home - s/p ORIF on 08/28/20 - post-op rec's per ortho: touchdown weightbearing with walker; unlimited ROM of L knee - PT eval pending  - post-op delirium  expected and was explained to son prior to surgery who understands   S/P MVR (mitral valve replacement) - goal INR 2.5 - 3.5 - coumadin resumed 3/3 post-op; heparin to resume 24 hrs after surgery - continue heparin and coumadin per pharmacy   Macrocytic anemia - iron 60, sat ratio 15%, TIBC ULN (413) - folate 44, B12 478 - continue MVI - Hgb down to 7.6 g/dL on 3/1; no s/s bleeding or compartment syndrome; ortho transfusing 2 units PRBC - repeat Hgb 3/3, 8.4 g/dL on 3/2, transfused 2 more units per ortho - ~1500 cc EBL in surgery, s/p 3 units PRBC - Hgb this am (3/5) is 10.7 g/dL; vitals are stable; hemovac in place - CBC daily   Alcohol use - no s/s withdrawal - d/c CIWA  Essential hypertension -Continue bisoprolol, Lasix, hydralazine, Imdur  Longstanding persistent atrial fibrillation: CHA2DS2-VASc Score 3 - on Coumadin chronically -Continue bisoprolol  Constipation-resolved as of 08/30/2020 -Difficulty with constipation during hospitalization and no response to MiraLAX or oral stool softeners -Treated with magnesium citrate on 08/29/2020 followed by large bowel movements -Laxatives have now been discontinued -Monitor bowel movements while still on opiates  AKI (acute kidney injury) (HCC)-resolved as of 08/27/2020 - baseline creatinine ~ 1.1 - patient presents with increase in creat >0.3 mg/dL above baseline presumed to have occurred within past 7 days PTA - continue IVF - BMP in am   Old records reviewed in assessment of this patient  Antimicrobials: n/a  DVT prophylaxis: SCDs Start: 08/28/20 2026  Coumadin resumed 08/28/2020.  Heparin to be resumed 24-hour postop   Code Status:   Code  Status: Full Code Family Communication: son bedside   Disposition Plan: Status is: Observation  The patient will require care spanning > 2 midnights and should be moved to inpatient because: Ongoing active pain requiring inpatient pain management, IV treatments appropriate due to  intensity of illness or inability to take PO and Inpatient level of care appropriate due to severity of illness  Dispo: The patient is from: Home              Anticipated d/c is to: SNF              Patient currently is not medically stable to d/c.   Difficult to place patient No   Risk of unplanned readmission score: Unplanned Admission- Pilot do not use: 18.96   Objective: Blood pressure (!) 144/92, pulse 90, temperature 98 F (36.7 C), temperature source Oral, resp. rate 18, height 6' (1.829 m), weight 70.3 kg, SpO2 95 %.  Examination: General appearance: alert, cooperative and no distress Head: Normocephalic, without obvious abnormality, atraumatic Eyes: EOMI Lungs: clear to auscultation bilaterally Heart: irregularly irregular rhythm and S1, S2 normal Abdomen: normal findings: bowel sounds normal and soft, non-tender Extremities: Left lower extremity wrapped in bandage with Hemovac in place, sanguinous blood noted in bag; compartments soft Skin: Skin flaking and severely dried skin with dark pigmentation noted in right lower extremity Neurologic: LLE deferred but sensation intact in LLE and no other obvious focal deficits   Consultants:   Ortho surgery  Procedures:   ORIF Left femus, 08/28/20  Data Reviewed: I have personally reviewed following labs and imaging studies Results for orders placed or performed during the hospital encounter of 08/25/20 (from the past 24 hour(s))  Heparin level (unfractionated)     Status: Abnormal   Collection Time: 08/30/20  2:14 AM  Result Value Ref Range   Heparin Unfractionated 0.17 (L) 0.30 - 0.70 IU/mL  Basic metabolic panel     Status: Abnormal   Collection Time: 08/30/20  2:14 AM  Result Value Ref Range   Sodium 132 (L) 135 - 145 mmol/L   Potassium 3.9 3.5 - 5.1 mmol/L   Chloride 104 98 - 111 mmol/L   CO2 22 22 - 32 mmol/L   Glucose, Bld 123 (H) 70 - 99 mg/dL   BUN 35 (H) 8 - 23 mg/dL   Creatinine, Ser 1.11 0.61 - 1.24 mg/dL    Calcium 8.1 (L) 8.9 - 10.3 mg/dL   GFR, Estimated >60 >60 mL/min   Anion gap 6 5 - 15  CBC with Differential/Platelet     Status: Abnormal   Collection Time: 08/30/20  2:14 AM  Result Value Ref Range   WBC 5.5 4.0 - 10.5 K/uL   RBC 3.40 (L) 4.22 - 5.81 MIL/uL   Hemoglobin 10.7 (L) 13.0 - 17.0 g/dL   HCT 30.9 (L) 39.0 - 52.0 %   MCV 90.9 80.0 - 100.0 fL   MCH 31.5 26.0 - 34.0 pg   MCHC 34.6 30.0 - 36.0 g/dL   RDW 17.2 (H) 11.5 - 15.5 %   Platelets 121 (L) 150 - 400 K/uL   nRBC 0.0 0.0 - 0.2 %   Neutrophils Relative % 76 %   Neutro Abs 4.2 1.7 - 7.7 K/uL   Lymphocytes Relative 8 %   Lymphs Abs 0.4 (L) 0.7 - 4.0 K/uL   Monocytes Relative 14 %   Monocytes Absolute 0.8 0.1 - 1.0 K/uL   Eosinophils Relative 1 %   Eosinophils Absolute 0.1 0.0 -  0.5 K/uL   Basophils Relative 0 %   Basophils Absolute 0.0 0.0 - 0.1 K/uL   Immature Granulocytes 1 %   Abs Immature Granulocytes 0.04 0.00 - 0.07 K/uL  Protime-INR     Status: Abnormal   Collection Time: 08/30/20  2:14 AM  Result Value Ref Range   Prothrombin Time 18.4 (H) 11.4 - 15.2 seconds   INR 1.6 (H) 0.8 - 1.2  Magnesium     Status: Abnormal   Collection Time: 08/30/20  2:14 AM  Result Value Ref Range   Magnesium 2.6 (H) 1.7 - 2.4 mg/dL  Heparin level (unfractionated)     Status: None   Collection Time: 08/30/20  1:13 PM  Result Value Ref Range   Heparin Unfractionated 0.34 0.30 - 0.70 IU/mL    Recent Results (from the past 240 hour(s))  Resp Panel by RT-PCR (Flu A&B, Covid) Nasopharyngeal Swab     Status: None   Collection Time: 08/25/20  6:32 PM   Specimen: Nasopharyngeal Swab; Nasopharyngeal(NP) swabs in vial transport medium  Result Value Ref Range Status   SARS Coronavirus 2 by RT PCR NEGATIVE NEGATIVE Final    Comment: (NOTE) SARS-CoV-2 target nucleic acids are NOT DETECTED.  The SARS-CoV-2 RNA is generally detectable in upper respiratory specimens during the acute phase of infection. The lowest concentration of  SARS-CoV-2 viral copies this assay can detect is 138 copies/mL. A negative result does not preclude SARS-Cov-2 infection and should not be used as the sole basis for treatment or other patient management decisions. A negative result may occur with  improper specimen collection/handling, submission of specimen other than nasopharyngeal swab, presence of viral mutation(s) within the areas targeted by this assay, and inadequate number of viral copies(<138 copies/mL). A negative result must be combined with clinical observations, patient history, and epidemiological information. The expected result is Negative.  Fact Sheet for Patients:  EntrepreneurPulse.com.au  Fact Sheet for Healthcare Providers:  IncredibleEmployment.be  This test is no t yet approved or cleared by the [place] FDA and  has been authorized for detection and/or diagnosis of SARS-CoV-2 by FDA under an Emergency Use Authorization (EUA). This EUA will remain  in effect (meaning this test can be used) for the duration of the COVID-19 declaration under Section 564(b)(1) of the Act, 21 U.S.C.section 360bbb-3(b)(1), unless the authorization is terminated  or revoked sooner.       Influenza A by PCR NEGATIVE NEGATIVE Final   Influenza B by PCR NEGATIVE NEGATIVE Final    Comment: (NOTE) The Xpert Xpress SARS-CoV-2/FLU/RSV plus assay is intended as an aid in the diagnosis of influenza from Nasopharyngeal swab specimens and should not be used as a sole basis for treatment. Nasal washings and aspirates are unacceptable for Xpert Xpress SARS-CoV-2/FLU/RSV testing.  Fact Sheet for Patients: EntrepreneurPulse.com.au  Fact Sheet for Healthcare Providers: IncredibleEmployment.be  This test is not yet approved or cleared by the [place] FDA and has been authorized for detection and/or diagnosis of SARS-CoV-2 by FDA under an Emergency Use  Authorization (EUA). This EUA will remain in effect (meaning this test can be used) for the duration of the COVID-19 declaration under Section 564(b)(1) of the Act, 21 U.S.C. section 360bbb-3(b)(1), unless the authorization is terminated or revoked.  Performed at [facility], [place] [address] [place], [place] [address]   Surgical pcr screen     Status: None   Collection Time: 08/26/20  5:49 PM   Specimen: Nasal Mucosa; Nasal Swab  Result Value Ref  Range Status   MRSA, PCR NEGATIVE NEGATIVE Final   Staphylococcus aureus NEGATIVE NEGATIVE Final    Comment: (NOTE) The Xpert SA Assay (FDA approved for NASAL specimens in patients 6 years of age and older), is one component of a comprehensive surveillance program. It is not intended to diagnose infection nor to guide or monitor treatment. Performed at [facility], [place] [address] [place], [place] [address]      Radiology Studies: DG C-Arm 1-60 Min  Result Date: 08/28/2020 CLINICAL DATA:  Surgery, elective. Additional history provided: Left femur. Provided fluoroscopy time 2 minutes, 10 seconds. EXAM: LEFT FEMUR 2 VIEWS; DG C-ARM 1-60 MIN COMPARISON:  Radiographs of the left femur 08/25/2020. FINDINGS: Nine intraoperative fluoroscopic images of the left femur are submitted. The images demonstrate interval ORIF of an acute left femoral diaphyseal fracture utilizing a lateral plate and screws as well as cerclage wires. Alignment is essentially anatomic. Partially imaged left hip arthroplasty. IMPRESSION: Nine intraoperative fluoroscopic images from ORIF of a left femoral diaphyseal fracture, as described. Electronically Signed   By: Kellie Simmering DO   On: 08/28/2020 17:33   DG FEMUR MIN 2 VIEWS LEFT  Result Date: 08/28/2020 CLINICAL DATA:  Surgery, elective. Additional history provided: Left femur. Provided fluoroscopy time 2 minutes, 10 seconds. EXAM: LEFT FEMUR 2 VIEWS; DG C-ARM 1-60 MIN COMPARISON:  Radiographs of the left femur  08/25/2020. FINDINGS: Nine intraoperative fluoroscopic images of the left femur are submitted. The images demonstrate interval ORIF of an acute left femoral diaphyseal fracture utilizing a lateral plate and screws as well as cerclage wires. Alignment is essentially anatomic. Partially imaged left hip arthroplasty. IMPRESSION: Nine intraoperative fluoroscopic images from ORIF of a left femoral diaphyseal fracture, as described. Electronically Signed   By: Kellie Simmering DO   On: 08/28/2020 17:33   DG FEMUR PORT MIN 2 VIEWS LEFT  Result Date: 08/28/2020 CLINICAL DATA:  Postop fracture EXAM: LEFT FEMUR PORTABLE 2 VIEWS COMPARISON:  08/25/2020 FINDINGS: Previous left hip replacement with normal alignment. Interval surgical plate and multiple screw fixation as well as cerclage wire fixation of distal femoral fracture, now with anatomic alignment. Gas within the soft tissues consistent with recent surgery. Surgical drain within the thigh. IMPRESSION: Interval surgical plate and screw fixation of distal femoral fracture with anatomic alignment. Electronically Signed   By: Donavan Foil M.D.   On: 08/28/2020 19:20   DG FEMUR PORT MIN 2 VIEWS LEFT  Final Result    DG C-Arm 1-60 Min  Final Result    DG FEMUR MIN 2 VIEWS LEFT  Final Result    DG Pelvis 1-2 Views  Final Result    DG FEMUR MIN 2 VIEWS LEFT  Final Result    DG Chest Portable 1 View  Final Result      Scheduled Meds: . bisoprolol  5 mg Oral Daily  . docusate sodium  100 mg Oral BID  . folic acid  1 mg Oral Daily  . furosemide  40 mg Oral Daily  . hydrALAZINE  25 mg Oral Q lunch  . isosorbide mononitrate  30 mg Oral Daily  . multivitamin with minerals  1 tablet Oral Daily  . thiamine  100 mg Oral Daily   Or  . thiamine  100 mg Intravenous Daily  . Warfarin - Pharmacist Dosing Inpatient   Does not apply q1600   PRN Meds: acetaminophen, fluticasone, HYDROcodone-acetaminophen, menthol-cetylpyridinium **OR** phenol, methocarbamol  **OR** [DISCONTINUED] methocarbamol (ROBAXIN) IV, metoCLOPramide **OR** metoCLOPramide (REGLAN) injection, ondansetron **  OR** ondansetron (ZOFRAN) IV Continuous Infusions: . sodium chloride 75 mL/hr at 08/28/20 2053  . heparin 1,500 Units/hr (08/30/20 1224)     LOS: 4 days  Time spent: Greater than 50% of the 35 minute visit was spent in counseling/coordination of care for the patient as laid out in the A&P.   Dwyane Dee, MD [facility] 08/30/2020, 2:09 PM

## 2020-08-30 NOTE — NC FL2 (Signed)
[place] MEDICAID FL2 LEVEL OF CARE SCREENING TOOL     IDENTIFICATION  Patient Name: Richard Spears Birthdate: April 18, 1926 Sex: male Admission Date (Current Location): 08/25/2020  [facility] and [place] Number:  Herbalist and Address:  [facility] [place]. [facility] [place] [address], [place],  [address]      Provider Number: [id number]  Attending Physician Name and Address:  Dwyane Dee, MD  Relative Name and Phone Number:  Antwine Agosto    Current Level of Care: Hospital Recommended Level of Care: [place] Prior Approval Number:    Date Approved/Denied:   PASRR Number: [id number]  Discharge Plan:      Current Diagnoses: Patient Active Problem List   Diagnosis Date Noted  . Closed displaced fracture of distal epiphysis of left femur ([place]) 08/25/2020  . Macrocytic anemia 08/25/2020  . Fall at home, initial encounter 08/25/2020  . Alcohol use 08/25/2020  . 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 Hip fracture ([place]) 10/09/2017  . Essential hypertension 05/12/2017  . Exertional dyspnea 01/18/2016  . Venous insufficiency 02/14/2015  . Cancer of skin of right lower leg 10/31/2014  . AI (aortic insufficiency) 01/23/2014  . Longstanding persistent atrial fibrillation: CHA2DS2-VASc Score 3 01/17/2014  . Hyperlipidemia   . S/P MVR (mitral valve replacement) 04/08/1996    Orientation RESPIRATION BLADDER Height & Weight     Self,Time,Situation,Place  Normal Incontinent,External catheter Weight: 155 lb (70.3 kg) Height:  6' (182.9 cm)  BEHAVIORAL SYMPTOMS/MOOD NEUROLOGICAL BOWEL NUTRITION STATUS      Continent Diet (see dc summary)  AMBULATORY STATUS COMMUNICATION OF NEEDS Skin   Extensive Assist Verbally Surgical wounds (Closed incision, left thigh 08/28/20)                       Personal Care Assistance Level of Assistance  Bathing,Feeding,Dressing Bathing Assistance: Limited assistance Feeding assistance:  Independent Dressing Assistance: Limited assistance     Functional Limitations Info  Sight,Hearing,Speech Sight Info: Adequate Hearing Info: Adequate Speech Info: Adequate    SPECIAL CARE FACTORS FREQUENCY  PT (By licensed PT),OT (By licensed OT)     PT Frequency: 5x week OT Frequency: 5x week            Contractures Contractures Info: Not present    Additional Factors Info  Code Status,Allergies Code Status Info: Full Allergies Info: Flexeril           Current Medications (08/30/2020):  This is the current hospital active medication list Current Facility-Administered Medications  Medication Dose Route Frequency Provider Last Rate Last Admin  . acetaminophen (TYLENOL) tablet 325-650 mg  325-650 mg Oral Q6H PRN Rod Can, MD   650 mg at 08/30/20 1240  . bisoprolol (ZEBETA) tablet 5 mg  5 mg Oral Daily Swinteck, Aaron Edelman, MD   5 mg at 08/30/20 1234  . docusate sodium (COLACE) capsule 100 mg  100 mg Oral BID Rod Can, MD   100 mg at 08/29/20 2136  . fluticasone (FLONASE) 50 MCG/ACT nasal spray 1 spray  1 spray Each Nare Daily PRN Swinteck, Aaron Edelman, MD      . folic acid (FOLVITE) tablet 1 mg  1 mg Oral Daily Swinteck, Aaron Edelman, MD   1 mg at 08/30/20 1234  . furosemide (LASIX) tablet 40 mg  40 mg Oral Daily Swinteck, Aaron Edelman, MD   40 mg at 08/30/20 1234  . heparin ADULT infusion 100 units/mL (25000 units/224mL)  1,500 Units/hr Intravenous Continuous Laren Everts, RPH 15 mL/hr  at 08/30/20 1224 1,500 Units/hr at 08/30/20 1224  . hydrALAZINE (APRESOLINE) tablet 25 mg  25 mg Oral Q lunch Rod Can, MD   25 mg at 08/30/20 1234  . HYDROcodone-acetaminophen (NORCO/VICODIN) 5-325 MG per tablet 1-2 tablet  1-2 tablet Oral Q4H PRN Rod Can, MD   1 tablet at 08/29/20 0120  . isosorbide mononitrate (IMDUR) 24 hr tablet 30 mg  30 mg Oral Daily Rod Can, MD   30 mg at 08/30/20 1234  . menthol-cetylpyridinium (CEPACOL) lozenge 3 mg  1 lozenge Oral PRN Swinteck, Aaron Edelman,  MD       Or  . phenol (CHLORASEPTIC) mouth spray 1 spray  1 spray Mouth/Throat PRN Swinteck, Aaron Edelman, MD      . methocarbamol (ROBAXIN) tablet 500 mg  500 mg Oral Q6H PRN Swinteck, Aaron Edelman, MD      . metoCLOPramide (REGLAN) tablet 5-10 mg  5-10 mg Oral Q8H PRN Swinteck, Aaron Edelman, MD       Or  . metoCLOPramide (REGLAN) injection 5-10 mg  5-10 mg Intravenous Q8H PRN Swinteck, Aaron Edelman, MD      . multivitamin with minerals tablet 1 tablet  1 tablet Oral Daily Rod Can, MD   1 tablet at 08/30/20 1234  . ondansetron (ZOFRAN) tablet 4 mg  4 mg Oral Q6H PRN Swinteck, Aaron Edelman, MD       Or  . ondansetron [facility]) injection 4 mg  4 mg Intravenous Q6H PRN Swinteck, Aaron Edelman, MD      . thiamine tablet 100 mg  100 mg Oral Daily Swinteck, Brian, MD   100 mg at 08/30/20 1234   Or  . thiamine (B-1) injection 100 mg  100 mg Intravenous Daily Swinteck, Aaron Edelman, MD      . warfarin (COUMADIN) tablet 5 mg  5 mg Oral ONCE-1600 Ste[place]wyk, Yujing Z, RPH      . Warfarin - Pharmacist Dosing Inpatient   Does not apply q1600 Kris Mouton [facility]   Given at 08/29/20 4967     Discharge Medications: Please see discharge summary for a list of discharge medications.  Relevant Imaging Results:  Relevant Lab Results:   Additional Information    Cherish B Dargan, LCSWA

## 2020-08-30 NOTE — Evaluation (Signed)
Occupational Therapy Evaluation Patient Details Name: Richard Spears MRN: [id number] DOB: May 07, 1926 Today's Date: 08/30/2020    History of Present Illness The pt is a 87 yo male presenting 2/28 after sustaining a fall outside. The pt was found to have a spiral fx of the distal L femur and is now s/p ORIF of L femur 3/3. PMH includes: afib, BPH, OA, HTN, mechanical mitral valve replacement, and R/L hip arthroplasties.   Clinical Impression   Pt presents with decline in function and safety with ADLs and ADL mobility with impaired strength, balance and endurance. PTA, pt lived at [place] with his wife and was Ind with ADLs and ADL mobility using no AD, was driving. Pt currently requires max A to sit EOB, max A to stand from EOB to RW (pt unable to maintain PWB 30%), max - total A with LB ADLs/selfcare. Pt would benefit from acute OT services to maximize level of function and safety    Follow Up Recommendations  SNF    Equipment Recommendations  3 in 1 bedside commode;Other (comment) (RW, TBD at [facility])    Recommendations for Other Services       Precautions / Restrictions Precautions Precautions: Fall Precaution Comments: pt admitted for a fall Restrictions Weight Bearing Restrictions: Yes LLE Weight Bearing: Partial weight bearing LLE Partial Weight Bearing Percentage or Pounds: 30%      Mobility Bed Mobility Overal bed mobility: Needs Assistance Bed Mobility: Supine to Sit;Sit to Supine     Supine to sit: Max assist;HOB elevated Sit to supine: Max assist;HOB elevated   General bed mobility comments: max A with L LE to EOB and off EOB, max A to elevate trunk, used rails    Transfers Overall transfer level: Needs assistance Equipment used: Rolling walker (2 wheeled) Transfers: Sit to/from Stand Sit to Stand: Max assist;From elevated surface         General transfer comment: unable to attempt SPT as pt unable to maintain PWB 30%, pain and started having a  BM    Balance Overall balance assessment: Needs assistance Sitting-balance support: Bilateral upper extremity supported;Feet supported Sitting balance-Leahy Scale: Fair     Standing balance support: Bilateral upper extremity supported;During functional activity Standing balance-Leahy Scale: Poor                             ADL either performed or assessed with clinical judgement   ADL Overall ADL's : Needs assistance/impaired Eating/Feeding: Set up;Independent;Sitting;Bed level   Grooming: Wash/dry hands;Wash/dry face;Min guard;Sitting   Upper Body Bathing: Min guard;Sitting   Lower Body Bathing: Maximal assistance;Sitting/lateral leans   Upper Body Dressing : Min guard;Sitting   Lower Body Dressing: Total assistance     Toilet Transfer Details (indicate cue type and reason): unable Toileting- Clothing Manipulation and Hygiene: Total assistance;Bed level               Vision Baseline Vision/History: Wears glasses Patient Visual Report: No change from baseline       Perception     Praxis      Pertinent Vitals/Pain Pain Assessment: Faces Faces Pain Scale: Hurts whole lot Pain Location: during bed mobility, sit - stand Pain Descriptors / Indicators: Guarding;Grimacing;Moaning Pain Intervention(s): Limited activity within patient's tolerance;Monitored during session;Premedicated before session;Repositioned     Hand Dominance Right   Extremity/Trunk Assessment Upper Extremity Assessment Upper Extremity Assessment: Generalized weakness   Lower Extremity Assessment Lower Extremity Assessment: Defer to PT evaluation  Cervical / Trunk Assessment Cervical / Trunk Assessment: Normal   Communication Communication Communication: No difficulties   Cognition Arousal/Alertness: Awake/alert Behavior During Therapy: WFL for tasks assessed/performed Overall Cognitive Status: Impaired/Different from baseline Area of Impairment:  Memory;Safety/judgement;Problem solving                     Memory: Decreased recall of precautions   Safety/Judgement: Decreased awareness of safety   Problem Solving: Decreased initiation;Difficulty sequencing;Requires verbal cues General Comments: verbal cues for WB status repeated through session with pt unable to maintain.   General Comments       Exercises     Shoulder Instructions      Home Living Family/patient expects to be discharged to:: Private residence Living Arrangements: Spouse/significant other Available Help at Discharge: Family;Other (Comment) (pt lives with wife and so from [place]. visiting for a few weeks) Type of Home: Apartment Home Access: Level entry;Elevator     Home Layout: One level     Bathroom Shower/Tub: Tub/shower unit;Walk-in shower         Home Equipment: Shower seat;Shower seat - built in;Grab bars - toilet;Hand held shower head;Grab bars - tub/shower;Walker - 2 wheels;Cane - single point   Additional Comments: at [place], cleaners 1x monthly, get one meal provided each day      Prior Functioning/Environment      ADL's / Homemaking Assistance Needed: Ind with ADLs/selfcare, was driving   Comments: pt previously independent, driving, able to mobilize without AD, but would carry walking stick for longer distances outside.        OT Problem List: Impaired balance (sitting and/or standing);Decreased strength;Decreased knowledge of precautions;Pain;Decreased safety awareness;Decreased activity tolerance;Decreased coordination;Decreased knowledge of use of DME or AE      OT Treatment/Interventions: Self-care/ADL training;Therapeutic exercise;Patient/family education;Balance training;Therapeutic activities;DME and/or AE instruction    OT Goals(Current goals can be found in the care plan section) Acute Rehab OT Goals Patient Stated Goal: return home OT Goal Formulation: With patient/family Time For Goal Achievement:  09/13/20 Potential to Achieve Goals: Good ADL Goals Pt Will Perform Grooming: with supervision;with set-up;sitting Pt Will Perform Upper Body Bathing: with supervision;with set-up;sitting Pt Will Perform Lower Body Bathing: with mod assist;sitting/lateral leans Pt Will Perform Upper Body Dressing: with supervision;with set-up;sitting Pt Will Transfer to Toilet: with mod assist  OT Frequency: Min 2X/week   Barriers to D/C:            Co-evaluation              AM-PAC OT "6 Clicks" Daily Activity     Outcome Measure Help from another person eating meals?: None Help from another person taking care of personal grooming?: A Little Help from another person toileting, which includes using toliet, bedpan, or urinal?: Total Help from another person bathing (including washing, rinsing, drying)?: A Lot Help from another person to put on and taking off regular upper body clothing?: A Little Help from another person to put on and taking off regular lower body clothing?: Total 6 Click Score: 14   End of Session Equipment Utilized During Treatment: Gait belt;Rolling walker Nurse Communication: Mobility status  Activity Tolerance: Patient limited by pain Patient left: in bed;with call bell/phone within reach;with bed alarm set  OT Visit Diagnosis: Unsteadiness on feet (R26.81);Other abnormalities of gait and mobility (R26.89);History of falling (Z91.81);Muscle weakness (generalized) (M62.81);Pain Pain - Right/Left: Left Pain - part of body: Leg                Time:  1006-1050 OT Time Calculation (min): 44 min Charges:  OT General Charges $OT Visit: 1 Visit OT Evaluation $OT Eval Moderate Complexity: 1 Mod OT Treatments $Self Care/Home Management : 8-22 mins $Therapeutic Activity: 8-22 mins    Britt Bottom 08/30/2020, 12:29 PM

## 2020-08-30 NOTE — Assessment & Plan Note (Signed)
-  Difficulty with constipation during hospitalization and no response to MiraLAX or oral stool softeners -Treated with magnesium citrate on 08/29/2020 followed by large bowel movements -Laxatives have now been discontinued -Monitor bowel movements while still on opiates

## 2020-08-30 NOTE — Progress Notes (Signed)
ANTICOAGULATION CONSULT NOTE - Follow Up Consult  Pharmacy Consult for Heparin, warfarin  Indication: Atrial Fibrillation, St Jude mech MVR  Allergies  Allergen Reactions  . Flexeril [Cyclobenzaprine] Diarrhea   Heparin Dosing Weight: 70.3 kg  Vital Signs: Temp: 98 F (36.7 C) (03/05 0900) Temp Source: Oral (03/05 0900) BP: 144/92 (03/05 0900) Pulse Rate: 90 (03/05 0900)  Labs: Recent Labs    08/27/20 2237 08/28/20 0709 08/28/20 1724 08/29/20 0804 08/30/20 0214 08/30/20 1313  HGB  --  10.6* 11.9* 11.0* 10.7*  --   HCT  --  31.6* 35.0* 32.4* 30.9*  --   PLT  --  115*  --  106* 121*  --   LABPROT  --  16.5*  --  16.5* 18.4*  --   INR  --  1.4*  --  1.4* 1.6*  --   HEPARINUNFRC 0.19*  --   --   --  0.17* 0.34  CREATININE  --  1.31*  --  1.15 1.11  --     Estimated Creatinine Clearance: 41.3 mL/min (by C-G formula based on SCr of 1.11 mg/dL).  Assessment: 31 yom on warfarin PTA for atrial fibrillation and St Jude MVR (INR goal 2.5-3.5) admitted after fall at home. Warfarin on hold in anticipation of surgery for hip fracture, received 5mg  PO vitamin K on 3/1. Patient has hematoma on LUE from fall. He is now s/p ORIF L femur 3/3 PM. Pharmacy consulted to restart warfarin and bridge with heparin infusion until INR therapeutic.  INR subtherapeutic at 1.6 today after resuming home dose. Heparin level is therapeutic (0.34). Hg low stable at 10.7, Plt low stable at 121. No active bleed issues reported.  PTA warfarin dose is 5 mg daily.  Goal of Therapy:  Heparin level 0.3-0.7 units/ml  INR 2.5-3.5 Monitor platelets by anticoagulation protocol: Yes   Plan:  Warfarin 5 mg PO x 1 tonight Continue heparin infusion at 1500 units/hr Monitor daily heparin level, INR/CBC, s/sx bleeding   Berenice Bouton, PharmD, BCPS PGY2 Pharmacy Resident Phone between 7 am - 3:30 pm: [phone]  Please check AMION for all [facility] phone numbers After 10:00 PM, call [place] [phone]   08/30/2020 2:03 PM

## 2020-08-30 NOTE — Progress Notes (Signed)
ANTICOAGULATION CONSULT NOTE - Follow Up Consult  Pharmacy Consult for heparin Indication: Afib/MVR  Labs: Recent Labs    08/27/20 1416 08/27/20 2237 08/28/20 0709 08/28/20 1724 08/29/20 0804 08/30/20 0214  HGB 8.4*  --  10.6* 11.9* 11.0* 10.7*  HCT 24.8*  --  31.6* 35.0* 32.4* 30.9*  PLT  --   --  115*  --  106* 121*  LABPROT  --   --  16.5*  --  16.5* 18.4*  INR  --   --  1.4*  --  1.4* 1.6*  HEPARINUNFRC <0.10* 0.19*  --   --   --  0.17*  CREATININE  --   --  1.31*  --  1.15 1.11    Assessment: 87yo male subtherapeutic on heparin after resumed s/p ORIF; no gtt issues or signs of bleeding per RN.  Goal of Therapy:  Heparin level 0.3-0.7 units/ml   Plan:  Will increase heparin gtt by 2-3 units/kg/hr to 1500 units/hr and check level in 8 hours.    Wynona Neat, PharmD, BCPS  08/30/2020,3:23 AM

## 2020-08-30 NOTE — Plan of Care (Signed)
No acute changes since the previous night that I took care of him. He is more alert and oriented than the previous night. Pain controlled with PO Tylenol as ordered prn. Heparin drip infusing at 15 cc/hr, INR at 1.6. After magnesium citrate intake, patient has had 2 bms this morning. NAD otherwise. Will continue to monitor and continue current POC.

## 2020-08-30 NOTE — Progress Notes (Addendum)
    Subjective: 2 Days Post-Op Procedure(s) (LRB): OPEN REDUCTION INTERNAL FIXATION (ORIF) DISTAL FEMUR FRACTURE (Left) Patient reports pain as 3 on 0-10 scale.   Denies CP or SOB.  Voiding without difficulty. Positive flatus. Objective: Vital signs in last 24 hours: Temp:  [97.9 F (36.6 C)-98.2 F (36.8 C)] 98.1 F (36.7 C) (03/05 0300) Pulse Rate:  [68-94] 94 (03/05 0300) Resp:  [17-18] 18 (03/05 0300) BP: (115-129)/(57-78) 129/78 (03/05 0300) SpO2:  [94 %-96 %] 96 % (03/05 0300)  Intake/Output from previous day: 03/04 0701 - 03/05 0700 In: 575.1 [P.O.:240; I.V.:335.1] Out: 1100 [Urine:1000; Drains:100] Intake/Output this shift: No intake/output data recorded.  Labs: Recent Labs    08/27/20 1416 08/28/20 0709 08/28/20 1724 08/29/20 0804 08/30/20 0214  HGB 8.4* 10.6* 11.9* 11.0* 10.7*   Recent Labs    08/29/20 0804 08/30/20 0214  WBC 5.3 5.5  RBC 3.56* 3.40*  HCT 32.4* 30.9*  PLT 106* 121*   Recent Labs    08/29/20 0804 08/30/20 0214  NA 137 132*  K 4.1 3.9  CL 104 104  CO2 22 22  BUN 34* 35*  CREATININE 1.15 1.11  GLUCOSE 134* 123*  CALCIUM 8.0* 8.1*   Recent Labs    08/29/20 0804 08/30/20 0214  INR 1.4* 1.6*    Physical Exam: ABD soft Intact pulses distally Compartment soft Body mass index is 21.02 kg/m.  Dressing intact - no compromise to Aquacel    Assessment/Plan: 2 Days Post-Op Procedure(s) (LRB): OPEN REDUCTION INTERNAL FIXATION (ORIF) DISTAL FEMUR FRACTURE (Left) Patient with baseline confusion. Significant BM with gross contamination of the entire bed.  Aquacel intact no evidence of wound contamination. TCWB with therapy Slight decrease in HCT - will monitor labs. D/C bowel meds  Dahlia Bailiff for Dr. Melina Schools [facility] [phone] 08/30/2020, 6:35 AM

## 2020-08-30 NOTE — Progress Notes (Signed)
   08/30/20 0240 08/30/20 0357 08/30/20 0630  Unmeasured Output  Stool Occurrence 1 1 3    0630 While I was tending to another patient in their room, Dr. Rolena Infante brought it to my attention that this patient was in bed with stool all in the bed. I stepped out and explained to him that he had magnesium citrate last night (per my previous note) to help move his bowels and that we have been changing him each time. I also said that I reinforced the ACE wrap and some of the padding at 0240 since it was soiled. I asked Dr. Rolena Infante did he want me to take the dressing down when we clean him up and he said yes. Went in patient's room with Blanch Media, RN and Knapp, nurse tech and as we were cleaning him, he had three more loose BMs. Removed ACE wrap and padding and the two Aquacels to left hip were clean dry and intact. Patient changed and cleaned up. Sheets protected with bed pads. Patient's bed down to the lowest position, telemetry box placed back on patient, and bed alarm on.

## 2020-08-30 NOTE — TOC Initial Note (Signed)
Transition of Care [facility]) - Initial/Assessment Note    Patient Details  Name: Richard Spears MRN: [id number] Date of Birth: 18-Jun-1926  [facility] of [facility]) CM/SW Contact:    [address], [place] Phone Number: 08/30/2020, 3:50 PM  Clinical Narrative:                 CSW met with pt and son at bedside. Pt is from [facility]. Pt's son will look into pt completing rehab at [facility] but are concerned that ongoing construction at the facility and possible dissatisfaction with a previous rehab stay. Pt's son provided with Medicare.gov information to research facilities. Pt's son states he would like time to research facilities at this time and reach out to [facility] directly before [facility] faxes out. Pt's son requests to be present when SNF options are discussed with pt. Pt son can be reached at [phone]. Pt has both vaccines and the booster.         Patient Goals and CMS Choice        Expected Discharge Plan and Services                                                Prior Living Arrangements/Services                       Activities of Daily Living      Permission Sought/Granted                  Emotional Assessment              Admission diagnosis:  Fall [W19.XXXA] Fall, initial encounter [id number]] Displaced oblique fracture of shaft of left femur, initial encounter for closed fracture ([place]) [S72.332A] Closed fracture of left femur, unspecified fracture morphology, initial encounter ([place]) [S72.92XA] Closed displaced fracture of distal epiphysis of left femur [facility]) [S72.442A] Patient Active Problem List   Diagnosis Date Noted  . Closed displaced fracture of distal epiphysis of left femur ([place]) 08/25/2020  . Macrocytic anemia 08/25/2020  . Fall at home, initial encounter 08/25/2020  . Alcohol use 08/25/2020  . 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 Hip fracture ([place]) 10/09/2017  . Essential hypertension 05/12/2017   . Exertional dyspnea 01/18/2016  . Venous insufficiency 02/14/2015  . Cancer of skin of right lower leg 10/31/2014  . AI (aortic insufficiency) 01/23/2014  . Longstanding persistent atrial fibrillation: CHA2DS2-VASc Score 3 01/17/2014  . Hyperlipidemia   . S/P MVR (mitral valve replacement) 04/08/1996   PCP:  [name], MD Pharmacy:   [facility] [place] [address] [place] [address] Phone: [phone] Fax: [phone]  [place], [place] [address] Phone: [phone] Fax: [phone]     Social Determinants of Health (SDOH) Interventions    Readmission Risk Interventions No flowsheet data found.

## 2020-08-31 LAB — CBC WITH DIFFERENTIAL/PLATELET
Abs Immature Granulocytes: 0.08 10*3/uL — ABNORMAL HIGH (ref 0.00–0.07)
Basophils Absolute: 0 10*3/uL (ref 0.0–0.1)
Basophils Relative: 1 %
Eosinophils Absolute: 0.2 10*3/uL (ref 0.0–0.5)
Eosinophils Relative: 3 %
HCT: 31.3 % — ABNORMAL LOW (ref 39.0–52.0)
Hemoglobin: 10.4 g/dL — ABNORMAL LOW (ref 13.0–17.0)
Immature Granulocytes: 2 %
Lymphocytes Relative: 11 %
Lymphs Abs: 0.5 10*3/uL — ABNORMAL LOW (ref 0.7–4.0)
MCH: 31 pg (ref 26.0–34.0)
MCHC: 33.2 g/dL (ref 30.0–36.0)
MCV: 93.4 fL (ref 80.0–100.0)
Monocytes Absolute: 0.6 10*3/uL (ref 0.1–1.0)
Monocytes Relative: 13 %
Neutro Abs: 3.1 10*3/uL (ref 1.7–7.7)
Neutrophils Relative %: 70 %
Platelets: 122 10*3/uL — ABNORMAL LOW (ref 150–400)
RBC: 3.35 MIL/uL — ABNORMAL LOW (ref 4.22–5.81)
RDW: 16.6 % — ABNORMAL HIGH (ref 11.5–15.5)
WBC: 4.4 10*3/uL (ref 4.0–10.5)
nRBC: 0 % (ref 0.0–0.2)

## 2020-08-31 LAB — BASIC METABOLIC PANEL
Anion gap: 7 (ref 5–15)
BUN: 26 mg/dL — ABNORMAL HIGH (ref 8–23)
CO2: 24 mmol/L (ref 22–32)
Calcium: 7.9 mg/dL — ABNORMAL LOW (ref 8.9–10.3)
Chloride: 102 mmol/L (ref 98–111)
Creatinine, Ser: 1 mg/dL (ref 0.61–1.24)
GFR, Estimated: >60 mL/min (ref >60)
Glucose, Bld: 104 mg/dL — ABNORMAL HIGH (ref 70–99)
Potassium: 3.8 mmol/L (ref 3.5–5.1)
Sodium: 133 mmol/L — ABNORMAL LOW (ref 135–145)

## 2020-08-31 LAB — PROTIME-INR
INR: 2.1 — ABNORMAL HIGH (ref 0.8–1.2)
Prothrombin Time: 23 seconds — ABNORMAL HIGH (ref 11.4–15.2)

## 2020-08-31 LAB — MAGNESIUM: Magnesium: 2.4 mg/dL (ref 1.7–2.4)

## 2020-08-31 LAB — HEPARIN LEVEL (UNFRACTIONATED): Heparin Unfractionated: 0.37 IU/mL (ref 0.30–0.70)

## 2020-08-31 MED ORDER — WARFARIN SODIUM 5 MG PO TABS
5.0000 mg | ORAL_TABLET | Freq: Once | ORAL | Status: AC
  Administered 2020-08-31: 5 mg via ORAL
  Filled 2020-08-31: qty 1

## 2020-08-31 NOTE — Progress Notes (Signed)
PROGRESS NOTE    Richard Spears   HAL:[id number]  DOB: [birth year]  DOA: 08/25/2020     5  PCP: [name], MD  CC: fall at home  Hospital Course: Richard Spears is a 87 yo male with PMH PAF, mechanical MVR (1997, on Coumadin), BPH, OA, previous R/L hip arthroplasties, hypertension who presented to the ER with severe left thigh pain after a fall at home.  Fall was considered mechanical in nature.    Imaging revealed a significant acute spiral fracture of the distal left femoral diaphysis with displacement.  Traction was initiated and he was admitted for further work-up and surgical evaluation.  Coumadin was placed on hold and he was transitioned to a heparin drip when INR fell below 2.5. He also developed a downtrend in hemoglobin and was transfused 2 units PRBC on 08/26/2020. Hemoglobin improved to 8.4 g/dL after transfusion and he was given 2 more units per orthopedic surgery.  Follow-up hemoglobin the following morning was 10.6 g/dL. He underwent surgery on 08/28/2020.  He received 3 additional units PRBC during surgery for intraoperative blood loss.  A Hemovac was also placed. Coumadin was resumed on 08/28/2020 followed by heparin drip 24 hours postop.   Interval History:  Continuing to do relatively well.  Mentation remains intact with no significant reports of sundowning or delirium.  Pain mostly controlled.  Continuing to work with physical therapy.  Likely will start pursuing SNF placement this coming week.  ROS: Constitutional: negative for chills and fevers, Respiratory: negative for cough, Cardiovascular: negative for chest pain and Gastrointestinal: negative for abdominal pain  Assessment & Plan: * Closed displaced fracture of distal epiphysis of left femur (HCC) - s/p mechanical fall at home - s/p ORIF on 08/28/20 - post-op rec's per ortho: touchdown weightbearing with walker; unlimited ROM of L knee - SNF pending - mentation remains stable; no delirium noted  S/P  MVR (mitral valve replacement) - goal INR 2.5 - 3.5 - coumadin resumed 3/3 post-op; on heparin for bridging  - continue heparin and coumadin per pharmacy   Macrocytic anemia - iron 60, sat ratio 15%, TIBC ULN (413) - folate 44, B12 478 - continue MVI - Hgb down to 7.6 g/dL on 3/1; no s/s bleeding or compartment syndrome; ortho transfusing 2 units PRBC - repeat Hgb 3/3, 8.4 g/dL on 3/2, transfused 2 more units per ortho - ~1500 cc EBL in surgery, s/p 3 units PRBC - Hgb this am (3/5) is 10.4 g/dL; vitals are stable; hemovac in place - CBC daily   Alcohol use - no s/s withdrawal - d/c CIWA  Essential hypertension -Continue bisoprolol, Lasix, hydralazine, Imdur  Longstanding persistent atrial fibrillation: CHA2DS2-VASc Score 3 - on Coumadin chronically -Continue bisoprolol  Constipation-resolved as of 08/30/2020 -Difficulty with constipation during hospitalization and no response to MiraLAX or oral stool softeners -Treated with magnesium citrate on 08/29/2020 followed by large bowel movements -Laxatives have now been discontinued -Monitor bowel movements while still on opiates  AKI (acute kidney injury) (HCC)-resolved as of 08/27/2020 - baseline creatinine ~ 1.1 - patient presents with increase in creat >0.3 mg/dL above baseline presumed to have occurred within past 7 days PTA - s/p IVF  Old records reviewed in assessment of this patient  Antimicrobials: n/a  DVT prophylaxis: SCDs Start: 08/28/20 2026  Coumadin resumed 08/28/2020.  Heparin resumed 24-hour postop   Code Status:   Code Status: Full Code Family Communication: son bedside   Disposition Plan: Status is: Observation  The patient will require  care spanning > 2 midnights and should be moved to inpatient because: Ongoing active pain requiring inpatient pain management, IV treatments appropriate due to intensity of illness or inability to take PO and Inpatient level of care appropriate due to severity of  illness  Dispo: The patient is from: Home              Anticipated d/c is to: SNF              Patient currently is not medically stable to d/c.   Difficult to place patient No   Risk of unplanned readmission score: Unplanned Admission- Pilot do not use: 19.23   Objective: Blood pressure 123/62, pulse 68, temperature 98.6 F (37 C), resp. rate 15, height 6' (1.829 m), weight 70.3 kg, SpO2 97 %.  Examination: General appearance: alert, cooperative and no distress Head: Normocephalic, without obvious abnormality, atraumatic Eyes: EOMI Lungs: clear to auscultation bilaterally Heart: irregularly irregular rhythm and S1, S2 normal Abdomen: normal findings: bowel sounds normal and soft, non-tender Extremities: Left lower extremity wrapped in bandage with Hemovac in place, sanguinous blood noted in bag; compartments soft Skin: Skin flaking and severely dried skin with dark pigmentation noted in right lower extremity Neurologic: LLE deferred but sensation intact in LLE and no other obvious focal deficits   Consultants:   Ortho surgery  Procedures:   ORIF Left femus, 08/28/20  Data Reviewed: I have personally reviewed following labs and imaging studies Results for orders placed or performed during the hospital encounter of 08/25/20 (from the past 24 hour(s))  Basic metabolic panel     Status: Abnormal   Collection Time: 08/31/20  1:11 AM  Result Value Ref Range   Sodium 133 (L) 135 - 145 mmol/L   Potassium 3.8 3.5 - 5.1 mmol/L   Chloride 102 98 - 111 mmol/L   CO2 24 22 - 32 mmol/L   Glucose, Bld 104 (H) 70 - 99 mg/dL   BUN 26 (H) 8 - 23 mg/dL   Creatinine, Ser 1.00 0.61 - 1.24 mg/dL   Calcium 7.9 (L) 8.9 - 10.3 mg/dL   GFR, Estimated >60 >60 mL/min   Anion gap 7 5 - 15  CBC with Differential/Platelet     Status: Abnormal   Collection Time: 08/31/20  1:11 AM  Result Value Ref Range   WBC 4.4 4.0 - 10.5 K/uL   RBC 3.35 (L) 4.22 - 5.81 MIL/uL   Hemoglobin 10.4 (L) 13.0 - 17.0  g/dL   HCT 31.3 (L) 39.0 - 52.0 %   MCV 93.4 80.0 - 100.0 fL   MCH 31.0 26.0 - 34.0 pg   MCHC 33.2 30.0 - 36.0 g/dL   RDW 16.6 (H) 11.5 - 15.5 %   Platelets 122 (L) 150 - 400 K/uL   nRBC 0.0 0.0 - 0.2 %   Neutrophils Relative % 70 %   Neutro Abs 3.1 1.7 - 7.7 K/uL   Lymphocytes Relative 11 %   Lymphs Abs 0.5 (L) 0.7 - 4.0 K/uL   Monocytes Relative 13 %   Monocytes Absolute 0.6 0.1 - 1.0 K/uL   Eosinophils Relative 3 %   Eosinophils Absolute 0.2 0.0 - 0.5 K/uL   Basophils Relative 1 %   Basophils Absolute 0.0 0.0 - 0.1 K/uL   Immature Granulocytes 2 %   Abs Immature Granulocytes 0.08 (H) 0.00 - 0.07 K/uL  Protime-INR     Status: Abnormal   Collection Time: 08/31/20  1:11 AM  Result Value Ref Range  Prothrombin Time 23.0 (H) 11.4 - 15.2 seconds   INR 2.1 (H) 0.8 - 1.2  Heparin level (unfractionated)     Status: None   Collection Time: 08/31/20  1:11 AM  Result Value Ref Range   Heparin Unfractionated 0.37 0.30 - 0.70 IU/mL  Magnesium     Status: None   Collection Time: 08/31/20  1:11 AM  Result Value Ref Range   Magnesium 2.4 1.7 - 2.4 mg/dL    Recent Results (from the past 240 hour(s))  Resp Panel by RT-PCR (Flu A&B, Covid) Nasopharyngeal Swab     Status: None   Collection Time: 08/25/20  6:32 PM   Specimen: Nasopharyngeal Swab; Nasopharyngeal(NP) swabs in vial transport medium  Result Value Ref Range Status   SARS Coronavirus 2 by RT PCR NEGATIVE NEGATIVE Final    Comment: (NOTE) SARS-CoV-2 target nucleic acids are NOT DETECTED.  The SARS-CoV-2 RNA is generally detectable in upper respiratory specimens during the acute phase of infection. The lowest concentration of SARS-CoV-2 viral copies this assay can detect is 138 copies/mL. A negative result does not preclude SARS-Cov-2 infection and should not be used as the sole basis for treatment or other patient management decisions. A negative result may occur with  improper specimen collection/handling, submission of  specimen other than nasopharyngeal swab, presence of viral mutation(s) within the areas targeted by this assay, and inadequate number of viral copies(<138 copies/mL). A negative result must be combined with clinical observations, patient history, and epidemiological information. The expected result is Negative.  Fact Sheet for Patients:  EntrepreneurPulse.com.au  Fact Sheet for Healthcare Providers:  IncredibleEmployment.be  This test is no t yet approved or cleared by the [place] FDA and  has been authorized for detection and/or diagnosis of SARS-CoV-2 by FDA under an Emergency Use Authorization (EUA). This EUA will remain  in effect (meaning this test can be used) for the duration of the COVID-19 declaration under Section 564(b)(1) of the Act, 21 U.S.C.section 360bbb-3(b)(1), unless the authorization is terminated  or revoked sooner.       Influenza A by PCR NEGATIVE NEGATIVE Final   Influenza B by PCR NEGATIVE NEGATIVE Final    Comment: (NOTE) The Xpert Xpress SARS-CoV-2/FLU/RSV plus assay is intended as an aid in the diagnosis of influenza from Nasopharyngeal swab specimens and should not be used as a sole basis for treatment. Nasal washings and aspirates are unacceptable for Xpert Xpress SARS-CoV-2/FLU/RSV testing.  Fact Sheet for Patients: EntrepreneurPulse.com.au  Fact Sheet for Healthcare Providers: IncredibleEmployment.be  This test is not yet approved or cleared by the [place] FDA and has been authorized for detection and/or diagnosis of SARS-CoV-2 by FDA under an Emergency Use Authorization (EUA). This EUA will remain in effect (meaning this test can be used) for the duration of the COVID-19 declaration under Section 564(b)(1) of the Act, 21 U.S.C. section 360bbb-3(b)(1), unless the authorization is terminated or revoked.  Performed at [facility], [place] [address],  [place], [place] [address]   Surgical pcr screen     Status: None   Collection Time: 08/26/20  5:49 PM   Specimen: Nasal Mucosa; Nasal Swab  Result Value Ref Range Status   MRSA, PCR NEGATIVE NEGATIVE Final   Staphylococcus aureus NEGATIVE NEGATIVE Final    Comment: (NOTE) The Xpert SA Assay (FDA approved for NASAL specimens in patients 78 years of age and older), is one component of a comprehensive surveillance program. It is not intended to diagnose infection nor to guide or  monitor treatment. Performed at [facility], [place] [address] [place],  [address]      Radiology Studies: No results found. DG FEMUR PORT MIN 2 VIEWS LEFT  Final Result    DG C-Arm 1-60 Min  Final Result    DG FEMUR MIN 2 VIEWS LEFT  Final Result    DG Pelvis 1-2 Views  Final Result    DG FEMUR MIN 2 VIEWS LEFT  Final Result    DG Chest Portable 1 View  Final Result      Scheduled Meds: . bisoprolol  5 mg Oral Daily  . docusate sodium  100 mg Oral BID  . folic acid  1 mg Oral Daily  . furosemide  40 mg Oral Daily  . hydrALAZINE  25 mg Oral Q lunch  . isosorbide mononitrate  30 mg Oral Daily  . multivitamin with minerals  1 tablet Oral Daily  . thiamine  100 mg Oral Daily   Or  . thiamine  100 mg Intravenous Daily  . warfarin  5 mg Oral ONCE-1600  . Warfarin - Pharmacist Dosing Inpatient   Does not apply q1600   PRN Meds: acetaminophen, fluticasone, HYDROcodone-acetaminophen, menthol-cetylpyridinium **OR** phenol, methocarbamol **OR** [DISCONTINUED] methocarbamol (ROBAXIN) IV, metoCLOPramide **OR** metoCLOPramide (REGLAN) injection, ondansetron **OR** ondansetron (ZOFRAN) IV Continuous Infusions: . heparin 1,500 Units/hr (08/31/20 0541)     LOS: 5 days  Time spent: Greater than 50% of the 35 minute visit was spent in counseling/coordination of care for the patient as laid out in the A&P.   Dwyane Dee, MD [facility] 08/31/2020, 3:39 PM

## 2020-08-31 NOTE — Plan of Care (Signed)

## 2020-08-31 NOTE — Progress Notes (Signed)
ANTICOAGULATION CONSULT NOTE - Follow Up Consult  Pharmacy Consult for Heparin, warfarin  Indication: Atrial Fibrillation, St Jude mech MVR  Allergies  Allergen Reactions  . Flexeril [Cyclobenzaprine] Diarrhea   Heparin Dosing Weight: 70.3 kg  Vital Signs: Temp: 98 F (36.7 C) (03/06 0300) Temp Source: Oral (03/06 0300) BP: 123/62 (03/06 0300) Pulse Rate: 68 (03/06 0300)  Labs: Recent Labs    08/29/20 0804 08/30/20 0214 08/30/20 1313 08/31/20 0111  HGB 11.0* 10.7*  --  10.4*  HCT 32.4* 30.9*  --  31.3*  PLT 106* 121*  --  122*  LABPROT 16.5* 18.4*  --  23.0*  INR 1.4* 1.6*  --  2.1*  HEPARINUNFRC  --  0.17* 0.34 0.37  CREATININE 1.15 1.11  --  1.00    Estimated Creatinine Clearance: 45.9 mL/min (by C-G formula based on SCr of 1 mg/dL).  Assessment: 28 yom on warfarin PTA for atrial fibrillation and St Jude MVR (INR goal 2.5-3.5) admitted after fall at home. Warfarin on hold in anticipation of surgery for hip fracture, received 5mg  PO vitamin K on 3/1. Patient has hematoma on LUE from fall. He is now s/p ORIF L femur 3/3 PM. Pharmacy consulted to restart warfarin and bridge with heparin infusion until INR therapeutic.  INR subtherapeutic at 2.1 today after resuming home dose. Heparin level is therapeutic (0.37). Hg low stable at 10.4, Plt low stable at 122. No active bleed issues reported.  PTA warfarin dose is 5 mg daily.  Goal of Therapy:  Heparin level 0.3-0.7 units/ml  INR 2.5-3.5 Monitor platelets by anticoagulation protocol: Yes   Plan:  Warfarin 5 mg PO x 1 tonight Continue heparin infusion at 1500 units/hr Monitor daily heparin level, INR/CBC, s/sx bleeding   Berenice Bouton, PharmD, BCPS PGY2 Pharmacy Resident Phone between 7 am - 3:30 pm: [phone]  Please check AMION for all [place] phone numbers After 10:00 PM, call [place] [phone]  08/31/2020 7:22 AM

## 2020-08-31 NOTE — Progress Notes (Signed)
Subjective: 3 Days Post-Op Procedure(s) (LRB): OPEN REDUCTION INTERNAL FIXATION (ORIF) DISTAL FEMUR FRACTURE (Left) Patient seen in rounds for Dr. Lyla Glassing Patient reports pain as 2 on 0-10 scale.   Patient is improving Denies CP, SOB + void/ flatus   Objective: Vital signs in last 24 hours: Temp:  [97.5 F (36.4 C)-98.1 F (36.7 C)] 98 F (36.7 C) (03/06 0300) Pulse Rate:  [59-91] 68 (03/06 0300) Resp:  [15-18] 15 (03/06 0300) BP: (95-144)/(48-92) 123/62 (03/06 0300) SpO2:  [95 %-97 %] 97 % (03/06 0300)  Intake/Output from previous day: 03/05 0701 - 03/06 0700 In: 720 [P.O.:720] Out: 1535 [Urine:1500; Drains:35] Intake/Output this shift: No intake/output data recorded.  Recent Labs    08/28/20 1724 08/29/20 0804 08/30/20 0214 08/31/20 0111  HGB 11.9* 11.0* 10.7* 10.4*   Recent Labs    08/30/20 0214 08/31/20 0111  WBC 5.5 4.4  RBC 3.40* 3.35*  HCT 30.9* 31.3*  PLT 121* 122*   Recent Labs    08/30/20 0214 08/31/20 0111  NA 132* 133*  K 3.9 3.8  CL 104 102  CO2 22 24  BUN 35* 26*  CREATININE 1.11 1.00  GLUCOSE 123* 104*  CALCIUM 8.1* 7.9*   Recent Labs    08/30/20 0214 08/31/20 0111  INR 1.6* 2.1*    Neurologically intact Neurovascular intact Sensation intact distally Intact pulses distally Dorsiflexion/Plantar flexion intact Incision: dressing C/D/I Compartment soft   Assessment/Plan: 3 Days Post-Op Procedure(s) (LRB): OPEN REDUCTION INTERNAL FIXATION (ORIF) DISTAL FEMUR FRACTURE (Left) Up with therapy  TDWB with therapy Pain well controlled at this time   Derrick Ravel [phone] 08/31/2020, 7:41 AM

## 2020-09-01 LAB — CBC
HCT: 29.1 % — ABNORMAL LOW (ref 39.0–52.0)
Hemoglobin: 10 g/dL — ABNORMAL LOW (ref 13.0–17.0)
MCH: 31.7 pg (ref 26.0–34.0)
MCHC: 34.4 g/dL (ref 30.0–36.0)
MCV: 92.4 fL (ref 80.0–100.0)
Platelets: 144 10*3/uL — ABNORMAL LOW (ref 150–400)
RBC: 3.15 MIL/uL — ABNORMAL LOW (ref 4.22–5.81)
RDW: 16.4 % — ABNORMAL HIGH (ref 11.5–15.5)
WBC: 4 10*3/uL (ref 4.0–10.5)
nRBC: 0 % (ref 0.0–0.2)

## 2020-09-01 LAB — BASIC METABOLIC PANEL
Anion gap: 7 (ref 5–15)
BUN: 27 mg/dL — ABNORMAL HIGH (ref 8–23)
CO2: 23 mmol/L (ref 22–32)
Calcium: 7.9 mg/dL — ABNORMAL LOW (ref 8.9–10.3)
Chloride: 103 mmol/L (ref 98–111)
Creatinine, Ser: 0.99 mg/dL (ref 0.61–1.24)
GFR, Estimated: >60 mL/min (ref >60)
Glucose, Bld: 88 mg/dL (ref 70–99)
Potassium: 3.7 mmol/L (ref 3.5–5.1)
Sodium: 133 mmol/L — ABNORMAL LOW (ref 135–145)

## 2020-09-01 LAB — HEPARIN LEVEL (UNFRACTIONATED): Heparin Unfractionated: 0.48 IU/mL (ref 0.30–0.70)

## 2020-09-01 LAB — SARS CORONAVIRUS 2 (TAT 6-24 HRS): SARS Coronavirus 2: NEGATIVE

## 2020-09-01 LAB — PROTIME-INR
INR: 2.3 — ABNORMAL HIGH (ref 0.8–1.2)
Prothrombin Time: 24.6 seconds — ABNORMAL HIGH (ref 11.4–15.2)

## 2020-09-01 LAB — MAGNESIUM: Magnesium: 2.2 mg/dL (ref 1.7–2.4)

## 2020-09-01 MED ORDER — WARFARIN SODIUM 5 MG PO TABS
5.0000 mg | ORAL_TABLET | Freq: Once | ORAL | Status: AC
  Administered 2020-09-01: 5 mg via ORAL
  Filled 2020-09-01: qty 1

## 2020-09-01 NOTE — TOC Progression Note (Signed)
Transition of Care [facility]) - Progression Note    Patient Details  Name: Richard Spears MRN: [id number] Date of Birth: 1925-08-17  [facility]) CM/SW [place], [place] Phone Number: 09/01/2020, 12:08 PM  Clinical Narrative:    CSW spoke with pt and son at bedside who confirmed they would like for pt to receive rehab at the [facility]. CSW confirmed with facility and they stated they could take the pt tomorrow with an updated covid test, FL2, and discharge documents. Transport documents are on the chart, SW will continue to follow for DC planning.   Expected Discharge Plan: [place] Barriers to Discharge: Continued Medical Work up  Expected Discharge Plan and Services Expected Discharge Plan: [place] In-house Referral: Clinical Social Work     Living arrangements for the past 2 months: [place]                                       Social Determinants of Health (SDOH) Interventions    Readmission Risk Interventions No flowsheet data found.

## 2020-09-01 NOTE — Progress Notes (Signed)
PROGRESS NOTE    KEYWON MESTRE   XVQ:[id number]  DOB: July 27, 1925  DOA: 08/25/2020     6  PCP: [name], MD  CC: fall at home  Hospital Course: Mr. Manzo is a 87 yo male with PMH PAF, mechanical MVR (1997, on Coumadin), BPH, OA, previous R/L hip arthroplasties, hypertension who presented to the ER with severe left thigh pain after a fall at home.  Fall was considered mechanical in nature.    Imaging revealed a significant acute spiral fracture of the distal left femoral diaphysis with displacement.  Traction was initiated and he was admitted for further work-up and surgical evaluation.  Coumadin was placed on hold and he was transitioned to a heparin drip when INR fell below 2.5. He also developed a downtrend in hemoglobin and was transfused 2 units PRBC on 08/26/2020. Hemoglobin improved to 8.4 g/dL after transfusion and he was given 2 more units per orthopedic surgery.  Follow-up hemoglobin the following morning was 10.6 g/dL. He underwent surgery on 08/28/2020.  He received 3 additional units PRBC during surgery for intraoperative blood loss.  A Hemovac was also placed. Coumadin was resumed on 08/28/2020 followed by heparin drip 24 hours postop.   Interval History:  Continuing to do relatively well. No events overnight. Discharge planning to [facility] ([place]).  ROS: Constitutional: negative for chills and fevers, Respiratory: negative for cough, Cardiovascular: negative for chest pain and Gastrointestinal: negative for abdominal pain  Assessment & Plan: * Closed displaced fracture of distal epiphysis of left femur (HCC) - s/p mechanical fall at home - s/p ORIF on 08/28/20 - post-op rec's per ortho: touchdown weightbearing with walker; unlimited ROM of L knee - SNF pending - mentation remains stable; no delirium noted  S/P MVR (mitral valve replacement) - goal INR 2.5 - 3.5 - coumadin resumed 3/3 post-op; on heparin for bridging  - continue heparin and coumadin per  pharmacy  - INR up to 2.3 today; if INR within goal range on 3/8, would be stable to d/c to SNF  Macrocytic anemia - iron 60, sat ratio 15%, TIBC ULN (413) - folate 44, B12 478 - continue MVI - Hgb down to 7.6 g/dL on 3/1; no s/s bleeding or compartment syndrome; ortho transfusing 2 units PRBC - repeat Hgb 3/3, 8.4 g/dL on 3/2, transfused 2 more units per ortho - ~1500 cc EBL in surgery, s/p 3 units PRBC - Hgb this am (3/7) is 10 g/dL; vitals are stable; hemovac in place - CBC daily  -Hemoglobin has been very slowly downtrending.  Hemovac now removed and suspect should be stabilizing.  Will need repeat CBC in 2 to 3 days  Alcohol use - no s/s withdrawal - d/c CIWA  Essential hypertension -Continue bisoprolol, Lasix, hydralazine, Imdur  Longstanding persistent atrial fibrillation: CHA2DS2-VASc Score 3 - on Coumadin chronically -Continue bisoprolol  Constipation-resolved as of 08/30/2020 -Difficulty with constipation during hospitalization and no response to MiraLAX or oral stool softeners -Treated with magnesium citrate on 08/29/2020 followed by large bowel movements -Laxatives have now been discontinued -Monitor bowel movements while still on opiates  AKI (acute kidney injury) (HCC)-resolved as of 08/27/2020 - baseline creatinine ~ 1.1 - patient presents with increase in creat >0.3 mg/dL above baseline presumed to have occurred within past 7 days PTA - s/p IVF  Old records reviewed in assessment of this patient  Antimicrobials: n/a  DVT prophylaxis: SCDs Start: 08/28/20 2026  Coumadin resumed 08/28/2020.  Heparin resumed 24-hour postop   Code Status:  Code Status: Full Code Family Communication: son bedside   Disposition Plan: Status is: Observation  The patient will require care spanning > 2 midnights and should be moved to inpatient because: Ongoing active pain requiring inpatient pain management, IV treatments appropriate due to intensity of illness or inability to take  PO and Inpatient level of care appropriate due to severity of illness  Dispo: The patient is from: Home              Anticipated d/c is to: SNF              Patient currently is not medically stable to d/c.   Difficult to place patient No   Risk of unplanned readmission score: Unplanned Admission- Pilot do not use: 19.51   Objective: Blood pressure 132/86, pulse 71, temperature 97.8 F (36.6 C), temperature source Oral, resp. rate 15, height 6' (1.829 m), weight 70.3 kg, SpO2 97 %.  Examination: General appearance: alert, cooperative and no distress Head: Normocephalic, without obvious abnormality, atraumatic Eyes: EOMI Lungs: clear to auscultation bilaterally Heart: irregularly irregular rhythm and S1, S2 normal Abdomen: normal findings: bowel sounds normal and soft, non-tender Extremities: Surgical dressing in place.  Hemovac now removed.  Thigh compartment soft and appropriately tender to palpation Skin: Skin flaking and severely dried skin with dark pigmentation noted in right lower extremity Neurologic: Normal sensation throughout.  Strength in left lower extremity limited by pain  Consultants:   Ortho surgery  Procedures:   ORIF Left femur, 08/28/20  Data Reviewed: I have personally reviewed following labs and imaging studies Results for orders placed or performed during the hospital encounter of 08/25/20 (from the past 24 hour(s))  Protime-INR     Status: Abnormal   Collection Time: 09/01/20  2:47 AM  Result Value Ref Range   Prothrombin Time 24.6 (H) 11.4 - 15.2 seconds   INR 2.3 (H) 0.8 - 1.2  Heparin level (unfractionated)     Status: None   Collection Time: 09/01/20  2:47 AM  Result Value Ref Range   Heparin Unfractionated 0.48 0.30 - 0.70 IU/mL  Magnesium     Status: None   Collection Time: 09/01/20  2:47 AM  Result Value Ref Range   Magnesium 2.2 1.7 - 2.4 mg/dL  CBC     Status: Abnormal   Collection Time: 09/01/20  2:47 AM  Result Value Ref Range   WBC  4.0 4.0 - 10.5 K/uL   RBC 3.15 (L) 4.22 - 5.81 MIL/uL   Hemoglobin 10.0 (L) 13.0 - 17.0 g/dL   HCT 29.1 (L) 39.0 - 52.0 %   MCV 92.4 80.0 - 100.0 fL   MCH 31.7 26.0 - 34.0 pg   MCHC 34.4 30.0 - 36.0 g/dL   RDW 16.4 (H) 11.5 - 15.5 %   Platelets 144 (L) 150 - 400 K/uL   nRBC 0.0 0.0 - 0.2 %  Basic metabolic panel     Status: Abnormal   Collection Time: 09/01/20  2:47 AM  Result Value Ref Range   Sodium 133 (L) 135 - 145 mmol/L   Potassium 3.7 3.5 - 5.1 mmol/L   Chloride 103 98 - 111 mmol/L   CO2 23 22 - 32 mmol/L   Glucose, Bld 88 70 - 99 mg/dL   BUN 27 (H) 8 - 23 mg/dL   Creatinine, Ser 0.99 0.61 - 1.24 mg/dL   Calcium 7.9 (L) 8.9 - 10.3 mg/dL   GFR, Estimated >60 >60 mL/min   Anion gap 7 5 -  15    Recent Results (from the past 240 hour(s))  Resp Panel by RT-PCR (Flu A&B, Covid) Nasopharyngeal Swab     Status: None   Collection Time: 08/25/20  6:32 PM   Specimen: Nasopharyngeal Swab; Nasopharyngeal(NP) swabs in vial transport medium  Result Value Ref Range Status   SARS Coronavirus 2 by RT PCR NEGATIVE NEGATIVE Final    Comment: (NOTE) SARS-CoV-2 target nucleic acids are NOT DETECTED.  The SARS-CoV-2 RNA is generally detectable in upper respiratory specimens during the acute phase of infection. The lowest concentration of SARS-CoV-2 viral copies this assay can detect is 138 copies/mL. A negative result does not preclude SARS-Cov-2 infection and should not be used as the sole basis for treatment or other patient management decisions. A negative result may occur with  improper specimen collection/handling, submission of specimen other than nasopharyngeal swab, presence of viral mutation(s) within the areas targeted by this assay, and inadequate number of viral copies(<138 copies/mL). A negative result must be combined with clinical observations, patient history, and epidemiological information. The expected result is Negative.  Fact Sheet for Patients:   EntrepreneurPulse.com.au  Fact Sheet for Healthcare Providers:  IncredibleEmployment.be  This test is no t yet approved or cleared by the [place] FDA and  has been authorized for detection and/or diagnosis of SARS-CoV-2 by FDA under an Emergency Use Authorization (EUA). This EUA will remain  in effect (meaning this test can be used) for the duration of the COVID-19 declaration under Section 564(b)(1) of the Act, 21 U.S.C.section 360bbb-3(b)(1), unless the authorization is terminated  or revoked sooner.       Influenza A by PCR NEGATIVE NEGATIVE Final   Influenza B by PCR NEGATIVE NEGATIVE Final    Comment: (NOTE) The Xpert Xpress SARS-CoV-2/FLU/RSV plus assay is intended as an aid in the diagnosis of influenza from Nasopharyngeal swab specimens and should not be used as a sole basis for treatment. Nasal washings and aspirates are unacceptable for Xpert Xpress SARS-CoV-2/FLU/RSV testing.  Fact Sheet for Patients: EntrepreneurPulse.com.au  Fact Sheet for Healthcare Providers: IncredibleEmployment.be  This test is not yet approved or cleared by the [place] FDA and has been authorized for detection and/or diagnosis of SARS-CoV-2 by FDA under an Emergency Use Authorization (EUA). This EUA will remain in effect (meaning this test can be used) for the duration of the COVID-19 declaration under Section 564(b)(1) of the Act, 21 U.S.C. section 360bbb-3(b)(1), unless the authorization is terminated or revoked.  Performed at [facility], [place] [address], [place], [place] [address]   Surgical pcr screen     Status: None   Collection Time: 08/26/20  5:49 PM   Specimen: Nasal Mucosa; Nasal Swab  Result Value Ref Range Status   MRSA, PCR NEGATIVE NEGATIVE Final   Staphylococcus aureus NEGATIVE NEGATIVE Final    Comment: (NOTE) The Xpert SA Assay (FDA approved for NASAL specimens in patients  72 years of age and older), is one component of a comprehensive surveillance program. It is not intended to diagnose infection nor to guide or monitor treatment. Performed at [facility], [place] [address] [place], [place] [address]      Radiology Studies: No results found. DG FEMUR PORT MIN 2 VIEWS LEFT  Final Result    DG C-Arm 1-60 Min  Final Result    DG FEMUR MIN 2 VIEWS LEFT  Final Result    DG Pelvis 1-2 Views  Final Result    DG FEMUR MIN 2 VIEWS LEFT  Final Result    DG Chest Portable 1 View  Final Result      Scheduled Meds: . bisoprolol  5 mg Oral Daily  . docusate sodium  100 mg Oral BID  . folic acid  1 mg Oral Daily  . furosemide  40 mg Oral Daily  . hydrALAZINE  25 mg Oral Q lunch  . isosorbide mononitrate  30 mg Oral Daily  . multivitamin with minerals  1 tablet Oral Daily  . thiamine  100 mg Oral Daily   Or  . thiamine  100 mg Intravenous Daily  . warfarin  5 mg Oral ONCE-1600  . Warfarin - Pharmacist Dosing Inpatient   Does not apply q1600   PRN Meds: acetaminophen, fluticasone, HYDROcodone-acetaminophen, menthol-cetylpyridinium **OR** phenol, methocarbamol **OR** [DISCONTINUED] methocarbamol (ROBAXIN) IV, metoCLOPramide **OR** metoCLOPramide (REGLAN) injection, ondansetron **OR** ondansetron (ZOFRAN) IV Continuous Infusions: . heparin 1,500 Units/hr (08/31/20 2348)     LOS: 6 days  Time spent: Greater than 50% of the 35 minute visit was spent in counseling/coordination of care for the patient as laid out in the A&P.   Dwyane Dee, MD [facility] 09/01/2020, 1:39 PM

## 2020-09-01 NOTE — Care Management Important Message (Signed)
Important Message  Patient Details  Name: Richard Spears MRN: [id number] Date of Birth: 10-Jul-1925   Medicare Important Message Given:  Yes     [name] P Saxis 09/01/2020, 2:05 PM

## 2020-09-01 NOTE — Progress Notes (Signed)
Subjective: 4 Days Post-Op Procedure(s) (LRB): OPEN REDUCTION INTERNAL FIXATION (ORIF) DISTAL FEMUR FRACTURE (Left) Patient seen in rounds for Dr. Lyla Glassing Patient reports pain as 2 on 0-10 scale.   Patient is continuing to improve Denies CP, SOB + void/ flatus   Objective: Vital signs in last 24 hours: Temp:  [98.7 F (37.1 C)] 98.7 F (37.1 C) (03/06 2028) Pulse Rate:  [74] 74 (03/06 2028) BP: (109)/(52) 109/52 (03/06 2028) SpO2:  [96 %] 96 % (03/06 2028)  Intake/Output from previous day: 03/06 0701 - 03/07 0700 In: -  Out: 825 [Urine:800; Drains:25] Intake/Output this shift: No intake/output data recorded.  Recent Labs    08/30/20 0214 08/31/20 0111 09/01/20 0247  HGB 10.7* 10.4* 10.0*   Recent Labs    08/31/20 0111 09/01/20 0247  WBC 4.4 4.0  RBC 3.35* 3.15*  HCT 31.3* 29.1*  PLT 122* 144*   Recent Labs    08/31/20 0111 09/01/20 0247  NA 133* 133*  K 3.8 3.7  CL 102 103  CO2 24 23  BUN 26* 27*  CREATININE 1.00 0.99  GLUCOSE 104* 88  CALCIUM 7.9* 7.9*   Recent Labs    08/31/20 0111 09/01/20 0247  INR 2.1* 2.3*    Neurologically intact Neurovascular intact Sensation intact distally Intact pulses distally Dorsiflexion/Plantar flexion intact Incision: dressing C/D/I Compartment soft   Assessment/Plan: 4 Days Post-Op Procedure(s) (LRB): OPEN REDUCTION INTERNAL FIXATION (ORIF) DISTAL FEMUR FRACTURE (Left) Up with therapy  TDWB with therapy Pain well controlled at this time D/C to SNF pending  Ronny Bacon [facility] [phone] 09/01/2020, 9:42 AM

## 2020-09-01 NOTE — Progress Notes (Signed)
ANTICOAGULATION CONSULT NOTE - Follow Up Consult  Pharmacy Consult for Heparin, warfarin  Indication: Atrial Fibrillation, St Jude mech MVR  Allergies  Allergen Reactions  . Flexeril [Cyclobenzaprine] Diarrhea   Heparin Dosing Weight: 70.3 kg  Vital Signs:    Labs: Recent Labs    08/30/20 0214 08/30/20 1313 08/31/20 0111 09/01/20 0247  HGB 10.7*  --  10.4* 10.0*  HCT 30.9*  --  31.3* 29.1*  PLT 121*  --  122* 144*  LABPROT 18.4*  --  23.0* 24.6*  INR 1.6*  --  2.1* 2.3*  HEPARINUNFRC 0.17* 0.34 0.37 0.48  CREATININE 1.11  --  1.00 0.99    Estimated Creatinine Clearance: 46.4 mL/min (by C-G formula based on SCr of 0.99 mg/dL).  Assessment: 35 yom on warfarin PTA for atrial fibrillation and St Jude MVR (INR goal 2.5-3.5) admitted after fall at home. Warfarin on hold in anticipation of surgery for hip fracture, received 5mg  PO vitamin K on 3/1. Patient has hematoma on LUE from fall. He is now s/p ORIF L femur 3/3 PM. Pharmacy consulted to restart warfarin and bridge with heparin infusion until INR therapeutic.  Heparin level this morning is therapeutic (HL 0.48 << 0.37, goal of 0.3-0.7), INR still SUBtherapeutic but trended up (INR 2.3 << 2.1, goal of 2.5-3.5). Hgb slow trend down 10 << 10.4, plts up to 144.    PTA warfarin dose is 5 mg daily.  Goal of Therapy:  Heparin level 0.3-0.7 units/ml  INR 2.5-3.5 Monitor platelets by anticoagulation protocol: Yes   Plan:  - Continue heparin infusion at 1500 units/hr - Warfarin 5 mg PO x 1 tonight - Would expect INR to be up into goal range likely 3/8 - Monitor daily heparin level, INR/CBC, s/sx bleeding  Thank you for allowing pharmacy to be a part of this patient's care.  Alycia Rossetti, PharmD, BCPS Clinical Pharmacist Clinical phone for 09/01/2020: [phone] 09/01/2020 9:42 AM   **Pharmacist phone directory can now be found on [email or web address] ([email or web address]).  Listed under [place].

## 2020-09-01 NOTE — Plan of Care (Signed)

## 2020-09-01 NOTE — Progress Notes (Signed)
Physical Therapy Treatment Patient Details Name: Richard Spears MRN: [id number] DOB: 1925/10/29 Today's Date: 09/01/2020    History of Present Illness The pt is a 87 yo male presenting 2/28 after sustaining a fall outside. The pt was found to have a spiral fx of the distal L femur and is now s/p ORIF of L femur 3/3. PMH includes: afib, BPH, OA, HTN, mechanical mitral valve replacement, and R/L hip arthroplasties.    PT Comments    Pt supine in bed on arrival this session.  Pt very anxious and required assistance to mobilize to edge of bed and OOB for short trial of gt training.  Tolerated session well and maintained weight bearing this session.  Pt continues to benefit from rehab in a post acute setting.      Follow Up Recommendations  SNF;Supervision/Assistance - 24 hour     Equipment Recommendations   (defer to post acute rehab)    Recommendations for Other Services       Precautions / Restrictions Precautions Precautions: Fall Precaution Comments: pt admitted for a fall Restrictions Weight Bearing Restrictions: Yes LLE Weight Bearing: Partial weight bearing LLE Partial Weight Bearing Percentage or Pounds: 30%    Mobility  Bed Mobility Overal bed mobility: Needs Assistance Bed Mobility: Supine to Sit;Sit to Supine     Supine to sit: Mod assist;+2 for physical assistance     General bed mobility comments: assistance to advance B LEs to edge of bed and elevate trunk into sitting.  Once in sitting max assistance to scoot to edge of bed to prepare for transfer.  pt presents with posterior bias and required cues for weight shifting forward to maintain sitting balance.    Transfers Overall transfer level: Needs assistance Equipment used: Rolling walker (2 wheeled) Transfers: Sit to/from Stand Sit to Stand: Mod assist;+2 physical assistance;From elevated surface         General transfer comment: Cues for hand placement to and from seated surface this session.  Pt  required mod+2 to boost into standing.  Cues for UE use and maintaining weight bearing status.  Ambulation/Gait Ambulation/Gait assistance: Mod assist;+2 physical assistance Gait Distance (Feet): 6 Feet Assistive device: Rolling walker (2 wheeled) Gait Pattern/deviations: Step-to pattern;Antalgic;Narrow base of support;Trunk flexed     General Gait Details: Pt performed short bout of gt forward away from bed this session.  Pt then required chair to be brought up behind patient to sit in recliner.  Pt required cues for sequencing and weight bearing status.   Stairs             Wheelchair Mobility    Modified Rankin (Stroke Patients Only)       Balance Overall balance assessment: Needs assistance Sitting-balance support: Bilateral upper extremity supported;Feet supported Sitting balance-Leahy Scale: Fair   Postural control: Posterior lean   Standing balance-Leahy Scale: Poor Standing balance comment: reliant on BUE support                            Cognition Arousal/Alertness: Awake/alert Behavior During Therapy: WFL for tasks assessed/performed Overall Cognitive Status: Impaired/Different from baseline Area of Impairment: Memory;Safety/judgement;Problem solving                     Memory: Decreased recall of precautions   Safety/Judgement: Decreased awareness of safety   Problem Solving: Decreased initiation;Difficulty sequencing;Requires verbal cues General Comments: verbal cues for WB status repeated through session.  Pt maintaining well.  Exercises General Exercises - Lower Extremity Ankle Circles/Pumps: AROM;Both;20 reps;Supine Quad Sets: AROM;Supine;10 reps;Left Short Arc Quad: AROM;AAROM;Both;10 reps;Supine Heel Slides: AROM;AAROM;Left;10 reps;Supine Hip ABduction/ADduction: AAROM;AROM;Left;10 reps;Supine    General Comments        Pertinent Vitals/Pain Pain Assessment: Faces Faces Pain Scale: Hurts whole lot Pain  Location: L hip Pain Descriptors / Indicators: Guarding;Grimacing;Moaning Pain Intervention(s): Monitored during session;Repositioned    Home Living                      Prior Function            PT Goals (current goals can now be found in the care plan section) Acute Rehab PT Goals Patient Stated Goal: return home Potential to Achieve Goals: Good Progress towards PT goals: Progressing toward goals    Frequency    Min 3X/week      PT Plan Current plan remains appropriate    Co-evaluation              AM-PAC PT "6 Clicks" Mobility   Outcome Measure  Help needed turning from your back to your side while in a flat bed without using bedrails?: A Lot Help needed moving from lying on your back to sitting on the side of a flat bed without using bedrails?: A Lot Help needed moving to and from a bed to a chair (including a wheelchair)?: A Lot Help needed standing up from a chair using your arms (e.g., wheelchair or bedside chair)?: A Lot Help needed to walk in hospital room?: A Lot Help needed climbing 3-5 steps with a railing? : Total 6 Click Score: 11    End of Session Equipment Utilized During Treatment: Gait belt Activity Tolerance: Patient limited by pain;Patient tolerated treatment well Patient left: with call bell/phone within reach;with bed alarm set;with family/visitor present;in chair Nurse Communication: Mobility status;Weight bearing status PT Visit Diagnosis: Muscle weakness (generalized) (M62.81);Other abnormalities of gait and mobility (R26.89);Pain Pain - Right/Left: Right Pain - part of body: Knee;Leg     Time: 1151-1208 PT Time Calculation (min) (ACUTE ONLY): 17 min  Charges:  $Gait Training: 8-22 mins                     Erasmo Leventhal , PTA Acute Rehabilitation Services Pager [phone] Office [phone]     [place] Marney Eli Hose 09/01/2020, 2:24 PM

## 2020-09-02 ENCOUNTER — Other Ambulatory Visit: Payer: Self-pay

## 2020-09-02 DIAGNOSIS — K5900 Constipation, unspecified: Secondary | ICD-10-CM

## 2020-09-02 LAB — CBC WITH DIFFERENTIAL/PLATELET
Abs Immature Granulocytes: 0.16 10*3/uL — ABNORMAL HIGH (ref 0.00–0.07)
Basophils Absolute: 0 10*3/uL (ref 0.0–0.1)
Basophils Relative: 1 %
Eosinophils Absolute: 0.2 10*3/uL (ref 0.0–0.5)
Eosinophils Relative: 5 %
HCT: 28.1 % — ABNORMAL LOW (ref 39.0–52.0)
Hemoglobin: 9.7 g/dL — ABNORMAL LOW (ref 13.0–17.0)
Immature Granulocytes: 4 %
Lymphocytes Relative: 12 %
Lymphs Abs: 0.5 10*3/uL — ABNORMAL LOW (ref 0.7–4.0)
MCH: 32 pg (ref 26.0–34.0)
MCHC: 34.5 g/dL (ref 30.0–36.0)
MCV: 92.7 fL (ref 80.0–100.0)
Monocytes Absolute: 0.5 10*3/uL (ref 0.1–1.0)
Monocytes Relative: 11 %
Neutro Abs: 2.7 10*3/uL (ref 1.7–7.7)
Neutrophils Relative %: 67 %
Platelets: 164 10*3/uL (ref 150–400)
RBC: 3.03 MIL/uL — ABNORMAL LOW (ref 4.22–5.81)
RDW: 16.1 % — ABNORMAL HIGH (ref 11.5–15.5)
WBC: 4 10*3/uL (ref 4.0–10.5)
nRBC: 0 % (ref 0.0–0.2)

## 2020-09-02 LAB — BASIC METABOLIC PANEL
Anion gap: 8 (ref 5–15)
BUN: 25 mg/dL — ABNORMAL HIGH (ref 8–23)
CO2: 22 mmol/L (ref 22–32)
Calcium: 8 mg/dL — ABNORMAL LOW (ref 8.9–10.3)
Chloride: 104 mmol/L (ref 98–111)
Creatinine, Ser: 1.05 mg/dL (ref 0.61–1.24)
GFR, Estimated: >60 mL/min (ref >60)
Glucose, Bld: 97 mg/dL (ref 70–99)
Potassium: 4 mmol/L (ref 3.5–5.1)
Sodium: 134 mmol/L — ABNORMAL LOW (ref 135–145)

## 2020-09-02 LAB — MAGNESIUM: Magnesium: 2.1 mg/dL (ref 1.7–2.4)

## 2020-09-02 LAB — PROTIME-INR
INR: 2.5 — ABNORMAL HIGH (ref 0.8–1.2)
Prothrombin Time: 26.1 s — ABNORMAL HIGH (ref 11.4–15.2)

## 2020-09-02 LAB — HEPARIN LEVEL (UNFRACTIONATED): Heparin Unfractionated: 0.24 IU/mL — ABNORMAL LOW (ref 0.30–0.70)

## 2020-09-02 MED ORDER — WARFARIN SODIUM 5 MG PO TABS: 5.0000 mg | ORAL_TABLET | Freq: Every day | ORAL | Status: DC

## 2020-09-02 MED ORDER — FOLIC ACID 1 MG PO TABS: 1.0000 mg | ORAL_TABLET | Freq: Every day | ORAL | Status: DC

## 2020-09-02 MED ORDER — DOCUSATE SODIUM 100 MG PO CAPS: 100.0000 mg | ORAL_CAPSULE | Freq: Two times a day (BID) | ORAL | 0 refills | Status: DC

## 2020-09-02 MED ORDER — THIAMINE HCL 100 MG PO TABS: 100.0000 mg | ORAL_TABLET | Freq: Every day | ORAL | Status: DC

## 2020-09-02 MED ORDER — LORAZEPAM 1 MG PO TABS: 1.0000 mg | ORAL_TABLET | Freq: Three times a day (TID) | ORAL | 0 refills | Status: DC | PRN

## 2020-09-02 MED ORDER — METHOCARBAMOL 500 MG PO TABS: 500.0000 mg | ORAL_TABLET | Freq: Four times a day (QID) | ORAL | Status: DC | PRN

## 2020-09-02 NOTE — TOC Transition Note (Signed)
Transition of Care [facility]) - CM/SW Discharge Note   Patient Details  Name: Richard Spears MRN: [id number] Date of Birth: 01-18-1926  [facility]) CM/SW Contact:  Gabrielle Dare Phone Number: 09/02/2020, 12:09 PM   Clinical Narrative:    Patient will Discharge To: [place] Anticipated DC Date:09/02/20 Family Notified:yes son, Kelten Enochs, [phone] Transport EH:UDJS   Per MD patient ready for DC to [facility] . RN, patient, patient's family, and facility notified of DC. Assessment, Fl2/Pasrr, and Discharge Summary sent to facility. RN given number for report [phone]). DC packet on chart. Ambulance transport requested for patient.   CSW signing off.  Reed Breech [facility] [phone]     Final next level of care: Skilled Nursing Facility Barriers to Discharge: No Barriers Identified   Patient Goals and CMS Choice Patient states their goals for this hospitalization and ongoing recovery are:: Get stronger CMS Medicare.gov Compare Post Acute Care list provided to:: Patient Represenative (must comment) Choice offered to / list presented to : Adult Children (Son, Alferd Apa)  Discharge Placement              Patient chooses bed at: [facility] Patient to be transferred to facility by: [facility] Name of family member notified: Nathan Moctezuma, son Patient and family notified of of transfer: 09/02/20  Discharge Plan and Services In-house Referral: Clinical Social Work                                   Social Determinants of Health (SDOH) Interventions     Readmission Risk Interventions No flowsheet data found.

## 2020-09-02 NOTE — Discharge Summary (Signed)
Physician Discharge Summary  Richard Spears MVH:[id number] DOB: [birth year] DOA: 08/25/2020  PCP: [name], MD  Admit date: 08/25/2020 Discharge date: 09/02/2020  Admitted From: Home  Discharge disposition: Skilled nursing facility  Recommendations for Outpatient Follow-Up:   . Follow up with your primary care provider at the skilled nursing facility in 3 to 5 days . As per orthopedic recommendation: touchdown weightbearing with walker; unlimited ROM of L knee . Follow-up with orthopedics in 1 week for wound check, x-rays. . Continue to take your Coumadin.  Continue physical therapy.  INR goal 2.5-3.5.  Check INR, CBC in 3 to 5 days.  Discharge Diagnosis:   Principal Problem:   Closed displaced fracture of distal epiphysis of left femur (HCC) Active Problems:   S/P MVR (mitral valve replacement)   Longstanding persistent atrial fibrillation: CHA2DS2-VASc Score 3   Essential hypertension   Macrocytic anemia   Fall at home, initial encounter   Alcohol use   Discharge Condition: Improved.  Diet recommendation: Low sodium, heart healthy.    Wound care: None.  Code status: Full.   History of Present Illness:   Mr. Keeling is a 88 yo male with past medical history of paroxysmal atrial fibrillation, mechanical mitral valve replacement in 1997 on Coumadin, benign prostatic hypertrophy, osteoarthritis, previous right and left hip arthroplasties, hypertension presented to hospital with severe left thigh pain after sustaining a fall at home which was thought to be mechanical in nature.  Imaging revealed a significant acute spiral fracture of the distal left femoral diaphysis with displacement.Traction was initiated and he was admitted for further work-up and surgical evaluation. Coumadin was placed on hold and he was transitioned to a heparin drip when INR fell below 2.5. He also developed a downtrend in hemoglobin and was transfused 2 units PRBC on 08/26/2020. Hemoglobin  improved to 8.4 g/dL after transfusion and he was given 2 more units per orthopedic surgery.  Follow-up hemoglobin the following morning was 10.6 g/dL. He underwent surgery on 08/28/2020.  He received 3 additional units PRBC during surgery for intraoperative blood loss.  A Hemovac was also placed. Coumadin was resumed on 08/28/2020 followed by heparin drip 24 hours postop.   Hospital Course:   Following conditions were addressed during hospitalization as listed below,  Closed displaced fracture of distal epiphysis of left femur Secondary to mechanical fall at home.  Status post open reduction and internal fixation on 08/28/2020.   per ortho: touchdown weightbearing with walker; unlimited ROM of L knee.  Patient was seen by physical therapy who recommended a skilled nursing facility placement on discharge.  S/P mitral valve replacement Patient has been started on Coumadin.  Was on heparin for bridging as well.  INR of 2.5 today.  Goal INR of 2.5-3.5.  Recommend checking PT/INR in 2 to 3 days and adjusting Coumadin doses accordingly.  Macrocytic anemia iron 60, sat ratio 15%, TIBC ULN (413).  folate 44, B12 478.  Follow-up with CBC as outpatient.  Alcohol use No signs of withdrawal noted.  Essential hypertension -Continue bisoprolol, Lasix, hydralazine, Imdur  Longstanding persistent atrial fibrillation:  CHA2DS2-VASc Score 3. Continue bisoprolol and Coumadin.  Constipation-we will continue Colace on discharge while on narcotics.  Acute kidney injury.  Resolved.  Mild.  Likely secondary to volume depletion.  Received IV fluids initially.  Latest creatinine of 1.05  Disposition.  At this time, patient is stable for disposition to skilled nursing facility.  Spoke with the patient's son at bedside.  Medical Consultants:  Orthopedics  Procedures:    ORIF of left femur on 08/28/20 by orthopedics Subjective:   Today, patient was seen and examined at bedside.  Denies interval  complaints.  Complains of mild foreign body sensation in the eyes.  No obvious pain, fever, nausea, vomiting, chills or rigor.  Discharge Exam:   Vitals:   09/02/20 0331 09/02/20 0821  BP: 124/74 135/82  Pulse: 88 89  Resp: 16 17  Temp: 98.5 F (36.9 C) 98 F (36.7 C)  SpO2: 97% 97%   Vitals:   09/01/20 1555 09/01/20 2028 09/02/20 0331 09/02/20 0821  BP: 126/74 127/62 124/74 135/82  Pulse: 71 84 88 89  Resp: 17 16 16 17   Temp: 97.9 F (36.6 C) 98.9 F (37.2 C) 98.5 F (36.9 C) 98 F (36.7 C)  TempSrc: Oral Oral Oral Oral  SpO2: 100% 98% 97% 97%  Weight:      Height:       General: Alert awake, not in obvious distress, thinly built HENT: pupils equally reacting to light,  No scleral pallor or icterus noted. Oral mucosa is moist.  Chest:  Clear breath sounds.  Diminished breath sounds bilaterally. No crackles or wheezes.  CVS: S1 &S2 heard. No murmur.  Regular rate and rhythm. Abdomen: Soft, nontender, nondistended.  Bowel sounds are heard.   Extremities: No cyanosis, clubbing or edema.  Peripheral pulses are palpable.  Surgical dressing in place in the left lower extremity.  Dry skin with pigmentation in the right lower extremity. Psych: Alert, awake and oriented, normal mood CNS:  No cranial nerve deficits.  Power equal in all extremities.   Skin: Warm and dry.  Dry skin with pigmentation in the lower extremities  The results of significant diagnostics from this hospitalization (including imaging, microbiology, ancillary and laboratory) are listed below for reference.     Diagnostic Studies:   DG Pelvis 1-2 Views  Result Date: 08/25/2020 CLINICAL DATA:  [place], left femoral deformity EXAM: PELVIS - 1-2 VIEW COMPARISON:  10/12/2017 FINDINGS: Single frontal view of the pelvis excludes portions of the proximal right femur and right iliac crest by collimation. Bilateral hip arthroplasties are identified. There are no acute displaced pelvic fractures. Soft tissues are  unremarkable. Bowel gas pattern is normal. IMPRESSION: 1. Bilateral hip arthroplasties as above.  No acute pelvic fracture. Electronically Signed   By: Randa Ngo M.D.   On: 08/25/2020 17:30   DG Chest Portable 1 View  Result Date: 08/25/2020 CLINICAL DATA:  Left femur fracture, fell EXAM: PORTABLE CHEST 1 VIEW COMPARISON:  10/09/2017 FINDINGS: Single frontal view of the chest demonstrates marked enlargement the cardiac silhouette, stable. Postsurgical changes from median sternotomy and valvular replacement. Mild chronic central vascular congestion without airspace disease, effusion, or pneumothorax. There are no acute displaced fractures. IMPRESSION: 1. Stable enlarged cardiac silhouette.  No acute process. Electronically Signed   By: Randa Ngo M.D.   On: 08/25/2020 17:32   DG FEMUR MIN 2 VIEWS LEFT  Result Date: 08/25/2020 CLINICAL DATA:  [place], left femur deformity EXAM: LEFT FEMUR 2 VIEWS COMPARISON:  None. FINDINGS: Frontal and cross-table lateral views of the left femur are obtained. Left hip arthroplasty is identified in the expected position. There is an oblique spiral fracture of the distal left femoral diaphysis, with medial displacement and varus angulation at the fracture site. Proximal migration of the distal fracture fragment by approximately 5 cm results in foreshortening of the left leg. There is diffuse soft tissue swelling. IMPRESSION: 1. Acute spiral fracture of  the distal left femoral diaphysis, with valgus angulation as well as medial and proximal displacement of the distal fracture fragment. Electronically Signed   By: Randa Ngo M.D.   On: 08/25/2020 17:32     Labs:   Basic Metabolic Panel: Recent Labs  Lab 08/28/20 0709 08/28/20 1724 08/29/20 0804 08/30/20 0214 08/31/20 0111 09/01/20 0247 09/02/20 0130  NA 137   < > 137 132* 133* 133* 134*  K 3.7   < > 4.1 3.9 3.8 3.7 4.0  CL 104  --  104 104 102 103 104  CO2 23  --  22 22 24 23 22   GLUCOSE 103*  --   134* 123* 104* 88 97  BUN 26*  --  34* 35* 26* 27* 25*  CREATININE 1.31*  --  1.15 1.11 1.00 0.99 1.05  CALCIUM 8.4*  --  8.0* 8.1* 7.9* 7.9* 8.0*  MG 2.1  --   --  2.6* 2.4 2.2 2.1   < > = values in this interval not displayed.   GFR Estimated Creatinine Clearance: 43.7 mL/min (by C-G formula based on SCr of 1.05 mg/dL). Liver Function Tests: No results for input(s): AST, ALT, ALKPHOS, BILITOT, PROT, ALBUMIN in the last 168 hours. No results for input(s): LIPASE, AMYLASE in the last 168 hours. No results for input(s): AMMONIA in the last 168 hours. Coagulation profile Recent Labs  Lab 08/29/20 0804 08/30/20 0214 08/31/20 0111 09/01/20 0247 09/02/20 0130  INR 1.4* 1.6* 2.1* 2.3* 2.5*    CBC: Recent Labs  Lab 08/28/20 0709 08/28/20 1724 08/29/20 0804 08/30/20 0214 08/31/20 0111 09/01/20 0247 09/02/20 0130  WBC 4.4  --  5.3 5.5 4.4 4.0 4.0  NEUTROABS 3.3  --  4.3 4.2 3.1  --  2.7  HGB 10.6*   < > 11.0* 10.7* 10.4* 10.0* 9.7*  HCT 31.6*   < > 32.4* 30.9* 31.3* 29.1* 28.1*  MCV 94.0  --  91.0 90.9 93.4 92.4 92.7  PLT 115*  --  106* 121* 122* 144* 164   < > = values in this interval not displayed.   Cardiac Enzymes: No results for input(s): CKTOTAL, CKMB, CKMBINDEX, TROPONINI in the last 168 hours. BNP: Invalid input(s): POCBNP CBG: No results for input(s): GLUCAP in the last 168 hours. D-Dimer No results for input(s): DDIMER in the last 72 hours. Hgb A1c No results for input(s): HGBA1C in the last 72 hours. Lipid Profile No results for input(s): CHOL, HDL, LDLCALC, TRIG, CHOLHDL, LDLDIRECT in the last 72 hours. Thyroid function studies No results for input(s): TSH, T4TOTAL, T3FREE, THYROIDAB in the last 72 hours.  Invalid input(s): FREET3 Anemia work up No results for input(s): VITAMINB12, FOLATE, FERRITIN, TIBC, IRON, RETICCTPCT in the last 72 hours. Microbiology Recent Results (from the past 240 hour(s))  Resp Panel by RT-PCR (Flu A&B, Covid) Nasopharyngeal  Swab     Status: None   Collection Time: 08/25/20  6:32 PM   Specimen: Nasopharyngeal Swab; Nasopharyngeal(NP) swabs in vial transport medium  Result Value Ref Range Status   SARS Coronavirus 2 by RT PCR NEGATIVE NEGATIVE Final    Comment: (NOTE) SARS-CoV-2 target nucleic acids are NOT DETECTED.  The SARS-CoV-2 RNA is generally detectable in upper respiratory specimens during the acute phase of infection. The lowest concentration of SARS-CoV-2 viral copies this assay can detect is 138 copies/mL. A negative result does not preclude SARS-Cov-2 infection and should not be used as the sole basis for treatment or other patient management decisions. A negative result  may occur with  improper specimen collection/handling, submission of specimen other than nasopharyngeal swab, presence of viral mutation(s) within the areas targeted by this assay, and inadequate number of viral copies(<138 copies/mL). A negative result must be combined with clinical observations, patient history, and epidemiological information. The expected result is Negative.  Fact Sheet for Patients:  EntrepreneurPulse.com.au  Fact Sheet for Healthcare Providers:  IncredibleEmployment.be  This test is no t yet approved or cleared by the [place] FDA and  has been authorized for detection and/or diagnosis of SARS-CoV-2 by FDA under an Emergency Use Authorization (EUA). This EUA will remain  in effect (meaning this test can be used) for the duration of the COVID-19 declaration under Section 564(b)(1) of the Act, 21 U.S.C.section 360bbb-3(b)(1), unless the authorization is terminated  or revoked sooner.       Influenza A by PCR NEGATIVE NEGATIVE Final   Influenza B by PCR NEGATIVE NEGATIVE Final    Comment: (NOTE) The Xpert Xpress SARS-CoV-2/FLU/RSV plus assay is intended as an aid in the diagnosis of influenza from Nasopharyngeal swab specimens and should not be used as a sole  basis for treatment. Nasal washings and aspirates are unacceptable for Xpert Xpress SARS-CoV-2/FLU/RSV testing.  Fact Sheet for Patients: EntrepreneurPulse.com.au  Fact Sheet for Healthcare Providers: IncredibleEmployment.be  This test is not yet approved or cleared by the [place] FDA and has been authorized for detection and/or diagnosis of SARS-CoV-2 by FDA under an Emergency Use Authorization (EUA). This EUA will remain in effect (meaning this test can be used) for the duration of the COVID-19 declaration under Section 564(b)(1) of the Act, 21 U.S.C. section 360bbb-3(b)(1), unless the authorization is terminated or revoked.  Performed at [facility], [place] [address] [place], [place] [address]   Surgical pcr screen     Status: None   Collection Time: 08/26/20  5:49 PM   Specimen: Nasal Mucosa; Nasal Swab  Result Value Ref Range Status   MRSA, PCR NEGATIVE NEGATIVE Final   Staphylococcus aureus NEGATIVE NEGATIVE Final    Comment: (NOTE) The Xpert SA Assay (FDA approved for NASAL specimens in patients 79 years of age and older), is one component of a comprehensive surveillance program. It is not intended to diagnose infection nor to guide or monitor treatment. Performed at [facility], [place] [address], [place], [place] [address]   SARS CORONAVIRUS 2 (TAT 6-24 HRS) Nasopharyngeal Nasopharyngeal Swab     Status: None   Collection Time: 09/01/20 10:33 AM   Specimen: Nasopharyngeal Swab  Result Value Ref Range Status   SARS Coronavirus 2 NEGATIVE NEGATIVE Final    Comment: (NOTE) SARS-CoV-2 target nucleic acids are NOT DETECTED.  The SARS-CoV-2 RNA is generally detectable in upper and lower respiratory specimens during the acute phase of infection. Negative results do not preclude SARS-CoV-2 infection, do not rule out co-infections with other pathogens, and should not be used as the sole basis for treatment or other  patient management decisions. Negative results must be combined with clinical observations, patient history, and epidemiological information. The expected result is Negative.  Fact Sheet for Patients: SugarRoll.be  Fact Sheet for Healthcare Providers: https://www.woods-mathews.com/  This test is not yet approved or cleared by the [place] FDA and  has been authorized for detection and/or diagnosis of SARS-CoV-2 by FDA under an Emergency Use Authorization (EUA). This EUA will remain  in effect (meaning this test can be used) for the duration of the COVID-19 declaration under Se ction 564(b)(1) of  the Act, 21 U.S.C. section 360bbb-3(b)(1), unless the authorization is terminated or revoked sooner.  Performed at [facility], [place] [address], [place], [place] [address]      Discharge Instructions:   Discharge Instructions    Call MD for:  severe uncontrolled pain   Complete by: As directed    Call MD for:  temperature >100.4   Complete by: As directed    Diet general   Complete by: As directed    Discharge instructions   Complete by: As directed    Follow-up with your primary care provider at the skilled nursing facility in 3 to 5 days follow-up with orthopedics Dr Lyla Glassing in 1 week for wound check/x-rays..  Continue to take your Coumadin.  Continue physical therapy.  INR goal 2.5-3.5.  Check INR, CBC in 3 to 5 days.   Discharge wound care:   Complete by: As directed    Surgical site care.   Increase activity slowly   Complete by: As directed      Allergies as of 09/02/2020      Reactions   Flexeril [cyclobenzaprine] Diarrhea      Medication List    TAKE these medications   bisoprolol 5 MG tablet Commonly known as: ZEBETA Take 1 tablet (5 mg total) by mouth daily.   docusate sodium 100 MG capsule Commonly known as: COLACE Take 1 capsule (100 mg total) by mouth 2 (two) times daily. While on narcotics.   Flonase  Allergy Relief 50 MCG/ACT nasal spray Generic drug: fluticasone Place 1 spray into both nostrils daily as needed for allergies or rhinitis.   folic acid 1 MG tablet Commonly known as: FOLVITE Take 1 tablet (1 mg total) by mouth daily. Start taking on: September 03, 2020   furosemide 40 MG tablet Commonly known as: LASIX TAKE 1 TABLET DAILY AND TAKE AN ADDITIONAL ONE-HALF TABLET THREE DAYS A WEEK What changed: See the new instructions.   hydrALAZINE 25 MG tablet Commonly known as: APRESOLINE Take 1 tablet (25 mg total) by mouth daily with lunch. Take extra tablet as needed for systolic BP greater than 419 mmHg. What changed:   when to take this  additional instructions   HYDROcodone-acetaminophen 5-325 MG tablet Commonly known as: NORCO/VICODIN Take 1-2 tablets by mouth every 4 (four) hours as needed for moderate pain (pain score 4-6).   isosorbide mononitrate 30 MG 24 hr tablet Commonly known as: IMDUR Take 1 tablet (30 mg total) by mouth daily.   LORazepam 1 MG tablet Commonly known as: ATIVAN Take 1 tablet (1 mg total) by mouth every 8 (eight) hours as needed for anxiety.   methocarbamol 500 MG tablet Commonly known as: ROBAXIN Take 1 tablet (500 mg total) by mouth every 6 (six) hours as needed for muscle spasms.   MULTIVITAMIN PO Take 1 tablet by mouth daily.   PRESERVISION AREDS 2 PO Take 1 capsule by mouth daily.   thiamine 100 MG tablet Take 1 tablet (100 mg total) by mouth daily. Start taking on: September 03, 2020   Tylenol 325 MG tablet Generic drug: acetaminophen Take 650 mg by mouth at bedtime.   warfarin 5 MG tablet Commonly known as: COUMADIN Take as directed. If you are unsure how to take this medication, talk to your nurse or doctor. Original instructions: Take 5 mg by mouth daily at 6 (six) AM.            Discharge Care Instructions  (From admission, onward)  Start     Ordered   09/02/20 0000  Discharge wound care:       Comments:  Surgical site care.   09/02/20 1003          Follow-up Information    Swinteck, Aaron Edelman, MD. Schedule an appointment as soon as possible for a visit in 1 week(s).   Specialty: Orthopedic Surgery Why: for post op followup, wound check, xray Contact information: [address] [place] [address] [phone]                Time coordinating discharge: 39 minutes  Signed:  Laxman Pokhrel  [facility] 09/02/2020, 10:53 AM

## 2020-09-02 NOTE — Discharge Instructions (Signed)
Dr. Rod Can Adult Hip & Knee Specialist [facility] [address] [place], [place] [address] [phone]   POSTOPERATIVE DIRECTIONS    Hip Rehabilitation, Guidelines Following Surgery   WEIGHT BEARING Partial weight bearing with assist device as directed.  touch down weight bearing left leg   HOME CARE INSTRUCTIONS  Remove items at home which could result in a fall. This includes throw rugs or furniture in walking pathways.  Continue medications as instructed at time of discharge.  You may have some home medications which will be placed on hold until you complete the course of blood thinner medication.  4 days after discharge, you may start showering. No tub baths or soaking your incisions. Do not put on socks or shoes without following the instructions of your caregivers.   Sit on chairs with arms. Use the chair arms to help push yourself up when arising.  Arrange for the use of a toilet seat elevator so you are not sitting low.   Walk with walker as instructed.  You may resume a sexual relationship in one month or when given the OK by your caregiver.  Use walker as long as suggested by your caregivers.  Avoid periods of inactivity such as sitting longer than an hour when not asleep. This helps prevent blood clots.  You may return to work once you are cleared by [employer], [employer].  Do not drive a car for 6 weeks or until released by your surgeon.  Do not drive while taking narcotics.  Wear elastic stockings for two weeks following surgery during the day but you may remove then at night.  Make sure you keep all of your appointments after your operation with all of your doctors and caregivers. You should call the office at the above phone number and make an appointment for approximately two weeks after the date of your surgery. Please pick up a stool softener and laxative for home use as long as you are requiring pain medications.  ICE to the  affected hip every three hours for 30 minutes at a time and then as needed for pain and swelling. Continue to use ice on the hip for pain and swelling from surgery. You may notice swelling that will progress down to the foot and ankle.  This is normal after surgery.  Elevate the leg when you are not up walking on it.   It is important for you to complete the blood thinner medication as prescribed by your doctor.  Continue to use the breathing machine which will help keep your temperature down.  It is common for your temperature to cycle up and down following surgery, especially at night when you are not up moving around and exerting yourself.  The breathing machine keeps your lungs expanded and your temperature down.  RANGE OF MOTION AND STRENGTHENING EXERCISES  These exercises are designed to help you keep full movement of your hip joint. Follow your caregiver's or physical therapist's instructions. Perform all exercises about fifteen times, three times per day or as directed. Exercise both hips, even if you have had only one joint replacement. These exercises can be done on a training (exercise) mat, on the floor, on a table or on a bed. Use whatever works the best and is most comfortable for you. Use music or television while you are exercising so that the exercises are a pleasant break in your day. This will make your life better with the exercises acting as a break in  routine you can look forward to.  Lying on your back, slowly slide your foot toward your buttocks, raising your knee up off the floor. Then slowly slide your foot back down until your leg is straight again.  Lying on your back spread your legs as far apart as you can without causing discomfort.  Lying on your side, raise your upper leg and foot straight up from the floor as far as is comfortable. Slowly lower the leg and repeat.  Lying on your back, tighten up the muscle in the front of your thigh (quadriceps muscles). You can do this by  keeping your leg straight and trying to raise your heel off the floor. This helps strengthen the largest muscle supporting your knee.  Lying on your back, tighten up the muscles of your buttocks both with the legs straight and with the knee bent at a comfortable angle while keeping your heel on the floor.   SKILLED REHAB INSTRUCTIONS: If the patient is transferred to a skilled rehab facility following release from the hospital, a list of the current medications will be sent to the facility for the patient to continue.  When discharged from the skilled rehab facility, please have the facility set up the patient's [place] prior to being released. Also, the skilled facility will be responsible for providing the patient with their medications at time of release from the facility to include their pain medication and their blood thinner medication. If the patient is still at the rehab facility at time of the two week follow up appointment, the skilled rehab facility will also need to assist the patient in arranging follow up appointment in our office and any transportation needs.  MAKE SURE YOU:  Understand these instructions.  Will watch your condition.  Will get help right away if you are not doing well or get worse.  Pick up stool softner and laxative for home use following surgery while on pain medications. Daily dry dressing changes as needed. In 4 days, you may remove your dressings and begin taking showers - no tub baths or soaking the incisions. Continue to use ice for pain and swelling after surgery. Do not use any lotions or creams on the incision until instructed by your surgeon.  Information on my medicine - Coumadin   (Warfarin)  This medication education was reviewed with me or my healthcare representative as part of my discharge preparation.   Why was Coumadin prescribed for you? Coumadin was prescribed for you because you have a blood clot or a medical condition  that can cause an increased risk of forming blood clots. Blood clots can cause serious health problems by blocking the flow of blood to the heart, lung, or brain. Coumadin can prevent harmful blood clots from forming. As a reminder your indication for Coumadin is:   Blood Clot Prevention After Heart Valve Surgery  What test will check on my response to Coumadin? While on Coumadin (warfarin) you will need to have an INR test regularly to ensure that your dose is keeping you in the desired range. The INR (international normalized ratio) number is calculated from the result of the laboratory test called prothrombin time (PT).  If an INR APPOINTMENT HAS NOT ALREADY BEEN MADE FOR YOU please schedule an appointment to have this lab work done by your health care provider within 7 days. Your INR goal is usually a number between:  2 to 3 or your provider may give you a more narrow range  like 2-2.5.  Ask your health care provider during an office visit what your goal INR is.  What  do you need to  know  About  COUMADIN? Take Coumadin (warfarin) exactly as prescribed by your healthcare provider about the same time each day.  DO NOT stop taking without talking to the doctor who prescribed the medication.  Stopping without other blood clot prevention medication to take the place of Coumadin may increase your risk of developing a new clot or stroke.  Get refills before you run out.  What do you do if you miss a dose? If you miss a dose, take it as soon as you remember on the same day then continue your regularly scheduled regimen the next day.  Do not take two doses of Coumadin at the same time.  Important Safety Information A possible side effect of Coumadin (Warfarin) is an increased risk of bleeding. You should call your healthcare provider right away if you experience any of the following: ? Bleeding from an injury or your nose that does not stop. ? Unusual colored urine (red or dark brown) or unusual  colored stools (red or black). ? Unusual bruising for unknown reasons. ? A serious fall or if you hit your head (even if there is no bleeding).  Some foods or medicines interact with Coumadin (warfarin) and might alter your response to warfarin. To help avoid this: ? Eat a balanced diet, maintaining a consistent amount of Vitamin K. ? Notify your provider about major diet changes you plan to make. ? Avoid alcohol or limit your intake to 1 drink for women and 2 drinks for men per day. (1 drink is 5 oz. wine, 12 oz. beer, or 1.5 oz. liquor.)  Make sure that ANY health care provider who prescribes medication for you knows that you are taking Coumadin (warfarin).  Also make sure the healthcare provider who is monitoring your Coumadin knows when you have started a new medication including herbals and non-prescription products.  Coumadin (Warfarin)  Major Drug Interactions  Increased Warfarin Effect Decreased Warfarin Effect  Alcohol (large quantities) Antibiotics (esp. Septra/Bactrim, Flagyl, Cipro) Amiodarone (Cordarone) Aspirin (ASA) Cimetidine (Tagamet) Megestrol (Megace) NSAIDs (ibuprofen, naproxen, etc.) Piroxicam (Feldene) Propafenone (Rythmol SR) Propranolol (Inderal) Isoniazid (INH) Posaconazole (Noxafil) Barbiturates (Phenobarbital) Carbamazepine (Tegretol) Chlordiazepoxide (Librium) Cholestyramine (Questran) Griseofulvin Oral Contraceptives Rifampin Sucralfate (Carafate) Vitamin K   Coumadin (Warfarin) Major Herbal Interactions  Increased Warfarin Effect Decreased Warfarin Effect  Garlic Ginseng Ginkgo biloba Coenzyme Q10 Green tea St. John's wort    Coumadin (Warfarin) FOOD Interactions  Eat a consistent number of servings per week of foods HIGH in Vitamin K (1 serving =  cup)  Collards (cooked, or boiled & drained) Kale (cooked, or boiled & drained) Mustard greens (cooked, or boiled & drained) Parsley *serving size only =  cup Spinach (cooked, or boiled  & drained) Swiss chard (cooked, or boiled & drained) Turnip greens (cooked, or boiled & drained)  Eat a consistent number of servings per week of foods MEDIUM-HIGH in Vitamin K (1 serving = 1 cup)  Asparagus (cooked, or boiled & drained) Broccoli (cooked, boiled & drained, or raw & chopped) Brussel sprouts (cooked, or boiled & drained) *serving size only =  cup Lettuce, raw (green leaf, endive, romaine) Spinach, raw Turnip greens, raw & chopped   These websites have more information on Coumadin (warfarin):  FailFactory.se; VeganReport.com.au;

## 2020-09-02 NOTE — Progress Notes (Signed)
Called and report given to Khidja, RN. willl send discharge package with PTAR.

## 2020-09-02 NOTE — Plan of Care (Signed)
  Problem: Pain Managment: Goal: General experience of comfort will improve Outcome: Progressing   Problem: Safety: Goal: Ability to remain free from injury will improve Outcome: Progressing   

## 2020-09-02 NOTE — Progress Notes (Signed)
ANTICOAGULATION CONSULT NOTE - Follow Up Consult  Pharmacy Consult for Heparin, warfarin  Indication: Atrial Fibrillation, St Jude mech MVR  Allergies  Allergen Reactions  . Flexeril [Cyclobenzaprine] Diarrhea   Heparin Dosing Weight: 70.3 kg  Vital Signs: Temp: 98.5 F (36.9 C) (03/08 0331) Temp Source: Oral (03/08 0331) BP: 124/74 (03/08 0331) Pulse Rate: 88 (03/08 0331)  Labs: Recent Labs    08/31/20 0111 09/01/20 0247 09/02/20 0130  HGB 10.4* 10.0* 9.7*  HCT 31.3* 29.1* 28.1*  PLT 122* 144* 164  LABPROT 23.0* 24.6* 26.1*  INR 2.1* 2.3* 2.5*  HEPARINUNFRC 0.37 0.48 0.24*  CREATININE 1.00 0.99 1.05    Estimated Creatinine Clearance: 43.7 mL/min (by C-G formula based on SCr of 1.05 mg/dL).  Assessment: 36 yom on warfarin PTA for atrial fibrillation and St Jude MVR (INR goal 2.5-3.5) admitted after fall at home. Warfarin on hold in anticipation of surgery for hip fracture, received 5mg  PO vitamin K on 3/1. Patient has hematoma on LUE from fall. He is now s/p ORIF L femur 3/3 PM. Pharmacy consulted to restart warfarin and bridge with heparin infusion until INR therapeutic.  Heparin level this morning is SUBtherapeutic (HL down to 0.24, goal of 0.3-0.7), but INR is therapeutic (INR 2.5 << 2.3, goal of 2.5-3.5). Hgb slow trend down at 9.7 today, plts up to 164.    PTA warfarin dose is 5 mg daily.  Goal of Therapy:  Heparin level 0.3-0.7 units/ml  INR 2.5-3.5 Monitor platelets by anticoagulation protocol: Yes   Plan:  - Continue Warfarin 5mg  PO daily - patient to discharge. - Discontinuation of heparin infusion now that INR at goal - Monitor daily heparin level, INR/CBC, s/sx bleeding  Thank you for allowing pharmacy to be a part of this patient's care.  Sloan Leiter, PharmD, BCPS, BCCCP Clinical Pharmacist Clinical phone for 09/02/2020: [phone] 09/02/2020 7:35 AM   **Pharmacist phone directory can now be found on [email or web address] ([email or web address]).  Listed under [place].

## 2020-09-03 ENCOUNTER — Encounter: Payer: Self-pay | Admitting: Nurse Practitioner

## 2020-09-03 ENCOUNTER — Other Ambulatory Visit: Payer: Self-pay

## 2020-09-03 ENCOUNTER — Non-Acute Institutional Stay (SKILLED_NURSING_FACILITY): Payer: Medicare Other | Admitting: Nurse Practitioner

## 2020-09-03 DIAGNOSIS — I10 Essential (primary) hypertension: Secondary | ICD-10-CM | POA: Diagnosis not present

## 2020-09-03 DIAGNOSIS — K5901 Slow transit constipation: Secondary | ICD-10-CM

## 2020-09-03 DIAGNOSIS — Y92009 Unspecified place in unspecified non-institutional (private) residence as the place of occurrence of the external cause: Secondary | ICD-10-CM | POA: Diagnosis not present

## 2020-09-03 DIAGNOSIS — N400 Enlarged prostate without lower urinary tract symptoms: Secondary | ICD-10-CM

## 2020-09-03 DIAGNOSIS — M159 Polyosteoarthritis, unspecified: Secondary | ICD-10-CM

## 2020-09-03 DIAGNOSIS — W19XXXA Unspecified fall, initial encounter: Secondary | ICD-10-CM | POA: Diagnosis not present

## 2020-09-03 DIAGNOSIS — S72442D Displaced fracture of lower epiphysis (separation) of left femur, subsequent encounter for closed fracture with routine healing: Secondary | ICD-10-CM | POA: Diagnosis not present

## 2020-09-03 DIAGNOSIS — D539 Nutritional anemia, unspecified: Secondary | ICD-10-CM

## 2020-09-03 DIAGNOSIS — Z952 Presence of prosthetic heart valve: Secondary | ICD-10-CM

## 2020-09-03 DIAGNOSIS — I4811 Longstanding persistent atrial fibrillation: Secondary | ICD-10-CM

## 2020-09-03 NOTE — Assessment & Plan Note (Signed)
S/P MVR 1008 goa of INR 2.5-3.5, takes Coumadin  

## 2020-09-03 NOTE — Assessment & Plan Note (Signed)
OA s/p R+L hip arhtroplasties  

## 2020-09-03 NOTE — Assessment & Plan Note (Addendum)
Continue Colace, adding MiraLax qd

## 2020-09-03 NOTE — Assessment & Plan Note (Signed)
BPH no urinary retention.  

## 2020-09-03 NOTE — Assessment & Plan Note (Signed)
PAF takes Coumadin, Bisoprolol.

## 2020-09-03 NOTE — Assessment & Plan Note (Signed)
Macrocytic anemia, post op, transfused PRBC total 7u, Iron 60, Vit B12 478, Folate 44. Hgb 9.7 09/02/20

## 2020-09-03 NOTE — Progress Notes (Signed)
Location:    [place] [place] Room Number: 29 Place of Service:  SNF [phone]) Provider:  Marlana Latus NP  [name], MD  Patient Care Team: [name], MD as PCP - General (Internal Medicine)  Extended Emergency Contact Information Primary Emergency Contact: [place], [place] Mobile Phone: [phone] Relation: Son Secondary Emergency Contact: [facility] Address: [address]          [place]  [place] Home Phone: [phone] Relation: Spouse  Code Status:  Full Code Goals of care: Advanced Directive information Advanced Directives 08/28/2020  Does Patient Have a Medical Advance Directive? No  Type of Advance Directive -  Does patient want to make changes to medical advance directive? -  Copy of Bee in Chart? -  Would patient like information on creating a medical advance directive? No - Patient declined     Chief Complaint  Patient presents with  . Acute Visit    Medication review    HPI:  Pt is a 87 y.o. male seen today for an acute visit for medication review following hospital stay.   Hospitalized 08/25/20-3/822 for closed displaced fracture of distal epiphysis of left femur, ORIF 08/28/20, TDW with walker, f/u Ortho one week for Xray wound check  S/P MVR 1008 goa of INR 2.5-3.5, takes Coumadin  BPH no urinary retention.   OA s/p R+L hip arhtroplasties  PAF takes Coumadin, Bisoprolol.   HTN takes Bisoprolol, Furosemide, Hydralazine, Isosorbide. Na 134, Bun/creat 25/1.05 09/02/20  Macrocytic anemia, post op, transfused PRBC total 7u, Iron 60, Vit B12 478, Folate 44. Hgb 9.7 09/02/20  Fall, needs assistance with transfer.   Constipation, takes Colace   Past Medical History:  Diagnosis Date  . Anemia    leakoppenia  . BPH (benign prostatic hypertrophy)   . Bullous pemphigoid    [name], March 2011, right forearm squamous cell carcinoma  . Chronic anticoagulation    systemic  . Colon polyp    transverse,  2002  . History of peptic ulcer    remote, 3/95  . Hx of actinic keratosis   . Hx of basal cell carcinoma   . Hx of squamous cell carcinoma of skin   . Hyperlipidemia   . Left inguinal hernia   . Moderate aortic insufficiency 2009   audible aortic insufficiency on 1/09 echo  . PAF (paroxysmal atrial fibrillation) ([place]) 01/17/2014   On Warfarin.  . S/P mitral valve replacement with metallic valve 89/3810   INR goal 2.5-3.5, St Jude,   . 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 Electrodesiccation and Curettage and Shave Biopsy Right    Right medial, anterio tibia: Well differentiated Squamous Cell  . hip replacements Left    10 years ago  . MITRAL VALVE REPLACEMENT  03/1996   St. Jude mechanical valve  . ORIF FEMUR FRACTURE Left 08/28/2020   Procedure: OPEN REDUCTION INTERNAL FIXATION (ORIF) DISTAL FEMUR FRACTURE;  Surgeon: Rod Can, MD;  Location: [facility];  Service: Orthopedics;  Laterality: Left;  . TOTAL HIP ARTHROPLASTY Right 10/12/2017   Procedure: RIGHT TOTAL HIP ARTHROPLASTY ANTERIOR APPROACH;  Surgeon: Gaynelle Arabian, MD;  Location: [facility];  Service: Orthopedics;  Laterality: Right;  . TRANSTHORACIC ECHOCARDIOGRAM  12/2018   Unable to assess diastolic function because of A. fib. Normal RV function, but moderately elevated RVSP.  Severe biatrial enlargement. S/P St Jude bileaflet mechanical MVR  that appears to be functioning normally. Mitral valve regurgitation cannot assess due to mechanical valve shadowing. MV Mean grad: 7.0 mmHg MV Area (PHT): 3.38 cm (stable for valve).  Mild Ao Sclerosis, Mild-Mod AI  . TRANSTHORACIC ECHOCARDIOGRAM  08/'17; 10/'18    a) Mild conc LVH. EF 55-60%. No RWMA. Mod AI. Mechanical MV prosthesis functioning properly. LAD dilation.;; b)  EF 55-60%.  Mo AI.  Bileaflet Saint Jude mechanical MV with no paravalvular leak.  Severe LA dilation.  Minimally elevated PAP     Allergies  Allergen Reactions  . Flexeril [Cyclobenzaprine] Diarrhea    Allergies as of 09/03/2020      Reactions   Flexeril [cyclobenzaprine] Diarrhea      Medication List       Accurate as of September 03, 2020  3:13 PM. If you have any questions, ask your nurse or doctor.        acetaminophen 325 MG tablet Commonly known as: TYLENOL Take 650 mg by mouth at bedtime.   acetaminophen 325 MG tablet Commonly known as: TYLENOL Take 650 mg by mouth as needed.   bisoprolol 5 MG tablet Commonly known as: ZEBETA Take 1 tablet (5 mg total) by mouth daily.   docusate sodium 100 MG capsule Commonly known as: COLACE Take 1 capsule (100 mg total) by mouth 2 (two) times daily. While on narcotics.   fluticasone 50 MCG/ACT nasal spray Commonly known as: FLONASE Place 1 spray into both nostrils daily as needed for allergies or rhinitis.   folic acid 1 MG tablet Commonly known as: FOLVITE Take 1 tablet (1 mg total) by mouth daily.   furosemide 40 MG tablet Commonly known as: LASIX Take 40 mg by mouth. Once A Day on Sun, Tue, Thu, Sat What changed: Another medication with the same name was removed. Continue taking this medication, and follow the directions you see here. Changed by: Prentiss Hammett X Gabbrielle Mcnicholas, NP   furosemide 40 MG tablet Commonly known as: LASIX Take 60 mg by mouth. Once A Day on Mon, Wed, Fri What changed: Another medication with the same name was removed. Continue taking this medication, and follow the directions you see here. Changed by: Katheen Aslin X Braven Wolk, NP   hydrALAZINE 25 MG tablet Commonly known as: APRESOLINE Take 25 mg by mouth. Take 1 tablet with lunch. Take extra tablet as needed for systolic BP greater than 326 mmHG. Once A Day What changed: Another medication with the same name was removed. Continue taking this medication, and follow the directions you see here. Changed by: Eula Mazzola X Jylian Pappalardo, NP   hydrALAZINE 25 MG tablet Commonly known as: APRESOLINE Take 25 mg by mouth.  Take an extra tab of 25 mg hydralazine as needed for systolic BP greater than 712 mmHg Once A Day - PRN What changed: Another medication with the same name was removed. Continue taking this medication, and follow the directions you see here. Changed by: Donnice Nielsen X Cleveland Paiz, NP   HYDROcodone-acetaminophen 5-325 MG tablet Commonly known as: NORCO/VICODIN Take 1-2 tablets by mouth every 4 (four) hours as needed for moderate pain (pain score 4-6).   isosorbide mononitrate 30 MG 24 hr tablet Commonly known as: IMDUR Take 1 tablet (30 mg total) by mouth daily.   LORazepam 1 MG tablet Commonly known as: ATIVAN Take 1 tablet (1 mg total) by mouth every 8 (eight) hours as needed for anxiety.   methocarbamol 500 MG tablet Commonly known as: ROBAXIN Take 1 tablet (500 mg total) by mouth every 6 (six)  hours as needed for muscle spasms.   MULTIVITAMIN PO Take 1 tablet by mouth daily.   PRESERVISION AREDS 2 PO Take 1 capsule by mouth daily.   thiamine 100 MG tablet Take 1 tablet (100 mg total) by mouth daily.   warfarin 5 MG tablet Commonly known as: COUMADIN Take as directed by the anticoagulation clinic. If you are unsure how to take this medication, talk to your nurse or doctor. Original instructions: Take 5 mg by mouth daily at 6 (six) AM.   zinc oxide 20 % ointment Apply 1 application topically as needed for irritation.       Review of Systems  Constitutional: Positive for fatigue. Negative for activity change, appetite change and fever.  HENT: Positive for hearing loss. Negative for congestion and voice change.   Respiratory: Negative for cough, shortness of breath and wheezing.   Cardiovascular: Positive for leg swelling.  Gastrointestinal: Positive for constipation. Negative for abdominal pain, nausea and vomiting.  Genitourinary: Negative for difficulty urinating, dysuria and urgency.       Incontinent of urine, uses condom catheter.   Musculoskeletal: Positive for arthralgias,  gait problem and joint swelling.  Skin: Positive for wound.  Neurological: Negative for speech difficulty, weakness, light-headedness and headaches.       Memory lapses.   Psychiatric/Behavioral: Negative for behavioral problems and sleep disturbance. The patient is not nervous/anxious.     Immunization History  Administered Date(s) Administered  . Influenza, High Dose Seasonal PF 04/20/2016  . Influenza,inj,Quad PF,6+ Mos 03/28/2018  . Influenza-Unspecified 03/30/2017  . Pneumococcal Conjugate-13 06/10/2014  . Pneumococcal-Unspecified 02/02/2006  . Tdap 06/10/2014  . Zoster 03/29/2015   Pertinent  Health Maintenance Due  Topic Date Due  . INFLUENZA VACCINE  Completed  . PNA vac Low Risk Adult  Completed   No flowsheet data found. Functional Status Survey:    Vitals:   09/03/20 0858  BP: 131/70  Pulse: 97  Resp: 18  Temp: 97.6 F (36.4 C)  SpO2: 95%  Weight: 155 lb 8 oz (70.5 kg)  Height: 6' (1.829 m)   Body mass index is 21.09 kg/m. Physical Exam Vitals and nursing note reviewed.  Constitutional:      General: He is not in acute distress.    Appearance: Normal appearance. He is not ill-appearing, toxic-appearing or diaphoretic.  HENT:     Head: Normocephalic and atraumatic.     Nose: Nose normal.     Mouth/Throat:     Mouth: Mucous membranes are moist.  Eyes:     Extraocular Movements: Extraocular movements intact.     Conjunctiva/sclera: Conjunctivae normal.     Pupils: Pupils are equal, round, and reactive to light.  Cardiovascular:     Rate and Rhythm: Normal rate. Rhythm irregular.     Heart sounds: Murmur heard.    Pulmonary:     Effort: Pulmonary effort is normal.     Breath sounds: No wheezing or rales.  Abdominal:     General: Bowel sounds are normal. There is no distension.     Palpations: Abdomen is soft.     Tenderness: There is no abdominal tenderness. There is no right CVA tenderness, left CVA tenderness or guarding.  Musculoskeletal:         General: Tenderness present.     Cervical back: Normal range of motion and neck supple.     Right lower leg: Edema present.     Left lower leg: Edema present.     Comments: LLE>RLE  Skin:  General: Skin is warm and dry.     Comments: Left hip surgical wound is covered with dressing during my examination, excessive serous drainage saturated abd pad and bedding. Dark pigmented venous insufficiency skin changes BLE  Neurological:     General: No focal deficit present.     Mental Status: He is alert and oriented to person, place, and time. Mental status is at baseline.     Motor: No weakness.     Coordination: Coordination normal.     Gait: Gait abnormal.  Psychiatric:        Mood and Affect: Mood normal.        Behavior: Behavior normal.        Thought Content: Thought content normal.        Judgment: Judgment normal.     Labs reviewed: Recent Labs    08/31/20 0111 09/01/20 0247 09/02/20 0130  NA 133* 133* 134*  K 3.8 3.7 4.0  CL 102 103 104  CO2 _0 GLUCOSE 104* 88 97  BUN 26* 27* 25*  CREATININE 1.00 0.99 1.05  CALCIUM 7.9* 7.9* 8.0*  MG 2.4 2.2 2.1   Recent Labs    08/25/20 1832 08/26/20 0332  AST 39 34  ALT 24 21  ALKPHOS 54 42  BILITOT 1.1 1.1  PROT 5.8* 5.3*  ALBUMIN 3.4* 2.9*   Recent Labs    08/30/20 0214 08/31/20 0111 09/01/20 0247 09/02/20 0130  WBC 5.5 4.4 4.0 4.0  NEUTROABS 4.2 3.1  --  2.7  HGB 10.7* 10.4* 10.0* 9.7*  HCT 30.9* 31.3* 29.1* 28.1*  MCV 90.9 93.4 92.4 92.7  PLT 121* 122* 144* 164   No results found for: TSH No results found for: HGBA1C No results found for: CHOL, HDL, LDLCALC, LDLDIRECT, TRIG, CHOLHDL  Significant Diagnostic Results in last 30 days:  DG Pelvis 1-2 Views  Result Date: 08/25/2020 CLINICAL DATA:  [place], left femoral deformity EXAM: PELVIS - 1-2 VIEW COMPARISON:  10/12/2017 FINDINGS: Single frontal view of the pelvis excludes portions of the proximal right femur and right iliac crest by collimation.  Bilateral hip arthroplasties are identified. There are no acute displaced pelvic fractures. Soft tissues are unremarkable. Bowel gas pattern is normal. IMPRESSION: 1. Bilateral hip arthroplasties as above.  No acute pelvic fracture. Electronically Signed   By: Randa Ngo M.D.   On: 08/25/2020 17:30   DG Chest Portable 1 View  Result Date: 08/25/2020 CLINICAL DATA:  Left femur fracture, fell EXAM: PORTABLE CHEST 1 VIEW COMPARISON:  10/09/2017 FINDINGS: Single frontal view of the chest demonstrates marked enlargement the cardiac silhouette, stable. Postsurgical changes from median sternotomy and valvular replacement. Mild chronic central vascular congestion without airspace disease, effusion, or pneumothorax. There are no acute displaced fractures. IMPRESSION: 1. Stable enlarged cardiac silhouette.  No acute process. Electronically Signed   By: Randa Ngo M.D.   On: 08/25/2020 17:32   DG C-Arm 1-60 Min  Result Date: 08/28/2020 CLINICAL DATA:  Surgery, elective. Additional history provided: Left femur. Provided fluoroscopy time 2 minutes, 10 seconds. EXAM: LEFT FEMUR 2 VIEWS; DG C-ARM 1-60 MIN COMPARISON:  Radiographs of the left femur 08/25/2020. FINDINGS: Nine intraoperative fluoroscopic images of the left femur are submitted. The images demonstrate interval ORIF of an acute left femoral diaphyseal fracture utilizing a lateral plate and screws as well as cerclage wires. Alignment is essentially anatomic. Partially imaged left hip arthroplasty. IMPRESSION: Nine intraoperative fluoroscopic images from ORIF of a left femoral diaphyseal fracture, as described. Electronically  Signed   By: Kellie Simmering DO   On: 08/28/2020 17:33   DG FEMUR MIN 2 VIEWS LEFT  Result Date: 08/28/2020 CLINICAL DATA:  Surgery, elective. Additional history provided: Left femur. Provided fluoroscopy time 2 minutes, 10 seconds. EXAM: LEFT FEMUR 2 VIEWS; DG C-ARM 1-60 MIN COMPARISON:  Radiographs of the left femur 08/25/2020.  FINDINGS: Nine intraoperative fluoroscopic images of the left femur are submitted. The images demonstrate interval ORIF of an acute left femoral diaphyseal fracture utilizing a lateral plate and screws as well as cerclage wires. Alignment is essentially anatomic. Partially imaged left hip arthroplasty. IMPRESSION: Nine intraoperative fluoroscopic images from ORIF of a left femoral diaphyseal fracture, as described. Electronically Signed   By: Kellie Simmering DO   On: 08/28/2020 17:33   DG FEMUR MIN 2 VIEWS LEFT  Result Date: 08/25/2020 CLINICAL DATA:  [place], left femur deformity EXAM: LEFT FEMUR 2 VIEWS COMPARISON:  None. FINDINGS: Frontal and cross-table lateral views of the left femur are obtained. Left hip arthroplasty is identified in the expected position. There is an oblique spiral fracture of the distal left femoral diaphysis, with medial displacement and varus angulation at the fracture site. Proximal migration of the distal fracture fragment by approximately 5 cm results in foreshortening of the left leg. There is diffuse soft tissue swelling. IMPRESSION: 1. Acute spiral fracture of the distal left femoral diaphysis, with valgus angulation as well as medial and proximal displacement of the distal fracture fragment. Electronically Signed   By: Randa Ngo M.D.   On: 08/25/2020 17:32   DG FEMUR PORT MIN 2 VIEWS LEFT  Result Date: 08/28/2020 CLINICAL DATA:  Postop fracture EXAM: LEFT FEMUR PORTABLE 2 VIEWS COMPARISON:  08/25/2020 FINDINGS: Previous left hip replacement with normal alignment. Interval surgical plate and multiple screw fixation as well as cerclage wire fixation of distal femoral fracture, now with anatomic alignment. Gas within the soft tissues consistent with recent surgery. Surgical drain within the thigh. IMPRESSION: Interval surgical plate and screw fixation of distal femoral fracture with anatomic alignment. Electronically Signed   By: Donavan Foil M.D.   On: 08/28/2020 19:20     Assessment/Plan Slow transit constipation Continue Colace, adding MiraLax qd  Closed displaced fracture of distal epiphysis of left femur ([place]) Hospitalized 08/25/20-3/822 for closed displaced fracture of distal epiphysis of left femur, ORIF 08/28/20, TDW with walker, f/u Ortho one week for Xray wound check Tylenol, Norco, Methocarbamol for pain.  Contact Ortho for excessive serous drainage of the left hip surgical site for further care instructions.   S/P MVR (mitral valve replacement) S/P MVR 1008 goa of INR 2.5-3.5, takes Coumadin  BPH (benign prostatic hyperplasia) BPH no urinary retention.   Generalized osteoarthritis of multiple sites OA s/p R+L hip arhtroplasties   Longstanding persistent atrial fibrillation: CHA2DS2-VASc Score 3 PAF takes Coumadin, Bisoprolol.   Essential hypertension HTN takes Bisoprolol, Furosemide, Hydralazine, Isosorbide. Na 134, Bun/creat 25/1.05 09/02/20   Macrocytic anemia Macrocytic anemia, post op, transfused PRBC total 7u, Iron 60, Vit B12 478, Folate 44. Hgb 9.7 09/02/20   Fall at home, initial encounter Needs assistance for transfer.     Family/ staff Communication: plan of care reviewed with the patient and charge nurse.   Labs/tests ordered:  PT/INR, CBC/diff, CMP/eGFR  Time spend 35 minutes.

## 2020-09-03 NOTE — Assessment & Plan Note (Signed)
Needs assistance for transfer.

## 2020-09-03 NOTE — Assessment & Plan Note (Signed)
HTN takes Bisoprolol, Furosemide, Hydralazine, Isosorbide. Na 134, Bun/creat 25/1.05 09/02/20

## 2020-09-03 NOTE — Telephone Encounter (Addendum)
Incoming fax received from [facility] for Lorazepam and Hydrocodone, message was made on fax indication patient is no longer under the care of [facility] and RX request will need to be sent to PCP

## 2020-09-03 NOTE — Assessment & Plan Note (Addendum)
Hospitalized 08/25/20-3/822 for closed displaced fracture of distal epiphysis of left femur, ORIF 08/28/20, TDW with walker, f/u Ortho one week for Xray wound check Tylenol, Norco, Methocarbamol for pain.  Contact Ortho for excessive serous drainage of the left hip surgical site for further care instructions.

## 2020-09-04 DIAGNOSIS — D649 Anemia, unspecified: Secondary | ICD-10-CM | POA: Diagnosis not present

## 2020-09-08 LAB — CBC AND DIFFERENTIAL
HCT: 32 — AB (ref 41–53)
Hemoglobin: 10.8 — AB (ref 13.5–17.5)
Neutrophils Absolute: 3729
Platelets: 366 (ref 150–399)
WBC: 4.9

## 2020-09-08 LAB — HEPATIC FUNCTION PANEL
ALT: 47 — AB (ref 10–40)
AST: 50 — AB (ref 14–40)
Alkaline Phosphatase: 98 (ref 25–125)
Bilirubin, Total: 1.4

## 2020-09-08 LAB — COMPREHENSIVE METABOLIC PANEL
Albumin: 3 — AB (ref 3.5–5.0)
Calcium: 8.4 — AB (ref 8.7–10.7)
Globulin: 2.5

## 2020-09-08 LAB — BASIC METABOLIC PANEL
BUN: 25 — AB (ref 4–21)
CO2: 25 — AB (ref 13–22)
Chloride: 101 (ref 99–108)
Creatinine: 1.1 (ref 0.6–1.3)
Glucose: 88
Potassium: 4.6 (ref 3.4–5.3)
Sodium: 134 — AB (ref 137–147)

## 2020-09-08 LAB — CBC: RBC: 3.39 — AB (ref 3.87–5.11)

## 2020-09-11 ENCOUNTER — Encounter: Payer: Self-pay | Admitting: Internal Medicine

## 2020-09-11 ENCOUNTER — Non-Acute Institutional Stay (SKILLED_NURSING_FACILITY): Payer: Medicare Other | Admitting: Internal Medicine

## 2020-09-11 DIAGNOSIS — I10 Essential (primary) hypertension: Secondary | ICD-10-CM | POA: Diagnosis not present

## 2020-09-11 DIAGNOSIS — D539 Nutritional anemia, unspecified: Secondary | ICD-10-CM | POA: Diagnosis not present

## 2020-09-11 DIAGNOSIS — S72442D Displaced fracture of lower epiphysis (separation) of left femur, subsequent encounter for closed fracture with routine healing: Secondary | ICD-10-CM | POA: Diagnosis not present

## 2020-09-11 DIAGNOSIS — N400 Enlarged prostate without lower urinary tract symptoms: Secondary | ICD-10-CM | POA: Diagnosis not present

## 2020-09-11 DIAGNOSIS — I4811 Longstanding persistent atrial fibrillation: Secondary | ICD-10-CM | POA: Diagnosis not present

## 2020-09-11 DIAGNOSIS — M159 Polyosteoarthritis, unspecified: Secondary | ICD-10-CM

## 2020-09-11 DIAGNOSIS — Z952 Presence of prosthetic heart valve: Secondary | ICD-10-CM

## 2020-09-11 NOTE — Progress Notes (Signed)
Provider:  Veleta Miners MD Location:    [facility] Room Number: 29 Place of Service:  SNF (31)  PCP: [name], MD Patient Care Team: [name], MD as PCP - General (Internal Medicine)  Extended Emergency Contact Information Primary Emergency Contact: Rishan, Oyama Mobile Phone: [phone] Relation: Son Secondary Emergency Contact: [facility] Address: [address]          [place]  [place] Phone: [phone] Relation: Spouse  Code Status: Full Code Goals of Care: Advanced Directive information Advanced Directives 09/11/2020  Does Patient Have a Medical Advance Directive? Yes  Type of Advance Directive Out of facility DNR (pink MOST or yellow form);Living will  Does patient want to make changes to medical advance directive? No - Patient declined  Copy of [place] in Chart? -  Would patient like information on creating a medical advance directive? -  Pre-existing out of facility DNR order (yellow form or pink MOST form) Pink MOST form placed in chart (order not valid for inpatient use);Yellow form placed in chart (order not valid for inpatient use)      Chief Complaint  Patient presents with  . New Admit To SNF    Admission to SNF    HPI: Patient is a 87 y.o. male seen today for admission to SNF For therapy  Admitted in the hospital from 2/28-3/8 Closed displaced fracture of distal epiphysis of left femur  Patient has a history of PAF, mechanical mitral valve is 97 on Coumadin, BPH, previous history of right and left hip arthroplasty, hypertension.  Patient had a mechanical fall when he was with his wife outside.  Came to ED fair he was found to have acute  fracture of distal left femoral diaphysis with displacement Underwent ORIF on 3/3 Is on touchdown weightbearing Doing well in SNF therapy making slow progress.  Appetite seems to be poor trying to eat.  Pain seems to be controlled.  Did not have  any acute issues today.  Past Medical History:  Diagnosis Date  . Anemia    leakoppenia  . BPH (benign prostatic hypertrophy)   . Bullous pemphigoid    [name], March 2011, right forearm squamous cell carcinoma  . Chronic anticoagulation    systemic  . Colon polyp    transverse, 2002  . History of peptic ulcer    remote, 3/95  . Hx of actinic keratosis   . Hx of basal cell carcinoma   . Hx of squamous cell carcinoma of skin   . Hyperlipidemia   . Left inguinal hernia   . Moderate aortic insufficiency 2009   audible aortic insufficiency on 1/09 echo  . PAF (paroxysmal atrial fibrillation) ([place]) 01/17/2014   On Warfarin.  . S/P mitral valve replacement with metallic valve 84/6962   INR goal 2.5-3.5, St Jude,   . 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 Electrodesiccation and Curettage and Shave Biopsy Right    Right medial, anterio tibia: Well differentiated Squamous Cell  . hip replacements Left    10 years ago  . MITRAL VALVE REPLACEMENT  03/1996   St. Jude mechanical valve  . ORIF FEMUR FRACTURE Left 08/28/2020   Procedure: OPEN REDUCTION INTERNAL FIXATION (ORIF) DISTAL FEMUR FRACTURE;  Surgeon: Rod Can, MD;  Location: [facility];  Service: Orthopedics;  Laterality: Left;  . TOTAL HIP ARTHROPLASTY Right 10/12/2017  Procedure: RIGHT TOTAL HIP ARTHROPLASTY ANTERIOR APPROACH;  Surgeon: Gaynelle Arabian, MD;  Location: [facility];  Service: Orthopedics;  Laterality: Right;  . TRANSTHORACIC ECHOCARDIOGRAM  12/2018   Unable to assess diastolic function because of A. fib. Normal RV function, but moderately elevated RVSP.  Severe biatrial enlargement. S/P St Jude bileaflet mechanical MVR that appears to be functioning normally. Mitral valve regurgitation cannot assess due to mechanical valve shadowing. MV Mean grad: 7.0 mmHg MV Area (PHT): 3.38 cm (stable for valve).  Mild Ao Sclerosis, Mild-Mod  AI  . TRANSTHORACIC ECHOCARDIOGRAM  08/'17; 10/'18    a) Mild conc LVH. EF 55-60%. No RWMA. Mod AI. Mechanical MV prosthesis functioning properly. LAD dilation.;; b)  EF 55-60%.  Mo AI.  Bileaflet Saint Jude mechanical MV with no paravalvular leak.  Severe LA dilation.  Minimally elevated PAP    reports that he quit smoking about 62 years ago. His smoking use included cigarettes. He started smoking about 75 years ago. He has a 13.00 pack-year smoking history. He has never used smokeless tobacco. He reports current alcohol use. He reports that he does not use drugs. Social History   Socioeconomic History  . Marital status: Married    Spouse name: Not on file  . Number of children: 2  . Years of education: Not on file  . Highest education level: Not on file  Occupational History  . Occupation: retired  Tobacco Use  . Smoking status: Former Smoker    Packs/day: 1.00    Years: 13.00    Pack years: 13.00    Types: Cigarettes    Start date: 60    Quit date: 1960    Years since quitting: 62.2  . Smokeless tobacco: Never Used  Vaping Use  . Vaping Use: Never used  Substance and Sexual Activity  . Alcohol use: Yes    Alcohol/week: 0.0 standard drinks    Comment: 1-2 drinks per day  . Drug use: Never  . Sexual activity: Not on file  Other Topics Concern  . Not on file  Social History Narrative   Patient lives at [facility], With his wife Meryl Crutch.   Social Determinants of Health   Financial Resource Strain: Not on file  Food Insecurity: Not on file  Transportation Needs: Not on file  Physical Activity: Not on file  Stress: Not on file  Social Connections: Not on file  Intimate Partner Violence: Not on file    Functional Status Survey:    Family History  Problem Relation Age of Onset  . Hypertension Mother   . Lung cancer Sister   . COPD Brother   . Cancer Brother   . Other Sister        polio  . Lupus Son     Health Maintenance  Topic Date Due  .  COVID-19 Vaccine (1) Never done  . TETANUS/TDAP  06/10/2024  . INFLUENZA VACCINE  Completed  . PNA vac Low Risk Adult  Completed  . HPV VACCINES  Aged Out    Allergies  Allergen Reactions  . Flexeril [Cyclobenzaprine] Diarrhea    Allergies as of 09/11/2020      Reactions   Flexeril [cyclobenzaprine] Diarrhea      Medication List       Accurate as of September 11, 2020 10:44 AM. If you have any questions, ask your nurse or doctor.        STOP taking these medications   warfarin 5 MG tablet Commonly known as: COUMADIN  Stopped by: Virgie Dad, MD     TAKE these medications   acetaminophen 325 MG tablet Commonly known as: TYLENOL Take 650 mg by mouth in the morning, at noon, and at bedtime.   bisoprolol 5 MG tablet Commonly known as: ZEBETA Take 1 tablet (5 mg total) by mouth daily.   docusate sodium 100 MG capsule Commonly known as: COLACE Take 1 capsule (100 mg total) by mouth 2 (two) times daily. While on narcotics.   fluticasone 50 MCG/ACT nasal spray Commonly known as: FLONASE Place 1 spray into both nostrils daily as needed for allergies or rhinitis.   folic acid 1 MG tablet Commonly known as: FOLVITE Take 1 tablet (1 mg total) by mouth daily.   furosemide 40 MG tablet Commonly known as: LASIX Take 40 mg by mouth. Once A Day on Sun, Tue, Thu, Sat   furosemide 40 MG tablet Commonly known as: LASIX Take 60 mg by mouth. Once A Day on Mon, Wed, Fri   hydrALAZINE 25 MG tablet Commonly known as: APRESOLINE Take 25 mg by mouth. Take 1 tablet with lunch. Take extra tablet as needed for systolic BP greater than 858 mmHG. Once A Day   hydrALAZINE 25 MG tablet Commonly known as: APRESOLINE Take 25 mg by mouth. Take an extra tab of 25 mg hydralazine as needed for systolic BP greater than 850 mmHg Once A Day - PRN   HYDROcodone-acetaminophen 5-325 MG tablet Commonly known as: NORCO/VICODIN Take 1-2 tablets by mouth every 4 (four) hours as needed for moderate  pain (pain score 4-6).   isosorbide mononitrate 30 MG 24 hr tablet Commonly known as: IMDUR Take 1 tablet (30 mg total) by mouth daily.   LORazepam 1 MG tablet Commonly known as: ATIVAN Take 1 tablet (1 mg total) by mouth every 8 (eight) hours as needed for anxiety.   methocarbamol 500 MG tablet Commonly known as: ROBAXIN Take 1 tablet (500 mg total) by mouth every 6 (six) hours as needed for muscle spasms.   MULTIVITAMIN PO Take 1 tablet by mouth daily.   polyethylene glycol 17 g packet Commonly known as: MIRALAX / GLYCOLAX Take 17 g by mouth daily.   PRESERVISION AREDS 2 PO Take 1 capsule by mouth daily.   thiamine 100 MG tablet Take 1 tablet (100 mg total) by mouth daily.   zinc oxide 20 % ointment Apply 1 application topically as needed for irritation.       Review of Systems  Vitals:   09/11/20 1032  BP: (!) 132/50  Pulse: 78  Resp: 20  Temp: (!) 96 F (35.6 C)  SpO2: 93%  Weight: 159 lb 1.6 oz (72.2 kg)  Height: 6' (1.829 m)   Body mass index is 21.58 kg/m. Physical Exam Constitutional: Oriented to person, place, and time. Well-developed and well-nourished.  HENT:  Head: Normocephalic.  Mouth/Throat: Oropharynx is clear and moist.  Eyes: Pupils are equal, round, and reactive to light.  Neck: Neck supple.  Cardiovascular: Normal rate and normal heart sounds.  No murmur heard. Pulmonary/Chest: Effort normal and breath sounds normal. No respiratory distress. No wheezes. She has no rales.  Abdominal: Soft. Bowel sounds are normal. No distension. There is no tenderness. There is no rebound.  Musculoskeletal: No edema.  Lymphadenopathy: none Neurological: Alert and oriented to person, place, and time.  Skin: Skin is warm and dry.  Psychiatric: Normal mood and affect. Behavior is normal. Thought content normal.   Labs reviewed: Basic Metabolic Panel: Recent Labs  08/31/20 0111 09/01/20 0247 09/02/20 0130 09/08/20 0000  NA 133* 133* 134* 134*   K 3.8 3.7 4.0 4.6  CL 102 103 104 101  CO2 24 23 22  25*  GLUCOSE 104* 88 97  --   BUN 26* 27* 25* 25*  CREATININE 1.00 0.99 1.05 1.1  CALCIUM 7.9* 7.9* 8.0* 8.4*  MG 2.4 2.2 2.1  --    Liver Function Tests: Recent Labs    08/25/20 1832 08/26/20 0332 09/08/20 0000  AST 39 34 50*  ALT 24 21 47*  ALKPHOS 54 42 98  BILITOT 1.1 1.1  --   PROT 5.8* 5.3*  --   ALBUMIN 3.4* 2.9* 3.0*   No results for input(s): LIPASE, AMYLASE in the last 8760 hours. No results for input(s): AMMONIA in the last 8760 hours. CBC: Recent Labs    08/31/20 0111 09/01/20 0247 09/02/20 0130 09/08/20 0000  WBC 4.4 4.0 4.0 4.9  NEUTROABS 3.1  --  2.7 3,729.00  HGB 10.4* 10.0* 9.7* 10.8*  HCT 31.3* 29.1* 28.1* 32*  MCV 93.4 92.4 92.7  --   PLT 122* 144* 164 366   Cardiac Enzymes: No results for input(s): CKTOTAL, CKMB, CKMBINDEX, TROPONINI in the last 8760 hours. BNP: Invalid input(s): POCBNP No results found for: HGBA1C No results found for: TSH Lab Results  Component Value Date   VITAMINB12 478 08/25/2020   Lab Results  Component Value Date   FOLATE 44.0 08/25/2020   Lab Results  Component Value Date   IRON 60 08/25/2020   TIBC 413 08/25/2020    Imaging and Procedures obtained prior to SNF admission: DG Pelvis 1-2 Views  Result Date: 08/25/2020 CLINICAL DATA:  [place], left femoral deformity EXAM: PELVIS - 1-2 VIEW COMPARISON:  10/12/2017 FINDINGS: Single frontal view of the pelvis excludes portions of the proximal right femur and right iliac crest by collimation. Bilateral hip arthroplasties are identified. There are no acute displaced pelvic fractures. Soft tissues are unremarkable. Bowel gas pattern is normal. IMPRESSION: 1. Bilateral hip arthroplasties as above.  No acute pelvic fracture. Electronically Signed   By: Randa Ngo M.D.   On: 08/25/2020 17:30   DG Chest Portable 1 View  Result Date: 08/25/2020 CLINICAL DATA:  Left femur fracture, fell EXAM: PORTABLE CHEST 1 VIEW  COMPARISON:  10/09/2017 FINDINGS: Single frontal view of the chest demonstrates marked enlargement the cardiac silhouette, stable. Postsurgical changes from median sternotomy and valvular replacement. Mild chronic central vascular congestion without airspace disease, effusion, or pneumothorax. There are no acute displaced fractures. IMPRESSION: 1. Stable enlarged cardiac silhouette.  No acute process. Electronically Signed   By: Randa Ngo M.D.   On: 08/25/2020 17:32   DG FEMUR MIN 2 VIEWS LEFT  Result Date: 08/25/2020 CLINICAL DATA:  [place], left femur deformity EXAM: LEFT FEMUR 2 VIEWS COMPARISON:  None. FINDINGS: Frontal and cross-table lateral views of the left femur are obtained. Left hip arthroplasty is identified in the expected position. There is an oblique spiral fracture of the distal left femoral diaphysis, with medial displacement and varus angulation at the fracture site. Proximal migration of the distal fracture fragment by approximately 5 cm results in foreshortening of the left leg. There is diffuse soft tissue swelling. IMPRESSION: 1. Acute spiral fracture of the distal left femoral diaphysis, with valgus angulation as well as medial and proximal displacement of the distal fracture fragment. Electronically Signed   By: Randa Ngo M.D.   On: 08/25/2020 17:32    Assessment/Plan Closed displaced fracture of  distal epiphysis of left femur with routine healing, subsequent encounter Pain Controlled Follow up with Ortho Per therapy making slow progress On Touch Down Weight bearing  S/P MVR (mitral valve replacement) On COumadin Benign prostatic hyperplasia without lower urinary tract symptoms Continue to monitor Says he has not tolerated any meds before Generalized osteoarthritis of multiple sites Tylenol prn Longstanding persistent atrial fibrillation: CHA2DS2-VASc Score 3 Ob Coumadin and Zebeta for rate control Essential hypertension On Hydralazine and Zebeta Also on  Imdur Bilateral Edema /Valvular Cadiomyopathy On Lasix      Family/ staff Communication:   Labs/tests ordered:

## 2020-09-15 DIAGNOSIS — Z7401 Bed confinement status: Secondary | ICD-10-CM | POA: Diagnosis not present

## 2020-09-15 DIAGNOSIS — S72032D Displaced midcervical fracture of left femur, subsequent encounter for closed fracture with routine healing: Secondary | ICD-10-CM | POA: Diagnosis not present

## 2020-09-15 DIAGNOSIS — R531 Weakness: Secondary | ICD-10-CM | POA: Diagnosis not present

## 2020-09-15 DIAGNOSIS — W19XXXA Unspecified fall, initial encounter: Secondary | ICD-10-CM | POA: Diagnosis not present

## 2020-09-15 DIAGNOSIS — R0902 Hypoxemia: Secondary | ICD-10-CM | POA: Diagnosis not present

## 2020-09-15 DIAGNOSIS — M2550 Pain in unspecified joint: Secondary | ICD-10-CM | POA: Diagnosis not present

## 2020-09-15 LAB — BASIC METABOLIC PANEL
BUN: 23 — AB (ref 4–21)
CO2: 23 — AB (ref 13–22)
Chloride: 99 (ref 99–108)
Creatinine: 0.9 (ref 0.6–1.3)
Glucose: 84
Potassium: 4 (ref 3.4–5.3)
Sodium: 131 — AB (ref 137–147)

## 2020-09-15 LAB — HEPATIC FUNCTION PANEL
ALT: 42 — AB (ref 10–40)
AST: 45 — AB (ref 14–40)
Alkaline Phosphatase: 111 (ref 25–125)
Bilirubin, Total: 0.9

## 2020-09-15 LAB — COMPREHENSIVE METABOLIC PANEL
Albumin: 3 — AB (ref 3.5–5.0)
Calcium: 8.1 — AB (ref 8.7–10.7)
Globulin: 2.3

## 2020-09-16 ENCOUNTER — Encounter: Payer: Self-pay | Admitting: Nurse Practitioner

## 2020-09-16 DIAGNOSIS — E871 Hypo-osmolality and hyponatremia: Secondary | ICD-10-CM | POA: Insufficient documentation

## 2020-09-19 NOTE — Telephone Encounter (Signed)
This encounter was created in error - please disregard.

## 2020-09-22 ENCOUNTER — Encounter: Payer: Self-pay | Admitting: Nurse Practitioner

## 2020-09-22 ENCOUNTER — Non-Acute Institutional Stay (SKILLED_NURSING_FACILITY): Payer: Medicare Other | Admitting: Nurse Practitioner

## 2020-09-22 DIAGNOSIS — G4700 Insomnia, unspecified: Secondary | ICD-10-CM

## 2020-09-22 DIAGNOSIS — Z952 Presence of prosthetic heart valve: Secondary | ICD-10-CM | POA: Diagnosis not present

## 2020-09-22 DIAGNOSIS — W19XXXS Unspecified fall, sequela: Secondary | ICD-10-CM

## 2020-09-22 DIAGNOSIS — K5901 Slow transit constipation: Secondary | ICD-10-CM | POA: Diagnosis not present

## 2020-09-22 DIAGNOSIS — R300 Dysuria: Secondary | ICD-10-CM

## 2020-09-22 DIAGNOSIS — M159 Polyosteoarthritis, unspecified: Secondary | ICD-10-CM

## 2020-09-22 DIAGNOSIS — I4811 Longstanding persistent atrial fibrillation: Secondary | ICD-10-CM

## 2020-09-22 DIAGNOSIS — D539 Nutritional anemia, unspecified: Secondary | ICD-10-CM

## 2020-09-22 DIAGNOSIS — S72442D Displaced fracture of lower epiphysis (separation) of left femur, subsequent encounter for closed fracture with routine healing: Secondary | ICD-10-CM | POA: Diagnosis not present

## 2020-09-22 DIAGNOSIS — I10 Essential (primary) hypertension: Secondary | ICD-10-CM | POA: Diagnosis not present

## 2020-09-22 DIAGNOSIS — N400 Enlarged prostate without lower urinary tract symptoms: Secondary | ICD-10-CM | POA: Diagnosis not present

## 2020-09-22 DIAGNOSIS — R634 Abnormal weight loss: Secondary | ICD-10-CM | POA: Diagnosis not present

## 2020-09-22 NOTE — Progress Notes (Unsigned)
Location:    [place] Room Number: 29 Place of Service:  SNF [phone]) Provider:  Marlana Latus NP  [name], MD  Patient Care Team: [name], MD as PCP - General (Internal Medicine)  Extended Emergency Contact Information Primary Emergency Contact: Cha, [place] Mobile Phone: [phone] Relation: Son Secondary Emergency Contact: [facility] Address: [address]          [place]  [place] Home Phone: [phone] Relation: Spouse  Code Status:  Full Code Goals of care: Advanced Directive information Advanced Directives 09/11/2020  Does Patient Have a Medical Advance Directive? Yes  Type of Advance Directive Out of facility DNR (pink MOST or yellow form);Living will  Does patient want to make changes to medical advance directive? No - Patient declined  Copy of [place] in Chart? -  Would patient like information on creating a medical advance directive? -  Pre-existing out of facility DNR order (yellow form or pink MOST form) Pink MOST form placed in chart (order not valid for inpatient use);Yellow form placed in chart (order not valid for inpatient use)     Chief Complaint  Patient presents with  . Acute Visit    Painful urination    HPI:  Pt is a 87 y.o. male seen today for an acute visit for painful urination since 09/20/20, pending UA C/S, Pyridium $RemoveBefor'100mg'POUDwsqmxrBo$  tid x2 days for symptomatic management. Wbc 3.1, neutrophils 66.5  09/23/20. The patient admitted insomnia and decreased appetite, but denied nausea, vomiting, abd pain, constipation, or urinary retention.     Hospitalized 08/25/20-3/822 for closed displaced fracture of distal epiphysis of left femur, ORIF 08/28/20, TDW with walker, f/u Ortho              S/P MVR 1008 goa of INR 2.5-3.5, takes Coumadin             BPH no urinary retention.              OA s/p R+L hip arhtroplasties             PAF takes Coumadin, Bisoprolol.              HTN takes Bisoprolol,  Furosemide, Hydralazine, Isosorbide. Na 131 Bun/creat 23/0.9 09/15/20             Macrocytic anemia, post op, transfused PRBC total 7u, Iron 60, Vit B12 478, Folate 44. Hgb 10.8 09/23/20             [place], needs assistance with transfer.              Constipation, takes Colace, MiraLax.    Past Medical History:  Diagnosis Date  . Anemia    leakoppenia  . BPH (benign prostatic hypertrophy)   . Bullous pemphigoid    [name], March 2011, right forearm squamous cell carcinoma  . Chronic anticoagulation    systemic  . Colon polyp    transverse, 2002  . History of peptic ulcer    remote, 3/95  . Hx of actinic keratosis   . Hx of basal cell carcinoma   . Hx of squamous cell carcinoma of skin   . Hyperlipidemia   . Left inguinal hernia   . Moderate aortic insufficiency 2009   audible aortic insufficiency on 1/09 echo  . PAF (paroxysmal atrial fibrillation) ([place]) 01/17/2014   On Warfarin.  . S/P mitral valve replacement with metallic valve 38/1017   INR goal 2.5-3.5, St Jude,   . 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 Electrodesiccation and Curettage and Shave Biopsy Right    Right medial, anterio tibia: Well differentiated Squamous Cell  . hip replacements Left    10 years ago  . MITRAL VALVE REPLACEMENT  03/1996   St. Jude mechanical valve  . ORIF FEMUR FRACTURE Left 08/28/2020   Procedure: OPEN REDUCTION INTERNAL FIXATION (ORIF) DISTAL FEMUR FRACTURE;  Surgeon: Samson Frederic, MD;  Location: [facility] OR;  Service: Orthopedics;  Laterality: Left;  . TOTAL HIP ARTHROPLASTY Right 10/12/2017   Procedure: RIGHT TOTAL HIP ARTHROPLASTY ANTERIOR APPROACH;  Surgeon: Ollen Gross, MD;  Location: [facility] ORS;  Service: Orthopedics;  Laterality: Right;  . TRANSTHORACIC ECHOCARDIOGRAM  12/2018   Unable to assess diastolic function because of A. fib. Normal RV function, but moderately elevated RVSP.  Severe  biatrial enlargement. S/P St Jude bileaflet mechanical MVR that appears to be functioning normally. Mitral valve regurgitation cannot assess due to mechanical valve shadowing. MV Mean grad: 7.0 mmHg MV Area (PHT): 3.38 cm (stable for valve).  Mild Ao Sclerosis, Mild-Mod AI  . TRANSTHORACIC ECHOCARDIOGRAM  08/'17; 10/'18    a) Mild conc LVH. EF 55-60%. No RWMA. Mod AI. Mechanical MV prosthesis functioning properly. LAD dilation.;; b)  EF 55-60%.  Mo AI.  Bileaflet Saint Jude mechanical MV with no paravalvular leak.  Severe LA dilation.  Minimally elevated PAP    Allergies  Allergen Reactions  . Flexeril [Cyclobenzaprine] Diarrhea    Allergies as of 09/22/2020      Reactions   Flexeril [cyclobenzaprine] Diarrhea      Medication List       Accurate as of September 22, 2020 11:59 PM. If you have any questions, ask your nurse or doctor.        acetaminophen 325 MG tablet Commonly known as: TYLENOL Take 650 mg by mouth in the morning, at noon, and at bedtime.   bisoprolol 5 MG tablet Commonly known as: ZEBETA Take 1 tablet (5 mg total) by mouth daily.   docusate sodium 100 MG capsule Commonly known as: COLACE Take 1 capsule (100 mg total) by mouth 2 (two) times daily. While on narcotics.   fluticasone 50 MCG/ACT nasal spray Commonly known as: FLONASE Place 1 spray into both nostrils daily as needed for allergies or rhinitis.   folic acid 1 MG tablet Commonly known as: FOLVITE Take 1 tablet (1 mg total) by mouth daily.   furosemide 40 MG tablet Commonly known as: LASIX Take 40 mg by mouth. Once A Day on Sun, Tue, Thu, Sat   furosemide 40 MG tablet Commonly known as: LASIX Take 60 mg by mouth. Once A Day on Mon, Wed, Fri   hydrALAZINE 25 MG tablet Commonly known as: APRESOLINE Take 25 mg by mouth. Take 1 tablet with lunch. Take extra tablet as needed for systolic BP greater than 160 mmHG. Once A Day   hydrALAZINE 25 MG tablet Commonly known as: APRESOLINE Take 25 mg by  mouth. Take an extra tab of 25 mg hydralazine as needed for systolic BP greater than 160 mmHg Once A Day - PRN   HYDROcodone-acetaminophen 5-325 MG tablet Commonly known as: NORCO/VICODIN Take 1-2 tablets by mouth every 4 (four) hours as needed for moderate pain (pain score 4-6).   isosorbide mononitrate 30 MG 24 hr tablet Commonly known as: IMDUR Take 1 tablet (30 mg total) by mouth daily.   lactose  free nutrition Liqd Take 237 mLs by mouth every morning.   LORazepam 1 MG tablet Commonly known as: ATIVAN Take 1 tablet (1 mg total) by mouth every 8 (eight) hours as needed for anxiety.   methocarbamol 500 MG tablet Commonly known as: ROBAXIN Take 1 tablet (500 mg total) by mouth every 6 (six) hours as needed for muscle spasms.   MULTIVITAMIN PO Take 1 tablet by mouth daily.   phenazopyridine 100 MG tablet Commonly known as: PYRIDIUM Take 100 mg by mouth in the morning, at noon, and at bedtime.   polyethylene glycol 17 g packet Commonly known as: MIRALAX / GLYCOLAX Take 17 g by mouth daily.   PRESERVISION AREDS 2 PO Take 1 capsule by mouth daily.   thiamine 100 MG tablet Take 1 tablet (100 mg total) by mouth daily.   warfarin 5 MG tablet Commonly known as: COUMADIN Take as directed by the anticoagulation clinic. If you are unsure how to take this medication, talk to your nurse or doctor. Original instructions: Take 4.5 mg by mouth daily.   zinc oxide 20 % ointment Apply 1 application topically as needed for irritation.       Review of Systems  Constitutional: Positive for appetite change, fatigue and unexpected weight change. Negative for fever.       Weight loss 6-10Ibs since admission to SN[facility]  HENT: Positive for hearing loss. Negative for congestion and voice change.   Respiratory: Negative for cough, shortness of breath and wheezing.   Cardiovascular: Negative for leg swelling.  Gastrointestinal: Negative for abdominal pain, constipation, nausea and  vomiting.  Genitourinary: Positive for dysuria. Negative for difficulty urinating and urgency.       Incontinent of urine, uses condom catheter.   Musculoskeletal: Positive for arthralgias, gait problem and joint swelling.  Skin: Positive for wound.  Neurological: Negative for speech difficulty, weakness, light-headedness and headaches.       Memory lapses.   Psychiatric/Behavioral: Positive for sleep disturbance. Negative for behavioral problems. The patient is not nervous/anxious.     Immunization History  Administered Date(s) Administered  . Influenza, High Dose Seasonal PF 04/20/2016  . Influenza,inj,Quad PF,6+ Mos 03/28/2018  . Influenza-Unspecified 03/30/2017  . Pneumococcal Conjugate-13 06/10/2014  . Pneumococcal-Unspecified 02/02/2006  . Tdap 06/10/2014  . Zoster 03/29/2015   Pertinent  Health Maintenance Due  Topic Date Due  . INFLUENZA VACCINE  Completed  . PNA vac Low Risk Adult  Completed   No flowsheet data found. Functional Status Survey:    Vitals:   09/22/20 1047  BP: (!) 111/53  Pulse: 67  Resp: 17  Temp: 97.9 F (36.6 C)  SpO2: 93%  Weight: 149 lb 4.8 oz (67.7 kg)  Height: 6' (1.829 m)   Body mass index is 20.25 kg/m. Physical Exam Vitals and nursing note reviewed.  Constitutional:      General: He is not in acute distress.    Appearance: He is not ill-appearing.     Comments: Appears tired.   HENT:     Head: Normocephalic and atraumatic.     Nose: Nose normal.     Mouth/Throat:     Mouth: Mucous membranes are moist.  Eyes:     Extraocular Movements: Extraocular movements intact.     Conjunctiva/sclera: Conjunctivae normal.     Pupils: Pupils are equal, round, and reactive to light.  Cardiovascular:     Rate and Rhythm: Normal rate. Rhythm irregular.     Heart sounds: Murmur heard.    Pulmonary:  Effort: Pulmonary effort is normal.     Breath sounds: No wheezing or rales.  Abdominal:     General: Bowel sounds are normal. There is  no distension.     Palpations: Abdomen is soft.     Tenderness: There is no abdominal tenderness. There is no right CVA tenderness, left CVA tenderness, guarding or rebound.  Musculoskeletal:        General: Tenderness present.     Cervical back: Normal range of motion and neck supple.     Right lower leg: No edema.     Left lower leg: No edema.     Comments: LLE>RLE  Skin:    General: Skin is warm and dry.     Comments: Left hip surgical wound is covered with dressing during my examination, excessive serous drainage saturated abd pad and bedding. Dark pigmented venous insufficiency skin changes BLE  Neurological:     General: No focal deficit present.     Mental Status: He is alert and oriented to person, place, and time. Mental status is at baseline.     Motor: No weakness.     Coordination: Coordination normal.     Gait: Gait abnormal.  Psychiatric:        Mood and Affect: Mood normal.        Behavior: Behavior normal.        Thought Content: Thought content normal.        Judgment: Judgment normal.     Labs reviewed: Recent Labs    08/31/20 0111 09/01/20 0247 09/02/20 0130 09/08/20 0000 09/15/20 0000  NA 133* 133* 134* 134* 131*  K 3.8 3.7 4.0 4.6 4.0  CL 102 103 104 101 99  CO2 $Re'24 23 22 'ibE$ 25* 23*  GLUCOSE 104* 88 97  --   --   BUN 26* 27* 25* 25* 23*  CREATININE 1.00 0.99 1.05 1.1 0.9  CALCIUM 7.9* 7.9* 8.0* 8.4* 8.1*  MG 2.4 2.2 2.1  --   --    Recent Labs    08/25/20 1832 08/26/20 0332 09/08/20 0000 09/15/20 0000  AST 39 34 50* 45*  ALT 24 21 47* 42*  ALKPHOS 54 42 98 111  BILITOT 1.1 1.1  --   --   PROT 5.8* 5.3*  --   --   ALBUMIN 3.4* 2.9* 3.0* 3.0*   Recent Labs    08/31/20 0111 09/01/20 0247 09/02/20 0130 09/08/20 0000  WBC 4.4 4.0 4.0 4.9  NEUTROABS 3.1  --  2.7 3,729.00  HGB 10.4* 10.0* 9.7* 10.8*  HCT 31.3* 29.1* 28.1* 32*  MCV 93.4 92.4 92.7  --   PLT 122* 144* 164 366   No results found for: TSH No results found for: HGBA1C No  results found for: CHOL, HDL, LDLCALC, LDLDIRECT, TRIG, CHOLHDL  Significant Diagnostic Results in last 30 days:  DG Pelvis 1-2 Views  Result Date: 08/25/2020 CLINICAL DATA:  [place], left femoral deformity EXAM: PELVIS - 1-2 VIEW COMPARISON:  10/12/2017 FINDINGS: Single frontal view of the pelvis excludes portions of the proximal right femur and right iliac crest by collimation. Bilateral hip arthroplasties are identified. There are no acute displaced pelvic fractures. Soft tissues are unremarkable. Bowel gas pattern is normal. IMPRESSION: 1. Bilateral hip arthroplasties as above.  No acute pelvic fracture. Electronically Signed   By: Randa Ngo M.D.   On: 08/25/2020 17:30   DG Chest Portable 1 View  Result Date: 08/25/2020 CLINICAL DATA:  Left femur fracture, fell EXAM: PORTABLE CHEST  1 VIEW COMPARISON:  10/09/2017 FINDINGS: Single frontal view of the chest demonstrates marked enlargement the cardiac silhouette, stable. Postsurgical changes from median sternotomy and valvular replacement. Mild chronic central vascular congestion without airspace disease, effusion, or pneumothorax. There are no acute displaced fractures. IMPRESSION: 1. Stable enlarged cardiac silhouette.  No acute process. Electronically Signed   By: Randa Ngo M.D.   On: 08/25/2020 17:32   DG C-Arm 1-60 Min  Result Date: 08/28/2020 CLINICAL DATA:  Surgery, elective. Additional history provided: Left femur. Provided fluoroscopy time 2 minutes, 10 seconds. EXAM: LEFT FEMUR 2 VIEWS; DG C-ARM 1-60 MIN COMPARISON:  Radiographs of the left femur 08/25/2020. FINDINGS: Nine intraoperative fluoroscopic images of the left femur are submitted. The images demonstrate interval ORIF of an acute left femoral diaphyseal fracture utilizing a lateral plate and screws as well as cerclage wires. Alignment is essentially anatomic. Partially imaged left hip arthroplasty. IMPRESSION: Nine intraoperative fluoroscopic images from ORIF of a left femoral  diaphyseal fracture, as described. Electronically Signed   By: Kellie Simmering DO   On: 08/28/2020 17:33   DG FEMUR MIN 2 VIEWS LEFT  Result Date: 08/28/2020 CLINICAL DATA:  Surgery, elective. Additional history provided: Left femur. Provided fluoroscopy time 2 minutes, 10 seconds. EXAM: LEFT FEMUR 2 VIEWS; DG C-ARM 1-60 MIN COMPARISON:  Radiographs of the left femur 08/25/2020. FINDINGS: Nine intraoperative fluoroscopic images of the left femur are submitted. The images demonstrate interval ORIF of an acute left femoral diaphyseal fracture utilizing a lateral plate and screws as well as cerclage wires. Alignment is essentially anatomic. Partially imaged left hip arthroplasty. IMPRESSION: Nine intraoperative fluoroscopic images from ORIF of a left femoral diaphyseal fracture, as described. Electronically Signed   By: Kellie Simmering DO   On: 08/28/2020 17:33   DG FEMUR MIN 2 VIEWS LEFT  Result Date: 08/25/2020 CLINICAL DATA:  [place], left femur deformity EXAM: LEFT FEMUR 2 VIEWS COMPARISON:  None. FINDINGS: Frontal and cross-table lateral views of the left femur are obtained. Left hip arthroplasty is identified in the expected position. There is an oblique spiral fracture of the distal left femoral diaphysis, with medial displacement and varus angulation at the fracture site. Proximal migration of the distal fracture fragment by approximately 5 cm results in foreshortening of the left leg. There is diffuse soft tissue swelling. IMPRESSION: 1. Acute spiral fracture of the distal left femoral diaphysis, with valgus angulation as well as medial and proximal displacement of the distal fracture fragment. Electronically Signed   By: Randa Ngo M.D.   On: 08/25/2020 17:32   DG FEMUR PORT MIN 2 VIEWS LEFT  Result Date: 08/28/2020 CLINICAL DATA:  Postop fracture EXAM: LEFT FEMUR PORTABLE 2 VIEWS COMPARISON:  08/25/2020 FINDINGS: Previous left hip replacement with normal alignment. Interval surgical plate and multiple  screw fixation as well as cerclage wire fixation of distal femoral fracture, now with anatomic alignment. Gas within the soft tissues consistent with recent surgery. Surgical drain within the thigh. IMPRESSION: Interval surgical plate and screw fixation of distal femoral fracture with anatomic alignment. Electronically Signed   By: Donavan Foil M.D.   On: 08/28/2020 19:20    Assessment/Plan Weight loss Obtain CBC/diff, CMP/eGFR, will try Mirtazapine 7.$RemoveBeforeDEI'5mg'MoOKfZrCEYkLsdbH$  qd  Insomnia Will try Mirtazapine 7.$RemoveBeforeDEI'5mg'KAIUEKHBzaauuuIf$  qhs.   Dysuria Took 2 day course of Pyridium $RemoveBef'100mg'qBZXLumZMH$  tid, pending UA C/S, update CBC/diff, CMP/eGFR  Slow transit constipation Desires to decrease MiraLax qod due to some loose stools. Continue Colace.   Closed displaced fracture of distal epiphysis of left  femur ([place]) Hospitalized 08/25/20-3/822 for closed displaced fracture of distal epiphysis of left femur, ORIF 08/28/20, TDW with walker, f/u Ortho   S/P MVR (mitral valve replacement) S/P MVR 1008 goa of INR 2.5-3.5, takes Coumadin   BPH (benign prostatic hyperplasia) Condom cath  Generalized osteoarthritis of multiple sites s/p R+L hip arhtroplasties   Longstanding persistent atrial fibrillation: CHA2DS2-VASc Score 3  takes Coumadin, Bisoprolol.    Essential hypertension takes Bisoprolol, Furosemide, Hydralazine, Isosorbide. Na 131 Bun/creat 23/0.9 09/15/20   Macrocytic anemia post op, transfused PRBC total 7u, Iron 60, Vit B12 478, Folate 44. Hgb 10.8 09/23/20   [facility] needs assistance with transfer.     Family/ staff Communication: plan of care reviewed with the patient and charge nurse.   Labs/tests ordered: CBC/diff, UA C/S, CMP/eGFR  Time spend 35 minutes.

## 2020-09-23 ENCOUNTER — Encounter: Payer: Self-pay | Admitting: Nurse Practitioner

## 2020-09-23 DIAGNOSIS — R634 Abnormal weight loss: Secondary | ICD-10-CM | POA: Insufficient documentation

## 2020-09-23 DIAGNOSIS — R300 Dysuria: Secondary | ICD-10-CM | POA: Insufficient documentation

## 2020-09-23 DIAGNOSIS — G4700 Insomnia, unspecified: Secondary | ICD-10-CM | POA: Insufficient documentation

## 2020-09-23 LAB — BASIC METABOLIC PANEL
BUN: 33 — AB (ref 4–21)
CO2: 24 — AB (ref 13–22)
Chloride: 99 (ref 99–108)
Creatinine: 1 (ref 0.6–1.3)
Glucose: 97
Potassium: 4.1 (ref 3.4–5.3)
Sodium: 131 — AB (ref 137–147)

## 2020-09-23 LAB — HEPATIC FUNCTION PANEL
ALT: 38 (ref 10–40)
AST: 38 (ref 14–40)
Alkaline Phosphatase: 93 (ref 25–125)
Bilirubin, Total: 1

## 2020-09-23 LAB — COMPREHENSIVE METABOLIC PANEL
Albumin: 3.1 — AB (ref 3.5–5.0)
Calcium: 8.6 — AB (ref 8.7–10.7)
Globulin: 2.5

## 2020-09-23 LAB — CBC AND DIFFERENTIAL
HCT: 33 — AB (ref 41–53)
Hemoglobin: 10.8 — AB (ref 13.5–17.5)
Neutrophils Absolute: 2062
Platelets: 183 (ref 150–399)
WBC: 3.1

## 2020-09-23 LAB — CBC: RBC: 3.43 — AB (ref 3.87–5.11)

## 2020-09-23 NOTE — Assessment & Plan Note (Signed)
takes Coumadin, Bisoprolol.

## 2020-09-23 NOTE — Assessment & Plan Note (Signed)
s/p R+L hip arhtroplasties

## 2020-09-23 NOTE — Assessment & Plan Note (Signed)
post op, transfused PRBC total 7u, Iron 60, Vit B12 478, Folate 44. Hgb 10.8 09/23/20

## 2020-09-23 NOTE — Assessment & Plan Note (Signed)
Will try Mirtazapine 7.5mg  qhs.

## 2020-09-23 NOTE — Assessment & Plan Note (Signed)
S/P MVR 1008 goa of INR 2.5-3.5, takes Coumadin

## 2020-09-23 NOTE — Assessment & Plan Note (Signed)
Condom cath

## 2020-09-23 NOTE — Assessment & Plan Note (Signed)
Took 2 day course of Pyridium $RemoveBef'100mg'RNfgKUOOlW$  tid, pending UA C/S, update CBC/diff, CMP/eGFR

## 2020-09-23 NOTE — Assessment & Plan Note (Signed)
Desires to decrease MiraLax qod due to some loose stools. Continue Colace.

## 2020-09-23 NOTE — Assessment & Plan Note (Signed)
takes Bisoprolol, Furosemide, Hydralazine, Isosorbide. Na 131 Bun/creat 23/0.9 09/15/20

## 2020-09-23 NOTE — Assessment & Plan Note (Signed)
needs assistance with transfer.

## 2020-09-23 NOTE — Assessment & Plan Note (Signed)
Obtain CBC/diff, CMP/eGFR, will try Mirtazapine 7.54m qd

## 2020-09-23 NOTE — Assessment & Plan Note (Signed)
Hospitalized 08/25/20-3/822 for closed displaced fracture of distal epiphysis of left femur, ORIF 08/28/20, TDW with walker, f/u Ortho

## 2020-09-24 ENCOUNTER — Encounter: Payer: Self-pay | Admitting: Internal Medicine

## 2020-09-24 ENCOUNTER — Non-Acute Institutional Stay (SKILLED_NURSING_FACILITY): Payer: Medicare Other | Admitting: Internal Medicine

## 2020-09-24 DIAGNOSIS — G4700 Insomnia, unspecified: Secondary | ICD-10-CM | POA: Diagnosis not present

## 2020-09-24 DIAGNOSIS — N400 Enlarged prostate without lower urinary tract symptoms: Secondary | ICD-10-CM | POA: Diagnosis not present

## 2020-09-24 DIAGNOSIS — S72442D Displaced fracture of lower epiphysis (separation) of left femur, subsequent encounter for closed fracture with routine healing: Secondary | ICD-10-CM

## 2020-09-24 DIAGNOSIS — Z952 Presence of prosthetic heart valve: Secondary | ICD-10-CM

## 2020-09-24 DIAGNOSIS — N3001 Acute cystitis with hematuria: Secondary | ICD-10-CM

## 2020-09-24 DIAGNOSIS — I10 Essential (primary) hypertension: Secondary | ICD-10-CM

## 2020-09-24 DIAGNOSIS — R634 Abnormal weight loss: Secondary | ICD-10-CM | POA: Diagnosis not present

## 2020-09-24 DIAGNOSIS — R197 Diarrhea, unspecified: Secondary | ICD-10-CM

## 2020-09-24 NOTE — Progress Notes (Deleted)
Location:    [place] Room Number: 29 Place of Service:  SNF [phone]) Provider:  Veleta Miners MD  [name], MD  Patient Care Team: [name], MD as PCP - General (Internal Medicine)  Extended Emergency Contact Information Primary Emergency Contact: Wes[place], Gearhart Mobile Phone: [phone] Relation: Son Secondary Emergency Contact: [facility] Address: [address]          [place]  [place] Home Phone: [phone] Relation: Spouse  Code Status:  Full Code Goals of care: Advanced Directive information Advanced Directives 09/11/2020  Does Patient Have a Medical Advance Directive? Yes  Type of Advance Directive Out of facility DNR (pink MOST or yellow form);Living will  Does patient want to make changes to medical advance directive? No - Patient declined  Copy of [place] in Chart? -  Would patient like information on creating a medical advance directive? -  Pre-existing out of facility DNR order (yellow form or pink MOST form) Pink MOST form placed in chart (order not valid for inpatient use);Yellow form placed in chart (order not valid for inpatient use)     Chief Complaint  Patient presents with  . Acute Visit    UTI    HPI:  Pt is a 87 y.o. male seen today for an acute visit for    Past Medical History:  Diagnosis Date  . Anemia    leakoppenia  . BPH (benign prostatic hypertrophy)   . Bullous pemphigoid    [name], March 2011, right forearm squamous cell carcinoma  . Chronic anticoagulation    systemic  . Colon polyp    transverse, 2002  . History of peptic ulcer    remote, 3/95  . Hx of actinic keratosis   . Hx of basal cell carcinoma   . Hx of squamous cell carcinoma of skin   . Hyperlipidemia   . Left inguinal hernia   . Moderate aortic insufficiency 2009   audible aortic insufficiency on 1/09 echo  . PAF (paroxysmal atrial fibrillation) ([place]) 01/17/2014   On Warfarin.  . S/P mitral  valve replacement with metallic valve 38/1017   INR goal 2.5-3.5, St Jude,   . 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 Electrodesiccation and Curettage and Shave Biopsy Right    Right medial, anterio tibia: Well differentiated Squamous Cell  . hip replacements Left    10 years ago  . MITRAL VALVE REPLACEMENT  03/1996   St. Jude mechanical valve  . ORIF FEMUR FRACTURE Left 08/28/2020   Procedure: OPEN REDUCTION INTERNAL FIXATION (ORIF) DISTAL FEMUR FRACTURE;  Surgeon: Rod Can, MD;  Location: [facility];  Service: Orthopedics;  Laterality: Left;  . TOTAL HIP ARTHROPLASTY Right 10/12/2017   Procedure: RIGHT TOTAL HIP ARTHROPLASTY ANTERIOR APPROACH;  Surgeon: Gaynelle Arabian, MD;  Location: [facility];  Service: Orthopedics;  Laterality: Right;  . TRANSTHORACIC ECHOCARDIOGRAM  12/2018   Unable to assess diastolic function because of A. fib. Normal RV function, but moderately elevated RVSP.  Severe biatrial enlargement. S/P St Jude bileaflet mechanical MVR that appears to be functioning normally. Mitral valve regurgitation cannot assess due to mechanical valve shadowing. MV Mean grad: 7.0 mmHg MV Area (PHT): 3.38 cm (stable for valve).  Mild Ao Sclerosis, Mild-Mod AI  . TRANSTHORACIC ECHOCARDIOGRAM  08/'17; 10/'18    a) Mild conc LVH. EF 55-60%. No RWMA. Mod AI. Mechanical MV prosthesis  functioning properly. LAD dilation.;; b)  EF 55-60%.  Mo AI.  Bileaflet Saint Jude mechanical MV with no paravalvular leak.  Severe LA dilation.  Minimally elevated PAP    Allergies  Allergen Reactions  . Flexeril [Cyclobenzaprine] Diarrhea    Allergies as of 09/24/2020      Reactions   Flexeril [cyclobenzaprine] Diarrhea      Medication List       Accurate as of September 24, 2020 11:11 AM. If you have any questions, ask your nurse or doctor.        acetaminophen 325 MG tablet Commonly known as: TYLENOL Take 650 mg  by mouth in the morning, at noon, and at bedtime.   bisoprolol 5 MG tablet Commonly known as: ZEBETA Take 1 tablet (5 mg total) by mouth daily.   docusate sodium 100 MG capsule Commonly known as: COLACE Take 1 capsule (100 mg total) by mouth 2 (two) times daily. While on narcotics.   fluticasone 50 MCG/ACT nasal spray Commonly known as: FLONASE Place 1 spray into both nostrils daily as needed for allergies or rhinitis.   folic acid 1 MG tablet Commonly known as: FOLVITE Take 1 tablet (1 mg total) by mouth daily.   furosemide 40 MG tablet Commonly known as: LASIX Take 40 mg by mouth. Once A Day on Sun, Tue, Thu, Sat   furosemide 40 MG tablet Commonly known as: LASIX Take 60 mg by mouth. Once A Day on Mon, Wed, Fri   hydrALAZINE 25 MG tablet Commonly known as: APRESOLINE Take 25 mg by mouth. Take 1 tablet with lunch. Take extra tablet as needed for systolic BP greater than 290 mmHG. Once A Day   hydrALAZINE 25 MG tablet Commonly known as: APRESOLINE Take 25 mg by mouth. Take an extra tab of 25 mg hydralazine as needed for systolic BP greater than 211 mmHg Once A Day - PRN   HYDROcodone-acetaminophen 5-325 MG tablet Commonly known as: NORCO/VICODIN Take 1-2 tablets by mouth every 4 (four) hours as needed for moderate pain (pain score 4-6).   isosorbide mononitrate 30 MG 24 hr tablet Commonly known as: IMDUR Take 1 tablet (30 mg total) by mouth daily.   lactose free nutrition Liqd Take 237 mLs by mouth every morning.   LORazepam 1 MG tablet Commonly known as: ATIVAN Take 1 tablet (1 mg total) by mouth every 8 (eight) hours as needed for anxiety.   methocarbamol 500 MG tablet Commonly known as: ROBAXIN Take 1 tablet (500 mg total) by mouth every 6 (six) hours as needed for muscle spasms.   mirtazapine 7.5 MG tablet Commonly known as: REMERON Take 7.5 mg by mouth at bedtime.   MULTIVITAMIN PO Take 1 tablet by mouth daily.   polyethylene glycol 17 g  packet Commonly known as: MIRALAX / GLYCOLAX Take 17 g by mouth daily.   PRESERVISION AREDS 2 PO Take 1 capsule by mouth daily.   thiamine 100 MG tablet Take 1 tablet (100 mg total) by mouth daily.   warfarin 5 MG tablet Commonly known as: COUMADIN Take as directed by the anticoagulation clinic. If you are unsure how to take this medication, talk to your nurse or doctor. Original instructions: Take 4.5 mg by mouth daily.   zinc oxide 20 % ointment Apply 1 application topically as needed for irritation.       Review of Systems  Immunization History  Administered Date(s) Administered  . Influenza, High Dose Seasonal PF 04/20/2016  . Influenza,inj,Quad PF,6+ Mos 03/28/2018  .  Influenza-Unspecified 03/30/2017  . Pneumococcal Conjugate-13 06/10/2014  . Pneumococcal-Unspecified 02/02/2006  . Tdap 06/10/2014  . Zoster 03/29/2015   Pertinent  Health Maintenance Due  Topic Date Due  . INFLUENZA VACCINE  Completed  . PNA vac Low Risk Adult  Completed   No flowsheet data found. Functional Status Survey:    Vitals:   09/24/20 1059  BP: (!) 114/56  Pulse: 73  Resp: 19  Temp: (!) 97 F (36.1 C)  SpO2: 98%  Weight: 144 lb 1.6 oz (65.4 kg)  Height: 6' (1.829 m)   Body mass index is 19.54 kg/m. Physical Exam  Labs reviewed: Recent Labs    08/31/20 0111 09/01/20 0247 09/02/20 0130 09/08/20 0000 09/15/20 0000 09/23/20 0000  NA 133* 133* 134* 134* 131* 131*  K 3.8 3.7 4.0 4.6 4.0 4.1  CL 102 103 104 101 99 99  CO2 24 23 22  25* 23* 24*  GLUCOSE 104* 88 97  --   --   --   BUN 26* 27* 25* 25* 23* 33*  CREATININE 1.00 0.99 1.05 1.1 0.9 1.0  CALCIUM 7.9* 7.9* 8.0* 8.4* 8.1* 8.6*  MG 2.4 2.2 2.1  --   --   --    Recent Labs    08/25/20 1832 08/26/20 0332 09/08/20 0000 09/15/20 0000 09/23/20 0000  AST 39 34 50* 45* 38  ALT 24 21 47* 42* 38  ALKPHOS 54 42 98 111 93  BILITOT 1.1 1.1  --   --   --   PROT 5.8* 5.3*  --   --   --   ALBUMIN 3.4* 2.9* 3.0* 3.0*  3.1*   Recent Labs    08/31/20 0111 09/01/20 0247 09/02/20 0130 09/08/20 0000 09/23/20 0000  WBC 4.4 4.0 4.0 4.9 3.1  NEUTROABS 3.1  --  2.7 3,729.00 2,062.00  HGB 10.4* 10.0* 9.7* 10.8* 10.8*  HCT 31.3* 29.1* 28.1* 32* 33*  MCV 93.4 92.4 92.7  --   --   PLT 122* 144* 164 366 183   No results found for: TSH No results found for: HGBA1C No results found for: CHOL, HDL, LDLCALC, LDLDIRECT, TRIG, CHOLHDL  Significant Diagnostic Results in last 30 days:  DG Pelvis 1-2 Views  Result Date: 08/25/2020 CLINICAL DATA:  [place], left femoral deformity EXAM: PELVIS - 1-2 VIEW COMPARISON:  10/12/2017 FINDINGS: Single frontal view of the pelvis excludes portions of the proximal right femur and right iliac crest by collimation. Bilateral hip arthroplasties are identified. There are no acute displaced pelvic fractures. Soft tissues are unremarkable. Bowel gas pattern is normal. IMPRESSION: 1. Bilateral hip arthroplasties as above.  No acute pelvic fracture. Electronically Signed   By: Randa Ngo M.D.   On: 08/25/2020 17:30   DG Chest Portable 1 View  Result Date: 08/25/2020 CLINICAL DATA:  Left femur fracture, fell EXAM: PORTABLE CHEST 1 VIEW COMPARISON:  10/09/2017 FINDINGS: Single frontal view of the chest demonstrates marked enlargement the cardiac silhouette, stable. Postsurgical changes from median sternotomy and valvular replacement. Mild chronic central vascular congestion without airspace disease, effusion, or pneumothorax. There are no acute displaced fractures. IMPRESSION: 1. Stable enlarged cardiac silhouette.  No acute process. Electronically Signed   By: Randa Ngo M.D.   On: 08/25/2020 17:32   DG C-Arm 1-60 Min  Result Date: 08/28/2020 CLINICAL DATA:  Surgery, elective. Additional history provided: Left femur. Provided fluoroscopy time 2 minutes, 10 seconds. EXAM: LEFT FEMUR 2 VIEWS; DG C-ARM 1-60 MIN COMPARISON:  Radiographs of the left femur 08/25/2020.  FINDINGS: Nine  intraoperative fluoroscopic images of the left femur are submitted. The images demonstrate interval ORIF of an acute left femoral diaphyseal fracture utilizing a lateral plate and screws as well as cerclage wires. Alignment is essentially anatomic. Partially imaged left hip arthroplasty. IMPRESSION: Nine intraoperative fluoroscopic images from ORIF of a left femoral diaphyseal fracture, as described. Electronically Signed   By: Kellie Simmering DO   On: 08/28/2020 17:33   DG FEMUR MIN 2 VIEWS LEFT  Result Date: 08/28/2020 CLINICAL DATA:  Surgery, elective. Additional history provided: Left femur. Provided fluoroscopy time 2 minutes, 10 seconds. EXAM: LEFT FEMUR 2 VIEWS; DG C-ARM 1-60 MIN COMPARISON:  Radiographs of the left femur 08/25/2020. FINDINGS: Nine intraoperative fluoroscopic images of the left femur are submitted. The images demonstrate interval ORIF of an acute left femoral diaphyseal fracture utilizing a lateral plate and screws as well as cerclage wires. Alignment is essentially anatomic. Partially imaged left hip arthroplasty. IMPRESSION: Nine intraoperative fluoroscopic images from ORIF of a left femoral diaphyseal fracture, as described. Electronically Signed   By: Kellie Simmering DO   On: 08/28/2020 17:33   DG FEMUR MIN 2 VIEWS LEFT  Result Date: 08/25/2020 CLINICAL DATA:  [place], left femur deformity EXAM: LEFT FEMUR 2 VIEWS COMPARISON:  None. FINDINGS: Frontal and cross-table lateral views of the left femur are obtained. Left hip arthroplasty is identified in the expected position. There is an oblique spiral fracture of the distal left femoral diaphysis, with medial displacement and varus angulation at the fracture site. Proximal migration of the distal fracture fragment by approximately 5 cm results in foreshortening of the left leg. There is diffuse soft tissue swelling. IMPRESSION: 1. Acute spiral fracture of the distal left femoral diaphysis, with valgus angulation as well as medial and proximal  displacement of the distal fracture fragment. Electronically Signed   By: Randa Ngo M.D.   On: 08/25/2020 17:32   DG FEMUR PORT MIN 2 VIEWS LEFT  Result Date: 08/28/2020 CLINICAL DATA:  Postop fracture EXAM: LEFT FEMUR PORTABLE 2 VIEWS COMPARISON:  08/25/2020 FINDINGS: Previous left hip replacement with normal alignment. Interval surgical plate and multiple screw fixation as well as cerclage wire fixation of distal femoral fracture, now with anatomic alignment. Gas within the soft tissues consistent with recent surgery. Surgical drain within the thigh. IMPRESSION: Interval surgical plate and screw fixation of distal femoral fracture with anatomic alignment. Electronically Signed   By: Donavan Foil M.D.   On: 08/28/2020 19:20    Assessment/Plan There are no diagnoses linked to this encounter.   Family/ staff Communication:   Labs/tests ordered:

## 2020-09-24 NOTE — Progress Notes (Signed)
Location: [facility] Room Number: 29 Place of Service:  SNF (31)  Provider:   Code Status:  Goals of Care:  Advanced Directives 09/11/2020  Does Patient Have a Medical Advance Directive? Yes  Type of Advance Directive Out of facility DNR (pink MOST or yellow form);Living will  Does patient want to make changes to medical advance directive? No - Patient declined  Copy of [place] in Chart? -  Would patient like information on creating a medical advance directive? -  Pre-existing out of facility DNR order (yellow form or pink MOST form) Pink MOST form placed in chart (order not valid for inpatient use);Yellow form placed in chart (order not valid for inpatient use)     Chief Complaint  Patient presents with  . Acute Visit    UTI    HPI: Patient is a 87 y.o. male seen today for an acute visit for UTI Low BP and Diarrhea  Admitted in the hospital from 2/28-3/8 Closed displaced fracture of distal epiphysis of left femur  Patient has a history of PAF, mechanical mitral valve is 97 on Coumadin, BPH, previous history of right and left hip arthroplasty, hypertension.  Patient had a mechanical fall when he was with his wife outside.  Came to ED fair he was found to have acute  fracture of distal left Periprosthetic femoral Fracture Underwent ORIF on 3/3  UTI Urine Positive for 100k with Proteus Has symptoms of Dysuria. No fever Weight loss Has lost almost 10-15 lbs since admission Was started on Remeron Low BP Per Nurses his BP has been on Lower side. No Dizziness Diarrhea C/o Still having loose stools Wants his Miralax stopped S/p Hip surgery Doing better with therapy Pain seems Controlled  Past Medical History:  Diagnosis Date  . Anemia    leakoppenia  . BPH (benign prostatic hypertrophy)   . Bullous pemphigoid    [name], March 2011, right forearm squamous cell carcinoma  . Chronic anticoagulation    systemic  . Colon  polyp    transverse, 2002  . History of peptic ulcer    remote, 3/95  . Hx of actinic keratosis   . Hx of basal cell carcinoma   . Hx of squamous cell carcinoma of skin   . Hyperlipidemia   . Left inguinal hernia   . Moderate aortic insufficiency 2009   audible aortic insufficiency on 1/09 echo  . PAF (paroxysmal atrial fibrillation) ([place]) 01/17/2014   On Warfarin.  . S/P mitral valve replacement with metallic valve 95/6213   INR goal 2.5-3.5, St Jude,   . 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 Electrodesiccation and Curettage and Shave Biopsy Right    Right medial, anterio tibia: Well differentiated Squamous Cell  . hip replacements Left    10 years ago  . MITRAL VALVE REPLACEMENT  03/1996   St. Jude mechanical valve  . ORIF FEMUR FRACTURE Left 08/28/2020   Procedure: OPEN REDUCTION INTERNAL FIXATION (ORIF) DISTAL FEMUR FRACTURE;  Surgeon: Rod Can, MD;  Location: [facility];  Service: Orthopedics;  Laterality: Left;  . TOTAL HIP ARTHROPLASTY Right 10/12/2017   Procedure: RIGHT TOTAL HIP ARTHROPLASTY ANTERIOR APPROACH;  Surgeon: Gaynelle Arabian, MD;  Location: [facility];  Service: Orthopedics;  Laterality: Right;  . TRANSTHORACIC ECHOCARDIOGRAM  12/2018   Unable to assess diastolic function because of A. fib. Normal  RV function, but moderately elevated RVSP.  Severe biatrial enlargement. S/P St Jude bileaflet mechanical MVR that appears to be functioning normally. Mitral valve regurgitation cannot assess due to mechanical valve shadowing. MV Mean grad: 7.0 mmHg MV Area (PHT): 3.38 cm (stable for valve).  Mild Ao Sclerosis, Mild-Mod AI  . TRANSTHORACIC ECHOCARDIOGRAM  08/'17; 10/'18    a) Mild conc LVH. EF 55-60%. No RWMA. Mod AI. Mechanical MV prosthesis functioning properly. LAD dilation.;; b)  EF 55-60%.  Mo AI.  Bileaflet Saint Jude mechanical MV with no paravalvular leak.  Severe LA dilation.   Minimally elevated PAP    Allergies  Allergen Reactions  . Flexeril [Cyclobenzaprine] Diarrhea    Outpatient Encounter Medications as of 09/24/2020  Medication Sig  . acetaminophen (TYLENOL) 325 MG tablet Take 650 mg by mouth in the morning, at noon, and at bedtime.  . bisoprolol (ZEBETA) 5 MG tablet Take 1 tablet (5 mg total) by mouth daily.  Marland Kitchen docusate sodium (COLACE) 100 MG capsule Take 1 capsule (100 mg total) by mouth 2 (two) times daily. While on narcotics.  . fluticasone (FLONASE) 50 MCG/ACT nasal spray Place 1 spray into both nostrils daily as needed for allergies or rhinitis.  . folic acid (FOLVITE) 1 MG tablet Take 1 tablet (1 mg total) by mouth daily.  . furosemide (LASIX) 40 MG tablet Take 40 mg by mouth. Once A Day on Sun, Tue, Thu, Sat  . furosemide (LASIX) 40 MG tablet Take 60 mg by mouth. Once A Day on Mon, Wed, Fri  . hydrALAZINE (APRESOLINE) 25 MG tablet Take 25 mg by mouth. Take 1 tablet with lunch. Take extra tablet as needed for systolic BP greater than 588 mmHG. Once A Day  . hydrALAZINE (APRESOLINE) 25 MG tablet Take 25 mg by mouth. Take an extra tab of 25 mg hydralazine as needed for systolic BP greater than 502 mmHg Once A Day - PRN  . HYDROcodone-acetaminophen (NORCO/VICODIN) 5-325 MG tablet Take 1-2 tablets by mouth every 4 (four) hours as needed for moderate pain (pain score 4-6).  . isosorbide mononitrate (IMDUR) 30 MG 24 hr tablet Take 1 tablet (30 mg total) by mouth daily.  Marland Kitchen lactose free nutrition (BOOST) LIQD Take 237 mLs by mouth every morning.  Marland Kitchen LORazepam (ATIVAN) 1 MG tablet Take 1 tablet (1 mg total) by mouth every 8 (eight) hours as needed for anxiety.  . methocarbamol (ROBAXIN) 500 MG tablet Take 1 tablet (500 mg total) by mouth every 6 (six) hours as needed for muscle spasms.  . mirtazapine (REMERON) 7.5 MG tablet Take 7.5 mg by mouth at bedtime.  . Multiple Vitamins-Minerals (MULTIVITAMIN PO) Take 1 tablet by mouth daily.  . Multiple  Vitamins-Minerals (PRESERVISION AREDS 2 PO) Take 1 capsule by mouth daily.  . polyethylene glycol (MIRALAX / GLYCOLAX) 17 g packet Take 17 g by mouth daily.  Marland Kitchen thiamine 100 MG tablet Take 1 tablet (100 mg total) by mouth daily.  Marland Kitchen warfarin (COUMADIN) 5 MG tablet Take 4.5 mg by mouth daily.  Marland Kitchen zinc oxide 20 % ointment Apply 1 application topically as needed for irritation.   No facility-administered encounter medications on file as of 09/24/2020.    Review of Systems:  Review of Systems  Constitutional: Positive for unexpected weight change.  HENT: Negative.   Respiratory: Negative.   Cardiovascular: Positive for leg swelling.  Gastrointestinal: Positive for diarrhea.  Genitourinary: Positive for dysuria, frequency and urgency.  Musculoskeletal: Positive for gait problem.  Skin: Negative.  Neurological: Positive for weakness.  Psychiatric/Behavioral: Negative.     Health Maintenance  Topic Date Due  . COVID-19 Vaccine (1) Never done  . TETANUS/TDAP  06/10/2024  . INFLUENZA VACCINE  Completed  . PNA vac Low Risk Adult  Completed  . HPV VACCINES  Aged Out    Physical Exam: Vitals:   09/24/20 1059  BP: (!) 114/56  Pulse: 73  Resp: 19  Temp: (!) 97 F (36.1 C)  SpO2: 98%  Weight: 144 lb 1.6 oz (65.4 kg)  Height: 6' (1.829 m)   Body mass index is 19.54 kg/m. Physical Exam  Constitutional: Oriented to person, place, and time. Well-developed and well-nourished.  HENT:  Head: Normocephalic.  Mouth/Throat: Oropharynx is clear and moist.  Eyes: Pupils are equal, round, and reactive to light.  Neck: Neck supple.  Cardiovascular: Normal rate and normal heart sounds.  Soft Murmur Heard Pulmonary/Chest: Effort normal and breath sounds normal. No respiratory distress. No wheezes. Has no rales.  Abdominal: Soft. Bowel sounds are normal. No distension. There is no tenderness. There is no rebound.  Musculoskeletal: Has Chronic Venous Changes Bilateral  Lymphadenopathy:  none Neurological: Alert and oriented to person, place, and time.  Skin: Skin is warm and dry.  Psychiatric: Normal mood and affect. Behavior is normal. Thought content normal.    Labs reviewed: Basic Metabolic Panel: Recent Labs    08/31/20 0111 09/01/20 0247 09/02/20 0130 09/08/20 0000 09/15/20 0000 09/23/20 0000  NA 133* 133* 134* 134* 131* 131*  K 3.8 3.7 4.0 4.6 4.0 4.1  CL 102 103 104 101 99 99  CO2 24 23 22  25* 23* 24*  GLUCOSE 104* 88 97  --   --   --   BUN 26* 27* 25* 25* 23* 33*  CREATININE 1.00 0.99 1.05 1.1 0.9 1.0  CALCIUM 7.9* 7.9* 8.0* 8.4* 8.1* 8.6*  MG 2.4 2.2 2.1  --   --   --    Liver Function Tests: Recent Labs    08/25/20 1832 08/26/20 0332 09/08/20 0000 09/15/20 0000 09/23/20 0000  AST 39 34 50* 45* 38  ALT 24 21 47* 42* 38  ALKPHOS 54 42 98 111 93  BILITOT 1.1 1.1  --   --   --   PROT 5.8* 5.3*  --   --   --   ALBUMIN 3.4* 2.9* 3.0* 3.0* 3.1*   No results for input(s): LIPASE, AMYLASE in the last 8760 hours. No results for input(s): AMMONIA in the last 8760 hours. CBC: Recent Labs    08/31/20 0111 09/01/20 0247 09/02/20 0130 09/08/20 0000 09/23/20 0000  WBC 4.4 4.0 4.0 4.9 3.1  NEUTROABS 3.1  --  2.7 3,729.00 2,062.00  HGB 10.4* 10.0* 9.7* 10.8* 10.8*  HCT 31.3* 29.1* 28.1* 32* 33*  MCV 93.4 92.4 92.7  --   --   PLT 122* 144* 164 366 183   Lipid Panel: No results for input(s): CHOL, HDL, LDLCALC, TRIG, CHOLHDL, LDLDIRECT in the last 8760 hours. No results found for: HGBA1C  Procedures since last visit: DG Pelvis 1-2 Views  Result Date: 08/25/2020 CLINICAL DATA:  [place], left femoral deformity EXAM: PELVIS - 1-2 VIEW COMPARISON:  10/12/2017 FINDINGS: Single frontal view of the pelvis excludes portions of the proximal right femur and right iliac crest by collimation. Bilateral hip arthroplasties are identified. There are no acute displaced pelvic fractures. Soft tissues are unremarkable. Bowel gas pattern is normal. IMPRESSION: 1.  Bilateral hip arthroplasties as above.  No acute pelvic fracture.  Electronically Signed   By: Randa Ngo M.D.   On: 08/25/2020 17:30   DG Chest Portable 1 View  Result Date: 08/25/2020 CLINICAL DATA:  Left femur fracture, fell EXAM: PORTABLE CHEST 1 VIEW COMPARISON:  10/09/2017 FINDINGS: Single frontal view of the chest demonstrates marked enlargement the cardiac silhouette, stable. Postsurgical changes from median sternotomy and valvular replacement. Mild chronic central vascular congestion without airspace disease, effusion, or pneumothorax. There are no acute displaced fractures. IMPRESSION: 1. Stable enlarged cardiac silhouette.  No acute process. Electronically Signed   By: Randa Ngo M.D.   On: 08/25/2020 17:32   DG C-Arm 1-60 Min  Result Date: 08/28/2020 CLINICAL DATA:  Surgery, elective. Additional history provided: Left femur. Provided fluoroscopy time 2 minutes, 10 seconds. EXAM: LEFT FEMUR 2 VIEWS; DG C-ARM 1-60 MIN COMPARISON:  Radiographs of the left femur 08/25/2020. FINDINGS: Nine intraoperative fluoroscopic images of the left femur are submitted. The images demonstrate interval ORIF of an acute left femoral diaphyseal fracture utilizing a lateral plate and screws as well as cerclage wires. Alignment is essentially anatomic. Partially imaged left hip arthroplasty. IMPRESSION: Nine intraoperative fluoroscopic images from ORIF of a left femoral diaphyseal fracture, as described. Electronically Signed   By: Kellie Simmering DO   On: 08/28/2020 17:33   DG FEMUR MIN 2 VIEWS LEFT  Result Date: 08/28/2020 CLINICAL DATA:  Surgery, elective. Additional history provided: Left femur. Provided fluoroscopy time 2 minutes, 10 seconds. EXAM: LEFT FEMUR 2 VIEWS; DG C-ARM 1-60 MIN COMPARISON:  Radiographs of the left femur 08/25/2020. FINDINGS: Nine intraoperative fluoroscopic images of the left femur are submitted. The images demonstrate interval ORIF of an acute left femoral diaphyseal fracture  utilizing a lateral plate and screws as well as cerclage wires. Alignment is essentially anatomic. Partially imaged left hip arthroplasty. IMPRESSION: Nine intraoperative fluoroscopic images from ORIF of a left femoral diaphyseal fracture, as described. Electronically Signed   By: Kellie Simmering DO   On: 08/28/2020 17:33   DG FEMUR MIN 2 VIEWS LEFT  Result Date: 08/25/2020 CLINICAL DATA:  [place], left femur deformity EXAM: LEFT FEMUR 2 VIEWS COMPARISON:  None. FINDINGS: Frontal and cross-table lateral views of the left femur are obtained. Left hip arthroplasty is identified in the expected position. There is an oblique spiral fracture of the distal left femoral diaphysis, with medial displacement and varus angulation at the fracture site. Proximal migration of the distal fracture fragment by approximately 5 cm results in foreshortening of the left leg. There is diffuse soft tissue swelling. IMPRESSION: 1. Acute spiral fracture of the distal left femoral diaphysis, with valgus angulation as well as medial and proximal displacement of the distal fracture fragment. Electronically Signed   By: Randa Ngo M.D.   On: 08/25/2020 17:32   DG FEMUR PORT MIN 2 VIEWS LEFT  Result Date: 08/28/2020 CLINICAL DATA:  Postop fracture EXAM: LEFT FEMUR PORTABLE 2 VIEWS COMPARISON:  08/25/2020 FINDINGS: Previous left hip replacement with normal alignment. Interval surgical plate and multiple screw fixation as well as cerclage wire fixation of distal femoral fracture, now with anatomic alignment. Gas within the soft tissues consistent with recent surgery. Surgical drain within the thigh. IMPRESSION: Interval surgical plate and screw fixation of distal femoral fracture with anatomic alignment. Electronically Signed   By: Donavan Foil M.D.   On: 08/28/2020 19:20    Assessment/Plan 1. Acute cystitis with hematuria Urine Positive for Proteus Start on Keflex 250 mg Qid for 7 days Would need Urology Follow up if has recurence  of  his Symptoms  2. Diarrhea, unspecified type Discontinue Miralax If it persists will consider Stool studies  3. Essential hypertension BP on Lower Side Discontinue Hydralazine Continue PRN hydralazine  4. Benign prostatic hyperplasia without lower urinary tract symptoms Does not want anything new right now but if Symptoms don't get [place] will consider Flomax  5. Periprosthetic Left Femur Fracture Pain Controlled Working with therapy Has follow up with Ortho  6. S/P MVR (mitral valve replacement) INR 2.7 Repeat INR  7. Insomnia, unspecified type Doing better with Remeron  8. Weight loss Just started Remeron    Labs/tests ordered:  * No order type specified * Next appt:  Visit date not found Total time spent in this patient care encounter was 45 _  minutes; greater than 50% of the visit spent counseling patient and staff, reviewing records , Labs and coordinating care for problems addressed at this encounter.

## 2020-09-26 ENCOUNTER — Encounter: Payer: Self-pay | Admitting: Nurse Practitioner

## 2020-09-26 ENCOUNTER — Non-Acute Institutional Stay (SKILLED_NURSING_FACILITY): Payer: Medicare Other | Admitting: Nurse Practitioner

## 2020-09-26 DIAGNOSIS — D539 Nutritional anemia, unspecified: Secondary | ICD-10-CM | POA: Diagnosis not present

## 2020-09-26 DIAGNOSIS — M159 Polyosteoarthritis, unspecified: Secondary | ICD-10-CM

## 2020-09-26 DIAGNOSIS — I10 Essential (primary) hypertension: Secondary | ICD-10-CM

## 2020-09-26 DIAGNOSIS — W19XXXS Unspecified fall, sequela: Secondary | ICD-10-CM

## 2020-09-26 DIAGNOSIS — K5901 Slow transit constipation: Secondary | ICD-10-CM | POA: Diagnosis not present

## 2020-09-26 DIAGNOSIS — N390 Urinary tract infection, site not specified: Secondary | ICD-10-CM | POA: Diagnosis not present

## 2020-09-26 DIAGNOSIS — Z952 Presence of prosthetic heart valve: Secondary | ICD-10-CM | POA: Diagnosis not present

## 2020-09-26 DIAGNOSIS — G4700 Insomnia, unspecified: Secondary | ICD-10-CM | POA: Diagnosis not present

## 2020-09-26 DIAGNOSIS — I4811 Longstanding persistent atrial fibrillation: Secondary | ICD-10-CM | POA: Diagnosis not present

## 2020-09-26 DIAGNOSIS — N400 Enlarged prostate without lower urinary tract symptoms: Secondary | ICD-10-CM | POA: Diagnosis not present

## 2020-09-26 NOTE — Assessment & Plan Note (Signed)
Improving, continue Mirtazapine.

## 2020-09-26 NOTE — Assessment & Plan Note (Signed)
goa of INR 2.5-3.5, takes Coumadin

## 2020-09-26 NOTE — Assessment & Plan Note (Signed)
s/p R+L hip arhtroplasties

## 2020-09-26 NOTE — Progress Notes (Signed)
Location:    [facility] Room Number: 29 Place of Service:  SNF (31) Provider: Marlana Latus NP  [name], MD  Patient Care Team: [name], MD as PCP - General (Internal Medicine)  Extended Emergency Contact Information Primary Emergency Contact: Hamish, [place] Mobile Phone: [phone] Relation: Son Secondary Emergency Contact: [facility] Address: [address]          [place]  [place] Home Phone: [phone] Relation: Spouse  Code Status:  DNR Goals of care: Advanced Directive information Advanced Directives 09/26/2020  Does Patient Have a Medical Advance Directive? Yes  Type of Advance Directive Out of facility DNR (pink MOST or yellow form);Living will  Does patient want to make changes to medical advance directive? -  Copy of [place] in Chart? -  Would patient like information on creating a medical advance directive? -  Pre-existing out of facility DNR order (yellow form or pink MOST form) Pink MOST form placed in chart (order not valid for inpatient use);Yellow form placed in chart (order not valid for inpatient use)     Chief Complaint  Patient presents with  . Medical Management of Chronic Issues    HPI:  Pt is a 87 y.o. male seen today for medical management of chronic diseases.      S/p closed displaced fracture of distal epiphysis of left femur, ORIF 08/28/20, TDW with walker, f/u Ortho   UTI, 7 day course of Keflex 250mg  qid started 09/24/20, improved urinary tract symptoms as well as in general.  S/P MVR  goa of INR 2.5-3.5, takes Coumadin BPH no urinary retention.  OA s/p R+L hip arhtroplasties PAF takes Coumadin, Bisoprolol.  HTN takes Bisoprolol, Furosemide, Isosorbide. Na 131, Bun/creat 33/1.0 09/23/20 Macrocytic anemia, post op, transfused PRBC total 7u, Iron 60, Vit B12 478, Folate 44. Hgb 10.8  09/23/20 Fall, needs assistance with transfer.  Constipation, takes Colace.   Insomnia, sleeps and eats better, takes Mirtazapine.    Past Medical History:  Diagnosis Date  . Anemia    leakoppenia  . BPH (benign prostatic hypertrophy)   . Bullous pemphigoid    [name], March 2011, right forearm squamous cell carcinoma  . Chronic anticoagulation    systemic  . Colon polyp    transverse, 2002  . History of peptic ulcer    remote, 3/95  . Hx of actinic keratosis   . Hx of basal cell carcinoma   . Hx of squamous cell carcinoma of skin   . Hyperlipidemia   . Left inguinal hernia   . Moderate aortic insufficiency 2009   audible aortic insufficiency on 1/09 echo  . PAF (paroxysmal atrial fibrillation) ([place]) 01/17/2014   On Warfarin.  . S/P mitral valve replacement with metallic valve 97/4163   INR goal 2.5-3.5, St Jude,   . 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 Electrodesiccation and Curettage and Shave Biopsy Right    Right medial, anterio tibia: Well differentiated Squamous Cell  . hip replacements Left    10 years ago  . MITRAL VALVE REPLACEMENT  03/1996   St. Jude mechanical valve  . ORIF FEMUR FRACTURE Left 08/28/2020   Procedure: OPEN REDUCTION INTERNAL FIXATION (ORIF) DISTAL FEMUR FRACTURE;  Surgeon: Rod Can, MD;  Location: [facility];  Service: Orthopedics;  Laterality: Left;  . TOTAL HIP ARTHROPLASTY Right 10/12/2017   Procedure: RIGHT TOTAL HIP ARTHROPLASTY  ANTERIOR APPROACH;  Surgeon: Gaynelle Arabian, MD;  Location: [facility];  Service: Orthopedics;  Laterality: Right;  . TRANSTHORACIC ECHOCARDIOGRAM  12/2018   Unable to assess diastolic function because of A. fib. Normal RV function, but moderately elevated RVSP.  Severe biatrial enlargement. S/P St Jude bileaflet mechanical MVR that appears to be functioning normally. Mitral valve regurgitation cannot  assess due to mechanical valve shadowing. MV Mean grad: 7.0 mmHg MV Area (PHT): 3.38 cm (stable for valve).  Mild Ao Sclerosis, Mild-Mod AI  . TRANSTHORACIC ECHOCARDIOGRAM  08/'17; 10/'18    a) Mild conc LVH. EF 55-60%. No RWMA. Mod AI. Mechanical MV prosthesis functioning properly. LAD dilation.;; b)  EF 55-60%.  Mo AI.  Bileaflet Saint Jude mechanical MV with no paravalvular leak.  Severe LA dilation.  Minimally elevated PAP    Allergies  Allergen Reactions  . Flexeril [Cyclobenzaprine] Diarrhea    Allergies as of 09/26/2020      Reactions   Flexeril [cyclobenzaprine] Diarrhea      Medication List       Accurate as of September 26, 2020 11:59 PM. If you have any questions, ask your nurse or doctor.        STOP taking these medications   hydrALAZINE 25 MG tablet Commonly known as: APRESOLINE Stopped by: Ricke Kimoto X Patsie Mccardle, NP     TAKE these medications   acetaminophen 325 MG tablet Commonly known as: TYLENOL Take 650 mg by mouth in the morning, at noon, and at bedtime.   bisoprolol 5 MG tablet Commonly known as: ZEBETA Take 1 tablet (5 mg total) by mouth daily.   cephALEXin 250 MG capsule Commonly known as: KEFLEX Take by mouth 4 (four) times daily.   docusate sodium 100 MG capsule Commonly known as: COLACE Take 1 capsule (100 mg total) by mouth 2 (two) times daily. While on narcotics.   fluticasone 50 MCG/ACT nasal spray Commonly known as: FLONASE Place 1 spray into both nostrils daily as needed for allergies or rhinitis.   folic acid 1 MG tablet Commonly known as: FOLVITE Take 1 tablet (1 mg total) by mouth daily.   furosemide 40 MG tablet Commonly known as: LASIX Take 40 mg by mouth. Once A Day on Sun, Tue, Thu, Sat   furosemide 40 MG tablet Commonly known as: LASIX Take 60 mg by mouth. Once A Day on Mon, Wed, Fri   HYDROcodone-acetaminophen 5-325 MG tablet Commonly known as: NORCO/VICODIN Take 1-2 tablets by mouth every 4 (four) hours as needed for moderate pain  (pain score 4-6).   isosorbide mononitrate 30 MG 24 hr tablet Commonly known as: IMDUR Take 1 tablet (30 mg total) by mouth daily.   lactose free nutrition Liqd Take 237 mLs by mouth every morning.   LORazepam 1 MG tablet Commonly known as: ATIVAN Take 1 tablet (1 mg total) by mouth every 8 (eight) hours as needed for anxiety.   methocarbamol 500 MG tablet Commonly known as: ROBAXIN Take 1 tablet (500 mg total) by mouth every 6 (six) hours as needed for muscle spasms.   mirtazapine 7.5 MG tablet Commonly known as: REMERON Take 7.5 mg by mouth at bedtime.   MULTIVITAMIN PO Take 1 tablet by mouth daily.   polyethylene glycol 17 g packet Commonly known as: MIRALAX / GLYCOLAX Take 17 g by mouth daily as needed.   PRESERVISION AREDS 2 PO Take 1 capsule by mouth daily.   saccharomyces boulardii 250 MG capsule Commonly known as: FLORASTOR Take 250 mg by mouth  2 (two) times daily.   thiamine 100 MG tablet Take 1 tablet (100 mg total) by mouth daily.   warfarin 5 MG tablet Commonly known as: COUMADIN Take as directed by the anticoagulation clinic. If you are unsure how to take this medication, talk to your nurse or doctor. Original instructions: Take 4.5 mg by mouth daily.   zinc oxide 20 % ointment Apply 1 application topically as needed for irritation.       Review of Systems  Constitutional: Negative for appetite change, fatigue and fever.  HENT: Positive for hearing loss. Negative for congestion and voice change.   Respiratory: Negative for cough and shortness of breath.   Cardiovascular: Negative for leg swelling.  Gastrointestinal: Negative for abdominal pain and constipation.  Genitourinary: Negative for difficulty urinating, dysuria and urgency.       Incontinent of urine, uses condom catheter.   Musculoskeletal: Positive for arthralgias, gait problem and joint swelling.  Skin: Positive for wound.  Neurological: Negative for dizziness, speech difficulty and  weakness.       Memory lapses.   Psychiatric/Behavioral: Negative for behavioral problems and sleep disturbance. The patient is not nervous/anxious.     Immunization History  Administered Date(s) Administered  . Influenza, High Dose Seasonal PF 04/20/2016  . Influenza,inj,Quad PF,6+ Mos 03/28/2018  . Influenza-Unspecified 03/30/2017  . Moderna Sars-Covid-2 Vaccination 07/02/2019, 07/30/2019, 05/12/2020  . Pneumococcal Conjugate-13 06/10/2014  . Pneumococcal-Unspecified 02/02/2006  . Tdap 06/10/2014  . Zoster 03/29/2015   Pertinent  Health Maintenance Due  Topic Date Due  . INFLUENZA VACCINE  01/26/2021  . PNA vac Low Risk Adult  Completed   No flowsheet data found. Functional Status Survey:    Vitals:   09/26/20 1618  BP: (!) 114/56  Pulse: 73  Resp: 19  Temp: (!) 97.2 F (36.2 C)  SpO2: 94%  Weight: 144 lb 1.6 oz (65.4 kg)  Height: 6' (1.829 m)   Body mass index is 19.54 kg/m. Physical Exam Vitals and nursing note reviewed.  Constitutional:      Appearance: Normal appearance.  HENT:     Head: Normocephalic and atraumatic.     Mouth/Throat:     Mouth: Mucous membranes are moist.  Eyes:     Extraocular Movements: Extraocular movements intact.     Conjunctiva/sclera: Conjunctivae normal.     Pupils: Pupils are equal, round, and reactive to light.  Cardiovascular:     Rate and Rhythm: Normal rate. Rhythm irregular.     Heart sounds: Murmur heard.    Pulmonary:     Effort: Pulmonary effort is normal.     Breath sounds: No rales.  Abdominal:     General: Bowel sounds are normal.     Palpations: Abdomen is soft.     Tenderness: There is no abdominal tenderness. There is no left CVA tenderness or rebound.  Musculoskeletal:        General: Tenderness present.     Cervical back: Normal range of motion and neck supple.     Right lower leg: No edema.     Left lower leg: No edema.     Comments: LLE>RLE  Skin:    General: Skin is warm and dry.     Comments:  Left hip surgical wound is covered with dressing. Dark pigmented venous insufficiency skin changes BLE  Neurological:     General: No focal deficit present.     Mental Status: He is alert and oriented to person, place, and time. Mental status is at baseline.  Motor: No weakness.     Coordination: Coordination normal.     Gait: Gait abnormal.  Psychiatric:        Mood and Affect: Mood normal.        Behavior: Behavior normal.        Thought Content: Thought content normal.        Judgment: Judgment normal.     Labs reviewed: Recent Labs    08/31/20 0111 09/01/20 0247 09/02/20 0130 09/08/20 0000 09/15/20 0000 09/23/20 0000  NA 133* 133* 134* 134* 131* 131*  K 3.8 3.7 4.0 4.6 4.0 4.1  CL 102 103 104 101 99 99  CO2 24 23 22  25* 23* 24*  GLUCOSE 104* 88 97  --   --   --   BUN 26* 27* 25* 25* 23* 33*  CREATININE 1.00 0.99 1.05 1.1 0.9 1.0  CALCIUM 7.9* 7.9* 8.0* 8.4* 8.1* 8.6*  MG 2.4 2.2 2.1  --   --   --    Recent Labs    08/25/20 1832 08/26/20 0332 09/08/20 0000 09/15/20 0000 09/23/20 0000  AST 39 34 50* 45* 38  ALT 24 21 47* 42* 38  ALKPHOS 54 42 98 111 93  BILITOT 1.1 1.1  --   --   --   PROT 5.8* 5.3*  --   --   --   ALBUMIN 3.4* 2.9* 3.0* 3.0* 3.1*   Recent Labs    08/31/20 0111 09/01/20 0247 09/02/20 0130 09/08/20 0000 09/23/20 0000  WBC 4.4 4.0 4.0 4.9 3.1  NEUTROABS 3.1  --  2.7 3,729.00 2,062.00  HGB 10.4* 10.0* 9.7* 10.8* 10.8*  HCT 31.3* 29.1* 28.1* 32* 33*  MCV 93.4 92.4 92.7  --   --   PLT 122* 144* 164 366 183   No results found for: TSH No results found for: HGBA1C No results found for: CHOL, HDL, LDLCALC, LDLDIRECT, TRIG, CHOLHDL  Significant Diagnostic Results in last 30 days:  No results found.  Assessment/Plan Slow transit constipation Stable, takes Colace.   Insomnia Improving, continue Mirtazapine.   Fall Needs assistance for transfer for now.   Macrocytic anemia post op, transfused PRBC total 7u, Iron 60, Vit B12  478, Folate 44. Hgb 10.8 09/23/20   Essential hypertension takes Bisoprolol, Furosemide, Isosorbide. Na 131, Bun/creat 33/1.0 09/23/20  Longstanding persistent atrial fibrillation: CHA2DS2-VASc Score 3 takes Coumadin, Bisoprolol.    Generalized osteoarthritis of multiple sites s/p R+L hip arhtroplasties   BPH (benign prostatic hyperplasia) No urinary retention presently.   S/P MVR (mitral valve replacement)  goa of INR 2.5-3.5, takes Coumadin   UTI (urinary tract infection) 7 day course of Keflex 250mg  qid started 09/24/20, improved urinary tract symptoms as well as in general.     Family/ staff Communication: plan of care reviewed with the patient and charge nurse.   Labs/tests ordered: none  Time spend 35 minutes.

## 2020-09-26 NOTE — Assessment & Plan Note (Signed)
post op, transfused PRBC total 7u, Iron 60, Vit B12 478, Folate 44. Hgb 10.8 09/23/20

## 2020-09-26 NOTE — Assessment & Plan Note (Signed)
takes Coumadin, Bisoprolol.

## 2020-09-26 NOTE — Assessment & Plan Note (Signed)
7 day course of Keflex 250mg  qid started 09/24/20, improved urinary tract symptoms as well as in general.

## 2020-09-26 NOTE — Assessment & Plan Note (Signed)
takes Bisoprolol, Furosemide, Isosorbide. Na 131, Bun/creat 33/1.0 09/23/20

## 2020-09-26 NOTE — Assessment & Plan Note (Signed)
No urinary retention presently.

## 2020-09-26 NOTE — Assessment & Plan Note (Signed)
Needs assistance for transfer for now.

## 2020-09-26 NOTE — Assessment & Plan Note (Signed)
Stable, takes Colace.  

## 2020-09-29 ENCOUNTER — Encounter: Payer: Self-pay | Admitting: Nurse Practitioner

## 2020-10-07 ENCOUNTER — Non-Acute Institutional Stay (SKILLED_NURSING_FACILITY): Payer: Medicare Other | Admitting: Nurse Practitioner

## 2020-10-07 ENCOUNTER — Encounter: Payer: Self-pay | Admitting: Nurse Practitioner

## 2020-10-07 DIAGNOSIS — D539 Nutritional anemia, unspecified: Secondary | ICD-10-CM | POA: Diagnosis not present

## 2020-10-07 DIAGNOSIS — G4700 Insomnia, unspecified: Secondary | ICD-10-CM

## 2020-10-07 DIAGNOSIS — I4811 Longstanding persistent atrial fibrillation: Secondary | ICD-10-CM | POA: Diagnosis not present

## 2020-10-07 DIAGNOSIS — I10 Essential (primary) hypertension: Secondary | ICD-10-CM | POA: Diagnosis not present

## 2020-10-07 DIAGNOSIS — L89312 Pressure ulcer of right buttock, stage 2: Secondary | ICD-10-CM | POA: Diagnosis not present

## 2020-10-07 DIAGNOSIS — S72442D Displaced fracture of lower epiphysis (separation) of left femur, subsequent encounter for closed fracture with routine healing: Secondary | ICD-10-CM | POA: Diagnosis not present

## 2020-10-07 DIAGNOSIS — K5901 Slow transit constipation: Secondary | ICD-10-CM | POA: Diagnosis not present

## 2020-10-07 DIAGNOSIS — N400 Enlarged prostate without lower urinary tract symptoms: Secondary | ICD-10-CM

## 2020-10-07 DIAGNOSIS — W19XXXS Unspecified fall, sequela: Secondary | ICD-10-CM

## 2020-10-07 DIAGNOSIS — M159 Polyosteoarthritis, unspecified: Secondary | ICD-10-CM

## 2020-10-07 DIAGNOSIS — Z952 Presence of prosthetic heart valve: Secondary | ICD-10-CM

## 2020-10-07 NOTE — Assessment & Plan Note (Addendum)
Macrocytic anemia, post op, transfused PRBC total 7u, Iron 60, Vit B12 478, Folate 44. Hgb10.8 09/23/20

## 2020-10-07 NOTE — Assessment & Plan Note (Signed)
BPH no urinary retention.

## 2020-10-07 NOTE — Assessment & Plan Note (Signed)
S/P MVR  goa of INR 2.5-3.5, takes Coumadin

## 2020-10-07 NOTE — Assessment & Plan Note (Signed)
Blood pressure is in control, takes Bisoprolol, Furosemide, Isosorbide.Na 131, Bun/creat 33/1.0 09/23/20

## 2020-10-07 NOTE — Assessment & Plan Note (Signed)
Insomnia, sleeps and eats better, takes Mirtazapine, Lorazepam.

## 2020-10-07 NOTE — Assessment & Plan Note (Signed)
Heart rate is in control, takes Coumadin, Bisoprolol.

## 2020-10-07 NOTE — Assessment & Plan Note (Signed)
Pressure reduction by assisting the patient repositioning frequently. Barrier oint and foam dressing for now.

## 2020-10-07 NOTE — Assessment & Plan Note (Addendum)
Fall, needs assistance with transfer.

## 2020-10-07 NOTE — Assessment & Plan Note (Signed)
S/p closed displaced fracture of distal epiphysis of left femur, ORIF 08/28/20, TDW with walker, f/u Ortho

## 2020-10-07 NOTE — Progress Notes (Signed)
Location:   SNF [place] Room Number: 34 Place of Service:  SNF (31) Provider: Lennie Odor Orvil Faraone NP  [name], MD  Patient Care Team: [name], MD as PCP - General (Internal Medicine)  Extended Emergency Contact Information Primary Emergency Contact: Jaymin, Waln Mobile Phone: [phone] Relation: Son Secondary Emergency Contact: [facility] Address: [address]          [place]  [place] Home Phone: [phone] Relation: Spouse  Code Status:  DNR Goals of care: Advanced Directive information Advanced Directives 09/26/2020  Does Patient Have a Medical Advance Directive? Yes  Type of Advance Directive Out of facility DNR (pink MOST or yellow form);Living will  Does patient want to make changes to medical advance directive? -  Copy of [place] in Chart? -  Would patient like information on creating a medical advance directive? -  Pre-existing out of facility DNR order (yellow form or pink MOST form) Pink MOST form placed in chart (order not valid for inpatient use);Yellow form placed in chart (order not valid for inpatient use)     Chief Complaint  Patient presents with  . Acute Visit    Pressure ulcer R buttock.    HPI:  Pt is a 87 y.o. male seen today for an acute visit for pressure ulcer R buttock, superficial skin missing, no redness or drainage in the area.   S/p closed displaced fracture of distal epiphysis of left femur, ORIF 08/28/20, TDW with walker, f/u Ortho S/P MVR  [place] of INR 2.5-3.5, takes Coumadin BPH no urinary retention.  OA s/p R+L hip arhtroplasties PAF takes Coumadin, Bisoprolol.  HTN takes Bisoprolol, Furosemide, Isosorbide.Na 131, Bun/creat 33/1.0 09/23/20 Macrocytic anemia, post op, transfused PRBC total 7u, Iron 60, Vit B12 478, Folate 44. Hgb10.8 09/23/20 Fall, needs assistance with transfer.   Constipation, takes Colace             Insomnia, sleeps and eats better, takes Mirtazapine, Lorazepam.    Past Medical History:  Diagnosis Date  . Anemia    leakoppenia  . BPH (benign prostatic hypertrophy)   . Bullous pemphigoid    [name], March 2011, right forearm squamous cell carcinoma  . Chronic anticoagulation    systemic  . Colon polyp    transverse, 2002  . History of peptic ulcer    remote, 3/95  . Hx of actinic keratosis   . Hx of basal cell carcinoma   . Hx of squamous cell carcinoma of skin   . Hyperlipidemia   . Left inguinal hernia   . Moderate aortic insufficiency 2009   audible aortic insufficiency on 1/09 echo  . PAF (paroxysmal atrial fibrillation) ([place]) 01/17/2014   On Warfarin.  . S/P mitral valve replacement with metallic valve [id number]/8502   INR goal 2.5-3.5, St Jude,   . 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 Electrodesiccation and Curettage and Shave Biopsy Right    Right medial, anterio tibia: Well differentiated Squamous Cell  . hip replacements Left    10 years ago  . MITRAL VALVE REPLACEMENT  03/1996   St. Jude mechanical valve  . ORIF FEMUR FRACTURE Left 08/28/2020   Procedure: OPEN REDUCTION INTERNAL FIXATION (ORIF) DISTAL FEMUR FRACTURE;  Surgeon: Rod Can, MD;  Location: [place];  Service: Orthopedics;  Laterality: Left;  . TOTAL HIP ARTHROPLASTY Right 10/12/2017   Procedure: RIGHT TOTAL HIP ARTHROPLASTY ANTERIOR  APPROACH;  Surgeon: Gaynelle Arabian, MD;  Location: [facility];  Service: Orthopedics;  Laterality: Right;  . TRANSTHORACIC ECHOCARDIOGRAM  12/2018   Unable to assess diastolic function because of A. fib. Normal RV function, but moderately elevated RVSP.  Severe biatrial enlargement. S/P St Jude bileaflet mechanical MVR that appears to be functioning normally. Mitral valve regurgitation cannot assess due to mechanical valve shadowing.  MV Mean grad: 7.0 mmHg MV Area (PHT): 3.38 cm (stable for valve).  Mild Ao Sclerosis, Mild-Mod AI  . TRANSTHORACIC ECHOCARDIOGRAM  08/'17; 10/'18    a) Mild conc LVH. EF 55-60%. No RWMA. Mod AI. Mechanical MV prosthesis functioning properly. LAD dilation.;; b)  EF 55-60%.  Mo AI.  Bileaflet Saint Jude mechanical MV with no paravalvular leak.  Severe LA dilation.  Minimally elevated PAP    Allergies  Allergen Reactions  . Flexeril [Cyclobenzaprine] Diarrhea    Allergies as of 10/07/2020      Reactions   Flexeril [cyclobenzaprine] Diarrhea      Medication List       Accurate as of October 07, 2020  3:28 PM. If you have any questions, ask your nurse or doctor.        acetaminophen 325 MG tablet Commonly known as: TYLENOL Take 650 mg by mouth in the morning, at noon, and at bedtime.   bisoprolol 5 MG tablet Commonly known as: ZEBETA Take 1 tablet (5 mg total) by mouth daily.   docusate sodium 100 MG capsule Commonly known as: COLACE Take 1 capsule (100 mg total) by mouth 2 (two) times daily. While on narcotics.   fluticasone 50 MCG/ACT nasal spray Commonly known as: FLONASE Place 1 spray into both nostrils daily as needed for allergies or rhinitis.   folic acid 1 MG tablet Commonly known as: FOLVITE Take 1 tablet (1 mg total) by mouth daily.   furosemide 40 MG tablet Commonly known as: LASIX Take 40 mg by mouth. Once A Day on Sun, Tue, Thu, Sat   furosemide 40 MG tablet Commonly known as: LASIX Take 60 mg by mouth. Once A Day on Mon, Wed, Fri   HYDROcodone-acetaminophen 5-325 MG tablet Commonly known as: NORCO/VICODIN Take 1-2 tablets by mouth every 4 (four) hours as needed for moderate pain (pain score 4-6).   isosorbide mononitrate 30 MG 24 hr tablet Commonly known as: IMDUR Take 1 tablet (30 mg total) by mouth daily.   lactose free nutrition Liqd Take 237 mLs by mouth every morning.   LORazepam 1 MG tablet Commonly known as: ATIVAN Take 1 tablet (1 mg  total) by mouth every 8 (eight) hours as needed for anxiety.   methocarbamol 500 MG tablet Commonly known as: ROBAXIN Take 1 tablet (500 mg total) by mouth every 6 (six) hours as needed for muscle spasms.   mirtazapine 7.5 MG tablet Commonly known as: REMERON Take 7.5 mg by mouth at bedtime.   MULTIVITAMIN PO Take 1 tablet by mouth daily.   polyethylene glycol 17 g packet Commonly known as: MIRALAX / GLYCOLAX Take 17 g by mouth daily as needed.   PRESERVISION AREDS 2 PO Take 1 capsule by mouth daily.   thiamine 100 MG tablet Take 1 tablet (100 mg total) by mouth daily.   warfarin 5 MG tablet Commonly known as: COUMADIN Take as directed by the anticoagulation clinic. If you are unsure how to take this medication, talk to your nurse or doctor. Original instructions: Take 4.5 mg by mouth daily.   zinc oxide 20 % ointment  Apply 1 application topically as needed for irritation.       Review of Systems  Constitutional: Negative for appetite change, fatigue and fever.  HENT: Positive for hearing loss. Negative for congestion and voice change.   Respiratory: Negative for cough and shortness of breath.   Cardiovascular: Negative for leg swelling.  Gastrointestinal: Negative for abdominal pain and constipation.  Genitourinary: Negative for difficulty urinating, dysuria and urgency.       Incontinent of urine, uses condom catheter.   Musculoskeletal: Positive for arthralgias and gait problem. Negative for joint swelling.  Skin: Positive for wound.  Neurological: Negative for speech difficulty, weakness and light-headedness.       Memory lapses.   Psychiatric/Behavioral: Negative for behavioral problems and sleep disturbance. The patient is not nervous/anxious.     Immunization History  Administered Date(s) Administered  . Influenza, High Dose Seasonal PF 04/20/2016  . Influenza,inj,Quad PF,6+ Mos 03/28/2018  . Influenza-Unspecified 03/30/2017  . Moderna Sars-Covid-2  Vaccination 07/02/2019, 07/30/2019, 05/12/2020  . Pneumococcal Conjugate-13 06/10/2014  . Pneumococcal-Unspecified 02/02/2006  . Tdap 06/10/2014  . Zoster 03/29/2015   Pertinent  Health Maintenance Due  Topic Date Due  . INFLUENZA VACCINE  01/26/2021  . PNA vac Low Risk Adult  Completed   No flowsheet data found. Functional Status Survey:    Vitals:   10/07/20 1515  BP: 118/72  Pulse: 96  Resp: 18  Temp: 98.9 F (37.2 C)  SpO2: 95%   There is no height or weight on file to calculate BMI. Physical Exam Vitals and nursing note reviewed.  Constitutional:      Appearance: Normal appearance.  HENT:     Head: Normocephalic and atraumatic.     Mouth/Throat:     Mouth: Mucous membranes are moist.  Eyes:     Extraocular Movements: Extraocular movements intact.     Conjunctiva/sclera: Conjunctivae normal.     Pupils: Pupils are equal, round, and reactive to light.  Cardiovascular:     Rate and Rhythm: Normal rate. Rhythm irregular.     Heart sounds: Murmur heard.    Pulmonary:     Effort: Pulmonary effort is normal.     Breath sounds: No rales.  Abdominal:     General: Bowel sounds are normal.     Palpations: Abdomen is soft.     Tenderness: There is no abdominal tenderness.  Musculoskeletal:        General: Tenderness present.     Cervical back: Normal range of motion and neck supple.     Right lower leg: No edema.     Left lower leg: No edema.     Comments: LLE>RLE  Skin:    General: Skin is warm and dry.     Comments: Left hip surgical wound is healed. Dark pigmented venous insufficiency skin changes BLE. A quarter sized superficial open area R buttock, no s/s of infection.   Neurological:     General: No focal deficit present.     Mental Status: He is alert. Mental status is at baseline.     Motor: No weakness.     Coordination: Coordination normal.     Gait: Gait abnormal.  Psychiatric:        Mood and Affect: Mood normal.        Behavior: Behavior normal.         Thought Content: Thought content normal.     Labs reviewed: Recent Labs    08/31/20 0111 09/01/20 0247 09/02/20 0130 09/08/20 0000 09/15/20 0000  09/23/20 0000  NA 133* 133* 134* 134* 131* 131*  K 3.8 3.7 4.0 4.6 4.0 4.1  CL 102 103 104 101 99 99  CO2 24 23 22  25* 23* 24*  GLUCOSE 104* 88 97  --   --   --   BUN 26* 27* 25* 25* 23* 33*  CREATININE 1.00 0.99 1.05 1.1 0.9 1.0  CALCIUM 7.9* 7.9* 8.0* 8.4* 8.1* 8.6*  MG 2.4 2.2 2.1  --   --   --    Recent Labs    08/25/20 1832 08/26/20 0332 09/08/20 0000 09/15/20 0000 09/23/20 0000  AST 39 34 50* 45* 38  ALT 24 21 47* 42* 38  ALKPHOS 54 42 98 111 93  BILITOT 1.1 1.1  --   --   --   PROT 5.8* 5.3*  --   --   --   ALBUMIN 3.4* 2.9* 3.0* 3.0* 3.1*   Recent Labs    08/31/20 0111 09/01/20 0247 09/02/20 0130 09/08/20 0000 09/23/20 0000  WBC 4.4 4.0 4.0 4.9 3.1  NEUTROABS 3.1  --  2.7 3,729.00 2,062.00  HGB 10.4* 10.0* 9.7* 10.8* 10.8*  HCT 31.3* 29.1* 28.1* 32* 33*  MCV 93.4 92.4 92.7  --   --   PLT 122* 144* 164 366 183   No results found for: TSH No results found for: HGBA1C No results found for: CHOL, HDL, LDLCALC, LDLDIRECT, TRIG, CHOLHDL  Significant Diagnostic Results in last 30 days:  No results found.  Assessment/Plan Pressure ulcer of right buttock, stage 2 (HCC) Pressure reduction by assisting the patient repositioning frequently. Barrier oint and foam dressing for now.   Closed displaced fracture of distal epiphysis of left femur ([place]) S/p closed displaced fracture of distal epiphysis of left femur, ORIF 08/28/20, TDW with walker, f/u Ortho  S/P MVR (mitral valve replacement) S/P MVR  [place] of INR 2.5-3.5, takes Coumadin  BPH (benign prostatic hyperplasia) BPH no urinary retention.   Generalized osteoarthritis of multiple sites OA s/p R+L hip arhtroplasties   Longstanding persistent atrial fibrillation: CHA2DS2-VASc Score 3 Heart rate is in control, takes Coumadin, Bisoprolol.     Essential hypertension Blood pressure is in control, takes Bisoprolol, Furosemide, Isosorbide.Na 131, Bun/creat 33/1.0 09/23/20  Macrocytic anemia Macrocytic anemia, post op, transfused PRBC total 7u, Iron 60, Vit B12 478, Folate 44. Hgb10.8 09/23/20   Fall Fall, needs assistance with transfer.   Slow transit constipation Constipation, takes Colace   Insomnia Insomnia, sleeps and eats better, takes Mirtazapine, Lorazepam.     Family/ staff Communication: plan of care reviewed with the patient and charge nurse.   Labs/tests ordered:  none  Time spend 35 minutes.

## 2020-10-07 NOTE — Assessment & Plan Note (Signed)
OA s/p R+L hip arhtroplasties

## 2020-10-07 NOTE — Assessment & Plan Note (Addendum)
Constipation, takes Colace

## 2020-10-13 ENCOUNTER — Telehealth: Payer: Self-pay

## 2020-10-13 DIAGNOSIS — S72032D Displaced midcervical fracture of left femur, subsequent encounter for closed fracture with routine healing: Secondary | ICD-10-CM | POA: Diagnosis not present

## 2020-10-13 NOTE — Telephone Encounter (Signed)
Patient's wife called stating that patient is not doing well. Physical therapy said that patient does not seem to be making progress. He is a [place] patient.She wants Dr. Veleta Miners to know and states that patient does have appointment with PCP. She wants Dr. Veleta Miners to check on him. Patient has been seen by Dr. Lyndel Safe before. Please advise.  Message routed to Dr. Lyndel Safe.

## 2020-10-14 NOTE — Telephone Encounter (Signed)
Discussed with wife. She is concerned about his weight loss. Patient continues to be with poor Appetite Will see him on Thurs.? Possible increase his Remeron. Also Probably back off on His lasix. His Bun and Creat were stable 2 weeks ago.  Talked to the Nurse also Ordered TSH,CBC,CMP tomorrow Will eval on Thurs

## 2020-10-15 ENCOUNTER — Telehealth: Payer: Self-pay

## 2020-10-15 LAB — COMPREHENSIVE METABOLIC PANEL
Albumin: 3.3 — AB (ref 3.5–5.0)
Calcium: 8.8 (ref 8.7–10.7)
Globulin: 3.1

## 2020-10-15 LAB — CBC AND DIFFERENTIAL
HCT: 35 — AB (ref 41–53)
Hemoglobin: 11.4 — AB (ref 13.5–17.5)
Platelets: 248 (ref 150–399)
WBC: 6

## 2020-10-15 LAB — BASIC METABOLIC PANEL
BUN: 53 — AB (ref 4–21)
CO2: 23 — AB (ref 13–22)
Chloride: 102 (ref 99–108)
Creatinine: 1.4 — AB (ref 0.6–1.3)
Glucose: 139
Potassium: 4 (ref 3.4–5.3)
Sodium: 136 — AB (ref 137–147)

## 2020-10-15 LAB — HEPATIC FUNCTION PANEL
ALT: 164 — AB (ref 10–40)
AST: 122 — AB (ref 14–40)
Alkaline Phosphatase: 95 (ref 25–125)
Bilirubin, Total: 0.8

## 2020-10-15 LAB — TSH: TSH: 2.39 (ref 0.41–5.90)

## 2020-10-15 LAB — CBC: RBC: 3.62 — AB (ref 3.87–5.11)

## 2020-10-15 NOTE — Telephone Encounter (Signed)
lmom for overdue inr 

## 2020-10-16 ENCOUNTER — Encounter: Payer: Self-pay | Admitting: Internal Medicine

## 2020-10-16 ENCOUNTER — Non-Acute Institutional Stay (SKILLED_NURSING_FACILITY): Payer: Medicare Other | Admitting: Internal Medicine

## 2020-10-16 DIAGNOSIS — R310 Gross hematuria: Secondary | ICD-10-CM | POA: Diagnosis not present

## 2020-10-16 DIAGNOSIS — R300 Dysuria: Secondary | ICD-10-CM | POA: Diagnosis not present

## 2020-10-16 DIAGNOSIS — R195 Other fecal abnormalities: Secondary | ICD-10-CM

## 2020-10-16 DIAGNOSIS — R634 Abnormal weight loss: Secondary | ICD-10-CM | POA: Diagnosis not present

## 2020-10-16 DIAGNOSIS — R7989 Other specified abnormal findings of blood chemistry: Secondary | ICD-10-CM

## 2020-10-16 NOTE — Progress Notes (Addendum)
Location:    [facility] Room Number: 29 Place of Service:  SNF [phone]) Provider:  Veleta Miners MD  [name], MD  Patient Care Team: [name], MD as PCP - General (Internal Medicine)  Extended Emergency Contact Information Primary Emergency Contact: Masahiro, [place] Mobile Phone: [phone] Relation: Son Secondary Emergency Contact: [facility] Address: [address]          [place]  [place] Home Phone: [phone] Relation: Spouse  Code Status:  Full Code Goals of care: Advanced Directive information Advanced Directives 09/26/2020  Does Patient Have a Medical Advance Directive? Yes  Type of Advance Directive Out of facility DNR (pink MOST or yellow form);Living will  Does patient want to make changes to medical advance directive? -  Copy of Wiley in Chart? -  Would patient like information on creating a medical advance directive? -  Pre-existing out of facility DNR order (yellow form or pink MOST form) Pink MOST form placed in chart (order not valid for inpatient use);Yellow form placed in chart (order not valid for inpatient use)     Chief Complaint  Patient presents with  . Acute Visit    Weight loss    HPI:  Pt is a 87 y.o. male seen today for an acute visit for Weight loss,Dysuria and weakness  Admitted in thehospital from 2/28-3/8Closed displaced fracture of distal epiphysis of left femur Patient has a history of PAF, mechanical mitral valve is 97 on Coumadin, BPH, previous history of right and left hip arthroplasty, hypertension.  Patient had a mechanical fall when he was with his wife outside. Came to ED fair he was found to have acute fracture of distal left Periprosthetic femoral Fracture UnderwentORIF on 3/3  Continuous to loose weight and very  Poor Appetite No abdominal Pain. No Nausea.  C/o Loose stools. Also c/o Dysuria and Blood Tinged today No Fever or Chills. Finished  Antibiotics for UTI few weeks ago 3/24  Per therapy he did do well with walking yesterday but today just fatigue. Did walk with mild assist Pain Seems Controlled Denies depression. Sleeping well Had one stool was loose Also had Hematuria Mild per therapy  Past Medical History:  Diagnosis Date  . Anemia    leakoppenia  . BPH (benign prostatic hypertrophy)   . Bullous pemphigoid    [name], March 2011, right forearm squamous cell carcinoma  . Chronic anticoagulation    systemic  . Colon polyp    transverse, 2002  . History of peptic ulcer    remote, 3/95  . Hx of actinic keratosis   . Hx of basal cell carcinoma   . Hx of squamous cell carcinoma of skin   . Hyperlipidemia   . Left inguinal hernia   . Moderate aortic insufficiency 2009   audible aortic insufficiency on 1/09 echo  . PAF (paroxysmal atrial fibrillation) ([facility]) 01/17/2014   On Warfarin.  . S/P mitral valve replacement with metallic valve 23/3007   INR goal 2.5-3.5, St Jude,   . 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 Electrodesiccation and Curettage and Shave Biopsy Right    Right medial, anterio tibia: Well differentiated Squamous Cell  . hip replacements Left    10 years ago  . MITRAL VALVE REPLACEMENT  03/1996   St. Jude mechanical valve  . ORIF FEMUR FRACTURE Left 08/28/2020   Procedure: OPEN  REDUCTION INTERNAL FIXATION (ORIF) DISTAL FEMUR FRACTURE;  Surgeon: Rod Can, MD;  Location: [facility];  Service: Orthopedics;  Laterality: Left;  . TOTAL HIP ARTHROPLASTY Right 10/12/2017   Procedure: RIGHT TOTAL HIP ARTHROPLASTY ANTERIOR APPROACH;  Surgeon: Gaynelle Arabian, MD;  Location: [facility];  Service: Orthopedics;  Laterality: Right;  . TRANSTHORACIC ECHOCARDIOGRAM  12/2018   Unable to assess diastolic function because of A. fib. Normal RV function, but moderately elevated RVSP.  Severe biatrial enlargement. S/P St Jude  bileaflet mechanical MVR that appears to be functioning normally. Mitral valve regurgitation cannot assess due to mechanical valve shadowing. MV Mean grad: 7.0 mmHg MV Area (PHT): 3.38 cm (stable for valve).  Mild Ao Sclerosis, Mild-Mod AI  . TRANSTHORACIC ECHOCARDIOGRAM  08/'17; 10/'18    a) Mild conc LVH. EF 55-60%. No RWMA. Mod AI. Mechanical MV prosthesis functioning properly. LAD dilation.;; b)  EF 55-60%.  Mo AI.  Bileaflet Saint Jude mechanical MV with no paravalvular leak.  Severe LA dilation.  Minimally elevated PAP    Allergies  Allergen Reactions  . Flexeril [Cyclobenzaprine] Diarrhea    Allergies as of 10/16/2020      Reactions   Flexeril [cyclobenzaprine] Diarrhea      Medication List       Accurate as of October 16, 2020 10:57 AM. If you have any questions, ask your nurse or doctor.        acetaminophen 325 MG tablet Commonly known as: TYLENOL Take 650 mg by mouth in the morning, at noon, and at bedtime.   bisoprolol 5 MG tablet Commonly known as: ZEBETA Take 1 tablet (5 mg total) by mouth daily.   docusate sodium 100 MG capsule Commonly known as: COLACE Take 100 mg by mouth daily as needed for mild constipation. What changed: Another medication with the same name was removed. Continue taking this medication, and follow the directions you see here. Changed by: Virgie Dad, MD   fluticasone 50 MCG/ACT nasal spray Commonly known as: FLONASE Place 1 spray into both nostrils daily as needed for allergies or rhinitis.   folic acid 1 MG tablet Commonly known as: FOLVITE Take 1 tablet (1 mg total) by mouth daily.   furosemide 40 MG tablet Commonly known as: LASIX Take 40 mg by mouth. Once A Day on Sun, Tue, Thu, Sat   furosemide 40 MG tablet Commonly known as: LASIX Take 60 mg by mouth. Once A Day on Mon, Wed, Fri   HYDROcodone-acetaminophen 5-325 MG tablet Commonly known as: NORCO/VICODIN Take 1-2 tablets by mouth every 4 (four) hours as needed for  moderate pain (pain score 4-6).   isosorbide mononitrate 30 MG 24 hr tablet Commonly known as: IMDUR Take 1 tablet (30 mg total) by mouth daily.   lactose free nutrition Liqd Take 237 mLs by mouth every morning.   LORazepam 1 MG tablet Commonly known as: ATIVAN Take 1 tablet (1 mg total) by mouth every 8 (eight) hours as needed for anxiety.   methocarbamol 500 MG tablet Commonly known as: ROBAXIN Take 1 tablet (500 mg total) by mouth every 6 (six) hours as needed for muscle spasms.   mirtazapine 7.5 MG tablet Commonly known as: REMERON Take 7.5 mg by mouth at bedtime.   MULTIVITAMIN PO Take 1 tablet by mouth daily.   polyethylene glycol 17 g packet Commonly known as: MIRALAX / GLYCOLAX Take 17 g by mouth daily as needed.   PRESERVISION AREDS 2 PO Take 1 capsule by mouth daily.   thiamine  100 MG tablet Take 1 tablet (100 mg total) by mouth daily.   warfarin 4 MG tablet Commonly known as: COUMADIN Take as directed by the anticoagulation clinic. If you are unsure how to take this medication, talk to your nurse or doctor. Original instructions: Take 4 mg by mouth daily.   zinc oxide 20 % ointment Apply 1 application topically as needed for irritation.       Review of Systems  Constitutional: Positive for activity change, appetite change and unexpected weight change.  HENT: Negative.   Respiratory: Negative.   Cardiovascular: Negative.   Gastrointestinal: Positive for diarrhea.  Genitourinary: Positive for dysuria and hematuria.  Musculoskeletal: Positive for gait problem.  Skin: Negative.   Neurological: Positive for weakness.  Psychiatric/Behavioral: Negative.     Immunization History  Administered Date(s) Administered  . Influenza, High Dose Seasonal PF 04/20/2016  . Influenza,inj,Quad PF,6+ Mos 03/28/2018  . Influenza-Unspecified 03/30/2017  . Moderna Sars-Covid-2 Vaccination 07/02/2019, 07/30/2019, 05/12/2020  . Pneumococcal Conjugate-13 06/10/2014  .  Pneumococcal-Unspecified 02/02/2006  . Tdap 06/10/2014  . Zoster 03/29/2015   Pertinent  Health Maintenance Due  Topic Date Due  . INFLUENZA VACCINE  01/26/2021  . PNA vac Low Risk Adult  Completed   No flowsheet data found. Functional Status Survey:    Vitals:   10/16/20 1048  BP: 125/60  Pulse: 98  Resp: 20  Temp: (!) 97.3 F (36.3 C)  SpO2: 96%  Weight: 137 lb 1.6 oz (62.2 kg)  Height: 6' (1.829 m)   Body mass index is 18.59 kg/m. Physical Exam Vitals reviewed.  Constitutional:      Appearance: Normal appearance.  HENT:     Head: Normocephalic.     Nose: Nose normal.     Mouth/Throat:     Mouth: Mucous membranes are moist.     Pharynx: Oropharynx is clear.  Eyes:     Pupils: Pupils are equal, round, and reactive to light.  Cardiovascular:     Rate and Rhythm: Normal rate. Rhythm irregular.     Pulses: Normal pulses.  Pulmonary:     Effort: Pulmonary effort is normal.     Breath sounds: Normal breath sounds. No wheezing or rales.  Abdominal:     General: Abdomen is flat. Bowel sounds are normal. There is no distension.     Palpations: Abdomen is soft.     Tenderness: There is no abdominal tenderness. There is no guarding.  Musculoskeletal:        General: No swelling.     Cervical back: Neck supple.     Comments: Chronic Changes in Lower extremities  Skin:    General: Skin is warm.  Neurological:     General: No focal deficit present.     Mental Status: He is alert and oriented to person, place, and time.  Psychiatric:        Mood and Affect: Mood normal.        Thought Content: Thought content normal.     Labs reviewed: Recent Labs    08/31/20 0111 09/01/20 0247 09/02/20 0130 09/08/20 0000 09/15/20 0000 09/23/20 0000 10/15/20 0000  NA 133* 133* 134*   < > 131* 131* 136*  K 3.8 3.7 4.0   < > 4.0 4.1 4.0  CL 102 103 104   < > 99 99 102  CO2 24 23 22    < > 23* 24* 23*  GLUCOSE 104* 88 97  --   --   --   --  BUN 26* 27* 25*   < > 23* 33*  53*  CREATININE 1.00 0.99 1.05   < > 0.9 1.0 1.4*  CALCIUM 7.9* 7.9* 8.0*   < > 8.1* 8.6* 8.8  MG 2.4 2.2 2.1  --   --   --   --    < > = values in this interval not displayed.   Recent Labs    08/25/20 1832 08/26/20 0332 09/08/20 0000 09/15/20 0000 09/23/20 0000 10/15/20 0000  AST 39 34   < > 45* 38 122*  ALT 24 21   < > 42* 38 164*  ALKPHOS 54 42   < > 111 93 95  BILITOT 1.1 1.1  --   --   --   --   PROT 5.8* 5.3*  --   --   --   --   ALBUMIN 3.4* 2.9*   < > 3.0* 3.1* 3.3*   < > = values in this interval not displayed.   Recent Labs    08/31/20 0111 09/01/20 0247 09/02/20 0130 09/08/20 0000 09/23/20 0000 10/15/20 0000  WBC 4.4 4.0 4.0 4.9 3.1 6.0  NEUTROABS 3.1  --  2.7 3,729.00 2,062.00  --   HGB 10.4* 10.0* 9.7* 10.8* 10.8* 11.4*  HCT 31.3* 29.1* 28.1* 32* 33* 35*  MCV 93.4 92.4 92.7  --   --   --   PLT 122* 144* 164 366 183 248   Lab Results  Component Value Date   TSH 2.39 10/15/2020   No results found for: HGBA1C No results found for: CHOL, HDL, LDLCALC, LDLDIRECT, TRIG, CHOLHDL  Significant Diagnostic Results in last 30 days:  No results found.  Assessment/Plan Dysuria Sending Urine for Culture again Start on Flomax  Gross hematuria Renal US for Obstruction Weight loss Change Remeron to 15 mg QHS Has had significant weight loss D/W Wife. If he still keeps doing worse she wants him evaluated By GI  Loose stools Do Stool for C Diff and Other Pathogens GI eval if Continues  Elevated LFTs Ordered RUQ [place] Has h/o Alcohal Abuse  Diastolic CHF Change lasix to 20 mg QD as Bun elevated today  D/W Wife she wants him off Lasix completly as she thinks he was not taking it at home Not eating well  CKD  Worsening BUN and Creat Stop Lasix ACP D/w wife in detail She is very upset about him not making any improvement She wants everything done on him I have talked to DON. He stays Full Code and if any changes over the weekend he goes to  ED  Family/ staff Communication:   Labs/tests ordered:  Hepatic panel and BMP in 1 week

## 2020-10-20 ENCOUNTER — Non-Acute Institutional Stay (SKILLED_NURSING_FACILITY): Payer: Medicare Other | Admitting: Orthopedic Surgery

## 2020-10-20 ENCOUNTER — Encounter: Payer: Self-pay | Admitting: Orthopedic Surgery

## 2020-10-20 DIAGNOSIS — R310 Gross hematuria: Secondary | ICD-10-CM | POA: Diagnosis not present

## 2020-10-20 DIAGNOSIS — R634 Abnormal weight loss: Secondary | ICD-10-CM | POA: Diagnosis not present

## 2020-10-20 DIAGNOSIS — S72442D Displaced fracture of lower epiphysis (separation) of left femur, subsequent encounter for closed fracture with routine healing: Secondary | ICD-10-CM | POA: Diagnosis not present

## 2020-10-20 DIAGNOSIS — I4811 Longstanding persistent atrial fibrillation: Secondary | ICD-10-CM | POA: Diagnosis not present

## 2020-10-20 DIAGNOSIS — I10 Essential (primary) hypertension: Secondary | ICD-10-CM

## 2020-10-20 DIAGNOSIS — R195 Other fecal abnormalities: Secondary | ICD-10-CM

## 2020-10-20 DIAGNOSIS — N400 Enlarged prostate without lower urinary tract symptoms: Secondary | ICD-10-CM | POA: Diagnosis not present

## 2020-10-20 DIAGNOSIS — L89312 Pressure ulcer of right buttock, stage 2: Secondary | ICD-10-CM | POA: Diagnosis not present

## 2020-10-20 DIAGNOSIS — N3001 Acute cystitis with hematuria: Secondary | ICD-10-CM | POA: Diagnosis not present

## 2020-10-20 DIAGNOSIS — I5032 Chronic diastolic (congestive) heart failure: Secondary | ICD-10-CM | POA: Diagnosis not present

## 2020-10-20 DIAGNOSIS — R7989 Other specified abnormal findings of blood chemistry: Secondary | ICD-10-CM

## 2020-10-20 NOTE — Progress Notes (Signed)
Location:  [facility] Room Number: [place] of Service:  SNF [phone]) Provider:  Windell Moulding, AGNP-C  [name], MD  Patient Care Team: [name], MD as PCP - General (Internal Medicine)  Extended Emergency Contact Information Primary Emergency Contact: Nivaan, Dicenzo Mobile Phone: [phone] Relation: Son Secondary Emergency Contact: [facility] Address: [address]          [place] Home Phone: [phone] Relation: Spouse  Goals of care: Advanced Directive information Advanced Directives 09/26/2020  Does Patient Have a Medical Advance Directive? Yes  Type of Advance Directive Out of facility DNR (pink MOST or yellow form);Living will  Does patient want to make changes to medical advance directive? -  Copy of [place] in Chart? -  Would patient like information on creating a medical advance directive? -  Pre-existing out of facility DNR order (yellow form or pink MOST form) Pink MOST form placed in chart (order not valid for inpatient use);Yellow form placed in chart (order not valid for inpatient use)     Chief Complaint  Patient presents with  . Medical Management of Chronic Issues    Routine follow up visit    HPI:  Pt is a 87 y.o. male seen today for medical management of chronic diseases.    Today, he is laying in bed during encounter. Alert and oriented x 4. Follows commands and can express needs. Seen 04/21 for symptoms of dysuria and hematuria, urine culture ordered and flomax started. Culture results positive for proteus mirabilis. He states dysuria has not resolved over the weekend. Afebrile. Hematuria varies. Renal ultrasound scheduled to be performed 04/26.   04/18- he was seen by Dr. Lyla Glassing for left femur fracture. Ambulating about 10-20 feet, remains minimal assist with ADLs and transfers. He is often fatigued at the beginning of therapy sessions and requires verbal cues to complete  tasks. He has not taken norco since 03/11. He states his right leg pain is minimal, rated 2/10. No recent falls or injuries. Scheduled to f/u with Dr. Lyla Glassing end of May, 2022.   He continues to have poor appetite with weight loss. He states" I have to force myself to eat at times, I have no appetite." In addition, he is receiving Boost bid. He tries to eat protein on his plate, does not always care for foods that are served. Remeron recently increased to 15 mg daily. RUQ ordered for elevated LFT.   Weight trends are as follows:  04/20- 137.1 lbs  03/23- 149.3 lbs  03/08- 155.5 lbs  Recent blood pressures are as follows:  04/25- 116/60  04/24- 113/63  04/23- 114/60  ACP discussed with wife and Dr. Lyndel Safe 04/21- he remains full code status.   Nurse does not report any other concerns, vitals stable.    Past Medical History:  Diagnosis Date  . Anemia    leakoppenia  . BPH (benign prostatic hypertrophy)   . Bullous pemphigoid    [name], March 2011, right forearm squamous cell carcinoma  . Chronic anticoagulation    systemic  . Colon polyp    transverse, 2002  . History of peptic ulcer    remote, 3/95  . Hx of actinic keratosis   . Hx of basal cell carcinoma   . Hx of squamous cell carcinoma of skin   . Hyperlipidemia   . Left inguinal hernia   . Moderate aortic insufficiency 2009   audible aortic insufficiency on 1/09 echo  . PAF (  paroxysmal atrial fibrillation) ([place]) 01/17/2014   On Warfarin.  . S/P mitral valve replacement with metallic valve 66/0630   INR goal 2.5-3.5, St Jude,   . 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 Electrodesiccation and Curettage and Shave Biopsy Right    Right medial, anterio tibia: Well differentiated Squamous Cell  . hip replacements Left    10 years ago  . MITRAL VALVE REPLACEMENT  03/1996   St. Jude mechanical valve  . ORIF FEMUR FRACTURE Left  08/28/2020   Procedure: OPEN REDUCTION INTERNAL FIXATION (ORIF) DISTAL FEMUR FRACTURE;  Surgeon: Rod Can, MD;  Location: [facility];  Service: Orthopedics;  Laterality: Left;  . TOTAL HIP ARTHROPLASTY Right 10/12/2017   Procedure: RIGHT TOTAL HIP ARTHROPLASTY ANTERIOR APPROACH;  Surgeon: Gaynelle Arabian, MD;  Location: [facility];  Service: Orthopedics;  Laterality: Right;  . TRANSTHORACIC ECHOCARDIOGRAM  12/2018   Unable to assess diastolic function because of A. fib. Normal RV function, but moderately elevated RVSP.  Severe biatrial enlargement. S/P St Jude bileaflet mechanical MVR that appears to be functioning normally. Mitral valve regurgitation cannot assess due to mechanical valve shadowing. MV Mean grad: 7.0 mmHg MV Area (PHT): 3.38 cm (stable for valve).  Mild Ao Sclerosis, Mild-Mod AI  . TRANSTHORACIC ECHOCARDIOGRAM  08/'17; 10/'18    a) Mild conc LVH. EF 55-60%. No RWMA. Mod AI. Mechanical MV prosthesis functioning properly. LAD dilation.;; b)  EF 55-60%.  Mo AI.  Bileaflet Saint Jude mechanical MV with no paravalvular leak.  Severe LA dilation.  Minimally elevated PAP    Allergies  Allergen Reactions  . Flexeril [Cyclobenzaprine] Diarrhea    Outpatient Encounter Medications as of 10/20/2020  Medication Sig  . acetaminophen (TYLENOL) 325 MG tablet Take 650 mg by mouth in the morning, at noon, and at bedtime.  . bisoprolol (ZEBETA) 5 MG tablet Take 1 tablet (5 mg total) by mouth daily.  Marland Kitchen docusate sodium (COLACE) 100 MG capsule Take 100 mg by mouth daily as needed for mild constipation.  . fluticasone (FLONASE) 50 MCG/ACT nasal spray Place 1 spray into both nostrils daily as needed for allergies or rhinitis.  . folic acid (FOLVITE) 1 MG tablet Take 1 tablet (1 mg total) by mouth daily.  . furosemide (LASIX) 40 MG tablet Take 20 mg by mouth daily.  Marland Kitchen HYDROcodone-acetaminophen (NORCO/VICODIN) 5-325 MG tablet Take 1-2 tablets by mouth every 4 (four) hours as needed for moderate pain (pain  score 4-6).  . isosorbide mononitrate (IMDUR) 30 MG 24 hr tablet Take 1 tablet (30 mg total) by mouth daily.  Marland Kitchen lactose free nutrition (BOOST) LIQD Take 237 mLs by mouth every morning.  Marland Kitchen LORazepam (ATIVAN) 1 MG tablet Take 1 tablet (1 mg total) by mouth every 8 (eight) hours as needed for anxiety.  . methocarbamol (ROBAXIN) 500 MG tablet Take 1 tablet (500 mg total) by mouth every 6 (six) hours as needed for muscle spasms.  . mirtazapine (REMERON) 7.5 MG tablet Take 15 mg by mouth.  . Multiple Vitamins-Minerals (MULTIVITAMIN PO) Take 1 tablet by mouth daily.  . Multiple Vitamins-Minerals (PRESERVISION AREDS 2 PO) Take 1 capsule by mouth daily.  . polyethylene glycol (MIRALAX / GLYCOLAX) 17 g packet Take 17 g by mouth daily as needed.  . thiamine 100 MG tablet Take 1 tablet (100 mg total) by mouth daily.  Marland Kitchen warfarin (COUMADIN) 4 MG tablet Take 4 mg by  mouth daily.  Marland Kitchen zinc oxide 20 % ointment Apply 1 application topically as needed for irritation.   No facility-administered encounter medications on file as of 10/20/2020.    Review of Systems  Constitutional: Positive for appetite change and unexpected weight change. Negative for activity change, fatigue and fever.  HENT: Negative for dental problem and trouble swallowing.   Eyes: Negative for visual disturbance.  Respiratory: Negative for cough, shortness of breath and wheezing.   Cardiovascular: Negative for chest pain and leg swelling.  Gastrointestinal: Positive for diarrhea. Negative for abdominal distention, abdominal pain, constipation and nausea.       Loss of appetite  Genitourinary: Positive for dysuria and hematuria. Negative for frequency.  Musculoskeletal: Positive for arthralgias and myalgias.       Right hip pain  Skin:       Surgical incision  Neurological: Positive for weakness. Negative for dizziness and headaches.  Hematological: Bruises/bleeds easily.  Psychiatric/Behavioral: Negative for confusion and dysphoric mood.  The patient is not nervous/anxious.     Immunization History  Administered Date(s) Administered  . Influenza, High Dose Seasonal PF 04/20/2016  . Influenza,inj,Quad PF,6+ Mos 03/28/2018  . Influenza-Unspecified 03/30/2017  . Moderna Sars-Covid-2 Vaccination 07/02/2019, 07/30/2019, 05/12/2020  . Pneumococcal Conjugate-13 06/10/2014  . Pneumococcal-Unspecified 02/02/2006  . Tdap 06/10/2014  . Zoster 03/29/2015   Pertinent  Health Maintenance Due  Topic Date Due  . INFLUENZA VACCINE  01/26/2021  . PNA vac Low Risk Adult  Completed   No flowsheet data found. Functional Status Survey:    Vitals:   10/20/20 1152  BP: 116/60  Pulse: 98  Resp: 20  Temp: (!) 97.2 F (36.2 C)  SpO2: 94%  Weight: 137 lb 1.6 oz (62.2 kg)  Height: 6' (1.829 m)   Body mass index is 18.59 kg/m. Physical Exam Vitals reviewed.  HENT:     Head: Normocephalic.     Right Ear: There is no impacted cerumen.     Left Ear: There is no impacted cerumen.     Nose: Nose normal.     Mouth/Throat:     Mouth: Mucous membranes are moist.  Eyes:     General:        Right eye: No discharge.        Left eye: No discharge.  Cardiovascular:     Rate and Rhythm: Normal rate. Rhythm irregular.     Pulses: Normal pulses.     Heart sounds: Murmur heard.    Pulmonary:     Effort: Pulmonary effort is normal. No respiratory distress.     Breath sounds: Normal breath sounds. No wheezing.  Abdominal:     General: Bowel sounds are normal. There is no distension.     Palpations: Abdomen is soft.     Tenderness: There is no abdominal tenderness.  Musculoskeletal:     Cervical back: Normal range of motion.     Right lower leg: No edema.     Left lower leg: No edema.  Lymphadenopathy:     Cervical: No cervical adenopathy.  Skin:    General: Skin is warm and dry.     Capillary Refill: Capillary refill takes less than 2 seconds.     Comments: BLE purple with dry, flaking skin. No open skin wounds.   Left femur  incision x 2, closed, no drainage, surrounding skin intact.   Neurological:     General: No focal deficit present.     Mental Status: He is alert and oriented to person,  place, and time.     Motor: Weakness present.     Gait: Gait abnormal.     Comments: Walker/Wheelchair  Psychiatric:        Mood and Affect: Mood normal.        Behavior: Behavior normal.     Labs reviewed: Recent Labs    08/31/20 0111 09/01/20 0247 09/02/20 0130 09/08/20 0000 09/15/20 0000 09/23/20 0000 10/15/20 0000  NA 133* 133* 134*   < > 131* 131* 136*  K 3.8 3.7 4.0   < > 4.0 4.1 4.0  CL 102 103 104   < > 99 99 102  CO2 24 23 22    < > 23* 24* 23*  GLUCOSE 104* 88 97  --   --   --   --   BUN 26* 27* 25*   < > 23* 33* 53*  CREATININE 1.00 0.99 1.05   < > 0.9 1.0 1.4*  CALCIUM 7.9* 7.9* 8.0*   < > 8.1* 8.6* 8.8  MG 2.4 2.2 2.1  --   --   --   --    < > = values in this interval not displayed.   Recent Labs    08/25/20 1832 08/26/20 0332 09/08/20 0000 09/15/20 0000 09/23/20 0000 10/15/20 0000  AST 39 34   < > 45* 38 122*  ALT 24 21   < > 42* 38 164*  ALKPHOS 54 42   < > 111 93 95  BILITOT 1.1 1.1  --   --   --   --   PROT 5.8* 5.3*  --   --   --   --   ALBUMIN 3.4* 2.9*   < > 3.0* 3.1* 3.3*   < > = values in this interval not displayed.   Recent Labs    08/31/20 0111 09/01/20 0247 09/02/20 0130 09/08/20 0000 09/23/20 0000 10/15/20 0000  WBC 4.4 4.0 4.0 4.9 3.1 6.0  NEUTROABS 3.1  --  2.7 3,729.00 2,062.00  --   HGB 10.4* 10.0* 9.7* 10.8* 10.8* 11.4*  HCT 31.3* 29.1* 28.1* 32* 33* 35*  MCV 93.4 92.4 92.7  --   --   --   PLT 122* 144* 164 366 183 248   Lab Results  Component Value Date   TSH 2.39 10/15/2020   No results found for: HGBA1C No results found for: CHOL, HDL, LDLCALC, LDLDIRECT, TRIG, CHOLHDL  Significant Diagnostic Results in last 30 days:  No results found.  Assessment/Plan 1. Acute cystitis with hematuria - positive for proteus mirabilis - dysuria with  intermittent hematuria - UTI March 2022 - cipro 500 mg po bid x 7 days - referral to urology for frequent UTI  2. Gross hematuria - intermittent  - renal u/s ordered- not completed  3. Weight loss - ongoing, he has loss of appetite - suspect due to advanced age and recent femur fracture - not on narcotics - remeron increased to 15 mg daily - cont Boost - advised patient and wife to eat high calorie foods like cake and cookies  4. Loose stools - resolved since 04/21 - c-diff negative  5. Elevated LFTs - denies upper abdominal pain, history of alcohol use - RUQ abdominal u/s to be done 04/26 - hepatic panel- future  6. Pressure ulcer of right buttock, stage 2 (HCC) - slow healing - cont daily care b wound nurse - promote frequent position changes  7. Closed displaced fracture of distal epiphysis of left femur with routine  healing, subsequent encounter - ongoing, needing verbal cues during sessions, easily fatigued - ambulating about 10-42ft, minimal pain, no recent narcotic use - cont PT/OT - f/u with ortho May 2022  8. Benign prostatic hyperplasia without lower urinary tract symptoms - stable with flomax  9. Longstanding persistent atrial fibrillation: CHA2DS2-VASc Score 3 - rate controlled with bisoprolol - cont coumadin for clot prevention  10. Essential hypertension - bp at goal < 150/90 - cont bisoprolol and imdur  11. Chronic diastolic heart failure - no weight fluctuations, sob or ankle edema - lasix recently discontinued per wife request    Family/ staff Communication: plan discussed with patient and nurse  Labs/tests ordered:  Awaiting RUQ and renal u/s/ hepatic panel and bmp in 1 week

## 2020-10-22 DIAGNOSIS — R946 Abnormal results of thyroid function studies: Secondary | ICD-10-CM | POA: Diagnosis not present

## 2020-10-23 ENCOUNTER — Non-Acute Institutional Stay (SKILLED_NURSING_FACILITY): Payer: Medicare Other | Admitting: Internal Medicine

## 2020-10-23 ENCOUNTER — Telehealth: Payer: Self-pay | Admitting: Cardiology

## 2020-10-23 ENCOUNTER — Encounter: Payer: Self-pay | Admitting: Internal Medicine

## 2020-10-23 DIAGNOSIS — L89312 Pressure ulcer of right buttock, stage 2: Secondary | ICD-10-CM

## 2020-10-23 DIAGNOSIS — R634 Abnormal weight loss: Secondary | ICD-10-CM

## 2020-10-23 DIAGNOSIS — Z952 Presence of prosthetic heart valve: Secondary | ICD-10-CM | POA: Diagnosis not present

## 2020-10-23 DIAGNOSIS — N3001 Acute cystitis with hematuria: Secondary | ICD-10-CM

## 2020-10-23 DIAGNOSIS — I4811 Longstanding persistent atrial fibrillation: Secondary | ICD-10-CM

## 2020-10-23 DIAGNOSIS — R7989 Other specified abnormal findings of blood chemistry: Secondary | ICD-10-CM | POA: Diagnosis not present

## 2020-10-23 DIAGNOSIS — I10 Essential (primary) hypertension: Secondary | ICD-10-CM | POA: Diagnosis not present

## 2020-10-23 DIAGNOSIS — R195 Other fecal abnormalities: Secondary | ICD-10-CM

## 2020-10-23 DIAGNOSIS — S72442D Displaced fracture of lower epiphysis (separation) of left femur, subsequent encounter for closed fracture with routine healing: Secondary | ICD-10-CM | POA: Diagnosis not present

## 2020-10-23 LAB — BASIC METABOLIC PANEL
BUN: 33 — AB (ref 4–21)
CO2: 23 — AB (ref 13–22)
Chloride: 104 (ref 99–108)
Creatinine: 1.2 (ref 0.6–1.3)
Glucose: 84
Potassium: 4.6 (ref 3.4–5.3)
Sodium: 134 — AB (ref 137–147)

## 2020-10-23 LAB — COMPREHENSIVE METABOLIC PANEL
Albumin: 5.7 — AB (ref 3.5–5.0)
Calcium: 8.6 — AB (ref 8.7–10.7)
GFR calc Af Amer: 61
GFR calc non Af Amer: 52
Globulin: 2.8

## 2020-10-23 LAB — HEPATIC FUNCTION PANEL
ALT: 96 — AB (ref 10–40)
AST: 50 — AB (ref 14–40)
Alkaline Phosphatase: 77 (ref 25–125)
Bilirubin, Direct: 0.1 (ref 0.01–0.4)
Bilirubin, Total: 0.5

## 2020-10-23 NOTE — Progress Notes (Signed)
Location:    [facility] Room Number: 29 Place of Service:  SNF [phone]) Provider:  Veleta Miners MD  [name], MD  Patient Care Team: [name], MD as PCP - General (Internal Medicine)  Extended Emergency Contact Information Primary Emergency Contact: Fabiano, Ginley Mobile Phone: [phone] Relation: Son Secondary Emergency Contact: [facility] Address: [address]          [place]  [place] Home Phone: [phone] Relation: Spouse  Code Status:  Full Code Goals of care: Advanced Directive information Advanced Directives 10/20/2020  Does Patient Have a Medical Advance Directive? No  Type of Advance Directive -  Does patient want to make changes to medical advance directive? No - Patient declined  Copy of [place] in Chart? -  Would patient like information on creating a medical advance directive? -  Pre-existing out of facility DNR order (yellow form or pink MOST form) -     Chief Complaint  Patient presents with  . Acute Visit    Follow up    HPI:  Pt is a 87 y.o. male seen today for an acute visit for Follow up and d/w his SON  Admitted in thehospital from 2/28-3/8Closed Periprostheticdisplaced fracture of distal epiphysis of left femur Patient has a history of PAF, mechanical mitral valve is 97 on Coumadin, BPH, previous history of right and left hip arthroplasty, hypertension.  Patient had a mechanical fall when he was with his wife outside. Came to ED  he was found to have acute fracture of distal leftPeriprostheticfemoralFracture UnderwentORIF on 3/3  Weight loss with Poor Appetite No Abdominal Pain No Nausea. Diarrhea resolved at this time Has lost around 10-12 lbs since been here. Family very concerned Says he is not hungry. Does not like the food Now on Remeron. Off his lasix due to increase in his BUN and Creat BP running in low side and he c/o Light headiness when  sitting  Also on Cipro for his UTI.  Feels much better. Still gets Fatigue easily Pain seems controlled. Denies Depression.  Walking with therapy but with assist. Very Low Endurance      Past Medical History:  Diagnosis Date  . Anemia    leakoppenia  . BPH (benign prostatic hypertrophy)   . Bullous pemphigoid    [name], March 2011, right forearm squamous cell carcinoma  . Chronic anticoagulation    systemic  . Colon polyp    transverse, 2002  . History of peptic ulcer    remote, 3/95  . Hx of actinic keratosis   . Hx of basal cell carcinoma   . Hx of squamous cell carcinoma of skin   . Hyperlipidemia   . Left inguinal hernia   . Moderate aortic insufficiency 2009   audible aortic insufficiency on 1/09 echo  . PAF (paroxysmal atrial fibrillation) ([facility]) 01/17/2014   On Warfarin.  . S/P mitral valve replacement with metallic valve 67/8938   INR goal 2.5-3.5, St Jude,   . 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 Electrodesiccation and Curettage and Shave Biopsy Right    Right medial, anterio tibia: Well differentiated Squamous Cell  . hip replacements Left    10 years ago  . MITRAL VALVE REPLACEMENT  03/1996   St. Jude mechanical valve  . ORIF FEMUR FRACTURE Left 08/28/2020   Procedure: OPEN REDUCTION INTERNAL FIXATION (ORIF) DISTAL FEMUR  FRACTURE;  Surgeon: Rod Can, MD;  Location: [facility];  Service: Orthopedics;  Laterality: Left;  . TOTAL HIP ARTHROPLASTY Right 10/12/2017   Procedure: RIGHT TOTAL HIP ARTHROPLASTY ANTERIOR APPROACH;  Surgeon: Gaynelle Arabian, MD;  Location: [facility];  Service: Orthopedics;  Laterality: Right;  . TRANSTHORACIC ECHOCARDIOGRAM  12/2018   Unable to assess diastolic function because of A. fib. Normal RV function, but moderately elevated RVSP.  Severe biatrial enlargement. S/P St Jude bileaflet mechanical MVR that appears to be functioning normally.  Mitral valve regurgitation cannot assess due to mechanical valve shadowing. MV Mean grad: 7.0 mmHg MV Area (PHT): 3.38 cm (stable for valve).  Mild Ao Sclerosis, Mild-Mod AI  . TRANSTHORACIC ECHOCARDIOGRAM  08/'17; 10/'18    a) Mild conc LVH. EF 55-60%. No RWMA. Mod AI. Mechanical MV prosthesis functioning properly. LAD dilation.;; b)  EF 55-60%.  Mo AI.  Bileaflet Saint Jude mechanical MV with no paravalvular leak.  Severe LA dilation.  Minimally elevated PAP    Allergies  Allergen Reactions  . Flexeril [Cyclobenzaprine] Diarrhea    Allergies as of 10/23/2020      Reactions   Flexeril [cyclobenzaprine] Diarrhea      Medication List       Accurate as of October 23, 2020 10:40 AM. If you have any questions, ask your nurse or doctor.        acetaminophen 325 MG tablet Commonly known as: TYLENOL Take 650 mg by mouth in the morning, at noon, and at bedtime.   bisoprolol 5 MG tablet Commonly known as: ZEBETA Take 1 tablet (5 mg total) by mouth daily.   ciprofloxacin 500 MG tablet Commonly known as: CIPRO Take 500 mg by mouth 2 (two) times daily.   docusate sodium 100 MG capsule Commonly known as: COLACE Take 100 mg by mouth daily as needed for mild constipation.   fluticasone 50 MCG/ACT nasal spray Commonly known as: FLONASE Place 1 spray into both nostrils daily as needed for allergies or rhinitis.   HYDROcodone-acetaminophen 5-325 MG tablet Commonly known as: NORCO/VICODIN Take 1-2 tablets by mouth every 4 (four) hours as needed for moderate pain (pain score 4-6).   isosorbide mononitrate 30 MG 24 hr tablet Commonly known as: IMDUR Take 1 tablet (30 mg total) by mouth daily.   lactose free nutrition Liqd Take 237 mLs by mouth 2 (two) times daily between meals.   LORazepam 1 MG tablet Commonly known as: ATIVAN Take 1 mg by mouth every 8 (eight) hours as needed for anxiety.   methocarbamol 500 MG tablet Commonly known as: ROBAXIN Take 1 tablet (500 mg total) by  mouth every 6 (six) hours as needed for muscle spasms.   mirtazapine 7.5 MG tablet Commonly known as: REMERON Take 15 mg by mouth.   MULTIVITAMIN PO Take 1 tablet by mouth daily.   polyethylene glycol 17 g packet Commonly known as: MIRALAX / GLYCOLAX Take 17 g by mouth daily as needed.   PRESERVISION AREDS 2 PO Take 1 capsule by mouth daily.   tamsulosin 0.4 MG Caps capsule Commonly known as: FLOMAX Take 0.4 mg by mouth at bedtime.   warfarin 4 MG tablet Commonly known as: COUMADIN Take 4 mg by mouth daily.   zinc oxide 20 % ointment Apply 1 application topically as needed for irritation.       Review of Systems  Constitutional: Positive for activity change, appetite change and unexpected weight change.  HENT: Negative.   Respiratory: Negative.   Cardiovascular: Positive for leg  swelling.  Gastrointestinal: Negative for diarrhea.  Genitourinary: Positive for frequency and urgency.  Musculoskeletal: Positive for gait problem.  Skin: Negative.   Neurological: Positive for weakness.  Psychiatric/Behavioral: Negative.   All other systems reviewed and are negative.   Immunization History  Administered Date(s) Administered  . Influenza, High Dose Seasonal PF 04/20/2016  . Influenza,inj,Quad PF,6+ Mos 03/28/2018  . Influenza-Unspecified 03/30/2017  . Moderna Sars-Covid-2 Vaccination 07/02/2019, 07/30/2019, 05/12/2020  . Pneumococcal Conjugate-13 06/10/2014  . Pneumococcal-Unspecified 02/02/2006  . Tdap 06/10/2014  . Zoster 03/29/2015   Pertinent  Health Maintenance Due  Topic Date Due  . INFLUENZA VACCINE  01/26/2021  . PNA vac Low Risk Adult  Completed   No flowsheet data found. Functional Status Survey:    Vitals:   10/23/20 1032  BP: (!) 96/50  Pulse: (!) 102  Resp: 16  Temp: (!) 97.2 F (36.2 C)  SpO2: 96%  Weight: 134 lb 11.2 oz (61.1 kg)  Height: 6' (1.829 m)   Body mass index is 18.27 kg/m. Physical Exam Vitals reviewed.  Constitutional:       Appearance: Normal appearance.  HENT:     Head: Normocephalic.     Nose: Nose normal.     Mouth/Throat:     Mouth: Mucous membranes are moist.     Pharynx: Oropharynx is clear.  Eyes:     Pupils: Pupils are equal, round, and reactive to light.  Cardiovascular:     Rate and Rhythm: Rhythm irregular.     Heart sounds: Murmur heard.    Pulmonary:     Effort: Pulmonary effort is normal.     Breath sounds: Normal breath sounds. No rales.  Abdominal:     General: Abdomen is flat. Bowel sounds are normal.     Palpations: Abdomen is soft.  Musculoskeletal:     Cervical back: Neck supple.     Comments: No Edema Chronic Venous Changes Bilateral  Skin:    General: Skin is warm and dry.  Neurological:     General: No focal deficit present.     Mental Status: He is alert and oriented to person, place, and time.  Psychiatric:        Mood and Affect: Mood normal.        Thought Content: Thought content normal.     Labs reviewed: Recent Labs    08/31/20 0111 09/01/20 0247 09/02/20 0130 09/08/20 0000 09/15/20 0000 09/23/20 0000 10/15/20 0000  NA 133* 133* 134*   < > 131* 131* 136*  K 3.8 3.7 4.0   < > 4.0 4.1 4.0  CL 102 103 104   < > 99 99 102  CO2 24 23 22    < > 23* 24* 23*  GLUCOSE 104* 88 97  --   --   --   --   BUN 26* 27* 25*   < > 23* 33* 53*  CREATININE 1.00 0.99 1.05   < > 0.9 1.0 1.4*  CALCIUM 7.9* 7.9* 8.0*   < > 8.1* 8.6* 8.8  MG 2.4 2.2 2.1  --   --   --   --    < > = values in this interval not displayed.   Recent Labs    08/25/20 1832 08/26/20 0332 09/08/20 0000 09/15/20 0000 09/23/20 0000 10/15/20 0000  AST 39 34   < > 45* 38 122*  ALT 24 21   < > 42* 38 164*  ALKPHOS 54 42   < > 111 93 95  BILITOT 1.1 1.1  --   --   --   --   PROT 5.8* 5.3*  --   --   --   --   ALBUMIN 3.4* 2.9*   < > 3.0* 3.1* 3.3*   < > = values in this interval not displayed.   Recent Labs    08/31/20 0111 09/01/20 0247 09/02/20 0130 09/08/20 0000 09/23/20 0000  10/15/20 0000  WBC 4.4 4.0 4.0 4.9 3.1 6.0  NEUTROABS 3.1  --  2.7 3,729.00 2,062.00  --   HGB 10.4* 10.0* 9.7* 10.8* 10.8* 11.4*  HCT 31.3* 29.1* 28.1* 32* 33* 35*  MCV 93.4 92.4 92.7  --   --   --   PLT 122* 144* 164 366 183 248   Lab Results  Component Value Date   TSH 2.39 10/15/2020   No results found for: HGBA1C No results found for: CHOL, HDL, LDLCALC, LDLDIRECT, TRIG, CHOLHDL  Significant Diagnostic Results in last 30 days:  No results found.  Assessment/Plan  Acute cystitis with hematuria On Cipro  Symptoms resolves Renal US showed Mild Hydronephrosis with No Renal [facility] Follow up arranged  On Flomax Weight loss Renal and Abdominal US was negative for any Acute issues Is on Remeron Off his lasix Working with Dietary for Food Choices If Continues to loose weight possible GI Referal Longstanding persistent atrial fibrillation: CHA2DS2-VASc Score 3 On Coumadin Discontinue Zebeta due to Low BP  Also decrase his Imdur to 15 mg at this time Essential hypertension BP running low Discontiue Zebeta Change imdur to 15 mg  S/P MVR (mitral valve replacement) On Coumadin LE Edema Off Lasix BUN and creat are Better now No edema Noticed today Loose stools Resolved per Patient  Elevated LFTs Repeat Labs today showed AST and ALT have come down 50 and 96 RUQ [place] was negative for any Acute issues  Pressure ulcer of right buttock, stage 2 (HCC)  Closed displaced fracture of distal epiphysis of left femur with routine healing, subsequent encounter Doing well. No Pain Walking with therapy Tylenol made Prn and Norco Discontineud  ACP Discussed in detail with his son.       Family/ staff Communication:   Labs/tests ordered:   Total time spent in this patient care encounter was  45_  minutes; greater than 50% of the visit spent counseling patient and staff, reviewing records , Labs and coordinating care for problems addressed at this encounter.

## 2020-10-23 NOTE — Telephone Encounter (Signed)
Pt c/o BP issue: STAT if pt c/o blurred vision, one-sided weakness or slurred speech  1. What are your last 5 BP readings? States that his systolic BP has not gone over 120. Could not give me any specific readings.   2. Are you having any other symptoms (ex. Dizziness, headache, blurred vision, passed out)? Exhausted, 12 pound weight loss and blurred vision.   3. What is your BP issue? Low BP   Patient's wife states that the pt broke his left femur at the end of February and has not been able to fully recover since. He lives at [facility] and is currently in [place] for his broken femur. They are checking his coumadin out there per wife.

## 2020-10-23 NOTE — Telephone Encounter (Signed)
Patient of Dr. Ellyn Hack (wife called in) Patient is at [facility] - broken femur in Feb - cannot particpate in PT well b/c of low BP, exhaustion Wife states he is not progressing well, not getting better She reports he has had a 12lb weight loss and low BPs She reports BPs under 100s/50s She said he has no energy, feelings of exhaustion that are associated with some visual disturbances BP is low today at Dr. Lyndel Safe appointment - note in Epic Patient is on cipro for a UTI  Wife is requesting Dr. Ellyn Hack review notes and advise if any changes should be made She is requesting an appointment between May 12 - 20 (DOD appt on 5/19) but nothing else in this time frame

## 2020-10-24 NOTE — Telephone Encounter (Signed)
Message sent to [address].

## 2020-10-24 NOTE — Telephone Encounter (Signed)
    Pt's wife is calling back to follow up, she is hoping to get Dr. Allison Quarry recommendations today. She gave her cell# [phone]

## 2020-10-29 NOTE — Telephone Encounter (Signed)
So he is not on any blood pressure medications.  The other thing he is on that may affect blood pressure is Imdur which she is more than welcome to stop for now.  I think some of his blood pressures were low but probably because of some failure to thrive and potentially his UTI.  Would probably follow-up with PCP.  Glenetta Hew, MD

## 2020-10-30 NOTE — Telephone Encounter (Signed)
Spoke to patient's wife she stated husband's B/P has improved.Stated Dr.Gupta at [facility] stopped one of his medications and B/P has came back up.

## 2020-11-07 ENCOUNTER — Other Ambulatory Visit: Payer: Self-pay | Admitting: Orthopedic Surgery

## 2020-11-07 DIAGNOSIS — F419 Anxiety disorder, unspecified: Secondary | ICD-10-CM

## 2020-11-07 MED ORDER — LORAZEPAM 1 MG PO TABS: 1.0000 mg | ORAL_TABLET | Freq: Three times a day (TID) | ORAL | 0 refills | Status: AC | PRN

## 2020-11-12 ENCOUNTER — Non-Acute Institutional Stay (SKILLED_NURSING_FACILITY): Payer: Medicare Other | Admitting: Orthopedic Surgery

## 2020-11-12 ENCOUNTER — Encounter: Payer: Self-pay | Admitting: Orthopedic Surgery

## 2020-11-12 DIAGNOSIS — R634 Abnormal weight loss: Secondary | ICD-10-CM

## 2020-11-12 DIAGNOSIS — K5901 Slow transit constipation: Secondary | ICD-10-CM | POA: Diagnosis not present

## 2020-11-12 DIAGNOSIS — Z87440 Personal history of urinary (tract) infections: Secondary | ICD-10-CM

## 2020-11-12 DIAGNOSIS — I10 Essential (primary) hypertension: Secondary | ICD-10-CM

## 2020-11-12 DIAGNOSIS — F419 Anxiety disorder, unspecified: Secondary | ICD-10-CM

## 2020-11-12 DIAGNOSIS — C44702 Unspecified malignant neoplasm of skin of right lower limb, including hip: Secondary | ICD-10-CM

## 2020-11-12 DIAGNOSIS — S72442D Displaced fracture of lower epiphysis (separation) of left femur, subsequent encounter for closed fracture with routine healing: Secondary | ICD-10-CM | POA: Diagnosis not present

## 2020-11-12 DIAGNOSIS — Z952 Presence of prosthetic heart valve: Secondary | ICD-10-CM | POA: Diagnosis not present

## 2020-11-12 DIAGNOSIS — I4811 Longstanding persistent atrial fibrillation: Secondary | ICD-10-CM | POA: Diagnosis not present

## 2020-11-12 DIAGNOSIS — N400 Enlarged prostate without lower urinary tract symptoms: Secondary | ICD-10-CM | POA: Diagnosis not present

## 2020-11-12 NOTE — Progress Notes (Signed)
Location:   [place] Room Number: [place] of Service:  SNF [phone]) Provider:  Windell Moulding, NP    Patient Care Team: Virgie Dad, MD as PCP - General (Internal Medicine)  Extended Emergency Contact Information Primary Emergency Contact: [place], [place] Mobile Phone: [phone] Relation: Son Secondary Emergency Contact: [facility] Address: [address]          [place] Home Phone: [phone] Relation: Spouse  Code Status:  DNR Goals of care: Advanced Directive information Advanced Directives 11/12/2020  Does Patient Have a Medical Advance Directive? -  Type of Advance Directive Living will;Out of facility DNR (pink MOST or yellow form)  Does patient want to make changes to medical advance directive? -  Copy of [place] in Chart? -  Would patient like information on creating a medical advance directive? -  Pre-existing out of facility DNR order (yellow form or pink MOST form) Yellow form placed in chart (order not valid for inpatient use);Pink MOST form placed in chart (order not valid for inpatient use)     Chief Complaint  Patient presents with  . Medical Management of Chronic Issues    Routine follow up.     HPI:  Pt is a 87 y.o. male seen today for medical management of chronic diseases.    He resides on the skilled nursing unit at [facility]. Past medical history includes: aortic insufficiency, hypertension, atrial fibrillation, cardiomyopathy, skin cancer of leg, left femur fracture, BPH, hyperlipidemia and weight loss.   Left femur incision healed. Continues to improve his mobility with OT/PT. Ambulating about 50 ft with walker, touching assistance. Minimal assist with ADLs. Uses wheelchair about 3 hours daily. Denies left hip pain. Scheduled to see Dr. Lyla Glassing 11/24/2020. No recent falls or injuries.   PAF- takes coumadin, bisoprolol discontinues for low bp MVR- goal INR 2.5-3.5, takes  coumadin HTN- takes Imdur, furosemide and bisoprolol d/c for low bp Constipation- last BM today. Colace and miralax prn. Appetite- Remeron 15mg  started last month. Weight improving. Daily Boost supplement BPH- no retention, on flomax Anxiety- no panic attacks, cont prn ativan Right leg skin cancer- missed last f/u with dermatology due to fracture, will ask wife about f/u. Denies pain, some drainage present.   Recent weights:  05/18- 144.6 lbs  04/15- 133.4 lbs  03/11- 159.1 lbs  Recent blood pressures:  05/17- 148/75  05/10- 132/71  05/03- 139/67  04/29- 121/51  Nurse does not report any concerns, vitals stable.         Past Medical History:  Diagnosis Date  . Anemia    leakoppenia  . BPH (benign prostatic hypertrophy)   . Bullous pemphigoid    [name], March 2011, right forearm squamous cell carcinoma  . Chronic anticoagulation    systemic  . Colon polyp    transverse, 2002  . History of peptic ulcer    remote, 3/95  . Hx of actinic keratosis   . Hx of basal cell carcinoma   . Hx of squamous cell carcinoma of skin   . Hyperlipidemia   . Left inguinal hernia   . Moderate aortic insufficiency 2009   audible aortic insufficiency on 1/09 echo  . PAF (paroxysmal atrial fibrillation) ([place]) 01/17/2014   On Warfarin.  . S/P mitral valve replacement with metallic valve 32/2025   INR goal 2.5-3.5, St Jude,   . 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 Electrodesiccation and Curettage and Shave Biopsy Right    Right medial, anterio tibia: Well differentiated Squamous Cell  . hip replacements Left    10 years ago  . MITRAL VALVE REPLACEMENT  03/1996   St. Jude mechanical valve  . ORIF FEMUR FRACTURE Left 08/28/2020   Procedure: OPEN REDUCTION INTERNAL FIXATION (ORIF) DISTAL FEMUR FRACTURE;  Surgeon: Rod Can, MD;  Location: [place];  Service: Orthopedics;  Laterality: Left;   . TOTAL HIP ARTHROPLASTY Right 10/12/2017   Procedure: RIGHT TOTAL HIP ARTHROPLASTY ANTERIOR APPROACH;  Surgeon: Gaynelle Arabian, MD;  Location: [facility];  Service: Orthopedics;  Laterality: Right;  . TRANSTHORACIC ECHOCARDIOGRAM  12/2018   Unable to assess diastolic function because of A. fib. Normal RV function, but moderately elevated RVSP.  Severe biatrial enlargement. S/P St Jude bileaflet mechanical MVR that appears to be functioning normally. Mitral valve regurgitation cannot assess due to mechanical valve shadowing. MV Mean grad: 7.0 mmHg MV Area (PHT): 3.38 cm (stable for valve).  Mild Ao Sclerosis, Mild-Mod AI  . TRANSTHORACIC ECHOCARDIOGRAM  08/'17; 10/'18    a) Mild conc LVH. EF 55-60%. No RWMA. Mod AI. Mechanical MV prosthesis functioning properly. LAD dilation.;; b)  EF 55-60%.  Mo AI.  Bileaflet Saint Jude mechanical MV with no paravalvular leak.  Severe LA dilation.  Minimally elevated PAP    Allergies  Allergen Reactions  . Flexeril [Cyclobenzaprine] Diarrhea    Allergies as of 11/12/2020      Reactions   Flexeril [cyclobenzaprine] Diarrhea      Medication List       Accurate as of Nov 12, 2020  9:27 AM. If you have any questions, ask your nurse or doctor.        STOP taking these medications   methocarbamol 500 MG tablet Commonly known as: ROBAXIN Stopped by: Yvonna Alanis, NP     TAKE these medications   acetaminophen 325 MG tablet Commonly known as: TYLENOL Take 650 mg by mouth 3 (three) times daily as needed.   docusate sodium 100 MG capsule Commonly known as: COLACE Take 100 mg by mouth daily as needed for mild constipation.   fluticasone 50 MCG/ACT nasal spray Commonly known as: FLONASE Place 1 spray into both nostrils daily as needed for allergies or rhinitis.   isosorbide mononitrate 30 MG 24 hr tablet Commonly known as: IMDUR Take 15 mg by mouth daily. What changed: Another medication with the same name was removed. Continue taking this medication,  and follow the directions you see here. Changed by: Yvonna Alanis, NP   lactose free nutrition Liqd Take 237 mLs by mouth 2 (two) times daily between meals.   LORazepam 1 MG tablet Commonly known as: ATIVAN Take 1 mg by mouth daily as needed for anxiety.   LORazepam 1 MG tablet Commonly known as: ATIVAN Take 1 tablet (1 mg total) by mouth every 8 (eight) hours as needed for anxiety.   mirtazapine 7.5 MG tablet Commonly known as: REMERON Take 15 mg by mouth.   MULTIVITAMIN PO Take 1 tablet by mouth daily.   polyethylene glycol 17 g packet Commonly known as: MIRALAX / GLYCOLAX Take 17 g by mouth daily as needed.   PRESERVISION AREDS 2 PO Take 1 capsule by mouth daily.   tamsulosin 0.4 MG Caps capsule Commonly known as: FLOMAX Take 0.4 mg by mouth at bedtime.   warfarin 4 MG tablet Commonly known as: COUMADIN Take 4 mg by  mouth daily.   warfarin 1 MG tablet Commonly known as: COUMADIN Take 1 mg by mouth once a week. On WED   warfarin 3 MG tablet Commonly known as: COUMADIN Take 3 mg by mouth once a week. On WED   zinc oxide 20 % ointment Apply 1 application topically as needed for irritation.       Review of Systems  Constitutional: Negative.   HENT: Negative.   Eyes: Negative.   Respiratory: Negative for cough, shortness of breath and wheezing.   Cardiovascular: Negative for chest pain and leg swelling.  Gastrointestinal: Negative.   Genitourinary: Negative for dysuria, frequency and hematuria.  Musculoskeletal: Negative for arthralgias and myalgias.  Skin: Negative.   Neurological: Positive for weakness. Negative for dizziness and headaches.  Psychiatric/Behavioral: Negative for dysphoric mood and sleep disturbance. The patient is not nervous/anxious.     Immunization History  Administered Date(s) Administered  . Influenza, High Dose Seasonal PF 04/20/2016  . Influenza,inj,Quad PF,6+ Mos 03/28/2018  . Influenza-Unspecified 03/30/2017  . Moderna  Sars-Covid-2 Vaccination 07/02/2019, 07/30/2019, 05/12/2020  . Pneumococcal Conjugate-13 06/10/2014  . Pneumococcal-Unspecified 02/02/2006  . Tdap 06/10/2014  . Zoster 03/29/2015   Pertinent  Health Maintenance Due  Topic Date Due  . INFLUENZA VACCINE  01/26/2021  . PNA vac Low Risk Adult  Completed   No flowsheet data found. Functional Status Survey:    Vitals:   11/12/20 0915  BP: (!) 148/75  Pulse: 97  Resp: 16  Temp: 98.1 F (36.7 C)  SpO2: 93%  Weight: 145 lb 12.8 oz (66.1 kg)  Height: 6' (1.829 m)   Body mass index is 19.77 kg/m. Physical Exam Vitals reviewed.  Constitutional:      General: He is not in acute distress. HENT:     Head: Normocephalic.     Right Ear: There is no impacted cerumen.     Left Ear: There is no impacted cerumen.     Nose: Nose normal.     Mouth/Throat:     Mouth: Mucous membranes are moist.  Eyes:     General:        Right eye: No discharge.        Left eye: No discharge.     Comments: glasses  Neck:     Thyroid: No thyroid mass or thyromegaly.     Vascular: No carotid bruit.  Cardiovascular:     Rate and Rhythm: Normal rate. Rhythm irregular.     Pulses: Normal pulses.     Heart sounds: Normal heart sounds. No murmur heard.   Pulmonary:     Effort: Pulmonary effort is normal. No respiratory distress.     Breath sounds: Normal breath sounds. No wheezing.  Abdominal:     General: Bowel sounds are normal. There is no distension.     Palpations: Abdomen is soft.     Tenderness: There is no abdominal tenderness.  Musculoskeletal:     Cervical back: Normal range of motion.     Right lower leg: No edema.     Left lower leg: No edema.  Lymphadenopathy:     Cervical: No cervical adenopathy.  Skin:    General: Skin is warm and dry.     Capillary Refill: Capillary refill takes less than 2 seconds.     Comments: Pea-sized growth to right shin. Shape irregular, skin brown/black with serosanguinous drainage. No sign of infection.    Neurological:     General: No focal deficit present.     Mental Status:  He is alert and oriented to person, place, and time.  Psychiatric:        Mood and Affect: Mood normal.        Behavior: Behavior normal.     Labs reviewed: Recent Labs    08/31/20 0111 09/01/20 0247 09/02/20 0130 09/08/20 0000 09/23/20 0000 10/15/20 0000 10/23/20 1035  NA 133* 133* 134*   < > 131* 136* 134*  K 3.8 3.7 4.0   < > 4.1 4.0 4.6  CL 102 103 104   < > 99 102 104  CO2 24 23 22    < > 24* 23* 23*  GLUCOSE 104* 88 97  --   --   --   --   BUN 26* 27* 25*   < > 33* 53* 33*  CREATININE 1.00 0.99 1.05   < > 1.0 1.4* 1.2  CALCIUM 7.9* 7.9* 8.0*   < > 8.6* 8.8 8.6*  MG 2.4 2.2 2.1  --   --   --   --    < > = values in this interval not displayed.   Recent Labs    08/25/20 1832 08/26/20 0332 09/08/20 0000 09/23/20 0000 10/15/20 0000 10/23/20 1035  AST 39 34   < > 38 122* 50*  ALT 24 21   < > 38 164* 96*  ALKPHOS 54 42   < > 93 95 77  BILITOT 1.1 1.1  --   --   --   --   PROT 5.8* 5.3*  --   --   --   --   ALBUMIN 3.4* 2.9*   < > 3.1* 3.3* 5.7*   < > = values in this interval not displayed.   Recent Labs    08/31/20 0111 09/01/20 0247 09/02/20 0130 09/08/20 0000 09/23/20 0000 10/15/20 0000  WBC 4.4 4.0 4.0 4.9 3.1 6.0  NEUTROABS 3.1  --  2.7 3,729.00 2,062.00  --   HGB 10.4* 10.0* 9.7* 10.8* 10.8* 11.4*  HCT 31.3* 29.1* 28.1* 32* 33* 35*  MCV 93.4 92.4 92.7  --   --   --   PLT 122* 144* 164 366 183 248   Lab Results  Component Value Date   TSH 2.39 10/15/2020   No results found for: HGBA1C No results found for: CHOL, HDL, LDLCALC, LDLDIRECT, TRIG, CHOLHDL  Significant Diagnostic Results in last 30 days:  No results found.  Assessment/Plan 1. Closed displaced fracture of distal epiphysis of left femur with routine healing, subsequent encounter - followed by Dr. Lyla Glassing- scheduled 05/30 - ambulating about 50 ft, minimal assist - no pain - on coumdain  2. Longstanding  persistent atrial fibrillation: CHA2DS2-VASc Score 3 - rate controlled - cont coumain  3. S/P MVR (mitral valve replacement) - goal INR 2.5-3.5 - cont coumain  4. Essential hypertension - furosemide and bisoprolol d/c d/t low bp - cont imdur  5. Weight loss - improving - cont Remeron 15 mg daily - cont Boost supplements - recommend eating high fat calorie foods and sweets to help gain weight  6. Anxiety - stable with prn ativan, no recent panic attacks - he has taken prn dose last 4 days - will change ativan to 1 mg po daily prn x 14 days  7. Slow transit constipation - stable with prn colace and miralax  8. Benign prostatic hyperplasia without lower urinary tract symptoms - stable with flomax  9. Cancer of skin of right lower leg - followed by dermatology, last appointment cancelled -  recommend rescheduling f/u   10. History of cystitis - given cipro last month- resolved - Renal US with mild hydronephrosis/ no renal stones - f/u with urology 05/26    Family/ staff Communication: plan discussed with patient and nurse  Labs/tests ordered:  none

## 2020-11-13 ENCOUNTER — Non-Acute Institutional Stay (SKILLED_NURSING_FACILITY): Payer: Medicare Other | Admitting: Internal Medicine

## 2020-11-13 DIAGNOSIS — Z952 Presence of prosthetic heart valve: Secondary | ICD-10-CM

## 2020-11-13 DIAGNOSIS — I4811 Longstanding persistent atrial fibrillation: Secondary | ICD-10-CM | POA: Diagnosis not present

## 2020-11-13 DIAGNOSIS — L89312 Pressure ulcer of right buttock, stage 2: Secondary | ICD-10-CM

## 2020-11-13 DIAGNOSIS — S72442D Displaced fracture of lower epiphysis (separation) of left femur, subsequent encounter for closed fracture with routine healing: Secondary | ICD-10-CM

## 2020-11-13 DIAGNOSIS — B3789 Other sites of candidiasis: Secondary | ICD-10-CM

## 2020-11-13 DIAGNOSIS — F419 Anxiety disorder, unspecified: Secondary | ICD-10-CM | POA: Diagnosis not present

## 2020-11-13 DIAGNOSIS — I10 Essential (primary) hypertension: Secondary | ICD-10-CM | POA: Diagnosis not present

## 2020-11-13 DIAGNOSIS — N400 Enlarged prostate without lower urinary tract symptoms: Secondary | ICD-10-CM | POA: Diagnosis not present

## 2020-11-13 DIAGNOSIS — R634 Abnormal weight loss: Secondary | ICD-10-CM

## 2020-11-14 ENCOUNTER — Encounter: Payer: Self-pay | Admitting: Internal Medicine

## 2020-11-14 NOTE — Progress Notes (Signed)
Location: [employer] [facility] of Service:  SNF (31)  Provider:   Code Status: Full Code Goals of Care:  Advanced Directives 11/12/2020  Does Patient Have a Medical Advance Directive? -  Type of Advance Directive Living will;Out of facility DNR (pink MOST or yellow form)  Does patient want to make changes to medical advance directive? -  Copy of [place] in Chart? -  Would patient like information on creating a medical advance directive? -  Pre-existing out of facility DNR order (yellow form or pink MOST form) Yellow form placed in chart (order not valid for inpatient use);Pink MOST form placed in chart (order not valid for inpatient use)     Chief Complaint  Patient presents with  . Acute Visit    HPI: Patient is a 87 y.o. male seen today for an acute visit for Rash in Groin area and Follow up for Pressure wound Admitted in thehospital from 2/28-3/8Closed Periprostheticdisplaced fracture of distal epiphysis of left femur Patient has a history of PAF, mechanical mitral valve is 97 on Coumadin, BPH, previous history of right and left hip arthroplasty, hypertension.  Evaluated rash in groin area and Pressure wound in sacral area He is doing well. Has gained weight Eating better resting better. Walking with mild assist No Pain any where  Past Medical History:  Diagnosis Date  . Anemia    leakoppenia  . BPH (benign prostatic hypertrophy)   . Bullous pemphigoid    [name], March 2011, right forearm squamous cell carcinoma  . Chronic anticoagulation    systemic  . Colon polyp    transverse, 2002  . History of peptic ulcer    remote, 3/95  . Hx of actinic keratosis   . Hx of basal cell carcinoma   . Hx of squamous cell carcinoma of skin   . Hyperlipidemia   . Left inguinal hernia   . Moderate aortic insufficiency 2009   audible aortic insufficiency on 1/09 echo  . PAF (paroxysmal atrial fibrillation) ([place]) 01/17/2014   On Warfarin.  . S/P  mitral valve replacement with metallic valve 81/4481   INR goal 2.5-3.5, St Jude,   . 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 Electrodesiccation and Curettage and Shave Biopsy Right    Right medial, anterio tibia: Well differentiated Squamous Cell  . hip replacements Left    10 years ago  . MITRAL VALVE REPLACEMENT  03/1996   St. Jude mechanical valve  . ORIF FEMUR FRACTURE Left 08/28/2020   Procedure: OPEN REDUCTION INTERNAL FIXATION (ORIF) DISTAL FEMUR FRACTURE;  Surgeon: Rod Can, MD;  Location: [place];  Service: Orthopedics;  Laterality: Left;  . TOTAL HIP ARTHROPLASTY Right 10/12/2017   Procedure: RIGHT TOTAL HIP ARTHROPLASTY ANTERIOR APPROACH;  Surgeon: Gaynelle Arabian, MD;  Location: [facility];  Service: Orthopedics;  Laterality: Right;  . TRANSTHORACIC ECHOCARDIOGRAM  12/2018   Unable to assess diastolic function because of A. fib. Normal RV function, but moderately elevated RVSP.  Severe biatrial enlargement. S/P St Jude bileaflet mechanical MVR that appears to be functioning normally. Mitral valve regurgitation cannot assess due to mechanical valve shadowing. MV Mean grad: 7.0 mmHg MV Area (PHT): 3.38 cm (stable for valve).  Mild Ao Sclerosis, Mild-Mod AI  . TRANSTHORACIC ECHOCARDIOGRAM  08/'17; 10/'18    a) Mild conc LVH. EF 55-60%. No RWMA. Mod AI. Mechanical MV  prosthesis functioning properly. LAD dilation.;; b)  EF 55-60%.  Mo AI.  Bileaflet Saint Jude mechanical MV with no paravalvular leak.  Severe LA dilation.  Minimally elevated PAP    Allergies  Allergen Reactions  . Flexeril [Cyclobenzaprine] Diarrhea    Outpatient Encounter Medications as of 11/13/2020  Medication Sig  . acetaminophen (TYLENOL) 325 MG tablet Take 650 mg by mouth 3 (three) times daily as needed.  . docusate sodium (COLACE) 100 MG capsule Take 100 mg by mouth daily as needed for mild constipation.  .  fluticasone (FLONASE) 50 MCG/ACT nasal spray Place 1 spray into both nostrils daily as needed for allergies or rhinitis.  Marland Kitchen isosorbide mononitrate (IMDUR) 30 MG 24 hr tablet Take 15 mg by mouth daily.  Marland Kitchen lactose free nutrition (BOOST) LIQD Take 237 mLs by mouth 2 (two) times daily between meals.  Marland Kitchen LORazepam (ATIVAN) 1 MG tablet Take 1 tablet (1 mg total) by mouth every 8 (eight) hours as needed for anxiety.  Marland Kitchen LORazepam (ATIVAN) 1 MG tablet Take 1 mg by mouth daily as needed for anxiety.  . mirtazapine (REMERON) 7.5 MG tablet Take 15 mg by mouth.  . Multiple Vitamins-Minerals (MULTIVITAMIN PO) Take 1 tablet by mouth daily.  . Multiple Vitamins-Minerals (PRESERVISION AREDS 2 PO) Take 1 capsule by mouth daily.  . polyethylene glycol (MIRALAX / GLYCOLAX) 17 g packet Take 17 g by mouth daily as needed.  . tamsulosin (FLOMAX) 0.4 MG CAPS capsule Take 0.4 mg by mouth at bedtime.  Marland Kitchen warfarin (COUMADIN) 1 MG tablet Take 1 mg by mouth once a week. On WED  . warfarin (COUMADIN) 3 MG tablet Take 3 mg by mouth once a week. On WED  . warfarin (COUMADIN) 4 MG tablet Take 4 mg by mouth daily.  Marland Kitchen zinc oxide 20 % ointment Apply 1 application topically as needed for irritation.   No facility-administered encounter medications on file as of 11/13/2020.    Review of Systems:  Review of Systems  Constitutional: Negative.   HENT: Negative.   Respiratory: Negative.   Cardiovascular: Positive for leg swelling.  Gastrointestinal: Negative.   Musculoskeletal: Positive for arthralgias and gait problem.  Skin: Positive for rash.  Neurological: Positive for weakness.  Psychiatric/Behavioral: The patient is nervous/anxious.     Health Maintenance  Topic Date Due  . COVID-19 Vaccine (4 - Booster for Moderna series) 08/12/2020  . INFLUENZA VACCINE  01/26/2021  . TETANUS/TDAP  06/10/2024  . PNA vac Low Risk Adult  Completed  . HPV VACCINES  Aged Out    Physical Exam: Vitals:   11/14/20 0637  BP: (!) 148/75   Pulse: 97  Resp: 16  Temp: (!) 97.4 F (36.3 C)  Weight: 144 lb 9.6 oz (65.6 kg)   Body mass index is 19.61 kg/m. Physical Exam  Constitutional: Oriented to person, place, and time. Well-developed .  HENT:  Head: Normocephalic.  Mouth/Throat: Oropharynx is clear and moist.  Eyes: Pupils are equal, round, and reactive to light.  Neck: Neck supple.  Cardiovascular: Normal rate and normal heart sounds.  Murmur Present Pulmonary/Chest: Effort normal and breath sounds normal. No respiratory distress. No wheezes. Has no rales.  Abdominal: Soft. Bowel sounds are normal. No distension. There is no tenderness. There is no rebound.  Musculoskeletal: Has Edema with Chronic Venous Changes Bilateral  Lymphadenopathy: none Neurological: Alert and oriented to person, place, and time.  Skin: Skin is warm and dry. Has Excoriated rash in his Groin Area and Inner thigh Area Pressure  wound in his Right Sacral area Stage 2 And Stage 1 in Left sacral area Psychiatric: Normal mood and affect. Behavior is normal. Thought content normal.    Labs reviewed: Basic Metabolic Panel: Recent Labs    08/31/20 0111 09/01/20 0247 09/02/20 0130 09/08/20 0000 09/23/20 0000 10/15/20 0000 10/23/20 1035  NA 133* 133* 134*   < > 131* 136* 134*  K 3.8 3.7 4.0   < > 4.1 4.0 4.6  CL 102 103 104   < > 99 102 104  CO2 24 23 22    < > 24* 23* 23*  GLUCOSE 104* 88 97  --   --   --   --   BUN 26* 27* 25*   < > 33* 53* 33*  CREATININE 1.00 0.99 1.05   < > 1.0 1.4* 1.2  CALCIUM 7.9* 7.9* 8.0*   < > 8.6* 8.8 8.6*  MG 2.4 2.2 2.1  --   --   --   --   TSH  --   --   --   --   --  2.39  --    < > = values in this interval not displayed.   Liver Function Tests: Recent Labs    08/25/20 1832 08/26/20 0332 09/08/20 0000 09/23/20 0000 10/15/20 0000 10/23/20 1035  AST 39 34   < > 38 122* 50*  ALT 24 21   < > 38 164* 96*  ALKPHOS 54 42   < > 93 95 77  BILITOT 1.1 1.1  --   --   --   --   PROT 5.8* 5.3*  --   --    --   --   ALBUMIN 3.4* 2.9*   < > 3.1* 3.3* 5.7*   < > = values in this interval not displayed.   No results for input(s): LIPASE, AMYLASE in the last 8760 hours. No results for input(s): AMMONIA in the last 8760 hours. CBC: Recent Labs    08/31/20 0111 09/01/20 0247 09/02/20 0130 09/08/20 0000 09/23/20 0000 10/15/20 0000  WBC 4.4 4.0 4.0 4.9 3.1 6.0  NEUTROABS 3.1  --  2.7 3,729.00 2,062.00  --   HGB 10.4* 10.0* 9.7* 10.8* 10.8* 11.4*  HCT 31.3* 29.1* 28.1* 32* 33* 35*  MCV 93.4 92.4 92.7  --   --   --   PLT 122* 144* 164 366 183 248   Lipid Panel: No results for input(s): CHOL, HDL, LDLCALC, TRIG, CHOLHDL, LDLDIRECT in the last 8760 hours. No results found for: HGBA1C  Procedures since last visit: No results found.  Assessment/Plan 1. Pressure ulcer of right buttock, stage 2 (HCC) Xeroform cover with Foam dressing Air mattress and Keep Pressure Off the wound Zinc For Left Excoriation 2. Candida rash of groin Diflucan 100 mg Qd for 3 days Continue Nystatin  3. Closed displaced fracture of distal epiphysis of left femur with routine healing, subsequent encounter Pain Controlled Doing well with therapy  Walking with Mild Assist  4. Longstanding persistent atrial fibrillation: CHA2DS2-VASc Score 3 On Coumadin Off Zebeta due to Low BP  5. S/P MVR (mitral valve replacement) On COumadin  6. Essential hypertension Off all his Meds as he was having low BP  7. Weight loss On Remeron Working with Dietary Has gained some Weight  8. Anxiety On Ativan   9. Benign prostatic hyperplasia without lower urinary tract symptoms Now on Flomax Has to Follow with Urology Renal US showed Mild Hydronephrosis with No Renal Stone  Elevated LFTs Repeat Labs showed AST and ALT have come down 50 and 96 RUQ [place] was negative for any Acute issues   Labs/tests ordered:  * No order type specified * Next appt:  Visit date not found

## 2020-11-20 DIAGNOSIS — N130 Hydronephrosis with ureteropelvic junction obstruction: Secondary | ICD-10-CM | POA: Diagnosis not present

## 2020-11-20 DIAGNOSIS — R338 Other retention of urine: Secondary | ICD-10-CM | POA: Diagnosis not present

## 2020-11-28 DIAGNOSIS — S72032D Displaced midcervical fracture of left femur, subsequent encounter for closed fracture with routine healing: Secondary | ICD-10-CM | POA: Diagnosis not present

## 2020-12-03 DIAGNOSIS — Z23 Encounter for immunization: Secondary | ICD-10-CM | POA: Diagnosis not present

## 2020-12-05 DIAGNOSIS — Q6689 Other  specified congenital deformities of feet: Secondary | ICD-10-CM | POA: Diagnosis not present

## 2020-12-05 DIAGNOSIS — L602 Onychogryphosis: Secondary | ICD-10-CM | POA: Diagnosis not present

## 2020-12-15 DIAGNOSIS — S32502A Unspecified fracture of left pubis, initial encounter for closed fracture: Secondary | ICD-10-CM | POA: Diagnosis not present

## 2020-12-15 DIAGNOSIS — N1330 Unspecified hydronephrosis: Secondary | ICD-10-CM | POA: Diagnosis not present

## 2020-12-15 DIAGNOSIS — K4090 Unilateral inguinal hernia, without obstruction or gangrene, not specified as recurrent: Secondary | ICD-10-CM | POA: Diagnosis not present

## 2020-12-15 DIAGNOSIS — R339 Retention of urine, unspecified: Secondary | ICD-10-CM | POA: Diagnosis not present

## 2020-12-17 ENCOUNTER — Non-Acute Institutional Stay (SKILLED_NURSING_FACILITY): Payer: Medicare Other | Admitting: Orthopedic Surgery

## 2020-12-17 ENCOUNTER — Encounter: Payer: Self-pay | Admitting: Orthopedic Surgery

## 2020-12-17 DIAGNOSIS — K5901 Slow transit constipation: Secondary | ICD-10-CM | POA: Diagnosis not present

## 2020-12-17 DIAGNOSIS — C44702 Unspecified malignant neoplasm of skin of right lower limb, including hip: Secondary | ICD-10-CM

## 2020-12-17 DIAGNOSIS — R269 Unspecified abnormalities of gait and mobility: Secondary | ICD-10-CM | POA: Diagnosis not present

## 2020-12-17 DIAGNOSIS — I4811 Longstanding persistent atrial fibrillation: Secondary | ICD-10-CM

## 2020-12-17 DIAGNOSIS — N400 Enlarged prostate without lower urinary tract symptoms: Secondary | ICD-10-CM

## 2020-12-17 DIAGNOSIS — F419 Anxiety disorder, unspecified: Secondary | ICD-10-CM | POA: Diagnosis not present

## 2020-12-17 DIAGNOSIS — I10 Essential (primary) hypertension: Secondary | ICD-10-CM

## 2020-12-17 DIAGNOSIS — R634 Abnormal weight loss: Secondary | ICD-10-CM

## 2020-12-17 DIAGNOSIS — Z952 Presence of prosthetic heart valve: Secondary | ICD-10-CM

## 2020-12-17 NOTE — Progress Notes (Signed)
Location:  [place] Room Number: 29 Place of Service:  SNF [phone]) Provider:  Windell Moulding, AGNP-C  Virgie Dad, MD  Patient Care Team: Virgie Dad, MD as PCP - General (Internal Medicine)  Extended Emergency Contact Information Primary Emergency Contact: Wei, [place] Mobile Phone: [phone] Relation: Son Secondary Emergency Contact: [facility] Address: [address]          [place]  [place] Home Phone: [phone] Relation: Spouse  Code Status:  Full code Goals of care: Advanced Directive information Advanced Directives 11/12/2020  Does Patient Have a Medical Advance Directive? -  Type of Advance Directive Living will;Out of facility DNR (pink MOST or yellow form)  Does patient want to make changes to medical advance directive? -  Copy of [place] in Chart? -  Would patient like information on creating a medical advance directive? -  Pre-existing out of facility DNR order (yellow form or pink MOST form) Yellow form placed in chart (order not valid for inpatient use);Pink MOST form placed in chart (order not valid for inpatient use)     Chief Complaint  Patient presents with   Medical Management of Chronic Issues    HPI:  Pt is a 87 y.o. male seen today for medical management of chronic diseases.    He currently resides on the skilled nursing unit at [facility]. Past medical history includes: aortic insufficiency, HTN, atrial fibrillation, cardiomyopathy, constipation, BPH, hyperlipidemia, recent femur fracture and weight loss.   Left femur fracture- f/u with Dr. Lyla Glassing 06/03, incision healed, continues to work with PT/OT. Ambulates > 200 ft with walker. Denies pain. No recent injuries or falls.  Atrial fibrillation- Rate controlled without medication. PT/INR 3.2 06/17, advised to continue current coumadin regimen and recheck PT/INR in 3 weeks.  MVR- goal INR 2.5-3.5, cont current coumadin  regimen.  HTN-  Imdur decreased last month, BUN/creat 33/1.2 10/23/2020 Constipation- last BM today, colace and miralax prn BPH- followed by [facility]/Dr. Gloriann Loan, patient reports having CT done 06/21, plans to discuss results 06/23. Denies retention, on Flomax.  Anxiety- no recent panic attacks, ativan 1 mg daily prn Weight loss- reports poor appetite, but he is making himself eat, family will bring in snacks, Remeron 15 mg qhs  He still plans to return home after PT/OT complete.   Recent blood pressures:  06/14- 124/53  06/08- 125/65  06/07-  147/68  Recent weights:  06/22- 149.2 lbs  05/25- 149.2 lbs  04/20- 155.5 lbs  Admission weight- 155.5 lbs  Nurse does not report any concerns, vitals stable.      Past Medical History:  Diagnosis Date   Anemia    leakoppenia   BPH (benign prostatic hypertrophy)    Bullous pemphigoid    [name], March 2011, right forearm squamous cell carcinoma   Chronic anticoagulation    systemic   Colon polyp    transverse, 2002   History of peptic ulcer    remote, 3/95   Hx of actinic keratosis    Hx of basal cell carcinoma    Hx of squamous cell carcinoma of skin    Hyperlipidemia    Left inguinal hernia    Moderate aortic insufficiency 2009   audible aortic insufficiency on 1/09 echo   PAF (paroxysmal atrial fibrillation) ([place]) 01/17/2014   On Warfarin.   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 Jude mechanical valve   ORIF FEMUR FRACTURE Left 08/28/2020   Procedure: OPEN REDUCTION INTERNAL FIXATION (ORIF) DISTAL FEMUR FRACTURE;  Surgeon: Rod Can, MD;  Location: [place];  Service: Orthopedics;  Laterality: Left;   TOTAL HIP ARTHROPLASTY Right 10/12/2017   Procedure: RIGHT TOTAL HIP ARTHROPLASTY ANTERIOR APPROACH;  Surgeon: Gaynelle Arabian, MD;  Location: [facility];  Service: Orthopedics;  Laterality: Right;   TRANSTHORACIC ECHOCARDIOGRAM  12/2018   Unable to assess diastolic function because of A. fib. Normal RV function, but moderately elevated RVSP.  Severe biatrial enlargement. S/P St Jude bileaflet mechanical MVR that appears to be functioning normally. Mitral valve regurgitation cannot assess due to mechanical valve shadowing. MV Mean grad: 7.0 mmHg MV Area (PHT): 3.38 cm (stable for valve).  Mild Ao Sclerosis, Mild-Mod AI   TRANSTHORACIC ECHOCARDIOGRAM  08/'17; 10/'18    a) Mild conc LVH. EF 55-60%. No RWMA. Mod AI. Mechanical MV prosthesis functioning properly. LAD dilation.;; b)  EF 55-60%.  Mo AI.  Bileaflet Saint Jude mechanical MV with no paravalvular leak.  Severe LA dilation.  Minimally elevated PAP    Allergies  Allergen Reactions   Flexeril [Cyclobenzaprine] Diarrhea    Outpatient Encounter Medications as of 12/17/2020  Medication Sig   acetaminophen (TYLENOL) 325 MG tablet Take 650 mg by mouth 3 (three) times daily as needed.   docusate sodium (COLACE) 100 MG capsule Take 100 mg by mouth daily as needed for mild constipation.   fluticasone (FLONASE) 50 MCG/ACT nasal spray Place 1 spray into both nostrils daily as needed for allergies or rhinitis.   isosorbide mononitrate (IMDUR) 30 MG 24 hr tablet Take 15 mg by mouth daily.   lactose free nutrition (BOOST) LIQD Take 237 mLs by mouth 2 (two) times daily between meals.   LORazepam (ATIVAN) 1 MG tablet Take 1 mg by mouth daily as needed for anxiety.   mirtazapine (REMERON) 7.5 MG tablet Take 15 mg by mouth.   Multiple Vitamins-Minerals (MULTIVITAMIN PO) Take 1 tablet by mouth daily.   Multiple Vitamins-Minerals (PRESERVISION AREDS 2 PO) Take 1 capsule by mouth daily.    polyethylene glycol (MIRALAX / GLYCOLAX) 17 g packet Take 17 g by mouth daily as needed.   tamsulosin (FLOMAX) 0.4 MG CAPS capsule Take 0.4 mg by mouth at bedtime.   warfarin (COUMADIN) 1 MG tablet Take 1 mg by mouth once a week. On WED   warfarin (COUMADIN) 3 MG tablet Take 3 mg by mouth once a week. On WED   warfarin (COUMADIN) 4 MG tablet Take 4 mg by mouth daily.   zinc oxide 20 % ointment Apply 1 application topically as needed for irritation.   No facility-administered encounter medications on file as of 12/17/2020.    Review of Systems  Constitutional:  Negative for activity change, appetite change, fatigue and fever.  HENT:  Negative for congestion, hearing loss and trouble swallowing.   Eyes: Negative.   Respiratory:  Negative for cough, shortness of breath and wheezing.   Cardiovascular:  Negative for chest pain and leg swelling.  Gastrointestinal:  Positive for constipation. Negative for abdominal distention, abdominal pain, diarrhea and nausea.  Genitourinary:  Negative for difficulty urinating, dysuria, frequency and hematuria.  Musculoskeletal:  Positive for arthralgias, gait problem and myalgias.  Skin:        Discoloration lower legs, right shin growth  Neurological:  Positive for weakness. Negative for dizziness and headaches.  Hematological:  Bruises/bleeds easily.  Psychiatric/Behavioral:  Negative for confusion and dysphoric mood. The patient is not nervous/anxious.    Immunization History  Administered Date(s) Administered   Influenza, High Dose Seasonal PF 04/20/2016   Influenza,inj,Quad PF,6+ Mos 03/28/2018   Influenza-Unspecified 03/30/2017   Moderna SARS-COV2 Booster Vaccination 12/03/2020   Moderna Sars-Covid-2 Vaccination 07/02/2019, 07/30/2019, 05/12/2020   Pneumococcal Conjugate-13 06/10/2014   Pneumococcal-Unspecified 02/02/2006   Tdap 06/10/2014   Zoster, Live 03/29/2015   Pertinent  Health Maintenance Due  Topic Date Due   INFLUENZA VACCINE   01/26/2021   PNA vac Low Risk Adult  Completed   No flowsheet data found. Functional Status Survey:    Vitals:   12/17/20 1435  BP: (!) 124/53  Pulse: 63  Resp: 16  Temp: 97.6 F (36.4 C)  SpO2: 94%  Weight: 149 lb 3.2 oz (67.7 kg)  Height: 6' (1.829 m)   Body mass index is 20.24 kg/m. Physical Exam Vitals reviewed.  Constitutional:      General: He is not in acute distress. HENT:     Head: Normocephalic.     Right Ear: There is no impacted cerumen.     Left Ear: There is no impacted cerumen.     Nose: Nose normal.     Mouth/Throat:     Mouth: Mucous membranes are moist.  Eyes:     General:        Right eye: No discharge.        Left eye: No discharge.  Neck:     Vascular: No carotid bruit.  Cardiovascular:     Rate and Rhythm: Normal rate and regular rhythm.     Pulses: Normal pulses.     Heart sounds: Normal heart sounds. No murmur heard. Pulmonary:     Effort: Pulmonary effort is normal. No respiratory distress.     Breath sounds: Normal breath sounds. No wheezing.  Abdominal:     General: Abdomen is flat. Bowel sounds are normal. There is no distension.     Palpations: Abdomen is soft.     Tenderness: There is no abdominal tenderness.  Musculoskeletal:     Cervical back: Normal range of motion.     Right lower leg: No edema.     Left lower leg: No edema.  Lymphadenopathy:     Cervical: No cervical adenopathy.  Skin:    General: Skin is warm and dry.     Capillary Refill: Capillary refill takes less than 2 seconds.     Findings: Lesion present.     Comments: BLE skin dry and leathery appearance. Dime sized growth to right anterior shin, non tender, no drainage.   Neurological:     General: No focal deficit present.     Mental Status: He is alert and oriented to person, place, and time.     Motor: Weakness present.     Gait: Gait abnormal.     Comments: walker  Psychiatric:        Mood and Affect: Mood normal.        Behavior: Behavior normal.     Labs reviewed: Recent Labs    08/31/20 0111 09/01/20 0247 09/02/20 0130 09/08/20 0000 09/23/20 0000 10/15/20 0000 10/23/20 1035  NA 133*  133* 134*   < > 131* 136* 134*  K 3.8 3.7 4.0   < > 4.1 4.0 4.6  CL 102 103 104   < > 99 102 104  CO2 24 23 22    < > 24* 23* 23*  GLUCOSE 104* 88 97  --   --   --   --   BUN 26* 27* 25*   < > 33* 53* 33*  CREATININE 1.00 0.99 1.05   < > 1.0 1.4* 1.2  CALCIUM 7.9* 7.9* 8.0*   < > 8.6* 8.8 8.6*  MG 2.4 2.2 2.1  --   --   --   --    < > = values in this interval not displayed.   Recent Labs    08/25/20 1832 08/26/20 0332 09/08/20 0000 09/23/20 0000 10/15/20 0000 10/23/20 1035  AST 39 34   < > 38 122* 50*  ALT 24 21   < > 38 164* 96*  ALKPHOS 54 42   < > 93 95 77  BILITOT 1.1 1.1  --   --   --   --   PROT 5.8* 5.3*  --   --   --   --   ALBUMIN 3.4* 2.9*   < > 3.1* 3.3* 5.7*   < > = values in this interval not displayed.   Recent Labs    08/31/20 0111 09/01/20 0247 09/02/20 0130 09/08/20 0000 09/23/20 0000 10/15/20 0000  WBC 4.4 4.0 4.0 4.9 3.1 6.0  NEUTROABS 3.1  --  2.7 3,729.00 2,062.00  --   HGB 10.4* 10.0* 9.7* 10.8* 10.8* 11.4*  HCT 31.3* 29.1* 28.1* 32* 33* 35*  MCV 93.4 92.4 92.7  --   --   --   PLT 122* 144* 164 366 183 248   Lab Results  Component Value Date   TSH 2.39 10/15/2020   No results found for: HGBA1C No results found for: CHOL, HDL, LDLCALC, LDLDIRECT, TRIG, CHOLHDL  Significant Diagnostic Results in last 30 days:  No results found.  Assessment/Plan 1. Longstanding persistent atrial fibrillation: CHA2DS2-VASc Score 3 - rate controlled without medication - PT/INR 3.2 06/17- cont current coumadin regimen - recheck PT/INR in 3 weeks  2. S/P MVR (mitral valve replacement) - goal INR 2.5-3.5 - cont current coumadin regimen  3. Essential hypertension - controlled, Zebeta d/c last months - Imdur decreased to 15 mg daily - cont low sodium diet  4. Weight loss - no weight gain or loss within  last month - cont remeron 15 mg qhs  5. Anxiety - no recent panic attacks - cont ativan 1 mg po daily prn  6. Benign prostatic hyperplasia without lower urinary tract symptoms - followed by Dr. Gloriann Loan - CT done 06/15- results not in [place] - will f/u with CT report  7. Slow transit constipation - stable with prn colace and miralax  8. Cancer of skin of right lower leg - no changes from last month - scheduled for in house dermatology clinic  9. Abnormal gait - femur fracture resolves - ambulating > 200 ft with walker - cont PT/OT   Family/ staff Communication: plan discussed with patient and nurse  Labs/tests ordered:  none

## 2020-12-18 ENCOUNTER — Encounter: Payer: Self-pay | Admitting: Internal Medicine

## 2020-12-18 DIAGNOSIS — N130 Hydronephrosis with ureteropelvic junction obstruction: Secondary | ICD-10-CM | POA: Diagnosis not present

## 2020-12-18 DIAGNOSIS — R338 Other retention of urine: Secondary | ICD-10-CM | POA: Diagnosis not present

## 2020-12-18 NOTE — Progress Notes (Signed)
Location:   [place] Room Number: 29 Place of Service:  SNF [phone]) Provider:  Veleta Miners MD  Virgie Dad, MD  Patient Care Team: Virgie Dad, MD as PCP - General (Internal Medicine)  Extended Emergency Contact Information Primary Emergency Contact: [place], [place] Mobile Phone: [phone] Relation: Son Secondary Emergency Contact: [facility] Address: [address]          [place]  [place] Home Phone: [phone] Relation: Spouse  Code Status:  DNR Goals of care: Advanced Directive information Advanced Directives 12/18/2020  Does Patient Have a Medical Advance Directive? Yes  Type of Advance Directive Out of facility DNR (pink MOST or yellow form);Living will  Does patient want to make changes to medical advance directive? No - Patient declined  Copy of [place] in Chart? -  Would patient like information on creating a medical advance directive? -  Pre-existing out of facility DNR order (yellow form or pink MOST form) Pink MOST form placed in chart (order not valid for inpatient use);Yellow form placed in chart (order not valid for inpatient use)     Chief Complaint  Patient presents with   Acute Visit    Family requested visit    HPI:  Pt is a 87 y.o. male seen today for an acute visit for    Past Medical History:  Diagnosis Date   Anemia    leakoppenia   BPH (benign prostatic hypertrophy)    Bullous pemphigoid    [name], March 2011, right forearm squamous cell carcinoma   Chronic anticoagulation    systemic   Colon polyp    transverse, 2002   History of peptic ulcer    remote, 3/95   Hx of actinic keratosis    Hx of basal cell carcinoma    Hx of squamous cell carcinoma of skin    Hyperlipidemia    Left inguinal hernia    Moderate aortic insufficiency 2009   audible aortic insufficiency on 1/09 echo   PAF (paroxysmal atrial fibrillation) ([place]) 01/17/2014   On Warfarin.   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 Jude mechanical valve   ORIF FEMUR FRACTURE Left 08/28/2020   Procedure: OPEN REDUCTION INTERNAL FIXATION (ORIF) DISTAL FEMUR FRACTURE;  Surgeon: Rod Can, MD;  Location: [place];  Service: Orthopedics;  Laterality: Left;   TOTAL HIP ARTHROPLASTY Right 10/12/2017   Procedure: RIGHT TOTAL HIP ARTHROPLASTY ANTERIOR APPROACH;  Surgeon: Gaynelle Arabian, MD;  Location: [facility];  Service: Orthopedics;  Laterality: Right;   TRANSTHORACIC ECHOCARDIOGRAM  12/2018   Unable to assess diastolic function because of A. fib. Normal RV function, but moderately elevated RVSP.  Severe biatrial enlargement. S/P St Jude bileaflet mechanical MVR that appears to be functioning normally. Mitral valve regurgitation cannot assess due to mechanical valve shadowing. MV Mean grad: 7.0 mmHg MV Area (PHT): 3.38 cm (stable for valve).  Mild Ao Sclerosis, Mild-Mod AI   TRANSTHORACIC ECHOCARDIOGRAM  08/'17; 10/'18    a) Mild conc LVH. EF 55-60%. No RWMA. Mod AI. Mechanical  MV prosthesis functioning properly. LAD dilation.;; b)  EF 55-60%.  Mo AI.  Bileaflet Saint Jude mechanical MV with no paravalvular leak.  Severe LA dilation.  Minimally elevated PAP    Allergies  Allergen Reactions   Flexeril [Cyclobenzaprine] Diarrhea    Allergies as of 12/18/2020       Reactions   Flexeril [cyclobenzaprine] Diarrhea        Medication List        Accurate as of December 18, 2020  3:05 PM. If you have any questions, ask your nurse or doctor.          acetaminophen 325 MG tablet Commonly known as: TYLENOL Take  650 mg by mouth every 8 (eight) hours as needed.   docusate sodium 100 MG capsule Commonly known as: COLACE Take 100 mg by mouth daily as needed for mild constipation.   fluticasone 50 MCG/ACT nasal spray Commonly known as: FLONASE Place 1 spray into both nostrils daily as needed for allergies or rhinitis.   isosorbide mononitrate 30 MG 24 hr tablet Commonly known as: IMDUR Take 15 mg by mouth daily.   lactose free nutrition Liqd Take 237 mLs by mouth 2 (two) times daily between meals.   LORazepam 1 MG tablet Commonly known as: ATIVAN Take 1 mg by mouth daily as needed for anxiety.   mirtazapine 7.5 MG tablet Commonly known as: REMERON Take 15 mg by mouth.   MULTIVITAMIN PO Take 1 tablet by mouth daily.   polyethylene glycol 17 g packet Commonly known as: MIRALAX / GLYCOLAX Take 17 g by mouth daily as needed.   PRESERVISION AREDS 2 PO Take 1 capsule by mouth daily.   tamsulosin 0.4 MG Caps capsule Commonly known as: FLOMAX Take 0.8 mg by mouth at bedtime.   warfarin 4 MG tablet Commonly known as: COUMADIN Take 4 mg by mouth daily. SAT/SUN/TUE/THUR   warfarin 3 MG tablet Commonly known as: COUMADIN Take 3 mg by mouth. Once A Day on Mon, Wed, Fri   zinc oxide 20 % ointment Apply 1 application topically as needed for irritation.        Review of Systems  Immunization History  Administered Date(s) Administered   Influenza, High Dose Seasonal PF 04/20/2016   Influenza,inj,Quad PF,6+ Mos 03/28/2018   Influenza-Unspecified 03/30/2017   Moderna SARS-COV2 Booster Vaccination 12/03/2020   Moderna Sars-Covid-2 Vaccination 07/02/2019, 07/30/2019, 05/12/2020   Pneumococcal Conjugate-13 06/10/2014   Pneumococcal-Unspecified 02/02/2006   Tdap 06/10/2014   Zoster, Live 03/29/2015   Pertinent  Health Maintenance Due  Topic Date Due   INFLUENZA VACCINE  01/26/2021   PNA vac Low Risk Adult  Completed   No flowsheet data found. Functional Status Survey:     Vitals:   12/18/20 1441  BP: (!) 124/53  Pulse: 63  Resp: 16  Temp: (!) 97.2 F (36.2 C)  SpO2: 94%  Weight: 149 lb 3.2 oz (67.7 kg)  Height: 6' (1.829 m)   Body mass index is 20.24 kg/m. Physical Exam  Labs reviewed: Recent Labs    08/31/20 0111 09/01/20 0247 09/02/20 0130 09/08/20 0000 09/23/20 0000 10/15/20 0000 10/23/20 1035  NA 133* 133* 134*   < > 131* 136* 134*  K 3.8 3.7 4.0   < > 4.1 4.0 4.6  CL 102 103 104   < > 99 102 104  CO2 24 23 22    < > 24* 23* 23*  GLUCOSE 104* 88 97  --   --   --   --  BUN 26* 27* 25*   < > 33* 53* 33*  CREATININE 1.00 0.99 1.05   < > 1.0 1.4* 1.2  CALCIUM 7.9* 7.9* 8.0*   < > 8.6* 8.8 8.6*  MG 2.4 2.2 2.1  --   --   --   --    < > = values in this interval not displayed.   Recent Labs    08/25/20 1832 08/26/20 0332 09/08/20 0000 09/23/20 0000 10/15/20 0000 10/23/20 1035  AST 39 34   < > 38 122* 50*  ALT 24 21   < > 38 164* 96*  ALKPHOS 54 42   < > 93 95 77  BILITOT 1.1 1.1  --   --   --   --   PROT 5.8* 5.3*  --   --   --   --   ALBUMIN 3.4* 2.9*   < > 3.1* 3.3* 5.7*   < > = values in this interval not displayed.   Recent Labs    08/31/20 0111 09/01/20 0247 09/02/20 0130 09/08/20 0000 09/23/20 0000 10/15/20 0000  WBC 4.4 4.0 4.0 4.9 3.1 6.0  NEUTROABS 3.1  --  2.7 3,729.00 2,062.00  --   HGB 10.4* 10.0* 9.7* 10.8* 10.8* 11.4*  HCT 31.3* 29.1* 28.1* 32* 33* 35*  MCV 93.4 92.4 92.7  --   --   --   PLT 122* 144* 164 366 183 248   Lab Results  Component Value Date   TSH 2.39 10/15/2020   No results found for: HGBA1C No results found for: CHOL, HDL, LDLCALC, LDLDIRECT, TRIG, CHOLHDL  Significant Diagnostic Results in last 30 days:  No results found.  Assessment/Plan There are no diagnoses linked to this encounter.   Family/ staff Communication:   Labs/tests ordered:

## 2020-12-23 ENCOUNTER — Non-Acute Institutional Stay (SKILLED_NURSING_FACILITY): Payer: Medicare Other | Admitting: Nurse Practitioner

## 2020-12-23 ENCOUNTER — Encounter: Payer: Self-pay | Admitting: Nurse Practitioner

## 2020-12-23 DIAGNOSIS — N189 Chronic kidney disease, unspecified: Secondary | ICD-10-CM | POA: Insufficient documentation

## 2020-12-23 DIAGNOSIS — N400 Enlarged prostate without lower urinary tract symptoms: Secondary | ICD-10-CM | POA: Diagnosis not present

## 2020-12-23 DIAGNOSIS — D539 Nutritional anemia, unspecified: Secondary | ICD-10-CM | POA: Diagnosis not present

## 2020-12-23 DIAGNOSIS — N1831 Chronic kidney disease, stage 3a: Secondary | ICD-10-CM

## 2020-12-23 DIAGNOSIS — M159 Polyosteoarthritis, unspecified: Secondary | ICD-10-CM | POA: Diagnosis not present

## 2020-12-23 DIAGNOSIS — Z952 Presence of prosthetic heart valve: Secondary | ICD-10-CM

## 2020-12-23 DIAGNOSIS — T148XXA Other injury of unspecified body region, initial encounter: Secondary | ICD-10-CM | POA: Insufficient documentation

## 2020-12-23 DIAGNOSIS — K5901 Slow transit constipation: Secondary | ICD-10-CM | POA: Diagnosis not present

## 2020-12-23 DIAGNOSIS — I4811 Longstanding persistent atrial fibrillation: Secondary | ICD-10-CM

## 2020-12-23 DIAGNOSIS — N183 Chronic kidney disease, stage 3 unspecified: Secondary | ICD-10-CM | POA: Insufficient documentation

## 2020-12-23 DIAGNOSIS — W19XXXS Unspecified fall, sequela: Secondary | ICD-10-CM

## 2020-12-23 DIAGNOSIS — G4700 Insomnia, unspecified: Secondary | ICD-10-CM

## 2020-12-23 DIAGNOSIS — I10 Essential (primary) hypertension: Secondary | ICD-10-CM | POA: Diagnosis not present

## 2020-12-23 NOTE — Assessment & Plan Note (Signed)
Macrocytic anemia, post op, transfused PRBC total 7u, Iron 60, Vit B12 478, Folate 44. Hgb 11.4 11/14/20

## 2020-12-23 NOTE — Assessment & Plan Note (Signed)
[place], needs assistance with transfer.

## 2020-12-23 NOTE — Assessment & Plan Note (Signed)
Constipation, takes Colace, MiraLax

## 2020-12-23 NOTE — Assessment & Plan Note (Signed)
BPH no urinary retention. Takes Tamsulosin.  Had CT at urology. Last seen urology 12/15/20

## 2020-12-23 NOTE — Assessment & Plan Note (Signed)
S/P MVR  goal of INR 2.5-3.5, takes Coumadin

## 2020-12-23 NOTE — Progress Notes (Signed)
Location:   [place] Room Number: [place] of Service:  SNF (31) Provider:  Brittaney Beaulieu Otho Darner, NP    Patient Care Team: Virgie Dad, MD as PCP - General (Internal Medicine)  Extended Emergency Contact Information Primary Emergency Contact: Enes, Rokosz Mobile Phone: [phone] Relation: Son Secondary Emergency Contact: [facility] Address: [address]          [place]  [place] Home Phone: [phone] Relation: Spouse  Code Status:  DNR Goals of care: Advanced Directive information Advanced Directives 12/23/2020  Does Patient Have a Medical Advance Directive? Yes  Type of Advance Directive Living will;Out of facility DNR (pink MOST or yellow form)  Does patient want to make changes to medical advance directive? No - Patient declined  Copy of [place] in Chart? -  Would patient like information on creating a medical advance directive? -  Pre-existing out of facility DNR order (yellow form or pink MOST form) Yellow form placed in chart (order not valid for inpatient use);Pink MOST form placed in chart (order not valid for inpatient use)     Chief Complaint  Patient presents with   Acute Visit    Patient complains of a reddened area on left flank.     HPI:  Pt is a 87 y.o. Spears seen today for an acute visit for a patient palm sized reddened area in the left flank, the patient stated painful when lying on it, but its getting better. The patient also stated it was an area in the right flank region earlier, it resolved with no intervention. No skin opening, slightly warm, but no pain palpated or with deep breathing. Otherwise the patient is in his usual state of health, sitting on the commode upon our entering his room.   S/p closed displaced fracture of distal epiphysis of left femur, ORIF 08/28/20, TDW with walker, f/u Ortho             S/P MVR  goal of INR 2.5-3.5, takes Coumadin             BPH no urinary retention.  Takes Tamsulosin.  Had CT at urology.              OA s/p R+L hip arhtroplasties             PAF takes Coumadin             HTN takes Isosorbide. Bun/creat 33/1.2 eGFR 52 10/23/20  CKD Bun/creat 33/1.2 eGFR 52 10/23/20             Macrocytic anemia, post op, transfused PRBC total 7u, Iron 60, Vit B12 478, Folate 44. Hgb 11.4 11/14/20             Fall, needs assistance with transfer.             Constipation, takes Colace, MiraLax             Insomnia, sleeps and eats better, takes Mirtazapine, Lorazepam. TSH 2.39 10/15/20  Past Medical History:  Diagnosis Date   Anemia    leakoppenia   BPH (benign prostatic hypertrophy)    Bullous pemphigoid    Richard Spears, March 2011, right forearm squamous cell carcinoma   Chronic anticoagulation    Richard Spears   Colon polyp    transverse, 2002   History of peptic ulcer    remote, 3/95   Hx of actinic keratosis    Hx of basal cell carcinoma    Hx  of squamous cell carcinoma of skin    Hyperlipidemia    Left inguinal hernia    Moderate aortic insufficiency 2009   audible aortic insufficiency on 1/09 echo   PAF (paroxysmal atrial fibrillation) ([place]) 01/17/2014   On Warfarin.   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 Jude mechanical valve   ORIF FEMUR FRACTURE Left 08/28/2020   Procedure: OPEN REDUCTION INTERNAL FIXATION (ORIF) DISTAL FEMUR FRACTURE;  Surgeon: Rod Can, MD;  Location: [facility];  Service: Orthopedics;  Laterality: Left;   TOTAL HIP ARTHROPLASTY Right 10/12/2017   Procedure: RIGHT TOTAL HIP ARTHROPLASTY ANTERIOR APPROACH;  Surgeon: Gaynelle Arabian, MD;   Location: [facility];  Service: Orthopedics;  Laterality: Right;   TRANSTHORACIC ECHOCARDIOGRAM  12/2018   Unable to assess diastolic function because of A. fib. Normal RV function, but moderately elevated RVSP.  Severe biatrial enlargement. S/P St Jude bileaflet mechanical MVR that appears to be functioning normally. Mitral valve regurgitation cannot assess due to mechanical valve shadowing. MV Mean grad: 7.0 mmHg MV Area (PHT): 3.38 cm (stable for valve).  Mild Ao Sclerosis, Mild-Mod AI   TRANSTHORACIC ECHOCARDIOGRAM  08/'17; 10/'18    a) Mild conc LVH. EF 55-60%. No RWMA. Mod AI. Mechanical MV prosthesis functioning properly. LAD dilation.;; b)  EF 55-60%.  Mo AI.  Bileaflet Saint Jude mechanical MV with no paravalvular leak.  Severe LA dilation.  Minimally elevated PAP    Allergies  Allergen Reactions   Flexeril [Cyclobenzaprine] Diarrhea    Allergies as of 12/23/2020       Reactions   Flexeril [cyclobenzaprine] Diarrhea        Medication List        Accurate as of December 23, 2020 12:12 PM. If you have any questions, ask your nurse or doctor.          acetaminophen 325 MG tablet Commonly known as: TYLENOL Take 650 mg by mouth every 8 (eight) hours as needed.   docusate sodium 100 MG capsule Commonly known as: COLACE Take 100 mg by mouth daily as needed for mild constipation.   fluticasone 50 MCG/ACT nasal spray Commonly known as: FLONASE Place 1 spray into both nostrils daily as needed for allergies or rhinitis.   isosorbide mononitrate 30 MG 24 hr tablet Commonly known as: IMDUR Take 15 mg by mouth daily.   lactose free nutrition Liqd Take 237 mLs by mouth 2 (two) times daily between meals.   LORazepam 1 MG tablet Commonly known as: ATIVAN Take 1 mg by mouth daily as needed for anxiety.   mirtazapine 7.5 MG tablet Commonly known as: REMERON Take 15 mg by mouth.   MULTIVITAMIN PO Take 1 tablet by mouth daily.   polyethylene glycol 17 g packet Commonly known  as: MIRALAX / GLYCOLAX Take 17 g by mouth daily as needed.   PRESERVISION AREDS 2 PO Take 1 capsule by mouth daily.   tamsulosin 0.4 MG Caps capsule Commonly known as: FLOMAX Take 0.8 mg by mouth at bedtime.   warfarin 4 MG tablet Commonly known as: COUMADIN Take  4 mg by mouth daily. SAT/SUN/TUE/THUR   warfarin 3 MG tablet Commonly known as: COUMADIN Take 3 mg by mouth. Once A Day on Mon, Wed, Fri   zinc oxide 20 % ointment Apply 1 application topically as needed for irritation.        Review of Systems  Constitutional:  Negative for appetite change, fatigue and fever.  HENT:  Positive for hearing loss. Negative for congestion and voice change.   Respiratory:  Negative for cough and shortness of breath.   Cardiovascular:  Positive for leg swelling.  Gastrointestinal:  Negative for abdominal pain and constipation.  Genitourinary:  Negative for difficulty urinating, dysuria and urgency.       Incontinent of urine, uses condom catheter.   Musculoskeletal:  Positive for arthralgias and gait problem.  Skin:  Negative for color change.       A palm sized area in the left frank, mild redness, swelling, and warmth, no pain palpated or with deep breathing, but the patient stated it feels sore when lying on it  Neurological:  Negative for speech difficulty, weakness and light-headedness.       Memory lapses.   Psychiatric/Behavioral:  Negative for behavioral problems and sleep disturbance. The patient is not nervous/anxious.    Immunization History  Administered Date(s) Administered   Influenza, High Dose Seasonal PF 04/20/2016   Influenza,inj,Quad PF,6+ Mos 03/28/2018   Influenza-Unspecified 03/30/2017   Moderna SARS-COV2 Booster Vaccination 12/03/2020   Moderna Sars-Covid-2 Vaccination 07/02/2019, 07/30/2019, 05/12/2020   Pneumococcal Conjugate-13 06/10/2014   Pneumococcal-Unspecified 02/02/2006   Tdap 06/10/2014   Zoster, Live 03/29/2015   Pertinent  Health Maintenance  Due  Topic Date Due   INFLUENZA VACCINE  01/26/2021   PNA vac Low Risk Adult  Completed   No flowsheet data found. Functional Status Survey:    Vitals:   12/23/20 1115  BP: (!) 124/53  Pulse: 63  Resp: 16  Temp: (!) 97.4 F (36.3 C)  SpO2: 96%  Weight: 149 lb 3.2 oz (67.7 kg)  Height: 6' (1.829 m)   Body mass index is 20.24 kg/m. Physical Exam Vitals and nursing note reviewed.  Constitutional:      Appearance: Normal appearance.  HENT:     Head: Normocephalic and atraumatic.     Mouth/Throat:     Mouth: Mucous membranes are moist.  Eyes:     Extraocular Movements: Extraocular movements intact.     Conjunctiva/sclera: Conjunctivae normal.     Pupils: Pupils are equal, round, and reactive to light.  Cardiovascular:     Rate and Rhythm: Normal rate. Rhythm irregular.     Heart sounds: Murmur heard.  Pulmonary:     Effort: Pulmonary effort is normal.     Breath sounds: No rales.  Abdominal:     General: Bowel sounds are normal.     Palpations: Abdomen is soft.     Tenderness: There is no abdominal tenderness.  Musculoskeletal:     Cervical back: Normal range of motion and neck supple.     Right lower leg: Edema present.     Left lower leg: Edema present.     Comments: Trace edema BLE  Skin:    General: Skin is warm and dry.     Findings: Erythema present.     Comments: Left hip surgical wound is healed. Dark pigmented venous insufficiency skin changes BLE. A palm sized area in the left frank, mild redness, swelling, and warmth, no pain palpated or with deep breathing, but the patient  stated it feels sore when lying on it   Neurological:     General: No focal deficit present.     Mental Status: He is alert. Mental status is at baseline.     Motor: No weakness.     Coordination: Coordination normal.     Gait: Gait abnormal.  Psychiatric:        Mood and Affect: Mood normal.        Behavior: Behavior normal.        Thought Content: Thought content normal.     Labs reviewed: Recent Labs    08/31/20 0111 09/01/20 0247 09/02/20 0130 09/08/20 0000 09/23/20 0000 10/15/20 0000 10/23/20 1035  NA 133* 133* 134*   < > 131* 136* 134*  K 3.8 3.7 4.0   < > 4.1 4.0 4.6  CL 102 103 104   < > 99 102 104  CO2 $Re'24 23 22   'mxC$ < > 24* 23* 23*  GLUCOSE 104* 88 97  --   --   --   --   BUN 26* 27* 25*   < > 33* 53* 33*  CREATININE 1.00 0.99 1.05   < > 1.0 1.4* 1.2  CALCIUM 7.9* 7.9* 8.0*   < > 8.6* 8.8 8.6*  MG 2.4 2.2 2.1  --   --   --   --    < > = values in this interval not displayed.   Recent Labs    08/25/20 1832 08/26/20 0332 09/08/20 0000 09/23/20 0000 10/15/20 0000 10/23/20 1035  AST 39 34   < > 38 122* 50*  ALT 24 21   < > 38 164* 96*  ALKPHOS 54 42   < > 93 95 77  BILITOT 1.1 1.1  --   --   --   --   PROT 5.8* 5.3*  --   --   --   --   ALBUMIN 3.4* 2.9*   < > 3.1* 3.3* 5.7*   < > = values in this interval not displayed.   Recent Labs    08/31/20 0111 09/01/20 0247 09/02/20 0130 09/08/20 0000 09/23/20 0000 10/15/20 0000  WBC 4.4 4.0 4.0 4.9 3.1 6.0  NEUTROABS 3.1  --  2.7 3,729.00 2,062.00  --   HGB 10.4* 10.0* 9.7* 10.8* 10.8* 11.4*  HCT 31.3* 29.1* 28.1* 32* 33* 35*  MCV 93.4 92.4 92.7  --   --   --   PLT 122* 144* 164 366 183 248   Lab Results  Component Value Date   TSH 2.39 10/15/2020   No results found for: HGBA1C No results found for: CHOL, HDL, LDLCALC, LDLDIRECT, TRIG, CHOLHDL  Significant Diagnostic Results in last 30 days:  No results found.  Assessment/Plan Contusion of soft tissue a patient palm sized reddened area in the left flank, the patient stated painful when lying on it, but its getting better. The patient also stated it was an area in the right flank region earlier, it resolved with no intervention. No skin opening, slightly warm, but no pain palpated or with deep breathing. Otherwise the patient is in his usual state of health, sitting on the commode upon our entering his room. The patient denied  injury or fall. Will try Biofreeze oint 4x/day x 10 day to the area, observe.   Generalized osteoarthritis of multiple sites S/p closed displaced fracture of distal epiphysis of left femur, ORIF 08/28/20, TDW with walker, f/u Ortho. s/p R+L hip arhtroplasties  S/P MVR (mitral  valve replacement) S/P MVR  goal of INR 2.5-3.5, takes Coumadin  BPH (benign prostatic hyperplasia) BPH no urinary retention. Takes Tamsulosin.  Had CT at urology. Last seen urology 12/15/20  Longstanding persistent atrial fibrillation: CHA2DS2-VASc Score 3 Heart rate is in control, off rate control agent, takes Coumadin  Essential hypertension takes Isosorbide. Bun/creat 33/1.2 eGFR 52 10/23/20  CKD (chronic kidney disease) stage 3, GFR 30-59 ml/min (HCC) Bun/creat 33/1.2 eGFR 52 10/23/20  Macrocytic anemia Macrocytic anemia, post op, transfused PRBC total 7u, Iron 60, Vit B12 478, Folate 44. Hgb 11.4 11/14/20  Fall Fall, needs assistance with transfer.  Slow transit constipation Constipation, takes Colace, MiraLax  Insomnia nsomnia, sleeps and eats better, takes Mirtazapine, Lorazepam. TSH 2.39 10/15/20    Family/ staff Communication: plan of care reviewed with the patient and charge nurse.   Labs/tests ordered:  none  Time spend 35 minutes.

## 2020-12-23 NOTE — Assessment & Plan Note (Signed)
Heart rate is in control, off rate control agent, takes Coumadin

## 2020-12-23 NOTE — Assessment & Plan Note (Signed)
a patient palm sized reddened area in the left flank, the patient stated painful when lying on it, but its getting better. The patient also stated it was an area in the right flank region earlier, it resolved with no intervention. No skin opening, slightly warm, but no pain palpated or with deep breathing. Otherwise the patient is in his usual state of health, sitting on the commode upon our entering his room. The patient denied injury or fall. Will try Biofreeze oint 4x/day x 10 day to the area, observe.

## 2020-12-23 NOTE — Assessment & Plan Note (Addendum)
S/p closed displaced fracture of distal epiphysis of left femur, ORIF 08/28/20, TDW with walker, f/u Ortho. s/p R+L hip arhtroplasties

## 2020-12-23 NOTE — Assessment & Plan Note (Signed)
Bun/creat 33/1.2 eGFR 52 10/23/20

## 2020-12-23 NOTE — Assessment & Plan Note (Signed)
takes Isosorbide. Bun/creat 33/1.2 eGFR 52 10/23/20

## 2020-12-23 NOTE — Assessment & Plan Note (Signed)
nsomnia, sleeps and eats better, takes Mirtazapine, Lorazepam. TSH 2.39 10/15/20

## 2020-12-24 ENCOUNTER — Encounter: Payer: Self-pay | Admitting: Orthopedic Surgery

## 2020-12-24 ENCOUNTER — Non-Acute Institutional Stay (INDEPENDENT_AMBULATORY_CARE_PROVIDER_SITE_OTHER): Payer: Medicare Other | Admitting: Orthopedic Surgery

## 2020-12-24 DIAGNOSIS — Z0000 Encounter for general adult medical examination without abnormal findings: Secondary | ICD-10-CM

## 2020-12-24 NOTE — Progress Notes (Signed)
Subjective:   Richard Spears is a 87 y.o. male who presents for Medicare Annual/Subsequent preventive examination.  Review of Systems     Cardiac Risk Factors include: smoking/ tobacco exposure;hypertension;advanced age (>48men, >25 women)     Objective:    Today's Vitals   12/24/20 1036  BP: (!) 108/53  Pulse: 85  Resp: 18  Temp: 97.7 F (36.5 C)  SpO2: 99%  Weight: 149 lb 3.2 oz (67.7 kg)  Height: 6' (1.829 m)   Body mass index is 20.24 kg/m.  Advanced Directives 12/24/2020 12/23/2020 12/18/2020 11/12/2020 10/20/2020 09/26/2020 09/11/2020  Does Patient Have a Medical Advance Directive? Yes Yes Yes - No Yes Yes  Type of Advance Directive Out of facility DNR (pink MOST or yellow form);Living will Living will;Out of facility DNR (pink MOST or yellow form) Out of facility DNR (pink MOST or yellow form);Living will Living will;Out of facility DNR (pink MOST or yellow form) - Out of facility DNR (pink MOST or yellow form);Living will Out of facility DNR (pink MOST or yellow form);Living will  Does patient want to make changes to medical advance directive? No - Patient declined No - Patient declined No - Patient declined - No - Patient declined - No - Patient declined  Copy of [place] in Chart? - - - - - - -  Would patient like information on creating a medical advance directive? - - - - - - -  Pre-existing out of facility DNR order (yellow form or pink MOST form) - Yellow form placed in chart (order not valid for inpatient use);Pink MOST form placed in chart (order not valid for inpatient use) Pink MOST form placed in chart (order not valid for inpatient use);Yellow form placed in chart (order not valid for inpatient use) Yellow form placed in chart (order not valid for inpatient use);Pink MOST form placed in chart (order not valid for inpatient use) - Pink MOST form placed in chart (order not valid for inpatient use);Yellow form placed in chart (order not valid for  inpatient use) Pink MOST form placed in chart (order not valid for inpatient use);Yellow form placed in chart (order not valid for inpatient use)    Current Medications (verified) Outpatient Encounter Medications as of 12/24/2020  Medication Sig   acetaminophen (TYLENOL) 325 MG tablet Take 650 mg by mouth every 8 (eight) hours as needed.   docusate sodium (COLACE) 100 MG capsule Take 100 mg by mouth daily as needed for mild constipation.   fluticasone (FLONASE) 50 MCG/ACT nasal spray Place 1 spray into both nostrils daily as needed for allergies or rhinitis.   isosorbide mononitrate (IMDUR) 30 MG 24 hr tablet Take 15 mg by mouth daily.   lactose free nutrition (BOOST) LIQD Take 237 mLs by mouth 2 (two) times daily between meals.   LORazepam (ATIVAN) 1 MG tablet Take 1 mg by mouth daily as needed for anxiety.   mirtazapine (REMERON) 7.5 MG tablet Take 15 mg by mouth.   Multiple Vitamins-Minerals (MULTIVITAMIN PO) Take 1 tablet by mouth daily.   Multiple Vitamins-Minerals (PRESERVISION AREDS 2 PO) Take 1 capsule by mouth daily.   polyethylene glycol (MIRALAX / GLYCOLAX) 17 g packet Take 17 g by mouth daily as needed.   tamsulosin (FLOMAX) 0.4 MG CAPS capsule Take 0.8 mg by mouth at bedtime.   warfarin (COUMADIN) 3 MG tablet Take 3 mg by mouth. Once A Day on Mon, Wed, Fri   warfarin (COUMADIN) 4 MG tablet Take 4 mg  by mouth daily. SAT/SUN/TUE/THUR   zinc oxide 20 % ointment Apply 1 application topically as needed for irritation.   No facility-administered encounter medications on file as of 12/24/2020.    Allergies (verified) Flexeril [cyclobenzaprine]   History: Past Medical History:  Diagnosis Date   Anemia    leakoppenia   BPH (benign prostatic hypertrophy)    Bullous pemphigoid    [name], March 2011, right forearm squamous cell carcinoma   Chronic anticoagulation    systemic   Colon polyp    transverse, 2002   History of peptic ulcer    remote, 3/95   Hx of actinic  keratosis    Hx of basal cell carcinoma    Hx of squamous cell carcinoma of skin    Hyperlipidemia    Left inguinal hernia    Moderate aortic insufficiency 2009   audible aortic insufficiency on 1/09 echo   PAF (paroxysmal atrial fibrillation) ([place]) 01/17/2014   On Warfarin.   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 Jude mechanical valve   ORIF FEMUR FRACTURE Left 08/28/2020   Procedure: OPEN REDUCTION INTERNAL FIXATION (ORIF) DISTAL FEMUR FRACTURE;  Surgeon: Rod Can, MD;  Location: [place];  Service: Orthopedics;  Laterality: Left;   TOTAL HIP ARTHROPLASTY Right 10/12/2017   Procedure: RIGHT TOTAL HIP ARTHROPLASTY ANTERIOR APPROACH;  Surgeon: Gaynelle Arabian, MD;  Location: [facility];  Service: Orthopedics;  Laterality: Right;   TRANSTHORACIC ECHOCARDIOGRAM  12/2018   Unable to assess diastolic function because of A. fib. Normal RV function, but moderately elevated RVSP.  Severe biatrial enlargement. S/P St Jude bileaflet mechanical MVR that appears to be functioning normally. Mitral valve regurgitation cannot assess due to mechanical valve shadowing. MV Mean grad: 7.0 mmHg MV Area (PHT): 3.38 cm (stable for valve).  Mild Ao Sclerosis, Mild-Mod AI   TRANSTHORACIC ECHOCARDIOGRAM  08/'17; 10/'18    a) Mild conc LVH. EF 55-60%. No RWMA. Mod AI. Mechanical MV prosthesis functioning properly. LAD dilation.;; b)  EF 55-60%.  Mo AI.  Bileaflet Saint Jude mechanical MV with no paravalvular leak.  Severe LA dilation.  Minimally elevated PAP   Family History  Problem Relation Age of Onset   Hypertension Mother    Lung  cancer Sister    COPD Brother    Cancer Brother    Other Sister        polio   Lupus Son    Social History   Socioeconomic History   Marital status: Married    Spouse name: Not on file   Number of children: 2   Years of education: Not on file   Highest education level: Not on file  Occupational History   Occupation: retired  Tobacco Use   Smoking status: Former    Packs/day: 1.00    Years: 13.00    Pack years: 13.00    Types: Cigarettes    Start date: 1947    Quit date: 1960    Years since quitting: 62.5   Smokeless tobacco: Never  Vaping Use   Vaping Use: Never used  Substance and Sexual Activity   Alcohol use: Yes    Alcohol/week: 0.0 standard drinks    Comment: 1-2 drinks per day   Drug use: Never   Sexual activity: Not on file  Other Topics Concern   Not on file  Social History Narrative   Patient lives at [facility], With his wife - Meryl Crutch.   Social Determinants of Health   Financial Resource Strain: Not on file  Food Insecurity: Not on file  Transportation Needs: Not on file  Physical Activity: Not on file  Stress: Not on file  Social Connections: Not on file    Tobacco Counseling Counseling given: Not Answered   Clinical Intake:  Pre-visit preparation completed: Yes Pain : No/denies pain BMI - recorded: 20.24 Nutritional Status: BMI of 19-24  Normal Nutritional Risks: Unintentional weight loss Diabetes: No  How often do you need to have someone help you when you read instructions, pamphlets, or other written materials from your doctor or pharmacy?: 3 - Sometimes What is the last grade level you completed in school?: college  Diabetic?No  Interpreter Needed?: No      Activities of Daily Living In your present state of health, do you have any difficulty performing the following activities: 12/24/2020 09/02/2020  Hearing? N N  Vision? N N  Difficulty concentrating or making decisions? N N  Walking or climbing stairs? Y Y  Dressing  or bathing? Y Y  Doing errands, shopping? [place]  Preparing Food and eating ? Y -  Using the Toilet? N -  In the past six months, have you accidently leaked urine? Y -  Do you have problems with loss of bowel control? Y -  Managing your Medications? Y -  Managing your Finances? Y -  Housekeeping or managing your Housekeeping? Y -  Some recent data might be hidden    Patient Care Team: Virgie Dad, MD as PCP - General (Internal Medicine)  Indicate any recent Medical Services you may have received from other than [facility] providers in the past year (date may be approximate).     Assessment:   This is a routine wellness examination for [place].  Hearing/Vision screen No results found.  Dietary issues and exercise activities discussed: Current Exercise Habits: Structured exercise class, Type of exercise: strength training/weights;walking, Time (Minutes): 25, Frequency (Times/Week): 4, Weekly Exercise (Minutes/Week): 100, Intensity: Moderate, Exercise limited by: orthopedic condition(s)   Goals Addressed             This Visit's Progress    DIET - INCREASE WATER INTAKE         Depression Screen PHQ 2/9 Scores 12/24/2020 12/24/2020 10/31/2014  PHQ - 2 Score 0 0 0    Fall Risk Fall Risk  12/24/2020 12/24/2020  Falls in the past year? 1 1  Number falls in past yr: 1 1  Injury with Fall? 1 1  Risk for fall due to : History of fall(s);Impaired balance/gait;Impaired mobility -  Follow up Falls evaluation completed;Education provided;Falls prevention discussed -    FALL RISK PREVENTION PERTAINING TO THE HOME:  Any stairs in or around the home? No  If so, are there any without handrails? No  Home free of loose throw rugs in walkways, pet beds, electrical cords, etc? Yes  Adequate lighting in your home to reduce risk of falls? Yes   ASSISTIVE DEVICES UTILIZED TO PREVENT FALLS:  Life alert? No  Use of a cane, walker or w/c? Yes  Grab bars in the  bathroom? Yes  Shower chair or  bench in shower? Yes  Elevated toilet seat or a handicapped toilet? Yes   TIMED UP AND GO:  Was the test performed? No .  Length of time to ambulate 10 feet:sec.   Gait slow and steady with assistive device  Cognitive Function: MMSE - Mini Mental State Exam 12/24/2020  Not completed: Refused     6CIT Screen 12/24/2020  What Year? 0 points  What month? 0 points  What time? 0 points  Count back from 20 0 points  Months in reverse 0 points  Repeat phrase 0 points  Total Score 0    Immunizations Immunization History  Administered Date(s) Administered   Influenza, High Dose Seasonal PF 04/20/2016   Influenza,inj,Quad PF,6+ Mos 03/28/2018   Influenza-Unspecified 03/30/2017   Moderna SARS-COV2 Booster Vaccination 12/03/2020   Moderna Sars-Covid-2 Vaccination 07/02/2019, 07/30/2019, 05/12/2020   Pneumococcal Conjugate-13 06/10/2014   Pneumococcal-Unspecified 02/02/2006   Tdap 06/10/2014   Zoster, Live 03/29/2015    TDAP status: Up to date  Flu Vaccine status: Up to date  Pneumococcal vaccine status: Up to date  Covid-19 vaccine status: Completed vaccines  Qualifies for Shingles Vaccine? Yes   Zostavax completed Yes   Shingrix Completed?: No.    Education has been provided regarding the importance of this vaccine. Patient has been advised to call insurance company to determine out of pocket expense if they have not yet received this vaccine. Advised may also receive vaccine at local pharmacy or Health Dept. Verbalized acceptance and understanding.  Screening Tests Health Maintenance  Topic Date Due   Zoster Vaccines- Shingrix (1 of 2) Never done   INFLUENZA VACCINE  01/26/2021   COVID-19 Vaccine (5 - Booster for Moderna series) 04/04/2021   TETANUS/TDAP  06/10/2024   PNA vac Low Risk Adult  Completed   HPV VACCINES  Aged Out    Health Maintenance  Health Maintenance Due  Topic Date Due   Zoster Vaccines- Shingrix (1 of 2) Never done    Colorectal cancer  screening: No longer required.   Lung Cancer Screening: (Low Dose CT Chest recommended if Age 76-80 years, 30 pack-year currently smoking OR have quit w/in 15years.) does not qualify.   Lung Cancer Screening Referral: No  Additional Screening:  Hepatitis C Screening: does not qualify; Completed   Vision Screening: Recommended annual ophthalmology exams for early detection of glaucoma and other disorders of the eye. Is the patient up to date with their annual eye exam?  Yes  Who is the provider or what is the name of the office in which the patient attends annual eye exams? Cannot recall name If pt is not established with a provider, would they like to be referred to a provider to establish care? No .   Dental Screening: Recommended annual dental exams for proper oral hygiene  Community Resource Referral / Chronic Care Management: CRR required this visit?  No   CCM required this visit?  No      Plan:     I have personally reviewed and noted the following in the patient's chart:   Medical and social history Use of alcohol, tobacco or illicit drugs  Current medications and supplements including opioid prescriptions. Patient is not currently taking opioid prescriptions. Functional ability and status Nutritional status Physical activity Advanced directives List of other physicians Hospitalizations, surgeries, and ER visits in previous 12 months Vitals Screenings to include cognitive, depression, and falls Referrals and appointments  In addition, I have reviewed and  discussed with patient certain preventive protocols, quality metrics, and best practice recommendations. A written personalized care plan for preventive services as well as general preventive health recommendations were provided to patient.     Yvonna Alanis, NP   12/24/2020

## 2020-12-24 NOTE — Progress Notes (Signed)
Provider:   Location:     Place of Service:      PCP: Virgie Dad, MD Patient Care Team: Virgie Dad, MD as PCP - General (Internal Medicine)  Extended Emergency Contact Information Primary Emergency Contact: Meet, [place] Mobile Phone: [phone] Relation: Son Secondary Emergency Contact: [facility] Address: [address]          [place],  [place] Home Phone: [phone] Relation: Spouse  Code Status:  Goals of Care: Advanced Directive information Advanced Directives 12/23/2020  Does Patient Have a Medical Advance Directive? Yes  Type of Advance Directive Living will;Out of facility DNR (pink MOST or yellow form)  Does patient want to make changes to medical advance directive? No - Patient declined  Copy of [place] in Chart? -  Would patient like information on creating a medical advance directive? -  Pre-existing out of facility DNR order (yellow form or pink MOST form) Yellow form placed in chart (order not valid for inpatient use);Pink MOST form placed in chart (order not valid for inpatient use)     Chief Complaint  Patient presents with   Annual Exam    Patient presents for  annual wellness visit.    HPI: Patient is a 87 y.o. male seen today for an annual comprehensive examination.  Past Medical History:  Diagnosis Date   Anemia    leakoppenia   BPH (benign prostatic hypertrophy)    Bullous pemphigoid    [name], March 2011, right forearm squamous cell carcinoma   Chronic anticoagulation    systemic   Colon polyp    transverse, 2002   History of peptic ulcer    remote, 3/95   Hx of actinic keratosis    Hx of basal cell carcinoma    Hx of squamous cell carcinoma of skin    Hyperlipidemia    Left inguinal hernia    Moderate aortic insufficiency 2009   audible aortic insufficiency on 1/09 echo   PAF (paroxysmal atrial fibrillation) ([place]) 01/17/2014   On Warfarin.   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 Jude mechanical valve   ORIF FEMUR FRACTURE Left 08/28/2020   Procedure: OPEN REDUCTION INTERNAL FIXATION (ORIF) DISTAL FEMUR FRACTURE;  Surgeon: Rod Can, MD;  Location: [place];  Service: Orthopedics;  Laterality: Left;   TOTAL HIP ARTHROPLASTY Right 10/12/2017   Procedure: RIGHT TOTAL HIP ARTHROPLASTY ANTERIOR APPROACH;  Surgeon: Gaynelle Arabian, MD;  Location[facility];  Service: Orthopedics;  Laterality: Right;   TRANSTHORACIC ECHOCARDIOGRAM  12/2018   Unable to assess diastolic function because of A. fib. Normal RV function, but moderately elevated RVSP.  Severe biatrial enlargement. S/P St Jude bileaflet mechanical MVR that appears to be functioning normally. Mitral valve regurgitation cannot assess due to mechanical valve shadowing. MV Mean grad: 7.0 mmHg MV Area (PHT): 3.38 cm (stable for valve).  Mild Ao Sclerosis, Mild-Mod AI   TRANSTHORACIC ECHOCARDIOGRAM  08/'17; 10/'18    a) Mild conc LVH. EF 55-60%. No RWMA. Mod AI. Mechanical MV prosthesis functioning properly. LAD dilation.;; b)  EF 55-60%.  Mo AI.  Bileaflet Saint Jude mechanical MV with no paravalvular leak.  Severe LA dilation.  Minimally elevated PAP    reports that he quit smoking about 62 years ago. His smoking use included cigarettes. He started smoking about 75 years ago. He has a 13.00 pack-year smoking history. He has never used smokeless tobacco. He reports current alcohol use. He reports that he does not use drugs. Social History   Socioeconomic History   Marital status: Married    Spouse name: Not on file   Number of children: 2    Years of education: Not on file   Highest education level: Not on file  Occupational History   Occupation: retired  Tobacco Use   Smoking status: Former    Packs/day: 1.00    Years: 13.00    Pack years: 13.00    Types: Cigarettes    Start date: 1947    Quit date: 1960    Years since quitting: 62.5   Smokeless tobacco: Never  Vaping Use   Vaping Use: Never used  Substance and Sexual Activity   Alcohol use: Yes    Alcohol/week: 0.0 standard drinks    Comment: 1-2 drinks per day   Drug use: Never   Sexual activity: Not on file  Other Topics Concern   Not on file  Social History Narrative   Patient lives at [facility], With his wife - Meryl Crutch.   Social Determinants of Health   Financial Resource Strain: Not on file  Food Insecurity: Not on file  Transportation Needs: Not on file  Physical Activity: Not on file  Stress: Not on file  Social Connections: Not on file  Intimate Partner Violence: Not on file   Family History  Problem Relation Age of Onset   Hypertension Mother    Lung cancer Sister    COPD Brother    Cancer Brother    Other Sister        polio   Lupus Son     Pertinent  Health Maintenance Due  Topic Date Due   INFLUENZA VACCINE  01/26/2021   PNA vac Low Risk Adult  Completed   No flowsheet data found. Depression screen PHQ 2/9 10/31/2014  Decreased Interest 0  Down, Depressed, Hopeless 0  PHQ - 2 Score 0    Functional Status Survey:    Allergies  Allergen Reactions   Flexeril [Cyclobenzaprine] Diarrhea    Allergies as of 12/24/2020       Reactions   Flexeril [cyclobenzaprine] Diarrhea        Medication List        Accurate as of December 24, 2020 10:40 AM. If you have any questions, ask your nurse or doctor.          acetaminophen 325 MG tablet Commonly known as: TYLENOL Take 650 mg by mouth every 8 (eight) hours as needed.   docusate sodium 100 MG capsule Commonly known as: COLACE Take 100 mg by mouth daily as  needed for mild constipation.   fluticasone 50 MCG/ACT nasal spray Commonly known as: FLONASE Place 1 spray into both nostrils daily as needed for allergies or rhinitis.   isosorbide mononitrate 30 MG 24 hr tablet Commonly known as: IMDUR Take 15 mg by mouth daily.   lactose free nutrition Liqd Take 237 mLs by mouth 2 (two) times daily between meals.   LORazepam 1 MG tablet Commonly known as: ATIVAN Take 1 mg by mouth daily as needed for anxiety.  mirtazapine 7.5 MG tablet Commonly known as: REMERON Take 15 mg by mouth.   MULTIVITAMIN PO Take 1 tablet by mouth daily.   polyethylene glycol 17 g packet Commonly known as: MIRALAX / GLYCOLAX Take 17 g by mouth daily as needed.   PRESERVISION AREDS 2 PO Take 1 capsule by mouth daily.   tamsulosin 0.4 MG Caps capsule Commonly known as: FLOMAX Take 0.8 mg by mouth at bedtime.   warfarin 4 MG tablet Commonly known as: COUMADIN Take 4 mg by mouth daily. SAT/SUN/TUE/THUR   warfarin 3 MG tablet Commonly known as: COUMADIN Take 3 mg by mouth. Once A Day on Mon, Wed, Fri   zinc oxide 20 % ointment Apply 1 application topically as needed for irritation.        Review of Systems  Vitals:   12/24/20 1036  BP: (!) 108/53  Pulse: 85  Resp: 18  Temp: 97.7 F (36.5 C)  SpO2: 99%  Weight: 149 lb 3.2 oz (67.7 kg)  Height: 6' (1.829 m)   Body mass index is 20.24 kg/m. Physical Exam  Labs reviewed: Basic Metabolic Panel: Recent Labs    08/31/20 0111 09/01/20 0247 09/02/20 0130 09/08/20 0000 09/23/20 0000 10/15/20 0000 10/23/20 1035  NA 133* 133* 134*   < > 131* 136* 134*  K 3.8 3.7 4.0   < > 4.1 4.0 4.6  CL 102 103 104   < > 99 102 104  CO2 24 23 22    < > 24* 23* 23*  GLUCOSE 104* 88 97  --   --   --   --   BUN 26* 27* 25*   < > 33* 53* 33*  CREATININE 1.00 0.99 1.05   < > 1.0 1.4* 1.2  CALCIUM 7.9* 7.9* 8.0*   < > 8.6* 8.8 8.6*  MG 2.4 2.2 2.1  --   --   --   --    < > = values in this interval not  displayed.   Liver Function Tests: Recent Labs    08/25/20 1832 08/26/20 0332 09/08/20 0000 09/23/20 0000 10/15/20 0000 10/23/20 1035  AST 39 34   < > 38 122* 50*  ALT 24 21   < > 38 164* 96*  ALKPHOS 54 42   < > 93 95 77  BILITOT 1.1 1.1  --   --   --   --   PROT 5.8* 5.3*  --   --   --   --   ALBUMIN 3.4* 2.9*   < > 3.1* 3.3* 5.7*   < > = values in this interval not displayed.   No results for input(s): LIPASE, AMYLASE in the last 8760 hours. No results for input(s): AMMONIA in the last 8760 hours. CBC: Recent Labs    08/31/20 0111 09/01/20 0247 09/02/20 0130 09/08/20 0000 09/23/20 0000 10/15/20 0000  WBC 4.4 4.0 4.0 4.9 3.1 6.0  NEUTROABS 3.1  --  2.7 3,729.00 2,062.00  --   HGB 10.4* 10.0* 9.7* 10.8* 10.8* 11.4*  HCT 31.3* 29.1* 28.1* 32* 33* 35*  MCV 93.4 92.4 92.7  --   --   --   PLT 122* 144* 164 366 183 248   Cardiac Enzymes: No results for input(s): CKTOTAL, CKMB, CKMBINDEX, TROPONINI in the last 8760 hours. BNP: Invalid input(s): POCBNP No results found for: HGBA1C Lab Results  Component Value Date   TSH 2.39 10/15/2020   Lab Results  Component Value Date   VITAMINB12 478 08/25/2020  Lab Results  Component Value Date   FOLATE 44.0 08/25/2020   Lab Results  Component Value Date   IRON 60 08/25/2020   TIBC 413 08/25/2020    Imaging and Procedures obtained recently: No results found.  Assessment/Plan There are no diagnoses linked to this encounter.   Family/ staff Communication:   Labs/tests ordered:

## 2020-12-24 NOTE — Patient Instructions (Signed)
  Richard Spears , Thank you for taking time to come for your Medicare Wellness Visit. I appreciate your ongoing commitment to your health goals. Please review the following plan we discussed and let me know if I can assist you in the future.   These are the goals we discussed:  Goals      DIET - INCREASE WATER INTAKE        This is a list of the screening recommended for you and due dates:  Health Maintenance  Topic Date Due   Zoster (Shingles) Vaccine (1 of 2) Never done   Flu Shot  01/26/2021   COVID-19 Vaccine (5 - Booster for Moderna series) 04/04/2021   Tetanus Vaccine  06/10/2024   Pneumonia vaccines  Completed   HPV Vaccine  Aged Out

## 2020-12-30 ENCOUNTER — Telehealth: Payer: Self-pay

## 2020-12-30 NOTE — Telephone Encounter (Signed)
Message left on clinical intake voicemail:   Patient's wife left a message stating her husband would like to see Dr.Griffin who he was previous established with and questions if Dr.Gupta would be ok with that.  I returned call and left message explaining to Mrs.Rafferty that her husband has the right to see which ever provide he prefers. I asked that Mrs.Want return the call to clarify if this will be a permanent change, in which we would need to remove Dr.Gupta as his pcp or was this going  to be a one time decision.  I also mentioned that she may run into an issue with her insurance company not covering visit from 2 primary care providers, if this is a one time decision and that decision would need to be made based on her own discretion.

## 2021-01-01 NOTE — Telephone Encounter (Signed)
Meryl Crutch, wife, called and stated that she appreciates the return call and information.  Stated that patient is much better right now and have decided not to take him to Dr. Laurann Montana at this time.

## 2021-01-02 ENCOUNTER — Telehealth: Payer: Self-pay | Admitting: *Deleted

## 2021-01-02 NOTE — Telephone Encounter (Signed)
Patient son, Alferd Apa, called and stated that he would like to speak with you regarding patient's Case. Stated that he has spoken with you in the past.   Son is wanting you to call him at [phone]

## 2021-01-05 ENCOUNTER — Other Ambulatory Visit: Payer: Self-pay | Admitting: *Deleted

## 2021-01-05 MED ORDER — LORAZEPAM 1 MG PO TABS: 1.0000 mg | ORAL_TABLET | Freq: Every day | ORAL | 0 refills | Status: DC | PRN

## 2021-01-05 NOTE — Telephone Encounter (Signed)
[place] requested refill Pended Rx and sent to [facility] for approval.

## 2021-01-08 ENCOUNTER — Encounter: Payer: Self-pay | Admitting: Internal Medicine

## 2021-01-08 ENCOUNTER — Non-Acute Institutional Stay (SKILLED_NURSING_FACILITY): Payer: Medicare Other | Admitting: Internal Medicine

## 2021-01-08 DIAGNOSIS — Z8781 Personal history of (healed) traumatic fracture: Secondary | ICD-10-CM | POA: Diagnosis not present

## 2021-01-08 DIAGNOSIS — R634 Abnormal weight loss: Secondary | ICD-10-CM | POA: Diagnosis not present

## 2021-01-08 DIAGNOSIS — I4811 Longstanding persistent atrial fibrillation: Secondary | ICD-10-CM | POA: Diagnosis not present

## 2021-01-08 DIAGNOSIS — F419 Anxiety disorder, unspecified: Secondary | ICD-10-CM | POA: Diagnosis not present

## 2021-01-08 DIAGNOSIS — Z952 Presence of prosthetic heart valve: Secondary | ICD-10-CM

## 2021-01-08 DIAGNOSIS — N1831 Chronic kidney disease, stage 3a: Secondary | ICD-10-CM | POA: Diagnosis not present

## 2021-01-08 DIAGNOSIS — Z9889 Other specified postprocedural states: Secondary | ICD-10-CM | POA: Diagnosis not present

## 2021-01-08 DIAGNOSIS — N400 Enlarged prostate without lower urinary tract symptoms: Secondary | ICD-10-CM

## 2021-01-08 DIAGNOSIS — R600 Localized edema: Secondary | ICD-10-CM

## 2021-01-08 NOTE — Progress Notes (Signed)
Location:   [place] Room Number: 29 Place of Service:  SNF [phone]) Provider:  Veleta Miners MD  Virgie Dad, MD  Patient Care Team: Virgie Dad, MD as PCP - General (Internal Medicine)  Extended Emergency Contact Information Primary Emergency Contact: Caylon, Saine Mobile Phone: [phone] Relation: Son Secondary Emergency Contact: [facility] Address: [address]          [place]  [place] Home Phone: [phone] Relation: Spouse  Code Status:  Full Code Goals of care: Advanced Directive information Advanced Directives 01/08/2021  Does Patient Have a Medical Advance Directive? Yes  Type of Advance Directive Living will;Out of facility DNR (pink MOST or yellow form)  Does patient want to make changes to medical advance directive? No - Patient declined  Copy of [place] in Chart? -  Would patient like information on creating a medical advance directive? -  Pre-existing out of facility DNR order (yellow form or pink MOST form) Yellow form placed in chart (order not valid for inpatient use);Pink MOST form placed in chart (order not valid for inpatient use)     Chief Complaint  Patient presents with   Medical Management of Chronic Issues   Health Maintenance    Shingrix    HPI:  Pt is a 87 y.o. male seen today for medical management of chronic diseases.    Admitted in the hospital from 2/28-3/8 Closed Periprosthetic displaced fracture of distal epiphysis of left femur ORIF on 03/03 Patient has a history of PAF, mechanical mitral valve is 97 on Coumadin, BPH, previous history of right and left hip arthroplasty, hypertension.  Has been in SNF since then.  Initially was not making much Progress but is now doing well. Per Nurses and therapist he is Walking with Mild Assist Is able to do Transfers by himself. Dressing and getting independent in his ADLS Still needs helps with Ambulation And With his  cleaning , Showers and Toileting Patient seems to be in good mood. Seems Motivated. His issue still is poor Appetite. His weigh has stabilized but still lower then his baseline weight No pain no Dysuria  He was having with Urinary Retention but better since he is more Ambulatory No Other issues Noticed today Plans to eventually go back to his apartment with his Wife     Past Medical History:  Diagnosis Date   Anemia    leakoppenia   BPH (benign prostatic hypertrophy)    Bullous pemphigoid    [name], March 2011, right forearm squamous cell carcinoma   Chronic anticoagulation    systemic   Colon polyp    transverse, 2002   History of peptic ulcer    remote, 3/95   Hx of actinic keratosis    Hx of basal cell carcinoma    Hx of squamous cell carcinoma of skin    Hyperlipidemia    Left inguinal hernia    Moderate aortic insufficiency 2009   audible aortic insufficiency on 1/09 echo   PAF (paroxysmal atrial fibrillation) ([place]) 01/17/2014   On Warfarin.   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 Jude mechanical valve   ORIF FEMUR FRACTURE Left 08/28/2020   Procedure: OPEN REDUCTION INTERNAL FIXATION (ORIF) DISTAL FEMUR FRACTURE;  Surgeon: Rod Can, MD;  Location: [place];  Service: Orthopedics;  Laterality: Left;   TOTAL HIP ARTHROPLASTY Right 10/12/2017   Procedure: RIGHT TOTAL HIP ARTHROPLASTY ANTERIOR APPROACH;  Surgeon: Gaynelle Arabian, MD;  Location: [facility];  Service: Orthopedics;  Laterality: Right;   TRANSTHORACIC ECHOCARDIOGRAM  12/2018   Unable to assess diastolic function because of A.  fib. Normal RV function, but moderately elevated RVSP.  Severe biatrial enlargement. S/P St Jude bileaflet mechanical MVR that appears to be functioning normally. Mitral valve regurgitation cannot assess due to mechanical valve shadowing. MV Mean grad: 7.0 mmHg MV Area (PHT): 3.38 cm (stable for valve).  Mild Ao Sclerosis, Mild-Mod AI   TRANSTHORACIC ECHOCARDIOGRAM  08/'17; 10/'18    a) Mild conc LVH. EF 55-60%. No RWMA. Mod AI. Mechanical MV prosthesis functioning properly. LAD dilation.;; b)  EF 55-60%.  Mo AI.  Bileaflet Saint Jude mechanical MV with no paravalvular leak.  Severe LA dilation.  Minimally elevated PAP    Allergies  Allergen Reactions   Flexeril [Cyclobenzaprine] Diarrhea    Allergies as of 01/08/2021       Reactions   Flexeril [cyclobenzaprine] Diarrhea        Medication List        Accurate as of January 08, 2021  1:27 PM. If you have any questions, ask your nurse or doctor.          acetaminophen 500 MG tablet Commonly known as: TYLENOL Take 1,000 mg by mouth 3 (three) times daily as needed.   docusate sodium 100 MG capsule Commonly known as: COLACE Take 100 mg by mouth daily as needed for mild constipation.   fluticasone 50 MCG/ACT nasal spray Commonly known as: FLONASE Place 1 spray into both nostrils daily as needed for allergies or rhinitis.   isosorbide mononitrate 30 MG 24 hr tablet Commonly known as: IMDUR Take 15 mg by mouth daily.   lactose free nutrition Liqd Take 237 mLs by mouth 2 (two) times daily between meals.   LORazepam 1 MG tablet Commonly known as: ATIVAN Take 1 tablet (1 mg total) by mouth daily as needed for anxiety.   mirtazapine 7.5 MG tablet Commonly known as: REMERON Take 15 mg by mouth.   MULTIVITAMIN PO Take 1 tablet by mouth daily.   polyethylene glycol 17 g packet Commonly known as: MIRALAX / GLYCOLAX Take 17 g by mouth daily as needed.   PRESERVISION AREDS 2 PO Take 1 capsule by mouth daily.   tamsulosin  0.4 MG Caps capsule Commonly known as: FLOMAX Take 0.8 mg by mouth at bedtime.   warfarin 4 MG tablet Commonly known as: COUMADIN Take 4 mg by mouth daily. SAT/SUN/TUE/THUR   warfarin 3 MG tablet Commonly known as: COUMADIN Take 3 mg by mouth. Once A Day on Mon, Wed, Fri   zinc oxide 20 % ointment Apply 1 application topically as needed for irritation.        Review of Systems Review of Systems  Constitutional: Negative for activity change, appetite change, chills, diaphoresis, fatigue and fever.  HENT: Negative for mouth sores, postnasal drip, rhinorrhea, sinus pain and sore throat.   Respiratory: Negative for apnea, cough, chest tightness, shortness of breath and wheezing.   Cardiovascular: Negative for chest pain, palpitations and leg swelling.  Gastrointestinal: Negative for  abdominal distention, abdominal pain, constipation, diarrhea, nausea and vomiting.  Genitourinary: Negative for dysuria and frequency.  Musculoskeletal: Negative for arthralgias, joint swelling and myalgias.  Skin: Negative for rash.  Neurological: Negative for dizziness, syncope, weakness, light-headedness and numbness.  Psychiatric/Behavioral: Negative for behavioral problems, confusion and sleep disturbance.    Immunization History  Administered Date(s) Administered   Influenza, High Dose Seasonal PF 04/20/2016   Influenza,inj,Quad PF,6+ Mos 03/28/2018   Influenza-Unspecified 03/30/2017   Moderna SARS-COV2 Booster Vaccination 12/03/2020   Moderna Sars-Covid-2 Vaccination 07/02/2019, 07/30/2019, 05/12/2020   Pneumococcal Conjugate-13 06/10/2014   Pneumococcal-Unspecified 02/02/2006   Tdap 06/10/2014   Zoster, Live 03/29/2015   Zoster, Unspecified 05/02/2015   Pertinent  Health Maintenance Due  Topic Date Due   INFLUENZA VACCINE  01/26/2021   PNA vac Low Risk Adult  Completed   Fall Risk  12/24/2020 12/24/2020  Falls in the past year? 1 1  Number falls in past yr: 1 1  Injury with Fall? 1  1  Risk for fall due to : History of fall(s);Impaired balance/gait;Impaired mobility -  Follow up Falls evaluation completed;Education provided;Falls prevention discussed -   Functional Status Survey:    Vitals:   01/08/21 1316  BP: (!) 106/58  Pulse: (!) 104  Resp: 20  Temp: 97.7 F (36.5 C)  SpO2: 96%  Weight: 148 lb 4.8 oz (67.3 kg)  Height: 6' (1.829 m)   Body mass index is 20.11 kg/m. Physical Exam Constitutional: Oriented to person, place, and time. Well-developed  Is frail.  HENT:  Head: Normocephalic.  Mouth/Throat: Oropharynx is clear and moist.  Eyes: Pupils are equal, round, and reactive to light.  Neck: Neck supple.  Cardiovascular: Normal rate and normal heart sounds.  Murmur Present Pulmonary/Chest: Effort normal and breath sounds normal. No respiratory distress. No wheezes. has no rales.  Abdominal: Soft. Bowel sounds are normal. No distension. There is no tenderness. There is no rebound.  Musculoskeletal: Edema With Chronic Venous Changes Bilateral Left More then right Lymphadenopathy: none Neurological: Alert and oriented to person, place, and time.  Skin: Skin is warm and dry.  Psychiatric: Normal mood and affect. Behavior is normal. Thought content normal.   Labs reviewed: Recent Labs    08/31/20 0111 09/01/20 0247 09/02/20 0130 09/08/20 0000 09/23/20 0000 10/15/20 0000 10/23/20 1035  NA 133* 133* 134*   < > 131* 136* 134*  K 3.8 3.7 4.0   < > 4.1 4.0 4.6  CL 102 103 104   < > 99 102 104  CO2 24 23 22    < > 24* 23* 23*  GLUCOSE 104* 88 97  --   --   --   --   BUN 26* 27* 25*   < > 33* 53* 33*  CREATININE 1.00 0.99 1.05   < > 1.0 1.4* 1.2  CALCIUM 7.9* 7.9* 8.0*   < > 8.6* 8.8 8.6*  MG 2.4 2.2 2.1  --   --   --   --    < > = values in this interval not displayed.   Recent Labs    08/25/20 1832 08/26/20 0332 09/08/20 0000 09/23/20 0000 10/15/20 0000 10/23/20 1035  AST 39 34   < > 38 122* 50*  ALT 24 21   < > 38 164* 96*  ALKPHOS 54  42   < > 93 95 77  BILITOT 1.1 1.1  --   --   --   --   PROT 5.8* 5.3*  --   --   --   --  ALBUMIN 3.4* 2.9*   < > 3.1* 3.3* 5.7*   < > = values in this interval not displayed.   Recent Labs    08/31/20 0111 09/01/20 0247 09/02/20 0130 09/08/20 0000 09/23/20 0000 10/15/20 0000  WBC 4.4 4.0 4.0 4.9 3.1 6.0  NEUTROABS 3.1  --  2.7 3,729.00 2,062.00  --   HGB 10.4* 10.0* 9.7* 10.8* 10.8* 11.4*  HCT 31.3* 29.1* 28.1* 32* 33* 35*  MCV 93.4 92.4 92.7  --   --   --   PLT 122* 144* 164 366 183 248   Lab Results  Component Value Date   TSH 2.39 10/15/2020   No results found for: HGBA1C No results found for: CHOL, HDL, LDLCALC, LDLDIRECT, TRIG, CHOLHDL  Significant Diagnostic Results in last 30 days:  No results found.  Assessment/Plan Weight loss On Remeron Continue Dietary Working [place] if Abdomen was negative More Work up if he continues with weight loss  S/P ORIF (open reduction internal fixation) fracture Patient is doing well. Making progress Therapy planning to do Trial at home in few weeks  S/P MVR (mitral valve replacement) Continue Coumadin  Benign prostatic hyperplasia without lower urinary tract symptoms On Flomax Per Urology continue in and Out Cath PRN Patient not doing it right now Stage 3a chronic kidney disease ([place]) Creat stable Longstanding persistent atrial fibrillation: CHA2DS2-VASc Score 3 On Coumadin Taken off Zebeta due to Low BP Leg edema Mild Was taken of Lasix when he was loosing weight Will consider if Edema gets worse Anxiety On Ativan PRn   Family/ staff Communication: Discussed with Son and Therapy  Labs/tests ordered:     Total time spent in this patient care encounter was  45_  minutes; greater than 50% of the visit spent counseling patient and staff, reviewing records , Labs and coordinating care for problems addressed at this encounter.

## 2021-01-30 ENCOUNTER — Encounter: Payer: Self-pay | Admitting: Orthopedic Surgery

## 2021-01-30 ENCOUNTER — Non-Acute Institutional Stay (SKILLED_NURSING_FACILITY): Payer: Medicare Other | Admitting: Orthopedic Surgery

## 2021-01-30 DIAGNOSIS — I4811 Longstanding persistent atrial fibrillation: Secondary | ICD-10-CM

## 2021-01-30 DIAGNOSIS — N1831 Chronic kidney disease, stage 3a: Secondary | ICD-10-CM | POA: Diagnosis not present

## 2021-01-30 DIAGNOSIS — C44702 Unspecified malignant neoplasm of skin of right lower limb, including hip: Secondary | ICD-10-CM | POA: Diagnosis not present

## 2021-01-30 DIAGNOSIS — R634 Abnormal weight loss: Secondary | ICD-10-CM | POA: Diagnosis not present

## 2021-01-30 DIAGNOSIS — Z8781 Personal history of (healed) traumatic fracture: Secondary | ICD-10-CM | POA: Diagnosis not present

## 2021-01-30 DIAGNOSIS — Z9889 Other specified postprocedural states: Secondary | ICD-10-CM | POA: Diagnosis not present

## 2021-01-30 DIAGNOSIS — N400 Enlarged prostate without lower urinary tract symptoms: Secondary | ICD-10-CM

## 2021-01-30 DIAGNOSIS — Z952 Presence of prosthetic heart valve: Secondary | ICD-10-CM | POA: Diagnosis not present

## 2021-01-30 DIAGNOSIS — R195 Other fecal abnormalities: Secondary | ICD-10-CM

## 2021-01-30 NOTE — Progress Notes (Signed)
Location:  [facility] [place] Room Number: 24 Place of Service:  SNF [phone]) Provider:  Windell Moulding, AGNP-C  Virgie Dad, MD  Patient Care Team: Virgie Dad, MD as PCP - General (Internal Medicine)  Extended Emergency Contact Information Primary Emergency Contact: [place], [place] Mobile Phone: [phone] Relation: Son Secondary Emergency Contact: [facility] Address: [address]          [place]  [place] Home Phone: [phone] Relation: Spouse  Code Status:   Goals of care: Advanced Directive information Advanced Directives 01/08/2021  Does Patient Have a Medical Advance Directive? Yes  Type of Advance Directive Living will;Out of facility DNR (pink MOST or yellow form)  Does patient want to make changes to medical advance directive? No - Patient declined  Copy of [place] in Chart? -  Would patient like information on creating a medical advance directive? -  Pre-existing out of facility DNR order (yellow form or pink MOST form) Yellow form placed in chart (order not valid for inpatient use);Pink MOST form placed in chart (order not valid for inpatient use)     Chief Complaint  Patient presents with   Medical Management of Chronic Issues    Routine follow up visit   Health Maintenance    Zoster vaccine, Flu vaccine    HPI:  Pt is a 87 y.o. male seen today for medical management of chronic diseases.    He currently resides on the skilled nursing unit at [facility]. Past medical history includes: aortic insufficiency, HTN, atrial fibrillation, cardiomyopathy, constipation, BPH, hyperlipidemia, recent femur fracture and weight loss.  Initially admitted to skilled s/p left femur ORIF 02/28- 03/08. He was slow to progress at the beginning of his stay. He has slowly made progress and ambulating > 266f with walker. No recent falls or injuries. Reports some mild left knee pain after PT today.   He does not care for  the food offered. Continues to have a poor appetite. Family will bring food from outside.   Recent weights:  08/03- 151 lbs  0701- 150.4 lbs  06/01- 153.5 lbs  05/02- 140 lbs  Reports loose stools with Boost. He stopped drinking them 3 days ago and symptoms have improved.   [place]. Next care plan meeting 02/26/2021.   Recent blood pressures:  08/02- 105/53  07/26- 121/65  07/19- 124/58  Nurse does not report any concerns vitals stable.   Past Medical History:  Diagnosis Date   Anemia    leakoppenia   BPH (benign prostatic hypertrophy)    Bullous pemphigoid    DWilhemina Bonito March 2011, right forearm squamous cell carcinoma   Chronic anticoagulation    systemic   Colon polyp    transverse, 2002   History of peptic ulcer    remote, 3/95   Hx of actinic keratosis    Hx of basal cell carcinoma    Hx of squamous cell carcinoma of skin    Hyperlipidemia    Left inguinal hernia    Moderate aortic insufficiency 2009   audible aortic insufficiency on 1/09 echo   [place]   On Warfarin.   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 Jude mechanical valve   ORIF FEMUR FRACTURE Left 08/28/2020   Procedure: OPEN REDUCTION INTERNAL FIXATION (ORIF) DISTAL FEMUR FRACTURE;  Surgeon: Rod Can, MD;  Location: [facility] [place];  Service: Orthopedics;  Laterality: Left;   TOTAL HIP ARTHROPLASTY Right 10/12/2017   Procedure: RIGHT TOTAL HIP ARTHROPLASTY ANTERIOR APPROACH;  Surgeon: Gaynelle Arabian, MD;  Location: [facility];  Service:  Orthopedics;  Laterality: Right;   TRANSTHORACIC ECHOCARDIOGRAM  12/2018   Unable to assess diastolic function because of A. fib. Normal RV function, but moderately elevated RVSP.  Severe biatrial enlargement. S/P St Jude bileaflet mechanical MVR that appears to be functioning normally. Mitral valve regurgitation cannot assess due to mechanical valve shadowing. MV Mean grad: 7.0 mmHg MV Area (PHT): 3.38 cm (stable for valve).  Mild Ao Sclerosis, Mild-Mod AI   TRANSTHORACIC ECHOCARDIOGRAM  08/'17; 10/'18    a) Mild conc LVH. EF 55-60%. No RWMA. Mod AI. Mechanical MV prosthesis functioning properly. LAD dilation.;; b)  EF 55-60%.  Mo AI.  Bileaflet Saint Jude mechanical MV with no paravalvular leak.  Severe LA dilation.  Minimally elevated PAP    Allergies  Allergen Reactions   Flexeril [Cyclobenzaprine] Diarrhea    Outpatient Encounter Medications as of 01/30/2021  Medication Sig   acetaminophen (TYLENOL) 500 MG tablet Take 1,000 mg by mouth 3 (three) times daily as needed.   docusate sodium (COLACE) 100 MG capsule Take 100 mg by mouth daily as needed for mild constipation.   fluticasone (FLONASE) 50 MCG/ACT nasal spray Place 1 spray into both nostrils daily as needed for allergies or rhinitis.   isosorbide mononitrate (IMDUR) 30 MG 24 hr tablet Take 15 mg by mouth daily.   lactose free nutrition (BOOST) LIQD Take 237 mLs by mouth 2 (two) times daily between meals.   LORazepam (ATIVAN) 1 MG tablet Take 1 tablet (1 mg total) by mouth daily as needed for anxiety.   mirtazapine (REMERON) 7.5 MG tablet Take 15 mg by mouth.   Multiple Vitamins-Minerals (MULTIVITAMIN PO) Take 1 tablet by mouth daily.   Multiple Vitamins-Minerals (PRESERVISION AREDS 2 PO) Take 1 capsule by mouth daily.   polyethylene glycol (MIRALAX / GLYCOLAX) 17 g packet Take 17 g by mouth daily as needed.   tamsulosin (FLOMAX) 0.4 MG CAPS capsule Take 0.8 mg by mouth at bedtime.   warfarin (COUMADIN) 3 MG tablet Take 3 mg by  mouth. Once A Day on Mon, Wed, Fri   zinc oxide 20 % ointment Apply 1 application topically as needed for irritation.   [DISCONTINUED] warfarin (COUMADIN) 4 MG tablet Take 4 mg by mouth daily. SAT/SUN/TUE/THUR   No facility-administered encounter medications on file as of 01/30/2021.    Review of Systems  Constitutional:  Negative for activity change, appetite change, fatigue and fever.  HENT:  Negative for dental problem and sore throat.   Eyes:  Negative for visual disturbance.       Glasses  Respiratory:  Negative for cough, shortness of breath and wheezing.   Cardiovascular:  Positive for leg swelling. Negative for chest pain.  Gastrointestinal:  Negative for abdominal distention, abdominal pain, blood in stool, constipation, diarrhea and nausea.  Genitourinary:  Negative for dysuria, frequency and hematuria.  Musculoskeletal:  Positive for arthralgias, gait problem and myalgias.  Skin:  Positive  for wound.       Right sore in shin  Neurological:  Positive for weakness. Negative for dizziness and headaches.  Hematological:  Bruises/bleeds easily.  Psychiatric/Behavioral:  Negative for confusion, dysphoric mood and sleep disturbance. The patient is not nervous/anxious.    Immunization History  Administered Date(s) Administered   Influenza, High Dose Seasonal PF 04/20/2016   Influenza,inj,Quad PF,6+ Mos 03/28/2018   Influenza-Unspecified 03/30/2017   Moderna SARS-COV2 Booster Vaccination 12/03/2020   Moderna Sars-Covid-2 Vaccination 07/02/2019, 07/30/2019, 05/12/2020   Pneumococcal Conjugate-13 06/10/2014   Pneumococcal-Unspecified 02/02/2006   Tdap 06/10/2014   Zoster, Live 03/29/2015   Zoster, Unspecified 05/02/2015   Pertinent  Health Maintenance Due  Topic Date Due   INFLUENZA VACCINE  01/26/2021   PNA vac Low Risk Adult  Completed   Fall Risk  12/24/2020 12/24/2020  Falls in the past year? 1 1  Number falls in past yr: 1 1  Injury with Fall? 1 1  Risk for fall due to :  History of fall(s);Impaired balance/gait;Impaired mobility -  Follow up Falls evaluation completed;Education provided;Falls prevention discussed -   Functional Status Survey:    Vitals:   01/30/21 1426  BP: (!) 105/53  Pulse: 81  Resp: 20  Temp: (!) 97.3 F (36.3 C)  SpO2: 94%  Weight: 151 lb (68.5 kg)  Height: 6' (1.829 m)   Body mass index is 20.48 kg/m. Physical Exam Vitals reviewed.  Constitutional:      General: He is not in acute distress. HENT:     Head: Normocephalic.     Right Ear: There is no impacted cerumen.     Left Ear: There is no impacted cerumen.     Nose: Nose normal.     Mouth/Throat:     Mouth: Mucous membranes are moist.  Eyes:     General:        Right eye: No discharge.        Left eye: No discharge.  Cardiovascular:     Rate and Rhythm: Normal rate. Rhythm irregular.     Pulses: Normal pulses.     Heart sounds: Murmur heard.  Pulmonary:     Effort: Pulmonary effort is normal. No respiratory distress.     Breath sounds: Normal breath sounds. No wheezing.  Abdominal:     General: Bowel sounds are normal. There is no distension.     Palpations: Abdomen is soft.     Tenderness: There is no abdominal tenderness.  Musculoskeletal:     Cervical back: Normal range of motion.     Right lower leg: No edema.     Left lower leg: Edema present.     Comments: Non-pitting, bilateral lower extremities cool to touch, non tender, BLE appear bluish,   Lymphadenopathy:     Cervical: No cervical adenopathy.  Skin:    General: Skin is warm and dry.     Capillary Refill: Capillary refill takes less than 2 seconds.     Comments: Dime sized lesion to right anterior shin, irregular shape with crusted appearance, non tender.   Neurological:     General: No focal deficit present.     Mental Status: He is alert and oriented to person, place, and time.     Motor: Weakness present.     Gait: Gait abnormal.     Comments: walker  Psychiatric:        Mood and  Affect: Mood normal.        Behavior: Behavior normal.  Labs reviewed: Recent Labs    08/31/20 0111 09/01/20 0247 09/02/20 0130 09/08/20 0000 09/23/20 0000 10/15/20 0000 10/23/20 1035  NA 133* 133* 134*   < > 131* 136* 134*  K 3.8 3.7 4.0   < > 4.1 4.0 4.6  CL 102 103 104   < > 99 102 104  CO2 '24 23 22   '$ < > 24* 23* 23*  GLUCOSE 104* 88 97  --   --   --   --   BUN 26* 27* 25*   < > 33* 53* 33*  CREATININE 1.00 0.99 1.05   < > 1.0 1.4* 1.2  CALCIUM 7.9* 7.9* 8.0*   < > 8.6* 8.8 8.6*  MG 2.4 2.2 2.1  --   --   --   --    < > = values in this interval not displayed.   Recent Labs    08/25/20 1832 08/26/20 0332 09/08/20 0000 09/23/20 0000 10/15/20 0000 10/23/20 1035  AST 39 34   < > 38 122* 50*  ALT 24 21   < > 38 164* 96*  ALKPHOS 54 42   < > 93 95 77  BILITOT 1.1 1.1  --   --   --   --   PROT 5.8* 5.3*  --   --   --   --   ALBUMIN 3.4* 2.9*   < > 3.1* 3.3* 5.7*   < > = values in this interval not displayed.   Recent Labs    08/31/20 0111 09/01/20 0247 09/02/20 0130 09/08/20 0000 09/23/20 0000 10/15/20 0000  WBC 4.4 4.0 4.0 4.9 3.1 6.0  NEUTROABS 3.1  --  2.7 3,729.00 2,062.00  --   HGB 10.4* 10.0* 9.7* 10.8* 10.8* 11.4*  HCT 31.3* 29.1* 28.1* 32* 33* 35*  MCV 93.4 92.4 92.7  --   --   --   PLT 122* 144* 164 366 183 248   Lab Results  Component Value Date   TSH 2.39 10/15/2020   No results found for: HGBA1C No results found for: CHOL, HDL, LDLCALC, LDLDIRECT, TRIG, CHOLHDL  Significant Diagnostic Results in last 30 days:  No results found.  Assessment/Plan 1. S/P ORIF (open reduction internal fixation) fracture - ambulating > 250 ft with walker - [place] going [place] next care meeting 02/26/2021 - cbc/diff  2. S/P MVR (mitral valve replacement) - remains on coumadin  3. Weight loss - weight stable - he stopped drinking Boost recently due to loose stools - will notify dietary  4. Longstanding persistent atrial fibrillation:  CHA2DS2-VASc Score 3 - rate controlled without medication- Zebeta d/c a few months ago - remains on coumadin  5. Benign prostatic hyperplasia without lower urinary tract symptoms - followed by urology - stable with flomax  6. Stage 3a chronic kidney disease (HCC) - GFR 52 10/23/2020 - continue to avoid nephrotoxic drugs and dose adjust medications to be renally excreted - cmp  7. Cancer of skin of right lower leg - scheduled to see in house dermatologist  8. Loose stools - stopped drinking boost 3 days ago, symptoms improved - dietary consult for nutritional supplement   Family/ staff Communication: plan discussed with patient and nurse  Labs/tests ordered: cbc/diff, cmp

## 2021-01-30 NOTE — Progress Notes (Signed)
Location:   [place] Room Number: 42 Place of Service:  SNF ([phone]) Provider:  Windell Moulding, NP  Richard Dad, MD  Patient Care Team: Richard Dad, MD as PCP - General (Internal Medicine)  Extended Emergency Contact Information Primary Emergency Contact: Richard Spears, Richard Spears Mobile Phone: [phone] Relation: Son Secondary Emergency Contact: Richard Spears Address: [address]          [place]  [place] Home Phone: [phone] Relation: Spouse  Code Status:  FULL CODE Goals of care: Advanced Directive information Advanced Directives 01/30/2021  Does Patient Have a Medical Advance Directive? Yes  Type of Advance Directive Living will  Does patient want to make changes to medical advance directive? No - Patient declined  Copy of [place] in Chart? -  Would patient like information on creating a medical advance directive? -  Pre-existing out of facility DNR order (yellow form or pink MOST form) -     Chief Complaint  Patient presents with   Medical Management of Chronic Issues    Routine follow up visit   Health Maintenance    Zoster vaccine, Flu vaccine    HPI:  Pt is a 87 y.o. male seen today for medical management of chronic diseases.     Past Medical History:  Diagnosis Date   Anemia    leakoppenia   BPH (benign prostatic hypertrophy)    Bullous pemphigoid    [name], March 2011, right forearm squamous cell carcinoma   Chronic anticoagulation    systemic   Colon polyp    transverse, 2002   History of peptic ulcer    remote, 3/95   Hx of actinic keratosis    Hx of basal cell carcinoma    Hx of squamous cell carcinoma of skin    Hyperlipidemia    Left inguinal hernia    Moderate aortic insufficiency 2009   audible aortic insufficiency on 1/09 echo   PAF (paroxysmal atrial fibrillation) ([place]) 01/17/2014   On Warfarin.   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 Jude mechanical valve   ORIF FEMUR FRACTURE Left 08/28/2020   Procedure: OPEN REDUCTION INTERNAL FIXATION (ORIF) DISTAL FEMUR FRACTURE;  Surgeon: Richard Can, MD;  Location: [facility];  Service: Orthopedics;  Laterality: Left;   TOTAL HIP ARTHROPLASTY Right 10/12/2017   Procedure: RIGHT TOTAL HIP ARTHROPLASTY ANTERIOR APPROACH;  Surgeon: Richard Arabian, MD;  Location: [facility];  Service: Orthopedics;  Laterality: Right;   TRANSTHORACIC ECHOCARDIOGRAM  12/2018   Unable to assess diastolic function because of A. fib. Normal RV function, but moderately elevated RVSP.  Severe biatrial enlargement. S/P St Jude bileaflet mechanical MVR that appears to be functioning normally. Mitral valve regurgitation cannot assess due to mechanical valve shadowing. MV Mean grad: 7.0 mmHg MV Area (PHT): 3.38 cm (stable for valve).  Mild Ao Sclerosis, Mild-Mod AI   TRANSTHORACIC ECHOCARDIOGRAM  08/'17; 10/'18    a) Mild conc LVH. EF 55-60%. No RWMA. Mod AI. Mechanical MV prosthesis functioning properly. LAD dilation.;; b)  EF 55-60%.  Mo AI.  Bileaflet Saint Jude mechanical MV with no paravalvular leak.  Severe LA dilation.  Minimally elevated PAP    Allergies  Allergen Reactions   Flexeril [Cyclobenzaprine] Diarrhea    Allergies as of 01/30/2021       Reactions   Flexeril [cyclobenzaprine] Diarrhea        Medication List        Accurate as of January 30, 2021  2:54 PM. If you have any questions, ask your nurse or doctor.          acetaminophen 500 MG tablet Commonly known as: TYLENOL Take 1,000 mg by mouth 3 (three) times daily as needed.   docusate sodium 100  MG capsule Commonly known as: COLACE Take 100 mg by mouth daily as needed for mild constipation.   fluticasone 50 MCG/ACT nasal spray Commonly known as: FLONASE Place 1 spray into both nostrils daily as needed for allergies or rhinitis.   isosorbide mononitrate 30 MG 24 hr tablet Commonly known as: IMDUR Take 15 mg by mouth daily.   lactose free nutrition Liqd Take 237 mLs by mouth 2 (two) times daily between meals.   LORazepam 1 MG tablet Commonly known as: ATIVAN Take 1 tablet (1 mg total) by mouth daily as needed for anxiety.   mirtazapine 7.5 MG tablet Commonly known as: REMERON Take 15 mg by mouth.   MULTIVITAMIN PO Take 1 tablet by mouth daily.   polyethylene glycol 17 g packet Commonly known as: MIRALAX / GLYCOLAX Take 17 g by mouth daily as needed.   PRESERVISION AREDS 2 PO Take 1 capsule by mouth daily.   tamsulosin 0.4 MG Caps capsule Commonly known as: FLOMAX Take 0.8 mg by mouth at bedtime.   warfarin 3 MG tablet Commonly known as: COUMADIN Take 3 mg by mouth daily. Special Instructions: Give 3 mg with Coumadin 0.5 mg to total 3.5 mg on Tues, Thurs, Sat, and Sunday What changed: Another medication with the same name was removed. Continue taking this medication, and follow the directions you see here. Changed by: Richard Alanis, NP   warfarin 1 MG tablet Commonly known as: COUMADIN Take 1 mg by mouth daily. Special Instructions: Give Coumadin 0.5 mg with Coumadin 3 mg to total 3.5 mg on Tues, Thurs, Sat, and Sunday What changed: Another medication with the same name was removed. Continue taking this medication, and follow the directions you see here. Changed by: Richard Alanis, NP   zinc oxide 20 % ointment Apply 1 application topically as needed for irritation.        Review of Systems  Immunization History  Administered Date(s) Administered   Influenza, High Dose Seasonal PF 04/20/2016   Influenza,inj,Quad PF,6+ Mos 03/28/2018    Influenza-Unspecified 03/30/2017   Moderna SARS-COV2 Booster Vaccination 12/03/2020   Moderna Sars-Covid-2 Vaccination 07/02/2019, 07/30/2019, 05/12/2020   Pneumococcal Conjugate-13 06/10/2014   Pneumococcal-Unspecified 02/02/2006   Tdap 06/10/2014   Zoster, Live 03/29/2015   Zoster, Unspecified 05/02/2015   Pertinent  Health Maintenance Due  Topic Date Due   INFLUENZA VACCINE  01/26/2021   PNA vac Low Risk Adult  Completed   Fall Risk  12/24/2020 12/24/2020  Falls in the past year? 1 1  Number falls in past yr: 1 1  Injury with Fall? 1 1  Risk for fall due to : History of fall(s);Impaired balance/gait;Impaired mobility -  Follow up Falls evaluation completed;Education provided;Falls prevention discussed -   Functional Status Survey:    Vitals:   01/30/21  1426  BP: (!) 105/53  Pulse: 81  Resp: 20  Temp: (!) 97.3 F (36.3 C)  SpO2: 94%  Weight: 151 lb (68.5 kg)  Height: 6' (1.829 m)   Body mass index is 20.48 kg/m. Physical Exam  Labs reviewed: Recent Labs    08/31/20 0111 09/01/20 0247 09/02/20 0130 09/08/20 0000 09/23/20 0000 10/15/20 0000 10/23/20 1035  NA 133* 133* 134*   < > 131* 136* 134*  K 3.8 3.7 4.0   < > 4.1 4.0 4.6  CL 102 103 104   < > 99 102 104  CO2 '24 23 22   '$ < > 24* 23* 23*  GLUCOSE 104* 88 97  --   --   --   --   BUN 26* 27* 25*   < > 33* 53* 33*  CREATININE 1.00 0.99 1.05   < > 1.0 1.4* 1.2  CALCIUM 7.9* 7.9* 8.0*   < > 8.6* 8.8 8.6*  MG 2.4 2.2 2.1  --   --   --   --    < > = values in this interval not displayed.   Recent Labs    08/25/20 1832 08/26/20 0332 09/08/20 0000 09/23/20 0000 10/15/20 0000 10/23/20 1035  AST 39 34   < > 38 122* 50*  ALT 24 21   < > 38 164* 96*  ALKPHOS 54 42   < > 93 95 77  BILITOT 1.1 1.1  --   --   --   --   PROT 5.8* 5.3*  --   --   --   --   ALBUMIN 3.4* 2.9*   < > 3.1* 3.3* 5.7*   < > = values in this interval not displayed.   Recent Labs    08/31/20 0111 09/01/20 0247 09/02/20 0130  09/08/20 0000 09/23/20 0000 10/15/20 0000  WBC 4.4 4.0 4.0 4.9 3.1 6.0  NEUTROABS 3.1  --  2.7 3,729.00 2,062.00  --   HGB 10.4* 10.0* 9.7* 10.8* 10.8* 11.4*  HCT 31.3* 29.1* 28.1* 32* 33* 35*  MCV 93.4 92.4 92.7  --   --   --   PLT 122* 144* 164 366 183 248   Lab Results  Component Value Date   TSH 2.39 10/15/2020   No results found for: HGBA1C No results found for: CHOL, HDL, LDLCALC, LDLDIRECT, TRIG, CHOLHDL  Significant Diagnostic Results in last 30 days:  No results found.  Assessment/Plan There are no diagnoses linked to this encounter.   Family/ staff Communication:   Labs/tests ordered:

## 2021-02-03 ENCOUNTER — Non-Acute Institutional Stay (SKILLED_NURSING_FACILITY): Payer: Medicare Other | Admitting: Nurse Practitioner

## 2021-02-03 ENCOUNTER — Encounter: Payer: Self-pay | Admitting: Nurse Practitioner

## 2021-02-03 DIAGNOSIS — N400 Enlarged prostate without lower urinary tract symptoms: Secondary | ICD-10-CM | POA: Diagnosis not present

## 2021-02-03 DIAGNOSIS — K5901 Slow transit constipation: Secondary | ICD-10-CM | POA: Diagnosis not present

## 2021-02-03 DIAGNOSIS — M159 Polyosteoarthritis, unspecified: Secondary | ICD-10-CM

## 2021-02-03 DIAGNOSIS — I10 Essential (primary) hypertension: Secondary | ICD-10-CM

## 2021-02-03 DIAGNOSIS — D649 Anemia, unspecified: Secondary | ICD-10-CM

## 2021-02-03 DIAGNOSIS — N1831 Chronic kidney disease, stage 3a: Secondary | ICD-10-CM | POA: Diagnosis not present

## 2021-02-03 DIAGNOSIS — I872 Venous insufficiency (chronic) (peripheral): Secondary | ICD-10-CM | POA: Diagnosis not present

## 2021-02-03 DIAGNOSIS — I4811 Longstanding persistent atrial fibrillation: Secondary | ICD-10-CM | POA: Diagnosis not present

## 2021-02-03 DIAGNOSIS — Z952 Presence of prosthetic heart valve: Secondary | ICD-10-CM

## 2021-02-03 DIAGNOSIS — D62 Acute posthemorrhagic anemia: Secondary | ICD-10-CM | POA: Insufficient documentation

## 2021-02-03 DIAGNOSIS — G4700 Insomnia, unspecified: Secondary | ICD-10-CM | POA: Diagnosis not present

## 2021-02-03 NOTE — Progress Notes (Signed)
Location:   [place] Room Number: [place] of Service:  SNF (31) Provider:  Havier Deeb Otho Darner, NP  Virgie Dad, MD  Patient Care Team: Virgie Dad, MD as PCP - General (Internal Medicine)  Extended Emergency Contact Information Primary Emergency Contact: Alexa, Golebiewski Mobile Phone: [phone] Relation: Son Secondary Emergency Contact: [facility] Address: [address]          [place]  [place] Home Phone: [phone] Relation: Spouse  Code Status:  DNR Goals of care: Advanced Directive information Advanced Directives 02/03/2021  Does Patient Have a Medical Advance Directive? Yes  Type of Advance Directive Living will;Out of facility DNR (pink MOST or yellow form)  Does patient want to make changes to medical advance directive? No - Patient declined  Copy of Reserve in Chart? -  Would patient like information on creating a medical advance directive? -  Pre-existing out of facility DNR order (yellow form or pink MOST form) Yellow form placed in chart (order not valid for inpatient use);Pink MOST form placed in chart (order not valid for inpatient use)     Chief Complaint  Patient presents with   Acute Visit    Patient presents for anemia    HPI:  Pt is a 87 y.o. male seen today for an acute visit for anemia. 02/02/21 Hgb 8.8, MCV/MCH wnl. The patient denied GI symptoms, blood in urine or stools. Iron 60, Vit B12 478, Folate 44. Hgb 11.4 11/14/20  S/p closed displaced fracture of distal epiphysis of left femur, ORIF 08/28/20             S/P MVR  goal of INR 2.5-3.5, takes Coumadin, PT 2.8 01/29/21             BPH no urinary retention. Takes Tamsulosin.  Had CT at urology.             OA s/p R+L hip arhtroplasties             PAF takes Coumadin             HTN takes Isosorbide. Bun/creat 35/1.43 eGFR 46 02/02/21             CKD Bun/creat 35/1.43 eGFR 46 02/02/21             Constipation, takes Colace, MiraLax              Insomnia, sleeps and eats better, takes Mirtazapine, Lorazepam. TSH 2.39 10/15/20  PVD/edema BLE, chronic   Past Medical History:  Diagnosis Date   Anemia    leakoppenia   BPH (benign prostatic hypertrophy)    Bullous pemphigoid    [name], March 2011, right forearm squamous cell carcinoma   Chronic anticoagulation    systemic   Colon polyp    transverse, 2002   History of peptic ulcer    remote, 3/95   Hx of actinic keratosis    Hx of basal cell carcinoma    Hx of squamous cell carcinoma of skin    Hyperlipidemia    Left inguinal hernia    Moderate aortic insufficiency 2009   audible aortic insufficiency on 1/09 echo   PAF (paroxysmal atrial fibrillation) ([place]) 01/17/2014   On Warfarin.   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 Jude mechanical valve   ORIF FEMUR FRACTURE Left 08/28/2020   Procedure: OPEN REDUCTION INTERNAL FIXATION (ORIF) DISTAL FEMUR FRACTURE;  Surgeon: Rod Can, MD;  Location: [place];  Service: Orthopedics;  Laterality: Left;   TOTAL HIP ARTHROPLASTY Right 10/12/2017   Procedure: RIGHT TOTAL HIP ARTHROPLASTY ANTERIOR APPROACH;  Surgeon: Gaynelle Arabian, MD;  Location: [facility];  Service: Orthopedics;  Laterality: Right;   TRANSTHORACIC ECHOCARDIOGRAM  12/2018   Unable to assess diastolic function because of A. fib. Normal RV function, but moderately elevated RVSP.  Severe biatrial enlargement. S/P St Jude bileaflet mechanical MVR that appears to be functioning normally. Mitral valve regurgitation cannot assess due to mechanical valve shadowing. MV Mean grad: 7.0 mmHg MV Area  (PHT): 3.38 cm (stable for valve).  Mild Ao Sclerosis, Mild-Mod AI   TRANSTHORACIC ECHOCARDIOGRAM  08/'17; 10/'18    a) Mild conc LVH. EF 55-60%. No RWMA. Mod AI. Mechanical MV prosthesis functioning properly. LAD dilation.;; b)  EF 55-60%.  Mo AI.  Bileaflet Saint Jude mechanical MV with no paravalvular leak.  Severe LA dilation.  Minimally elevated PAP    Allergies  Allergen Reactions   Flexeril [Cyclobenzaprine] Diarrhea    Allergies as of 02/03/2021       Reactions   Flexeril [cyclobenzaprine] Diarrhea        Medication List        Accurate as of February 03, 2021 12:07 PM. If you have any questions, ask your nurse or doctor.          acetaminophen 500 MG tablet Commonly known as: TYLENOL Take 1,000 mg by mouth 3 (three) times daily as needed.   docusate sodium 100 MG capsule Commonly known as: COLACE Take 100 mg by mouth daily as needed for mild constipation.   fluticasone 50 MCG/ACT nasal spray Commonly known as: FLONASE Place 1 spray into both nostrils daily as needed for allergies or rhinitis.   isosorbide mononitrate 30 MG 24 hr tablet Commonly known as: IMDUR Take 15 mg by mouth daily.   lactose free nutrition Liqd Take 237 mLs by mouth 2 (two) times daily between meals.   LORazepam 1 MG tablet Commonly known as: ATIVAN Take 1 tablet (1 mg total) by mouth daily as needed for anxiety.   mirtazapine 7.5 MG tablet Commonly known as: REMERON Take 15 mg by mouth.   MULTIVITAMIN PO Take 1 tablet by mouth daily.   polyethylene glycol 17 g packet Commonly known as: MIRALAX / GLYCOLAX Take 17 g by mouth daily as needed.   PRESERVISION AREDS 2 PO Take 1 capsule by mouth daily.   tamsulosin 0.4 MG Caps capsule Commonly known as: FLOMAX Take 0.8 mg by mouth at bedtime.   warfarin 3 MG tablet Commonly known as: COUMADIN Take 3 mg by mouth daily. Special Instructions: Give 3 mg with Coumadin 0.5 mg to total 3.5 mg on Tues, Thurs, Sat, and Sunday    warfarin 1 MG tablet Commonly known as: COUMADIN Take 1 mg by mouth daily. Special Instructions: Give Coumadin 0.5 mg with Coumadin 3 mg to total 3.5 mg on Tues, Thurs, Sat, and Sunday   zinc oxide 20 % ointment Apply 1 application topically as needed for irritation.  Review of Systems  Constitutional:  Negative for appetite change, fatigue and fever.  HENT:  Positive for hearing loss. Negative for congestion and voice change.   Respiratory:  Negative for cough and shortness of breath.   Cardiovascular:  Positive for leg swelling.  Gastrointestinal:  Negative for abdominal pain, blood in stool, constipation, nausea and vomiting.  Genitourinary:  Negative for difficulty urinating, dysuria, hematuria and urgency.       Incontinent of urine, uses condom catheter.   Musculoskeletal:  Positive for arthralgias and gait problem.  Skin:  Negative for color change.  Neurological:  Negative for speech difficulty, weakness and light-headedness.       Memory lapses.   Psychiatric/Behavioral:  Negative for behavioral problems and sleep disturbance. The patient is not nervous/anxious.    Immunization History  Administered Date(s) Administered   Influenza, High Dose Seasonal PF 04/20/2016   Influenza,inj,Quad PF,6+ Mos 03/28/2018   Influenza-Unspecified 03/30/2017   Moderna SARS-COV2 Booster Vaccination 12/03/2020   Moderna Sars-Covid-2 Vaccination 07/02/2019, 07/30/2019, 05/12/2020   Pneumococcal Conjugate-13 06/10/2014   Pneumococcal-Unspecified 02/02/2006   Tdap 06/10/2014   Zoster, Live 03/29/2015   Zoster, Unspecified 05/02/2015   Pertinent  Health Maintenance Due  Topic Date Due   INFLUENZA VACCINE  01/26/2021   PNA vac Low Risk Adult  Completed   Fall Risk  12/24/2020 12/24/2020  Falls in the past year? 1 1  Number falls in past yr: 1 1  Injury with Fall? 1 1  Risk for fall due to : History of fall(s);Impaired balance/gait;Impaired mobility -  Follow up Falls evaluation  completed;Education provided;Falls prevention discussed -   Functional Status Survey:    Vitals:   02/03/21 1027  BP: (!) 105/53  Pulse: 81  Resp: 20  Temp: 97.9 F (36.6 C)  SpO2: 97%  Weight: 151 lb (68.5 kg)  Height: 6' (1.829 m)   Body mass index is 20.48 kg/m. Physical Exam Vitals and nursing note reviewed.  Constitutional:      Appearance: Normal appearance.  HENT:     Head: Normocephalic and atraumatic.     Mouth/Throat:     Mouth: Mucous membranes are moist.  Eyes:     Extraocular Movements: Extraocular movements intact.     Conjunctiva/sclera: Conjunctivae normal.     Pupils: Pupils are equal, round, and reactive to light.  Cardiovascular:     Rate and Rhythm: Normal rate. Rhythm irregular.     Heart sounds: Murmur heard.  Pulmonary:     Effort: Pulmonary effort is normal.     Breath sounds: No rales.  Abdominal:     General: Bowel sounds are normal.     Palpations: Abdomen is soft.     Tenderness: There is no abdominal tenderness.  Musculoskeletal:     Cervical back: Normal range of motion and neck supple.     Right lower leg: Edema present.     Left lower leg: Edema present.     Comments: Trace edema BLE  Skin:    General: Skin is warm and dry.     Comments: dark pigmented venous insufficiency skin changes BLE.    Neurological:     General: No focal deficit present.     Mental Status: He is alert. Mental status is at baseline.     Motor: No weakness.     Coordination: Coordination normal.     Gait: Gait abnormal.  Psychiatric:        Mood and Affect: Mood normal.  Behavior: Behavior normal.        Thought Content: Thought content normal.    Labs reviewed: Recent Labs    08/31/20 0111 09/01/20 0247 09/02/20 0130 09/08/20 0000 09/23/20 0000 10/15/20 0000 10/23/20 1035  NA 133* 133* 134*   < > 131* 136* 134*  K 3.8 3.7 4.0   < > 4.1 4.0 4.6  CL 102 103 104   < > 99 102 104  CO2 $Re'24 23 22   'ILT$ < > 24* 23* 23*  GLUCOSE 104* 88 97  --    --   --   --   BUN 26* 27* 25*   < > 33* 53* 33*  CREATININE 1.00 0.99 1.05   < > 1.0 1.4* 1.2  CALCIUM 7.9* 7.9* 8.0*   < > 8.6* 8.8 8.6*  MG 2.4 2.2 2.1  --   --   --   --    < > = values in this interval not displayed.   Recent Labs    08/25/20 1832 08/26/20 0332 09/08/20 0000 09/23/20 0000 10/15/20 0000 10/23/20 1035  AST 39 34   < > 38 122* 50*  ALT 24 21   < > 38 164* 96*  ALKPHOS 54 42   < > 93 95 77  BILITOT 1.1 1.1  --   --   --   --   PROT 5.8* 5.3*  --   --   --   --   ALBUMIN 3.4* 2.9*   < > 3.1* 3.3* 5.7*   < > = values in this interval not displayed.   Recent Labs    08/31/20 0111 09/01/20 0247 09/02/20 0130 09/08/20 0000 09/23/20 0000 10/15/20 0000  WBC 4.4 4.0 4.0 4.9 3.1 6.0  NEUTROABS 3.1  --  2.7 3,729.00 2,062.00  --   HGB 10.4* 10.0* 9.7* 10.8* 10.8* 11.4*  HCT 31.3* 29.1* 28.1* 32* 33* 35*  MCV 93.4 92.4 92.7  --   --   --   PLT 122* 144* 164 366 183 248   Lab Results  Component Value Date   TSH 2.39 10/15/2020   No results found for: HGBA1C No results found for: CHOL, HDL, LDLCALC, LDLDIRECT, TRIG, CHOLHDL  Significant Diagnostic Results in last 30 days:  No results found.  Assessment/Plan Normochromic normocytic anemia 02/02/21 Hgb 8.8, MCV/MCH wnl. The patient denied GI symptoms, blood in urine or stools, will obtain CBC/diff, iron, FeSat, TIBC, Ferritin, Retic Count, PT/INR, FOBT x3, Vit B12, Folate. Adding Omeprazole $RemoveBeforeDEI'20mg'[id number]$  qd for GI protection.   Generalized osteoarthritis of multiple sites S/p closed displaced fracture of distal epiphysis of left femur, ORIF 08/28/20. Hx of R+L hip arhtroplasties  S/P MVR (mitral valve replacement) S/P MVR  goal of INR 2.5-3.5, takes Coumadin, PT 2.8 01/29/21  BPH (benign prostatic hyperplasia) no urinary retention. Takes Tamsulosin.  Had CT at urology.  Longstanding persistent atrial fibrillation: CHA2DS2-VASc Score 3 Heart rate is in control, takes Coumadin  Essential hypertension Blood pressure  is controlled, takes Isosorbide. Bun/creat 35/1.43 eGFR 46 02/02/21  CKD (chronic kidney disease) stage 3, GFR 30-59 ml/min (HCC) Bun/creat 35/1.43 eGFR 46 02/02/21  Slow transit constipation  takes Colace, MiraLax  Insomnia sleeps and eats better, takes Mirtazapine, Lorazepam. TSH 2.39 10/15/20  Venous insufficiency Chronic pigmented BLE, swelling, desires TED.     Family/ staff Communication: plan of care reviewed with the patient and charge nurse.   Labs/tests ordered: CBC/diff, iron, FeSat, TIBC, Ferritin, Retic Count, PT/INR, FOBT x3, Vit  B12, Folate  Time spend 35 minutes.

## 2021-02-03 NOTE — Assessment & Plan Note (Signed)
Chronic pigmented BLE, swelling, desires TED.

## 2021-02-03 NOTE — Assessment & Plan Note (Signed)
takes Colace, MiraLax 

## 2021-02-03 NOTE — Assessment & Plan Note (Signed)
no urinary retention. Takes Tamsulosin.  Had CT at urology.

## 2021-02-03 NOTE — Assessment & Plan Note (Signed)
S/p closed displaced fracture of distal epiphysis of left femur, ORIF 08/28/20. Hx of R+L hip arhtroplasties

## 2021-02-03 NOTE — Assessment & Plan Note (Signed)
Blood pressure is controlled, takes Isosorbide. Bun/creat 35/1.43 eGFR 46 02/02/21

## 2021-02-03 NOTE — Assessment & Plan Note (Addendum)
02/02/21 Hgb 8.8, MCV/MCH wnl. The patient denied GI symptoms, blood in urine or stools, will obtain CBC/diff, iron, FeSat, TIBC, Ferritin, Retic Count, PT/INR, FOBT x3, Vit B12, Folate. Adding Omeprazole '20mg'$  qd for GI protection.  Repeat CBC wbc 2.2, Hgb 10, FOBT negative.

## 2021-02-03 NOTE — Assessment & Plan Note (Signed)
sleeps and eats better, takes Mirtazapine, Lorazepam. TSH 2.39 10/15/20

## 2021-02-03 NOTE — Assessment & Plan Note (Signed)
Heart rate is in control, takes Coumadin

## 2021-02-03 NOTE — Assessment & Plan Note (Signed)
Bun/creat 35/1.43 eGFR 46 02/02/21

## 2021-02-03 NOTE — Assessment & Plan Note (Signed)
S/P MVR  goal of INR 2.5-3.5, takes Coumadin, PT 2.8 01/29/21

## 2021-02-05 LAB — CBC: RBC: 3.23 — AB (ref 3.87–5.11)

## 2021-02-05 LAB — CBC AND DIFFERENTIAL
HCT: 31 — AB (ref 41–53)
Hemoglobin: 10 — AB (ref 13.5–17.5)
Neutrophils Absolute: 1109
Platelets: 145 — AB (ref 150–399)
WBC: 2.2

## 2021-02-05 LAB — IRON,TIBC AND FERRITIN PANEL
%SAT: 24
Ferritin: 249
Iron: 67
TIBC: 285

## 2021-02-05 LAB — VITAMIN B12: Vitamin B-12: 593

## 2021-02-23 ENCOUNTER — Other Ambulatory Visit: Payer: Self-pay

## 2021-02-23 NOTE — Telephone Encounter (Signed)
Refill request received from [facility] for Lorazepam 1 mg tablet. Medication pended and sent to Windell Moulding, NP for approval

## 2021-02-24 MED ORDER — LORAZEPAM 1 MG PO TABS: 1.0000 mg | ORAL_TABLET | Freq: Every day | ORAL | 0 refills | Status: DC | PRN

## 2021-03-06 ENCOUNTER — Encounter: Payer: Self-pay | Admitting: Nurse Practitioner

## 2021-03-06 ENCOUNTER — Non-Acute Institutional Stay (SKILLED_NURSING_FACILITY): Payer: Medicare Other | Admitting: Nurse Practitioner

## 2021-03-06 DIAGNOSIS — I872 Venous insufficiency (chronic) (peripheral): Secondary | ICD-10-CM

## 2021-03-06 DIAGNOSIS — D649 Anemia, unspecified: Secondary | ICD-10-CM

## 2021-03-06 DIAGNOSIS — Z952 Presence of prosthetic heart valve: Secondary | ICD-10-CM | POA: Diagnosis not present

## 2021-03-06 DIAGNOSIS — I4811 Longstanding persistent atrial fibrillation: Secondary | ICD-10-CM

## 2021-03-06 DIAGNOSIS — M159 Polyosteoarthritis, unspecified: Secondary | ICD-10-CM | POA: Diagnosis not present

## 2021-03-06 DIAGNOSIS — G4700 Insomnia, unspecified: Secondary | ICD-10-CM

## 2021-03-06 DIAGNOSIS — N400 Enlarged prostate without lower urinary tract symptoms: Secondary | ICD-10-CM | POA: Diagnosis not present

## 2021-03-06 DIAGNOSIS — I10 Essential (primary) hypertension: Secondary | ICD-10-CM

## 2021-03-06 DIAGNOSIS — N1831 Chronic kidney disease, stage 3a: Secondary | ICD-10-CM

## 2021-03-06 DIAGNOSIS — K5901 Slow transit constipation: Secondary | ICD-10-CM | POA: Diagnosis not present

## 2021-03-06 LAB — FOLATE
Folate: >24
Retic Ct Abs: 67830
Reticulocyte Count: 2.1

## 2021-03-06 NOTE — Assessment & Plan Note (Signed)
Bun/creat 35/1.43 eGFR 46 02/02/21

## 2021-03-06 NOTE — Assessment & Plan Note (Signed)
takes Coumadin

## 2021-03-06 NOTE — Assessment & Plan Note (Signed)
02/02/21 Hgb 8.8<<10.0 02/05/21, ron 60, Vit B12 478, Folate 44. Placed on Omeprazole for GI protection 02/03/21

## 2021-03-06 NOTE — Assessment & Plan Note (Signed)
Blood pressure is controlled, takes Isosorbide. Bun/creat 35/1.43 eGFR 46 02/02/21

## 2021-03-06 NOTE — Assessment & Plan Note (Signed)
nsomnia, sleeps and eats better, takes Mirtazapine, Lorazepam was increased to '1mg'$  qhs 02/23/21,  TSH 2.39 10/15/20

## 2021-03-06 NOTE — Assessment & Plan Note (Signed)
takes Colace, MiraLax 

## 2021-03-06 NOTE — Progress Notes (Signed)
Location:   SNF [place] Room Number: 47 Place of Service:  SNF (31)  Provider: Marlana Latus NP  PCP: Virgie Dad, MD Patient Care Team: Virgie Dad, MD as PCP - General (Internal Medicine)  Extended Emergency Contact Information Primary Emergency Contact: [facility] Address: [address]  [id number]          [place] [address] [place] Phone: [phone] Relation: Spouse Secondary Emergency Contact: Drayven, Marchena Mobile Phone: [phone] Relation: Son  Code Status: DNR Goals of care:  Advanced Directive information Advanced Directives 03/06/2021  Does Patient Have a Medical Advance Directive? Yes  Type of Advance Directive Living will;Out of facility DNR (pink MOST or yellow form)  Does patient want to make changes to medical advance directive? No - Patient declined  Copy of [place] in Chart? -  Would patient like information on creating a medical advance directive? -  Pre-existing out of facility DNR order (yellow form or pink MOST form) Yellow form placed in chart (order not valid for inpatient use);Pink MOST form placed in chart (order not valid for inpatient use)     Allergies  Allergen Reactions   Flexeril [Cyclobenzaprine] Diarrhea    Chief Complaint  Patient presents with   Discharge Note    Discharge to [place]    HPI:  87 y.o. male with medical history of OA, s/p R+L hip arthroplasties, s/p ORIF closed displaced fracture of distal epiphysis of left femur 08/28/20 admitted to SNF [facility] for therapy. He has regained physical strength, ADL function to return to [facility] for continuation of therapy.   The following medication conditions was managed while in SNF [facility]   Anemia. 02/02/21 Hgb 8.8<<10.0 02/05/21, ron 60, Vit B12 478, Folate 44. Placed on Omeprazole for GI protection 02/03/21             S/p closed displaced fracture of distal epiphysis of left femur, ORIF 08/28/20             S/P MVR  goal of INR  2.5-3.5, takes Coumadin             BPH no urinary retention. Takes Tamsulosin.  Had CT at urology.             OA s/p R+L hip arhtroplasties             PAF takes Coumadin             HTN takes Isosorbide. Bun/creat 35/1.43 eGFR 46 02/02/21             CKD Bun/creat 35/1.43 eGFR 46 02/02/21             Constipation, takes Colace, MiraLax             Insomnia, sleeps and eats better, takes Mirtazapine, Lorazepam was increased to 77m qhs 02/23/21,  TSH 2.39 10/15/20             PVD/edema BLE, chronic, wears compression hosiery.       Past Medical History:  Diagnosis Date   Anemia    leakoppenia   BPH (benign prostatic hypertrophy)    Bullous pemphigoid    DWilhemina Bonito March 2011, right forearm squamous cell carcinoma   Chronic anticoagulation    systemic   Colon polyp    transverse, 2002   History of peptic ulcer    remote, 3/95   Hx of actinic keratosis    Hx of basal cell carcinoma  Hx of squamous cell carcinoma of skin    Hyperlipidemia    Left inguinal hernia    Moderate aortic insufficiency 2009   audible aortic insufficiency on 1/09 echo   PAF (paroxysmal atrial fibrillation) ([place]) 01/17/2014   On Warfarin.   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 Jude mechanical valve   ORIF FEMUR FRACTURE Left 08/28/2020   Procedure: OPEN REDUCTION INTERNAL FIXATION (ORIF) DISTAL FEMUR FRACTURE;  Surgeon: Rod Can, MD;  Location: [facility];  Service: Orthopedics;  Laterality: Left;   TOTAL HIP ARTHROPLASTY Right 10/12/2017   Procedure: RIGHT TOTAL HIP ARTHROPLASTY ANTERIOR APPROACH;  Surgeon: Gaynelle Arabian, MD;  Location: [facility];  Service: Orthopedics;  Laterality: Right;   TRANSTHORACIC ECHOCARDIOGRAM  12/2018   Unable to assess diastolic function because of A. fib. Normal RV function, but moderately elevated RVSP.  Severe biatrial enlargement. S/P St Jude bileaflet mechanical MVR that appears to be functioning normally. Mitral valve regurgitation cannot assess due to mechanical valve shadowing. MV Mean grad: 7.0 mmHg MV Area (PHT): 3.38 cm (stable for valve).  Mild Ao Sclerosis, Mild-Mod AI   TRANSTHORACIC ECHOCARDIOGRAM  08/'17; 10/'18    a) Mild conc LVH. EF 55-60%. No RWMA. Mod AI. Mechanical MV prosthesis functioning properly. LAD dilation.;; b)  EF 55-60%.  Mo AI.  Bileaflet Saint Jude mechanical MV with no paravalvular leak.  Severe LA dilation.  Minimally elevated PAP      reports that he quit smoking about 62 years ago. His smoking use included cigarettes. He started smoking about 75 years ago. He has a 13.00 pack-year smoking history. He has never used smokeless tobacco. He reports current alcohol use. He reports that he does not use drugs. Social History   Socioeconomic History   Marital status: Married    Spouse name: Not on file   Number of children: 2   Years of education: Not on file   Highest education level: Not on file  Occupational History   Occupation: retired  Tobacco Use   Smoking status: Former    Packs/day: 1.00    Years: 13.00    Pack years: 13.00    Types: Cigarettes    Start date: 1947    Quit date: 1960    Years since quitting: 62.7   Smokeless tobacco: Never  Vaping Use   Vaping Use: Never used  Substance and Sexual Activity   Alcohol use: Yes    Alcohol/week: 0.0 standard drinks    Comment: 1-2 drinks per day   Drug use: Never   Sexual activity: Not on file  Other Topics Concern   Not on file  Social History Narrative   Patient [facility] at [facility], With his wife - Meryl Crutch.   Social Determinants of Health   Financial Resource Strain:  Not on file  Food Insecurity: Not on file  Transportation Needs: Not on file  Physical Activity: Not on file  Stress: Not on file  Social Connections: Not on file  Intimate Partner Violence: Not on file   Functional Status Survey:  Allergies  Allergen Reactions   Flexeril [Cyclobenzaprine] Diarrhea    Pertinent  Health Maintenance Due  Topic Date Due   INFLUENZA VACCINE  01/26/2021   PNA vac Low Risk Adult  Completed    Medications: Allergies as of 03/06/2021       Reactions   Flexeril [cyclobenzaprine] Diarrhea        Medication List        Accurate as of March 06, 2021  3:44 PM. If you have any questions, ask your nurse or doctor.          acetaminophen 500 MG tablet Commonly known as: TYLENOL Take 1,000 mg by mouth 3 (three) times daily as needed.   docusate sodium 100 MG capsule Commonly known as: COLACE Take 100 mg by mouth daily as needed for mild constipation.   fluticasone 50 MCG/ACT nasal spray Commonly known as: FLONASE Place 1 spray into both nostrils daily as needed for allergies or rhinitis.   isosorbide mononitrate 30 MG 24 hr tablet Commonly known as: IMDUR Take 15 mg by mouth daily.   lactose free nutrition Liqd Take 237 mLs by mouth 2 (two) times daily between meals.   LORazepam 1 MG tablet Commonly known as: ATIVAN Take 1 tablet (1 mg total) by mouth daily as needed for anxiety.   mirtazapine 7.5 MG tablet Commonly known as: REMERON Take 15 mg by mouth.   MULTIVITAMIN PO Take 1 tablet by mouth daily.   omeprazole 20 MG capsule Commonly known as: PRILOSEC Take 20 mg by mouth daily.   polyethylene glycol 17 g packet Commonly known as: MIRALAX / GLYCOLAX Take 17 g by mouth daily as needed.   PRESERVISION AREDS 2 PO Take 1 capsule by mouth daily.   tamsulosin 0.4 MG Caps capsule Commonly known as: FLOMAX Take 0.8 mg by mouth at bedtime.   warfarin 3 MG tablet Commonly known as: COUMADIN Take 3 mg by mouth daily.  Special Instructions: Give 3 mg with Coumadin 0.5 mg to total 3.5 mg on Tues, Thurs, Sat, and Sunday   warfarin 1 MG tablet Commonly known as: COUMADIN Take 1 mg by mouth daily. Special Instructions: Give Coumadin 0.5 mg with Coumadin 3 mg to total 3.5 mg on Tues, Thurs, Sat, and Sunday   zinc oxide 20 % ointment Apply 1 application topically as needed for irritation.        Review of Systems  Constitutional:  Negative for appetite change, fatigue and fever.  HENT:  Positive for hearing loss. Negative for congestion and voice change.   Respiratory:  Negative for cough and shortness of breath.   Cardiovascular:  Positive for leg swelling.  Gastrointestinal:  Negative for abdominal pain, blood in stool, constipation, nausea and vomiting.  Genitourinary:  Negative for difficulty urinating, dysuria, hematuria and urgency.       Incontinent of urine, uses condom catheter.   Musculoskeletal:  Positive for arthralgias and gait problem.  Skin:  Negative for color change.  Neurological:  Negative for speech difficulty, weakness and light-headedness.       Memory lapses.   Psychiatric/Behavioral:  Negative for behavioral problems and sleep disturbance. The patient is not nervous/anxious.    Vitals:   03/06/21 1315  BP: 131/82  Pulse: 89  Resp: 16  Temp: (!) 97.2 F (36.2 C)  SpO2: 93%  Weight: 158 lb 11.2 oz (72 kg)  Height: 6' (1.829 m)   Body mass index is 21.52 kg/m. Physical Exam Vitals and nursing note reviewed.  Constitutional:  Appearance: Normal appearance.  HENT:     Head: Normocephalic and atraumatic.     Mouth/Throat:     Mouth: Mucous membranes are moist.  Eyes:     Extraocular Movements: Extraocular movements intact.     Conjunctiva/sclera: Conjunctivae normal.     Pupils: Pupils are equal, round, and reactive to light.  Cardiovascular:     Rate and Rhythm: Normal rate. Rhythm irregular.     Heart sounds: Murmur heard.  Pulmonary:     Effort: Pulmonary  effort is normal.     Breath sounds: No rales.  Abdominal:     General: Bowel sounds are normal.     Palpations: Abdomen is soft.     Tenderness: There is no abdominal tenderness.  Musculoskeletal:     Cervical back: Normal range of motion and neck supple.     Right lower leg: Edema present.     Left lower leg: Edema present.     Comments: 1+ edema BLE  Skin:    General: Skin is warm and dry.     Comments: dark pigmented venous insufficiency skin changes BLE.    Neurological:     General: No focal deficit present.     Mental Status: He is alert. Mental status is at baseline.     Motor: No weakness.     Coordination: Coordination normal.     Gait: Gait abnormal.  Psychiatric:        Mood and Affect: Mood normal.        Behavior: Behavior normal.        Thought Content: Thought content normal.    Labs reviewed: Basic Metabolic Panel: Recent Labs    08/31/20 0111 09/01/20 0247 09/02/20 0130 09/08/20 0000 09/23/20 0000 10/15/20 0000 10/23/20 1035  NA 133* 133* 134*   < > 131* 136* 134*  K 3.8 3.7 4.0   < > 4.1 4.0 4.6  CL 102 103 104   < > 99 102 104  CO2 _0 < > 24* 23* 23*  GLUCOSE 104* 88 97  --   --   --   --   BUN 26* 27* 25*   < > 33* 53* 33*  CREATININE 1.00 0.99 1.05   < > 1.0 1.4* 1.2  CALCIUM 7.9* 7.9* 8.0*   < > 8.6* 8.8 8.6*  MG 2.4 2.2 2.1  --   --   --   --    < > = values in this interval not displayed.   Liver Function Tests: Recent Labs    08/25/20 1832 08/26/20 0332 09/08/20 0000 09/23/20 0000 10/15/20 0000 10/23/20 1035  AST 39 34   < > 38 122* 50*  ALT 24 21   < > 38 164* 96*  ALKPHOS 54 42   < > 93 95 77  BILITOT 1.1 1.1  --   --   --   --   PROT 5.8* 5.3*  --   --   --   --   ALBUMIN 3.4* 2.9*   < > 3.1* 3.3* 5.7*   < > = values in this interval not displayed.   No results for input(s): LIPASE, AMYLASE in the last 8760 hours. No results for input(s): AMMONIA in the last 8760 hours. CBC: Recent Labs    08/31/20 0111  09/01/20 0247 09/02/20 0130 09/02/20 0130 09/08/20 0000 09/23/20 0000 10/15/20 0000 02/05/21 0000  WBC 4.4 4.0 4.0   < > 4.9 3.1 6.0 2.2  NEUTROABS 3.1  --  2.7  --  3,729.00 2,062.00  --  1,109.00  HGB 10.4* 10.0* 9.7*  --  10.8* 10.8* 11.4* 10.0*  HCT 31.3* 29.1* 28.1*  --  32* 33* 35* 31*  MCV 93.4 92.4 92.7  --   --   --   --   --   PLT 122* 144* 164  --  366 183 248 145*   < > = values in this interval not displayed.   Cardiac Enzymes: No results for input(s): CKTOTAL, CKMB, CKMBINDEX, TROPONINI in the last 8760 hours. BNP: Invalid input(s): POCBNP CBG: No results for input(s): GLUCAP in the last 8760 hours.  Procedures and Imaging Studies During Stay: No results found.  Assessment/Plan:   Normochromic normocytic anemia 02/02/21 Hgb 8.8<<10.0 02/05/21, ron 60, Vit B12 478, Folate 44. Placed on Omeprazole for GI protection 02/03/21  Generalized osteoarthritis of multiple sites S/p closed displaced fracture of distal epiphysis of left femur, ORIF 08/28/20. s/p R+L hip arhtroplasties  S/P MVR (mitral valve replacement) S/P MVR  goal of INR 2.5-3.5, takes Coumadin  BPH (benign prostatic hyperplasia) no urinary retention. Takes Tamsulosin.  Had CT at urology.  Longstanding persistent atrial fibrillation: CHA2DS2-VASc Score 3 takes Coumadin  Essential hypertension Blood pressure is controlled, takes Isosorbide. Bun/creat 35/1.43 eGFR 46 02/02/21  CKD (chronic kidney disease) stage 3, GFR 30-59 ml/min (HCC) Bun/creat 35/1.43 eGFR 46 02/02/21  Slow transit constipation takes Colace, MiraLax  Insomnia nsomnia, sleeps and eats better, takes Mirtazapine, Lorazepam was increased to 7m qhs 02/23/21,  TSH 2.39 10/15/20  Venous insufficiency chronic, wears compression hosiery.    Patient is being discharged with the following home health services:    Patient is being discharged with the following durable medical equipment:    Patient has been advised to f/u with their PCP in  1-2 weeks to bring them up to date on their rehab stay.  Social services at facility was responsible for arranging this appointment.  Pt was provided with a 30 day supply of prescriptions for medications and refills must be obtained from their PCP.  For controlled substances, a more limited supply may be provided adequate until PCP appointment only.  Future labs/tests needed:  prn

## 2021-03-06 NOTE — Assessment & Plan Note (Addendum)
S/p closed displaced fracture of distal epiphysis of left femur, ORIF 08/28/20. s/p R+L hip arhtroplasties

## 2021-03-06 NOTE — Assessment & Plan Note (Signed)
no urinary retention. Takes Tamsulosin.  Had CT at urology.

## 2021-03-06 NOTE — Assessment & Plan Note (Signed)
chronic, wears compression hosiery.

## 2021-03-06 NOTE — Assessment & Plan Note (Signed)
S/P MVR  goal of INR 2.5-3.5, takes Coumadin

## 2021-03-12 DIAGNOSIS — Z9181 History of falling: Secondary | ICD-10-CM | POA: Diagnosis not present

## 2021-03-12 DIAGNOSIS — R2681 Unsteadiness on feet: Secondary | ICD-10-CM | POA: Diagnosis not present

## 2021-03-12 DIAGNOSIS — M6281 Muscle weakness (generalized): Secondary | ICD-10-CM | POA: Diagnosis not present

## 2021-03-16 DIAGNOSIS — M6281 Muscle weakness (generalized): Secondary | ICD-10-CM | POA: Diagnosis not present

## 2021-03-16 DIAGNOSIS — Z9181 History of falling: Secondary | ICD-10-CM | POA: Diagnosis not present

## 2021-03-16 DIAGNOSIS — R2681 Unsteadiness on feet: Secondary | ICD-10-CM | POA: Diagnosis not present

## 2021-03-17 DIAGNOSIS — M6281 Muscle weakness (generalized): Secondary | ICD-10-CM | POA: Diagnosis not present

## 2021-03-17 DIAGNOSIS — Z9181 History of falling: Secondary | ICD-10-CM | POA: Diagnosis not present

## 2021-03-17 DIAGNOSIS — R2681 Unsteadiness on feet: Secondary | ICD-10-CM | POA: Diagnosis not present

## 2021-03-18 DIAGNOSIS — R2681 Unsteadiness on feet: Secondary | ICD-10-CM | POA: Diagnosis not present

## 2021-03-18 DIAGNOSIS — M6281 Muscle weakness (generalized): Secondary | ICD-10-CM | POA: Diagnosis not present

## 2021-03-18 DIAGNOSIS — Z9181 History of falling: Secondary | ICD-10-CM | POA: Diagnosis not present

## 2021-03-19 DIAGNOSIS — Z9181 History of falling: Secondary | ICD-10-CM | POA: Diagnosis not present

## 2021-03-19 DIAGNOSIS — M6281 Muscle weakness (generalized): Secondary | ICD-10-CM | POA: Diagnosis not present

## 2021-03-19 DIAGNOSIS — R2681 Unsteadiness on feet: Secondary | ICD-10-CM | POA: Diagnosis not present

## 2021-03-20 DIAGNOSIS — I872 Venous insufficiency (chronic) (peripheral): Secondary | ICD-10-CM | POA: Diagnosis not present

## 2021-03-20 DIAGNOSIS — N1831 Chronic kidney disease, stage 3a: Secondary | ICD-10-CM | POA: Diagnosis not present

## 2021-03-20 DIAGNOSIS — D649 Anemia, unspecified: Secondary | ICD-10-CM | POA: Diagnosis not present

## 2021-03-20 DIAGNOSIS — Z0000 Encounter for general adult medical examination without abnormal findings: Secondary | ICD-10-CM | POA: Diagnosis not present

## 2021-03-20 DIAGNOSIS — Z8781 Personal history of (healed) traumatic fracture: Secondary | ICD-10-CM | POA: Diagnosis not present

## 2021-03-20 DIAGNOSIS — R197 Diarrhea, unspecified: Secondary | ICD-10-CM | POA: Diagnosis not present

## 2021-03-20 DIAGNOSIS — N400 Enlarged prostate without lower urinary tract symptoms: Secondary | ICD-10-CM | POA: Diagnosis not present

## 2021-03-20 DIAGNOSIS — Z7901 Long term (current) use of anticoagulants: Secondary | ICD-10-CM | POA: Diagnosis not present

## 2021-03-20 DIAGNOSIS — I480 Paroxysmal atrial fibrillation: Secondary | ICD-10-CM | POA: Diagnosis not present

## 2021-03-20 DIAGNOSIS — Z1389 Encounter for screening for other disorder: Secondary | ICD-10-CM | POA: Diagnosis not present

## 2021-03-20 DIAGNOSIS — D6869 Other thrombophilia: Secondary | ICD-10-CM | POA: Diagnosis not present

## 2021-03-20 DIAGNOSIS — D72819 Decreased white blood cell count, unspecified: Secondary | ICD-10-CM | POA: Diagnosis not present

## 2021-03-20 DIAGNOSIS — I10 Essential (primary) hypertension: Secondary | ICD-10-CM | POA: Diagnosis not present

## 2021-03-23 DIAGNOSIS — Z9181 History of falling: Secondary | ICD-10-CM | POA: Diagnosis not present

## 2021-03-23 DIAGNOSIS — M6281 Muscle weakness (generalized): Secondary | ICD-10-CM | POA: Diagnosis not present

## 2021-03-23 DIAGNOSIS — R2681 Unsteadiness on feet: Secondary | ICD-10-CM | POA: Diagnosis not present

## 2021-03-24 DIAGNOSIS — M6281 Muscle weakness (generalized): Secondary | ICD-10-CM | POA: Diagnosis not present

## 2021-03-24 DIAGNOSIS — Z9181 History of falling: Secondary | ICD-10-CM | POA: Diagnosis not present

## 2021-03-24 DIAGNOSIS — R2681 Unsteadiness on feet: Secondary | ICD-10-CM | POA: Diagnosis not present

## 2021-03-25 DIAGNOSIS — M6281 Muscle weakness (generalized): Secondary | ICD-10-CM | POA: Diagnosis not present

## 2021-03-25 DIAGNOSIS — R2681 Unsteadiness on feet: Secondary | ICD-10-CM | POA: Diagnosis not present

## 2021-03-25 DIAGNOSIS — Z9181 History of falling: Secondary | ICD-10-CM | POA: Diagnosis not present

## 2021-03-26 DIAGNOSIS — Z9181 History of falling: Secondary | ICD-10-CM | POA: Diagnosis not present

## 2021-03-26 DIAGNOSIS — M6281 Muscle weakness (generalized): Secondary | ICD-10-CM | POA: Diagnosis not present

## 2021-03-26 DIAGNOSIS — R2681 Unsteadiness on feet: Secondary | ICD-10-CM | POA: Diagnosis not present

## 2021-03-26 DIAGNOSIS — Z7901 Long term (current) use of anticoagulants: Secondary | ICD-10-CM | POA: Diagnosis not present

## 2021-03-27 DIAGNOSIS — M6281 Muscle weakness (generalized): Secondary | ICD-10-CM | POA: Diagnosis not present

## 2021-03-27 DIAGNOSIS — Z9181 History of falling: Secondary | ICD-10-CM | POA: Diagnosis not present

## 2021-03-27 DIAGNOSIS — R2681 Unsteadiness on feet: Secondary | ICD-10-CM | POA: Diagnosis not present

## 2021-04-02 ENCOUNTER — Telehealth: Payer: Self-pay

## 2021-04-02 DIAGNOSIS — D6869 Other thrombophilia: Secondary | ICD-10-CM | POA: Diagnosis not present

## 2021-04-02 DIAGNOSIS — Z7901 Long term (current) use of anticoagulants: Secondary | ICD-10-CM | POA: Diagnosis not present

## 2021-04-02 DIAGNOSIS — Z23 Encounter for immunization: Secondary | ICD-10-CM | POA: Diagnosis not present

## 2021-04-02 NOTE — Telephone Encounter (Signed)
Pt called and stated that they are [facility] but they keep getting out of range results. They asked if they could come here for just 1 visit to be fixed. I advised pt no because they are overdue for doctor harding and we would prefer to monitor longterm

## 2021-04-07 ENCOUNTER — Other Ambulatory Visit: Payer: Self-pay

## 2021-04-07 ENCOUNTER — Ambulatory Visit (INDEPENDENT_AMBULATORY_CARE_PROVIDER_SITE_OTHER): Payer: Medicare Other | Admitting: *Deleted

## 2021-04-07 DIAGNOSIS — I480 Paroxysmal atrial fibrillation: Secondary | ICD-10-CM

## 2021-04-07 DIAGNOSIS — Z952 Presence of prosthetic heart valve: Secondary | ICD-10-CM

## 2021-04-07 DIAGNOSIS — I059 Rheumatic mitral valve disease, unspecified: Secondary | ICD-10-CM

## 2021-04-07 LAB — POCT INR: INR: 1.6 — AB (ref 2.0–3.0)

## 2021-04-07 MED ORDER — PRESERVISION AREDS 2 PO CAPS: 1.0000 | ORAL_CAPSULE | Freq: Every day | ORAL | 0 refills | Status: AC

## 2021-04-10 DIAGNOSIS — L602 Onychogryphosis: Secondary | ICD-10-CM | POA: Diagnosis not present

## 2021-04-14 ENCOUNTER — Other Ambulatory Visit: Payer: Self-pay

## 2021-04-14 ENCOUNTER — Ambulatory Visit (INDEPENDENT_AMBULATORY_CARE_PROVIDER_SITE_OTHER): Payer: Medicare Other | Admitting: *Deleted

## 2021-04-14 ENCOUNTER — Other Ambulatory Visit: Payer: Self-pay | Admitting: Cardiology

## 2021-04-14 DIAGNOSIS — Z952 Presence of prosthetic heart valve: Secondary | ICD-10-CM

## 2021-04-14 DIAGNOSIS — I059 Rheumatic mitral valve disease, unspecified: Secondary | ICD-10-CM

## 2021-04-14 DIAGNOSIS — I480 Paroxysmal atrial fibrillation: Secondary | ICD-10-CM

## 2021-04-14 DIAGNOSIS — Z5181 Encounter for therapeutic drug level monitoring: Secondary | ICD-10-CM

## 2021-04-14 LAB — POCT INR: INR: 2.2 (ref 2.0–3.0)

## 2021-04-14 NOTE — Patient Instructions (Addendum)
Description   -Today take 1.5 tablets of warfarin -Then START taking warfarin 5mg  daily.  -Recheck INR in 1 week. [facility] [phone]

## 2021-04-22 ENCOUNTER — Ambulatory Visit (INDEPENDENT_AMBULATORY_CARE_PROVIDER_SITE_OTHER): Payer: Medicare Other

## 2021-04-22 ENCOUNTER — Other Ambulatory Visit: Payer: Self-pay

## 2021-04-22 DIAGNOSIS — Z5181 Encounter for therapeutic drug level monitoring: Secondary | ICD-10-CM

## 2021-04-22 DIAGNOSIS — I059 Rheumatic mitral valve disease, unspecified: Secondary | ICD-10-CM | POA: Diagnosis not present

## 2021-04-22 DIAGNOSIS — Z952 Presence of prosthetic heart valve: Secondary | ICD-10-CM

## 2021-04-22 DIAGNOSIS — I480 Paroxysmal atrial fibrillation: Secondary | ICD-10-CM

## 2021-04-22 LAB — POCT INR: INR: 3.9 — AB (ref 2.0–3.0)

## 2021-04-22 NOTE — Patient Instructions (Signed)
-  Hold today only -Then continue taking warfarin 5mg  daily.  -Recheck INR in 1 week. [facility] [phone]

## 2021-04-23 DIAGNOSIS — D649 Anemia, unspecified: Secondary | ICD-10-CM | POA: Diagnosis not present

## 2021-04-27 NOTE — Progress Notes (Signed)
Cardiology Office Note   Date:  04/28/2021   ID:  Richard Spears, DOB [birth year], MRN [id number]  PCP:  Virgie Dad, MD  Cardiologist:  Glenetta Hew, MD EP: None  Chief Complaint  Patient presents with   Follow-up    Atrial fibrillation, MVR      History of Present Illness: Richard Spears is a 87 y.o. male with a PMH of permanent atrial fibrillation, s/p mechanical MVR in 1997, HTN, HLD, chronic venous insufficiency, who presents for annual follow-up.  He was last evaluated by cardiology at an outpatient visit with Dr. Ellyn Hack 01/2020 at which time he was felt to be doing fairly well overall.  He had some hip pain which was limiting activity.  He reported a fall a couple weeks prior with mild injury to his right knee.  He reported occasional palpitations but otherwise was without significant angina or volume overload complaints.  No medication changes occurred and he was recommended to follow-up in 1 year.  His last echocardiogram 12/2018 showed EF 55 to 60%, moderate LVH, indeterminate LV diastolic function, elevated LVEDP, no R WMA, severe biatrial enlargement, stable mitral valve s/p mechanical MVR, and mild to moderate AI.    Since his last visit he did suffer a mechanical fall 08/2020 resulting in a femur fracture which she had surgically repaired.  He has had a pretty lengthy recovery and has been working with physical therapy up until yesterday when he felt like he had maximized benefits.  He is hopeful to continue maintaining his exercise regimen going forward.  His goal is to get back to driving so as to have more independence.  From a cardiac standpoint he has been doing well.  Has had some trouble maintaining stable INRs with plans to have INR checked today with our Coumadin clinic.  He reports stable chronic lower extremity edema which is managed with compression stockings.  He has no complaints of chest pain, shortness of breath, DOE, palpitations, dizziness,  lightheadedness, syncope, orthopnea, PND.    Past Medical History:  Diagnosis Date   Anemia    leakoppenia   BPH (benign prostatic hypertrophy)    Bullous pemphigoid    [name], March 2011, right forearm squamous cell carcinoma   Chronic anticoagulation    systemic   Colon polyp    transverse, 2002   History of peptic ulcer    remote, 3/95   Hx of actinic keratosis    Hx of basal cell carcinoma    Hx of squamous cell carcinoma of skin    Hyperlipidemia    Left inguinal hernia    Moderate aortic insufficiency 2009   audible aortic insufficiency on 1/09 echo   PAF (paroxysmal atrial fibrillation) ([place]) 01/17/2014   On Warfarin.   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 Jude mechanical valve   ORIF FEMUR FRACTURE Left 08/28/2020   Procedure: OPEN REDUCTION INTERNAL FIXATION (ORIF) DISTAL FEMUR FRACTURE;  Surgeon: Rod Can, MD;  Location: [facility];  Service: Orthopedics;  Laterality: Left;   TOTAL  HIP ARTHROPLASTY Right 10/12/2017   Procedure: RIGHT TOTAL HIP ARTHROPLASTY ANTERIOR APPROACH;  Surgeon: Gaynelle Arabian, MD;  Location: [facility];  Service: Orthopedics;  Laterality: Right;   TRANSTHORACIC ECHOCARDIOGRAM  12/2018   Unable to assess diastolic function because of A. fib. Normal RV function, but moderately elevated RVSP.  Severe biatrial enlargement. S/P St Jude bileaflet mechanical MVR that appears to be functioning normally. Mitral valve regurgitation cannot assess due to mechanical valve shadowing. MV Mean grad: 7.0 mmHg MV Area (PHT): 3.38 cm (stable for valve).  Mild Ao Sclerosis,  Mild-Mod AI   TRANSTHORACIC ECHOCARDIOGRAM  08/'17; 10/'18    a) Mild conc LVH. EF 55-60%. No RWMA. Mod AI. Mechanical MV prosthesis functioning properly. LAD dilation.;; b)  EF 55-60%.  Mo AI.  Bileaflet Saint Jude mechanical MV with no paravalvular leak.  Severe LA dilation.  Minimally elevated PAP     Current Outpatient Medications  Medication Sig Dispense Refill   acetaminophen (TYLENOL) 500 MG tablet Take 1,000 mg by mouth 3 (three) times daily as needed.     fluticasone (FLONASE) 50 MCG/ACT nasal spray Place 1 spray into both nostrils daily as needed for allergies or rhinitis.     isosorbide mononitrate (IMDUR) 30 MG 24 hr tablet Take 15 mg by mouth daily.     lactose free nutrition (BOOST) LIQD Take 237 mLs by mouth 2 (two) times daily between meals.     LORazepam (ATIVAN) 1 MG tablet Take 1 tablet (1 mg total) by mouth daily as needed for anxiety. 30 tablet 0   Multiple Vitamins-Minerals (MULTIVITAMIN PO) Take 1 tablet by mouth daily.     Multiple Vitamins-Minerals (PRESERVISION AREDS 2) CAPS Take 1 capsule by mouth daily. 90 capsule 0   polyethylene glycol (MIRALAX / GLYCOLAX) 17 g packet Take 17 g by mouth daily as needed.     tamsulosin (FLOMAX) 0.4 MG CAPS capsule Take 0.8 mg by mouth at bedtime.     warfarin (COUMADIN) 5 MG tablet TAKE ONE TO ONE AND ONE-HALF TABLETS DAILY AS DIRECTED 120 tablet 3   No current facility-administered medications for this visit.    Allergies:   Flexeril [cyclobenzaprine]    Social History:  The patient  reports that he quit smoking about 62 years ago. His smoking use included cigarettes. He started smoking about 75 years ago. He has a 13.00 pack-year smoking history. He has never used smokeless tobacco. He reports current alcohol use. He reports that he does not use drugs.   Family History:  The patient's family history includes COPD in his brother; Cancer in his brother; Hypertension in his mother; Lung cancer in his sister; Lupus in his son;  Other in his sister.    ROS:  Please see the history of present illness.   Otherwise, review of systems are positive for none.   All other systems are reviewed and negative.    PHYSICAL EXAM: VS:  BP 118/60 (BP Location: Left Arm, Patient Position: Sitting, Cuff Size: Normal)   Pulse 69   Ht 6' (1.829 m)   Wt 150 lb 3.2 oz (68.1 kg)   SpO2 97%   BMI 20.37 kg/m  , BMI Body mass index is 20.37 kg/m. GEN: Well nourished, well developed, in no acute distress HEENT: Sclera anicteric Neck: no JVD, carotid bruits, or masses Cardiac: IRIR; + murmurs/click, rubs, or gallops, trace LE edema  Respiratory:  clear to auscultation bilaterally, normal work of breathing GI: soft, nontender, nondistended, + BS MS: no deformity or  atrophy Skin: warm and dry, no rash Neuro:  Strength and sensation are intact Psych: euthymic mood, full affect   EKG:  EKG is ordered today. The ekg ordered today demonstrates atrial fibrillation, rate 69 bpm, no STE/D, no significant change from previous.    Recent Labs: 09/02/2020: Magnesium 2.1 10/15/2020: TSH 2.39 10/23/2020: ALT 96; BUN 33; Creatinine 1.2; Potassium 4.6; Sodium 134 02/05/2021: Hemoglobin 10.0; Platelets 145    Lipid Panel No results found for: CHOL, TRIG, HDL, CHOLHDL, VLDL, LDLCALC, LDLDIRECT    Wt Readings from Last 3 Encounters:  04/28/21 150 lb 3.2 oz (68.1 kg)  03/06/21 158 lb 11.2 oz (72 kg)  02/03/21 151 lb (68.5 kg)      Other studies Reviewed: Additional studies/ records that were reviewed today include:   Echocardiogram 2020: 1. The left ventricle has normal systolic function, with an ejection  fraction of 55-60%. The cavity size was normal. There is moderately  increased left ventricular wall thickness. Left ventricular diastolic  function could not be evaluated secondary to  atrial fibrillation. Elevated left ventricular end-diastolic pressure No  evidence of left ventricular regional wall motion abnormalities.   2. The  right ventricle has normal systolic function. The cavity was  severely enlarged. There is no increase in right ventricular wall  thickness. Right ventricular systolic pressure is moderately elevated.  PASP estimated at 58mmHg.   3. Left atrial size was severely dilated.   4. Right atrial size was severely dilated.   5. S/P St Jude bileaflet mechanical MVR that appears to be functioning  normally. Mitral valve regurgitation cannot assess due to mechanical valve  shadowing. MV Mean grad: 7.0 mmHg MV Area (PHT): 3.38 cm.   6. The aortic valve is tricuspid. Mild sclerosis of the aortic valve.  Aortic valve regurgitation is mild to moderate by color flow Doppler.   7. The aorta is normal in size and structure.   8. The inferior vena cava was dilated in size with <50% respiratory  variability. 9. Compared to prior echo, PASP has increased.     ASSESSMENT AND PLAN:  1. Permanent atrial fibrillation: EKG with Afib with rate 69 bpm today.  He is not on any AV nodal blocking agents. -Continue Coumadin for stroke Ppx -To have INR checked following today's visit  2. S/p mechanical MVR: He has stable symptom profile with well-controlled lower extremity edema with use of compression stockings and no complaints of shortness of breath, orthopnea, or PND.  We discussed surveillance echocardiogram monitoring though given lack of change in symptoms, euvolemic exam, and stable murmur do not feel strongly about updating imaging at this time as it would likely unchanged management.  He would prefer to limit outpatient appointments if possible. -We will continue watchful waiting-if patient notices change in symptoms he will contact the office at which time additional imaging could be considered -Continue SBE Ppx  3. HTN: BP 118/60 today. Not on any antihypertensive medications -Continue to monitor for now  4. HLD: No recent lipids on file. Not on statin medication. Unlikely to benefit meaningfully from  statins at this time.  -Continue dietary/lifestyle modifications to lower cholesterol  5. Chronic venous insufficiency: Stable -Continue supportive care with compression stockings -Low-salt diet encouraged   Current medicines are reviewed at length with the patient today.  The patient does not have concerns regarding medicines.  The following changes have been made: As above  Labs/ tests ordered today include:   Orders Placed This Encounter  Procedures   EKG  12-Lead     Disposition:   FU with Dr. Ellyn Hack in 1 year  Signed, Abigail Butts, PA-C  04/28/2021 10:53 AM

## 2021-04-28 ENCOUNTER — Ambulatory Visit (INDEPENDENT_AMBULATORY_CARE_PROVIDER_SITE_OTHER): Payer: Medicare Other | Admitting: Medical

## 2021-04-28 ENCOUNTER — Encounter: Payer: Self-pay | Admitting: Medical

## 2021-04-28 ENCOUNTER — Other Ambulatory Visit: Payer: Self-pay

## 2021-04-28 ENCOUNTER — Ambulatory Visit (INDEPENDENT_AMBULATORY_CARE_PROVIDER_SITE_OTHER): Payer: Medicare Other | Admitting: Pharmacist Clinician (PhC)/ Clinical Pharmacy Specialist

## 2021-04-28 VITALS — BP 118/60 | HR 69 | Ht 72.0 in | Wt 150.2 lb

## 2021-04-28 DIAGNOSIS — I059 Rheumatic mitral valve disease, unspecified: Secondary | ICD-10-CM | POA: Diagnosis not present

## 2021-04-28 DIAGNOSIS — I10 Essential (primary) hypertension: Secondary | ICD-10-CM | POA: Diagnosis not present

## 2021-04-28 DIAGNOSIS — Z952 Presence of prosthetic heart valve: Secondary | ICD-10-CM | POA: Diagnosis not present

## 2021-04-28 DIAGNOSIS — I480 Paroxysmal atrial fibrillation: Secondary | ICD-10-CM | POA: Diagnosis not present

## 2021-04-28 DIAGNOSIS — I4811 Longstanding persistent atrial fibrillation: Secondary | ICD-10-CM | POA: Diagnosis not present

## 2021-04-28 LAB — POCT INR: INR: 3.5 — AB (ref 2.0–3.0)

## 2021-04-28 NOTE — Patient Instructions (Signed)
Medication Instructions:  Your physician recommends that you continue on your current medications as directed. Please refer to the Current Medication list given to you today.  *If you need a refill on your cardiac medications before your next appointment, please call your pharmacy*   Lab Work: None If you have labs (blood work) drawn today and your tests are completely normal, you will receive your results only by: [place] (if you have MyChart) OR A paper copy in the mail If you have any lab test that is abnormal or we need to change your treatment, we will call you to review the results.   Testing/Procedures: None   Follow-Up: At [facility], you and your health needs are our priority.  As part of our continuing mission to provide you with exceptional heart care, we have created designated Provider Care Teams.  These Care Teams include your primary Cardiologist (physician) and Advanced Practice Providers (APPs -  Physician Assistants and Nurse Practitioners) who all work together to provide you with the care you need, when you need it.  We recommend signing up for the patient portal called "MyChart".  Sign up information is provided on this After Visit Summary.  MyChart is used to connect with patients for Virtual Visits (Telemedicine).  Patients are able to view lab/test results, encounter notes, upcoming appointments, etc.  Non-urgent messages can be sent to your provider as well.   To learn more about what you can do with MyChart, go to [email or web address].    Your next appointment:   1 year(s)  The format for your next appointment:   In Person  Provider:   Glenetta Hew, MD   Other Instructions

## 2021-05-11 ENCOUNTER — Telehealth: Payer: Self-pay | Admitting: Cardiology

## 2021-05-11 NOTE — Telephone Encounter (Signed)
New Message:    Patient said he was told that his Bisoprolol and Furosemide was not filled. He wanted to know why it was not refilled.

## 2021-05-11 NOTE — Telephone Encounter (Signed)
Returned call to patient, patient states he needs refills on bisoprolol 5 mg and lasix 40 mg daily.  Advised meds are not on current med list.   We were not aware he was taking these at last [place] 11/1.    He states he has been taking these for years.    Advised would review chart and sent to Roby Lofts PA to review.    Per chart review:  lasix d/c 10/16/20 due to elevated kidney function Bisoprolol d/c 10/23/20 due to low BP Both discontinue by Dr. Lyndel Safe.    Attempt to call back to discuss further.   No answer and unable to leave VM.  Routed to PA to review.

## 2021-05-13 ENCOUNTER — Other Ambulatory Visit: Payer: Self-pay

## 2021-05-13 ENCOUNTER — Ambulatory Visit (INDEPENDENT_AMBULATORY_CARE_PROVIDER_SITE_OTHER): Payer: Medicare Other

## 2021-05-13 DIAGNOSIS — I480 Paroxysmal atrial fibrillation: Secondary | ICD-10-CM

## 2021-05-13 DIAGNOSIS — Z5181 Encounter for therapeutic drug level monitoring: Secondary | ICD-10-CM | POA: Diagnosis not present

## 2021-05-13 DIAGNOSIS — Z952 Presence of prosthetic heart valve: Secondary | ICD-10-CM

## 2021-05-13 DIAGNOSIS — I059 Rheumatic mitral valve disease, unspecified: Secondary | ICD-10-CM

## 2021-05-13 LAB — POCT INR: INR: 4.9 — AB (ref 2.0–3.0)

## 2021-05-13 NOTE — Patient Instructions (Signed)
HOLD  tonight only and then continue taking warfarin 5mg  daily.  -Recheck INR in 2 weeks. Coumadin Clinic [facility] [phone]

## 2021-05-18 ENCOUNTER — Telehealth: Payer: Self-pay | Admitting: Cardiology

## 2021-05-18 NOTE — Telephone Encounter (Signed)
This is a tough situation because I had not seen him since August 2021.  He was just seen by Cherlynn Polo earlier this month and there was no comment about either bisoprolol or Lasix in that report.  However it appears that he has been taking it.  This shows one of the endemic problems in our clinic and that nobody clearly knows what medicines patients are taking despite the fact that come in with a list of that is reviewed.  I would say, that if he was truly taking bisoprolol and Lasix at the time of that visit, then sure we should continue on it, but the person who stopped that she would want answer that question not somebody who has not seen him in over a year.  Glenetta Hew, MD

## 2021-05-18 NOTE — Telephone Encounter (Signed)
Spoke with patient of Dr. Ellyn Hack who reports he was never told to stop taking bisoprolol or lasix. Explained that Dr. Lyndel Safe d/c'ed per previous RN's notes back in April 2022. He reports he has been taking all along. He resides at [facility] and was in the nursing center part of this facility in April after he broke a bone.   He reports his home BP fluctuates - will check regularly  He reports unilateral leg swelling while in the nursing center part of [facility], but no swelling at present time  If MD is OK refilling meds (not on list), he uses [facility]. He has enough of the supply for about 2 weeks  Routed to [place] MD

## 2021-05-18 NOTE — Telephone Encounter (Signed)
Pt c/o medication issue:  1. Name of Medication:   2. How are you currently taking this medication (dosage and times per day)?    3. Are you having a reaction (difficulty breathing--STAT)? no  4. What is your medication issue? Has question bout the medication he is stating. They are need clarity on what he shouldn't and should be taking

## 2021-05-18 NOTE — Telephone Encounter (Signed)
Spoke with patient about bisoprolol and furosemide per duplicate message and was routed to MD to review

## 2021-05-18 NOTE — Telephone Encounter (Signed)
Patient returned call

## 2021-05-19 MED ORDER — FUROSEMIDE 40 MG PO TABS: ORAL_TABLET | ORAL | 3 refills | Status: DC

## 2021-05-19 MED ORDER — BISOPROLOL FUMARATE 5 MG PO TABS: 5.0000 mg | ORAL_TABLET | Freq: Every day | ORAL | 3 refills | Status: AC

## 2021-05-19 NOTE — Telephone Encounter (Signed)
Spoke to patient . Patient states he has been taking medication  every since he was discharge from  [facility] .  Patient states blood pressure has been running in 953'U  systolic.   Rn informed patient per Dr Ellyn Hack will refill medication .   Direction obtained from  current bottle. RN informed patient always bring current medication bottles to all doctor office visit  or correct medication list . Patient voiced understanding

## 2021-05-27 ENCOUNTER — Ambulatory Visit (INDEPENDENT_AMBULATORY_CARE_PROVIDER_SITE_OTHER): Payer: Medicare Other

## 2021-05-27 ENCOUNTER — Other Ambulatory Visit: Payer: Self-pay

## 2021-05-27 DIAGNOSIS — Z5181 Encounter for therapeutic drug level monitoring: Secondary | ICD-10-CM

## 2021-05-27 DIAGNOSIS — Z952 Presence of prosthetic heart valve: Secondary | ICD-10-CM

## 2021-05-27 LAB — POCT INR: INR: 4.6 — AB (ref 2.0–3.0)

## 2021-05-27 NOTE — Patient Instructions (Signed)
Description   HOLD  tonight only and then START taking warfarin 5mg  daily except 2.5mg  (1/2 tablet) every Monday and Friday.  -Recheck INR in 2 weeks. Cou[facility]in Clinic [facility] [phone]

## 2021-06-01 ENCOUNTER — Telehealth: Payer: Self-pay

## 2021-06-10 ENCOUNTER — Ambulatory Visit (INDEPENDENT_AMBULATORY_CARE_PROVIDER_SITE_OTHER): Payer: Medicare Other

## 2021-06-10 ENCOUNTER — Other Ambulatory Visit: Payer: Self-pay

## 2021-06-10 DIAGNOSIS — I480 Paroxysmal atrial fibrillation: Secondary | ICD-10-CM

## 2021-06-10 DIAGNOSIS — Z5181 Encounter for therapeutic drug level monitoring: Secondary | ICD-10-CM | POA: Diagnosis not present

## 2021-06-10 DIAGNOSIS — Z952 Presence of prosthetic heart valve: Secondary | ICD-10-CM

## 2021-06-10 DIAGNOSIS — I059 Rheumatic mitral valve disease, unspecified: Secondary | ICD-10-CM | POA: Diagnosis not present

## 2021-06-10 LAB — POCT INR: INR: 2.8 (ref 2.0–3.0)

## 2021-06-10 NOTE — Patient Instructions (Signed)
Continue taking warfarin 5mg  daily except 2.5mg  (1/2 tablet) every Monday and Friday.  -Recheck INR in 6 weeks. Coumadin Clinic [facility] [phone]

## 2021-06-15 ENCOUNTER — Other Ambulatory Visit: Payer: Self-pay | Admitting: *Deleted

## 2021-06-16 ENCOUNTER — Telehealth: Payer: Self-pay | Admitting: Cardiology

## 2021-06-16 NOTE — Telephone Encounter (Signed)
Pt c/o medication issue:  1. Name of Medication: HYDRALAZINE 25 MG  2. How are you currently taking this medication (dosage and times per day)? AS DIRECTED  3. Are you having a reaction (difficulty breathing--STAT)? NO  4. What is your medication issue? PT IS VERIFYING IF DR. HARDING IS THE PRESCRIBER FOR HIS HYDRALAZINE 25MG  MEDICINE. PT SAID THE LAST TIME THIS MEDICINE WAS PRESCRIBED TO HIM WAS 06/17/2020

## 2021-06-16 NOTE — Telephone Encounter (Signed)
Spoke with pt regarding Hydralazine prescription. Pt states that he has been on the medication for years, however it appears that this medication was taken off his list by another provider. Pt states that his blood pressure at home have been good and he would just like to have medication refilled. Will send message to Dr. Ellyn Hack to approve refill. Pt verbalizes understanding. Pt requests that if we are given the ok to refill medication to just go ahead and [facility].

## 2021-06-17 MED ORDER — HYDRALAZINE HCL 25 MG PO TABS: ORAL_TABLET | ORAL | 3 refills | Status: DC

## 2021-06-17 NOTE — Telephone Encounter (Signed)
Not sure why the other provider stopped it.  The plan was for him to take it once daily at lunch and then as needed for elevated blood pressures.  If at a minimum he has it for PRN dosing.  - details on how to take in my last note.  Glenetta Hew, MD

## 2021-06-17 NOTE — Telephone Encounter (Signed)
Prescription   e-scrbed to mail order

## 2021-06-17 NOTE — Telephone Encounter (Signed)
Spoke to patient. He has been taking 25 mg at lunch daily  and will take an extra tablet if needed of systolic 544 .  Prescription was [facility] as patient requested.

## 2021-06-24 ENCOUNTER — Inpatient Hospital Stay (HOSPITAL_COMMUNITY)
Admission: EM | Admit: 2021-06-24 | Discharge: 2021-07-17 | DRG: 330 | Disposition: A | Discharge status: Skilled Nursing Facility | Payer: Medicare Other | Source: Skilled Nursing Facility | Attending: Internal Medicine | Admitting: Internal Medicine

## 2021-06-24 ENCOUNTER — Emergency Department (HOSPITAL_COMMUNITY): Payer: Medicare Other

## 2021-06-24 ENCOUNTER — Other Ambulatory Visit: Payer: Self-pay

## 2021-06-24 ENCOUNTER — Encounter (HOSPITAL_COMMUNITY): Payer: Self-pay | Admitting: *Deleted

## 2021-06-24 DIAGNOSIS — J90 Pleural effusion, not elsewhere classified: Secondary | ICD-10-CM | POA: Diagnosis not present

## 2021-06-24 DIAGNOSIS — I472 Ventricular tachycardia, unspecified: Secondary | ICD-10-CM | POA: Diagnosis not present

## 2021-06-24 DIAGNOSIS — D6832 Hemorrhagic disorder due to extrinsic circulating anticoagulants: Secondary | ICD-10-CM | POA: Diagnosis not present

## 2021-06-24 DIAGNOSIS — L89312 Pressure ulcer of right buttock, stage 2: Secondary | ICD-10-CM | POA: Diagnosis present

## 2021-06-24 DIAGNOSIS — Z5181 Encounter for therapeutic drug level monitoring: Secondary | ICD-10-CM | POA: Diagnosis not present

## 2021-06-24 DIAGNOSIS — Z79899 Other long term (current) drug therapy: Secondary | ICD-10-CM

## 2021-06-24 DIAGNOSIS — N179 Acute kidney failure, unspecified: Secondary | ICD-10-CM | POA: Diagnosis present

## 2021-06-24 DIAGNOSIS — E871 Hypo-osmolality and hyponatremia: Secondary | ICD-10-CM | POA: Diagnosis not present

## 2021-06-24 DIAGNOSIS — R58 Hemorrhage, not elsewhere classified: Secondary | ICD-10-CM | POA: Diagnosis not present

## 2021-06-24 DIAGNOSIS — Z87891 Personal history of nicotine dependence: Secondary | ICD-10-CM

## 2021-06-24 DIAGNOSIS — T45515A Adverse effect of anticoagulants, initial encounter: Secondary | ICD-10-CM | POA: Diagnosis not present

## 2021-06-24 DIAGNOSIS — I10 Essential (primary) hypertension: Secondary | ICD-10-CM | POA: Diagnosis not present

## 2021-06-24 DIAGNOSIS — E785 Hyperlipidemia, unspecified: Secondary | ICD-10-CM | POA: Diagnosis not present

## 2021-06-24 DIAGNOSIS — R531 Weakness: Secondary | ICD-10-CM | POA: Diagnosis not present

## 2021-06-24 DIAGNOSIS — L97919 Non-pressure chronic ulcer of unspecified part of right lower leg with unspecified severity: Secondary | ICD-10-CM | POA: Diagnosis present

## 2021-06-24 DIAGNOSIS — F419 Anxiety disorder, unspecified: Secondary | ICD-10-CM | POA: Diagnosis present

## 2021-06-24 DIAGNOSIS — R2681 Unsteadiness on feet: Secondary | ICD-10-CM | POA: Diagnosis not present

## 2021-06-24 DIAGNOSIS — K922 Gastrointestinal hemorrhage, unspecified: Secondary | ICD-10-CM | POA: Diagnosis not present

## 2021-06-24 DIAGNOSIS — C189 Malignant neoplasm of colon, unspecified: Secondary | ICD-10-CM | POA: Diagnosis not present

## 2021-06-24 DIAGNOSIS — I5032 Chronic diastolic (congestive) heart failure: Secondary | ICD-10-CM | POA: Diagnosis present

## 2021-06-24 DIAGNOSIS — Z86007 Personal history of in-situ neoplasm of skin: Secondary | ICD-10-CM

## 2021-06-24 DIAGNOSIS — D62 Acute posthemorrhagic anemia: Secondary | ICD-10-CM | POA: Diagnosis not present

## 2021-06-24 DIAGNOSIS — R1313 Dysphagia, pharyngeal phase: Secondary | ICD-10-CM | POA: Diagnosis not present

## 2021-06-24 DIAGNOSIS — R933 Abnormal findings on diagnostic imaging of other parts of digestive tract: Secondary | ICD-10-CM | POA: Diagnosis not present

## 2021-06-24 DIAGNOSIS — M79601 Pain in right arm: Secondary | ICD-10-CM | POA: Diagnosis not present

## 2021-06-24 DIAGNOSIS — D696 Thrombocytopenia, unspecified: Secondary | ICD-10-CM | POA: Diagnosis not present

## 2021-06-24 DIAGNOSIS — I480 Paroxysmal atrial fibrillation: Secondary | ICD-10-CM | POA: Diagnosis not present

## 2021-06-24 DIAGNOSIS — K6389 Other specified diseases of intestine: Secondary | ICD-10-CM | POA: Diagnosis not present

## 2021-06-24 DIAGNOSIS — E222 Syndrome of inappropriate secretion of antidiuretic hormone: Secondary | ICD-10-CM | POA: Diagnosis present

## 2021-06-24 DIAGNOSIS — I7772 Dissection of iliac artery: Secondary | ICD-10-CM | POA: Diagnosis not present

## 2021-06-24 DIAGNOSIS — I129 Hypertensive chronic kidney disease with stage 1 through stage 4 chronic kidney disease, or unspecified chronic kidney disease: Secondary | ICD-10-CM | POA: Diagnosis not present

## 2021-06-24 DIAGNOSIS — Z96643 Presence of artificial hip joint, bilateral: Secondary | ICD-10-CM | POA: Diagnosis present

## 2021-06-24 DIAGNOSIS — R000 Tachycardia, unspecified: Secondary | ICD-10-CM | POA: Diagnosis not present

## 2021-06-24 DIAGNOSIS — I700 Atherosclerosis of aorta: Secondary | ICD-10-CM | POA: Diagnosis not present

## 2021-06-24 DIAGNOSIS — Z743 Need for continuous supervision: Secondary | ICD-10-CM | POA: Diagnosis not present

## 2021-06-24 DIAGNOSIS — N183 Chronic kidney disease, stage 3 unspecified: Secondary | ICD-10-CM | POA: Diagnosis present

## 2021-06-24 DIAGNOSIS — Z85828 Personal history of other malignant neoplasm of skin: Secondary | ICD-10-CM | POA: Diagnosis not present

## 2021-06-24 DIAGNOSIS — Z8711 Personal history of peptic ulcer disease: Secondary | ICD-10-CM

## 2021-06-24 DIAGNOSIS — N400 Enlarged prostate without lower urinary tract symptoms: Secondary | ICD-10-CM | POA: Diagnosis present

## 2021-06-24 DIAGNOSIS — Z66 Do not resuscitate: Secondary | ICD-10-CM | POA: Diagnosis present

## 2021-06-24 DIAGNOSIS — Z952 Presence of prosthetic heart valve: Secondary | ICD-10-CM

## 2021-06-24 DIAGNOSIS — I4811 Longstanding persistent atrial fibrillation: Secondary | ICD-10-CM

## 2021-06-24 DIAGNOSIS — R0902 Hypoxemia: Secondary | ICD-10-CM | POA: Diagnosis not present

## 2021-06-24 DIAGNOSIS — M6281 Muscle weakness (generalized): Secondary | ICD-10-CM | POA: Diagnosis not present

## 2021-06-24 DIAGNOSIS — I774 Celiac artery compression syndrome: Secondary | ICD-10-CM | POA: Diagnosis not present

## 2021-06-24 DIAGNOSIS — N1831 Chronic kidney disease, stage 3a: Secondary | ICD-10-CM | POA: Diagnosis not present

## 2021-06-24 DIAGNOSIS — I82890 Acute embolism and thrombosis of other specified veins: Secondary | ICD-10-CM | POA: Diagnosis not present

## 2021-06-24 DIAGNOSIS — N1832 Chronic kidney disease, stage 3b: Secondary | ICD-10-CM | POA: Diagnosis not present

## 2021-06-24 DIAGNOSIS — D490 Neoplasm of unspecified behavior of digestive system: Secondary | ICD-10-CM | POA: Diagnosis not present

## 2021-06-24 DIAGNOSIS — L03113 Cellulitis of right upper limb: Secondary | ICD-10-CM | POA: Diagnosis not present

## 2021-06-24 DIAGNOSIS — D649 Anemia, unspecified: Secondary | ICD-10-CM | POA: Diagnosis not present

## 2021-06-24 DIAGNOSIS — Z888 Allergy status to other drugs, medicaments and biological substances status: Secondary | ICD-10-CM

## 2021-06-24 DIAGNOSIS — I83009 Varicose veins of unspecified lower extremity with ulcer of unspecified site: Secondary | ICD-10-CM | POA: Diagnosis present

## 2021-06-24 DIAGNOSIS — Z20822 Contact with and (suspected) exposure to covid-19: Secondary | ICD-10-CM | POA: Diagnosis present

## 2021-06-24 DIAGNOSIS — I130 Hypertensive heart and chronic kidney disease with heart failure and stage 1 through stage 4 chronic kidney disease, or unspecified chronic kidney disease: Secondary | ICD-10-CM | POA: Diagnosis present

## 2021-06-24 DIAGNOSIS — K921 Melena: Secondary | ICD-10-CM | POA: Diagnosis not present

## 2021-06-24 DIAGNOSIS — K625 Hemorrhage of anus and rectum: Secondary | ICD-10-CM | POA: Diagnosis not present

## 2021-06-24 DIAGNOSIS — J9811 Atelectasis: Secondary | ICD-10-CM | POA: Diagnosis not present

## 2021-06-24 DIAGNOSIS — K5669 Other partial intestinal obstruction: Secondary | ICD-10-CM | POA: Diagnosis not present

## 2021-06-24 DIAGNOSIS — C184 Malignant neoplasm of transverse colon: Principal | ICD-10-CM | POA: Diagnosis present

## 2021-06-24 DIAGNOSIS — R278 Other lack of coordination: Secondary | ICD-10-CM | POA: Diagnosis not present

## 2021-06-24 DIAGNOSIS — R935 Abnormal findings on diagnostic imaging of other abdominal regions, including retroperitoneum: Secondary | ICD-10-CM | POA: Diagnosis not present

## 2021-06-24 DIAGNOSIS — L97929 Non-pressure chronic ulcer of unspecified part of left lower leg with unspecified severity: Secondary | ICD-10-CM | POA: Diagnosis present

## 2021-06-24 DIAGNOSIS — L538 Other specified erythematous conditions: Secondary | ICD-10-CM | POA: Diagnosis not present

## 2021-06-24 DIAGNOSIS — Z954 Presence of other heart-valve replacement: Secondary | ICD-10-CM | POA: Diagnosis not present

## 2021-06-24 DIAGNOSIS — I82621 Acute embolism and thrombosis of deep veins of right upper extremity: Secondary | ICD-10-CM

## 2021-06-24 DIAGNOSIS — Z7901 Long term (current) use of anticoagulants: Secondary | ICD-10-CM

## 2021-06-24 DIAGNOSIS — N189 Chronic kidney disease, unspecified: Secondary | ICD-10-CM | POA: Diagnosis present

## 2021-06-24 DIAGNOSIS — M7989 Other specified soft tissue disorders: Secondary | ICD-10-CM | POA: Diagnosis not present

## 2021-06-24 DIAGNOSIS — I959 Hypotension, unspecified: Secondary | ICD-10-CM | POA: Diagnosis not present

## 2021-06-24 DIAGNOSIS — G4700 Insomnia, unspecified: Secondary | ICD-10-CM | POA: Diagnosis present

## 2021-06-24 DIAGNOSIS — Z483 Aftercare following surgery for neoplasm: Secondary | ICD-10-CM | POA: Diagnosis not present

## 2021-06-24 DIAGNOSIS — K4090 Unilateral inguinal hernia, without obstruction or gangrene, not specified as recurrent: Secondary | ICD-10-CM | POA: Diagnosis not present

## 2021-06-24 DIAGNOSIS — Z7401 Bed confinement status: Secondary | ICD-10-CM | POA: Diagnosis not present

## 2021-06-24 DIAGNOSIS — I119 Hypertensive heart disease without heart failure: Secondary | ICD-10-CM | POA: Diagnosis not present

## 2021-06-24 DIAGNOSIS — I701 Atherosclerosis of renal artery: Secondary | ICD-10-CM | POA: Diagnosis not present

## 2021-06-24 DIAGNOSIS — I340 Nonrheumatic mitral (valve) insufficiency: Secondary | ICD-10-CM | POA: Diagnosis not present

## 2021-06-24 DIAGNOSIS — I82611 Acute embolism and thrombosis of superficial veins of right upper extremity: Secondary | ICD-10-CM | POA: Diagnosis not present

## 2021-06-24 DIAGNOSIS — Z8249 Family history of ischemic heart disease and other diseases of the circulatory system: Secondary | ICD-10-CM

## 2021-06-24 LAB — COMPREHENSIVE METABOLIC PANEL
ALT: 17 U/L (ref 0–44)
AST: 28 U/L (ref 15–41)
Albumin: 2.8 g/dL — ABNORMAL LOW (ref 3.5–5.0)
Alkaline Phosphatase: 44 U/L (ref 38–126)
Anion gap: 5 (ref 5–15)
BUN: 31 mg/dL — ABNORMAL HIGH (ref 8–23)
CO2: 25 mmol/L (ref 22–32)
Calcium: 8.4 mg/dL — ABNORMAL LOW (ref 8.9–10.3)
Chloride: 102 mmol/L (ref 98–111)
Creatinine, Ser: 1.57 mg/dL — ABNORMAL HIGH (ref 0.61–1.24)
GFR, Estimated: 41 mL/min — ABNORMAL LOW (ref >60)
Glucose, Bld: 122 mg/dL — ABNORMAL HIGH (ref 70–99)
Potassium: 4.1 mmol/L (ref 3.5–5.1)
Sodium: 132 mmol/L — ABNORMAL LOW (ref 135–145)
Total Bilirubin: 0.6 mg/dL (ref 0.3–1.2)
Total Protein: 6.1 g/dL — ABNORMAL LOW (ref 6.5–8.1)

## 2021-06-24 LAB — CBC WITH DIFFERENTIAL/PLATELET
Abs Immature Granulocytes: 0.04 10*3/uL (ref 0.00–0.07)
Basophils Absolute: 0 10*3/uL (ref 0.0–0.1)
Basophils Relative: 1 %
Eosinophils Absolute: 0.1 10*3/uL (ref 0.0–0.5)
Eosinophils Relative: 2 %
HCT: 26.7 % — ABNORMAL LOW (ref 39.0–52.0)
Hemoglobin: 8.6 g/dL — ABNORMAL LOW (ref 13.0–17.0)
Immature Granulocytes: 1 %
Lymphocytes Relative: 16 %
Lymphs Abs: 0.6 10*3/uL — ABNORMAL LOW (ref 0.7–4.0)
MCH: 31.9 pg (ref 26.0–34.0)
MCHC: 32.2 g/dL (ref 30.0–36.0)
MCV: 98.9 fL (ref 80.0–100.0)
Monocytes Absolute: 0.5 10*3/uL (ref 0.1–1.0)
Monocytes Relative: 12 %
Neutro Abs: 2.5 10*3/uL (ref 1.7–7.7)
Neutrophils Relative %: 68 %
Platelets: 164 10*3/uL (ref 150–400)
RBC: 2.7 MIL/uL — ABNORMAL LOW (ref 4.22–5.81)
RDW: 16.5 % — ABNORMAL HIGH (ref 11.5–15.5)
WBC: 3.7 10*3/uL — ABNORMAL LOW (ref 4.0–10.5)
nRBC: 0 % (ref 0.0–0.2)

## 2021-06-24 LAB — TYPE AND SCREEN
ABO/RH(D): O POS
Antibody Screen: NEGATIVE

## 2021-06-24 LAB — HEMOGLOBIN AND HEMATOCRIT, BLOOD
HCT: 28.8 % — ABNORMAL LOW (ref 39.0–52.0)
Hemoglobin: 9 g/dL — ABNORMAL LOW (ref 13.0–17.0)

## 2021-06-24 LAB — PROTIME-INR
INR: 3.9 — ABNORMAL HIGH (ref 0.8–1.2)
Prothrombin Time: 38.4 seconds — ABNORMAL HIGH (ref 11.4–15.2)

## 2021-06-24 LAB — RESP PANEL BY RT-PCR (FLU A&B, COVID) ARPGX2
Influenza A by PCR: NEGATIVE
Influenza B by PCR: NEGATIVE
SARS Coronavirus 2 by RT PCR: NEGATIVE

## 2021-06-24 LAB — POC OCCULT BLOOD, ED: Fecal Occult Bld: POSITIVE — AB

## 2021-06-24 LAB — LIPASE, BLOOD: Lipase: 34 U/L (ref 11–51)

## 2021-06-24 MED ORDER — ACETAMINOPHEN 650 MG RE SUPP: 650.0000 mg | Freq: Four times a day (QID) | RECTAL | Status: DC | PRN

## 2021-06-24 MED ORDER — SODIUM CHLORIDE 0.9% FLUSH
3.0000 mL | Freq: Two times a day (BID) | INTRAVENOUS | Status: DC
  Administered 2021-06-25 – 2021-07-17 (×42): 3 mL via INTRAVENOUS

## 2021-06-24 MED ORDER — IOHEXOL 350 MG/ML SOLN
80.0000 mL | Freq: Once | INTRAVENOUS | Status: AC | PRN
  Administered 2021-06-24: 21:00:00 80 mL via INTRAVENOUS

## 2021-06-24 MED ORDER — ACETAMINOPHEN 325 MG PO TABS
650.0000 mg | ORAL_TABLET | Freq: Four times a day (QID) | ORAL | Status: DC | PRN
  Administered 2021-06-27 – 2021-06-29 (×3): 650 mg via ORAL
  Filled 2021-06-24 (×3): qty 2

## 2021-06-24 NOTE — ED Provider Notes (Signed)
[facility] EMERGENCY DEPARTMENT Provider Note   CSN: [id number] Arrival date & time: 06/24/21  1101     History Chief Complaint  Patient presents with   GI Problem    Richard Spears is a 87 y.o. male.  Patient with history of atrial fibrillation, mechanical , on chronic Coumadin, history of bleeding duodenal ulcer 1995 --presents to the emergency department for GI bleeding.  Patient is on iron supplements and typically has black stool.  Yesterday and this morning he noted red blood in the stool.  He felt weak today but did not pass out or feel lightheaded.  No associated chest pain or shortness of breath.  He presents for evaluation of GI bleeding.  He does not currently see a gastroenterologist but does follow with Dr. [name] of [facility] physicians.  He denies abdominal pain at the current time.  INR today 3.9.      Past Medical History:  Diagnosis Date   Anemia    leakoppenia   BPH (benign prostatic hypertrophy)    Bullous pemphigoid    Wilhemina Spears, March 2011, right forearm squamous cell carcinoma   Chronic anticoagulation    systemic   Colon polyp    transverse, 2002   History of peptic ulcer    remote, 3/95   Hx of actinic keratosis    Hx of basal cell carcinoma    Hx of squamous cell carcinoma of skin    Hyperlipidemia    Left inguinal hernia    Moderate aortic insufficiency 2009   audible aortic insufficiency on 1/09 echo   PAF (paroxysmal atrial fibrillation) ([place]) 01/17/2014   On Warfarin.   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 10/15/14   [place]    Patient Active Problem List   Diagnosis Date Noted   Normochromic normocytic anemia 02/03/2021   CKD (chronic kidney disease) stage 3, GFR 30-59 ml/min (HCC) 12/23/2020   Pressure ulcer of right buttock, stage 2 ([place]) 10/07/2020   UTI (urinary tract infection) 09/26/2020    Weight loss 09/23/2020   Insomnia 09/23/2020   Dysuria 09/23/2020   Hyponatremia 09/16/2020   BPH (benign prostatic hyperplasia) 09/03/2020   Generalized osteoarthritis of multiple sites 09/03/2020   Slow transit constipation 08/30/2020   Closed displaced fracture of distal epiphysis of left femur ([place]) 08/25/2020   Macrocytic anemia 08/25/2020   Fall 08/25/2020   Alcohol use 08/25/2020   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   Hip fracture ([place]) 10/09/2017   Essential hypertension 05/12/2017   Exertional dyspnea 01/18/2016   Venous insufficiency 02/14/2015   Cancer of skin of right lower leg 10/31/2014   AI (aortic insufficiency) 01/23/2014   Longstanding persistent atrial fibrillation: CHA2DS2-VASc Score 3 01/17/2014   Hyperlipidemia    S/P MVR (mitral valve replacement) 04/08/1996    Past Surgical History:  Procedure Laterality Date   Electrodesiccation and Curettage and Shave Biopsy Right    Right medial, anterio tibia: Well differentiated Squamous Cell   hip replacements Left    10 years ago   MITRAL VALVE REPLACEMENT  03/1996   St. Jude mechanical valve   ORIF FEMUR FRACTURE Left 08/28/2020   Procedure: OPEN REDUCTION INTERNAL FIXATION (ORIF) DISTAL FEMUR FRACTURE;  Surgeon: Rod Can, MD;  Location: [facility];  Service: Orthopedics;  Laterality: Left;   TOTAL HIP ARTHROPLASTY Right 10/12/2017   Procedure: RIGHT TOTAL HIP  ARTHROPLASTY ANTERIOR APPROACH;  Surgeon: Gaynelle Arabian, MD;  Location: [facility];  Service: Orthopedics;  Laterality: Right;   TRANSTHORACIC ECHOCARDIOGRAM  12/2018   Unable to assess diastolic function because of A. fib. Normal RV function, but moderately elevated RVSP.  Severe biatrial enlargement. S/P St Jude bileaflet mechanical MVR that appears to be functioning normally. Mitral valve regurgitation cannot assess due to mechanical valve shadowing. MV Mean grad: 7.0 mmHg MV Area (PHT): 3.38 cm (stable for valve).  Mild Ao Sclerosis, Mild-Mod AI    TRANSTHORACIC ECHOCARDIOGRAM  08/'17; 10/'18    a) Mild conc LVH. EF 55-60%. No RWMA. Mod AI. Mechanical MV prosthesis functioning properly. LAD dilation.;; b)  EF 55-60%.  Mo AI.  Bileaflet Saint Jude mechanical MV with no paravalvular leak.  Severe LA dilation.  Minimally elevated PAP       Family History  Problem Relation Age of Onset   Hypertension Mother    Lung cancer Sister    COPD Brother    Cancer Brother    Other Sister        polio   Lupus Son     Social History   Tobacco Use   Smoking status: Former    Packs/day: 1.00    Years: 13.00    Pack years: 13.00    Types: Cigarettes    Start date: 44    Quit date: 1960    Years since quitting: 63.0   Smokeless tobacco: Never  Vaping Use   Vaping Use: Never used  Substance Use Topics   Alcohol use: Yes    Alcohol/week: 0.0 standard drinks    Comment: 1-2 drinks per day   Drug use: Never    Home Medications Prior to Admission medications   Medication Sig Start Date End Date Taking? Authorizing Provider  acetaminophen (TYLENOL) 500 MG tablet Take 1,000 mg by mouth 3 (three) times daily as needed.    [provider]  bisoprolol (ZEBETA) 5 MG tablet Take 1 tablet (5 mg total) by mouth daily. 05/19/21   Leonie Man, MD  fluticasone [facility]) 50 MCG/ACT nasal spray Place 1 spray into both nostrils daily as needed for allergies or rhinitis.    [provider]  furosemide (LASIX) 40 MG tablet Take 40 mg tablet by mouth daily  except on Mondays ,Wednesdays and Fridays take an additional 20 mg ( 1/2 tablet)  by mouth which will be total of 60 mg. 05/19/21   Leonie Man, MD  hydrALAZINE (APRESOLINE) 25 MG tablet Take 25 mg by mouth daily at lunch, may take an  extra tablet as needed for systolic BP greater than 025 mmHg daily 06/17/21   Leonie Man, MD  isosorbide mononitrate (IMDUR) 30 MG 24 hr tablet Take 15 mg by mouth daily.    [provider]  lactose free nutrition (BOOST) LIQD  Take 237 mLs by mouth 2 (two) times daily between meals.    [provider]  LORazepam (ATIVAN) 1 MG tablet Take 1 tablet (1 mg total) by mouth daily as needed for anxiety. 02/24/21   [place], Amy E, NP  Multiple Vitamins-Minerals (MULTIVITAMIN PO) Take 1 tablet by mouth daily.    [provider]  Multiple Vitamins-Minerals (PRESERVISION AREDS 2) CAPS Take 1 capsule by mouth daily. 04/07/21   [place], Peter M, MD  polyethylene glycol (MIRALAX / GLYCOLAX) 17 g packet Take 17 g by mouth daily as needed.    [provider]  tamsulosin (FLOMAX) 0.4 MG CAPS capsule  Take 0.8 mg by mouth at bedtime.    [provider]  warfarin (COUMADIN) 5 MG tablet TAKE ONE TO ONE AND ONE-HALF TABLETS DAILY AS DIRECTED 04/15/21   Leonie Man, MD    Allergies    Flexeril [cyclobenzaprine]  Review of Systems   Review of Systems  Constitutional:  Negative for fever.  HENT:  Negative for rhinorrhea and sore throat.   Eyes:  Negative for redness.  Respiratory:  Negative for cough.   Cardiovascular:  Negative for chest pain.  Gastrointestinal:  Positive for blood in stool. Negative for abdominal pain, diarrhea, nausea and vomiting.  Genitourinary:  Negative for dysuria and hematuria.  Musculoskeletal:  Negative for myalgias.  Skin:  Negative for rash.  Neurological:  Positive for weakness (generalized). Negative for headaches.   Physical Exam Updated Vital Signs BP 139/67 (BP Location: Left Arm)    Pulse (!) 56    Temp 97.9 F (36.6 C) (Oral)    Resp 12    SpO2 99%   Physical Exam Vitals and nursing note reviewed. Exam conducted with a chaperone present.  Constitutional:      General: He is not in acute distress.    Appearance: He is well-developed.  HENT:     Head: Normocephalic and atraumatic.  Eyes:     General:        Right eye: No discharge.        Left eye: No discharge.     Conjunctiva/sclera: Conjunctivae normal.  Cardiovascular:     Rate and Rhythm: Normal  rate. Rhythm irregular.     Heart sounds: Normal heart sounds.  Pulmonary:     Effort: Pulmonary effort is normal.     Breath sounds: Normal breath sounds.  Abdominal:     Palpations: Abdomen is soft.     Tenderness: There is no abdominal tenderness. There is no guarding or rebound.  Genitourinary:    Rectum: Guaiac result positive. No tenderness, external hemorrhoid or internal hemorrhoid.     Comments: Maroon stool noted on glove during DRE Musculoskeletal:     Cervical back: Normal range of motion and neck supple.  Skin:    General: Skin is warm and dry.  Neurological:     Mental Status: He is alert.    ED Results / Procedures / Treatments   Labs (all labs ordered are listed, but only abnormal results are displayed) Labs Reviewed  CBC WITH DIFFERENTIAL/PLATELET - Abnormal; Notable for the following components:      Result Value   WBC 3.7 (*)    RBC 2.70 (*)    Hemoglobin 8.6 (*)    HCT 26.7 (*)    RDW 16.5 (*)    Lymphs Abs 0.6 (*)    All other components within normal limits  COMPREHENSIVE METABOLIC PANEL - Abnormal; Notable for the following components:   Sodium 132 (*)    Glucose, Bld 122 (*)    BUN 31 (*)    Creatinine, Ser 1.57 (*)    Calcium 8.4 (*)    Total Protein 6.1 (*)    Albumin 2.8 (*)    GFR, Estimated 41 (*)    All other components within normal limits  PROTIME-INR - Abnormal; Notable for the following components:   Prothrombin Time 38.4 (*)    INR 3.9 (*)    All other components within normal limits  HEMOGLOBIN AND HEMATOCRIT, BLOOD - Abnormal; Notable for the following components:   Hemoglobin 9.0 (*)    HCT 28.8 (*)  All other components within normal limits  POC OCCULT BLOOD, ED - Abnormal; Notable for the following components:   Fecal Occult Bld POSITIVE (*)    All other components within normal limits  RESP PANEL BY RT-PCR (FLU A&B, COVID) ARPGX2  LIPASE, BLOOD    EKG EKG Interpretation  Date/Time:  Wednesday June 24 2021  12:21:51 EST Ventricular Rate:  77 PR Interval:    QRS Duration: 104 QT Interval:  450 QTC Calculation: 509 R Axis:   12 Text Interpretation: Atrial fibrillation Incomplete right bundle branch block Prolonged QT Abnormal ECG When compared with ECG of 25-Aug-2020 23:05, PREVIOUS ECG IS PRESENT Confirmed by Fredia Sorrow [phone]) on 06/24/2021 7:30:51 PM  Radiology No results found.  Procedures Procedures   Medications Ordered in ED Medications - No data to display  ED Course  I have reviewed the triage vital signs and the nursing notes.  Pertinent labs & imaging results that were available during my care of the patient were reviewed by me and considered in my medical decision making (see chart for details).  Patient seen and examined. Plan discussed with patient.   Labs: Work-up ordered in triage, reviewed.  Hemoglobin is lower than the patient's baseline, down to 8.6.  Patient has extended ED wait time and will recheck H&H.  INR is 3.9.  Plan to hold Coumadin.  Creatinine is slightly elevated from baseline.  Imaging: No abdominal pain, defer to GI  Medications/Fluids: Currently hemodynamically stable, no medications ordered for emergent reversal of INR.  Vital signs reviewed and are as follows: BP 139/67 (BP Location: Left Arm)    Pulse (!) 56    Temp 97.9 F (36.6 C) (Oral)    Resp 12    SpO2 99%   Initial impression: lower GI bleed   I spoke with Dr. Therisa Doyne of [facility]. They will consult in AM. Asked that I speak with cardiology regarding anticoagulation 2/2 mechanical heart valve -- if he needed reversal what would they have [place] do.  Also asked that we obtain CT angio for GI bleeding, ordered.  I did talk with Dr. Audie Box with cardiology. Asked to hold coumadin. If bleeding worsens, will need reversal per standard protocol. They will be available if needed.   8:46 PM repeat H&H is stable 8.6 >> 9.1. Will call hospitalist for admission.   9:05 PM Spoke with Dr. Myna Hidalgo who  will consult for admission.     MDM Rules/Calculators/A&P                          Admit for monitoring.     Final Clinical Impression(s) / ED Diagnoses Final diagnoses:  Lower GI bleed  Anemia, unspecified type    Rx / DC Orders ED Discharge Orders     None        Carlisle Cater, PA-C 06/24/21 2106    Fredia Sorrow, MD 07/09/21 2329

## 2021-06-24 NOTE — ED Provider Notes (Signed)
Emergency Medicine Provider Triage Evaluation Note  Richard Spears , a 87 y.o. male  was evaluated in triage.  Pt complains of blood in stool.   Hx of bleeding ulcer in the past 15 years ago.  Currently on coumadin d/t valve replacement/afib.  States he his last two Bms have had bright red blood in them. States stool is black currently because he is on iron.   States he felt somewhat weak and LH today but no syncope or near syncope. No CP/SOB. No fever. No abd pain.   Review of Systems  Positive: Blood in stool Negative: Fever  Physical Exam  BP 109/60 (BP Location: Left Arm)    Pulse 66    Temp 97.9 F (36.6 C) (Oral)    Resp 18    SpO2 100%  Gen:   Awake, no distress   Resp:  Normal effort  MSK:   Moves extremities without difficulty  Other:  Abd soft NTTP.   Medical Decision Making  Medically screening exam initiated at 12:09 PM.  Appropriate orders placed.  Richard Spears was informed that the remainder of the evaluation will be completed by another provider, this initial triage assessment does not replace that evaluation, and the importance of remaining in the ED until their evaluation is complete.  Orders placed.     Tedd Sias, [place] 27/06/23 [id number]    Lianne Cure, DO 31/51/76 2124

## 2021-06-24 NOTE — H&P (Signed)
History and Physical    Richard Spears ZYS:[id number] DOB: Aug 01, 1925 DOA: 06/24/2021  PCP: [name], MD   Patient coming from: [facility]   Chief Complaint: Blood in stool   HPI: Richard Spears is a pleasant 87 y.o. male with medical history significant for atrial fibrillation and mechanical valve replacement on warfarin, hypertension, renal insufficiency, HFpEF and anemia, presenting to the emergency department for evaluation of blood in his stools.  Patient reports that he had a sudden urge to move his bowels yesterday morning but just passed dark and red liquid.  He had a bowel movement today that was formed and saw some red blood with that.  He has not had any abdominal pain or vomiting.  He reports history of GI bleeding roughly 20 years ago and has not taken any aspirin or NSAIDs since then.  ED Course: Upon arrival to the ED, patient is found to be afebrile, saturating well on room air, and with blood pressure 109/60.  Chemistry panel notable for sodium 132, BUN 37, and creatinine 1.57.  CBC features a WBC 3700 with ALC 597, and normocytic anemia with hemoglobin 8.6.  INR is 3.9.  Fecal occult blood testing is positive.  COVID and influenza PCR negative.  GI was consulted by the ED physician also discussed anticoagulation with cardiology, and hospitalist were asked to admit.  Review of Systems:  All other systems reviewed and apart from HPI, are negative.  Past Medical History:  Diagnosis Date   Anemia    leakoppenia   BPH (benign prostatic hypertrophy)    Bullous pemphigoid    [name], March 2011, right forearm squamous cell carcinoma   Chronic anticoagulation    systemic   Colon polyp    transverse, 2002   History of peptic ulcer    remote, 3/95   Hx of actinic keratosis    Hx of basal cell carcinoma    Hx of squamous cell carcinoma of skin    Hyperlipidemia    Left inguinal hernia    Moderate aortic insufficiency 2009   audible aortic insufficiency on 1/09  echo   PAF (paroxysmal atrial fibrillation) ([place]) 01/17/2014   On Warfarin.   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 Jude mechanical valve   ORIF FEMUR FRACTURE Left 08/28/2020   Procedure: OPEN REDUCTION INTERNAL FIXATION (ORIF) DISTAL FEMUR FRACTURE;  Surgeon: Rod Can, MD;  Location: [facility];  Service: Orthopedics;  Laterality: Left;   TOTAL HIP ARTHROPLASTY Right 10/12/2017   Procedure: RIGHT TOTAL HIP ARTHROPLASTY ANTERIOR APPROACH;  Surgeon: Gaynelle Arabian, MD;  Location: [facility];  Service: Orthopedics;  Laterality: Right;   TRANSTHORACIC ECHOCARDIOGRAM  12/2018   Unable to assess diastolic function because of A. fib. Normal RV function, but moderately elevated RVSP.  Severe biatrial enlargement. S/P St Jude bileaflet mechanical MVR that appears to be functioning normally. Mitral valve regurgitation cannot assess due to mechanical valve shadowing. MV Mean grad: 7.0 mmHg MV Area (PHT): 3.38 cm (stable for valve).  Mild Ao Sclerosis, Mild-Mod AI   TRANSTHORACIC ECHOCARDIOGRAM  08/'17; 10/'18    a) Mild conc LVH. EF 55-60%. No RWMA. Mod AI. Mechanical MV prosthesis functioning properly. LAD dilation.;; b)  EF 55-60%.  Mo AI.  Bileaflet Saint Jude mechanical MV with no paravalvular leak.  Severe LA dilation.  Minimally elevated PAP    Social History:   reports that he quit smoking about 63 years ago. His smoking use included cigarettes. He started smoking about 76 years ago. He has a 13.00 pack-year smoking history. He has never used smokeless tobacco. He reports current alcohol use. He reports that he does  not use drugs.  Allergies  Allergen Reactions   Flexeril [Cyclobenzaprine] Diarrhea    Family History  Problem Relation Age of Onset   Hypertension Mother    Lung cancer Sister    COPD Brother    Cancer Brother    Other Sister        polio   Lupus Son      Prior to Admission medications   Medication Sig Start Date End Date Taking? Authorizing Provider  acetaminophen (TYLENOL) 500 MG tablet Take 1,000 mg by mouth 3 (three) times daily as needed.    [provider]  bisoprolol (ZEBETA) 5 MG tablet Take 1 tablet (5 mg total) by mouth daily. 05/19/21   Leonie Man, MD  fluticasone [facility]) 50 MCG/ACT nasal spray Place 1 spray into both nostrils daily as needed for allergies or rhinitis.    [provider]  furosemide (LASIX) 40 MG tablet Take 40 mg tablet by mouth daily  except on Mondays ,Wednesdays and Fridays take an additional 20 mg ( 1/2 tablet)  by mouth which will be total of 60 mg. 05/19/21   Leonie Man, MD  hydrALAZINE (APRESOLINE) 25 MG tablet Take 25 mg by mouth daily at lunch, may take an  extra tablet as needed for systolic BP greater than 732 mmHg daily 06/17/21   Leonie Man, MD  isosorbide mononitrate (IMDUR) 30 MG 24 hr tablet Take 15 mg by mouth daily.    [provider]  lactose free nutrition (BOOST) LIQD Take 237 mLs by mouth 2 (two) times daily between meals.    [provider]  LORazepam (ATIVAN) 1 MG tablet Take 1 tablet (1 mg total) by mouth daily as needed for anxiety. 02/24/21   [place], Amy E, NP  Multiple Vitamins-Minerals (MULTIVITAMIN PO) Take 1 tablet by mouth daily.    [provider]  Multiple Vitamins-Minerals (PRESERVISION AREDS 2) CAPS Take 1 capsule by mouth daily. 04/07/21   [place], Peter M, MD  polyethylene glycol (MIRALAX / GLYCOLAX) 17 g packet Take 17 g by mouth daily as needed.    [provider]  tamsulosin (FLOMAX) 0.4 MG CAPS capsule Take 0.8 mg by mouth at bedtime.     [provider]  warfarin (COUMADIN) 5 MG tablet TAKE ONE TO ONE AND ONE-HALF TABLETS DAILY AS DIRECTED 04/15/21   Leonie Man, MD    Physical Exam: Vitals:   06/24/21 1930 06/24/21 1945 06/24/21 2000 06/24/21 2052  BP: (!) 146/86 (!) 144/57 137/60 (!) 144/83  Pulse: 73 77 (!) 103 (!) 101  Resp: 13 19  15   Temp:      TempSrc:      SpO2: 98% 97% 99% 100%     Constitutional: NAD, calm  Eyes: PERTLA, lids and conjunctivae normal ENMT: Mucous membranes are moist. Posterior pharynx clear of any exudate or lesions.   Neck: supple, no masses  Respiratory: no  wheezing, no crackles. No accessory muscle use.  Cardiovascular: Rate ~60 and irregular. Mild b/l lower leg swelling. Abdomen: No distension, no tenderness, soft. Bowel sounds active.  Musculoskeletal: no clubbing / cyanosis. No joint deformity upper and lower extremities.   Skin: Hyperpigmented LEs. Warm, dry, well-perfused. Neurologic: CN 2-12 grossly intact. Moving all extremities.  Psychiatric: Pleasant. Cooperative.    Labs and Imaging on Admission: I have personally reviewed following labs and imaging studies  CBC: Recent Labs  Lab 06/24/21 1220 06/24/21 2026  WBC 3.7*  --   NEUTROABS 2.5  --   HGB 8.6* 9.0*  HCT 26.7* 28.8*  MCV 98.9  --   PLT 164  --    Basic Metabolic Panel: Recent Labs  Lab 06/24/21 1220  NA 132*  K 4.1  CL 102  CO2 25  GLUCOSE 122*  BUN 31*  CREATININE 1.57*  CALCIUM 8.4*   GFR: CrCl cannot be calculated (Unknown ideal weight.). Liver Function Tests: Recent Labs  Lab 06/24/21 1220  AST 28  ALT 17  ALKPHOS 44  BILITOT 0.6  PROT 6.1*  ALBUMIN 2.8*   Recent Labs  Lab 06/24/21 1220  LIPASE 34   No results for input(s): AMMONIA in the last 168 hours. Coagulation Profile: Recent Labs  Lab 06/24/21 1220  INR 3.9*   Cardiac Enzymes: No results for input(s): CKTOTAL, CKMB, CKMBINDEX, TROPONINI in the last 168 hours. BNP (last 3 results) No results for  input(s): PROBNP in the last 8760 hours. HbA1C: No results for input(s): HGBA1C in the last 72 hours. CBG: No results for input(s): GLUCAP in the last 168 hours. Lipid Profile: No results for input(s): CHOL, HDL, LDLCALC, TRIG, CHOLHDL, LDLDIRECT in the last 72 hours. Thyroid Function Tests: No results for input(s): TSH, T4TOTAL, FREET4, T3FREE, THYROIDAB in the last 72 hours. Anemia Panel: No results for input(s): VITAMINB12, FOLATE, FERRITIN, TIBC, IRON, RETICCTPCT in the last 72 hours. Urine analysis: No results found for: COLORURINE, APPEARANCEUR, LABSPEC, PHURINE, GLUCOSEU, HGBUR, BILIRUBINUR, KETONESUR, PROTEINUR, UROBILINOGEN, NITRITE, LEUKOCYTESUR Sepsis Labs: @LABRCNTIP (procalcitonin:4,lacticidven:4) ) Recent Results (from the past 240 hour(s))  Resp Panel by RT-PCR (Flu A&B, Covid) Nasopharyngeal Swab     Status: None   Collection Time: 06/24/21  6:25 PM   Specimen: Nasopharyngeal Swab; Nasopharyngeal(NP) swabs in vial transport medium  Result Value Ref Range Status   SARS Coronavirus 2 by RT PCR NEGATIVE NEGATIVE Final    Comment: (NOTE) SARS-CoV-2 target nucleic acids are NOT DETECTED.  The SARS-CoV-2 RNA is generally detectable in upper respiratory specimens during the acute phase of infection. The lowest concentration of SARS-CoV-2 viral copies this assay can detect is 138 copies/mL. A negative result does not preclude SARS-Cov-2 infection and should not be used as the sole basis for treatment or other patient management decisions. A negative result may occur with  improper specimen collection/handling, submission of specimen other than nasopharyngeal swab, presence of viral mutation(s) within the areas targeted by this assay, and inadequate number of viral copies(<138 copies/mL). A negative result must be combined with clinical observations, patient history, and epidemiological information. The expected result is Negative.  Fact Sheet for Patients:   EntrepreneurPulse.com.au  Fact Sheet for Healthcare Providers:  IncredibleEmployment.be  This test is no t yet approved or cleared by the [place] FDA and  has been authorized for detection and/or diagnosis of SARS-CoV-2 by FDA under an Emergency Use Authorization (EUA). This EUA will remain  in effect (meaning this test can be used) for the duration of the  COVID-19 declaration under Section 564(b)(1) of the Act, 21 U.S.C.section 360bbb-3(b)(1), unless the authorization is terminated  or revoked sooner.       Influenza A by PCR NEGATIVE NEGATIVE Final   Influenza B by PCR NEGATIVE NEGATIVE Final    Comment: (NOTE) The Xpert Xpress SARS-CoV-2/FLU/RSV plus assay is intended as an aid in the diagnosis of influenza from Nasopharyngeal swab specimens and should not be used as a sole basis for treatment. Nasal washings and aspirates are unacceptable for Xpert Xpress SARS-CoV-2/FLU/RSV testing.  Fact Sheet for Patients: EntrepreneurPulse.com.au  Fact Sheet for Healthcare Providers: IncredibleEmployment.be  This test is not yet approved or cleared by the [place] FDA and has been authorized for detection and/or diagnosis of SARS-CoV-2 by FDA under an Emergency Use Authorization (EUA). This EUA will remain in effect (meaning this test can be used) for the duration of the COVID-19 declaration under Section 564(b)(1) of the Act, 21 U.S.C. section 360bbb-3(b)(1), unless the authorization is terminated or revoked.  Performed at [facility], [place] [address], [place], [place] [address]      Radiological Exams on Admission: No results found.  Assessment/Plan   1. GI bleeding; anemia  - Pt with MVR on warfarin presents with painless hematochezia x2 since 06/23/21, noted to have maroon stool on DRE, initial Hgb 8.6 (10.0 in August) and stable 8 hrs later in ED  - No abdominal pain, no overt  bleeding in ED where he is hemodynamically stable  - GI consulting and much appreciated  - Check CT angio, hold anticoagulation, follow serial H&H, keep NPO for now, and reverse warfarin if needed    2. S/p MVR; atrial fibrillation  - INR is 3.9 in ED  - Cardiology recommended holding anticoagulation for now and reversing warfarin in standard fashion if needed    3. Hypertension  - BP at goal in ED  - In setting of GI bleeding, will treat as needed only for now    4. HFpEF  - Appears compensated   - Hold Lasix while NPO and bleeding, monitor volume status   5. Renal insufficiency  - SCr is 1.57 on admission, up from 1.2 in April and 0.9 in March 2022  - Baseline unclear, likely CKD II or IIIa  - Hold Lasix for now while NPO and bleeding, renally-dose medications, monitor   6. Hyponatremia  - Appears chronic, stable   - Hold Lasix while NPO and bleeding   7. Lymphocytopenia  - Appears chronic and stable    DVT prophylaxis: SCDs for now  Code Status: DNR  Level of Care: Level of care: Telemetry Medical Family Communication: None present   Disposition Plan:  Patient is from: ILF  Anticipated d/c is to: TBD Anticipated d/c date is: Possibly as early as 06/26/21 Patient currently: Pending CT, serial H&H, GI consultation, stable H&H back on anticoagulation  Consults called: GI  Admission status: Inpatient     Vianne Bulls, MD [facility]  06/24/2021, 9:18 PM

## 2021-06-24 NOTE — ED Triage Notes (Addendum)
TO ED via [facility] for eval of blood in stool this am. [facility] from [facility]. Alert and oriented. Pt states he went to bathroom yesterday morning and toilet was completely black. Approx an hr prior to arrival pt states he had a stool that was solid, dark, but toilet was red. No pain in abd. Pt is on coumadin

## 2021-06-25 DIAGNOSIS — K922 Gastrointestinal hemorrhage, unspecified: Secondary | ICD-10-CM | POA: Diagnosis not present

## 2021-06-25 DIAGNOSIS — D649 Anemia, unspecified: Secondary | ICD-10-CM | POA: Diagnosis not present

## 2021-06-25 DIAGNOSIS — I4811 Longstanding persistent atrial fibrillation: Secondary | ICD-10-CM | POA: Diagnosis not present

## 2021-06-25 DIAGNOSIS — N1831 Chronic kidney disease, stage 3a: Secondary | ICD-10-CM | POA: Diagnosis not present

## 2021-06-25 LAB — BASIC METABOLIC PANEL
Anion gap: 5 (ref 5–15)
BUN: 28 mg/dL — ABNORMAL HIGH (ref 8–23)
CO2: 26 mmol/L (ref 22–32)
Calcium: 8.4 mg/dL — ABNORMAL LOW (ref 8.9–10.3)
Chloride: 104 mmol/L (ref 98–111)
Creatinine, Ser: 1.52 mg/dL — ABNORMAL HIGH (ref 0.61–1.24)
GFR, Estimated: 42 mL/min — ABNORMAL LOW (ref >60)
Glucose, Bld: 78 mg/dL (ref 70–99)
Potassium: 4.3 mmol/L (ref 3.5–5.1)
Sodium: 135 mmol/L (ref 135–145)

## 2021-06-25 LAB — HEMATOCRIT
HCT: 23.8 % — ABNORMAL LOW (ref 39.0–52.0)
HCT: 24.7 % — ABNORMAL LOW (ref 39.0–52.0)
HCT: 25.6 % — ABNORMAL LOW (ref 39.0–52.0)

## 2021-06-25 LAB — PROTIME-INR
INR: 3.9 — ABNORMAL HIGH (ref 0.8–1.2)
Prothrombin Time: 38.5 seconds — ABNORMAL HIGH (ref 11.4–15.2)

## 2021-06-25 LAB — HEMOGLOBIN
Hemoglobin: 7.9 g/dL — ABNORMAL LOW (ref 13.0–17.0)
Hemoglobin: 8 g/dL — ABNORMAL LOW (ref 13.0–17.0)
Hemoglobin: 8.3 g/dL — ABNORMAL LOW (ref 13.0–17.0)

## 2021-06-25 LAB — VITAMIN B12: Vitamin B-12: 587 pg/mL (ref 180–914)

## 2021-06-25 MED ORDER — VITAMIN K1 10 MG/ML IJ SOLN
5.0000 mg | Freq: Once | INTRAVENOUS | Status: AC
  Administered 2021-06-25: 15:00:00 5 mg via INTRAVENOUS
  Filled 2021-06-25: qty 0.5

## 2021-06-25 MED ORDER — PEG 3350-KCL-NA BICARB-NACL 420 G PO SOLR
4000.0000 mL | Freq: Once | ORAL | Status: AC
  Administered 2021-06-25: 11:00:00 4000 mL via ORAL
  Filled 2021-06-25: qty 4000

## 2021-06-25 NOTE — Progress Notes (Addendum)
PROGRESS NOTE    Richard Spears  WLS:[id number] DOB: 1925/10/18 DOA: 06/24/2021 PCP: [name], MD  Brief Narrative: [name] from independent living with spouse with history of paroxysmal atrial fibrillation and mechanical mitral valve replacement with [employer] metallic valve on Coumadin, chronic anemia, hypertension, chronic diastolic CHF, presented to the ED with 2 episodes of hematochezia, without abdominal pain fevers or chills -In the ED hemoglobin was down to 8.6, INR was 3.9 -CT angio abdomen-noted enhancing masslike filling defect within the mid transverse colon is identified. Although conceivably this could represent a large blood clot the diagnosis of exclusion is a primary colonic neoplasm.    Assessment & Plan:   Lower GI bleed Acute blood loss anemia -CT raises concern for large blood clot versus primary colon malignancy -On Coumadin for mechanical mitral valve and A. fib, INR 3.9 -Gastroenterology consulted, will need INR less than 2 for colonoscopy -Vitamin K today, Coumadin on hold, will start IV heparin when INR less than 2 -case was discussed with Cardiology yesterday who agreed with holding anticoagulation temporarily -If colonoscopy is concerning for mass will request cardiology consult as ongoing anticoagulation would be problematic -Start clears -Monitor hemoglobin closely, transfuse if drops less than 8 -Check anemia panel  S/P mechanical MVR Paroxysmal atrial fibrillation -INR 3.9, see discussion above regarding anticoagulation  Hypertension -BP stable, monitor without antihypertensives at this time  Chronic diastolic CHF -Clinically appears euvolemic, Lasix on hold  AKI on CKD 2 -Hold Lasix, monitor -Baseline creatinine around 1.2  Chronic venous stasis with ulcers and scabs  Leukopenia -Chronic, monitor  DVT prophylaxis: SCDs Code Status: DNR Family Communication: Discussed patient in detail, no family at bedside Disposition  Plan:  Status is: Inpatient  Remains inpatient appropriate because: Severity of illness    Consultants:  Gastroenterology  Procedures:   Antimicrobials:    Subjective: -Feels okay, no more bleeding episodes since yesterday morning  Objective: Vitals:   06/25/21 0530 06/25/21 0535 06/25/21 0635 06/25/21 0757  BP: (!) 151/65 (!) 152/61 (!) 144/92 (!) 148/83  Pulse: 89 (!) 46 93 90  Resp: 14 11 15 20   Temp:   (!) 97.5 F (36.4 C) 97.8 F (36.6 C)  TempSrc:   Axillary Oral  SpO2: 97% 96% 98% 96%  Weight:      Height:        Intake/Output Summary (Last 24 hours) at 06/25/2021 1125 Last data filed at 06/25/2021 0848 Gross per 24 hour  Intake --  Output 750 ml  Net -750 ml   Filed Weights   06/24/21 2254  Weight: 68 kg    Examination:  General exam: Elderly pleasant male sitting up in bed, AAOx3, no distress Respiratory system: Clear to auscultation Cardiovascular system: S1 & S2 heard, RRR.  Metallic click noted Abd: nondistended, soft and nontender.Normal bowel sounds heard. Central nervous system: Alert and oriented. No focal neurological deficits. Extremities: No edema, chronic venous stasis changes and 2 scabs noted Skin: No rashes Psychiatry: Judgement and insight appear normal. Mood & affect appropriate.     Data Reviewed:   CBC: Recent Labs  Lab 06/24/21 1220 06/24/21 2026 06/25/21 0228 06/25/21 0523  WBC 3.7*  --   --   --   NEUTROABS 2.5  --   --   --   HGB 8.6* 9.0* 7.9* 8.0*  HCT 26.7* 28.8* 23.8* 24.7*  MCV 98.9  --   --   --   PLT 164  --   --   --  Basic Metabolic Panel: Recent Labs  Lab 06/24/21 1220 06/25/21 0523  NA 132* 135  K 4.1 4.3  CL 102 104  CO2 25 26  GLUCOSE 122* 78  BUN 31* 28*  CREATININE 1.57* 1.52*  CALCIUM 8.4* 8.4*   GFR: Estimated Creatinine Clearance: 29.2 mL/min (A) (by C-G formula based on SCr of 1.52 mg/dL (H)). Liver Function Tests: Recent Labs  Lab 06/24/21 1220  AST 28  ALT 17  ALKPHOS  44  BILITOT 0.6  PROT 6.1*  ALBUMIN 2.8*   Recent Labs  Lab 06/24/21 1220  LIPASE 34   No results for input(s): AMMONIA in the last 168 hours. Coagulation Profile: Recent Labs  Lab 06/24/21 1220 06/25/21 0523  INR 3.9* 3.9*   Cardiac Enzymes: No results for input(s): CKTOTAL, CKMB, CKMBINDEX, TROPONINI in the last 168 hours. BNP (last 3 results) No results for input(s): PROBNP in the last 8760 hours. HbA1C: No results for input(s): HGBA1C in the last 72 hours. CBG: No results for input(s): GLUCAP in the last 168 hours. Lipid Profile: No results for input(s): CHOL, HDL, LDLCALC, TRIG, CHOLHDL, LDLDIRECT in the last 72 hours. Thyroid Function Tests: No results for input(s): TSH, T4TOTAL, FREET4, T3FREE, THYROIDAB in the last 72 hours. Anemia Panel: No results for input(s): VITAMINB12, FOLATE, FERRITIN, TIBC, IRON, RETICCTPCT in the last 72 hours. Urine analysis: No results found for: COLORURINE, APPEARANCEUR, LABSPEC, PHURINE, GLUCOSEU, HGBUR, BILIRUBINUR, KETONESUR, PROTEINUR, UROBILINOGEN, NITRITE, LEUKOCYTESUR Sepsis Labs: @LABRCNTIP (procalcitonin:4,lacticidven:4)  ) Recent Results (from the past 240 hour(s))  Resp Panel by RT-PCR (Flu A&B, Covid) Nasopharyngeal Swab     Status: None   Collection Time: 06/24/21  6:25 PM   Specimen: Nasopharyngeal Swab; Nasopharyngeal(NP) swabs in vial transport medium  Result Value Ref Range Status   SARS Coronavirus 2 by RT PCR NEGATIVE NEGATIVE Final    Comment: (NOTE) SARS-CoV-2 target nucleic acids are NOT DETECTED.  The SARS-CoV-2 RNA is generally detectable in upper respiratory specimens during the acute phase of infection. The lowest concentration of SARS-CoV-2 viral copies this assay can detect is 138 copies/mL. A negative result does not preclude SARS-Cov-2 infection and should not be used as the sole basis for treatment or other patient management decisions. A negative result may occur with  improper specimen  collection/handling, submission of specimen other than nasopharyngeal swab, presence of viral mutation(s) within the areas targeted by this assay, and inadequate number of viral copies(<138 copies/mL). A negative result must be combined with clinical observations, patient history, and epidemiological information. The expected result is Negative.  Fact Sheet for Patients:  EntrepreneurPulse.com.au  Fact Sheet for Healthcare Providers:  IncredibleEmployment.be  This test is no t yet approved or cleared by the [place] FDA and  has been authorized for detection and/or diagnosis of SARS-CoV-2 by FDA under an Emergency Use Authorization (EUA). This EUA will remain  in effect (meaning this test can be used) for the duration of the COVID-19 declaration under Section 564(b)(1) of the Act, 21 U.S.C.section 360bbb-3(b)(1), unless the authorization is terminated  or revoked sooner.       Influenza A by PCR NEGATIVE NEGATIVE Final   Influenza B by PCR NEGATIVE NEGATIVE Final    Comment: (NOTE) The Xpert Xpress SARS-CoV-2/FLU/RSV plus assay is intended as an aid in the diagnosis of influenza from Nasopharyngeal swab specimens and should not be used as a sole basis for treatment. Nasal washings and aspirates are unacceptable for Xpert Xpress SARS-CoV-2/FLU/RSV testing.  Fact Sheet for Patients: EntrepreneurPulse.com.au  Fact Sheet  for Healthcare Providers: IncredibleEmployment.be  This test is not yet approved or cleared by the [place] and has been authorized for detection and/or diagnosis of SARS-CoV-2 by FDA under an Emergency Use Authorization (EUA). This EUA will remain in effect (meaning this test can be used) for the duration of the COVID-19 declaration under Section 564(b)(1) of the Act, 21 U.S.C. section 360bbb-3(b)(1), unless the authorization is terminated or revoked.  Performed at [facility], [place] [address] [place], [place] [address]          Radiology Studies: CT ANGIO GI BLEED  Result Date: 06/24/2021 CLINICAL DATA:  Evaluate for lower GI tract bleed. EXAM: CTA ABDOMEN AND PELVIS WITHOUT AND WITH CONTRAST TECHNIQUE: Multidetector CT imaging of the abdomen and pelvis was performed using the standard protocol during bolus administration of intravenous contrast. Multiplanar reconstructed images and MIPs were obtained and reviewed to evaluate the vascular anatomy. CONTRAST:  84mL OMNIPAQUE IOHEXOL 350 MG/ML SOLN COMPARISON:  12/15/20 FINDINGS: VASCULAR Aorta: Normal caliber aorta without aneurysm, dissection, vasculitis or significant stenosis. Aortic atherosclerotic calcifications. None Celiac: Calcified plaque with approximately 50% stenosis at the origin of the celiac artery noted. SMA: Calcified plaque with approximately 60% stenosis at the origin of the SMA. Renals: Calcified plaque at the origin of both renal arteries results in greater than 50% stenosis bilaterally. IMA: Appears patent with greater than 50% stenosis at the origin. Inflow: Patent without evidence of aneurysm, dissection, vasculitis or significant stenosis. Proximal Outflow: Bilateral common femoral and visualized portions of the superficial and profunda femoral arteries are patent. There is non flow limiting dissection involving the right external iliac artery, image 601 through image 663/11. Veins: No obvious venous abnormality within the limitations of this arterial phase study. Review of the MIP images confirms the above findings. NON-VASCULAR Lower chest: Cardiac enlargement.  Lung bases are clear. Hepatobiliary: No acute abnormality. Low-density structure within segment 8 of the liver is technically too small to characterize measuring 7 mm, image 29/17. Gallbladder appears normal. No bile duct dilatation. Pancreas: Unremarkable. No pancreatic ductal dilatation or surrounding inflammatory changes.  Spleen: Normal in size without focal abnormality. Adrenals/Urinary Tract: Normal adrenal glands. No kidney stones or obstructive uropathy identified. Several subcentimeter low-density foci are identified within the right renal cortex which are technically too small to characterize measuring less than 1 cm. Urinary bladder is largely obscured by beam hardening artifact from patient's bilateral hip arthroplasty devices. Stomach/Bowel: Within the mid transverse colon there is a masslike intraluminal filling defect with avid arterial phase enhancement measuring 4.1 by 3.7 by 3.5 cm, image 42/13 and image 90/5. No additional abnormal areas of intraluminal contrast enhancement identified. No bowel wall thickening, inflammation or distension. Lymphatic: No enlarged lymph nodes. Reproductive: Prostate gland is largely obscured by streak artifact from bilateral hip arthroplasty devices. Other: Large left inguinal hernia contains a nonobstructed loop of large bowel. Musculoskeletal: Status post bilateral hip arthroplasty. Scoliosis and degenerative disc disease is identified. IMPRESSION: 1. Avid arterial phase enhancing masslike filling defect within the mid transverse colon is identified. Although conceivably this could represent a large blood clot the diagnosis of exclusion is a primary colonic neoplasm. Further evaluation with colonoscopy is recommended. 2. Extensive atherosclerotic disease noted with stenosis noted at the origin of the celiac artery, SMA, IMA and both renal arteries. 3. 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 4. Large left inguinal hernia contains a nonobstructed loop of large bowel. 5. Aortic Atherosclerosis (ICD10-I70.0). These results were called by  telephone at the time of interpretation on 06/24/2021 at 9:47 pm to provider [facility] , who verbally acknowledged these results. Electronically Signed   By: Kerby Moors M.D.   On: 06/24/2021 21:48     Scheduled  Meds:  sodium chloride flush  3 mL Intravenous Q12H   Continuous Infusions:   LOS: 1 day    Time spent: 56min  Domenic Polite, MD [facility]   06/25/2021, 11:25 AM

## 2021-06-25 NOTE — ED Notes (Signed)
Report given to Loanne Drilling, RN of [phone]

## 2021-06-25 NOTE — Consult Note (Signed)
[facility] Gastroenterology Consult  Referring Provider: ER Primary Care Physician:  [name], MD Primary Gastroenterologist: [facility]  Reason for Consultation: Rectal bleeding  HPI: Richard Spears is a 87 y.o. male who lives in an assisted living facility, presented to the ER with blood in stools.  Patient states that a month ago he was noted to have anemia and was started on oral iron 3 times a day by his primary care physician. A week ago he noticed some blood in stool which resolved on its own. Yesterday he had a bowel movement with walls black and he also noticed blood in stool. He discussed this with his primary care physician who advised him to come to the ED. His last bowel movement was yesterday. He denies any further rectal bleeding however states he noted blood on urination today morning. Patient's last colonoscopy was in 2008 which did not show diverticulosis but was noted to have hemorrhoids. Prior to that in 2002 he had a small transverse polyp removed. In 1995 he had a duodenal ulcer with visible vessel which was treated with epinephrine and BiCAP cautery.  Patient denies nausea, vomiting, acid reflux, heartburn, difficulty swallowing, early satiety, bloating. He fell and fractured his left femur in March and states he lost substantial amount of weight up until September, however has not had any further unintentional weight loss.  He is on Coumadin for mechanical mitral valve, last dose was taken yesterday morning. Patient felt weak and dizzy on standing up yesterday morning.   Past Medical History:  Diagnosis Date   Anemia    leakoppenia   BPH (benign prostatic hypertrophy)    Bullous pemphigoid    [name], March 2011, right forearm squamous cell carcinoma   Chronic anticoagulation    systemic   Colon polyp    transverse, 2002   History of peptic ulcer    remote, 3/95   Hx of actinic keratosis    Hx of basal cell carcinoma    Hx of squamous cell  carcinoma of skin    Hyperlipidemia    Left inguinal hernia    Moderate aortic insufficiency 2009   audible aortic insufficiency on 1/09 echo   PAF (paroxysmal atrial fibrillation) ([place]) 01/17/2014   On Warfarin.   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 Jude mechanical valve   ORIF FEMUR FRACTURE Left 08/28/2020   Procedure: OPEN REDUCTION INTERNAL FIXATION (ORIF) DISTAL FEMUR FRACTURE;  Surgeon: Rod Can, MD;  Location: [facility];  Service: Orthopedics;  Laterality: Left;   TOTAL HIP ARTHROPLASTY Right 10/12/2017   Procedure: RIGHT TOTAL HIP ARTHROPLASTY ANTERIOR APPROACH;  Surgeon: Gaynelle Arabian, MD;  Location: [facility];  Service: Orthopedics;  Laterality: Right;   TRANSTHORACIC ECHOCARDIOGRAM  12/2018   Unable to assess diastolic function because of A. fib. Normal RV function, but moderately elevated RVSP.  Severe biatrial enlargement. S/P St Jude bileaflet mechanical MVR that appears to be functioning normally. Mitral valve regurgitation cannot assess due to mechanical valve shadowing. MV Mean grad: 7.0 mmHg MV Area (PHT): 3.38 cm (stable  for valve).  Mild Ao Sclerosis, Mild-Mod AI   TRANSTHORACIC ECHOCARDIOGRAM  08/'17; 10/'18    a) Mild conc LVH. EF 55-60%. No RWMA. Mod AI. Mechanical MV prosthesis functioning properly. LAD dilation.;; b)  EF 55-60%.  Mo AI.  Bileaflet Saint Jude mechanical MV with no paravalvular leak.  Severe LA dilation.  Minimally elevated PAP    Prior to Admission medications   Medication Sig Start Date End Date Taking? Authorizing Provider  acetaminophen (TYLENOL) 500 MG tablet  Take 1,000 mg by mouth 3 (three) times daily as needed for mild pain.   Yes [provider]  bisoprolol (ZEBETA) 5 MG tablet Take 1 tablet (5 mg total) by mouth daily. 05/19/21  Yes Leonie Man, MD  Ferrous Sulfate (IRON PO) Take 1 tablet by mouth. Monday Wednesday Friday   Yes [provider]  fluticasone (FLONASE) 50 MCG/ACT nasal spray Place 1 spray into both nostrils daily as needed for allergies or rhinitis.   Yes [provider]  furosemide (LASIX) 40 MG tablet Take 40 mg tablet by mouth daily  except on Mondays ,Wednesdays and Fridays take an additional 20 mg ( 1/2 tablet)  by mouth which will be total of 60 mg. 05/19/21  Yes Leonie Man, MD  hydrALAZINE (APRESOLINE) 25 MG tablet Take 25 mg by mouth daily at lunch, may take an  extra tablet as needed for systolic BP greater than 182 mmHg daily 06/17/21  Yes Leonie Man, MD  isosorbide mononitrate (IMDUR) 30 MG 24 hr tablet Take 15 mg by mouth daily.   Yes [provider]  lactose free nutrition (BOOST) LIQD Take 237 mLs by mouth 2 (two) times daily between meals.   Yes [provider]  LORazepam (ATIVAN) 1 MG tablet Take 1 tablet (1 mg total) by mouth daily as needed for anxiety. 02/24/21  Yes [place], Amy E, NP  Multiple Vitamins-Minerals (MULTIVITAMIN PO) Take 1 tablet by mouth daily.   Yes [provider]  Multiple Vitamins-Minerals (PRESERVISION AREDS 2) CAPS Take 1 capsule by mouth daily. 04/07/21  Yes [place]ique, Peter M, MD  polyethylene glycol (MIRALAX / GLYCOLAX) 17 g packet Take 17 g by mouth daily as needed for mild constipation.   Yes [provider]  warfarin (COUMADIN) 5 MG tablet TAKE ONE TO ONE AND ONE-HALF TABLETS DAILY AS DIRECTED Patient taking differently: Take 5.5 mg by mouth daily. 04/15/21  Yes Leonie Man, MD  tamsulosin (FLOMAX) 0.4 MG CAPS capsule Take 0.8 mg by mouth at bedtime. Patient not taking: Reported on 06/24/2021    [provider]    Current Facility-Administered Medications  Medication Dose Route Frequency Provider Last Rate Last Admin   acetaminophen (TYLENOL) tablet 650 mg  650 mg Oral Q6H PRN Opyd, Ilene Qua, MD       Or   acetaminophen (TYLENOL) suppository 650 mg  650 mg Rectal Q6H PRN Opyd, Ilene Qua, MD       sodium chloride flush (NS) 0.9 % injection 3 mL  3 mL Intravenous Q12H Opyd, Ilene Qua, MD        Allergies as of 06/24/2021 - Review Complete 06/24/2021  Allergen Reaction Noted   Flexeril [cyclobenzaprine] Diarrhea 01/17/2014    Family History  Problem Relation Age of Onset   Hypertension Mother    Lung cancer Sister    COPD Brother    Cancer Brother    Other Sister        polio   Lupus Son  Social History   Socioeconomic History   Marital status: Married    Spouse name: Not on file   Number of children: 2   Years of education: Not on file   Highest education level: Not on file  Occupational History   Occupation: retired  Tobacco Use   Smoking status: Former    Packs/day: 1.00    Years: 13.00    Pack years: 13.00    Types: Cigarettes    Start date: 1947    Quit date: 1960    Years since quitting: 63.0   Smokeless tobacco: Never  Vaping Use   Vaping Use: Never used  Substance and Sexual Activity   Alcohol use: Yes    Alcohol/week: 0.0 standard drinks    Comment: 1-2 drinks per day   Drug use: Never   Sexual activity: Not on file  Other Topics Concern   Not on file  Social History Narrative   Patient lives at [facility], With his wife - Meryl Crutch.   Social Determinants of Health   Financial Resource Strain: Not on file  Food Insecurity: Not on file  Transportation Needs: Not on file  Physical Activity: Not on file  Stress: Not on file  Social Connections: Not on file  Intimate Partner Violence: Not on file    Review of Systems: Positive for: GI: Described in detail in HPI.    Gen: weakness, denies any fever, chills, rigors, night  sweats, anorexia, fatigue,  malaise, involuntary weight loss, and sleep disorder CV: Denies chest pain, angina, palpitations, syncope, orthopnea, PND, peripheral edema, and claudication. Resp: Denies dyspnea, cough, sputum, wheezing, coughing up blood. GU : 1 episode of blood-tinged urine MS: Left leg pain Derm: Denies rash, itching, oral ulcerations, hives, unhealing ulcers.  Psych: Denies depression, anxiety, memory loss, suicidal ideation, hallucinations,  and confusion. Heme: Denies bruising and enlarged lymph nodes. Neuro:  Denies any headaches, dizziness, paresthesias. Endo:  Denies any problems with DM, thyroid, adrenal function.  Physical Exam: Vital signs in last 24 hours: Temp:  [97.5 F (36.4 C)-98.5 F (36.9 C)] 97.5 F (36.4 C) (12/29 0635) Pulse Rate:  [46-119] 93 (12/29 0635) Resp:  [10-23] 15 (12/29 0635) BP: (109-152)/(57-92) 144/92 (12/29 0635) SpO2:  [93 %-100 %] 98 % (12/29 0635) Weight:  [68 kg] 68 kg (12/28 2254)    General:   Alert,  Well-developed, thinly built, pleasant and cooperative in NAD Head:  Normocephalic and atraumatic. Eyes:  Sclera clear, no icterus.  Mild pallor Ears:  Normal auditory acuity. Nose:  No deformity, discharge,  or lesions. Mouth:  No deformity or lesions.  Oropharynx pink & moist. Neck:  Supple; no masses or thyromegaly. Lungs:  Clear throughout to auscultation.   No wheezes, crackles, or rhonchi. No acute distress. Heart: Regular rhythm, click audible. Extremities: Chronic skin changes noted in bilateral lower extremities, no edema. Neurologic:  Alert and  oriented x4;  grossly normal neurologically. Skin:  Intact without significant lesions or rashes. Psych:  Alert and cooperative. Normal mood and affect. Abdomen:  Soft, nontender and nondistended. No masses, hepatosplenomegaly or hernias noted. Normal bowel sounds, without guarding, and without rebound.         Lab Results: Recent Labs    06/24/21 1220 06/24/21 2026  06/25/21 0228 06/25/21 0523  WBC 3.7*  --   --   --   HGB 8.6* 9.0* 7.9* 8.0*  HCT 26.7* 28.8* 23.8* 24.7*  PLT 164  --   --   --  BMET Recent Labs    06/24/21 1220 06/25/21 0523  NA 132* 135  K 4.1 4.3  CL 102 104  CO2 25 26  GLUCOSE 122* 78  BUN 31* 28*  CREATININE 1.57* 1.52*  CALCIUM 8.4* 8.4*   LFT Recent Labs    06/24/21 1220  PROT 6.1*  ALBUMIN 2.8*  AST 28  ALT 17  ALKPHOS 44  BILITOT 0.6   PT/INR Recent Labs    06/24/21 1220 06/25/21 0523  LABPROT 38.4* 38.5*  INR 3.9* 3.9*    Studies/Results: CT ANGIO GI BLEED  Result Date: 06/24/2021 CLINICAL DATA:  Evaluate for lower GI tract bleed. EXAM: CTA ABDOMEN AND PELVIS WITHOUT AND WITH CONTRAST TECHNIQUE: Multidetector CT imaging of the abdomen and pelvis was performed using the standard protocol during bolus administration of intravenous contrast. Multiplanar reconstructed images and MIPs were obtained and reviewed to evaluate the vascular anatomy. CONTRAST:  65mL OMNIPAQUE IOHEXOL 350 MG/ML SOLN COMPARISON:  12/15/20 FINDINGS: VASCULAR Aorta: Normal caliber aorta without aneurysm, dissection, vasculitis or significant stenosis. Aortic atherosclerotic calcifications. None Celiac: Calcified plaque with approximately 50% stenosis at the origin of the celiac artery noted. SMA: Calcified plaque with approximately 60% stenosis at the origin of the SMA. Renals: Calcified plaque at the origin of both renal arteries results in greater than 50% stenosis bilaterally. IMA: Appears patent with greater than 50% stenosis at the origin. Inflow: Patent without evidence of aneurysm, dissection, vasculitis or significant stenosis. Proximal Outflow: Bilateral common femoral and visualized portions of the superficial and profunda femoral arteries are patent. There is non flow limiting dissection involving the right external iliac artery, image 601 through image 663/11. Veins: No obvious venous abnormality within the limitations of  this arterial phase study. Review of the MIP images confirms the above findings. NON-VASCULAR Lower chest: Cardiac enlargement.  Lung bases are clear. Hepatobiliary: No acute abnormality. Low-density structure within segment 8 of the liver is technically too small to characterize measuring 7 mm, image 29/17. Gallbladder appears normal. No bile duct dilatation. Pancreas: Unremarkable. No pancreatic ductal dilatation or surrounding inflammatory changes. Spleen: Normal in size without focal abnormality. Adrenals/Urinary Tract: Normal adrenal glands. No kidney stones or obstructive uropathy identified. Several subcentimeter low-density foci are identified within the right renal cortex which are technically too small to characterize measuring less than 1 cm. Urinary bladder is largely obscured by beam hardening artifact from patient's bilateral hip arthroplasty devices. Stomach/Bowel: Within the mid transverse colon there is a masslike intraluminal filling defect with avid arterial phase enhancement measuring 4.1 by 3.7 by 3.5 cm, image 42/13 and image 90/5. No additional abnormal areas of intraluminal contrast enhancement identified. No bowel wall thickening, inflammation or distension. Lymphatic: No enlarged lymph nodes. Reproductive: Prostate gland is largely obscured by streak artifact from bilateral hip arthroplasty devices. Other: Large left inguinal hernia contains a nonobstructed loop of large bowel. Musculoskeletal: Status post bilateral hip arthroplasty. Scoliosis and degenerative disc disease is identified. IMPRESSION: 1. Avid arterial phase enhancing masslike filling defect within the mid transverse colon is identified. Although conceivably this could represent a large blood clot the diagnosis of exclusion is a primary colonic neoplasm. Further evaluation with colonoscopy is recommended. 2. Extensive atherosclerotic disease noted with stenosis noted at the origin of the celiac artery, SMA, IMA and both renal  arteries. 3. 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 4. Large left inguinal hernia contains a nonobstructed loop of large bowel. 5. Aortic Atherosclerosis (ICD10-I70.0). These results were called by telephone at the  time of interpretation on 06/24/2021 at 9:47 pm to provider [facility] , who verbally acknowledged these results. Electronically Signed   By: Kerby Moors M.D.   On: 06/24/2021 21:48    Impression: Blood in stool CT showed enhancing masslike filling defect within mid transverse colon?  Large blood clot versus primary colonic malignancy, direct visualization with colonoscopy recommended Hemoglobin 8.6 on presentation, subsequently 9, 7.9, 8  Mechanical mitral valve, on Coumadin, PT 38.5/INR 3.9  Large left inguinal hernia containing nonobstructed loop of large bowel  Plan: Patient will require colonoscopy, however recommend INR be less than 2 for the procedure to be performed safely. Recommend cardiology consult, hold Coumadin/warfarin, okay to start patient on IV heparin with plan to stop heparin 6 hours prior to colonoscopy. Will start patient on clear liquid diet. Will start colonic prep in anticipation of colonoscopy likely in the next 2 to 3 days, when INR is less than 2. Recommend H&H monitoring and transfusion to keep hemoglobin above 8 Discussed about the risks and the benefits of the procedure with the patient in details. He understands and verbalizes consent.   LOS: 1 day   Ronnette Juniper, MD  06/25/2021, 7:42 AM

## 2021-06-25 NOTE — Progress Notes (Signed)
Pt was admitted to room.  VS taken, tele set up, patient given CHG bath and changed.  Pt given urinal to use in bed.  Pt. Is NPO except for sips for meds and ice chips.  Pt PIV is on L FA and is patent.  Pt is A& O x 4 and given instruction on tv, call bell, telephone and light above bed.  Pt has no questions.

## 2021-06-25 NOTE — Plan of Care (Signed)

## 2021-06-25 NOTE — Consult Note (Signed)
WOC Nurse Consult Note: Patient receiving care in [facility] [phone] Reason for Consult: BLE venous ulcers Wound type: Anterior RLE ulcer on the shin that measures 2 cm x 1.5 cm x 0.1 cm with crusting  Anterior LLE ulcer that is 1 cm x 1 cm x 0.2 cm, red and moist.  Today I cleaned the BLE with soap and water, crusting came off of the RLE ulcer, rinsed and dried. Placed Aquacel over the two wounds and secured with foam dressing. Wrapped BLE with Kerlix and Coban. Next change due 06/27/21 Pressure Injury POA: NA Periwound: Hemosiderin staining BLE Dressing procedure/placement/frequency: Clean BLE with soap and water, rinse and pat dry. Place a cut to fit piece of Aquacel Advantage over the two anterior LE wounds and secure with foam dressing. Wrap both legs from just below the toes to just below the knees with Kerlix followed by Coban. Change every other day.   Monitor the wound area(s) for worsening of condition such as: Signs/symptoms of infection, increase in size, development of or worsening of odor, development of pain, or increased pain at the affected locations.   Notify the medical team if any of these develop.  Thank you for the consult. [place] nurse will not follow at this time.   Please re-consult the [place] team if needed.  Cathlean Marseilles Tamala Julian, MSN, RN, [place], Lysle Pearl, [facility] Wound Treatment Associate Pager [phone]

## 2021-06-26 DIAGNOSIS — K922 Gastrointestinal hemorrhage, unspecified: Secondary | ICD-10-CM | POA: Diagnosis not present

## 2021-06-26 DIAGNOSIS — D649 Anemia, unspecified: Secondary | ICD-10-CM | POA: Diagnosis not present

## 2021-06-26 LAB — PROTIME-INR
INR: 1.7 — ABNORMAL HIGH (ref 0.8–1.2)
Prothrombin Time: 19.8 seconds — ABNORMAL HIGH (ref 11.4–15.2)

## 2021-06-26 LAB — BASIC METABOLIC PANEL
Anion gap: 7 (ref 5–15)
BUN: 23 mg/dL (ref 8–23)
CO2: 22 mmol/L (ref 22–32)
Calcium: 8.1 mg/dL — ABNORMAL LOW (ref 8.9–10.3)
Chloride: 105 mmol/L (ref 98–111)
Creatinine, Ser: 1.37 mg/dL — ABNORMAL HIGH (ref 0.61–1.24)
GFR, Estimated: 48 mL/min — ABNORMAL LOW (ref >60)
Glucose, Bld: 89 mg/dL (ref 70–99)
Potassium: 4.4 mmol/L (ref 3.5–5.1)
Sodium: 134 mmol/L — ABNORMAL LOW (ref 135–145)

## 2021-06-26 LAB — CBC
HCT: 24.9 % — ABNORMAL LOW (ref 39.0–52.0)
Hemoglobin: 8.2 g/dL — ABNORMAL LOW (ref 13.0–17.0)
MCH: 31.9 pg (ref 26.0–34.0)
MCHC: 32.9 g/dL (ref 30.0–36.0)
MCV: 96.9 fL (ref 80.0–100.0)
Platelets: 156 10*3/uL (ref 150–400)
RBC: 2.57 MIL/uL — ABNORMAL LOW (ref 4.22–5.81)
RDW: 16.3 % — ABNORMAL HIGH (ref 11.5–15.5)
WBC: 2.6 10*3/uL — ABNORMAL LOW (ref 4.0–10.5)
nRBC: 0 % (ref 0.0–0.2)

## 2021-06-26 LAB — HEPARIN LEVEL (UNFRACTIONATED): Heparin Unfractionated: <0.1 IU/mL — ABNORMAL LOW (ref 0.30–0.70)

## 2021-06-26 MED ORDER — PEG 3350-KCL-NA BICARB-NACL 420 G PO SOLR: 4000.0000 mL | Freq: Once | ORAL | Status: DC

## 2021-06-26 MED ORDER — SODIUM CHLORIDE 0.9 % IV SOLN: INTRAVENOUS | Status: DC

## 2021-06-26 MED ORDER — LORAZEPAM 0.5 MG PO TABS
0.5000 mg | ORAL_TABLET | Freq: Every day | ORAL | Status: DC | PRN
  Administered 2021-06-26 – 2021-07-15 (×18): 0.5 mg via ORAL
  Filled 2021-06-26 (×21): qty 1

## 2021-06-26 MED ORDER — PEG 3350-KCL-NA BICARB-NACL 420 G PO SOLR
4000.0000 mL | Freq: Once | ORAL | Status: AC
  Administered 2021-06-26: 11:00:00 4000 mL via ORAL
  Filled 2021-06-26: qty 4000

## 2021-06-26 MED ORDER — HEPARIN (PORCINE) 25000 UT/250ML-% IV SOLN
1150.0000 [IU]/h | INTRAVENOUS | Status: AC
  Administered 2021-06-26: 11:00:00 950 [IU]/h via INTRAVENOUS
  Filled 2021-06-26: qty 250

## 2021-06-26 NOTE — Plan of Care (Signed)

## 2021-06-26 NOTE — Progress Notes (Signed)
ANTICOAGULATION CONSULT NOTE - Initial Consult  Pharmacy Consult for IV heparin  Indication: mechanical mitral valve  Allergies  Allergen Reactions   Flexeril [Cyclobenzaprine] Diarrhea    Patient Measurements: Height: 6' (182.9 cm) Weight: 68 kg (149 lb 14.6 oz) IBW/kg (Calculated) : 77.6 Heparin Dosing Weight: 68 kg   Vital Signs: Temp: 98.5 F (36.9 C) (12/30 0744) Temp Source: Oral (12/30 0744) BP: 156/86 (12/30 0744) Pulse Rate: 97 (12/30 0744)  Labs: Recent Labs    06/24/21 1220 06/24/21 2026 06/25/21 0523 06/25/21 1158 06/26/21 0053  HGB 8.6*   < > 8.0* 8.3* 8.2*  HCT 26.7*   < > 24.7* 25.6* 24.9*  PLT 164  --   --   --  156  LABPROT 38.4*  --  38.5*  --  19.8*  INR 3.9*  --  3.9*  --  1.7*  CREATININE 1.57*  --  1.52*  --  1.37*   < > = values in this interval not displayed.    Estimated Creatinine Clearance: 32.4 mL/min (A) (by C-G formula based on SCr of 1.37 mg/dL (H)).   Medical History: Past Medical History:  Diagnosis Date   Anemia    leakoppenia   BPH (benign prostatic hypertrophy)    Bullous pemphigoid    [name], March 2011, right forearm squamous cell carcinoma   Chronic anticoagulation    systemic   Colon polyp    transverse, 2002   History of peptic ulcer    remote, 3/95   Hx of actinic keratosis    Hx of basal cell carcinoma    Hx of squamous cell carcinoma of skin    Hyperlipidemia    Left inguinal hernia    Moderate aortic insufficiency 2009   audible aortic insufficiency on 1/09 echo   PAF (paroxysmal atrial fibrillation) ([place]) 01/17/2014   On Warfarin.   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 Last BM 12/29 @1746  large and red. PTA warfarin (5.5 mg daily, LKD 12/28 @0730 ). INR 1.7, subtherapeutic (goal  2.5-3.5). Pharmacy consulted to start IV heparin.   Goal of Therapy:  Heparin level 0.3-0.5 IU/mL Monitor platelets by anticoagulation protocol: Yes   Plan:  Heparin IV 950 units/hr (no bolus)  8 H heparin level  Daily heparin level and CBC while on IV heparin  Hold 6 hours prior to colonoscopy 12/31  Thank you for allowing pharmacy to participate in this patient's care.  Levonne Spiller, PharmD PGY1 Acute Care Resident  06/26/2021,8:44 AM

## 2021-06-26 NOTE — Progress Notes (Signed)
TR[facility] night cross cover note:  I was notified by RN  that patient's order for telemetry monitoring had expired, and asked for clarification regarding if patient needed ongoing telemetry monitoring.  Per my review of most recent rounding hospitalist progress note, patient is here with acute blood loss anemia, and appears to warrant additional monitoring on telemetry. I subsequently renewed order for telemetry monitoring.      Babs Bertin, DO Hospitalist

## 2021-06-26 NOTE — Progress Notes (Signed)
PROGRESS NOTE    Richard Spears  DGL:[id number] DOB: February 29, 1926 DOA: 06/24/2021 PCP: [name], MD  Brief Narrative: [name] from independent living with spouse with history of paroxysmal atrial fibrillation and mechanical mitral valve replacement with [employer]. Jude's metallic valve on Coumadin, chronic anemia, hypertension, chronic diastolic CHF, presented to the ED with 2 episodes of hematochezia, without abdominal pain fevers or chills -In the ED hemoglobin was down to 8.6, INR was 3.9 -CT angio abdomen-noted enhancing masslike filling defect within the mid transverse colon is identified. Although conceivably this could represent a large blood clot the diagnosis of exclusion is a primary colonic neoplasm.    Assessment & Plan:   Lower GI bleed Acute blood loss anemia -CTA raises concern for large blood clot versus primary colon malignancy -On Coumadin for mechanical mitral valve and A. fib, INR 3.9 on admission -Gastroenterology following, was given vitamin K yesterday, INR down to 1.7 -Plan for colonoscopy tomorrow, will start IV heparin per pharmacy, to be held for tomorrow's procedure at 2 AM -case was discussed with Cardiology on admission who agreed with holding anticoagulation temporarily -If colonoscopy is concerning for mass will request formal cardiology consult as ongoing anticoagulation would be problematic -Continue clears, hemoglobin is stable -Check anemia panel, transfuse if drops less than 8  S/P mechanical MVR Paroxysmal atrial fibrillation -INR 3.9, see discussion above regarding anticoagulation  Hypertension -BP stable, monitor without antihypertensives at this time  Chronic diastolic CHF -Clinically appears euvolemic, Lasix on hold  AKI on CKD 2 -Hold Lasix, monitor -Baseline creatinine around 1.2  Chronic venous stasis with ulcers and scabs  Leukopenia -Chronic, monitor  DVT prophylaxis: SCDs Code Status: DNR Family Communication:  Discussed patient in detail, no family at bedside Disposition Plan:  Status is: Inpatient  Remains inpatient appropriate because: Severity of illness    Consultants:  Gastroenterology  Procedures:   Antimicrobials:    Subjective: -Feels okay, started prep for colonoscopy yesterday, does not report any active hematochezia since  Objective: Vitals:   06/26/21 0009 06/26/21 0334 06/26/21 0744 06/26/21 1100  BP: 120/82 (!) 139/59 (!) 156/86 (!) 141/81  Pulse: 90 63 97 84  Resp: 19 19 16 18   Temp: 98.6 F (37 C) 98.7 F (37.1 C) 98.5 F (36.9 C) 98.3 F (36.8 C)  TempSrc: Oral Oral Oral Oral  SpO2: 96%  96% 95%  Weight:      Height:        Intake/Output Summary (Last 24 hours) at 06/26/2021 1159 Last data filed at 06/26/2021 0940 Gross per 24 hour  Intake 120 ml  Output 675 ml  Net -555 ml   Filed Weights   06/24/21 2254  Weight: 68 kg    Examination:  General exam: Pleasant elderly male sitting up in bed, AAOx3, no distress CVS: S1-S2, regular rate rhythm, metallic click to Lungs: Clear bilaterally Abdomen: Soft, nontender, bowel sounds present Extremities: No edema, chronic venous stasis changes and 2 scabs  Skin: No rashes Psychiatry: Judgement and insight appear normal. Mood & affect appropriate.     Data Reviewed:   CBC: Recent Labs  Lab 06/24/21 1220 06/24/21 2026 06/25/21 0228 06/25/21 0523 06/25/21 1158 06/26/21 0053  WBC 3.7*  --   --   --   --  2.6*  NEUTROABS 2.5  --   --   --   --   --   HGB 8.6* 9.0* 7.9* 8.0* 8.3* 8.2*  HCT 26.7* 28.8* 23.8* 24.7* 25.6* 24.9*  MCV 98.9  --   --   --   --  96.9  PLT 164  --   --   --   --  469   Basic Metabolic Panel: Recent Labs  Lab 06/24/21 1220 06/25/21 0523 06/26/21 0053  NA 132* 135 134*  K 4.1 4.3 4.4  CL 102 104 105  CO2 25 26 22   GLUCOSE 122* 78 89  BUN 31* 28* 23  CREATININE 1.57* 1.52* 1.37*  CALCIUM 8.4* 8.4* 8.1*   GFR: Estimated Creatinine Clearance: 32.4 mL/min (A) (by  C-G formula based on SCr of 1.37 mg/dL (H)). Liver Function Tests: Recent Labs  Lab 06/24/21 1220  AST 28  ALT 17  ALKPHOS 44  BILITOT 0.6  PROT 6.1*  ALBUMIN 2.8*   Recent Labs  Lab 06/24/21 1220  LIPASE 34   No results for input(s): AMMONIA in the last 168 hours. Coagulation Profile: Recent Labs  Lab 06/24/21 1220 06/25/21 0523 06/26/21 0053  INR 3.9* 3.9* 1.7*   Cardiac Enzymes: No results for input(s): CKTOTAL, CKMB, CKMBINDEX, TROPONINI in the last 168 hours. BNP (last 3 results) No results for input(s): PROBNP in the last 8760 hours. HbA1C: No results for input(s): HGBA1C in the last 72 hours. CBG: No results for input(s): GLUCAP in the last 168 hours. Lipid Profile: No results for input(s): CHOL, HDL, LDLCALC, TRIG, CHOLHDL, LDLDIRECT in the last 72 hours. Thyroid Function Tests: No results for input(s): TSH, T4TOTAL, FREET4, T3FREE, THYROIDAB in the last 72 hours. Anemia Panel: Recent Labs    06/25/21 1158  VITAMINB12 587   Urine analysis: No results found for: COLORURINE, APPEARANCEUR, LABSPEC, PHURINE, GLUCOSEU, HGBUR, BILIRUBINUR, KETONESUR, PROTEINUR, UROBILINOGEN, NITRITE, LEUKOCYTESUR Sepsis Labs: @LABRCNTIP (procalcitonin:4,lacticidven:4)  ) Recent Results (from the past 240 hour(s))  Resp Panel by RT-PCR (Flu A&B, Covid) Nasopharyngeal Swab     Status: None   Collection Time: 06/24/21  6:25 PM   Specimen: Nasopharyngeal Swab; Nasopharyngeal(NP) swabs in vial transport medium  Result Value Ref Range Status   SARS Coronavirus 2 by RT PCR NEGATIVE NEGATIVE Final    Comment: (NOTE) SARS-CoV-2 target nucleic acids are NOT DETECTED.  The SARS-CoV-2 RNA is generally detectable in upper respiratory specimens during the acute phase of infection. The lowest concentration of SARS-CoV-2 viral copies this assay can detect is 138 copies/mL. A negative result does not preclude SARS-Cov-2 infection and should not be used as the sole basis for treatment  or other patient management decisions. A negative result may occur with  improper specimen collection/handling, submission of specimen other than nasopharyngeal swab, presence of viral mutation(s) within the areas targeted by this assay, and inadequate number of viral copies(<138 copies/mL). A negative result must be combined with clinical observations, patient history, and epidemiological information. The expected result is Negative.  Fact Sheet for Patients:  EntrepreneurPulse.com.au  Fact Sheet for Healthcare Providers:  IncredibleEmployment.be  This test is no t yet approved or cleared by the [place] FDA and  has been authorized for detection and/or diagnosis of SARS-CoV-2 by FDA under an Emergency Use Authorization (EUA). This EUA will remain  in effect (meaning this test can be used) for the duration of the COVID-19 declaration under Section 564(b)(1) of the Act, 21 U.S.C.section 360bbb-3(b)(1), unless the authorization is terminated  or revoked sooner.       Influenza A by PCR NEGATIVE NEGATIVE Final   Influenza B by PCR NEGATIVE NEGATIVE Final    Comment: (NOTE) The Xpert Xpress SARS-CoV-2/FLU/RSV plus assay is intended as an aid in the diagnosis of influenza from Nasopharyngeal swab specimens and should  not be used as a sole basis for treatment. Nasal washings and aspirates are unacceptable for Xpert Xpress SARS-CoV-2/FLU/RSV testing.  Fact Sheet for Patients: EntrepreneurPulse.com.au  Fact Sheet for Healthcare Providers: IncredibleEmployment.be  This test is not yet approved or cleared by the [place] FDA and has been authorized for detection and/or diagnosis of SARS-CoV-2 by FDA under an Emergency Use Authorization (EUA). This EUA will remain in effect (meaning this test can be used) for the duration of the COVID-19 declaration under Section 564(b)(1) of the Act, 21 U.S.C. section  360bbb-3(b)(1), unless the authorization is terminated or revoked.  Performed at [facility], [place] [address], [place], [place] [address]     Radiology Studies: CT ANGIO GI BLEED  Result Date: 06/24/2021 CLINICAL DATA:  Evaluate for lower GI tract bleed. EXAM: CTA ABDOMEN AND PELVIS WITHOUT AND WITH CONTRAST TECHNIQUE: Multidetector CT imaging of the abdomen and pelvis was performed using the standard protocol during bolus administration of intravenous contrast. Multiplanar reconstructed images and MIPs were obtained and reviewed to evaluate the vascular anatomy. CONTRAST:  35mL OMNIPAQUE IOHEXOL 350 MG/ML SOLN COMPARISON:  12/15/20 FINDINGS: VASCULAR Aorta: Normal caliber aorta without aneurysm, dissection, vasculitis or significant stenosis. Aortic atherosclerotic calcifications. None Celiac: Calcified plaque with approximately 50% stenosis at the origin of the celiac artery noted. SMA: Calcified plaque with approximately 60% stenosis at the origin of the SMA. Renals: Calcified plaque at the origin of both renal arteries results in greater than 50% stenosis bilaterally. IMA: Appears patent with greater than 50% stenosis at the origin. Inflow: Patent without evidence of aneurysm, dissection, vasculitis or significant stenosis. Proximal Outflow: Bilateral common femoral and visualized portions of the superficial and profunda femoral arteries are patent. There is non flow limiting dissection involving the right external iliac artery, image 601 through image 663/11. Veins: No obvious venous abnormality within the limitations of this arterial phase study. Review of the MIP images confirms the above findings. NON-VASCULAR Lower chest: Cardiac enlargement.  Lung bases are clear. Hepatobiliary: No acute abnormality. Low-density structure within segment 8 of the liver is technically too small to characterize measuring 7 mm, image 29/17. Gallbladder appears normal. No bile duct dilatation. Pancreas:  Unremarkable. No pancreatic ductal dilatation or surrounding inflammatory changes. Spleen: Normal in size without focal abnormality. Adrenals/Urinary Tract: Normal adrenal glands. No kidney stones or obstructive uropathy identified. Several subcentimeter low-density foci are identified within the right renal cortex which are technically too small to characterize measuring less than 1 cm. Urinary bladder is largely obscured by beam hardening artifact from patient's bilateral hip arthroplasty devices. Stomach/Bowel: Within the mid transverse colon there is a masslike intraluminal filling defect with avid arterial phase enhancement measuring 4.1 by 3.7 by 3.5 cm, image 42/13 and image 90/5. No additional abnormal areas of intraluminal contrast enhancement identified. No bowel wall thickening, inflammation or distension. Lymphatic: No enlarged lymph nodes. Reproductive: Prostate gland is largely obscured by streak artifact from bilateral hip arthroplasty devices. Other: Large left inguinal hernia contains a nonobstructed loop of large bowel. Musculoskeletal: Status post bilateral hip arthroplasty. Scoliosis and degenerative disc disease is identified. IMPRESSION: 1. Avid arterial phase enhancing masslike filling defect within the mid transverse colon is identified. Although conceivably this could represent a large blood clot the diagnosis of exclusion is a primary colonic neoplasm. Further evaluation with colonoscopy is recommended. 2. Extensive atherosclerotic disease noted with stenosis noted at the origin of the celiac artery, SMA, IMA and both renal arteries. 3. 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 4. Large left inguinal hernia contains a nonobstructed loop of large bowel. 5. Aortic Atherosclerosis (ICD10-I70.0). These results were called by telephone at the time of interpretation on 06/24/2021 at 9:47 pm to provider [facility] , who verbally acknowledged these results.  Electronically Signed   By: Kerby Moors M.D.   On: 06/24/2021 21:48     Scheduled Meds:  sodium chloride flush  3 mL Intravenous Q12H   Continuous Infusions:  sodium chloride     heparin 950 Units/hr (06/26/21 1105)     LOS: 2 days    Time spent: 38min  Domenic Polite, MD [facility]   06/26/2021, 11:59 AM

## 2021-06-26 NOTE — Progress Notes (Signed)
[facility] night cross cover note:  I was notified by RN of patient's request for resumption of his home daily prn Ativan, which he takes for anxiety/insomnia.  I subsequently placed order to resume daily as needed Ativan, but at reduced dose of 0.5 mg as opposed to home dose of 1 mg.     Babs Bertin, DO Hospitalist

## 2021-06-26 NOTE — Progress Notes (Signed)
ANTICOAGULATION CONSULT NOTE  Pharmacy Consult for IV heparin  Indication: mechanical mitral valve  Allergies  Allergen Reactions   Flexeril [Cyclobenzaprine] Diarrhea    Patient Measurements: Height: 6' (182.9 cm) Weight: 68 kg (149 lb 14.6 oz) IBW/kg (Calculated) : 77.6 Heparin Dosing Weight: 68 kg   Vital Signs: Temp: 98.2 F (36.8 C) (12/30 1820) Temp Source: Oral (12/30 1820) BP: 146/89 (12/30 1820) Pulse Rate: 111 (12/30 1820)  Labs: Recent Labs    06/24/21 1220 06/24/21 2026 06/25/21 0523 06/25/21 1158 06/26/21 0053 06/26/21 1759  HGB 8.6*   < > 8.0* 8.3* 8.2*  --   HCT 26.7*   < > 24.7* 25.6* 24.9*  --   PLT 164  --   --   --  156  --   LABPROT 38.4*  --  38.5*  --  Richard Spears.8*  --   INR 3.9*  --  3.9*  --  1.7*  --   HEPARINUNFRC  --   --   --   --   --  <0.10*  CREATININE 1.57*  --  1.52*  --  1.37*  --    < > = values in this interval not displayed.     Estimated Creatinine Clearance: 32.4 mL/min (A) (by C-G formula based on SCr of 1.37 mg/dL (H)).   Medical History: Past Medical History:  Diagnosis Date   Anemia    leakoppenia   BPH (benign prostatic hypertrophy)    Bullous pemphigoid    Richard Spears Spears, March 2011, right forearm squamous cell carcinoma   Chronic anticoagulation    systemic   Colon polyp    transverse, 2002   History of peptic ulcer    remote, 3/95   Hx of actinic keratosis    Hx of basal cell carcinoma    Hx of squamous cell carcinoma of skin    Hyperlipidemia    Left inguinal hernia    Moderate aortic insufficiency 2009   audible aortic insufficiency on 1/09 echo   PAF (paroxysmal atrial fibrillation) (Richard Spears Spears) 01/17/2014   On Warfarin.   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 Last BM 12/29 @1746  large and red. PTA warfarin (5.5 mg daily, LKD 12/28 @0730 ). INR 1.7, subtherapeutic (goal 2.5-3.5). Pharmacy consulted to start IV heparin.   Initial heparin level is undetectable.  Goal of Therapy:  Heparin level 0.3-0.5 IU/mL Monitor platelets by anticoagulation protocol: Yes   Plan:  Increase heparin 1150 units/h no bolus Stop heparin 12/31 at 0200 pre-colonoscopy   Richard Spears Spears, PharmD, BCPS, [facility] Clinical Pharmacist [phone] Please check AMION for all [place] numbers 06/26/2021

## 2021-06-26 NOTE — Care Management Important Message (Signed)
Important Message  Patient Details  Name: DRURY ARDIZZONE MRN: [id number] Date of Birth: 04/15/1926   Medicare Important Message Given:  Yes     Rexann Lueras 06/26/2021, 2:16 PM

## 2021-06-26 NOTE — Progress Notes (Signed)
Subjective: Patient reports drinking some of the colonic prep yesterday, had 2 large bowel movements, nonbloody, denies abdominal pain.  Objective: Vital signs in last 24 hours: Temp:  [98.5 F (36.9 C)-98.7 F (37.1 C)] 98.5 F (36.9 C) (12/30 0744) Pulse Rate:  [63-97] 97 (12/30 0744) Resp:  [16-20] 16 (12/30 0744) BP: (120-156)/(59-86) 156/86 (12/30 0744) SpO2:  [91 %-99 %] 96 % (12/30 0744) Weight change:  Last BM Date: 06/25/21  PE: Elderly, not in distress, thinly built GENERAL: Mild pallor  ABDOMEN: Soft, nondistended, hyperactive bowel sounds EXTREMITIES: No deformity  Lab Results: Results for orders placed or performed during the hospital encounter of 06/24/21 (from the past 48 hour(s))  CBC with Differential/Platelet     Status: Abnormal   Collection Time: 06/24/21 12:20 PM  Result Value Ref Range   WBC 3.7 (L) 4.0 - 10.5 K/uL   RBC 2.70 (L) 4.22 - 5.81 MIL/uL   Hemoglobin 8.6 (L) 13.0 - 17.0 g/dL   HCT 26.7 (L) 39.0 - 52.0 %   MCV 98.9 80.0 - 100.0 fL   MCH 31.9 26.0 - 34.0 pg   MCHC 32.2 30.0 - 36.0 g/dL   RDW 16.5 (H) 11.5 - 15.5 %   Platelets 164 150 - 400 K/uL   nRBC 0.0 0.0 - 0.2 %   Neutrophils Relative % 68 %   Neutro Abs 2.5 1.7 - 7.7 K/uL   Lymphocytes Relative 16 %   Lymphs Abs 0.6 (L) 0.7 - 4.0 K/uL   Monocytes Relative 12 %   Monocytes Absolute 0.5 0.1 - 1.0 K/uL   Eosinophils Relative 2 %   Eosinophils Absolute 0.1 0.0 - 0.5 K/uL   Basophils Relative 1 %   Basophils Absolute 0.0 0.0 - 0.1 K/uL   Immature Granulocytes 1 %   Abs Immature Granulocytes 0.04 0.00 - 0.07 K/uL    Comment: Performed at [facility], [address], [place], [place] [address]  Comprehensive metabolic panel     Status: Abnormal   Collection Time: 06/24/21 12:20 PM  Result Value Ref Range   Sodium 132 (L) 135 - 145 mmol/L   Potassium 4.1 3.5 - 5.1 mmol/L   Chloride 102 98 - 111 mmol/L   CO2 25 22 - 32 mmol/L   Glucose, Bld 122 (H) 70 - 99 mg/dL    Comment:  Glucose reference range applies only to samples taken after fasting for at least 8 hours.   BUN 31 (H) 8 - 23 mg/dL   Creatinine, Ser 1.57 (H) 0.61 - 1.24 mg/dL   Calcium 8.4 (L) 8.9 - 10.3 mg/dL   Total Protein 6.1 (L) 6.5 - 8.1 g/dL   Albumin 2.8 (L) 3.5 - 5.0 g/dL   AST 28 15 - 41 U/L   ALT 17 0 - 44 U/L   Alkaline Phosphatase 44 38 - 126 U/L   Total Bilirubin 0.6 0.3 - 1.2 mg/dL   GFR, Estimated 41 (L) >60 mL/min    Comment: (NOTE) Calculated using the CKD-EPI Creatinine Equation (2021)    Anion gap 5 5 - 15    Comment: Performed at [facility], [place] [address], [place], [place] [address]  Lipase, blood     Status: None   Collection Time: 06/24/21 12:20 PM  Result Value Ref Range   Lipase 34 11 - 51 U/L    Comment: Performed at [facility] [address] [place], [place] [address]  Protime-INR     Status: Abnormal  Collection Time: 06/24/21 12:20 PM  Result Value Ref Range   Prothrombin Time 38.4 (H) 11.4 - 15.2 seconds   INR 3.9 (H) 0.8 - 1.2    Comment: (NOTE) INR goal varies based on device and disease states. Performed at [facility], [place] [address] [place], [place] [address]   Resp Panel by RT-PCR (Flu A&B, Covid) Nasopharyngeal Swab     Status: None   Collection Time: 06/24/21  6:25 PM   Specimen: Nasopharyngeal Swab; Nasopharyngeal(NP) swabs in vial transport medium  Result Value Ref Range   SARS Coronavirus 2 by RT PCR NEGATIVE NEGATIVE    Comment: (NOTE) SARS-CoV-2 target nucleic acids are NOT DETECTED.  The SARS-CoV-2 RNA is generally detectable in upper respiratory specimens during the acute phase of infection. The lowest concentration of SARS-CoV-2 viral copies this assay can detect is 138 copies/mL. A negative result does not preclude SARS-Cov-2 infection and should not be used as the sole basis for treatment or other patient management decisions. A negative result may occur with  improper specimen collection/handling, submission  of specimen other than nasopharyngeal swab, presence of viral mutation(s) within the areas targeted by this assay, and inadequate number of viral copies(<138 copies/mL). A negative result must be combined with clinical observations, patient history, and epidemiological information. The expected result is Negative.  Fact Sheet for Patients:  EntrepreneurPulse.com.au  Fact Sheet for Healthcare Providers:  IncredibleEmployment.be  This test is no t yet approved or cleared by the [place] FDA and  has been authorized for detection and/or diagnosis of SARS-CoV-2 by FDA under an Emergency Use Authorization (EUA). This EUA will remain  in effect (meaning this test can be used) for the duration of the COVID-19 declaration under Section 564(b)(1) of the Act, 21 U.S.C.section 360bbb-3(b)(1), unless the authorization is terminated  or revoked sooner.       Influenza A by PCR NEGATIVE NEGATIVE   Influenza B by PCR NEGATIVE NEGATIVE    Comment: (NOTE) The Xpert Xpress SARS-CoV-2/FLU/RSV plus assay is intended as an aid in the diagnosis of influenza from Nasopharyngeal swab specimens and should not be used as a sole basis for treatment. Nasal washings and aspirates are unacceptable for Xpert Xpress SARS-CoV-2/FLU/RSV testing.  Fact Sheet for Patients: EntrepreneurPulse.com.au  Fact Sheet for Healthcare Providers: IncredibleEmployment.be  This test is not yet approved or cleared by the [place] FDA and has been authorized for detection and/or diagnosis of SARS-CoV-2 by FDA under an Emergency Use Authorization (EUA). This EUA will remain in effect (meaning this test can be used) for the duration of the COVID-19 declaration under Section 564(b)(1) of the Act, 21 U.S.C. section 360bbb-3(b)(1), unless the authorization is terminated or revoked.  Performed at [facility], [place] [address] [place],  [place] [address]   POC occult blood, ED     Status: Abnormal   Collection Time: 06/24/21  6:35 PM  Result Value Ref Range   Fecal Occult Bld POSITIVE (A) NEGATIVE  Hemoglobin and hematocrit, blood     Status: Abnormal   Collection Time: 06/24/21  8:26 PM  Result Value Ref Range   Hemoglobin 9.0 (L) 13.0 - 17.0 g/dL   HCT 28.8 (L) 39.0 - 52.0 %    Comment: Performed at [facility] [address] [place], [place] [address]  Type and screen [place]     Status: None   Collection Time: 06/24/21 10:10 PM  Result Value Ref Range   ABO/RH(D) Jenetta Downer  POS    Antibody Screen NEG    Sample Expiration      06/27/2021,2359 Performed at [facility], [place] [address], [place], [place] [address]   Hemoglobin     Status: Abnormal   Collection Time: 06/25/21  2:28 AM  Result Value Ref Range   Hemoglobin 7.9 (L) 13.0 - 17.0 g/dL    Comment: Performed at [facility], [place] [address], [place], [place] [address]  Hematocrit     Status: Abnormal   Collection Time: 06/25/21  2:28 AM  Result Value Ref Range   HCT 23.8 (L) 39.0 - 52.0 %    Comment: Performed at [facility], [place] [address] [place], [place] [address]  Basic metabolic panel     Status: Abnormal   Collection Time: 06/25/21  5:23 AM  Result Value Ref Range   Sodium 135 135 - 145 mmol/L   Potassium 4.3 3.5 - 5.1 mmol/L   Chloride 104 98 - 111 mmol/L   CO2 26 22 - 32 mmol/L   Glucose, Bld 78 70 - 99 mg/dL    Comment: Glucose reference range applies only to samples taken after fasting for at least 8 hours.   BUN 28 (H) 8 - 23 mg/dL   Creatinine, Ser 1.52 (H) 0.61 - 1.24 mg/dL   Calcium 8.4 (L) 8.9 - 10.3 mg/dL   GFR, Estimated 42 (L) >60 mL/min    Comment: (NOTE) Calculated using the CKD-EPI Creatinine Equation (2021)    Anion gap 5 5 - 15    Comment: Performed at [facility] [address] [place], [place] [address]  Hemoglobin     Status: Abnormal   Collection Time: 06/25/21  5:23 AM   Result Value Ref Range   Hemoglobin 8.0 (L) 13.0 - 17.0 g/dL    Comment: Performed at [facility] [address] [place], [place] [address]  Hematocrit     Status: Abnormal   Collection Time: 06/25/21  5:23 AM  Result Value Ref Range   HCT 24.7 (L) 39.0 - 52.0 %    Comment: Performed at [facility], [place] [address], [place], [place] [address]  Protime-INR     Status: Abnormal   Collection Time: 06/25/21  5:23 AM  Result Value Ref Range   Prothrombin Time 38.5 (H) 11.4 - 15.2 seconds   INR 3.9 (H) 0.8 - 1.2    Comment: (NOTE) INR goal varies based on device and disease states. Performed at [facility], [place] [address] [place], [place] [address]   Hemoglobin     Status: Abnormal   Collection Time: 06/25/21 11:58 AM  Result Value Ref Range   Hemoglobin 8.3 (L) 13.0 - 17.0 g/dL    Comment: Performed at [facility] [address], [place], [place] [address]  Hematocrit     Status: Abnormal   Collection Time: 06/25/21 11:58 AM  Result Value Ref Range   HCT 25.6 (L) 39.0 - 52.0 %    Comment: Performed at [facility] [address], [place], [place] [address]  Vitamin B12     Status: None   Collection Time: 06/25/21 11:58 AM  Result Value Ref Range   Vitamin B-12 587 180 - 914 pg/mL    Comment: (NOTE) This assay is not validated for testing neonatal or myeloproliferative syndrome specimens for Vitamin B12 levels. Performed at [facility], [place] [address] [place], [place]  [address]   Basic metabolic panel     Status: Abnormal   Collection Time: 06/26/21 12:53 AM  Result Value Ref Range   Sodium 134 (L) 135 - 145 mmol/L   Potassium 4.4 3.5 - 5.1 mmol/L   Chloride 105 98 - 111 mmol/L   CO2 22 22 - 32 mmol/L   Glucose, Bld 89 70 - 99 mg/dL    Comment: Glucose reference range applies only to samples taken after fasting for at least 8 hours.   BUN 23 8 - 23 mg/dL   Creatinine, Ser 1.37 (H) 0.61 - 1.24 mg/dL   Calcium 8.1 (L) 8.9 - 10.3  mg/dL   GFR, Estimated 48 (L) >60 mL/min    Comment: (NOTE) Calculated using the CKD-EPI Creatinine Equation (2021)    Anion gap 7 5 - 15    Comment: Performed at [facility] [address], [place], [place] [address]  Protime-INR     Status: Abnormal   Collection Time: 06/26/21 12:53 AM  Result Value Ref Range   Prothrombin Time 19.8 (H) 11.4 - 15.2 seconds   INR 1.7 (H) 0.8 - 1.2    Comment: (NOTE) INR goal varies based on device and disease states. Performed at [facility], [place] [address], [place], [place] [address]   CBC     Status: Abnormal   Collection Time: 06/26/21 12:53 AM  Result Value Ref Range   WBC 2.6 (L) 4.0 - 10.5 K/uL   RBC 2.57 (L) 4.22 - 5.81 MIL/uL   Hemoglobin 8.2 (L) 13.0 - 17.0 g/dL   HCT 24.9 (L) 39.0 - 52.0 %   MCV 96.9 80.0 - 100.0 fL   MCH 31.9 26.0 - 34.0 pg   MCHC 32.9 30.0 - 36.0 g/dL   RDW 16.3 (H) 11.5 - 15.5 %   Platelets 156 150 - 400 K/uL   nRBC 0.0 0.0 - 0.2 %    Comment: Performed at [facility], [place] [address], [place], [place] [address]    Studies/Results: CT ANGIO GI BLEED  Result Date: 06/24/2021 CLINICAL DATA:  Evaluate for lower GI tract bleed. EXAM: CTA ABDOMEN AND PELVIS WITHOUT AND WITH CONTRAST TECHNIQUE: Multidetector CT imaging of the abdomen and pelvis was performed using the standard protocol during bolus administration of intravenous contrast. Multiplanar reconstructed images and MIPs were obtained and reviewed to evaluate the vascular anatomy. CONTRAST:  82mL OMNIPAQUE IOHEXOL 350 MG/ML SOLN COMPARISON:  12/15/20 FINDINGS: VASCULAR Aorta: Normal caliber aorta without aneurysm, dissection, vasculitis or significant stenosis. Aortic atherosclerotic calcifications. None Celiac: Calcified plaque with approximately 50% stenosis at the origin of the celiac artery noted. SMA: Calcified plaque with approximately 60% stenosis at the origin of the SMA. Renals: Calcified plaque at the origin of both renal arteries  results in greater than 50% stenosis bilaterally. IMA: Appears patent with greater than 50% stenosis at the origin. Inflow: Patent without evidence of aneurysm, dissection, vasculitis or significant stenosis. Proximal Outflow: Bilateral common femoral and visualized portions of the superficial and profunda femoral arteries are patent. There is non flow limiting dissection involving the right external iliac artery, image 601 through image 663/11. Veins: No obvious venous abnormality within the limitations of this arterial phase study. Review of the MIP images confirms the above findings. NON-VASCULAR Lower chest: Cardiac enlargement.  Lung bases are clear. Hepatobiliary: No acute abnormality. Low-density structure within segment 8 of the liver is technically too small to characterize measuring 7 mm, image 29/17. Gallbladder appears normal.  No bile duct dilatation. Pancreas: Unremarkable. No pancreatic ductal dilatation or surrounding inflammatory changes. Spleen: Normal in size without focal abnormality. Adrenals/Urinary Tract: Normal adrenal glands. No kidney stones or obstructive uropathy identified. Several subcentimeter low-density foci are identified within the right renal cortex which are technically too small to characterize measuring less than 1 cm. Urinary bladder is largely obscured by beam hardening artifact from patient's bilateral hip arthroplasty devices. Stomach/Bowel: Within the mid transverse colon there is a masslike intraluminal filling defect with avid arterial phase enhancement measuring 4.1 by 3.7 by 3.5 cm, image 42/13 and image 90/5. No additional abnormal areas of intraluminal contrast enhancement identified. No bowel wall thickening, inflammation or distension. Lymphatic: No enlarged lymph nodes. Reproductive: Prostate gland is largely obscured by streak artifact from bilateral hip arthroplasty devices. Other: Large left inguinal hernia contains a nonobstructed loop of large bowel.  Musculoskeletal: Status post bilateral hip arthroplasty. Scoliosis and degenerative disc disease is identified. IMPRESSION: 1. Avid arterial phase enhancing masslike filling defect within the mid transverse colon is identified. Although conceivably this could represent a large blood clot the diagnosis of exclusion is a primary colonic neoplasm. Further evaluation with colonoscopy is recommended. 2. Extensive atherosclerotic disease noted with stenosis noted at the origin of the celiac artery, SMA, IMA and both renal arteries. 3. 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 4. Large left inguinal hernia contains a nonobstructed loop of large bowel. 5. Aortic Atherosclerosis (ICD10-I70.0). These results were called by telephone at the time of interpretation on 06/24/2021 at 9:47 pm to provider [facility] , who verbally acknowledged these results. Electronically Signed   By: Kerby Moors M.D.   On: 06/24/2021 21:48    Medications: I have reviewed the patient's current medications.  Assessment: Rectal bleeding Abnormal CAT scan showing mid transverse colon lesion?  Large blood clot versus primary colonic neoplasm Coumadin on hold  Hemoglobin stable at 8.2 INR 1.7, plan is to start patient on IV heparin which is to be held 6 hours prior to colonoscopy  Plan: Continue clear liquid diet Colonic prep today Plan colonoscopy in a.m., recommend holding heparin after 2 AM.  Ronnette Juniper, MD 06/26/2021, 9:09 AM

## 2021-06-27 ENCOUNTER — Inpatient Hospital Stay (HOSPITAL_COMMUNITY): Payer: Medicare Other | Admitting: Anesthesiology

## 2021-06-27 ENCOUNTER — Encounter (HOSPITAL_COMMUNITY)
Admission: EM | Disposition: A | Discharge status: Skilled Nursing Facility | Payer: Self-pay | Source: Skilled Nursing Facility | Attending: Internal Medicine

## 2021-06-27 ENCOUNTER — Encounter (HOSPITAL_COMMUNITY): Payer: Self-pay | Admitting: Family Medicine

## 2021-06-27 DIAGNOSIS — D649 Anemia, unspecified: Secondary | ICD-10-CM | POA: Diagnosis not present

## 2021-06-27 DIAGNOSIS — K922 Gastrointestinal hemorrhage, unspecified: Secondary | ICD-10-CM | POA: Diagnosis not present

## 2021-06-27 HISTORY — PX: BIOPSY: SHX5522

## 2021-06-27 HISTORY — PX: COLONOSCOPY WITH PROPOFOL: SHX5780

## 2021-06-27 LAB — CBC
HCT: 26.3 % — ABNORMAL LOW (ref 39.0–52.0)
Hemoglobin: 8.6 g/dL — ABNORMAL LOW (ref 13.0–17.0)
MCH: 32 pg (ref 26.0–34.0)
MCHC: 32.7 g/dL (ref 30.0–36.0)
MCV: 97.8 fL (ref 80.0–100.0)
Platelets: 172 10*3/uL (ref 150–400)
RBC: 2.69 MIL/uL — ABNORMAL LOW (ref 4.22–5.81)
RDW: 16.3 % — ABNORMAL HIGH (ref 11.5–15.5)
WBC: 3.2 10*3/uL — ABNORMAL LOW (ref 4.0–10.5)
nRBC: 0 % (ref 0.0–0.2)

## 2021-06-27 LAB — PROTIME-INR
INR: 1.2 (ref 0.8–1.2)
Prothrombin Time: 15.6 seconds — ABNORMAL HIGH (ref 11.4–15.2)

## 2021-06-27 LAB — BASIC METABOLIC PANEL
Anion gap: 7 (ref 5–15)
BUN: 15 mg/dL (ref 8–23)
CO2: 23 mmol/L (ref 22–32)
Calcium: 8.3 mg/dL — ABNORMAL LOW (ref 8.9–10.3)
Chloride: 103 mmol/L (ref 98–111)
Creatinine, Ser: 1.39 mg/dL — ABNORMAL HIGH (ref 0.61–1.24)
GFR, Estimated: 47 mL/min — ABNORMAL LOW (ref >60)
Glucose, Bld: 99 mg/dL (ref 70–99)
Potassium: 4.6 mmol/L (ref 3.5–5.1)
Sodium: 133 mmol/L — ABNORMAL LOW (ref 135–145)

## 2021-06-27 LAB — HEPARIN LEVEL (UNFRACTIONATED): Heparin Unfractionated: 0.44 IU/mL (ref 0.30–0.70)

## 2021-06-27 SURGERY — COLONOSCOPY WITH PROPOFOL
Anesthesia: Monitor Anesthesia Care

## 2021-06-27 MED ORDER — HEPARIN (PORCINE) 25000 UT/250ML-% IV SOLN
1100.0000 [IU]/h | INTRAVENOUS | Status: DC
Start: 2021-06-27 — End: 2021-06-28
  Administered 2021-06-27 (×2): 1150 [IU]/h via INTRAVENOUS
  Filled 2021-06-27: qty 250

## 2021-06-27 MED ORDER — HEPARIN (PORCINE) 25000 UT/250ML-% IV SOLN: 1150.0000 [IU]/h | INTRAVENOUS | Status: DC

## 2021-06-27 MED ORDER — EPHEDRINE SULFATE-NACL 50-0.9 MG/10ML-% IV SOSY
PREFILLED_SYRINGE | INTRAVENOUS | Status: DC | PRN
  Administered 2021-06-27: 10 mg via INTRAVENOUS

## 2021-06-27 MED ORDER — PROPOFOL 500 MG/50ML IV EMUL
INTRAVENOUS | Status: DC | PRN
  Administered 2021-06-27: 100 ug/kg/min via INTRAVENOUS

## 2021-06-27 MED ORDER — LACTATED RINGERS IV SOLN
INTRAVENOUS | Status: AC | PRN
  Administered 2021-06-27: 10 mL/h via INTRAVENOUS

## 2021-06-27 MED ORDER — PROPOFOL 10 MG/ML IV BOLUS
INTRAVENOUS | Status: DC | PRN
  Administered 2021-06-27: 20 mg via INTRAVENOUS
  Administered 2021-06-27: 10 mg via INTRAVENOUS

## 2021-06-27 SURGICAL SUPPLY — 22 items

## 2021-06-27 NOTE — Progress Notes (Signed)
PROGRESS NOTE    Richard Spears  HYQ:[id number] DOB: 1926/05/27 DOA: 06/24/2021 PCP: [name], MD  Brief Narrative: [name] from independent living with spouse with history of paroxysmal atrial fibrillation and mechanical mitral valve replacement with [employer] metallic valve on Coumadin, chronic anemia, hypertension, chronic diastolic CHF, presented to the ED with 2 episodes of hematochezia, without abdominal pain fevers or chills -In the ED hemoglobin was down to 8.6, INR was 3.9 -CT angio abdomen-noted enhancing masslike filling defect within the mid transverse colon is identified. Although conceivably this could represent a large blood clot the diagnosis of exclusion is a primary colonic neoplasm.    Assessment & Plan:   Lower GI bleed, ? mass Acute blood loss anemia -CTA raises concern for large blood clot versus primary colon malignancy -On Coumadin for mechanical mitral valve and A. fib, INR 3.9 on admission -Gastroenterology following, was given vitamin K , INR down to 1.7 -Treated with IV heparin yesterday, now on hold for colonoscopy this morning, depending on findings will need to decide regarding resumption of Coumadin versus heparin -hemoglobin is stable,  transfuse if drops less than 8  S/P mechanical MVR Paroxysmal atrial fibrillation -INR 1.2 today see discussion above regarding anticoagulation, restart heparin after colonoscopy if stable  Hypertension -BP stable, monitor without antihypertensives at this time  Chronic diastolic CHF -Clinically appears euvolemic, Lasix on hold  AKI on CKD 2 -Hold Lasix, monitor -Baseline creatinine around 1.2  Chronic venous stasis with ulcers and scabs  Leukopenia -Chronic, monitor  DVT prophylaxis: SCDs Code Status: DNR Family Communication: Discussed patient in detail, no family at bedside Disposition Plan:  Status is: Inpatient  Remains inpatient appropriate because: Severity of illness     Consultants:  Gastroenterology  Procedures: Colonoscopy today  Antimicrobials:    Subjective: -Feels okay, about to be wheeled out for colonoscopy  Objective: Vitals:   06/27/21 0910 06/27/21 0920 06/27/21 0936 06/27/21 1207  BP: 138/76 (!) 147/77 (!) 161/83 136/70  Pulse: 94 (!) 109 (!) 104 (!) 101  Resp: 20 14 16 17   Temp:   97.9 F (36.6 C) 97.7 F (36.5 C)  TempSrc:   Axillary Oral  SpO2: 99% 99% 99% 99%  Weight:      Height:        Intake/Output Summary (Last 24 hours) at 06/27/2021 1246 Last data filed at 06/27/2021 1100 Gross per 24 hour  Intake 266.79 ml  Output 375 ml  Net -108.21 ml   Filed Weights   06/24/21 2254 06/27/21 0753  Weight: 68 kg 68 kg    Examination:  General exam: Gen: Awake, Alert, Oriented X 3, no distress HEENT: no JVD Lungs: Good air movement bilaterally, CTAB CVS: S1S2/RRR Abd: soft, Non tender, non distended, BS present Extremities: No edema Skin: no new rashes on exposed skin  Psychiatry: Judgement and insight appear normal. Mood & affect appropriate.     Data Reviewed:   CBC: Recent Labs  Lab 06/24/21 1220 06/24/21 2026 06/25/21 0228 06/25/21 0523 06/25/21 1158 06/26/21 0053 06/27/21 0044  WBC 3.7*  --   --   --   --  2.6* 3.2*  NEUTROABS 2.5  --   --   --   --   --   --   HGB 8.6*   < > 7.9* 8.0* 8.3* 8.2* 8.6*  HCT 26.7*   < > 23.8* 24.7* 25.6* 24.9* 26.3*  MCV 98.9  --   --   --   --  96.9  97.8  PLT 164  --   --   --   --  156 172   < > = values in this interval not displayed.   Basic Metabolic Panel: Recent Labs  Lab 06/24/21 1220 06/25/21 0523 06/26/21 0053 06/27/21 0044  NA 132* 135 134* 133*  K 4.1 4.3 4.4 4.6  CL 102 104 105 103  CO2 25 26 22 23   GLUCOSE 122* 78 89 99  BUN 31* 28* 23 15  CREATININE 1.57* 1.52* 1.37* 1.39*  CALCIUM 8.4* 8.4* 8.1* 8.3*   GFR: Estimated Creatinine Clearance: 31.9 mL/min (A) (by C-G formula based on SCr of 1.39 mg/dL (H)). Liver Function Tests: Recent  Labs  Lab 06/24/21 1220  AST 28  ALT 17  ALKPHOS 44  BILITOT 0.6  PROT 6.1*  ALBUMIN 2.8*   Recent Labs  Lab 06/24/21 1220  LIPASE 34   No results for input(s): AMMONIA in the last 168 hours. Coagulation Profile: Recent Labs  Lab 06/24/21 1220 06/25/21 0523 06/26/21 0053 06/27/21 0044  INR 3.9* 3.9* 1.7* 1.2   Cardiac Enzymes: No results for input(s): CKTOTAL, CKMB, CKMBINDEX, TROPONINI in the last 168 hours. BNP (last 3 results) No results for input(s): PROBNP in the last 8760 hours. HbA1C: No results for input(s): HGBA1C in the last 72 hours. CBG: No results for input(s): GLUCAP in the last 168 hours. Lipid Profile: No results for input(s): CHOL, HDL, LDLCALC, TRIG, CHOLHDL, LDLDIRECT in the last 72 hours. Thyroid Function Tests: No results for input(s): TSH, T4TOTAL, FREET4, T3FREE, THYROIDAB in the last 72 hours. Anemia Panel: Recent Labs    06/25/21 1158  VITAMINB12 587   Urine analysis: No results found for: COLORURINE, APPEARANCEUR, LABSPEC, PHURINE, GLUCOSEU, HGBUR, BILIRUBINUR, KETONESUR, PROTEINUR, UROBILINOGEN, NITRITE, LEUKOCYTESUR Sepsis Labs: @LABRCNTIP (procalcitonin:4,lacticidven:4)  ) Recent Results (from the past 240 hour(s))  Resp Panel by RT-PCR (Flu A&B, Covid) Nasopharyngeal Swab     Status: None   Collection Time: 06/24/21  6:25 PM   Specimen: Nasopharyngeal Swab; Nasopharyngeal(NP) swabs in vial transport medium  Result Value Ref Range Status   SARS Coronavirus 2 by RT PCR NEGATIVE NEGATIVE Final    Comment: (NOTE) SARS-CoV-2 target nucleic acids are NOT DETECTED.  The SARS-CoV-2 RNA is generally detectable in upper respiratory specimens during the acute phase of infection. The lowest concentration of SARS-CoV-2 viral copies this assay can detect is 138 copies/mL. A negative result does not preclude SARS-Cov-2 infection and should not be used as the sole basis for treatment or other patient management decisions. A negative result  may occur with  improper specimen collection/handling, submission of specimen other than nasopharyngeal swab, presence of viral mutation(s) within the areas targeted by this assay, and inadequate number of viral copies(<138 copies/mL). A negative result must be combined with clinical observations, patient history, and epidemiological information. The expected result is Negative.  Fact Sheet for Patients:  EntrepreneurPulse.com.au  Fact Sheet for Healthcare Providers:  IncredibleEmployment.be  This test is no t yet approved or cleared by the [place] FDA and  has been authorized for detection and/or diagnosis of SARS-CoV-2 by FDA under an Emergency Use Authorization (EUA). This EUA will remain  in effect (meaning this test can be used) for the duration of the COVID-19 declaration under Section 564(b)(1) of the Act, 21 U.S.C.section 360bbb-3(b)(1), unless the authorization is terminated  or revoked sooner.       Influenza A by PCR NEGATIVE NEGATIVE Final   Influenza B by PCR NEGATIVE NEGATIVE Final  Comment: (NOTE) The Xpert Xpress SARS-CoV-2/FLU/RSV plus assay is intended as an aid in the diagnosis of influenza from Nasopharyngeal swab specimens and should not be used as a sole basis for treatment. Nasal washings and aspirates are unacceptable for Xpert Xpress SARS-CoV-2/FLU/RSV testing.  Fact Sheet for Patients: EntrepreneurPulse.com.au  Fact Sheet for Healthcare Providers: IncredibleEmployment.be  This test is not yet approved or cleared by the [place] FDA and has been authorized for detection and/or diagnosis of SARS-CoV-2 by FDA under an Emergency Use Authorization (EUA). This EUA will remain in effect (meaning this test can be used) for the duration of the COVID-19 declaration under Section 564(b)(1) of the Act, 21 U.S.C. section 360bbb-3(b)(1), unless the authorization is terminated  or revoked.  Performed at [facility], [place] [address] [place], [place] [address]     Radiology Studies: No results found.   Scheduled Meds:  sodium chloride flush  3 mL Intravenous Q12H   Continuous Infusions:  heparin 1,150 Units/hr (06/27/21 1013)     LOS: 3 days    Time spent: 30min  Domenic Polite, MD [facility]   06/27/2021, 12:46 PM

## 2021-06-27 NOTE — Plan of Care (Signed)

## 2021-06-27 NOTE — Progress Notes (Signed)
Richard Spears 8:21 AM  Subjective: Patient did okay with the prep and we reviewed his history and hospital chart and discussed his case with my partner Dr. Therisa Doyne and he saw a little blood with the prep but none at the end and he has no new complaints  Objective: Vital signs stable afebrile no acute distress exam please see preassessment evaluation labs stable CT reviewed  Assessment: Abnormal CT GI bleed and patient on blood thinner  Plan: Okay to proceed with colonoscopy with anesthesia assistance  [facility]  office [phone] After 5PM or if no answer call [phone]

## 2021-06-27 NOTE — Transfer of Care (Signed)
Immediate Anesthesia Transfer of Care Note  Patient: Richard Spears  Procedure(s) Performed: COLONOSCOPY WITH PROPOFOL BIOPSY  Patient Location: PACU  Anesthesia Type:MAC  Level of Consciousness: drowsy and patient cooperative  Airway & Oxygen Therapy: Patient Spontanous Breathing and Patient connected to nasal cannula oxygen  Post-op Assessment: Report given to RN, Post -op Vital signs reviewed and stable and Patient moving all extremities  Post vital signs: Reviewed and stable  Last Vitals:  Vitals Value Taken Time  BP 97/73 06/27/21 0850  Temp    Pulse 116 06/27/21 0852  Resp 18 06/27/21 0852  SpO2 100 % 06/27/21 0852  Vitals shown include unvalidated device data.  Last Pain:  Vitals:   06/27/21 0753  TempSrc: Temporal  PainSc: 0-No pain      Patients Stated Pain Goal: 0 (81/44/81 8563)  Complications: No notable events documented.

## 2021-06-27 NOTE — Anesthesia Procedure Notes (Signed)
Procedure Name: MAC Date/Time: 06/27/2021 8:20 AM Performed by: Moshe Salisbury, CRNA Pre-anesthesia Checklist: Patient identified, Emergency Drugs available, Suction available and Patient being monitored Oxygen Delivery Method: Nasal cannula Placement Confirmation: positive ETCO2 Dental Injury: Teeth and Oropharynx as per pre-operative assessment

## 2021-06-27 NOTE — Op Note (Signed)
[facility] Patient Name: Richard Spears Procedure Date : 06/27/2021 MRN: [id number] Attending MD: Clarene Essex , MD Date of Birth: [birth year] CSN: [id number] Age: 87 Admit Type: Inpatient Procedure:                Colonoscopy Indications:              Hematochezia, Acute post hemorrhagic anemia,                            Abnormal CT of the GI tract Providers:                Clarene Essex, MD, Jeanella Cara, RN, Cletis [place], Technician, Claybon Jabs CRNA, CRNA Referring MD:              Medicines:                Propofol total dose 893 mg IV Complications:            No immediate complications. Estimated Blood Loss:     Estimated blood loss: none. Procedure:                Pre-Anesthesia Assessment:                           - Prior to the procedure, a History and Physical                            was performed, and patient medications and                            allergies were reviewed. The patient's tolerance of                            previous anesthesia was also reviewed. The risks                            and benefits of the procedure and the sedation                            options and risks were discussed with the patient.                            All questions were answered, and informed consent                            was obtained. Prior Anticoagulants: The patient has                            taken heparin, last dose was day of procedure. ASA                            Grade Assessment: III - A patient with severe  systemic disease. After reviewing the risks and                            benefits, the patient was deemed in satisfactory                            condition to undergo the procedure.                           After obtaining informed consent, the colonoscope                            was passed under direct vision. Throughout the                             procedure, the patient's blood pressure, pulse, and                            oxygen saturations were monitored continuously. The                            PCF-HQ190TL ([id number] Olympus peds colonoscope was                            introduced through the anus and advanced to the the                            transverse colon. The rectum was photographed. The                            colonoscopy was performed without difficulty. The                            patient tolerated the procedure well. The quality                            of the bowel preparation was adequate. Scope In: 8:29:01 AM Scope Out: 8:41:50 AM Total Procedure Duration: 0 hours 12 minutes 49 seconds  Findings:      An ulcerated partially obstructing large mass was found in the mid       transverse colon. The mass was circumferential. No bleeding was present.       We were unable to advance the scope past it biopsies were taken with a       cold forceps for histology.      The exam was otherwise without abnormality on direct and retroflexion       views. 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 Biopsied.                           - The examination was otherwise normal on  direct                            and retroflexion views. Unable to advance the scope                            past the tumor Recommendation:           - Clear liquid diet if surgery could be done in the                            next day or 2 while off Coumadin and adequately                            prepped no he would not have to retake the prep                            otherwise may advance diet and continue work-up as                            an outpatient although I expect he will need                            surgery at some point but he does want to go home                            and take care of his wife                           -  Resume heparin at prior dose today. Please do not                            rebolus                           - Continue present medications.                           - Return to GI clinic PRN. Recommend surgical                            consultation today if not already done for above                            reasons                           - Telephone GI clinic for pathology results in 4                            days.                           - Telephone GI clinic if symptomatic PRN. Procedure Code(s):        ---  Professional ---                           [phone], 67, Colonoscopy, flexible; with biopsy,                            single or multiple Diagnosis Code(s):        --- Professional ---                           D49.0, Neoplasm of unspecified behavior of                            digestive system                           K56.690, Other partial intestinal obstruction                           K92.1, Melena (includes Hematochezia)                           D62, Acute posthemorrhagic anemia                           R93.3, Abnormal findings on diagnostic imaging of                            other parts of digestive tract CPT copyright 2019 American Medical Association. All rights reserved. The codes documented in this report are preliminary and upon coder review may  be revised to meet current compliance requirements. Clarene Essex, MD 06/27/2021 8:51:34 AM This report has been signed electronically. Number of Addenda: 0

## 2021-06-27 NOTE — Consult Note (Signed)
CC: colon mass consult  Referring: Dr. Clarene Essex  HPI: Richard Spears is an 87 y.o. male with a past medical history significant for atrial fibrillation and mechanical valve replacement on Coumadin who presented with blood in his bowel movements.  He underwent a colonoscopy today showing a near obstructing mass in the transverse colon by Dr. Watt Climes.  Biopsies were obtained.  The patient reports he is currently doing well.  He has had no abdominal pain.  He has had no further bleeding from his bowels now that he is off of his anticoagulation medications.  He does walk with a walker.  He denies any chest pain or shortness of breath.  He has had no previous abdominal surgery  Past Medical History:  Diagnosis Date   Anemia    leakoppenia   BPH (benign prostatic hypertrophy)    Bullous pemphigoid    Wilhemina Spears, March 2011, right forearm squamous cell carcinoma   Chronic anticoagulation    systemic   Colon polyp    transverse, 2002   History of peptic ulcer    remote, 3/95   Hx of actinic keratosis    Hx of basal cell carcinoma    Hx of squamous cell carcinoma of skin    Hyperlipidemia    Left inguinal hernia    Moderate aortic insufficiency 2009   audible aortic insufficiency on 1/09 echo   PAF (paroxysmal atrial fibrillation) ([name]) 01/17/2014   On Warfarin.   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 Jude mechanical valve   ORIF FEMUR FRACTURE Left 08/28/2020   Procedure: OPEN REDUCTION INTERNAL FIXATION (ORIF) DISTAL FEMUR FRACTURE;  Surgeon: Rod Can, MD;  Location: [facility];  Service:  Orthopedics;  Laterality: Left;   TOTAL HIP ARTHROPLASTY Right 10/12/2017   Procedure: RIGHT TOTAL HIP ARTHROPLASTY ANTERIOR APPROACH;  Surgeon: Gaynelle Arabian, MD;  Location: [facility];  Service: Orthopedics;  Laterality: Right;   TRANSTHORACIC ECHOCARDIOGRAM  12/2018   Unable to assess diastolic function because of A. fib. Normal RV function, but moderately elevated RVSP.  Severe biatrial enlargement. S/P St Jude bileaflet mechanical MVR that appears to be functioning normally. Mitral valve regurgitation cannot assess due to mechanical valve shadowing. MV Mean grad: 7.0 mmHg MV Area (PHT): 3.38 cm (stable for valve).  Mild Ao Sclerosis, Mild-Mod AI   TRANSTHORACIC ECHOCARDIOGRAM  08/'17; 10/'18    a) Mild conc LVH. EF 55-60%. No RWMA. Mod AI. Mechanical MV prosthesis functioning properly. LAD dilation.;; b)  EF 55-60%.  Mo AI.  Bileaflet Saint Jude mechanical MV with no paravalvular leak.  Severe LA dilation.  Minimally elevated PAP    Family History  Problem Relation Age of Onset   Hypertension Mother    Lung cancer Sister    COPD Brother    Cancer Brother    Other Sister        polio   Lupus Son     Social:  reports that he quit smoking about 63 years ago. His smoking use included cigarettes. He started smoking about 76 years  ago. He has a 13.00 pack-year smoking history. He has never used smokeless tobacco. He reports current alcohol use. He reports that he does not use drugs.  Allergies:  Allergies  Allergen Reactions   Flexeril [Cyclobenzaprine] Diarrhea    Medications: I have reviewed the patient's current medications.  Results for orders placed or performed during the hospital encounter of 06/24/21 (from the past 48 hour(s))  Hemoglobin     Status: Abnormal   Collection Time: 06/25/21 11:58 AM  Result Value Ref Range   Hemoglobin 8.3 (L) 13.0 - 17.0 g/dL    Comment: Performed at [facility], [address] [place], [place] [address]  Hematocrit     Status: Abnormal    Collection Time: 06/25/21 11:58 AM  Result Value Ref Range   HCT 25.6 (L) 39.0 - 52.0 %    Comment: Performed at [facility] [address] [place], [place] [address]  Vitamin B12     Status: None   Collection Time: 06/25/21 11:58 AM  Result Value Ref Range   Vitamin B-12 587 180 - 914 pg/mL    Comment: (NOTE) This assay is not validated for testing neonatal or myeloproliferative syndrome specimens for Vitamin B12 levels. Performed at [facility], [place] [address], [place], [place] [address]   Basic metabolic panel     Status: Abnormal   Collection Time: 06/26/21 12:53 AM  Result Value Ref Range   Sodium 134 (L) 135 - 145 mmol/L   Potassium 4.4 3.5 - 5.1 mmol/L   Chloride 105 98 - 111 mmol/L   CO2 22 22 - 32 mmol/L   Glucose, Bld 89 70 - 99 mg/dL    Comment: Glucose reference range applies only to samples taken after fasting for at least 8 hours.   BUN 23 8 - 23 mg/dL   Creatinine, Ser 1.37 (H) 0.61 - 1.24 mg/dL   Calcium 8.1 (L) 8.9 - 10.3 mg/dL   GFR, Estimated 48 (L) >60 mL/min    Comment: (NOTE) Calculated using the CKD-EPI Creatinine Equation (2021)    Anion gap 7 5 - 15    Comment: Performed at [facility] [address] [place], [place] [address]  Protime-INR     Status: Abnormal   Collection Time: 06/26/21 12:53 AM  Result Value Ref Range   Prothrombin Time 19.8 (H) 11.4 - 15.2 seconds   INR 1.7 (H) 0.8 - 1.2    Comment: (NOTE) INR goal varies based on device and disease states. Performed at [facility], [place] [address] [place], [place] [address]   CBC     Status: Abnormal   Collection Time: 06/26/21 12:53 AM  Result Value Ref Range   WBC 2.6 (L) 4.0 - 10.5 K/uL   RBC 2.57 (L) 4.22 - 5.81 MIL/uL   Hemoglobin 8.2 (L) 13.0 - 17.0 g/dL   HCT 24.9 (L) 39.0 - 52.0 %   MCV 96.9 80.0 - 100.0 fL   MCH 31.9 26.0 - 34.0 pg   MCHC 32.9 30.0 - 36.0 g/dL   RDW 16.3 (H) 11.5 - 15.5 %   Platelets 156 150 - 400 K/uL   nRBC 0.0 0.0 - 0.2 %     Comment: Performed at [facility], [place] [address] [place], [place] [address]  Heparin level (unfractionated)     Status: Abnormal   Collection Time: 06/26/21  5:59 PM  Result Value Ref Range   Heparin Unfractionated <0.10 (L)  0.30 - 0.70 IU/mL    Comment: (NOTE) The clinical reportable range upper limit is being lowered to >1.10 to align with the FDA approved guidance for the current laboratory assay.  If heparin results are below expected values, and patient dosage has  been confirmed, suggest follow up testing of antithrombin III levels. Performed at [facility], [place] [address], [place], [place] [address]   Basic metabolic panel     Status: Abnormal   Collection Time: 06/27/21 12:44 AM  Result Value Ref Range   Sodium 133 (L) 135 - 145 mmol/L   Potassium 4.6 3.5 - 5.1 mmol/L   Chloride 103 98 - 111 mmol/L   CO2 23 22 - 32 mmol/L   Glucose, Bld 99 70 - 99 mg/dL    Comment: Glucose reference range applies only to samples taken after fasting for at least 8 hours.   BUN 15 8 - 23 mg/dL   Creatinine, Ser 1.39 (H) 0.61 - 1.24 mg/dL   Calcium 8.3 (L) 8.9 - 10.3 mg/dL   GFR, Estimated 47 (L) >60 mL/min    Comment: (NOTE) Calculated using the CKD-EPI Creatinine Equation (2021)    Anion gap 7 5 - 15    Comment: Performed at [facility] [address], [place], [place] [address]  Protime-INR     Status: Abnormal   Collection Time: 06/27/21 12:44 AM  Result Value Ref Range   Prothrombin Time 15.6 (H) 11.4 - 15.2 seconds   INR 1.2 0.8 - 1.2    Comment: (NOTE) INR goal varies based on device and disease states. Performed at [facility], [place] [address], [place], [place] [address]   CBC     Status: Abnormal   Collection Time: 06/27/21 12:44 AM  Result Value Ref Range   WBC 3.2 (L) 4.0 - 10.5 K/uL   RBC 2.69 (L) 4.22 - 5.81 MIL/uL   Hemoglobin 8.6 (L) 13.0 - 17.0 g/dL   HCT 26.3 (L) 39.0 - 52.0 %   MCV 97.8 80.0 - 100.0 fL   MCH 32.0 26.0 - 34.0  pg   MCHC 32.7 30.0 - 36.0 g/dL   RDW 16.3 (H) 11.5 - 15.5 %   Platelets 172 150 - 400 K/uL   nRBC 0.0 0.0 - 0.2 %    Comment: Performed at [facility], [place] [address] [place], [place] [address]    No results found.  ROS - all of the below systems have been reviewed with the patient and positives are indicated with bold text General: chills, fever or night sweats Eyes: blurry vision or double vision ENT: epistaxis or sore throat Allergy/Immunology: itchy/watery eyes or nasal congestion Hematologic/Lymphatic: bleeding problems, blood clots or swollen lymph nodes Endocrine: temperature intolerance or unexpected weight changes Breast: new or changing breast lumps or nipple discharge Resp: cough, shortness of breath, or wheezing CV: chest pain or dyspnea on exertion GI: as per HPI GU: dysuria, trouble voiding, or hematuria MSK: joint pain or joint stiffness Neuro: TIA or stroke symptoms Derm: pruritus and skin lesion changes Psych: anxiety and depression  PE Blood pressure (!) 161/83, pulse (!) 104, temperature 97.9 F (36.6 C), temperature source Axillary, resp. rate 16, height 6' (1.829 m), weight 68 kg, SpO2 99 %. Constitutional: NAD; conversant; no deformities Eyes: Moist conjunctiva; no lid lag; anicteric; PERRL Neck: Trachea midline; no thyromegaly Lungs: Normal respiratory effort; no tactile fremitus CV: RRR; no palpable thrills; no pitting edema GI: Abd soft, nontender,  no hernias; no palpable hepatosplenomegaly MSK: Normal range of motion of extremities; no clubbing/cyanosis Psychiatric: Appropriate affect; alert and oriented x3 Lymphatic: No palpable cervical or axillary lymphadenopathy  Results for orders placed or performed during the hospital encounter of 06/24/21 (from the past 48 hour(s))  Hemoglobin     Status: Abnormal   Collection Time: 06/25/21 11:58 AM  Result Value Ref Range   Hemoglobin 8.3 (L) 13.0 - 17.0 g/dL    Comment: Performed at [facility], [address], [place], [place] [address]  Hematocrit     Status: Abnormal   Collection Time: 06/25/21 11:58 AM  Result Value Ref Range   HCT 25.6 (L) 39.0 - 52.0 %    Comment: Performed at [facility] [address], [place], [place] [address]  Vitamin B12     Status: None   Collection Time: 06/25/21 11:58 AM  Result Value Ref Range   Vitamin B-12 587 180 - 914 pg/mL    Comment: (NOTE) This assay is not validated for testing neonatal or myeloproliferative syndrome specimens for Vitamin B12 levels. Performed at [facility], [place] [address], [place], [place] [address]   Basic metabolic panel     Status: Abnormal   Collection Time: 06/26/21 12:53 AM  Result Value Ref Range   Sodium 134 (L) 135 - 145 mmol/L   Potassium 4.4 3.5 - 5.1 mmol/L   Chloride 105 98 - 111 mmol/L   CO2 22 22 - 32 mmol/L   Glucose, Bld 89 70 - 99 mg/dL    Comment: Glucose reference range applies only to samples taken after fasting for at least 8 hours.   BUN 23 8 - 23 mg/dL   Creatinine, Ser 1.37 (H) 0.61 - 1.24 mg/dL   Calcium 8.1 (L) 8.9 - 10.3 mg/dL   GFR, Estimated 48 (L) >60 mL/min    Comment: (NOTE) Calculated using the CKD-EPI Creatinine Equation (2021)    Anion gap 7 5 - 15    Comment: Performed at [facility] [address], [place], [place] [address]  Protime-INR     Status: Abnormal   Collection Time: 06/26/21 12:53 AM  Result Value Ref Range   Prothrombin Time 19.8 (H) 11.4 - 15.2 seconds   INR 1.7 (H) 0.8 - 1.2    Comment: (NOTE) INR goal varies based on device and disease states. Performed at [facility], [place] [address] [place], [place] [address]   CBC     Status: Abnormal   Collection Time: 06/26/21 12:53 AM  Result Value Ref Range   WBC 2.6 (L) 4.0 - 10.5 K/uL   RBC 2.57 (L) 4.22 - 5.81 MIL/uL   Hemoglobin 8.2 (L) 13.0 - 17.0 g/dL   HCT 24.9 (L) 39.0 - 52.0 %   MCV 96.9 80.0 - 100.0 fL   MCH 31.9 26.0 - 34.0 pg   MCHC 32.9 30.0 - 36.0 g/dL    RDW 16.3 (H) 11.5 - 15.5 %   Platelets 156 150 - 400 K/uL   nRBC 0.0 0.0 - 0.2 %    Comment: Performed at [facility], [place] [address], [place], [place] [address]  Heparin level (unfractionated)     Status: Abnormal   Collection Time: 06/26/21  5:59 PM  Result Value Ref Range   Heparin Unfractionated <0.10 (L) 0.30 - 0.70 IU/mL    Comment: (NOTE) The clinical reportable range upper limit is being lowered to >1.10 to align  with the FDA approved guidance for the current laboratory assay.  If heparin results are below expected values, and patient dosage has  been confirmed, suggest follow up testing of antithrombin III levels. Performed at [facility], [place] [address] [place], [place] [address]   Basic metabolic panel     Status: Abnormal   Collection Time: 06/27/21 12:44 AM  Result Value Ref Range   Sodium 133 (L) 135 - 145 mmol/L   Potassium 4.6 3.5 - 5.1 mmol/L   Chloride 103 98 - 111 mmol/L   CO2 23 22 - 32 mmol/L   Glucose, Bld 99 70 - 99 mg/dL    Comment: Glucose reference range applies only to samples taken after fasting for at least 8 hours.   BUN 15 8 - 23 mg/dL   Creatinine, Ser 1.39 (H) 0.61 - 1.24 mg/dL   Calcium 8.3 (L) 8.9 - 10.3 mg/dL   GFR, Estimated 47 (L) >60 mL/min    Comment: (NOTE) Calculated using the CKD-EPI Creatinine Equation (2021)    Anion gap 7 5 - 15    Comment: Performed at [facility] [address], [place], [place] [address]  Protime-INR     Status: Abnormal   Collection Time: 06/27/21 12:44 AM  Result Value Ref Range   Prothrombin Time 15.6 (H) 11.4 - 15.2 seconds   INR 1.2 0.8 - 1.2    Comment: (NOTE) INR goal varies based on device and disease states. Performed at [facility], [place] [address] [place], [place] [address]   CBC     Status: Abnormal   Collection Time: 06/27/21 12:44 AM  Result Value Ref Range   WBC 3.2 (L) 4.0 - 10.5 K/uL   RBC 2.69 (L) 4.22 - 5.81 MIL/uL   Hemoglobin 8.6 (L) 13.0 - 17.0  g/dL   HCT 26.3 (L) 39.0 - 52.0 %   MCV 97.8 80.0 - 100.0 fL   MCH 32.0 26.0 - 34.0 pg   MCHC 32.7 30.0 - 36.0 g/dL   RDW 16.3 (H) 11.5 - 15.5 %   Platelets 172 150 - 400 K/uL   nRBC 0.0 0.0 - 0.2 %    Comment: Performed at [facility], [place] [address], [place],  [address]    No results found.  I have personally reviewed the relevant CT imaging and endoscopy report and images  A/P: ELIZER BOSTIC is an 87 y.o. male with a transverse colon mass which is likely carcinoma.  Pathology is pending  I have reviewed his CT scan and there is no other evidence of metastatic disease.  I discussed the diagnosis with the patient.  Regardless of whether or not this is a malignancy, it is near obstructing and causing bleeding.  A laparoscopic assisted partial colectomy is recommended.  I discussed this with him in detail.  It would likely be Monday or Tuesday by our acute care surgeon of the week which is Dr. Windle Guard.  Currently we will keep him on clear liquids with boost shakes.  He may need a preoperative cardiac evaluation.  We will follow with you and we will determine the timing of surgery   I spent a total of 40 minutes in both face-to-face and non-face-to-face activities, excluding procedures performed, for this H&P on the date of this encounter.  Coralie Keens, MD [facility], [facility]

## 2021-06-27 NOTE — Progress Notes (Signed)
Pt has been taken down to endo room for scheduled colonoscopy.

## 2021-06-27 NOTE — Anesthesia Postprocedure Evaluation (Signed)
Anesthesia Post Note  Patient: Richard Spears  Procedure(s) Performed: COLONOSCOPY WITH PROPOFOL BIOPSY     Patient location during evaluation: PACU Anesthesia Type: MAC Level of consciousness: awake and alert, patient cooperative and oriented Pain management: pain level controlled Vital Signs Assessment: post-procedure vital signs reviewed and stable Respiratory status: spontaneous breathing, nonlabored ventilation and respiratory function stable Cardiovascular status: blood pressure returned to baseline and stable Postop Assessment: no apparent nausea or vomiting Anesthetic complications: no   No notable events documented.  Last Vitals:  Vitals:   06/27/21 0910 06/27/21 0936  BP: 138/76 (!) 161/83  Pulse: 94 (!) 104  Resp: 20 16  Temp:    SpO2: 99% 99%    Last Pain:  Vitals:   06/27/21 0850  TempSrc:   PainSc: 0-No pain                 Farris Geiman,E. Daine Croker

## 2021-06-27 NOTE — Progress Notes (Signed)
ANTICOAGULATION CONSULT NOTE  Pharmacy Consult for IV heparin  Indication: mechanical mitral valve  Allergies  Allergen Reactions   Flexeril [Cyclobenzaprine] Diarrhea    Patient Measurements: Height: 6' (182.9 cm) Weight: 68 kg (149 lb 14.6 oz) IBW/kg (Calculated) : 77.6 Heparin Dosing Weight: 68 kg   Vital Signs: Temp: 97.9 F (36.6 C) (12/31 0936) Temp Source: Axillary (12/31 0936) BP: 161/83 (12/31 0936) Pulse Rate: 104 (12/31 0936)  Labs: Recent Labs    06/24/21 1220 06/24/21 2026 06/25/21 0523 06/25/21 1158 06/26/21 0053 06/26/21 1759 06/27/21 0044  HGB 8.6*   < > 8.0* 8.3* 8.2*  --  8.6*  HCT 26.7*   < > 24.7* 25.6* 24.9*  --  26.3*  PLT 164  --   --   --  156  --  172  LABPROT 38.4*  --  38.5*  --  19.8*  --  15.6*  INR 3.9*  --  3.9*  --  1.7*  --  1.2  HEPARINUNFRC  --   --   --   --   --  <0.10*  --   CREATININE 1.57*  --  1.52*  --  1.37*  --  1.39*   < > = values in this interval not displayed.     Estimated Creatinine Clearance: 31.9 mL/min (A) (by C-G formula based on SCr of 1.39 mg/dL (H)).   Medical History: Past Medical History:  Diagnosis Date   Anemia    leakoppenia   BPH (benign prostatic hypertrophy)    Bullous pemphigoid    [name], March 2011, right forearm squamous cell carcinoma   Chronic anticoagulation    systemic   Colon polyp    transverse, 2002   History of peptic ulcer    remote, 3/95   Hx of actinic keratosis    Hx of basal cell carcinoma    Hx of squamous cell carcinoma of skin    Hyperlipidemia    Left inguinal hernia    Moderate aortic insufficiency 2009   audible aortic insufficiency on 1/09 echo   PAF (paroxysmal atrial fibrillation) ([place]) 01/17/2014   On Warfarin.   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 Last BM 12/29 @1746  large and red. PTA warfarin (5.5 mg daily, LKD 12/28 @0730 ) for mechanical mitral valve. INR 1.7 and 1.2 on recheck (subtherapeutic (goal 2.5-3.5)). Pharmacy consulted to start IV heparin. Initial heparin level is undetectable. Heparin was held from 12/31 0200 for colonoscopy at 0800. Restarting heparin at the same rate at 1000. CBC stable.   Goal of Therapy:  Heparin level 0.3-0.5 IU/mL Monitor platelets by anticoagulation protocol: Yes   Plan:  Restart heparin 1150 units/h no bolus Follow up 8 h heparin level, and then daily heparin level and CBC CTM for s/sx of bleeding and status post surgery   Varney Daily, PharmD PGY1 Pharmacy Resident  Please check AMION for all [facility] pharmacy phone numbers After 10:00 PM call main pharmacy [phone]

## 2021-06-27 NOTE — Anesthesia Preprocedure Evaluation (Addendum)
Anesthesia Evaluation  Patient identified by MRN, date of birth, ID band Patient awake    Reviewed: Allergy & Precautions, NPO status , Patient's Chart, lab work & pertinent test results, reviewed documented beta blocker date and time   History of Anesthesia Complications Negative for: history of anesthetic complications  Airway Mallampati: II  TM Distance: >3 FB Neck ROM: Full    Dental  (+) Chipped, Dental Advisory Given, Caps   Pulmonary former smoker,    breath sounds clear to auscultation       Cardiovascular hypertension, Pt. on medications and Pt. on home beta blockers (-) angina+ dysrhythmias Atrial Fibrillation + Valvular Problems/Murmurs (s/p St Jude MVR)  Rhythm:Irregular Rate:Normal  '20 ECHO: The left ventricle has normal systolic function, EF 15-72%. The cavity size was normal. There is moderately increased left ventricular wall thickness, St Jude MV functioning well, mild-mod AI   Neuro/Psych negative neurological ROS     GI/Hepatic Neg liver ROS, hematochezia   Endo/Other    Renal/GU Renal InsufficiencyRenal disease     Musculoskeletal  (+) Arthritis , Osteoarthritis,    Abdominal   Peds  Hematology  (+) Blood dyscrasia (Hb 8.6), anemia , coumadin   Anesthesia Other Findings   Reproductive/Obstetrics                            Anesthesia Physical Anesthesia Plan  ASA: 3  Anesthesia Plan: MAC   Post-op Pain Management: Minimal or no pain anticipated   Induction:   PONV Risk Score and Plan: 1 and Treatment may vary due to age or medical condition  Airway Management Planned: Natural Airway and Simple Face Mask  Additional Equipment: None  Intra-op Plan:   Post-operative Plan:   Informed Consent: I have reviewed the patients History and Physical, chart, labs and discussed the procedure including the risks, benefits and alternatives for the proposed anesthesia with  the patient or authorized representative who has indicated his/her understanding and acceptance.   Patient has DNR.  Discussed DNR with patient and Suspend DNR.   Dental advisory given  Plan Discussed with: CRNA and Surgeon  Anesthesia Plan Comments:        Anesthesia Quick Evaluation

## 2021-06-27 NOTE — Progress Notes (Signed)
ANTICOAGULATION CONSULT NOTE  Pharmacy Consult for IV heparin  Indication: mechanical mitral valve  Allergies  Allergen Reactions   Flexeril [Cyclobenzaprine] Diarrhea    Patient Measurements: Height: 6' (182.9 cm) Weight: 68 kg (149 lb 14.6 oz) IBW/kg (Calculated) : 77.6 Heparin Dosing Weight: 68 kg   Vital Signs: Temp: 98.5 F (36.9 C) (12/31 1609) Temp Source: Oral (12/31 1609) BP: 125/68 (12/31 1609) Pulse Rate: 95 (12/31 1609)  Labs: Recent Labs    06/25/21 0523 06/25/21 1158 06/26/21 0053 06/26/21 1759 06/27/21 0044 06/27/21 1748  HGB 8.0* 8.3* 8.2*  --  8.6*  --   HCT 24.7* 25.6* 24.9*  --  26.3*  --   PLT  --   --  156  --  172  --   LABPROT 38.5*  --  19.8*  --  15.6*  --   INR 3.9*  --  1.7*  --  1.2  --   HEPARINUNFRC  --   --   --  <0.10*  --  0.44  CREATININE 1.52*  --  1.37*  --  1.39*  --      Estimated Creatinine Clearance: 31.9 mL/min (A) (by C-G formula based on SCr of 1.39 mg/dL (H)).   Medical History: Past Medical History:  Diagnosis Date   Anemia    leakoppenia   BPH (benign prostatic hypertrophy)    Bullous pemphigoid    [name], March 2011, right forearm squamous cell carcinoma   Chronic anticoagulation    systemic   Colon polyp    transverse, 2002   History of peptic ulcer    remote, 3/95   Hx of actinic keratosis    Hx of basal cell carcinoma    Hx of squamous cell carcinoma of skin    Hyperlipidemia    Left inguinal hernia    Moderate aortic insufficiency 2009   audible aortic insufficiency on 1/09 echo   PAF (paroxysmal atrial fibrillation) ([place]) 01/17/2014   On Warfarin.   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 Last BM 12/29 @1746  large and red. PTA  warfarin (5.5 mg daily, LKD 12/28 @0730 ) for mechanical mitral valve. INR 1.7 and 1.2 on recheck (subtherapeutic (goal 2.5-3.5)). Pharmacy consulted to start IV heparin.  PM heparin level therapeutic at 0.44  Goal of Therapy:  Heparin level 0.3-0.5 IU/mL Monitor platelets by anticoagulation protocol: Yes   Plan:  Continue heparin at 1150 units/h  Next level in AM  Thank you Anette Guarneri, PharmD  Please check AMION for all [facility] pharmacy phone numbers After 10:00 PM call main pharmacy [phone]

## 2021-06-28 DIAGNOSIS — D62 Acute posthemorrhagic anemia: Secondary | ICD-10-CM

## 2021-06-28 DIAGNOSIS — I10 Essential (primary) hypertension: Secondary | ICD-10-CM | POA: Diagnosis not present

## 2021-06-28 DIAGNOSIS — Z7901 Long term (current) use of anticoagulants: Secondary | ICD-10-CM

## 2021-06-28 DIAGNOSIS — K922 Gastrointestinal hemorrhage, unspecified: Secondary | ICD-10-CM | POA: Diagnosis not present

## 2021-06-28 DIAGNOSIS — Z5181 Encounter for therapeutic drug level monitoring: Secondary | ICD-10-CM | POA: Diagnosis not present

## 2021-06-28 DIAGNOSIS — D649 Anemia, unspecified: Secondary | ICD-10-CM | POA: Diagnosis not present

## 2021-06-28 DIAGNOSIS — I4811 Longstanding persistent atrial fibrillation: Secondary | ICD-10-CM | POA: Diagnosis not present

## 2021-06-28 DIAGNOSIS — Z952 Presence of prosthetic heart valve: Secondary | ICD-10-CM | POA: Diagnosis not present

## 2021-06-28 LAB — BASIC METABOLIC PANEL
Anion gap: 5 (ref 5–15)
BUN: 10 mg/dL (ref 8–23)
CO2: 22 mmol/L (ref 22–32)
Calcium: 7.9 mg/dL — ABNORMAL LOW (ref 8.9–10.3)
Chloride: 106 mmol/L (ref 98–111)
Creatinine, Ser: 1.38 mg/dL — ABNORMAL HIGH (ref 0.61–1.24)
GFR, Estimated: 48 mL/min — ABNORMAL LOW (ref >60)
Glucose, Bld: 85 mg/dL (ref 70–99)
Potassium: 4 mmol/L (ref 3.5–5.1)
Sodium: 133 mmol/L — ABNORMAL LOW (ref 135–145)

## 2021-06-28 LAB — CBC
HCT: 23.4 % — ABNORMAL LOW (ref 39.0–52.0)
Hemoglobin: 7.8 g/dL — ABNORMAL LOW (ref 13.0–17.0)
MCH: 32.1 pg (ref 26.0–34.0)
MCHC: 33.3 g/dL (ref 30.0–36.0)
MCV: 96.3 fL (ref 80.0–100.0)
Platelets: 154 10*3/uL (ref 150–400)
RBC: 2.43 MIL/uL — ABNORMAL LOW (ref 4.22–5.81)
RDW: 16.3 % — ABNORMAL HIGH (ref 11.5–15.5)
WBC: 2.8 10*3/uL — ABNORMAL LOW (ref 4.0–10.5)
nRBC: 0 % (ref 0.0–0.2)

## 2021-06-28 LAB — HEMOGLOBIN AND HEMATOCRIT, BLOOD
HCT: 27.3 % — ABNORMAL LOW (ref 39.0–52.0)
Hemoglobin: 9.3 g/dL — ABNORMAL LOW (ref 13.0–17.0)

## 2021-06-28 LAB — PREPARE RBC (CROSSMATCH)

## 2021-06-28 LAB — HEPARIN LEVEL (UNFRACTIONATED)
Heparin Unfractionated: 0.54 IU/mL (ref 0.30–0.70)
Heparin Unfractionated: 0.38 IU/mL (ref 0.30–0.70)

## 2021-06-28 MED ORDER — HEPARIN (PORCINE) 25000 UT/250ML-% IV SOLN
1100.0000 [IU]/h | INTRAVENOUS | Status: AC
  Administered 2021-06-28 (×2): 1100 [IU]/h via INTRAVENOUS
  Filled 2021-06-28: qty 250

## 2021-06-28 MED ORDER — SODIUM CHLORIDE 0.9% IV SOLUTION: Freq: Once | INTRAVENOUS | Status: AC

## 2021-06-28 NOTE — Progress Notes (Signed)
1 Day Post-Op   Subjective/Chief Complaint: No complaints Denies abdominal pain or blood per rectum   Objective: Vital signs in last 24 hours: Temp:  [97.5 F (36.4 C)-98.7 F (37.1 C)] 98.4 F (36.9 C) (01/01 0728) Pulse Rate:  [90-112] 108 (01/01 0728) Resp:  [14-19] 14 (01/01 0728) BP: (124-139)/(68-85) 131/81 (01/01 0728) SpO2:  [96 %-99 %] 97 % (01/01 0728) Last BM Date: 06/26/21  Intake/Output from previous day: 12/31 0701 - 01/01 0700 In: 509.8 [P.O.:240; I.V.:269.8] Out: 700 [Urine:700] Intake/Output this shift: No intake/output data recorded.  Exam: Awake and alert Abdomen soft, NT/ND  Lab Results:  Recent Labs    06/27/21 0044 06/28/21 0036  WBC 3.2* 2.8*  HGB 8.6* 7.8*  HCT 26.3* 23.4*  PLT 172 154   BMET Recent Labs    06/27/21 0044 06/28/21 0036  NA 133* 133*  K 4.6 4.0  CL 103 106  CO2 23 22  GLUCOSE 99 85  BUN 15 10  CREATININE 1.39* 1.38*  CALCIUM 8.3* 7.9*   PT/INR Recent Labs    06/26/21 0053 06/27/21 0044  LABPROT 19.8* 15.6*  INR 1.7* 1.2   ABG No results for input(s): PHART, HCO3 in the last 72 hours.  Invalid input(s): PCO2, PO2  Studies/Results: No results found.  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  Given that the mass in likely malignant, near obstructing, and likely to bleed again, a partial colectomy is recommended. This will be discussed with our acute care surgeon for the week, Dr. Windle Guard.  Will make NPO at midnight and hold heparin starting at 4 am just in case surgery can be performed tomorrow  Recommend pre-op cardiology evaluation ? Need for a least 1 unit PRBC's pre op  Coralie Keens 06/28/2021

## 2021-06-28 NOTE — Assessment & Plan Note (Addendum)
Anticoagulation briefly held in the setting of active GI bleeding-since GI bleeding has now resolved-he was placed back on overlapping heparin/Coumadin.  INR has slowly increased-and is now therapeutic.  Stop heparin-continue with usual dosing of Coumadin.  Please recheck INR in the next few days while at [facility].

## 2021-06-28 NOTE — Assessment & Plan Note (Addendum)
Creatinine close to baseline-watch closely

## 2021-06-28 NOTE — Assessment & Plan Note (Addendum)
Rate controlled-on beta-blocker-on Coumadin with therapeutic INR.  Continue to monitor INR levels at SNF and adjust dosing of Coumadin accordingly.  CHA2DS2-VASc of 3.

## 2021-06-28 NOTE — Assessment & Plan Note (Addendum)
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

## 2021-06-28 NOTE — Assessment & Plan Note (Addendum)
BP controlled-but slowly creeping up-resume Imdur/hydralazine-continue bisoprolol on discharge.  Optimize accordingly at SNF.

## 2021-06-28 NOTE — Assessment & Plan Note (Addendum)
Lower GI bleeding initially felt to be due to colon mass-but redeveloped bleeding postoperatively which was thought to be due to oozing from anastomotic site in the setting of anticoagulation use.anticoagulation was held-he was managed with supportive care and as needed PRBC transfusion.  Thankfully GI bleeding is completely resolved.

## 2021-06-28 NOTE — Hospital Course (Signed)
89/M from independent living with spouse with history of paroxysmal atrial fibrillation and mechanical mitral valve replacement with St. Jude's metallic valve on Coumadin, chronic anemia, hypertension, chronic diastolic CHF, presented to the ED with 2 episodes of hematochezia, without abdominal pain fevers or chills.  In the ED hemoglobin was down to 8.6, INR was 3.9.  CT angio abdomen-noted enhancing masslike filling defect within the mid transverse colon is identified. Patient was admitted and GI consulted.   Colonoscopy on 12/31 identified a near-obstructing mass in the transverse colon.    General surgery consulted and plan for surgical resection.

## 2021-06-28 NOTE — Consult Note (Signed)
CARDIOLOGY CONSULT NOTE       Patient ID: Richard Spears MRN: [id number] DOB/AGE: 1926/01/16 88 y.o.  Admit date: 06/24/2021 Referring Physician: Arbutus Ped Primary Physician: [name], MD Primary Cardiologist: Ellyn Hack  Reason for Consultation: Anticoagulation/MVD  Principal Problem:   GI bleeding Active Problems:   S/P MVR (mitral valve replacement)   Longstanding persistent atrial fibrillation: CHA2DS2-VASc Score 3   Essential hypertension   Hyponatremia   CKD (chronic kidney disease) stage 3, GFR 30-59 ml/min (HCC)   Acute blood loss anemia   HPI:  88 y.o. asked to see to help manage anticoagulation with mechanical MV.  Patient has 88 yo mechanical St Jude MVR with chronic afib on coumadin Admitted with LGI bleed and found to have colonic mass. INR 3.9 on admission with CT showing likely mid transverse colon cancer. He is getting transfusion today with Hb 7.8 Plan for surgery next 24-48 hours Echo July 2020 showed EF 55-60% with severe bi atrial enlargement from chronic afib and normal mechanical MVR no MR and mean diastolic gradient 7 mmHg INR now 1.2 on heparin drip   ROS All other systems reviewed and negative except as noted above  Past Medical History:  Diagnosis Date   Anemia    leakoppenia   BPH (benign prostatic hypertrophy)    Bullous pemphigoid    [name], March 2011, right forearm squamous cell carcinoma   Chronic anticoagulation    systemic   Colon polyp    transverse, 2002   History of peptic ulcer    remote, 3/95   Hx of actinic keratosis    Hx of basal cell carcinoma    Hx of squamous cell carcinoma of skin    Hyperlipidemia    Left inguinal hernia    Moderate aortic insufficiency 2009   audible aortic insufficiency on 1/09 echo   PAF (paroxysmal atrial fibrillation) ([place]) 01/17/2014   On Warfarin.   S/P mitral valve replacement with metallic valve 97/9892   INR goal 2.5-3.5, St Jude,    Squamous cell carcinoma in situ of skin of  right lower leg 10/15/2014   [place]    Family History  Problem Relation Age of Onset   Hypertension Mother    Lung cancer Sister    COPD Brother    Cancer Brother    Other Sister        polio   Lupus Son     Social History   Socioeconomic History   Marital status: Married    Spouse name: Not on file   Number of children: 2   Years of education: Not on file   Highest education level: Not on file  Occupational History   Occupation: retired  Tobacco Use   Smoking status: Former    Packs/day: 1.00    Years: 13.00    Pack years: 13.00    Types: Cigarettes    Start date: 1947    Quit date: 1960    Years since quitting: 63.0   Smokeless tobacco: Never  Vaping Use   Vaping Use: Never used  Substance and Sexual Activity   Alcohol use: Yes    Alcohol/week: 0.0 standard drinks    Comment: 1-2 drinks per day   Drug use: Never   Sexual activity: Not on file  Other Topics Concern   Not on file  Social History Narrative   Patient lives at [facility], With his wife - Meryl Crutch.   Social Determinants of Health   Financial Resource Strain: Not  on file  Food Insecurity: Not on file  Transportation Needs: Not on file  Physical Activity: Not on file  Stress: Not on file  Social Connections: Not on file  Intimate Partner Violence: Not on file    Past Surgical History:  Procedure Laterality Date   Electrodesiccation and Curettage and Shave Biopsy Right    Right medial, anterio tibia: Well differentiated Squamous Cell   hip replacements Left    10 years ago   MITRAL VALVE REPLACEMENT  03/1996   St. Jude mechanical valve   ORIF FEMUR FRACTURE Left 08/28/2020   Procedure: OPEN REDUCTION INTERNAL FIXATION (ORIF) DISTAL FEMUR FRACTURE;  Surgeon: Rod Can, MD;  Location: [facility];  Service: Orthopedics;  Laterality: Left;   TOTAL HIP ARTHROPLASTY Right 10/12/2017   Procedure: RIGHT TOTAL HIP ARTHROPLASTY ANTERIOR APPROACH;  Surgeon: Gaynelle Arabian, MD;  Location: [facility];   Service: Orthopedics;  Laterality: Right;   TRANSTHORACIC ECHOCARDIOGRAM  12/2018   Unable to assess diastolic function because of A. fib. Normal RV function, but moderately elevated RVSP.  Severe biatrial enlargement. S/P St Jude bileaflet mechanical MVR that appears to be functioning normally. Mitral valve regurgitation cannot assess due to mechanical valve shadowing. MV Mean grad: 7.0 mmHg MV Area (PHT): 3.38 cm (stable for valve).  Mild Ao Sclerosis, Mild-Mod AI   TRANSTHORACIC ECHOCARDIOGRAM  08/'17; 10/'18    a) Mild conc LVH. EF 55-60%. No RWMA. Mod AI. Mechanical MV prosthesis functioning properly. LAD dilation.;; b)  EF 55-60%.  Mo AI.  Bileaflet Saint Jude mechanical MV with no paravalvular leak.  Severe LA dilation.  Minimally elevated PAP      Current Facility-Administered Medications:    0.9 %  sodium chloride infusion (Manually program via Guardrails IV Fluids), , Intravenous, Once, Nicole Kindred A, DO   acetaminophen (TYLENOL) tablet 650 mg, 650 mg, Oral, Q6H PRN, 650 mg at 06/27/21 2028 **OR** acetaminophen (TYLENOL) suppository 650 mg, 650 mg, Rectal, Q6H PRN, Clarene Essex, MD   heparin ADULT infusion 100 units/mL (25000 units/297mL), 1,100 Units/hr, Intravenous, Continuous, Coralie Keens, MD   LORazepam (ATIVAN) tablet 0.5 mg, 0.5 mg, Oral, Daily PRN, Clarene Essex, MD, 0.5 mg at 06/27/21 2028   sodium chloride flush (NS) 0.9 % injection 3 mL, 3 mL, Intravenous, Q12H, Magod, Altamese Dilling, MD, 3 mL at 06/28/21 0912  sodium chloride   Intravenous Once   sodium chloride flush  3 mL Intravenous Q12H    heparin      Physical Exam: Blood pressure 131/84, pulse (!) 107, temperature 98.1 F (36.7 C), temperature source Oral, resp. rate 20, height 6' (1.829 m), weight 68 kg, SpO2 92 %.   No distress Lungs clear S1 click normal no MR murmur Abdomen soft left inguinal hernia  No edema   Labs:   Lab Results  Component Value Date   WBC 2.8 (L) 06/28/2021   HGB 7.8 (L) 06/28/2021    HCT 23.4 (L) 06/28/2021   MCV 96.3 06/28/2021   PLT 154 06/28/2021    Recent Labs  Lab 06/24/21 1220 06/25/21 0523 06/28/21 0036  NA 132*   < > 133*  K 4.1   < > 4.0  CL 102   < > 106  CO2 25   < > 22  BUN 31*   < > 10  CREATININE 1.57*   < > 1.38*  CALCIUM 8.4*   < > 7.9*  PROT 6.1*  --   --   BILITOT 0.6  --   --  ALKPHOS 44  --   --   ALT 17  --   --   AST 28  --   --   GLUCOSE 122*   < > 85   < > = values in this interval not displayed.   No results found for: CKTOTAL, CKMB, CKMBINDEX, TROPONINI No results found for: CHOL No results found for: HDL No results found for: LDLCALC No results found for: TRIG No results found for: CHOLHDL No results found for: LDLDIRECT    Radiology: CT ANGIO GI BLEED  Result Date: 06/24/2021 CLINICAL DATA:  Evaluate for lower GI tract bleed. EXAM: CTA ABDOMEN AND PELVIS WITHOUT AND WITH CONTRAST TECHNIQUE: Multidetector CT imaging of the abdomen and pelvis was performed using the standard protocol during bolus administration of intravenous contrast. Multiplanar reconstructed images and MIPs were obtained and reviewed to evaluate the vascular anatomy. CONTRAST:  69mL OMNIPAQUE IOHEXOL 350 MG/ML SOLN COMPARISON:  12/15/20 FINDINGS: VASCULAR Aorta: Normal caliber aorta without aneurysm, dissection, vasculitis or significant stenosis. Aortic atherosclerotic calcifications. None Celiac: Calcified plaque with approximately 50% stenosis at the origin of the celiac artery noted. SMA: Calcified plaque with approximately 60% stenosis at the origin of the SMA. Renals: Calcified plaque at the origin of both renal arteries results in greater than 50% stenosis bilaterally. IMA: Appears patent with greater than 50% stenosis at the origin. Inflow: Patent without evidence of aneurysm, dissection, vasculitis or significant stenosis. Proximal Outflow: Bilateral common femoral and visualized portions of the superficial and profunda femoral arteries are patent. There  is non flow limiting dissection involving the right external iliac artery, image 601 through image 663/11. Veins: No obvious venous abnormality within the limitations of this arterial phase study. Review of the MIP images confirms the above findings. NON-VASCULAR Lower chest: Cardiac enlargement.  Lung bases are clear. Hepatobiliary: No acute abnormality. Low-density structure within segment 8 of the liver is technically too small to characterize measuring 7 mm, image 29/17. Gallbladder appears normal. No bile duct dilatation. Pancreas: Unremarkable. No pancreatic ductal dilatation or surrounding inflammatory changes. Spleen: Normal in size without focal abnormality. Adrenals/Urinary Tract: Normal adrenal glands. No kidney stones or obstructive uropathy identified. Several subcentimeter low-density foci are identified within the right renal cortex which are technically too small to characterize measuring less than 1 cm. Urinary bladder is largely obscured by beam hardening artifact from patient's bilateral hip arthroplasty devices. Stomach/Bowel: Within the mid transverse colon there is a masslike intraluminal filling defect with avid arterial phase enhancement measuring 4.1 by 3.7 by 3.5 cm, image 42/13 and image 90/5. No additional abnormal areas of intraluminal contrast enhancement identified. No bowel wall thickening, inflammation or distension. Lymphatic: No enlarged lymph nodes. Reproductive: Prostate gland is largely obscured by streak artifact from bilateral hip arthroplasty devices. Other: Large left inguinal hernia contains a nonobstructed loop of large bowel. Musculoskeletal: Status post bilateral hip arthroplasty. Scoliosis and degenerative disc disease is identified. IMPRESSION: 1. Avid arterial phase enhancing masslike filling defect within the mid transverse colon is identified. Although conceivably this could represent a large blood clot the diagnosis of exclusion is a primary colonic neoplasm.  Further evaluation with colonoscopy is recommended. 2. Extensive atherosclerotic disease noted with stenosis noted at the origin of the celiac artery, SMA, IMA and both renal arteries. 3. 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 4. Large left inguinal hernia contains a nonobstructed loop of large bowel. 5. Aortic Atherosclerosis (ICD10-I70.0). These results were called by telephone at the time of interpretation on 06/24/2021  at 9:47 pm to provider Carlisle Cater , who verbally acknowledged these results. Electronically Signed   By: Kerby Moors M.D.   On: 06/24/2021 21:48    EKG: afib rate 77 no acute ST changes [id number]   ASSESSMENT AND PLAN:   AFib:  rate control is fine and afib is chornic MVR:  normal function on exam and by echo 2020 will update while off anticoagulation  Anticoagulation:  D/c heparin 4 am morning of surgery resume coumadin/heparin ? 48 hours after surgery depending on clinical course and Hb.  Anemia:  transfuse today likely will need more blood post operatively   Signed: Jenkins Rouge 06/28/2021, 12:35 PM

## 2021-06-28 NOTE — Progress Notes (Addendum)
°  Progress Note   Patient: Richard Spears GBT:[id number] DOB: 1926-01-26 DOA: 06/24/2021     4 DOS: the patient was seen and examined on 06/28/2021   Brief hospital course: 89/M from independent living with spouse with history of paroxysmal atrial fibrillation and mechanical mitral valve replacement with St. Jude's metallic valve on Coumadin, chronic anemia, hypertension, chronic diastolic CHF, presented to the ED with 2 episodes of hematochezia, without abdominal pain fevers or chills.  In the ED hemoglobin was down to 8.6, INR was 3.9.  CT angio abdomen-noted enhancing masslike filling defect within the mid transverse colon is identified. Patient was admitted and GI consulted.   Colonoscopy on 12/31 identified a near-obstructing mass in the transverse colon.    General surgery consulted and plan for surgical resection.  Assessment and Plan * GI bleeding- (present on admission) Due to a mass in transverse colon identified during colonoscopy on 06/27/21. --GI, General surgery consulted --Plan for surgical resection Mon or Tues --Cardiology consulted for pre-op clearance  Acute blood loss anemia- (present on admission) Presented with GI bleeding and found with transverse colon mass on colonoscopy; chronically on coumadin for mechanical MVR. Hbg 7.8 today - Transfuse 1 unit pRBC's in anticipate for surgical resection in next day or two - Follow CBC's  CKD (chronic kidney disease) stage 3, GFR 30-59 ml/min (HCC)- (present on admission) Renal function stable, near baseline. -Monitor BMP  Hyponatremia- (present on admission) Mild, Na 133 -Monitor BMP  Essential hypertension- (present on admission) BP's controlled, currently off antihypertensives. -Monitor, resume meds when indicated  Longstanding persistent atrial fibrillation: CHA2DS2-VASc Score 3- (present on admission) Monitor on telemetry. Coumadin on hold. Continue heparin, hold pre-op per surgery recommendations  S/P MVR  (mitral valve replacement) On Coumadin which is held. Continue heparin. Follow INR's when coumadin resumed.     Subjective: Pt reports feeling overall well.  No bleeding seen since bowel prep.  Tolerating clear liquids.  Hopes to have surgery tomorrow. Agreeable to blood transfusion.  Objective Vital reviewed, notable for HR in low 100's, BP stable   Data Reviewed: Labs and data reviewed, notable for: Na 133, Cr 1.38 (stable), Hbg 7.8 (from 8.6)  Family Communication:   Disposition: Status is: Inpatient  Remains inpatient appropriate because: requiring blood transfusion today, going for surgery in 1-2 days for resection of likely malignant mass.         Time spent: 30 minutes  Author: Ezekiel Slocumb 06/28/2021 12:12 PM  For on call review [email or web address].

## 2021-06-28 NOTE — Assessment & Plan Note (Deleted)
Presented with GI bleeding and found with transverse colon mass on colonoscopy; chronically on coumadin for mechanical MVR. Hbg 7.8 on 1/1, transfused 1 unit pRBC's Hbg 9.3 post-transfusion >> 8.8 >> 9.7 this AM No further bleeding has been noted. --Follow CBC's

## 2021-06-28 NOTE — Progress Notes (Addendum)
ANTICOAGULATION CONSULT NOTE  Pharmacy Consult for IV heparin  Indication: mechanical mitral valve  Allergies  Allergen Reactions   Flexeril [Cyclobenzaprine] Diarrhea    Patient Measurements: Height: 6' (182.9 cm) Weight: 68 kg (149 lb 14.6 oz) IBW/kg (Calculated) : 77.6 Heparin Dosing Weight: 68 kg   Vital Signs: Temp: 98.5 F (36.9 C) (01/01 2000) Temp Source: Oral (01/01 2000) BP: 137/110 (01/01 2000) Pulse Rate: 108 (01/01 2000)  Labs: Recent Labs    06/26/21 0053 06/26/21 1759 06/27/21 0044 06/27/21 1748 06/28/21 0036 06/28/21 2015 06/28/21 2129  HGB 8.2*  --  8.6*  --  7.8* 9.3*  --   HCT 24.9*  --  26.3*  --  23.4* 27.3*  --   PLT 156  --  172  --  154  --   --   LABPROT 19.8*  --  15.6*  --   --   --   --   INR 1.7*  --  1.2  --   --   --   --   HEPARINUNFRC  --    < >  --  0.44 0.54  --  0.38  CREATININE 1.37*  --  1.39*  --  1.38*  --   --    < > = values in this interval not displayed.     Estimated Creatinine Clearance: 32.2 mL/min (A) (by C-G formula based on SCr of 1.38 mg/dL (H)).   Medical History: Past Medical History:  Diagnosis Date   Anemia    leakoppenia   BPH (benign prostatic hypertrophy)    Bullous pemphigoid    [name], March 2011, right forearm squamous cell carcinoma   Chronic anticoagulation    systemic   Colon polyp    transverse, 2002   History of peptic ulcer    remote, 3/95   Hx of actinic keratosis    Hx of basal cell carcinoma    Hx of squamous cell carcinoma of skin    Hyperlipidemia    Left inguinal hernia    Moderate aortic insufficiency 2009   audible aortic insufficiency on 1/09 echo   PAF (paroxysmal atrial fibrillation) ([place]) 01/17/2014   On Warfarin.   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 Last BM 12/29 @1746  large and red. PTA warfarin (5.5 mg daily, LKD 12/28 @0730 ) for mechanical mitral valve. INR 1.7 and 1.2 on recheck (subtherapeutic (goal 2.5-3.5)). Pharmacy consulted to start IV heparin.  PM heparin level therapeutic at 0.38. Per surgery note and nursing, will set heparin infusion to stop at 0400 tomorrow morning for possible surgery. Will follow plans to resume heparin/warfarin after surgery. CBC stable since transfusion.   Goal of Therapy:  Heparin level 0.3-0.5 IU/mL Monitor platelets by anticoagulation protocol: Yes   Plan:  Continue heparin at 1100 units/h and stop at 0400 Will omit AM heparin level and reorder when heparin is restarted  Thank you for allowing pharmacy to participate in this patient's care.  Reatha Harps, PharmD PGY1 Pharmacy Resident 06/28/2021 10:09 PM Check [email or web address] for unit specific pharmacy number

## 2021-06-28 NOTE — Progress Notes (Signed)
[place] for IV heparin  Indication: mechanical mitral valve  Allergies  Allergen Reactions   Flexeril [Cyclobenzaprine] Diarrhea    Patient Measurements: Height: 6' (182.9 cm) Weight: 68 kg (149 lb 14.6 oz) IBW/kg (Calculated) : 77.6 Heparin Dosing Weight: 68 kg   Vital Signs: Temp: 98.4 F (36.9 C) (01/01 0728) Temp Source: Oral (01/01 0728) BP: 131/81 (01/01 0728) Pulse Rate: 108 (01/01 0728)  Labs: Recent Labs    06/26/21 0053 06/26/21 1759 06/27/21 0044 06/27/21 1748 06/28/21 0036  HGB 8.2*  --  8.6*  --  7.8*  HCT 24.9*  --  26.3*  --  23.4*  PLT 156  --  172  --  154  LABPROT 19.8*  --  15.6*  --   --   INR 1.7*  --  1.2  --   --   HEPARINUNFRC  --  <0.10*  --  0.44 0.54  CREATININE 1.37*  --  1.39*  --  1.38*     Estimated Creatinine Clearance: 32.2 mL/min (A) (by C-G formula based on SCr of 1.38 mg/dL (H)).   Medical History: Past Medical History:  Diagnosis Date   Anemia    leakoppenia   BPH (benign prostatic hypertrophy)    Bullous pemphigoid    [name], March 2011, right forearm squamous cell carcinoma   Chronic anticoagulation    systemic   Colon polyp    transverse, 2002   History of peptic ulcer    remote, 3/95   Hx of actinic keratosis    Hx of basal cell carcinoma    Hx of squamous cell carcinoma of skin    Hyperlipidemia    Left inguinal hernia    Moderate aortic insufficiency 2009   audible aortic insufficiency on 1/09 echo   PAF (paroxysmal atrial fibrillation) ([place]) 01/17/2014   On Warfarin.   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 Last BM 12/29 @1746  large and red. PTA warfarin (5.5 mg daily, LKD 12/28 @0730 ) for mechanical  mitral valve. INR 1.7 and 1.2 on recheck (subtherapeutic (goal 2.5-3.5)). Pharmacy consulted to start IV heparin.  PM heparin level therapeutic at 0.44 and AM heparin level slightly supratherapeutic at 0.54.  Goal of Therapy:  Heparin level 0.3-0.5 IU/mL Monitor platelets by anticoagulation protocol: Yes   Plan:  Decrease heparin to 1100 units/h  Next heparin level at 1700  Thank you, Varney Daily, PharmD PGY1 Pharmacy Resident  Please check AMION for all [facility] pharmacy phone numbers After 10:00 PM call main pharmacy [phone]

## 2021-06-29 ENCOUNTER — Encounter (HOSPITAL_COMMUNITY): Payer: Self-pay | Admitting: Gastroenterology

## 2021-06-29 DIAGNOSIS — Z952 Presence of prosthetic heart valve: Secondary | ICD-10-CM | POA: Diagnosis not present

## 2021-06-29 DIAGNOSIS — I4811 Longstanding persistent atrial fibrillation: Secondary | ICD-10-CM | POA: Diagnosis not present

## 2021-06-29 DIAGNOSIS — K922 Gastrointestinal hemorrhage, unspecified: Secondary | ICD-10-CM | POA: Diagnosis not present

## 2021-06-29 DIAGNOSIS — D62 Acute posthemorrhagic anemia: Secondary | ICD-10-CM | POA: Diagnosis not present

## 2021-06-29 LAB — CBC
HCT: 27.4 % — ABNORMAL LOW (ref 39.0–52.0)
Hemoglobin: 8.8 g/dL — ABNORMAL LOW (ref 13.0–17.0)
MCH: 30.4 pg (ref 26.0–34.0)
MCHC: 32.1 g/dL (ref 30.0–36.0)
MCV: 94.8 fL (ref 80.0–100.0)
Platelets: 160 10*3/uL (ref 150–400)
RBC: 2.89 MIL/uL — ABNORMAL LOW (ref 4.22–5.81)
RDW: 18.2 % — ABNORMAL HIGH (ref 11.5–15.5)
WBC: 3.1 10*3/uL — ABNORMAL LOW (ref 4.0–10.5)
nRBC: 0 % (ref 0.0–0.2)

## 2021-06-29 LAB — BPAM RBC
Blood Product Expiration Date: 202301202359
ISSUE DATE / TIME: 202301011417
Unit Type and Rh: 5100

## 2021-06-29 LAB — TYPE AND SCREEN
ABO/RH(D): O POS
Antibody Screen: NEGATIVE
Unit division: 0

## 2021-06-29 LAB — BASIC METABOLIC PANEL
Anion gap: 9 (ref 5–15)
BUN: 10 mg/dL (ref 8–23)
CO2: 21 mmol/L — ABNORMAL LOW (ref 22–32)
Calcium: 8 mg/dL — ABNORMAL LOW (ref 8.9–10.3)
Chloride: 105 mmol/L (ref 98–111)
Creatinine, Ser: 1.32 mg/dL — ABNORMAL HIGH (ref 0.61–1.24)
GFR, Estimated: 50 mL/min — ABNORMAL LOW (ref >60)
Glucose, Bld: 88 mg/dL (ref 70–99)
Potassium: 4.1 mmol/L (ref 3.5–5.1)
Sodium: 135 mmol/L (ref 135–145)

## 2021-06-29 LAB — MAGNESIUM: Magnesium: 1.9 mg/dL (ref 1.7–2.4)

## 2021-06-29 LAB — HEPARIN LEVEL (UNFRACTIONATED): Heparin Unfractionated: 0.18 IU/mL — ABNORMAL LOW (ref 0.30–0.70)

## 2021-06-29 MED ORDER — HEPARIN (PORCINE) 25000 UT/250ML-% IV SOLN
1200.0000 [IU]/h | INTRAVENOUS | Status: DC
  Administered 2021-06-29: 1100 [IU]/h via INTRAVENOUS
  Filled 2021-06-29: qty 250

## 2021-06-29 NOTE — H&P (View-Only) (Signed)
2 Days Post-Op   Subjective/Chief Complaint: No complaints Denies abdominal pain or blood per rectum   Objective: Vital signs in last 24 hours: Temp:  [97.4 F (36.3 C)-98.6 F (37 C)] 97.4 F (36.3 C) (01/02 0733) Pulse Rate:  [78-108] 78 (01/02 0733) Resp:  [12-20] 13 (01/02 0733) BP: (118-144)/(68-110) 138/71 (01/02 0733) SpO2:  [92 %-97 %] 97 % (01/02 0400) Last BM Date: 06/26/21  Intake/Output from previous day: 01/01 0701 - 01/02 0700 In: 530.2 [P.O.:360; I.V.:170.2] Out: 1135 [Urine:1135] Intake/Output this shift: Total I/O In: -  Out: 200 [Urine:200]  Exam: Awake and alert Abdomen soft, NT/ND  Lab Results:  Recent Labs    06/28/21 0036 06/28/21 2015 06/29/21 0255  WBC 2.8*  --  3.1*  HGB 7.8* 9.3* 8.8*  HCT 23.4* 27.3* 27.4*  PLT 154  --  160    BMET Recent Labs    06/28/21 0036 06/29/21 0255  NA 133* 135  K 4.0 4.1  CL 106 105  CO2 22 21*  GLUCOSE 85 88  BUN 10 10  CREATININE 1.38* 1.32*  CALCIUM 7.9* 8.0*    PT/INR Recent Labs    06/27/21 0044  LABPROT 15.6*  INR 1.2    ABG No results for input(s): PHART, HCO3 in the last 72 hours.  Invalid input(s): PCO2, PO2  Studies/Results: No results found.  Anti-infectives: Anti-infectives (From admission, onward)    None       Assessment/Plan: Transverse colon mass, likely cancer-Path is pending Heart valve and Afib on chronic anticoag meds  I again discussed the current situation with the patient.  Given that the mass in likely malignant, near obstructing, and likely to bleed again, a partial colectomy is recommended.  We will plan for surgery tomorrow, as today is a "holiday schedule" and the OR is only allowing for emergent cases to be done.  Will make NPO at midnight and hold heparin starting at 4 am.   Appreciate cardiology recommendations.  Patient to have repeat echo today.  Juliane Guest A Kae Heller 06/29/2021

## 2021-06-29 NOTE — Progress Notes (Signed)
ANTICOAGULATION CONSULT NOTE  Pharmacy Consult for Heparin Indication:  mechanical mitral valve and atrial fibrillation  Allergies  Allergen Reactions   Flexeril [Cyclobenzaprine] Diarrhea    Patient Measurements: Height: 6' (182.9 cm) Weight: 68 kg (149 lb 14.6 oz) IBW/kg (Calculated) : 77.6  Heparin Dosing Weight: 68 kg  Vital Signs: Temp: 97.4 F (36.3 C) (01/02 0733) Temp Source: Oral (01/02 0733) BP: 138/71 (01/02 0733) Pulse Rate: 78 (01/02 0733)  Labs: Recent Labs    06/27/21 0044 06/27/21 1748 06/28/21 0036 06/28/21 2015 06/28/21 2129 06/29/21 0255  HGB 8.6*  --  7.8* 9.3*  --  8.8*  HCT 26.3*  --  23.4* 27.3*  --  27.4*  PLT 172  --  154  --   --  160  LABPROT 15.6*  --   --   --   --   --   INR 1.2  --   --   --   --   --   HEPARINUNFRC  --  0.44 0.54  --  0.38  --   CREATININE 1.39*  --  1.38*  --   --  1.32*    Estimated Creatinine Clearance: 33.6 mL/min (A) (by C-G formula based on SCr of 1.32 mg/dL (H)).   Assessment: 88 yo M admitted with GIB on 12/28. PTA warfarin (LD 12/28 @ 0730) for mechanical mitral valve and atrial fibrillation. INR 3.9 > IV vitamin k 5mg  12/29 > INR 1.2 on recheck. Pharmacy consulted to start IV heparin while warfarin is on hold.  PTA warfarin 5.5mg  daily, INR goal 2.5-3.5    Per surgery note and nursing, heparin infusion stopped at 0400 for possible surgery. Surgery postponed due to scheduling. Will resume heparin infusion today at previous rate and set heparin to stop at 0400 on 01/03 for pending procedure. CBC stable, plts wnl    Goal of Therapy:  Heparin level 0.3-0.5 units/ml Monitor platelets by anticoagulation protocol: Yes   Plan:  Resume heparin infusion at 1100  units/hr Check heparin level in 8 hours and daily while on heparin Continue to monitor H&H and platelets Hold heparin infusion tomorrow (01/03) at 4 AM for anticipated surgery    Thank you for allowing pharmacy to be a part of this patients  care.  Ardyth Harps, PharmD Clinical Pharmacist

## 2021-06-29 NOTE — Progress Notes (Signed)
2 Days Post-Op   Subjective/Chief Complaint: No complaints Denies abdominal pain or blood per rectum   Objective: Vital signs in last 24 hours: Temp:  [97.4 F (36.3 C)-98.6 F (37 C)] 97.4 F (36.3 C) (01/02 0733) Pulse Rate:  [78-108] 78 (01/02 0733) Resp:  [12-20] 13 (01/02 0733) BP: (118-144)/(68-110) 138/71 (01/02 0733) SpO2:  [92 %-97 %] 97 % (01/02 0400) Last BM Date: 06/26/21  Intake/Output from previous day: 01/01 0701 - 01/02 0700 In: 530.2 [P.O.:360; I.V.:170.2] Out: 1135 [Urine:1135] Intake/Output this shift: Total I/O In: -  Out: 200 [Urine:200]  Exam: Awake and alert Abdomen soft, NT/ND  Lab Results:  Recent Labs    06/28/21 0036 06/28/21 2015 06/29/21 0255  WBC 2.8*  --  3.1*  HGB 7.8* 9.3* 8.8*  HCT 23.4* 27.3* 27.4*  PLT 154  --  160    BMET Recent Labs    06/28/21 0036 06/29/21 0255  NA 133* 135  K 4.0 4.1  CL 106 105  CO2 22 21*  GLUCOSE 85 88  BUN 10 10  CREATININE 1.38* 1.32*  CALCIUM 7.9* 8.0*    PT/INR Recent Labs    06/27/21 0044  LABPROT 15.6*  INR 1.2    ABG No results for input(s): PHART, HCO3 in the last 72 hours.  Invalid input(s): PCO2, PO2  Studies/Results: No results found.  Anti-infectives: Anti-infectives (From admission, onward)    None       Assessment/Plan: Transverse colon mass, likely cancer-Path is pending Heart valve and Afib on chronic anticoag meds  I again discussed the current situation with the patient.  Given that the mass in likely malignant, near obstructing, and likely to bleed again, a partial colectomy is recommended.  We will plan for surgery tomorrow, as today is a "holiday schedule" and the OR is only allowing for emergent cases to be done.  Will make NPO at midnight and hold heparin starting at 4 am.   Appreciate cardiology recommendations.  Patient to have repeat echo today.  Samhita Kretsch A Kae Heller 06/29/2021

## 2021-06-29 NOTE — Anesthesia Preprocedure Evaluation (Signed)
Anesthesia Evaluation    Reviewed: Allergy & Precautions, Patient's Chart, lab work & pertinent test results, reviewed documented beta blocker date and time   History of Anesthesia Complications Negative for: history of anesthetic complications  Airway Mallampati: II  TM Distance: >3 FB Neck ROM: Full    Dental  (+) Chipped, Dental Advisory Given, Caps   Pulmonary former smoker,    breath sounds clear to auscultation       Cardiovascular hypertension, Pt. on medications and Pt. on home beta blockers (-) angina+ dysrhythmias Atrial Fibrillation + Valvular Problems/Murmurs (s/p St Jude MVR) AI  Rhythm:Irregular Rate:Normal  '20 ECHO: The left ventricle has normal systolic function, EF 50-27%. The cavity size was normal. There is moderately increased left ventricular wall thickness, St Jude MV functioning well, mild-mod AI   Neuro/Psych negative neurological ROS     GI/Hepatic Neg liver ROS, hematochezia   Endo/Other    Renal/GU Renal InsufficiencyRenal disease     Musculoskeletal  (+) Arthritis , Osteoarthritis,    Abdominal   Peds  Hematology  (+) Blood dyscrasia (Hb 8.6), anemia , coumadin   Anesthesia Other Findings   Reproductive/Obstetrics                             Anesthesia Physical  Anesthesia Plan  ASA: 4  Anesthesia Plan: General   Post-op Pain Management: Ofirmev IV (intra-op) and Lidocaine infusion   Induction: Intravenous  PONV Risk Score and Plan: 2 and Treatment may vary due to age or medical condition, Ondansetron and Dexamethasone  Airway Management Planned: Natural Airway and Simple Face Mask  Additional Equipment: None  Intra-op Plan:   Post-operative Plan: Extubation in OR  Informed Consent: I have reviewed the patients History and Physical, chart, labs and discussed the procedure including the risks, benefits and alternatives for the proposed anesthesia  with the patient or authorized representative who has indicated his/her understanding and acceptance.   Patient has DNR.  Discussed DNR with patient and Suspend DNR.   Dental advisory given  Plan Discussed with: CRNA and Surgeon  Anesthesia Plan Comments: (  )        Anesthesia Quick Evaluation

## 2021-06-29 NOTE — Progress Notes (Addendum)
ANTICOAGULATION CONSULT NOTE - Follow Up Consult  Pharmacy Consult for IV Heparin Indication:  mechanical mitral valve and atrial fibrillation  Allergies  Allergen Reactions   Flexeril [Cyclobenzaprine] Diarrhea    Patient Measurements: Height: 6' (182.9 cm) Weight: 68 kg (149 lb 14.6 oz) IBW/kg (Calculated) : 77.6 Heparin Dosing Weight: 68 kg  Vital Signs: Temp: 98.4 F (36.9 C) (01/02 1618) Temp Source: Oral (01/02 1618) BP: 137/73 (01/02 1618) Pulse Rate: 90 (01/02 1618)  Labs: Recent Labs    06/27/21 0044 06/27/21 1748 06/28/21 0036 06/28/21 2015 06/28/21 2129 06/29/21 0255 06/29/21 1844  HGB 8.6*  --  7.8* 9.3*  --  8.8*  --   HCT 26.3*  --  23.4* 27.3*  --  27.4*  --   PLT 172  --  154  --   --  160  --   LABPROT 15.6*  --   --   --   --   --   --   INR 1.2  --   --   --   --   --   --   HEPARINUNFRC  --    < > 0.54  --  0.38  --  0.18*  CREATININE 1.39*  --  1.38*  --   --  1.32*  --    < > = values in this interval not displayed.    Estimated Creatinine Clearance: 33.6 mL/min (A) (by C-G formula based on SCr of 1.32 mg/dL (H)).   Assessment: 88 yr old man admitted on 06/24/21 with GI bleed, transverse colon mass, likely malignant (path pending). PTA, pt was taking warfarin (last dose12/28 @ 0730) for mechanical mitral valve and atrial fibrillation. INR 3.9 > IV vitamin K 5 mg IV on 12/29 > INR 1.2 on recheck. Pharmacy was consulted to start IV heparin while warfarin is on hold.  PTA warfarin regimen: 5.5 mg daily, INR goal 2.5-3.5    Per surgery note and nursing, heparin infusion was stopped at 0400 for possible surgery, which was postponed due to scheduling. Heparin infusion was resumed today at previous rate (1100 units/hr), and heparin infusion was set to stop at 0400 on 01/03 for pending procedure (partial colectomy).  Heparin level ~8 hrs after heparin was resumed at 1100 units/hr today was 0.18 units/ml, which is below the goal range for this pt. CBC  stable, plts WNL. Per RN, no issues with IV or bleeding observed.  Goal of Therapy:  Heparin level 0.3-0.5 units/ml Monitor platelets by anticoagulation protocol: Yes   Plan:  Increase heparin infusion to 1200 units/hr Check heparin level in 8 hrs Monitor daily heparin level, CBC Hold heparin infusion tomorrow (01/03) at 4 AM for anticipated surgery F/U after surgery tomorrow, 06/30/21  Thank you for allowing pharmacy to be a part of this patients care.  Gillermina Hu, PharmD, BCPS, [facility] Clinical Pharmacist

## 2021-06-29 NOTE — Plan of Care (Signed)

## 2021-06-29 NOTE — Progress Notes (Signed)
°  Progress Note   Patient: Richard Spears AST:[id number] DOB: [birth year] DOA: 06/24/2021     5 DOS: the patient was seen and examined on 06/29/2021   Brief hospital course: 89/M from independent living with spouse with history of paroxysmal atrial fibrillation and mechanical mitral valve replacement with St. Jude's metallic valve on Coumadin, chronic anemia, hypertension, chronic diastolic CHF, presented to the ED with 2 episodes of hematochezia, without abdominal pain fevers or chills.  In the ED hemoglobin was down to 8.6, INR was 3.9.  CT angio abdomen-noted enhancing masslike filling defect within the mid transverse colon is identified. Patient was admitted and GI consulted.   Colonoscopy on 12/31 identified a near-obstructing mass in the transverse colon.    General surgery consulted and plan for surgical resection.  Assessment and Plan * GI bleeding- (present on admission) Due to a mass in transverse colon identified during colonoscopy on 06/27/21. --GI, General surgery consulted --Plan for surgical resection tomorrow --Cardiology consulted for pre-op clearance --Clear liquids, NPO after midnight  Acute blood loss anemia- (present on admission) Presented with GI bleeding and found with transverse colon mass on colonoscopy; chronically on coumadin for mechanical MVR. Hbg 7.8 on 1/1, transfused 1 unit pRBC's Hbg 9.3 post-transfusion >> 8.8 this AM No further bleeding has been noted. --Follow CBC's  CKD (chronic kidney disease) stage 3, GFR 30-59 ml/min (HCC)- (present on admission) Renal function stable, near baseline. -Monitor BMP  Hyponatremia- (present on admission) Mild, Na 133>> 135. Resolved. -Monitor BMP  Essential hypertension- (present on admission) BP's controlled, currently off antihypertensives. -Monitor, resume meds when indicated  Longstanding persistent atrial fibrillation: CHA2DS2-VASc Score 3- (present on admission) Monitor on telemetry. Coumadin on  hold. Continue heparin, hold pre-op per surgery recommendations  S/P MVR (mitral valve replacement) On Coumadin which is held. Continue heparin. Follow INR's when coumadin resumed.     Subjective: Pt awake resting in bed this AM.  Tolerating clear liquids.  Disappointed that surgery not planned until tomorrow, had hoped for today. Denies any bleeding, abdominal pain or other acute complaints.  Objective Vital signs were reviewed and unremarkable.  General exam: awake, alert, no acute distress, frail Respiratory system: normal respiratory effort, on room air. Cardiovascular system: RRR, no pedal edema.   Gastrointestinal system: soft, NT Central nervous system: A&O x3. no gross focal neurologic deficits, normal speech Extremities: moves all, no edema, normal tone Skin: dry, intact, pale Psychiatry: normal mood, congruent affect, judgement and insight appear normal   Data Reviewed: Labs notable for Cr 1.38>>1.32, Na 133>>135, Hbg 7.8>>9.3>>8.8,   Family Communication:   Disposition: Status is: Inpatient  Remains inpatient appropriate because: plan for surgery tomorrow         Time spent: 30 minutes  Author: Ezekiel Slocumb 06/29/2021 4:31 PM  For on call review [email or web address].

## 2021-06-29 NOTE — Progress Notes (Signed)
[facility] night cross cover note:  I was notified by RN that telemetry suggestive of 3 beat run of nonsustained V. tach.  Patient asymptomatic and normotensive.  Subsequently, has been in sinus rhythm. Will continue to monitor on telemetry.    Babs Bertin, DO Hospitalist

## 2021-06-29 NOTE — Progress Notes (Signed)
° ° °  Subjective:  Denies SSCP, palpitations or Dyspnea ? Surgery tomorrow   Objective:  Vitals:   06/29/21 0000 06/29/21 0400 06/29/21 0422 06/29/21 0733  BP: 137/80 (!) 144/90  138/71  Pulse: 93 98 89 78  Resp: 12 20  13   Temp: 97.7 F (36.5 C) 98.6 F (37 C)  (!) 97.4 F (36.3 C)  TempSrc: Oral Oral  Oral  SpO2: 97% 97%    Weight:      Height:        Intake/Output from previous day:  Intake/Output Summary (Last 24 hours) at 06/29/2021 0839 Last data filed at 06/29/2021 0400 Gross per 24 hour  Intake 530.21 ml  Output 1135 ml  Net -604.79 ml    Physical Exam: Thin white male S1 click no MR murmur Abdomen soft BS positive No edema Palpable pedal pulses   Lab Results: Basic Metabolic Panel: Recent Labs    06/28/21 0036 06/29/21 0255  NA 133* 135  K 4.0 4.1  CL 106 105  CO2 22 21*  GLUCOSE 85 88  BUN 10 10  CREATININE 1.38* 1.32*  CALCIUM 7.9* 8.0*  MG  --  1.9   Liver Function Tests: No results for input(s): AST, ALT, ALKPHOS, BILITOT, PROT, ALBUMIN in the last 72 hours. No results for input(s): LIPASE, AMYLASE in the last 72 hours. CBC: Recent Labs    06/28/21 0036 06/28/21 2015 06/29/21 0255  WBC 2.8*  --  3.1*  HGB 7.8* 9.3* 8.8*  HCT 23.4* 27.3* 27.4*  MCV 96.3  --  94.8  PLT 154  --  160    Imaging: No results found.  Cardiac Studies:  ECG: afib chronic    Telemetry:  afib   Echo: pending   Medications:    sodium chloride flush  3 mL Intravenous Q12H      Assessment/Plan:   Afib:  chronic rate control fine coumadin held for surgery On heparin hold 4 am morning of surgery MVR:  88 yo St Jude mechanical valve normal valve clicks on exam TTE today to get baseline while off anticoagulation GI bleed:  lower with CTA showing likely colon CA in transverse colon for surgery in am Resume coumadin per surgery post op when Hb stable and wounds healing Post transfusion x 1 Hb 9.3-> 8.8 will likely need more blood post op  Jenkins Rouge 06/29/2021, 8:39 AM

## 2021-06-30 ENCOUNTER — Inpatient Hospital Stay (HOSPITAL_COMMUNITY): Payer: Medicare Other | Admitting: Anesthesiology

## 2021-06-30 ENCOUNTER — Encounter (HOSPITAL_COMMUNITY)
Admission: EM | Disposition: A | Discharge status: Skilled Nursing Facility | Payer: Self-pay | Source: Skilled Nursing Facility | Attending: Internal Medicine

## 2021-06-30 DIAGNOSIS — I10 Essential (primary) hypertension: Secondary | ICD-10-CM | POA: Diagnosis not present

## 2021-06-30 DIAGNOSIS — Z952 Presence of prosthetic heart valve: Secondary | ICD-10-CM | POA: Diagnosis not present

## 2021-06-30 DIAGNOSIS — D62 Acute posthemorrhagic anemia: Secondary | ICD-10-CM | POA: Diagnosis not present

## 2021-06-30 DIAGNOSIS — K922 Gastrointestinal hemorrhage, unspecified: Secondary | ICD-10-CM | POA: Diagnosis not present

## 2021-06-30 HISTORY — PX: PARTIAL COLECTOMY: SHX5273

## 2021-06-30 LAB — CBC
HCT: 28.8 % — ABNORMAL LOW (ref 39.0–52.0)
Hemoglobin: 9.7 g/dL — ABNORMAL LOW (ref 13.0–17.0)
MCH: 31.6 pg (ref 26.0–34.0)
MCHC: 33.7 g/dL (ref 30.0–36.0)
MCV: 93.8 fL (ref 80.0–100.0)
Platelets: 160 10*3/uL (ref 150–400)
RBC: 3.07 MIL/uL — ABNORMAL LOW (ref 4.22–5.81)
RDW: 17.8 % — ABNORMAL HIGH (ref 11.5–15.5)
WBC: 3.4 10*3/uL — ABNORMAL LOW (ref 4.0–10.5)
nRBC: 0 % (ref 0.0–0.2)

## 2021-06-30 LAB — HEMOGLOBIN AND HEMATOCRIT, BLOOD
HCT: 34.2 % — ABNORMAL LOW (ref 39.0–52.0)
Hemoglobin: 11.1 g/dL — ABNORMAL LOW (ref 13.0–17.0)

## 2021-06-30 LAB — BASIC METABOLIC PANEL
Anion gap: 8 (ref 5–15)
BUN: 10 mg/dL (ref 8–23)
CO2: 21 mmol/L — ABNORMAL LOW (ref 22–32)
Calcium: 8.3 mg/dL — ABNORMAL LOW (ref 8.9–10.3)
Chloride: 105 mmol/L (ref 98–111)
Creatinine, Ser: 1.41 mg/dL — ABNORMAL HIGH (ref 0.61–1.24)
GFR, Estimated: 46 mL/min — ABNORMAL LOW (ref >60)
Glucose, Bld: 84 mg/dL (ref 70–99)
Potassium: 4.3 mmol/L (ref 3.5–5.1)
Sodium: 134 mmol/L — ABNORMAL LOW (ref 135–145)

## 2021-06-30 LAB — CEA: CEA: 4 ng/mL (ref 0.0–4.7)

## 2021-06-30 LAB — HEPARIN LEVEL (UNFRACTIONATED): Heparin Unfractionated: 0.36 IU/mL (ref 0.30–0.70)

## 2021-06-30 SURGERY — COLECTOMY, PARTIAL
Anesthesia: General

## 2021-06-30 MED ORDER — OXYCODONE HCL 5 MG PO TABS: 2.5000 mg | ORAL_TABLET | ORAL | Status: DC | PRN

## 2021-06-30 MED ORDER — CEFAZOLIN SODIUM-DEXTROSE 2-3 GM-%(50ML) IV SOLR
INTRAVENOUS | Status: DC | PRN
  Administered 2021-06-30: 2 g via INTRAVENOUS

## 2021-06-30 MED ORDER — FENTANYL CITRATE (PF) 100 MCG/2ML IJ SOLN
25.0000 ug | INTRAMUSCULAR | Status: DC | PRN
  Administered 2021-06-30 (×2): 50 ug via INTRAVENOUS

## 2021-06-30 MED ORDER — CHLORHEXIDINE GLUCONATE CLOTH 2 % EX PADS
6.0000 | MEDICATED_PAD | Freq: Every day | CUTANEOUS | Status: DC
  Administered 2021-06-30 – 2021-07-07 (×8): 6 via TOPICAL

## 2021-06-30 MED ORDER — FENTANYL CITRATE PF 50 MCG/ML IJ SOSY: 12.5000 ug | PREFILLED_SYRINGE | INTRAMUSCULAR | Status: DC | PRN

## 2021-06-30 MED ORDER — PHENYLEPHRINE HCL-NACL 20-0.9 MG/250ML-% IV SOLN
INTRAVENOUS | Status: DC | PRN
  Administered 2021-06-30: 20 ug/min via INTRAVENOUS

## 2021-06-30 MED ORDER — LIDOCAINE 2% (20 MG/ML) 5 ML SYRINGE
INTRAMUSCULAR | Status: DC | PRN
Start: 2021-06-30 — End: 2021-06-30
  Administered 2021-06-30: 100 mg via INTRAVENOUS

## 2021-06-30 MED ORDER — 0.9 % SODIUM CHLORIDE (POUR BTL) OPTIME
TOPICAL | Status: DC | PRN
Start: 2021-06-30 — End: 2021-06-30
  Administered 2021-06-30 (×3): 1000 mL

## 2021-06-30 MED ORDER — PROPOFOL 10 MG/ML IV BOLUS
INTRAVENOUS | Status: DC | PRN
  Administered 2021-06-30: 100 mg via INTRAVENOUS

## 2021-06-30 MED ORDER — FENTANYL CITRATE (PF) 250 MCG/5ML IJ SOLN
INTRAMUSCULAR | Status: DC | PRN
  Administered 2021-06-30 (×2): 50 ug via INTRAVENOUS

## 2021-06-30 MED ORDER — SUGAMMADEX SODIUM 200 MG/2ML IV SOLN
INTRAVENOUS | Status: DC | PRN
Start: 2021-06-30 — End: 2021-06-30
  Administered 2021-06-30: 300 mg via INTRAVENOUS

## 2021-06-30 MED ORDER — ONDANSETRON HCL 4 MG/2ML IJ SOLN: 4.0000 mg | Freq: Four times a day (QID) | INTRAMUSCULAR | Status: DC | PRN

## 2021-06-30 MED ORDER — OXYCODONE HCL 5 MG/5ML PO SOLN: 5.0000 mg | Freq: Once | ORAL | Status: AC | PRN

## 2021-06-30 MED ORDER — PROPOFOL 10 MG/ML IV BOLUS
INTRAVENOUS | Status: AC
  Filled 2021-06-30: qty 20

## 2021-06-30 MED ORDER — LACTATED RINGERS IV SOLN: INTRAVENOUS | Status: DC | PRN

## 2021-06-30 MED ORDER — PHENYLEPHRINE 40 MCG/ML (10ML) SYRINGE FOR IV PUSH (FOR BLOOD PRESSURE SUPPORT)
PREFILLED_SYRINGE | INTRAVENOUS | Status: DC | PRN
Start: 2021-06-30 — End: 2021-06-30
  Administered 2021-06-30: 40 ug via INTRAVENOUS
  Administered 2021-06-30: 80 ug via INTRAVENOUS
  Administered 2021-06-30: 40 ug via INTRAVENOUS
  Administered 2021-06-30 (×2): 120 ug via INTRAVENOUS

## 2021-06-30 MED ORDER — ACETAMINOPHEN 650 MG RE SUPP: 650.0000 mg | Freq: Four times a day (QID) | RECTAL | Status: DC

## 2021-06-30 MED ORDER — ACETAMINOPHEN 500 MG PO TABS
1000.0000 mg | ORAL_TABLET | Freq: Four times a day (QID) | ORAL | Status: DC
  Administered 2021-06-30 – 2021-07-17 (×57): 1000 mg via ORAL
  Filled 2021-06-30 (×63): qty 2

## 2021-06-30 MED ORDER — DEXMEDETOMIDINE (PRECEDEX) IN NS 20 MCG/5ML (4 MCG/ML) IV SYRINGE
PREFILLED_SYRINGE | INTRAVENOUS | Status: DC | PRN
  Administered 2021-06-30: 4 ug via INTRAVENOUS
  Administered 2021-06-30: 8 ug via INTRAVENOUS

## 2021-06-30 MED ORDER — HEPARIN (PORCINE) 25000 UT/250ML-% IV SOLN
1400.0000 [IU]/h | INTRAVENOUS | Status: DC
  Administered 2021-06-30 – 2021-07-01 (×2): 1200 [IU]/h via INTRAVENOUS
  Administered 2021-07-02 – 2021-07-03 (×2): 1400 [IU]/h via INTRAVENOUS
  Filled 2021-06-30 (×3): qty 250

## 2021-06-30 MED ORDER — OXYCODONE HCL 5 MG PO TABS
5.0000 mg | ORAL_TABLET | Freq: Once | ORAL | Status: AC | PRN
  Administered 2021-06-30: 5 mg via ORAL

## 2021-06-30 MED ORDER — ROCURONIUM BROMIDE 10 MG/ML (PF) SYRINGE
PREFILLED_SYRINGE | INTRAVENOUS | Status: DC | PRN
Start: 2021-06-30 — End: 2021-06-30
  Administered 2021-06-30: 100 mg via INTRAVENOUS

## 2021-06-30 MED ORDER — ROCURONIUM BROMIDE 10 MG/ML (PF) SYRINGE
PREFILLED_SYRINGE | INTRAVENOUS | Status: AC
  Filled 2021-06-30: qty 20

## 2021-06-30 MED ORDER — OXYCODONE HCL 5 MG PO TABS
ORAL_TABLET | ORAL | Status: AC
  Filled 2021-06-30: qty 1

## 2021-06-30 MED ORDER — DEXAMETHASONE SODIUM PHOSPHATE 10 MG/ML IJ SOLN
INTRAMUSCULAR | Status: DC | PRN
Start: 2021-06-30 — End: 2021-06-30
  Administered 2021-06-30: 10 mg via INTRAVENOUS

## 2021-06-30 MED ORDER — ACETAMINOPHEN 325 MG PO TABS: 325.0000 mg | ORAL_TABLET | ORAL | Status: DC | PRN

## 2021-06-30 MED ORDER — ONDANSETRON HCL 4 MG/2ML IJ SOLN
INTRAMUSCULAR | Status: AC
  Filled 2021-06-30: qty 2

## 2021-06-30 MED ORDER — FENTANYL CITRATE (PF) 100 MCG/2ML IJ SOLN
INTRAMUSCULAR | Status: AC
  Filled 2021-06-30: qty 2

## 2021-06-30 MED ORDER — PHENYLEPHRINE 40 MCG/ML (10ML) SYRINGE FOR IV PUSH (FOR BLOOD PRESSURE SUPPORT)
PREFILLED_SYRINGE | INTRAVENOUS | Status: AC
  Filled 2021-06-30: qty 10

## 2021-06-30 MED ORDER — HEPARIN (PORCINE) 25000 UT/250ML-% IV SOLN: 1200.0000 [IU]/h | INTRAVENOUS | Status: DC

## 2021-06-30 MED ORDER — SODIUM CHLORIDE 0.9 % IV SOLN: INTRAVENOUS | Status: DC | PRN

## 2021-06-30 MED ORDER — ACETAMINOPHEN 160 MG/5ML PO SOLN: 325.0000 mg | ORAL | Status: DC | PRN

## 2021-06-30 MED ORDER — MEPERIDINE HCL 25 MG/ML IJ SOLN: 6.2500 mg | INTRAMUSCULAR | Status: DC | PRN

## 2021-06-30 MED ORDER — FENTANYL CITRATE (PF) 250 MCG/5ML IJ SOLN
INTRAMUSCULAR | Status: AC
  Filled 2021-06-30: qty 5

## 2021-06-30 MED ORDER — ONDANSETRON HCL 4 MG/2ML IJ SOLN
INTRAMUSCULAR | Status: DC | PRN
Start: 2021-06-30 — End: 2021-06-30
  Administered 2021-06-30: 4 mg via INTRAVENOUS

## 2021-06-30 MED ORDER — BOOST / RESOURCE BREEZE PO LIQD CUSTOM
1.0000 | Freq: Three times a day (TID) | ORAL | Status: DC
  Administered 2021-06-30 – 2021-07-16 (×43): 1 via ORAL
  Filled 2021-06-30 (×2): qty 1

## 2021-06-30 MED ORDER — DEXAMETHASONE SODIUM PHOSPHATE 10 MG/ML IJ SOLN
INTRAMUSCULAR | Status: AC
  Filled 2021-06-30: qty 1

## 2021-06-30 MED ORDER — ONDANSETRON HCL 4 MG/2ML IJ SOLN: 4.0000 mg | Freq: Once | INTRAMUSCULAR | Status: DC | PRN

## 2021-06-30 MED ORDER — LIDOCAINE 2% (20 MG/ML) 5 ML SYRINGE
INTRAMUSCULAR | Status: AC
  Filled 2021-06-30: qty 5

## 2021-06-30 SURGICAL SUPPLY — 46 items
BAG COUNTER SPONGE SURGICOUNT (BAG) ×2 IMPLANT
BAG SPNG CNTER NS LX DISP (BAG) ×1
BLADE CLIPPER SURG (BLADE) ×1 IMPLANT
CANISTER SUCT 3000ML PPV (MISCELLANEOUS) ×1 IMPLANT
COVER SURGICAL LIGHT HANDLE (MISCELLANEOUS) ×3 IMPLANT
DRSG OPSITE POSTOP 4X10 (GAUZE/BANDAGES/DRESSINGS) IMPLANT
DRSG OPSITE POSTOP 4X8 (GAUZE/BANDAGES/DRESSINGS) ×1 IMPLANT
ELECT CAUTERY BLADE 6.4 (BLADE) ×2 IMPLANT
ELECT REM PT RETURN 9FT ADLT (ELECTROSURGICAL) ×2
ELECTRODE REM PT RTRN 9FT ADLT (ELECTROSURGICAL) ×1 IMPLANT
GLOVE SRG 8 PF TXTR STRL LF DI (GLOVE) ×2 IMPLANT
GLOVE SURG ENC MOIS LTX SZ8 (GLOVE) ×2 IMPLANT
GLOVE SURG UNDER POLY LF SZ8 (GLOVE)
GOWN STRL REUS W/ TWL LRG LVL3 (GOWN DISPOSABLE) ×4 IMPLANT
GOWN STRL REUS W/ TWL XL LVL3 (GOWN DISPOSABLE) ×2 IMPLANT
GOWN STRL REUS W/TWL LRG LVL3 (GOWN DISPOSABLE) ×12
GOWN STRL REUS W/TWL XL LVL3 (GOWN DISPOSABLE)
KIT SIGMOIDOSCOPE (SET/KITS/TRAYS/PACK) IMPLANT
KIT TURNOVER KIT B (KITS) ×2 IMPLANT
LIGASURE IMPACT 36 18CM CVD LR (INSTRUMENTS) ×2 IMPLANT
NS IRRIG 1000ML POUR BTL (IV SOLUTION) ×4 IMPLANT
PACK COLON (CUSTOM PROCEDURE TRAY) ×2 IMPLANT
PAD ARMBOARD 7.5X6 YLW CONV (MISCELLANEOUS) ×4 IMPLANT
PENCIL SMOKE EVACUATOR (MISCELLANEOUS) ×1 IMPLANT
RELOAD PROXIMATE 75MM BLUE (ENDOMECHANICALS) ×4 IMPLANT
RELOAD STAPLE 75 3.8 BLU REG (ENDOMECHANICALS) IMPLANT
RETRACTOR WND ALEXIS 25 LRG (MISCELLANEOUS) IMPLANT
RTRCTR WOUND ALEXIS 25CM LRG (MISCELLANEOUS) ×2
SPONGE T-LAP 18X18 ~~LOC~~+RFID (SPONGE) ×3 IMPLANT
STAPLER GUN LINEAR PROX 60 (STAPLE) ×1 IMPLANT
STAPLER PROXIMATE 75MM BLUE (STAPLE) ×1 IMPLANT
STAPLER VISISTAT 35W (STAPLE) ×2 IMPLANT
SURGILUBE 2OZ TUBE FLIPTOP (MISCELLANEOUS) IMPLANT
SUT PDS AB 1 TP1 96 (SUTURE) ×5 IMPLANT
SUT PROLENE 2 0 CT2 30 (SUTURE) IMPLANT
SUT PROLENE 2 0 KS (SUTURE) IMPLANT
SUT SILK 2 0 SH CR/8 (SUTURE) ×2 IMPLANT
SUT SILK 2 0 TIES 10X30 (SUTURE) ×2 IMPLANT
SUT SILK 3 0 SH CR/8 (SUTURE) ×2 IMPLANT
SUT SILK 3 0 TIES 10X30 (SUTURE) ×2 IMPLANT
SUT VIC AB 3-0 SH 18 (SUTURE) IMPLANT
SUT VIC AB 3-0 SH 27 (SUTURE) ×2
SUT VIC AB 3-0 SH 27X BRD (SUTURE) IMPLANT
TRAY FOLEY MTR SLVR 14FR STAT (SET/KITS/TRAYS/PACK) ×2 IMPLANT
TUBE CONNECTING 12X1/4 (SUCTIONS) ×4 IMPLANT
UNDERPAD 30X36 HEAVY ABSORB (UNDERPADS AND DIAPERS) IMPLANT

## 2021-06-30 NOTE — Progress Notes (Signed)
ANTICOAGULATION CONSULT NOTE  Pharmacy Consult for Heparin Indication:  mechanical mitral valve and atrial fibrillation  Allergies  Allergen Reactions   Flexeril [Cyclobenzaprine] Diarrhea    Patient Measurements: Height: 6' (182.9 cm) Weight: 68 kg (149 lb 14.6 oz) IBW/kg (Calculated) : 77.6  Heparin Dosing Weight: 68 kg  Vital Signs: Temp: 98.1 F (36.7 C) (01/03 1318) Temp Source: Oral (01/03 1118) BP: 147/80 (01/03 1318) Pulse Rate: 109 (01/03 1318)  Labs: Recent Labs    06/28/21 0036 06/28/21 2015 06/28/21 2129 06/29/21 0255 06/29/21 1844 06/30/21 0256  HGB 7.8* 9.3*  --  8.8*  --  9.7*  HCT 23.4* 27.3*  --  27.4*  --  28.8*  PLT 154  --   --  160  --  160  HEPARINUNFRC 0.54  --  0.38  --  0.18* 0.36  CREATININE 1.38*  --   --  1.32*  --  1.41*    Estimated Creatinine Clearance: 31.5 mL/min (A) (by C-G formula based on SCr of 1.41 mg/dL (H)).   Assessment: 88 yo M admitted with GIB on 12/28. PTA warfarin (LD 12/28 @ 0730) for mechanical mitral valve and atrial fibrillation. INR 3.9 > IV vitamin k 5mg  12/29 > INR 1.2 on recheck. Pharmacy consulted to start IV heparin while warfarin is on hold.  PTA warfarin 5.5mg  daily, INR goal 2.5-3.5    Per surgery, heparin infusion stopped 01/02 at 0400 for possible surgery. Surgery postponed due to OR scheduling. Heparin infusion resumed 01/02 at previous rate and stopped this morning at 0400 on 01/03 for procedure. CBC stable, plts wnl. Now cleared to resumed heparin infusion.    Goal of Therapy:  Heparin level 0.3-0.5 units/ml Monitor platelets by anticoagulation protocol: Yes   Plan:  Resume heparin infusion at 1200 units/hr Check heparin level in 8 hours and daily while on heparin Continue to monitor H&H and platelets    Thank you for allowing pharmacy to be a part of this patients care.  Ardyth Harps, PharmD Clinical Pharmacist

## 2021-06-30 NOTE — Progress Notes (Signed)
ANTICOAGULATION CONSULT NOTE - Follow Up Consult  Pharmacy Consult for IV Heparin Indication:  mechanical mitral valve and atrial fibrillation  Allergies  Allergen Reactions   Flexeril [Cyclobenzaprine] Diarrhea    Patient Measurements: Height: 6' (182.9 cm) Weight: 68 kg (149 lb 14.6 oz) IBW/kg (Calculated) : 77.6 Heparin Dosing Weight: 68 kg  Vital Signs: Temp: 98 F (36.7 C) (01/03 1505) Temp Source: Axillary (01/03 1505) BP: 149/91 (01/03 1505) Pulse Rate: 106 (01/03 1505)  Labs: Recent Labs    06/28/21 0036 06/28/21 2015 06/28/21 2129 06/29/21 0255 06/29/21 1844 06/30/21 0256 06/30/21 1435  HGB 7.8*   < >  --  8.8*  --  9.7* 11.1*  HCT 23.4*   < >  --  27.4*  --  28.8* 34.2*  PLT 154  --   --  160  --  160  --   HEPARINUNFRC 0.54  --  0.38  --  0.18* 0.36  --   CREATININE 1.38*  --   --  1.32*  --  1.41*  --    < > = values in this interval not displayed.    Estimated Creatinine Clearance: 31.5 mL/min (A) (by C-G formula based on SCr of 1.41 mg/dL (H)).   Assessment: 88 yr old man admitted on 06/24/21 with GI bleed, transverse colon mass, likely malignant (path pending). PTA, pt was taking warfarin (last dose12/28 @ 0730) for mechanical mitral valve and atrial fibrillation. INR 3.9 > IV vitamin K 5 mg IV on 12/29 > INR 1.2 on recheck. Pharmacy was consulted to start IV heparin while warfarin is on hold.  PTA warfarin regimen: 5.5 mg daily, INR goal 2.5-3.5    Pt had therapeutic heparin level (0.36 units/ml) on heparin infusion at 1200 units/hr prior to OR today. Heparin infusion was stopped 0400 in preparation for surgery (partial colectomy). Per surgery, may resume heparin infusion (no bolus) at 2000 tonight.  H/H 11.1/34.2, plt 160. Per RN, no bleeding issues post op.  Goal of Therapy:  Heparin level 0.3-0.5 units/ml Monitor platelets by anticoagulation protocol: Yes   Plan:  Resume heparin infusion at 1200 units/hr at 2000 tonight Check heparin level  8 hrs after resuming heparin infusion Monitor daily heparin level, CBC F/U transition back to warfarin when able  Thank you for allowing pharmacy to be a part of this patients care.  Gillermina Hu, PharmD, BCPS, [facility] Clinical Pharmacist

## 2021-06-30 NOTE — Progress Notes (Signed)
Awakened from sleep for transport/ staes his stomach hurts and "it is moderate pain" / reassured /medicated

## 2021-06-30 NOTE — Anesthesia Postprocedure Evaluation (Signed)
Anesthesia Post Note  Patient: Richard Spears  Procedure(s) Performed: PARTIAL COLECTOMY     Patient location during evaluation: PACU Anesthesia Type: General Level of consciousness: awake and alert Pain management: pain level controlled Vital Signs Assessment: post-procedure vital signs reviewed and stable Respiratory status: spontaneous breathing, nonlabored ventilation, respiratory function stable and patient connected to nasal cannula oxygen Cardiovascular status: blood pressure returned to baseline and stable Postop Assessment: no apparent nausea or vomiting Anesthetic complications: no   No notable events documented.  Last Vitals:  Vitals:   06/30/21 1014 06/30/21 1015  BP: 122/79   Pulse: (!) 111   Resp: 15   Temp:    SpO2: (!) 89% 97%    Last Pain:  Vitals:   06/30/21 1015  TempSrc:   PainSc: Asleep                 Naftoli Penny

## 2021-06-30 NOTE — Progress Notes (Signed)
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  In the ED hemoglobin was down to 8.6, INR was 3.9.  CT angio abdomen-noted enhancing masslike filling defect within the mid transverse colon is identified. Patient was admitted and GI consulted.   Colonoscopy on 12/31 identified a near-obstructing mass in the transverse colon.    General surgery consulted and plan for surgical resection.  Assessment and Plan * GI bleeding- (present on admission) Due to a mass in transverse colon identified during colonoscopy on 06/27/21. --GI, General surgery consulted --In surgery this AM for resection --Cardiology consulted for pre-op clearance --NPO this AM --Post-op diet per surgery --Pain control PRN --Monitor abdominal exam, return of bowel function  Acute blood loss anemia- (present on admission) Presented with GI bleeding and found with transverse colon mass on colonoscopy; chronically on coumadin for mechanical MVR. Hbg 7.8 on 1/1, transfused 1 unit pRBC's Hbg 9.3 post-transfusion >> 8.8 >> 9.7 this AM No further bleeding has been noted. --Follow CBC's  CKD (chronic kidney disease) stage 3, GFR 30-59 ml/min (HCC)- (present on admission) Renal function stable, near baseline. -Monitor BMP  Hyponatremia- (present on admission) Mild, Na 133>> 135 >>134.  -Monitor BMP  Essential hypertension- (present on admission) BP's controlled, currently off antihypertensives. -Monitor, resume meds when indicated  Longstanding persistent atrial fibrillation:  CHA2DS2-VASc Score 3- (present on admission) Monitor on telemetry. Coumadin on hold. Heparin on hold this AM for surgery, resume post-op  S/P MVR (mitral valve replacement) On Coumadin which is held. Resume heparin postop. Follow INR's when coumadin resumed.     Subjective: Pt sleeping soundly, seen after returning from PACU postoperatively today.  No surgical complications reported.  Pt appears comfortable, in no distress.  No family present at the time.    Objective Vitals reviewed notable for BP stable, HR 90's to 100's, on 2 L/min [place] O2, no fevers  General exam: sleeping comfortably, no acute distress Respiratory system: CTAB, no wheezes, rales or rhonchi, normal respiratory effort. Cardiovascular system: normal S1/S2, tachycardic, regular rhythm, no pedal edema.   Gastrointestinal system: clean dry intact midline dressing, not palpated immediately postop Central nervous system: unable to assess, pt somnolent Skin: dry, intact, normal temperature, pale    Data Reviewed: Labs notable for Hbg 8.8>>9.7 this AM, Na 134, CO2 21, Cr 1.32>>1.41  Family Communication: none at bedside on rounds, will attempt to call.    Disposition: Status is: Inpatient  Remains inpatient appropriate because: Underwent surgery today, await advancement of diet, return of bowel function, transition off heparin back to coumadin when cleared by surgery to do so         Time spent: 35 minutes  Author: Ezekiel Slocumb 06/30/2021 6:11 PM  For on call review [email or web address].

## 2021-06-30 NOTE — Transfer of Care (Signed)
Immediate Anesthesia Transfer of Care Note  Patient: Richard Spears  Procedure(s) Performed: PARTIAL COLECTOMY  Patient Location: PACU  Anesthesia Type:General  Level of Consciousness: drowsy  Airway & Oxygen Therapy: Patient Spontanous Breathing and Patient connected to face mask oxygen  Post-op Assessment: Report given to RN and Post -op Vital signs reviewed and stable  Post vital signs: Reviewed and stable  Last Vitals:  Vitals Value Taken Time  BP 146/77 06/30/21 0959  Temp    Pulse 102 06/30/21 1001  Resp 16 06/30/21 1001  SpO2 99 % 06/30/21 1001  Vitals shown include unvalidated device data.  Last Pain:  Vitals:   06/30/21 0400  TempSrc: Oral  PainSc:       Patients Stated Pain Goal: 0 (37/36/68 1594)  Complications: No notable events documented.

## 2021-06-30 NOTE — Op Note (Signed)
Operative Note  OMERE MARTI  [id number]  [id number]  06/30/2021   Surgeon: Romana Juniper MD FACS   Procedure performed: partial (transverse) colectomy  Procedure status: urgent, not elective   Preop diagnosis: bleeding and obstructing transverse colon mass Post-op diagnosis/intraop findings: same, palpable adenopathy in the mesentery and retroperitoneum   Specimens: segment of transverse colon, stitch marks proximal Retained items: no  EBL: minimal cc Complications: none   Description of procedure: After obtaining informed consent the patient was taken to the operating room and placed supine on operating room table where general endotracheal anesthesia was initiated, preoperative antibiotics were administered, SCDs applied, and a formal timeout was performed.  Foley catheter was inserted under sterile conditions.  The abdomen was prepped and draped in usual sterile fashion.  A midline laparotomy was created and peritoneal entry was clean.  No contamination or ascites was present.  An [place] wound protector was placed and the abdomen was surveyed.  The mass was immediately palpable in the mid transverse colon was within a very mobile segment of the bowel.  There was some lymphadenopathy palpable in the colonic mesentery adjacent to this and additionally a very large area of what felt like lymphadenopathy in the retroperitoneum just superior to the pancreas and medial to the porta.  No palpable masses in the liver or along the peritoneal surfaces.  The stomach appeared without abnormality and the small bowel additionally was without any palpable mass or visible abnormality.  The area of the colon mass was again identified and omentum taken off of this using cautery and the LigaSure.  This section was resected with serial fires of the blue load 75 mm Endo GIA stapler, taking an approximately 5 cm margin on either side of the mass.  The mesentery was divided using the LigaSure, including with  the specimen some palpable lymph nodes within the mesentery.  Hemostasis was ensured.  The mass was marked with a 2-0 silk stitch on the proximal border and handed off for pathology.  A side-to-side functional end-to-end colonic anastomosis was then created with a blue load 75 mm Endo GIA stapler.  The common enterotomy was closed with a TX 60 blue load.  2 simple interrupted 3-0 silk sutures were placed at the apex of the staple line.  The corners of the [place] 60 staple line were imbricated with 3-0 silks.  Bleeding points along the staple line were addressed with 3-0 silks as well.  Mesentery was closed with a running 2-0 silk.  A tongue of omentum was then brought over the anastomosis and tethered to this area with interrupted 2-0 silks to form an omentopexy.  On completion the anastomosis appears very well perfused, is palpably widely patent and without any tension present.  The abdomen was irrigated with warm sterile saline and the [place] wound protector was removed.  Clean-dirty protocol was followed and all the drapes, instruments, gowns and gloves were exchanged for new ones. The fascia was closed with running looped #1 PDS starting at either end and tying centrally.  The tails of the PDS were buried with deep dermal 3-0 Vicryl's.  Hemostasis was again ensured within the wound and the skin was closed with staples.  A sterile dressing was applied. the patient was then awakened, extubated and taken to PACU in stable condition.    All counts were correct at the completion of the case.

## 2021-06-30 NOTE — Plan of Care (Signed)

## 2021-06-30 NOTE — Anesthesia Procedure Notes (Signed)
Procedure Name: Intubation Date/Time: 06/30/2021 8:08 AM Performed by: Vonna Drafts, CRNA Pre-anesthesia Checklist: Patient identified, Emergency Drugs available, Suction available and Patient being monitored Patient Re-evaluated:Patient Re-evaluated prior to induction Oxygen Delivery Method: Circle system utilized Preoxygenation: Pre-oxygenation with 100% oxygen Induction Type: IV induction Ventilation: Mask ventilation without difficulty Laryngoscope Size: Mac and 4 Grade View: Grade I Tube type: Oral Tube size: 7.5 mm Number of attempts: 1 Airway Equipment and Method: Stylet and Oral airway Placement Confirmation: ETT inserted through vocal cords under direct vision, positive ETCO2 and breath sounds checked- equal and bilateral Secured at: 24 cm Tube secured with: Tape Dental Injury: Teeth and Oropharynx as per pre-operative assessment

## 2021-06-30 NOTE — Interval H&P Note (Signed)
History and Physical Interval Note:  06/30/2021 7:36 AM  Richard Spears  has presented today for surgery, with the diagnosis of GI BLEED.  The various methods of treatment have been discussed with the patient and family. After consideration of risks, benefits and other options for treatment, the patient has consented to  Procedure(s): PARTIAL COLECTOMY (N/A) as a surgical intervention.  The patient's history has been reviewed, patient examined, no change in status, stable for surgery.  I have reviewed the patient's chart and labs.  Questions were answered to the patient's satisfaction.     [place] Korver Rich Brave

## 2021-07-01 ENCOUNTER — Encounter (HOSPITAL_COMMUNITY): Payer: Self-pay | Admitting: Surgery

## 2021-07-01 DIAGNOSIS — D62 Acute posthemorrhagic anemia: Secondary | ICD-10-CM | POA: Diagnosis not present

## 2021-07-01 LAB — COMPREHENSIVE METABOLIC PANEL
ALT: 12 U/L (ref 0–44)
AST: 24 U/L (ref 15–41)
Albumin: 2.4 g/dL — ABNORMAL LOW (ref 3.5–5.0)
Alkaline Phosphatase: 48 U/L (ref 38–126)
Anion gap: 7 (ref 5–15)
BUN: 20 mg/dL (ref 8–23)
CO2: 19 mmol/L — ABNORMAL LOW (ref 22–32)
Calcium: 8.2 mg/dL — ABNORMAL LOW (ref 8.9–10.3)
Chloride: 105 mmol/L (ref 98–111)
Creatinine, Ser: 1.49 mg/dL — ABNORMAL HIGH (ref 0.61–1.24)
GFR, Estimated: 43 mL/min — ABNORMAL LOW (ref >60)
Glucose, Bld: 174 mg/dL — ABNORMAL HIGH (ref 70–99)
Potassium: 4.8 mmol/L (ref 3.5–5.1)
Sodium: 131 mmol/L — ABNORMAL LOW (ref 135–145)
Total Bilirubin: 0.6 mg/dL (ref 0.3–1.2)
Total Protein: 5.7 g/dL — ABNORMAL LOW (ref 6.5–8.1)

## 2021-07-01 LAB — OSMOLALITY, URINE: Osmolality, Ur: 608 mOsm/kg (ref 300–900)

## 2021-07-01 LAB — HEPARIN LEVEL (UNFRACTIONATED)
Heparin Unfractionated: 0.35 IU/mL (ref 0.30–0.70)
Heparin Unfractionated: 0.43 IU/mL (ref 0.30–0.70)

## 2021-07-01 LAB — SODIUM, URINE, RANDOM: Sodium, Ur: <10 mmol/L

## 2021-07-01 LAB — OSMOLALITY: Osmolality: 288 mOsm/kg (ref 275–295)

## 2021-07-01 LAB — URIC ACID: Uric Acid, Serum: 7.6 mg/dL (ref 3.7–8.6)

## 2021-07-01 LAB — CBC
HCT: 30.3 % — ABNORMAL LOW (ref 39.0–52.0)
Hemoglobin: 10.1 g/dL — ABNORMAL LOW (ref 13.0–17.0)
MCH: 31.5 pg (ref 26.0–34.0)
MCHC: 33.3 g/dL (ref 30.0–36.0)
MCV: 94.4 fL (ref 80.0–100.0)
Platelets: 161 10*3/uL (ref 150–400)
RBC: 3.21 MIL/uL — ABNORMAL LOW (ref 4.22–5.81)
RDW: 18 % — ABNORMAL HIGH (ref 11.5–15.5)
WBC: 8.4 10*3/uL (ref 4.0–10.5)
nRBC: 0 % (ref 0.0–0.2)

## 2021-07-01 LAB — MAGNESIUM: Magnesium: 2 mg/dL (ref 1.7–2.4)

## 2021-07-01 MED ORDER — LACTATED RINGERS IV SOLN: INTRAVENOUS | Status: AC

## 2021-07-01 NOTE — Evaluation (Signed)
Physical Therapy Evaluation Patient Details Name: Richard Spears MRN: [id number] DOB: [birth year] Today's Date: 07/01/2021  History of Present Illness  88 y.o. male presentign to ED 12/28 with melana. Patient admitted with GI bleed and anemia. CT (+) lower GI bleed vs mass. CT also found large L inguinal hernia containing nonobstructed loop of large bowel. Colonoscopy 12/31 confirmed near obstructing mass in transverse colon. Biopsies obtained; path pending. S/p partial transverse colectomy 1/3. PMH includes: A-fib, BPH, OA, HTN, BLE venous ulcers, renal insufficiency, mechanical mitral valve replacement, Hx of fall 08/2020 s/p L femur ORIF and R/L hip arthroplasties.  Clinical Impression  Pt admitted with/for problems and surgical intervention as stated above.  Pt needing minimal assist and occasional moderated assist for all basic mobility.  Pt currently limited functionally due to the problems listed. ( See problems list.)   Pt will benefit from PT to maximize function and safety in order to get ready for next venue listed below.        Recommendations for follow up therapy are one component of a multi-disciplinary discharge planning process, led by the attending physician.  Recommendations may be updated based on patient status, additional functional criteria and insurance authorization.  Follow Up Recommendations Skilled nursing-short term rehab (<3 hours/day) ([facility])    Assistance Recommended at Discharge Intermittent Supervision/Assistance  Patient can return home with the following       Equipment Recommendations None recommended by PT  Recommendations for Other Services       Functional Status Assessment Patient has had a recent decline in their functional status and demonstrates the ability to make significant improvements in function in a reasonable and predictable amount of time.     Precautions / Restrictions Precautions Precautions: Fall Precaution  Comments: Hx of falls; monitor HR; abdominal incision w/ precautions; BLE unna boots      Mobility  Bed Mobility Overal bed mobility: Needs Assistance Bed Mobility: Rolling;Sidelying to Sit;Sit to Sidelying Rolling: Min assist Sidelying to sit: Min assist;HOB elevated     Sit to sidelying: Mod assist General bed mobility comments: Min A for rolling to R and Min A to elevate trunk +rail. Increased time/effort.  More difficulty time getting into bed with safe sequencing.    Transfers Overall transfer level: Needs assistance Equipment used: Rolling walker (2 wheels) Transfers: Sit to/from Stand Sit to Stand: Min assist           General transfer comment: Min A for sit to stand from EOB to RW with increased time to rise. Cues for hand placement.    Ambulation/Gait Ambulation/Gait assistance: Min assist Gait Distance (Feet): 50 Feet (x2) Assistive device: Rolling walker (2 wheels) Gait Pattern/deviations: Step-through pattern   Gait velocity interpretation: <1.8 ft/sec, indicate of risk for recurrent falls   General Gait Details: short, mildly unsteady steps with narrowed BOS, cues for posture and proximity to the RW.  Stairs            Wheelchair Mobility    Modified Rankin (Stroke Patients Only)       Balance Overall balance assessment: Needs assistance Sitting-balance support: No upper extremity supported;Feet supported Sitting balance-Leahy Scale: Fair     Standing balance support: Bilateral upper extremity supported;During functional activity;Reliant on assistive device for balance Standing balance-Leahy Scale: Fair Standing balance comment: Able to maintain static standing with unilateral UE support on RW. Reliant on BUE support with dynamic balance.  Pertinent Vitals/Pain Pain Assessment: Faces Faces Pain Scale: Hurts little more Pain Location: Abdomen Pain Descriptors / Indicators: Sore;Grimacing Pain  Intervention(s): Monitored during session    Home Living Family/patient expects to be discharged to:: Other (Comment) ([place]) Living Arrangements: Spouse/significant other Available Help at Discharge: Family Type of Home: Independent living facility Home Access: Level entry;Elevator       Home Layout: One level Home Equipment: Conservation officer, nature (2 wheels);Rollator (4 wheels);Shower seat - built in;Hand held shower head      Prior Function Prior Level of Function : Independent/Modified Independent             Mobility Comments: RW in ILF apartment and rollator in community dwellings. ADLs Comments: I w/ ADLs/IADLs; drives, makes small meals for himself and for his wife.     Hand Dominance   Dominant Hand: Right    Extremity/Trunk Assessment   Upper Extremity Assessment Upper Extremity Assessment: Generalized weakness    Lower Extremity Assessment Lower Extremity Assessment: Generalized weakness    Cervical / Trunk Assessment Cervical / Trunk Assessment: Kyphotic  Communication   Communication: Expressive difficulties (Mild)  Cognition Arousal/Alertness: Awake/alert Behavior During Therapy: WFL for tasks assessed/performed                                   General Comments: A&Ox4; follows 1-2 step verbal commands without difficulty; requires increased time to process verbal information, mild expressive difficulty.        General Comments General comments (skin integrity, edema, etc.): HR vaiable (?afib 110's to 130's) with/without activity, SpO2 98% on RA    Exercises     Assessment/Plan    PT Assessment Patient needs continued PT services  PT Problem List Decreased strength;Decreased activity tolerance;Decreased balance;Decreased mobility;Cardiopulmonary status limiting activity       PT Treatment Interventions      PT Goals (Current goals can be found in the Care Plan section)  Acute Rehab PT Goals Patient Stated Goal: To  health care at [facility] to work on [employer] PT Goal Formulation: With patient Time For Goal Achievement: 07/15/21 Potential to Achieve Goals: Good    Frequency Min 3X/week     Co-evaluation               AM-PAC PT "6 Clicks" Mobility  Outcome Measure Help needed turning from your back to your side while in a flat bed without using bedrails?: A Little Help needed moving from lying on your back to sitting on the side of a flat bed without using bedrails?: A Little Help needed moving to and from a bed to a chair (including a wheelchair)?: A Little Help needed standing up from a chair using your arms (e.g., wheelchair or bedside chair)?: A Little Help needed to walk in hospital room?: A Little Help needed climbing 3-5 steps with a railing? : A Little 6 Click Score: 18    End of Session   Activity Tolerance: Patient tolerated treatment well Patient left: in bed;with call bell/phone within reach;with bed alarm set Nurse Communication: Mobility status PT Visit Diagnosis: Other abnormalities of gait and mobility (R26.89);Muscle weakness (generalized) (M62.81);Difficulty in walking, not elsewhere classified (R26.2)    Time: 2703-5009 PT Time Calculation (min) (ACUTE ONLY): 35 min   Charges:   PT Evaluation $PT Eval Moderate Complexity: 1 Mod PT Treatments $Gait Training: 8-22 mins        07/01/2021  Ginger Carne.,  PT Acute Rehabilitation Services [phone]  (pager) [phone]  (office)  Tessie Fass Nayleah Gamel 07/01/2021, 4:03 PM

## 2021-07-01 NOTE — Progress Notes (Signed)
1 Day Post-Op   Subjective/Chief Complaint: Feeling well. Pain well controlled. NO nausea or bloating, some hiccups. No bowel function yet.    Objective: Vital signs in last 24 hours: Temp:  [97.8 F (36.6 C)-98.3 F (36.8 C)] 97.8 F (36.6 C) (01/04 0727) Pulse Rate:  [106-111] 106 (01/04 0349) Resp:  [14-20] 18 (01/04 0349) BP: (117-166)/(75-91) 134/77 (01/04 0727) SpO2:  [89 %-99 %] 99 % (01/04 0349) FiO2 (%):  [28 %] 28 % (01/03 1005) Last BM Date: 06/27/21  Intake/Output from previous day: 01/03 0701 - 01/04 0700 In: 1409.7 [P.O.:480; I.V.:919.7; IV Piggyback:10] Out: 795 [Urine:770; Blood:25] Intake/Output this shift: No intake/output data recorded.  Exam: Awake and alert Abdomen soft, nd, minimally tender. OR dressing c/d, incision intact with staples, no cellulitis or hematoma  Lab Results:  Recent Labs    06/30/21 0256 06/30/21 1435 07/01/21 0345  WBC 3.4*  --  8.4  HGB 9.7* 11.1* 10.1*  HCT 28.8* 34.2* 30.3*  PLT 160  --  161    BMET Recent Labs    06/30/21 0256 07/01/21 0345  NA 134* 131*  K 4.3 4.8  CL 105 105  CO2 21* 19*  GLUCOSE 84 174*  BUN 10 20  CREATININE 1.41* 1.49*  CALCIUM 8.3* 8.2*    PT/INR No results for input(s): LABPROT, INR in the last 72 hours.  ABG No results for input(s): PHART, HCO3 in the last 72 hours.  Invalid input(s): PCO2, PO2  Studies/Results: No results found.  Anti-infectives: Anti-infectives (From admission, onward)    None       Assessment/Plan: Transverse colon mass, likely cancer-Path is pending Heart valve and Afib on chronic anticoag meds  s/p segmental colectomy 1/3. Try full liquids, mobilize. Will dc foley tomorrow.   Richard Spears 07/01/2021

## 2021-07-01 NOTE — Progress Notes (Signed)
ANTICOAGULATION CONSULT NOTE  Pharmacy Consult for Heparin Indication:  mechanical mitral valve and atrial fibrillation  Allergies  Allergen Reactions   Flexeril [Cyclobenzaprine] Diarrhea    Patient Measurements: Height: 6' (182.9 cm) Weight: 68 kg (149 lb 14.6 oz) IBW/kg (Calculated) : 77.6  Heparin Dosing Weight: 68 kg  Vital Signs: Temp: 97.9 F (36.6 C) (01/04 1147) Temp Source: Oral (01/04 1147) BP: 127/67 (01/04 1147) Pulse Rate: 100 (01/04 1147)  Labs: Recent Labs    06/29/21 0255 06/29/21 1844 06/30/21 0256 06/30/21 1435 07/01/21 0345 07/01/21 1208  HGB 8.8*  --  9.7* 11.1* 10.1*  --   HCT 27.4*  --  28.8* 34.2* 30.3*  --   PLT 160  --  160  --  161  --   HEPARINUNFRC  --    < > 0.36  --  0.35 0.43  CREATININE 1.32*  --  1.41*  --  1.49*  --    < > = values in this interval not displayed.    Estimated Creatinine Clearance: 29.8 mL/min (A) (by C-G formula based on SCr of 1.49 mg/dL (H)).   Assessment: 88 yo M admitted with GIB on 12/28. PTA warfarin (LD 12/28 @ 0730) for mechanical mitral valve and atrial fibrillation. INR 3.9 on admission > IV vitamin k 5mg  12/29 > INR 1.2 on recheck. Pharmacy consulted to start IV heparin while warfarin is on hold.  PTA warfarin 5.5mg  daily, INR goal 2.5-3.5    Per surgery, heparin infusion stopped at 0400 on 01/03 for procedure. Heparin infusion resumed at 2000 post-op. HL therapeutic x2 at 0.35 and 0.43. CBC stable, plts wnl.    Goal of Therapy:  Heparin level 0.3-0.5 units/ml Monitor platelets by anticoagulation protocol: Yes   Plan:  Continue heparin infusion at 1200 units/hr Check heparin level daily while on heparin Continue to monitor H&H and platelets F/u plans for restarting warfarin    Thank you for allowing pharmacy to be a part of this patients care.  Ardyth Harps, PharmD Clinical Pharmacist

## 2021-07-01 NOTE — Progress Notes (Signed)
°   07/01/21 1431  Incentive Spirometry  Incentive Spirometry Goal (mL) (RN or RT) 1000 mL  Incentive Spirometry - Achieved (mL) (RN, NT, or RT) 1500 mL  Incentive Spirometry - # of Times (RN or NT) 3  Incentive Spirometry Effort (RN) Satisfactory  Incentive Spirometry Use (NT) Observed, patient had no questions

## 2021-07-01 NOTE — Evaluation (Signed)
Occupational Therapy Evaluation Patient Details Name: Richard Spears MRN: [id number] DOB: 06-05-1926 Today's Date: 07/01/2021   History of Present Illness 88 y.o. male presentign to ED 12/28 with melana. Patient admitted with GI bleed and anemia. CT (+) lower GI bleed vs mass. CT also found large L inguinal hernia containing nonobstructed loop of large bowel. Colonoscopy 12/31 confirmed near obstructing mass in transverse colon. Biopsies obtained; path pending. S/p partial transverse colectomy 1/3. PMH includes: A-fib, BPH, OA, HTN, BLE venous ulcers, renal insufficiency, mechanical mitral valve replacement, Hx of fall 08/2020 s/p L femur ORIF and R/L hip arthroplasties.   Clinical Impression   PTA patient was living with his spouse in an [place] apartment at [facility] and was grossly Mod I with ADLs/IADLs with AD. Patient drives. Patient currently functioning below baseline demonstrating observed ADLs with Min to Mod A grossly. Patient also limited by deficits listed below including decreased dynamic sitting/standing balance, generalized weakness/debility and decreased activity tolerance and would benefit from continued acute OT services in prep for safe d/c to next level of care. Patient would prefer to return [facility] for rehab. OT will continue to follow acutely.        Recommendations for follow up therapy are one component of a multi-disciplinary discharge planning process, led by the attending physician.  Recommendations may be updated based on patient status, additional functional criteria and insurance authorization.   Follow Up Recommendations  Skilled nursing-short term rehab (<3 hours/day) (Would prefer [facility])    Assistance Recommended at Discharge Frequent or constant Supervision/Assistance  Patient can return home with the following      Functional Status Assessment  Patient has had a recent decline in their functional status and demonstrates the  ability to make significant improvements in function in a reasonable and predictable amount of time.  Equipment Recommendations  None recommended by OT    Recommendations for Other Services       Precautions / Restrictions Precautions Precautions: Fall Precaution Comments: Hx of falls; monitor HR; abdominal incision w/ precautions; BLE unna boots Restrictions Weight Bearing Restrictions: No      Mobility Bed Mobility Overal bed mobility: Needs Assistance Bed Mobility: Rolling;Sidelying to Sit Rolling: Min assist Sidelying to sit: Min assist;HOB elevated       General bed mobility comments: Min A for rolling to R and Min A to elevate trunk +rail. Increased time/effort.    Transfers Overall transfer level: Needs assistance Equipment used: Rolling walker (2 wheels) Transfers: Sit to/from Stand Sit to Stand: Min assist           General transfer comment: Min A for sit to stand from EOB to RW with increased time to rise. Cues for hand placement.      Balance Overall balance assessment: Needs assistance Sitting-balance support: No upper extremity supported;Feet supported Sitting balance-Leahy Scale: Fair     Standing balance support: Bilateral upper extremity supported;During functional activity;Reliant on assistive device for balance Standing balance-Leahy Scale: Fair Standing balance comment: Able to maintain static standing with unilateral UE support on RW. Reliant on BUE support with dynamic balance.                           ADL either performed or assessed with clinical judgement   ADL Overall ADL's : Needs assistance/impaired Eating/Feeding: Set up;Sitting   Grooming: Set up;Sitting   Upper Body Bathing: Minimal assistance;Sitting   Lower Body Bathing: Moderate assistance;Sit  to/from stand   Upper Body Dressing : Minimal assistance;Sitting   Lower Body Dressing: Moderate assistance;Sit to/from stand   Toilet Transfer: Minimal  assistance;Rolling walker (2 wheels) Toilet Transfer Details (indicate cue type and reason): Simulated with transfer to recliner with use of RW and increased time/effort.                 Vision Baseline Vision/History: 1 Wears glasses Ability to See in Adequate Light: 0 Adequate Patient Visual Report: No change from baseline Vision Assessment?: No apparent visual deficits     Perception     Praxis      Pertinent Vitals/Pain Pain Assessment: Faces Faces Pain Scale: Hurts a little bit Pain Location: Abdomen Pain Descriptors / Indicators: Sore;Grimacing Pain Intervention(s): Limited activity within patient's tolerance;Monitored during session;Repositioned     Hand Dominance Right   Extremity/Trunk Assessment Upper Extremity Assessment Upper Extremity Assessment: Generalized weakness   Lower Extremity Assessment Lower Extremity Assessment: Defer to PT evaluation   Cervical / Trunk Assessment Cervical / Trunk Assessment: Kyphotic   Communication Communication Communication: Expressive difficulties (Mild)   Cognition Arousal/Alertness: Awake/alert Behavior During Therapy: WFL for tasks assessed/performed Overall Cognitive Status: No family/caregiver present to determine baseline cognitive functioning                                 General Comments: A&Ox4; follows 1-2 step verbal commands without difficulty; requires increased time to process veral information, mild expressive difficulty.     General Comments  HR 100's at rest. 110's with mild activity. SpO2 97-99% on RA.    Exercises     Shoulder Instructions      Home Living Family/patient expects to be discharged to:: Other (Comment) ([place]) Living Arrangements: Spouse/significant other Available Help at Discharge: Family Type of Home: Independent living facility Home Access: Level entry;Elevator     Home Layout: One level     Bathroom Shower/Tub: Hospital doctor  Toilet: Handicapped height     Home Equipment: Conservation officer, nature (2 wheels);Rollator (4 wheels);Shower seat - built in;Hand held shower head          Prior Functioning/Environment Prior Level of Function : Independent/Modified Independent             Mobility Comments: RW in ILF apartment and rollator in community dwellings. ADLs Comments: I w/ ADLs/IADLs; drives, makes small meals for himself and for his wife.        OT Problem List: Decreased strength;Decreased activity tolerance;Impaired balance (sitting and/or standing);Cardiopulmonary status limiting activity      OT Treatment/Interventions: Self-care/ADL training;Therapeutic exercise;Energy conservation;DME and/or AE instruction;Therapeutic activities;Patient/family education;Balance training    OT Goals(Current goals can be found in the care plan section) Acute Rehab OT Goals Patient Stated Goal: To get stronger OT Goal Formulation: With patient Time For Goal Achievement: 07/15/21 Potential to Achieve Goals: Good ADL Goals Pt Will Perform Grooming: with modified independence;standing Pt Will Perform Upper Body Dressing: with modified independence;sitting Pt Will Perform Lower Body Dressing: with modified independence;sit to/from stand Pt Will Transfer to Toilet: with modified independence;ambulating Pt Will Perform Toileting - Clothing Manipulation and hygiene: with modified independence;sit to/from stand Pt/caregiver will Perform Home Exercise Program: Increased ROM;Increased strength;Both right and left upper extremity;Independently Additional ADL Goal #1: Patient will recall 3 strategies to reduce risk of falls in prep for safe d/c home.  OT Frequency: Min 2X/week    Co-evaluation  AM-PAC OT "6 Clicks" Daily Activity     Outcome Measure Help from another person eating meals?: A Little Help from another person taking care of personal grooming?: A Little Help from another person toileting, which  includes using toliet, bedpan, or urinal?: A Lot Help from another person bathing (including washing, rinsing, drying)?: A Lot Help from another person to put on and taking off regular upper body clothing?: A Little Help from another person to put on and taking off regular lower body clothing?: A Lot 6 Click Score: 15   End of Session Equipment Utilized During Treatment: Gait belt;Rolling walker (2 wheels) Nurse Communication: Mobility status  Activity Tolerance: Patient tolerated treatment well Patient left: in chair;with call bell/phone within reach;with chair alarm set  OT Visit Diagnosis: Other abnormalities of gait and mobility (R26.89);Muscle weakness (generalized) (M62.81);Pain;History of falling (Z91.81) Pain - part of body:  (abdomen)                Time: 3383-2919 OT Time Calculation (min): 24 min Charges:  OT General Charges $OT Visit: 1 Visit OT Evaluation $OT Eval Moderate Complexity: 1 Mod OT Treatments $Self Care/Home Management : 8-22 mins  Natally Ribera H. OTR/L Supplemental OT, Department of rehab services [phone]  Tamala Manzer R H. 07/01/2021, 10:01 AM

## 2021-07-01 NOTE — Progress Notes (Signed)
PROGRESS NOTE                                                                                                                                                                                                             Patient Demographics:    Richard Spears, is a 88 y.o. male, DOB - 1925/09/17, LAG:[id number]  Outpatient Primary MD for the patient is [name], MD    LOS - 7  Admit date - 06/24/2021    Chief Complaint  Patient presents with   GI Problem       Brief Narrative (HPI from H&P)   - 89/M from independent living with spouse with history of paroxysmal atrial fibrillation and mechanical mitral valve replacement with St. Jude's metallic valve on Coumadin, chronic anemia, hypertension, chronic diastolic CHF, presented to the ED with 2 episodes of hematochezia, further work-up showed large colon mass underwent colon resection.  Currently on heparin drip for MVR.   Subjective:    Richard Spears today has, No headache, No chest pain, No abdominal pain - No Nausea, No new weakness tingling or numbness, no shortness of breath.   Assessment  & Plan :     Acute lower GI bleed with colonoscopy done on 06/27/2021 showing large transverse colon mass with acute blood loss related anemia - he received 1 unit of packed RBC transfusion this admission, Coumadin held currently on heparin drip.  Has underwent colonoscopy and has been seen by GI, general surgery on board s/p colon resection on 06/30/2021.  Operative notes reviewed with suspicion for multiple enlarged lymph nodes in intraoperatively.  Have requested oncology to provide opinion.  2.  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  Present on admission.  S/p colon resection see #1 above, bowel activity has not returned fully, requested to advance activity, will monitor closely on liquid diet per surgery.  3.  CKD 3B.  Baseline creatinine around 1.5.   Monitor closely.  4.  Hyponatremia.  Could be dehydration versus SIADH, exam unrevealing, poor oral intake due to surgery, challenge with IV fluids and obtain serum osmolality, urine sodium and osmolality.  May require [place].  5.  MVR.  Coumadin on hold currently on heparin drip.  6.  Paroxysmal A. fib [place] vas 2 score of 3.  See #5 above for anticoagulation.  Condition - Fair  Family Communication  : [name] [phone]  on 07/01/2021  Code Status :  DNR  Consults  : General surgery, GI, Oncology,  PUD Prophylaxis :     Procedures  :     Colonoscopy done on 06/27/2021 showing large transverse colon mass.  Colon resection by general surgery on 06/30/2021      Disposition Plan  :    Status is: Inpatient  Remains inpatient appropriate because: Heparin drip, MVR, colon cancer   DVT Prophylaxis  : Heparin drip  SCDs Start: 06/24/21 2115     Lab Results  Component Value Date   PLT 161 07/01/2021    Diet :  Diet Order             Diet full liquid Room service appropriate? Yes; Fluid consistency: Thin  Diet effective now                    Inpatient Medications  Scheduled Meds:  acetaminophen  1,000 mg Oral Q6H   Or   acetaminophen  650 mg Rectal Q6H   Chlorhexidine Gluconate Cloth  6 each Topical Daily   feeding supplement  1 Container Oral TID BM   sodium chloride flush  3 mL Intravenous Q12H   Continuous Infusions:  heparin 1,200 Units/hr (07/01/21 0635)   PRN Meds:.fentaNYL (SUBLIMAZE) injection, LORazepam, ondansetron (ZOFRAN) IV, oxyCODONE  Antibiotics  :    Anti-infectives (From admission, onward)    None        Time Spent in minutes  30   Lala Lund M.D on 07/01/2021 at 11:47 AM  To page go to [email or web address]   [facility] -  Office  [phone]  See all Orders from today for further details    Objective:   Vitals:   06/30/21 2000 06/30/21 2319 07/01/21 0349 07/01/21 0727  BP: 117/84 (!) 166/75 132/80  134/77  Pulse: (!) 108 (!) 106 (!) 106   Resp: 17 19 18    Temp: 98.1 F (36.7 C) 98.3 F (36.8 C) 98.2 F (36.8 C) 97.8 F (36.6 C)  TempSrc: Oral Axillary Oral Oral  SpO2: 97% 99% 99%   Weight:      Height:        Wt Readings from Last 3 Encounters:  06/27/21 68 kg  04/28/21 68.1 kg  03/06/21 72 kg     Intake/Output Summary (Last 24 hours) at 07/01/2021 1147 Last data filed at 07/01/2021 4650 Gross per 24 hour  Intake 599.73 ml  Output 550 ml  Net 49.73 ml     Physical Exam  Awake Alert, No new F.N deficits, Normal affect [place].AT,PERRAL Supple Neck, No JVD,   Symmetrical Chest wall movement, Good air movement bilaterally, CTAB RRR,No Gallops,Rubs or new Murmurs,  hypoactive bowel sounds, midline infraumbilical abdominal scar stable, large soft chronic left inguinal hernia.   No leg edema         Data Review:    CBC Recent Labs  Lab 06/24/21 1220 06/24/21 2026 06/27/21 0044 06/28/21 0036 06/28/21 2015 06/29/21 0255 06/30/21 0256 06/30/21 1435 07/01/21 0345  WBC 3.7*   < > 3.2* 2.8*  --  3.1* 3.4*  --  8.4  HGB 8.6*   < > 8.6* 7.8* 9.3* 8.8* 9.7* 11.1* 10.1*  HCT 26.7*   < > 26.3* 23.4* 27.3* 27.4* 28.8* 34.2* 30.3*  PLT 164   < > 172 154  --  160 160  --  161  MCV 98.9   < >  97.8 96.3  --  94.8 93.8  --  94.4  MCH 31.9   < > 32.0 32.1  --  30.4 31.6  --  31.5  MCHC 32.2   < > 32.7 33.3  --  32.1 33.7  --  33.3  RDW 16.5*   < > 16.3* 16.3*  --  18.2* 17.8*  --  18.0*  LYMPHSABS 0.6*  --   --   --   --   --   --   --   --   MONOABS 0.5  --   --   --   --   --   --   --   --   EOSABS 0.1  --   --   --   --   --   --   --   --   BASOSABS 0.0  --   --   --   --   --   --   --   --    < > = values in this interval not displayed.    Electrolytes Recent Labs  Lab 06/24/21 1220 06/25/21 0523 06/26/21 0053 06/27/21 0044 06/28/21 0036 06/29/21 0255 06/30/21 0256 07/01/21 0345  NA 132* 135 134* 133* 133* 135 134* 131*  K 4.1 4.3 4.4 4.6 4.0 4.1 4.3  4.8  CL 102 104 105 103 106 105 105 105  CO2 25 26 22 23 22  21* 21* 19*  GLUCOSE 122* 78 89 99 85 88 84 174*  BUN 31* 28* 23 15 10 10 10 20   CREATININE 1.57* 1.52* 1.37* 1.39* 1.38* 1.32* 1.41* 1.49*  CALCIUM 8.4* 8.4* 8.1* 8.3* 7.9* 8.0* 8.3* 8.2*  AST 28  --   --   --   --   --   --  24  ALT 17  --   --   --   --   --   --  12  ALKPHOS 44  --   --   --   --   --   --  48  BILITOT 0.6  --   --   --   --   --   --  0.6  ALBUMIN 2.8*  --   --   --   --   --   --  2.4*  MG  --   --   --   --   --  1.9  --  2.0  INR 3.9* 3.9* 1.7* 1.2  --   --   --   --     ------------------------------------------------------------------------------------------------------------------ No results for input(s): CHOL, HDL, LDLCALC, TRIG, CHOLHDL, LDLDIRECT in the last 72 hours.  No results found for: HGBA1C  No results for input(s): TSH, T4TOTAL, T3FREE, THYROIDAB in the last 72 hours.  Invalid input(s): FREET3 ------------------------------------------------------------------------------------------------------------------ ID Labs Recent Labs  Lab 06/27/21 0044 06/28/21 0036 06/29/21 0255 06/30/21 0256 07/01/21 0345  WBC 3.2* 2.8* 3.1* 3.4* 8.4  PLT 172 154 160 160 161  CREATININE 1.39* 1.38* 1.32* 1.41* 1.49*   Cardiac Enzymes No results for input(s): CKMB, TROPONINI, MYOGLOBIN in the last 168 hours.  Invalid input(s): CK   Radiology Reports No results found.

## 2021-07-02 ENCOUNTER — Encounter (HOSPITAL_COMMUNITY): Payer: Self-pay | Admitting: Family Medicine

## 2021-07-02 ENCOUNTER — Inpatient Hospital Stay (HOSPITAL_COMMUNITY): Payer: Medicare Other

## 2021-07-02 DIAGNOSIS — D696 Thrombocytopenia, unspecified: Secondary | ICD-10-CM

## 2021-07-02 DIAGNOSIS — I480 Paroxysmal atrial fibrillation: Secondary | ICD-10-CM

## 2021-07-02 DIAGNOSIS — N183 Chronic kidney disease, stage 3 unspecified: Secondary | ICD-10-CM | POA: Diagnosis not present

## 2021-07-02 DIAGNOSIS — C184 Malignant neoplasm of transverse colon: Principal | ICD-10-CM

## 2021-07-02 DIAGNOSIS — K922 Gastrointestinal hemorrhage, unspecified: Secondary | ICD-10-CM | POA: Diagnosis not present

## 2021-07-02 DIAGNOSIS — N1832 Chronic kidney disease, stage 3b: Secondary | ICD-10-CM

## 2021-07-02 DIAGNOSIS — D649 Anemia, unspecified: Secondary | ICD-10-CM | POA: Diagnosis not present

## 2021-07-02 DIAGNOSIS — I340 Nonrheumatic mitral (valve) insufficiency: Secondary | ICD-10-CM

## 2021-07-02 LAB — COMPREHENSIVE METABOLIC PANEL
ALT: 8 U/L (ref 0–44)
AST: 22 U/L (ref 15–41)
Albumin: 2.2 g/dL — ABNORMAL LOW (ref 3.5–5.0)
Alkaline Phosphatase: 45 U/L (ref 38–126)
Anion gap: 5 (ref 5–15)
BUN: 24 mg/dL — ABNORMAL HIGH (ref 8–23)
CO2: 22 mmol/L (ref 22–32)
Calcium: 7.9 mg/dL — ABNORMAL LOW (ref 8.9–10.3)
Chloride: 103 mmol/L (ref 98–111)
Creatinine, Ser: 1.26 mg/dL — ABNORMAL HIGH (ref 0.61–1.24)
GFR, Estimated: 53 mL/min — ABNORMAL LOW (ref >60)
Glucose, Bld: 102 mg/dL — ABNORMAL HIGH (ref 70–99)
Potassium: 4.5 mmol/L (ref 3.5–5.1)
Sodium: 130 mmol/L — ABNORMAL LOW (ref 135–145)
Total Bilirubin: 0.8 mg/dL (ref 0.3–1.2)
Total Protein: 5.3 g/dL — ABNORMAL LOW (ref 6.5–8.1)

## 2021-07-02 LAB — CBC WITH DIFFERENTIAL/PLATELET
Abs Immature Granulocytes: 0.04 10*3/uL (ref 0.00–0.07)
Basophils Absolute: 0 10*3/uL (ref 0.0–0.1)
Basophils Relative: 0 %
Eosinophils Absolute: 0.1 10*3/uL (ref 0.0–0.5)
Eosinophils Relative: 1 %
HCT: 27.4 % — ABNORMAL LOW (ref 39.0–52.0)
Hemoglobin: 9.2 g/dL — ABNORMAL LOW (ref 13.0–17.0)
Immature Granulocytes: 1 %
Lymphocytes Relative: 12 %
Lymphs Abs: 0.7 10*3/uL (ref 0.7–4.0)
MCH: 31.8 pg (ref 26.0–34.0)
MCHC: 33.6 g/dL (ref 30.0–36.0)
MCV: 94.8 fL (ref 80.0–100.0)
Monocytes Absolute: 0.4 10*3/uL (ref 0.1–1.0)
Monocytes Relative: 7 %
Neutro Abs: 4.5 10*3/uL (ref 1.7–7.7)
Neutrophils Relative %: 79 %
Platelets: 145 10*3/uL — ABNORMAL LOW (ref 150–400)
RBC: 2.89 MIL/uL — ABNORMAL LOW (ref 4.22–5.81)
RDW: 18.1 % — ABNORMAL HIGH (ref 11.5–15.5)
WBC: 5.7 10*3/uL (ref 4.0–10.5)
nRBC: 0 % (ref 0.0–0.2)

## 2021-07-02 LAB — MAGNESIUM: Magnesium: 1.9 mg/dL (ref 1.7–2.4)

## 2021-07-02 LAB — BRAIN NATRIURETIC PEPTIDE: B Natriuretic Peptide: 270 pg/mL — ABNORMAL HIGH (ref 0.0–100.0)

## 2021-07-02 LAB — PHOSPHORUS: Phosphorus: 2 mg/dL — ABNORMAL LOW (ref 2.5–4.6)

## 2021-07-02 LAB — HEPARIN LEVEL (UNFRACTIONATED)
Heparin Unfractionated: 0.25 IU/mL — ABNORMAL LOW (ref 0.30–0.70)
Heparin Unfractionated: 0.23 IU/mL — ABNORMAL LOW (ref 0.30–0.70)
Heparin Unfractionated: 0.38 IU/mL (ref 0.30–0.70)

## 2021-07-02 MED ORDER — OXYCODONE HCL 5 MG PO TABS
2.5000 mg | ORAL_TABLET | Freq: Four times a day (QID) | ORAL | Status: DC | PRN
  Administered 2021-07-08 – 2021-07-16 (×5): 5 mg via ORAL
  Filled 2021-07-02 (×6): qty 1

## 2021-07-02 MED ORDER — METOPROLOL TARTRATE 25 MG PO TABS
25.0000 mg | ORAL_TABLET | Freq: Two times a day (BID) | ORAL | Status: DC
  Administered 2021-07-02 – 2021-07-17 (×31): 25 mg via ORAL
  Filled 2021-07-02 (×32): qty 1

## 2021-07-02 MED ORDER — LACTATED RINGERS IV SOLN: INTRAVENOUS | Status: DC

## 2021-07-02 MED ORDER — FENTANYL CITRATE PF 50 MCG/ML IJ SOSY: 12.5000 ug | PREFILLED_SYRINGE | INTRAMUSCULAR | Status: DC | PRN

## 2021-07-02 MED ORDER — WARFARIN SODIUM 5 MG PO TABS: 5.0000 mg | ORAL_TABLET | Freq: Once | ORAL | Status: DC

## 2021-07-02 MED ORDER — SODIUM PHOSPHATES 45 MMOLE/15ML IV SOLN
30.0000 mmol | Freq: Once | INTRAVENOUS | Status: AC
  Administered 2021-07-02: 30 mmol via INTRAVENOUS
  Filled 2021-07-02: qty 10

## 2021-07-02 MED ORDER — LACTATED RINGERS IV SOLN: INTRAVENOUS | Status: AC

## 2021-07-02 MED ORDER — WARFARIN - PHARMACIST DOSING INPATIENT: Freq: Every day | Status: DC

## 2021-07-02 NOTE — Progress Notes (Signed)
PROGRESS NOTE                                                                                                                                                                                                             Patient Demographics:    Richard Spears, is a 88 y.o. male, DOB - [birth year], OFB:[id number]  Outpatient Primary MD for the patient is Richard Orn, MD    LOS - 8  Admit date - 06/24/2021    Chief Complaint  Patient presents with   GI Problem       Brief Narrative (HPI from H&P)   - 89/M from independent living with spouse with history of paroxysmal atrial fibrillation and mechanical mitral valve replacement with [employer] Jude's metallic valve on Coumadin, chronic anemia, hypertension, chronic diastolic CHF, presented to the ED with 2 episodes of hematochezia, further work-up showed large colon mass underwent colon resection.  Currently on heparin drip for MVR.   Subjective:   Patient in bed, appears comfortable, denies any headache, no fever, no chest pain or pressure, no shortness of breath , no abdominal pain. No new focal weakness.    Assessment  & Plan :     Acute lower GI bleed with colonoscopy done on 06/27/2021 showing large transverse colon mass with acute blood loss related anemia - he received 1 unit of packed RBC transfusion this admission, Coumadin held currently on heparin drip.  Has underwent colonoscopy and has been seen by GI, general surgery on board s/p colon resection on 06/30/2021.  Operative notes reviewed with suspicion for multiple enlarged lymph nodes in intraoperatively.  Have requested oncology to provide opinion.  2.  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  Present on admission.  S/p colon resection see #1 above, bowel activity has not returned fully, requested to advance activity, will monitor closely on liquid diet per surgery.  3.  CKD 3B.  Baseline creatinine  around 1.5.  Monitor closely.  4.  Hyponatremia.  Could be dehydration versus SIADH, exam unrevealing, poor oral intake due to surgery, continue challenge with IV fluids although urine osmolality was greater than serum but urine sodium was less than 10, marginally better, eventually may require [place]a.  5.  MVR.  Coumadin on hold currently on heparin drip.  Per surgery likely commence Coumadin on 07/03/2021.  6.  Paroxysmal  A. fib [place] vas 2 score of 3.  See #5 above for anticoagulation.  Will place on low-dose Lopressor and monitor.       Condition - Fair  Family Communication  : [name] [phone]  on 07/01/2021  Code Status :  DNR  Consults  : General surgery, GI, Oncology,  PUD Prophylaxis :     Procedures  :     Colonoscopy done on 06/27/2021 showing large transverse colon mass.  Colon resection by general surgery on 06/30/2021      Disposition Plan  :    Status is: Inpatient  Remains inpatient appropriate because: Heparin drip, MVR, colon cancer   DVT Prophylaxis  : Heparin drip  SCDs Start: 06/24/21 2115     Lab Results  Component Value Date   PLT 145 (L) 07/02/2021    Diet :  Diet Order             DIET SOFT Room service appropriate? Yes; Fluid consistency: Thin  Diet effective now                    Inpatient Medications  Scheduled Meds:  acetaminophen  1,000 mg Oral Q6H   Or   acetaminophen  650 mg Rectal Q6H   Chlorhexidine Gluconate Cloth  6 each Topical Daily   feeding supplement  1 Container Oral TID BM   sodium chloride flush  3 mL Intravenous Q12H   Continuous Infusions:  heparin 1,250 Units/hr (07/02/21 0257)   lactated ringers 100 mL/hr at 07/02/21 0929   sodium phosphate  Dextrose 5% IVPB 30 mmol (07/02/21 0935)   PRN Meds:.fentaNYL (SUBLIMAZE) injection, LORazepam, ondansetron (ZOFRAN) IV, oxyCODONE  Antibiotics  :    Anti-infectives (From admission, onward)    None        Time Spent in minutes  30   Lala Lund M.D on 07/02/2021 at 11:00 AM  To page go to [email or web address]   [facility] -  Office  [phone]  See all Orders from today for further details    Objective:   Vitals:   07/01/21 2349 07/02/21 0349 07/02/21 0734 07/02/21 0912  BP: 126/83 126/82 123/61 125/88  Pulse: (!) 101 (!) 104 (!) 120 (!) 120  Resp: 16 16 13 17   Temp: 98.1 F (36.7 C) 97.7 F (36.5 C) 98.3 F (36.8 C) 97.9 F (36.6 C)  TempSrc: Oral Oral Oral Oral  SpO2: 97% 98% 98% 98%  Weight:      Height:        Wt Readings from Last 3 Encounters:  06/27/21 68 kg  04/28/21 68.1 kg  03/06/21 72 kg     Intake/Output Summary (Last 24 hours) at 07/02/2021 1100 Last data filed at 07/02/2021 0455 Gross per 24 hour  Intake 2070.39 ml  Output 1350 ml  Net 720.39 ml     Physical Exam  Awake Alert, No new F.N deficits, Normal affect [place].AT,PERRAL Supple Neck, No JVD,   Symmetrical Chest wall movement, Good air movement bilaterally, CTAB RRR,No Gallops,Rubs or new Murmurs,  hypoactive bowel sounds, midline infraumbilical abdominal scar stable, large soft chronic left inguinal hernia.   No leg edema         Data Review:    CBC Recent Labs  Lab 06/28/21 0036 06/28/21 2015 06/29/21 0255 06/30/21 0256 06/30/21 1435 07/01/21 0345 07/02/21 0140  WBC 2.8*  --  3.1* 3.4*  --  8.4 5.7  HGB 7.8*   < > 8.8* 9.7* 11.1* 10.1*  9.2*  HCT 23.4*   < > 27.4* 28.8* 34.2* 30.3* 27.4*  PLT 154  --  160 160  --  161 145*  MCV 96.3  --  94.8 93.8  --  94.4 94.8  MCH 32.1  --  30.4 31.6  --  31.5 31.8  MCHC 33.3  --  32.1 33.7  --  33.3 33.6  RDW 16.3*  --  18.2* 17.8*  --  18.0* 18.1*  LYMPHSABS  --   --   --   --   --   --  0.7  MONOABS  --   --   --   --   --   --  0.4  EOSABS  --   --   --   --   --   --  0.1  BASOSABS  --   --   --   --   --   --  0.0   < > = values in this interval not displayed.    Electrolytes Recent Labs  Lab 06/26/21 0053 06/27/21 0044 06/28/21 0036 06/29/21 0255  06/30/21 0256 07/01/21 0345 07/02/21 0140  NA 134* 133* 133* 135 134* 131* 130*  K 4.4 4.6 4.0 4.1 4.3 4.8 4.5  CL 105 103 106 105 105 105 103  CO2 22 23 22  21* 21* 19* 22  GLUCOSE 89 99 85 88 84 174* 102*  BUN 23 15 10 10 10 20  24*  CREATININE 1.37* 1.39* 1.38* 1.32* 1.41* 1.49* 1.26*  CALCIUM 8.1* 8.3* 7.9* 8.0* 8.3* 8.2* 7.9*  AST  --   --   --   --   --  24 22  ALT  --   --   --   --   --  12 8  ALKPHOS  --   --   --   --   --  48 45  BILITOT  --   --   --   --   --  0.6 0.8  ALBUMIN  --   --   --   --   --  2.4* 2.2*  MG  --   --   --  1.9  --  2.0 1.9  INR 1.7* 1.2  --   --   --   --   --   BNP  --   --   --   --   --   --  270.0*    ------------------------------------------------------------------------------------------------------------------ No results for input(s): CHOL, HDL, LDLCALC, TRIG, CHOLHDL, LDLDIRECT in the last 72 hours.  No results found for: HGBA1C  No results for input(s): TSH, T4TOTAL, T3FREE, THYROIDAB in the last 72 hours.  Invalid input(s): FREET3 ------------------------------------------------------------------------------------------------------------------ ID Labs Recent Labs  Lab 06/28/21 0036 06/29/21 0255 06/30/21 0256 07/01/21 0345 07/02/21 0140  WBC 2.8* 3.1* 3.4* 8.4 5.7  PLT 154 160 160 161 145*  CREATININE 1.38* 1.32* 1.41* 1.49* 1.26*   Cardiac Enzymes No results for input(s): CKMB, TROPONINI, MYOGLOBIN in the last 168 hours.  Invalid input(s): CK   Radiology Reports No results found.

## 2021-07-02 NOTE — Progress Notes (Signed)
[place] for Heparin Indication:  mechanical mitral valve and atrial fibrillation  Allergies  Allergen Reactions   Flexeril [Cyclobenzaprine] Diarrhea    Patient Measurements: Height: 6' (182.9 cm) Weight: 68 kg (149 lb 14.6 oz) IBW/kg (Calculated) : 77.6  Heparin Dosing Weight: 68 kg  Vital Signs: Temp: 97.9 F (36.6 C) (01/05 0912) Temp Source: Oral (01/05 0912) BP: 125/88 (01/05 0912) Pulse Rate: 120 (01/05 0912)  Labs: Recent Labs    06/30/21 0256 06/30/21 1435 07/01/21 0345 07/01/21 1208 07/02/21 0140  HGB 9.7* 11.1* 10.1*  --  9.2*  HCT 28.8* 34.2* 30.3*  --  27.4*  PLT 160  --  161  --  145*  HEPARINUNFRC 0.36  --  0.35 0.43 0.25*  CREATININE 1.41*  --  1.49*  --  1.26*     Estimated Creatinine Clearance: 35.2 mL/min (A) (by C-G formula based on SCr of 1.26 mg/dL (H)).   Assessment: 88 yo M admitted with GIB on 12/28. PTA warfarin (LD 12/28 @ 0730) for mechanical mitral valve and atrial fibrillation. INR 3.9 on admission > IV vitamin k 5mg  12/29 > INR 1.2 on recheck. Pharmacy consulted to start IV heparin while warfarin is on hold.  [facility] 12/14 regimen: warfarin 5mg  daily except 2.5mg  M/F, INR goal 2.5-3.5    Heparin infusion was held on 01/03 for procedure. Heparin infusion resumed at 2000 post-op. Heparin level was low on 1200 units/hr and increased overnight to 1250 units/hr. Repeat heparin level also subtherapeutic at 0.23. Will increase heparin infusion to 1400 units/hr and recheck heparin level in 8 hours. CBC stable, no s/sx bleeding reported, no heparin infusion issues per RN.    Goal of Therapy:  Heparin level 0.3-0.5 units/ml Monitor platelets by anticoagulation protocol: Yes   Plan:   Planning to restart warfarin 01/06 Increase heparin infusion to 1400 units/hr Check heparin level daily while on heparin Continue to monitor H&H and platelets    Thank you for allowing pharmacy to be a part of this  patients care.  Ardyth Harps, PharmD Clinical Pharmacist

## 2021-07-02 NOTE — Progress Notes (Signed)
Left for CT scan via bed. No s/s of distress noted. Transport staff in route with patient to CT.

## 2021-07-02 NOTE — Progress Notes (Signed)
2 Days Post-Op   Subjective/Chief Complaint: Feeling well. Pain well controlled. NO nausea or bloating.  + flatus, but no BM yet.  Tolerating FLD.  Sat up in chair for several hours yesterday.    Objective: Vital signs in last 24 hours: Temp:  [97.7 F (36.5 C)-98.3 F (36.8 C)] 97.9 F (36.6 C) (01/05 0912) Pulse Rate:  [98-120] 120 (01/05 0912) Resp:  [13-20] 17 (01/05 0912) BP: (121-127)/(61-88) 125/88 (01/05 0912) SpO2:  [94 %-99 %] 98 % (01/05 0912) Last BM Date: 06/27/21  Intake/Output from previous day: 01/04 0701 - 01/05 0700 In: 2070.4 [P.O.:240; I.V.:1830.4] Out: 1350 [Urine:1350] Intake/Output this shift: No intake/output data recorded.  Exam: Awake and alert Abdomen soft, nd, minimally tender. OR dressing c/d, incision intact with staples, no cellulitis or hematoma  Lab Results:  Recent Labs    07/01/21 0345 07/02/21 0140  WBC 8.4 5.7  HGB 10.1* 9.2*  HCT 30.3* 27.4*  PLT 161 145*   BMET Recent Labs    07/01/21 0345 07/02/21 0140  NA 131* 130*  K 4.8 4.5  CL 105 103  CO2 19* 22  GLUCOSE 174* 102*  BUN 20 24*  CREATININE 1.49* 1.26*  CALCIUM 8.2* 7.9*   PT/INR No results for input(s): LABPROT, INR in the last 72 hours.  ABG No results for input(s): PHART, HCO3 in the last 72 hours.  Invalid input(s): PCO2, PO2  Studies/Results: No results found.  Anti-infectives: Anti-infectives (From admission, onward)    None       Assessment/Plan: POD 2, s/p ex lap with segmental TC resection for adenocarcinoma, Dr. Kae Heller 1/3 -DC foley today -continue mobilization -adv to soft diet -multi-modal pain control -oncology to eval -given heparin gtt one more day.  Would ideally like for him to have a BM prior to transition, but if none by tomorrow, can DC gtt and restart coumadin tomorrow.  FEN - soft diet VTE - heparin gtt, see above ID - none currently  A fib MVR CKD  Richard Spears 07/02/2021

## 2021-07-02 NOTE — Consult Note (Addendum)
[place]  [phone] [phone]   MEDICAL ONCOLOGY - INITIAL CONSULTATION  Referral MD: Dr. Lala Lund  Reason for Referral: Poorly differentiated adenocarcinoma of the colon-surgical path report pending  HPI: Richard Spears is a 88 year old male with a past medical history significant for atrial fibrillation and mechanical valve replacement on warfarin, hypertension, renal insufficiency, HFpEF, anemia.  He presented to the emergency department for evaluation of blood in his stool.  He was passing dark liquid stools and then developed some bright red blood.  Admission lab work showed a WBC of 3.7, hemoglobin 8.6, BUN 31, creatinine 1.57, albumin 2.8.  CT angio GI bleed was performed on admission which showed a masslike filling defect in the mid transverse colon.  There was no mention of metastatic disease on the CT scan.  He underwent a colonoscopy on 06/27/2021 which showed ulcerated partially obstructing large mass in the mid transverse colon.  Biopsy of this mass was consistent with poorly differentiated adenocarcinoma.  He has been seen by general surgery and underwent partial transverse colectomy on 06/30/2021.  Surgical pathology report is currently pending.  A CEA was obtained preoperatively on 06/28/2021 and was normal at 4.0.  He reports that prior to admission, he did not notice much change in his appetite or weight loss.  Abdominal pain is very minimal at this time.  He is not having any nausea or vomiting.  Bowels have not moved since surgery, but is passing flatus.  He is not having any fevers, chills, chest pain, shortness of breath.  The patient is married.  [facility].  He states that he was in short-term rehab recently following a fracture of his left femur.  He is independent with ADLs.  He has been able to ambulate with a rolling walker.  He has 2 sons, 1 deceased from complications following renal  transplant for lupus.  His living son currently lives in [place], [place].  He has a remote history of tobacco use but quit in his mid 40s.  He currently drinks alcohol daily.  He had a sister who died from cancer in about 1993/09/23, he is not sure what type of cancer she had-possibly GI primary.  Medical oncology was asked to see the patient for recommendations regarding his newly diagnosed colon cancer.   Past Medical History:  Diagnosis Date   Anemia    leakoppenia   BPH (benign prostatic hypertrophy)    Bullous pemphigoid    [name], 09-23-09, right forearm squamous cell carcinoma   Chronic anticoagulation    systemic   Colon polyp    transverse, 2000/09/23   History of peptic ulcer    remote, 09/23/93   Hx of actinic keratosis    Hx of basal cell carcinoma    Hx of squamous cell carcinoma of skin    Hyperlipidemia    Left inguinal hernia    Moderate aortic insufficiency 09/24/07   audible aortic insufficiency on 1/09 echo   PAF (paroxysmal atrial fibrillation) ([facility]) 01/17/2014   On Warfarin.   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 Jude mechanical valve   ORIF FEMUR FRACTURE Left 08/28/2020   Procedure: OPEN REDUCTION INTERNAL FIXATION (ORIF) DISTAL FEMUR FRACTURE;  Surgeon: Rod Can, MD;  Location: [facility];   Service: Orthopedics;  Laterality: Left;   PARTIAL COLECTOMY N/A 06/30/2021   Procedure: PARTIAL COLECTOMY;  Surgeon: Clovis Riley, MD;  Location: [facility];  Service: General;  Laterality: N/A;   TOTAL HIP ARTHROPLASTY Right 10/12/2017   Procedure: RIGHT TOTAL HIP ARTHROPLASTY ANTERIOR APPROACH;  Surgeon: Gaynelle Arabian, MD;  Location: [facility];  Service: Orthopedics;  Laterality: Right;   TRANSTHORACIC ECHOCARDIOGRAM  12/2018   Unable to assess diastolic function because of A. fib. Normal RV function, but moderately elevated RVSP.  Severe biatrial enlargement. S/P St Jude bileaflet mechanical MVR that appears to be functioning normally. Mitral valve regurgitation cannot assess due to mechanical valve shadowing. MV Mean grad: 7.0 mmHg MV Area (PHT): 3.38 cm (stable for valve).  Mild Ao Sclerosis, Mild-Mod AI   TRANSTHORACIC ECHOCARDIOGRAM  08/'17; 10/'18    a) Mild conc LVH. EF 55-60%. No RWMA. Mod AI. Mechanical MV prosthesis functioning properly. LAD dilation.;; b)  EF 55-60%.  Mo AI.  Bileaflet Saint Jude mechanical MV with no paravalvular leak.  Severe LA dilation.  Minimally elevated PAP  :   Current Facility-Administered Medications  Medication Dose Route Frequency Provider Last Rate Last Admin   acetaminophen (TYLENOL) tablet 1,000 mg  1,000 mg Oral Q6H Kae Heller, Chelsea A, MD   1,000 mg at 07/02/21 1028   Or   acetaminophen (TYLENOL) suppository 650 mg  650 mg Rectal Q6H Clovis Riley, MD       Chlorhexidine Gluconate Cloth 2 % PADS 6 each  6 each Topical Daily [name], MD   6 each at 07/02/21 0930   feeding supplement (BOOST / RESOURCE BREEZE) liquid 1 Container  1 Container Oral TID BM Clovis Riley, MD   1 Container at 07/02/21 0930   fentaNYL (SUBLIMAZE) injection 12.5-25 mcg  12.5-25 mcg Intravenous Q2H PRN Romana Juniper A, MD       heparin ADULT infusion 100 units/mL (25000 units/250mL)  1,250 Units/hr Intravenous Continuous Lavenia Atlas, RPH 12.5 mL/hr at  07/02/21 0257 1,250 Units/hr at 07/02/21 0257   lactated ringers infusion   Intravenous Continuous Thurnell Lose, MD 100 mL/hr at 07/02/21 0929 New Bag at 07/02/21 0929   LORazepam (ATIVAN) tablet 0.5 mg  0.5 mg Oral Daily PRN Romana Juniper A, MD   0.5 mg at 07/01/21 2216   ondansetron (ZOFRAN) injection 4 mg  4 mg Intravenous Q6H PRN Clovis Riley, MD       oxyCODONE (Oxy IR/ROXICODONE) immediate release tablet 2.5-5 mg  2.5-5 mg Oral Q4H PRN Romana Juniper A, MD       sodium chloride flush (NS) 0.9 % injection 3 mL  3 mL Intravenous Q12H Romana Juniper A, MD   3 mL at 07/02/21 0930   sodium phosphate 30 mmol in dextrose 5 % 250 mL infusion  30 mmol Intravenous Once Thurnell Lose, MD 43 mL/hr at 07/02/21 0935 30 mmol at 07/02/21 0935  Allergies  Allergen Reactions   Flexeril [Cyclobenzaprine] Diarrhea  :   Family History  Problem Relation Age of Onset   Hypertension Mother    Lung cancer Sister    COPD Brother    Cancer Brother    Other Sister        polio   Lupus Son   :   Social History   Socioeconomic History   Marital status: Married    Spouse name: Not on file   Number of children: 2   Years of education: Not on file   Highest education level: Not on file  Occupational History   Occupation: retired  Tobacco Use   Smoking status: Former    Packs/day: 1.00    Years: 13.00    Pack years: 13.00    Types: Cigarettes    Start date: 1947    Quit date: 1960    Years since quitting: 63.0   Smokeless tobacco: Never  Vaping Use   Vaping Use: Never used  Substance and Sexual Activity   Alcohol use: Yes    Alcohol/week: 0.0 standard drinks    Comment: 1-2 drinks per day   Drug use: Never   Sexual activity: Not on file  Other Topics Concern   Not on file  Social History Narrative   Patient lives at [facility], With his wife - Cordelia Pen.   Social Determinants of Health   Financial Resource Strain: Not on file  Food Insecurity: Not on  file  Transportation Needs: Not on file  Physical Activity: Not on file  Stress: Not on file  Social Connections: Not on file  Intimate Partner Violence: Not on file  :  Review of Systems: A comprehensive 14 point review of systems was negative except as noted in the HPI.  Exam: Patient Vitals for the past 24 hrs:  BP Temp Temp src Pulse Resp SpO2  07/02/21 0912 125/88 97.9 F (36.6 C) Oral (!) 120 17 98 %  07/02/21 0734 123/61 98.3 F (36.8 C) Oral (!) 120 13 98 %  07/02/21 0349 126/82 97.7 F (36.5 C) Oral (!) 104 16 98 %  07/01/21 2349 126/83 98.1 F (36.7 C) Oral (!) 101 16 97 %  07/01/21 2000 121/87 98.3 F (36.8 C) Oral 98 14 99 %  07/01/21 1634 123/79 -- -- 100 -- 94 %  07/01/21 1147 127/67 97.9 F (36.6 C) Oral 100 20 94 %    General: Awake and alert, no distress. Eyes:  no scleral icterus.   ENT:  There were no oropharyngeal lesions.     Lymphatics:  Negative cervical, supraclavicular or axillary adenopathy.   Respiratory: lungs were clear bilaterally without wheezing or crackles.   Cardiovascular: Regular rate and rhythm with audible click.  There was no pedal edema.  Lower extremities wrapped with Ace bandages. GI: Positive bowel sounds, soft, minimal tenderness with palpation, dressing clean dry and intact. Skin: No rashes Neuro exam was nonfocal. Patient was alert and oriented.  Attention was good.   Language was appropriate.  Mood was normal without depression.  Speech was not pressured.  Thought content was not tangential.     Lab Results  Component Value Date   WBC 5.7 07/02/2021   HGB 9.2 (L) 07/02/2021   HCT 27.4 (L) 07/02/2021   PLT 145 (L) 07/02/2021   GLUCOSE 102 (H) 07/02/2021   ALT 8 07/02/2021   AST 22 07/02/2021   NA 130 (L) 07/02/2021   K 4.5 07/02/2021  CL 103 07/02/2021   CREATININE 1.26 (H) 07/02/2021   BUN 24 (H) 07/02/2021   CO2 22 07/02/2021    CT ANGIO GI BLEED  Result Date: 06/24/2021 CLINICAL DATA:  Evaluate for lower GI  tract bleed. EXAM: CTA ABDOMEN AND PELVIS WITHOUT AND WITH CONTRAST TECHNIQUE: Multidetector CT imaging of the abdomen and pelvis was performed using the standard protocol during bolus administration of intravenous contrast. Multiplanar reconstructed images and MIPs were obtained and reviewed to evaluate the vascular anatomy. CONTRAST:  67mL OMNIPAQUE IOHEXOL 350 MG/ML SOLN COMPARISON:  12/15/20 FINDINGS: VASCULAR Aorta: Normal caliber aorta without aneurysm, dissection, vasculitis or significant stenosis. Aortic atherosclerotic calcifications. None Celiac: Calcified plaque with approximately 50% stenosis at the origin of the celiac artery noted. SMA: Calcified plaque with approximately 60% stenosis at the origin of the SMA. Renals: Calcified plaque at the origin of both renal arteries results in greater than 50% stenosis bilaterally. IMA: Appears patent with greater than 50% stenosis at the origin. Inflow: Patent without evidence of aneurysm, dissection, vasculitis or significant stenosis. Proximal Outflow: Bilateral common femoral and visualized portions of the superficial and profunda femoral arteries are patent. There is non flow limiting dissection involving the right external iliac artery, image 601 through image 663/11. Veins: No obvious venous abnormality within the limitations of this arterial phase study. Review of the MIP images confirms the above findings. NON-VASCULAR Lower chest: Cardiac enlargement.  Lung bases are clear. Hepatobiliary: No acute abnormality. Low-density structure within segment 8 of the liver is technically too small to characterize measuring 7 mm, image 29/17. Gallbladder appears normal. No bile duct dilatation. Pancreas: Unremarkable. No pancreatic ductal dilatation or surrounding inflammatory changes. Spleen: Normal in size without focal abnormality. Adrenals/Urinary Tract: Normal adrenal glands. No kidney stones or obstructive uropathy identified. Several subcentimeter low-density  foci are identified within the right renal cortex which are technically too small to characterize measuring less than 1 cm. Urinary bladder is largely obscured by beam hardening artifact from patient's bilateral hip arthroplasty devices. Stomach/Bowel: Within the mid transverse colon there is a masslike intraluminal filling defect with avid arterial phase enhancement measuring 4.1 by 3.7 by 3.5 cm, image 42/13 and image 90/5. No additional abnormal areas of intraluminal contrast enhancement identified. No bowel wall thickening, inflammation or distension. Lymphatic: No enlarged lymph nodes. Reproductive: Prostate gland is largely obscured by streak artifact from bilateral hip arthroplasty devices. Other: Large left inguinal hernia contains a nonobstructed loop of large bowel. Musculoskeletal: Status post bilateral hip arthroplasty. Scoliosis and degenerative disc disease is identified. IMPRESSION: 1. Avid arterial phase enhancing masslike filling defect within the mid transverse colon is identified. Although conceivably this could represent a large blood clot the diagnosis of exclusion is a primary colonic neoplasm. Further evaluation with colonoscopy is recommended. 2. Extensive atherosclerotic disease noted with stenosis noted at the origin of the celiac artery, SMA, IMA and both renal arteries. 3. 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 4. Large left inguinal hernia contains a nonobstructed loop of large bowel. 5. Aortic Atherosclerosis (ICD10-I70.0). These results were called by telephone at the time of interpretation on 06/24/2021 at 9:47 pm to provider [facility] , who verbally acknowledged these results. Electronically Signed   By: Kerby Moors M.D.   On: 06/24/2021 21:48     CT ANGIO GI BLEED  Result Date: 06/24/2021 CLINICAL DATA:  Evaluate for lower GI tract bleed. EXAM: CTA ABDOMEN AND PELVIS WITHOUT AND WITH CONTRAST TECHNIQUE: Multidetector CT imaging of the  abdomen and  pelvis was performed using the standard protocol during bolus administration of intravenous contrast. Multiplanar reconstructed images and MIPs were obtained and reviewed to evaluate the vascular anatomy. CONTRAST:  68mL OMNIPAQUE IOHEXOL 350 MG/ML SOLN COMPARISON:  12/15/20 FINDINGS: VASCULAR Aorta: Normal caliber aorta without aneurysm, dissection, vasculitis or significant stenosis. Aortic atherosclerotic calcifications. None Celiac: Calcified plaque with approximately 50% stenosis at the origin of the celiac artery noted. SMA: Calcified plaque with approximately 60% stenosis at the origin of the SMA. Renals: Calcified plaque at the origin of both renal arteries results in greater than 50% stenosis bilaterally. IMA: Appears patent with greater than 50% stenosis at the origin. Inflow: Patent without evidence of aneurysm, dissection, vasculitis or significant stenosis. Proximal Outflow: Bilateral common femoral and visualized portions of the superficial and profunda femoral arteries are patent. There is non flow limiting dissection involving the right external iliac artery, image 601 through image 663/11. Veins: No obvious venous abnormality within the limitations of this arterial phase study. Review of the MIP images confirms the above findings. NON-VASCULAR Lower chest: Cardiac enlargement.  Lung bases are clear. Hepatobiliary: No acute abnormality. Low-density structure within segment 8 of the liver is technically too small to characterize measuring 7 mm, image 29/17. Gallbladder appears normal. No bile duct dilatation. Pancreas: Unremarkable. No pancreatic ductal dilatation or surrounding inflammatory changes. Spleen: Normal in size without focal abnormality. Adrenals/Urinary Tract: Normal adrenal glands. No kidney stones or obstructive uropathy identified. Several subcentimeter low-density foci are identified within the right renal cortex which are technically too small to characterize measuring  less than 1 cm. Urinary bladder is largely obscured by beam hardening artifact from patient's bilateral hip arthroplasty devices. Stomach/Bowel: Within the mid transverse colon there is a masslike intraluminal filling defect with avid arterial phase enhancement measuring 4.1 by 3.7 by 3.5 cm, image 42/13 and image 90/5. No additional abnormal areas of intraluminal contrast enhancement identified. No bowel wall thickening, inflammation or distension. Lymphatic: No enlarged lymph nodes. Reproductive: Prostate gland is largely obscured by streak artifact from bilateral hip arthroplasty devices. Other: Large left inguinal hernia contains a nonobstructed loop of large bowel. Musculoskeletal: Status post bilateral hip arthroplasty. Scoliosis and degenerative disc disease is identified. IMPRESSION: 1. Avid arterial phase enhancing masslike filling defect within the mid transverse colon is identified. Although conceivably this could represent a large blood clot the diagnosis of exclusion is a primary colonic neoplasm. Further evaluation with colonoscopy is recommended. 2. Extensive atherosclerotic disease noted with stenosis noted at the origin of the celiac artery, SMA, IMA and both renal arteries. 3. 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 4. Large left inguinal hernia contains a nonobstructed loop of large bowel. 5. Aortic Atherosclerosis (ICD10-I70.0). These results were called by telephone at the time of interpretation on 06/24/2021 at 9:47 pm to provider [facility] , who verbally acknowledged these results. Electronically Signed   By: Signa Kell M.D.   On: 06/24/2021 21:48    Pathology:  SURGICAL PATHOLOGY  CASE: [id number]  PATIENT: Richard Spears  Surgical Pathology Report   Clinical History: Transverse colon mass, rectal bleeding, was on  warfarin, MVR   FINAL MICROSCOPIC DIAGNOSIS:   A. TRANSVERSE COLON, BIOPSY:  - Poorly differentiated adenocarcinoma, see  comment   COMMENT:   Dr. Charm Barges reviewed the case and concurs with the diagnosis.  Dr. Ewing Schlein  was paged on 07/01/2021.  Immunohistochemical stains for MMR-related  proteins are pending and will be reported in an addendum.   Assessment and Plan:  1.  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  Normocytic anemia 3.  Mild thrombocytopenia 4.  CKD stage IIIb 5.  Paroxysmal atrial fibrillation 6.  MVR  -Discussed biopsy result from his colonoscopy with the patient which confirmed malignancy.  However, awaiting surgical pathology report to gain more information regarding staging and treatment options.  Would recommend CT chest to complete his initial staging work-up.  Further treatment recommendations pending surgical pathology report and CT chest. -Recommend checking ferritin and iron studies with next lab draw given recent acute blood loss anemia.  May benefit from oral or IV iron if iron deficient. -Patient to transition from heparin back to warfarin per general surgery on 07/03/21.  Thank you for this referral.   Mikey Bussing, DNP, AGPCNP-BC, AOCNP   Addendum  I have seen the patient, examined him. I agree with the assessment and and plan and have edited the notes.   88 year old male with past medical history of atrial fibrillation and mechanical valve replacement, on warfarin, hypertension, stage III CKD, anemia, presented with hematochezia.  Work-up including colonoscopy showed a mass in the distal transverse colon.  Biopsy confirmed adenocarcinoma.  He has underwent segmental colectomy, surgical path is still pending.  CEA preop was normal.  Staging CT AP scan was negative for distant metastasis. Will get CT chest wo contrast to complete staging.  I discussed the risk of cancer recurrence in the future. I discussed the surveillance plan, which is a physical exam and lab test (including CBC, CMP and CEA) every 3 months for the first 2 years, then every 6-12 months,  colonoscopy in one year, and surveilliance CT scan every 6-12 month for up to 5 year.  I discussed the benefit of adjuvant chemotherapy for stage III colon cancer.  But the patient is 88 year old with multiple comorbidities, I do not think he is a candidate for adjuvant chemotherapy, including single agent Xeloda (due to his CKD). I would recommend surveillance even if he had stage III colon cancer. All questions were answered.  I plan to see him back in 4 to 8 weeks for follow up.  [facility], may need transportation assistance for office visits during the first few months after surgery.  [name]  07/02/2021

## 2021-07-02 NOTE — Progress Notes (Signed)
Transition of Care Department [facility]) has reviewed patient. We will continue to monitor patient advancement through interdisciplinary progression rounds and therapy recommendation as he progresses.

## 2021-07-02 NOTE — Progress Notes (Signed)
Patient back from CT. No s/s of pain or distress. Patient has had incontinent episode. Cleaned and sheets changed. No further needs at this time.

## 2021-07-02 NOTE — Progress Notes (Signed)
ANTICOAGULATION CONSULT NOTE  Pharmacy Consult for Heparin Indication:  mechanical mitral valve and atrial fibrillation  Allergies  Allergen Reactions   Flexeril [Cyclobenzaprine] Diarrhea    Patient Measurements: Height: 6' (182.9 cm) Weight: 68 kg (149 lb 14.6 oz) IBW/kg (Calculated) : 77.6  Heparin Dosing Weight: 68 kg  Vital Signs: Temp: 98.1 F (36.7 C) (01/04 2349) Temp Source: Oral (01/04 2349) BP: 126/83 (01/04 2349) Pulse Rate: 101 (01/04 2349)  Labs: Recent Labs    06/29/21 0255 06/29/21 1844 06/30/21 0256 06/30/21 1435 07/01/21 0345 07/01/21 1208 07/02/21 0140  HGB 8.8*  --  9.7* 11.1* 10.1*  --  9.2*  HCT 27.4*  --  28.8* 34.2* 30.3*  --  27.4*  PLT 160  --  160  --  161  --  145*  HEPARINUNFRC  --    < > 0.36  --  0.35 0.43 0.25*  CREATININE 1.32*  --  1.41*  --  1.49*  --   --    < > = values in this interval not displayed.     Estimated Creatinine Clearance: 29.8 mL/min (A) (by C-G formula based on SCr of 1.49 mg/dL (H)).   Assessment: 88 yo M admitted with GIB on 12/28. PTA warfarin (LD 12/28 @ 0730) for mechanical mitral valve and atrial fibrillation. INR 3.9 on admission > IV vitamin k 5mg  12/29 > INR 1.2 on recheck. Pharmacy consulted to start IV heparin while warfarin is on hold.  PTA warfarin 5.5mg  daily, INR goal 2.5-3.5    S/p partial colectomy on 1/3. Heparin level is subtherapeutic this AM on 1200 units/hr. RN reports increased drainage from incision site but no overt s/s of bleeding    Goal of Therapy:  Heparin level 0.3-0.5 units/ml Monitor platelets by anticoagulation protocol: Yes   Plan:  Increase heparin infusion to 1250 units/hr F/u 8 hr HL  Check heparin level daily while on heparin Continue to monitor H&H and platelets F/u plans for restarting warfarin    Albertina Parr, PharmD., BCPS, BCCCP Clinical Pharmacist Please refer to [facility] for unit-specific pharmacist

## 2021-07-03 ENCOUNTER — Encounter: Payer: Self-pay | Admitting: Hematology

## 2021-07-03 ENCOUNTER — Telehealth: Payer: Self-pay | Admitting: Hematology

## 2021-07-03 ENCOUNTER — Telehealth: Payer: Self-pay

## 2021-07-03 DIAGNOSIS — D62 Acute posthemorrhagic anemia: Secondary | ICD-10-CM | POA: Diagnosis not present

## 2021-07-03 DIAGNOSIS — C184 Malignant neoplasm of transverse colon: Secondary | ICD-10-CM | POA: Insufficient documentation

## 2021-07-03 LAB — IRON AND TIBC
Iron: 41 ug/dL — ABNORMAL LOW (ref 45–182)
Saturation Ratios: 18 % (ref 17.9–39.5)
TIBC: 223 ug/dL — ABNORMAL LOW (ref 250–450)
UIBC: 182 ug/dL

## 2021-07-03 LAB — COMPREHENSIVE METABOLIC PANEL
ALT: 11 U/L (ref 0–44)
AST: 20 U/L (ref 15–41)
Albumin: 2 g/dL — ABNORMAL LOW (ref 3.5–5.0)
Alkaline Phosphatase: 43 U/L (ref 38–126)
Anion gap: 5 (ref 5–15)
BUN: 23 mg/dL (ref 8–23)
CO2: 23 mmol/L (ref 22–32)
Calcium: 7.8 mg/dL — ABNORMAL LOW (ref 8.9–10.3)
Chloride: 102 mmol/L (ref 98–111)
Creatinine, Ser: 1.18 mg/dL (ref 0.61–1.24)
GFR, Estimated: 58 mL/min — ABNORMAL LOW (ref >60)
Glucose, Bld: 87 mg/dL (ref 70–99)
Potassium: 4.5 mmol/L (ref 3.5–5.1)
Sodium: 130 mmol/L — ABNORMAL LOW (ref 135–145)
Total Bilirubin: 0.5 mg/dL (ref 0.3–1.2)
Total Protein: 4.8 g/dL — ABNORMAL LOW (ref 6.5–8.1)

## 2021-07-03 LAB — CBC
HCT: 23.5 % — ABNORMAL LOW (ref 39.0–52.0)
Hemoglobin: 7.6 g/dL — ABNORMAL LOW (ref 13.0–17.0)
MCH: 31.1 pg (ref 26.0–34.0)
MCHC: 32.3 g/dL (ref 30.0–36.0)
MCV: 96.3 fL (ref 80.0–100.0)
Platelets: 162 10*3/uL (ref 150–400)
RBC: 2.44 MIL/uL — ABNORMAL LOW (ref 4.22–5.81)
RDW: 18.3 % — ABNORMAL HIGH (ref 11.5–15.5)
WBC: 4.9 10*3/uL (ref 4.0–10.5)
nRBC: 0.4 % — ABNORMAL HIGH (ref 0.0–0.2)

## 2021-07-03 LAB — CBC WITH DIFFERENTIAL/PLATELET
Abs Immature Granulocytes: 0.03 10*3/uL (ref 0.00–0.07)
Basophils Absolute: 0 10*3/uL (ref 0.0–0.1)
Basophils Relative: 0 %
Eosinophils Absolute: 0.2 10*3/uL (ref 0.0–0.5)
Eosinophils Relative: 4 %
HCT: 23 % — ABNORMAL LOW (ref 39.0–52.0)
Hemoglobin: 7.6 g/dL — ABNORMAL LOW (ref 13.0–17.0)
Immature Granulocytes: 1 %
Lymphocytes Relative: 15 %
Lymphs Abs: 0.8 10*3/uL (ref 0.7–4.0)
MCH: 31.5 pg (ref 26.0–34.0)
MCHC: 33 g/dL (ref 30.0–36.0)
MCV: 95.4 fL (ref 80.0–100.0)
Monocytes Absolute: 0.3 10*3/uL (ref 0.1–1.0)
Monocytes Relative: 7 %
Neutro Abs: 3.7 10*3/uL (ref 1.7–7.7)
Neutrophils Relative %: 73 %
Platelets: 133 10*3/uL — ABNORMAL LOW (ref 150–400)
RBC: 2.41 MIL/uL — ABNORMAL LOW (ref 4.22–5.81)
RDW: 18.3 % — ABNORMAL HIGH (ref 11.5–15.5)
WBC: 5 10*3/uL (ref 4.0–10.5)
nRBC: 0 % (ref 0.0–0.2)

## 2021-07-03 LAB — MAGNESIUM: Magnesium: 1.8 mg/dL (ref 1.7–2.4)

## 2021-07-03 LAB — HEPARIN LEVEL (UNFRACTIONATED): Heparin Unfractionated: 0.4 IU/mL (ref 0.30–0.70)

## 2021-07-03 LAB — PHOSPHORUS: Phosphorus: 2.5 mg/dL (ref 2.5–4.6)

## 2021-07-03 LAB — BRAIN NATRIURETIC PEPTIDE: B Natriuretic Peptide: 582.9 pg/mL — ABNORMAL HIGH (ref 0.0–100.0)

## 2021-07-03 LAB — FERRITIN: Ferritin: 118 ng/mL (ref 24–336)

## 2021-07-03 MED ORDER — TOLVAPTAN 15 MG PO TABS
15.0000 mg | ORAL_TABLET | Freq: Once | ORAL | Status: AC
  Administered 2021-07-03: 15 mg via ORAL
  Filled 2021-07-03: qty 1

## 2021-07-03 MED ORDER — FERROUS SULFATE 325 (65 FE) MG PO TABS
325.0000 mg | ORAL_TABLET | Freq: Three times a day (TID) | ORAL | Status: DC
  Administered 2021-07-03 – 2021-07-09 (×20): 325 mg via ORAL
  Filled 2021-07-03 (×21): qty 1

## 2021-07-03 MED ORDER — DOCUSATE SODIUM 100 MG PO CAPS
100.0000 mg | ORAL_CAPSULE | Freq: Two times a day (BID) | ORAL | Status: DC
  Administered 2021-07-03 – 2021-07-09 (×8): 100 mg via ORAL
  Filled 2021-07-03 (×11): qty 1

## 2021-07-03 MED ORDER — WARFARIN SODIUM 5 MG PO TABS
5.0000 mg | ORAL_TABLET | Freq: Once | ORAL | Status: AC
  Administered 2021-07-03: 5 mg via ORAL
  Filled 2021-07-03: qty 1

## 2021-07-03 NOTE — Progress Notes (Signed)
ANTICOAGULATION CONSULT NOTE  Pharmacy Consult for Heparin Indication:  mechanical mitral valve and atrial fibrillation  Allergies  Allergen Reactions   Flexeril [Cyclobenzaprine] Diarrhea    Patient Measurements: Height: 6' (182.9 cm) Weight: 68 kg (149 lb 14.6 oz) IBW/kg (Calculated) : 77.6  Heparin Dosing Weight: 68 kg  Vital Signs: Temp: 97.8 F (36.6 C) (01/06 0908) Temp Source: Oral (01/06 0908) BP: 109/65 (01/06 0908) Pulse Rate: 103 (01/06 0908)  Labs: Recent Labs    07/01/21 0345 07/01/21 1208 07/02/21 0140 07/02/21 1102 07/02/21 1957 07/03/21 0144  HGB 10.1*  --  9.2*  --   --  7.6*  HCT 30.3*  --  27.4*  --   --  23.0*  PLT 161  --  145*  --   --  133*  HEPARINUNFRC 0.35   < > 0.25* 0.23* 0.38 0.40  CREATININE 1.49*  --  1.26*  --   --  1.18   < > = values in this interval not displayed.    Estimated Creatinine Clearance: 37.6 mL/min (by C-G formula based on SCr of 1.18 mg/dL).   Assessment: 88 yo M admitted with GIB on 12/28. PTA warfarin (LD 12/28 @ 0730) for mechanical mitral valve and atrial fibrillation. INR 3.9 on admission > IV vitamin k 5mg  12/29 > INR 1.2 on recheck. Pharmacy consulted to start IV heparin while warfarin is on hold.  [facility] 12/14 regimen: warfarin 5mg  daily except 2.5mg  M/F, INR goal 2.5-3.5    Heparin infusion was held on 01/03 for procedure. Heparin infusion resumed at 2000 post-op. Heparin levels now therapeutic x2 at 0.38 and 0.40 respectively at 1400 units/hr. H/h dropped from 9.2 > 7.6, platelets from 145 > 133. Per discussion with RN, no s/sx bleeding or issues with infusion overnight.   Goal of Therapy:  Heparin level 0.3-0.5 units/ml Monitor platelets by anticoagulation protocol: Yes   Plan:   Resume warfarin 5 mg x1 @ 16:00 Continue heparin infusion at 1400 units/hr Check heparin level daily while on heparin Continue to monitor H&H and platelets    Thank you for allowing pharmacy to be a part of this  patients care.  Ardyth Harps, PharmD Clinical Pharmacist

## 2021-07-03 NOTE — Progress Notes (Signed)
Occupational Therapy Treatment Patient Details Name: Richard Spears MRN: [id number] DOB: August 12, 1925 Today's Date: 07/03/2021   History of present illness 88 y.o. male presentign to ED 12/28 with melana. Patient admitted with GI bleed and anemia. CT (+) lower GI bleed vs mass. CT also found large L inguinal hernia containing nonobstructed loop of large bowel. Colonoscopy 12/31 confirmed near obstructing mass in transverse colon. Biopsies obtained; path pending. S/p partial transverse colectomy 1/3. PMH includes: A-fib, BPH, OA, HTN, BLE venous ulcers, renal insufficiency, mechanical mitral valve replacement, Hx of fall 08/2020 s/p L femur ORIF and R/L hip arthroplasties.   OT comments  OT treatment session with focus on bed mobility, functional transfers, mobility household distances with use of RW, and activity tolerance. Patient continues to require Min to Mod A grossly for ADLs secondary to deficits listed below. OT will continue to follow acutely. Continued recommendation for SNF rehab.    Recommendations for follow up therapy are one component of a multi-disciplinary discharge planning process, led by the attending physician.  Recommendations may be updated based on patient status, additional functional criteria and insurance authorization.    Follow Up Recommendations  Skilled nursing-short term rehab (<3 hours/day)    Assistance Recommended at Discharge Frequent or constant Supervision/Assistance  Patient can return home with the following      Equipment Recommendations  None recommended by OT    Recommendations for Other Services      Precautions / Restrictions Precautions Precautions: Fall Precaution Comments: Hx of falls; monitor HR; abdominal incision w/ precautions; BLE unna boots Restrictions Weight Bearing Restrictions: No       Mobility Bed Mobility Overal bed mobility: Needs Assistance Bed Mobility: Rolling;Sidelying to Sit;Sit to Sidelying Rolling: Min  assist Sidelying to sit: Min assist;HOB elevated       General bed mobility comments: Min A for rolling to R and Min A to elevate trunk +rail. Increased time/effort.    Transfers Overall transfer level: Needs assistance Equipment used: Rolling walker (2 wheels) Transfers: Sit to/from Stand Sit to Stand: Min guard;Min assist           General transfer comment: Min guard to Min A for sit to stand from EOB to RW with increased time to rise. Cues for hand placement.     Balance Overall balance assessment: Needs assistance Sitting-balance support: No upper extremity supported;Feet supported Sitting balance-Leahy Scale: Fair     Standing balance support: Bilateral upper extremity supported;During functional activity;Reliant on assistive device for balance Standing balance-Leahy Scale: Fair Standing balance comment: Able to maintain static standing with unilateral UE support on RW. Reliant on BUE support with dynamic balance.                           ADL either performed or assessed with clinical judgement   ADL Overall ADL's : Needs assistance/impaired                             Toileting- Clothing Manipulation and Hygiene: Set up;Bed level Toileting - Clothing Manipulation Details (indicate cue type and reason): Urinal 2/2 urgency       General ADL Comments: Min A overall; Mod A with some LB ADLs.    Extremity/Trunk Assessment              Vision       Perception     Praxis      Cognition Arousal/Alertness:  Awake/alert Behavior During Therapy: WFL for tasks assessed/performed Overall Cognitive Status: Within Functional Limits for tasks assessed                                            Exercises     Shoulder Instructions       General Comments BP soft with transition for supine > EOB > standing. Did not assess orthostatic vitals.    Pertinent Vitals/ Pain       Pain Assessment: Faces Faces Pain Scale:  Hurts a little bit Pain Location: Abdomen Pain Descriptors / Indicators: Sore;Grimacing Pain Intervention(s): Limited activity within patient's tolerance;Monitored during session;Repositioned  Home Living                                          Prior Functioning/Environment              Frequency  Min 2X/week        Progress Toward Goals  OT Goals(current goals can now be found in the care plan section)  Progress towards OT goals: Progressing toward goals  Acute Rehab OT Goals Patient Stated Goal: Patient in agreement with d/c to SNF rehab. OT Goal Formulation: With patient Time For Goal Achievement: 07/15/21 Potential to Achieve Goals: Good ADL Goals Pt Will Perform Grooming: with modified independence;standing Pt Will Perform Upper Body Dressing: with modified independence;sitting Pt Will Perform Lower Body Dressing: with modified independence;sit to/from stand Pt Will Transfer to Toilet: with modified independence;ambulating Pt Will Perform Toileting - Clothing Manipulation and hygiene: with modified independence;sit to/from stand Pt/caregiver will Perform Home Exercise Program: Increased ROM;Increased strength;Both right and left upper extremity;Independently Additional ADL Goal #1: Patient will recall 3 strategies to reduce risk of falls in prep for safe d/c home.  Plan Discharge plan remains appropriate;Frequency remains appropriate    Co-evaluation                 AM-PAC OT "6 Clicks" Daily Activity     Outcome Measure   Help from another person eating meals?: A Little Help from another person taking care of personal grooming?: A Little Help from another person toileting, which includes using toliet, bedpan, or urinal?: A Lot Help from another person bathing (including washing, rinsing, drying)?: A Lot Help from another person to put on and taking off regular upper body clothing?: A Little Help from another person to put on and  taking off regular lower body clothing?: A Lot 6 Click Score: 15    End of Session Equipment Utilized During Treatment: Gait belt;Rolling walker (2 wheels)  OT Visit Diagnosis: Other abnormalities of gait and mobility (R26.89);Muscle weakness (generalized) (M62.81);Pain;History of falling (Z91.81)   Activity Tolerance Patient tolerated treatment well   Patient Left in chair;with call bell/phone within reach;with chair alarm set   Nurse Communication Mobility status        Time: 5027-7412 OT Time Calculation (min): 27 min  Charges: OT General Charges $OT Visit: 1 Visit OT Treatments $Self Care/Home Management : 8-22 mins $Therapeutic Activity: 8-22 mins  Lynanne Delgreco H. OTR/L Supplemental OT, Department of rehab services [phone]  Jimma Ortman R H. 07/03/2021, 1:34 PM

## 2021-07-03 NOTE — Progress Notes (Signed)
Patient ID: Richard Spears, male   DOB: 02/27/1926, 88 y.o.   MRN: [id number] [facility] Surgery Progress Note  3 Days Post-Op  Subjective: CC-  Sitting up in bed eating breakfast. Abdomen still hurts when he moves. No pain at rest. Tolerating soft diet and drank Boost x1 yesterday. Denies n/v. Passing a lot of flatus, no BM.  Objective: Vital signs in last 24 hours: Temp:  [97.8 F (36.6 C)-98.3 F (36.8 C)] 97.8 F (36.6 C) (01/06 0908) Pulse Rate:  [84-103] 103 (01/06 0908) Resp:  [13-19] 18 (01/06 0908) BP: (109-129)/(65-84) 109/65 (01/06 0908) SpO2:  [96 %-99 %] 96 % (01/06 0908) Last BM Date: 06/27/22 (per documentation)  Intake/Output from previous day: 01/05 0701 - 01/06 0700 In: 1690.7 [P.O.:240; I.V.:1190.2; IV Piggyback:260.5] Out: 1250 [Urine:1250] Intake/Output this shift: No intake/output data recorded.  PE: Gen: Awake, alert, NAD Abd: soft, nd, minimally tender. incision intact with staples present and no cellulitis or hematoma, some dried blood noted on honeycomb dressing  Lab Results:  Recent Labs    07/02/21 0140 07/03/21 0144  WBC 5.7 5.0  HGB 9.2* 7.6*  HCT 27.4* 23.0*  PLT 145* 133*   BMET Recent Labs    07/02/21 0140 07/03/21 0144  NA 130* 130*  K 4.5 4.5  CL 103 102  CO2 22 23  GLUCOSE 102* 87  BUN 24* 23  CREATININE 1.26* 1.18  CALCIUM 7.9* 7.8*   PT/INR No results for input(s): LABPROT, INR in the last 72 hours. CMP     Component Value Date/Time   NA 130 (L) 07/03/2021 0144   NA 134 (A) 10/23/2020 1035   NA 130 10/20/2017 0000   K 4.5 07/03/2021 0144   K 4.2 10/20/2017 0000   CL 102 07/03/2021 0144   CO2 23 07/03/2021 0144   GLUCOSE 87 07/03/2021 0144   BUN 23 07/03/2021 0144   BUN 33 (A) 10/23/2020 1035   CREATININE 1.18 07/03/2021 0144   CREATININE 1.38 10/20/2017 0000   CALCIUM 7.8 (L) 07/03/2021 0144   CALCIUM 8.6 10/20/2017 0000   PROT 4.8 (L) 07/03/2021 0144   ALBUMIN 2.0 (L) 07/03/2021 0144   AST 20  07/03/2021 0144   ALT 11 07/03/2021 0144   ALKPHOS 43 07/03/2021 0144   BILITOT 0.5 07/03/2021 0144   GFRNONAA 58 (L) 07/03/2021 0144   GFRAA 61 10/23/2020 1035   Lipase     Component Value Date/Time   LIPASE 34 06/24/2021 1220       Studies/Results: CT CHEST WO CONTRAST  Result Date: 07/02/2021 CLINICAL DATA:  Colon cancer, staging. EXAM: CT CHEST WITHOUT CONTRAST TECHNIQUE: Multidetector CT imaging of the chest was performed following the standard protocol without IV contrast. COMPARISON:  No prior chest CT. FINDINGS: Cardiovascular: Advanced atherosclerosis of the thoracic aorta. No aortic aneurysm. Descending aorta is tortuous. Multi chamber cardiomegaly. Mitral valve replacement. There are coronary artery calcifications. No pericardial effusion. Prominence of the central pulmonary artery at 3.4 cm. Mediastinum/Nodes: No enlarged mediastinal lymph nodes, paucity of body and mediastinal fat limits detailed assessment. There is no bulky hilar adenopathy on this unenhanced exam. No gross esophageal wall thickening. Upper esophagus slightly patulous. Lungs/Pleura: No pulmonary mass or suspicious nodule. Calcified granuloma in the anterior subpleural right middle lobe. Trace bilateral pleural effusions and associated atelectasis. Linear subsegmental atelectasis in the lingula. No confluent consolidation. Upper Abdomen: Free air in the upper abdomen likely related to recent partial colectomy. High-density material in the gallbladder was not seen on prior abdominal  CT and likely reflect bones are carious excretion of contrast. Musculoskeletal: Prior median sternotomy. L1 inferior endplate compression fracture. No blastic or destructive lytic lesion. Generalized paucity of body fat. IMPRESSION: 1. No evidence of metastatic disease in the thorax. 2. Trace bilateral pleural effusions and associated atelectasis. 3. Multi chamber cardiomegaly. Coronary artery calcifications. Prominence of the central  pulmonary artery suggesting pulmonary arterial hypertension. Aortic Atherosclerosis (ICD10-I70.0). Electronically Signed   By: Keith Rake M.D.   On: 07/02/2021 22:09    Anti-infectives: Anti-infectives (From admission, onward)    None        Assessment/Plan POD #3, s/p ex lap with segmental TC resection for adenocarcinoma, Dr. Kae Heller 1/3 - surgical path pending - Continue soft diet, add colace - continue PT/OT, mobilize - multi-modal pain control - oncology following - coumadin restarted today   FEN - soft diet VTE - heparin gtt>> coumadin ID - none currently Foley - out 1/5 and voiding   A fib MVR CKD   LOS: 9 days    Wellington Hampshire, [facility] Surgery 07/03/2021, 10:25 AM Please see Amion for pager number during day hours 7:00am-4:30pm

## 2021-07-03 NOTE — TOC Initial Note (Addendum)
Transition of Care [facility]) - Initial/Assessment Note    Patient Details  Name: Richard Spears MRN: [id number] Date of Birth: 10/09/1925  Transition of Care [facility]) CM/SW Contact:    Benard Halsted, LCSW Phone Number: 07/03/2021, 3:00 PM  Clinical Narrative:                 12pm-CSW spoke with patient about SNF recommendation. Per [facility], patient has a bed at [facility] if he needs SNF. Patient reported that he spent a lot of time last year on the SNF side and really does not want to go back there, especially as his wife will not be able to visit easily. He provided CSW with permission to contact his son Alferd Apa who lives in [place] to explore options. CSW spoke with Alferd Apa who reported that patient did better mentally once he was back in his home. He is checking to see if [facility] could provide enough care at home.   4pm-CSW received return call from [place] who has discussed options with patient and they are in agreement to go to [facility]. CSW spoke with Joellen Jersey there and she stated if patient discharges over the weekend, CSW to contact facility on call at [phone]. Patient will go to [facility], room 35. RN report # will be [phone]. No COVID test needed.     Barriers to Discharge: Continued Medical Work up   Patient Goals and CMS Choice Patient states their goals for this hospitalization and ongoing recovery are:: To return home w/his wife CMS Medicare.gov Compare Post Acute Care list provided to:: Patient Choice offered to / list presented to : Patient, Adult Children  Expected Discharge Plan and Services   In-house Referral: Clinical Social Work     Living arrangements for the past 2 months: [place]                                      Prior Living Arrangements/Services Living arrangements for the past 2 months: [place] Lives with:: Spouse Patient language and need for  interpreter reviewed:: Yes Do you feel safe going back to the place where you live?: Yes      Need for Family Participation in Patient Care: Yes (Comment) Care giver support system in place?: Yes (comment)   Criminal Activity/Legal Involvement Pertinent to Current Situation/Hospitalization: No - Comment as needed  Activities of Daily Living      Permission Sought/Granted Permission sought to share information with : Facility Sport and exercise psychologist, Family Supports    Share Information with NAME: Alferd Apa  Permission granted to share info w AGENCY: [facility]  Permission granted to share info w Relationship: Son  Permission granted to share info w Contact Information: [phone]  Emotional Assessment Appearance:: Appears stated age Attitude/Demeanor/Rapport: Engaged Affect (typically observed): Accepting, Appropriate Orientation: : Oriented to Self, Oriented to  Time, Oriented to Place, Oriented to Situation Alcohol / Substance Use: Not Applicable Psych Involvement: No (comment)  Admission diagnosis:  GI bleeding [K92.2] Lower GI bleed [K92.2] Anemia, unspecified type [D64.9] Patient Active Problem List   Diagnosis Date Noted   GI bleeding 06/24/2021   Acute blood loss anemia 02/03/2021   CKD (chronic kidney disease) stage 3, GFR 30-59 ml/min ([place]) 12/23/2020   Pressure ulcer of right buttock, stage 2 ([place]) 10/07/2020   UTI (urinary tract infection) 09/26/2020   Weight loss 09/23/2020  Insomnia 09/23/2020   Dysuria 09/23/2020   Hyponatremia 09/16/2020   BPH (benign prostatic hyperplasia) 09/03/2020   Generalized osteoarthritis of multiple sites 09/03/2020   Slow transit constipation 08/30/2020   Closed displaced fracture of distal epiphysis of left femur ([place]) 08/25/2020   Macrocytic anemia 08/25/2020   Fall 08/25/2020   Alcohol use 08/25/2020   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   Hip fracture ([place]) 10/09/2017   Essential hypertension 05/12/2017    Exertional dyspnea 01/18/2016   Venous insufficiency 02/14/2015   Cancer of skin of right lower leg 10/31/2014   AI (aortic insufficiency) 01/23/2014   Longstanding persistent atrial fibrillation: CHA2DS2-VASc Score 3 01/17/2014   Hyperlipidemia    S/P MVR (mitral valve replacement) 04/08/1996   PCP:  [name], MD Pharmacy:   [place], [address] [place] [address] Phone: [phone] Fax: [phone]  [place], [address] [place] [address] Phone: [phone] Fax: [phone]  [id number] [address] [place] [address] Phone: [phone] Fax: [phone]     Social Determinants of Health (SDOH) Interventions    Readmission Risk Interventions No flowsheet data found.

## 2021-07-03 NOTE — Progress Notes (Addendum)
PROGRESS NOTE                                                                                                                                                                                                             Patient Demographics:    Richard Spears, is a 88 y.o. male, DOB - 06/11/1926, TFT:[id number]  Outpatient Primary MD for the patient is [name], MD    LOS - 9  Admit date - 06/24/2021    Chief Complaint  Patient presents with   GI Problem       Brief Narrative (HPI from H&P)   - 89/M from independent living with spouse with history of paroxysmal atrial fibrillation and mechanical mitral valve replacement with St. Jude's metallic valve on Coumadin, chronic anemia, hypertension, chronic diastolic CHF, presented to the ED with 2 episodes of hematochezia, further work-up showed large colon mass underwent colon resection.  Currently on heparin drip for MVR.   Subjective:   Patient in bed, appears comfortable, denies any headache, no fever, no chest pain or pressure, no shortness of breath , no abdominal pain. No new focal weakness.    Assessment  & Plan :     Acute lower GI bleed with colonoscopy done on 06/27/2021 showing large transverse colon mass with acute blood loss related anemia - he received 1 unit of packed RBC transfusion this admission, Coumadin held currently on heparin drip.  Has underwent colonoscopy and has been seen by GI, general surgery on board s/p colon resection on 06/30/2021.  Operative notes reviewed with suspicion for multiple enlarged lymph nodes in intraoperatively.  Seen by oncology they will follow-up with him in the office.  2.  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  Present on admission.  S/p colon resection see #1 above, bowel activity has not returned fully, requested to advance activity, will monitor closely on liquid diet per surgery.  3.  CKD 3B.   Baseline creatinine around 1.5.  Monitor closely.  4.  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  5.  MVR.  Coumadin on hold currently on heparin drip.  Per surgery commence Coumadin on 07/03/2021.  6.  Paroxysmal A. fib [place] vas 2 score of 3.  See #5 above for anticoagulation.  Will place on low-dose Lopressor and monitor.  7.  Iron deficiency anemia  due to #1 above placed on oral iron along with stool softeners, some dilutional fall after 2.5 lits of IVF, follow H&H closely, type screen done, monitor closely.       Condition - Fair  Family Communication  : [name] [phone]  on 07/01/2021  Code Status :  DNR  Consults  : General surgery, GI, Oncology,  PUD Prophylaxis :     Procedures  :     Colonoscopy done on 06/27/2021 showing large transverse colon mass.  Colon resection by general surgery on 06/30/2021      Disposition Plan  :    Status is: Inpatient  Remains inpatient appropriate because: Heparin drip, MVR, colon cancer   DVT Prophylaxis  : Heparin drip  SCDs Start: 06/24/21 2115 warfarin (COUMADIN) tablet 5 mg     Lab Results  Component Value Date   PLT 133 (L) 07/03/2021    Diet :  Diet Order             DIET SOFT Room service appropriate? Yes; Fluid consistency: Thin  Diet effective now                    Inpatient Medications  Scheduled Meds:  acetaminophen  1,000 mg Oral Q6H   Or   acetaminophen  650 mg Rectal Q6H   Chlorhexidine Gluconate Cloth  6 each Topical Daily   docusate sodium  100 mg Oral BID   feeding supplement  1 Container Oral TID BM   ferrous sulfate  325 mg Oral TID WC   metoprolol tartrate  25 mg Oral BID   sodium chloride flush  3 mL Intravenous Q12H   tolvaptan  15 mg Oral Once   warfarin  5 mg Oral ONCE-1600   Warfarin - Pharmacist Dosing Inpatient   Does not apply q1600   Continuous Infusions:  heparin 1,400 Units/hr (07/03/21 0948)   PRN Meds:.fentaNYL (SUBLIMAZE) injection, LORazepam,  ondansetron (ZOFRAN) IV, oxyCODONE  Antibiotics  :    Anti-infectives (From admission, onward)    None        Time Spent in minutes  30   Lala Lund M.D on 07/03/2021 at 11:22 AM  To page go to [email or web address]   [facility] -  Office  [phone]  See all Orders from today for further details    Objective:   Vitals:   07/02/21 2011 07/03/21 0015 07/03/21 0328 07/03/21 0908  BP: 114/74 117/84 129/76 109/65  Pulse:  100 88 (!) 103  Resp: 15 18 19 18   Temp: 98 F (36.7 C) 97.8 F (36.6 C) 97.9 F (36.6 C) 97.8 F (36.6 C)  TempSrc: Oral Oral Oral Oral  SpO2: 98% 96% 97% 96%  Weight:      Height:        Wt Readings from Last 3 Encounters:  06/27/21 68 kg  04/28/21 68.1 kg  03/06/21 72 kg     Intake/Output Summary (Last 24 hours) at 07/03/2021 1122 Last data filed at 07/03/2021 0656 Gross per 24 hour  Intake 1690.66 ml  Output 1250 ml  Net 440.66 ml     Physical Exam  Awake Alert, No new F.N deficits, Normal affect [place].AT,PERRAL Supple Neck, No JVD,   Symmetrical Chest wall movement, Good air movement bilaterally, CTAB RRR,No Gallops, Rubs or new Murmurs,  +ve bowel sounds, midline infraumbilical abdominal scar stable, large soft chronic left inguinal hernia.   No Cyanosis, Clubbing or edema         Data  Review:    CBC Recent Labs  Lab 06/29/21 0255 06/30/21 0256 06/30/21 1435 07/01/21 0345 07/02/21 0140 07/03/21 0144  WBC 3.1* 3.4*  --  8.4 5.7 5.0  HGB 8.8* 9.7* 11.1* 10.1* 9.2* 7.6*  HCT 27.4* 28.8* 34.2* 30.3* 27.4* 23.0*  PLT 160 160  --  161 145* 133*  MCV 94.8 93.8  --  94.4 94.8 95.4  MCH 30.4 31.6  --  31.5 31.8 31.5  MCHC 32.1 33.7  --  33.3 33.6 33.0  RDW 18.2* 17.8*  --  18.0* 18.1* 18.3*  LYMPHSABS  --   --   --   --  0.7 0.8  MONOABS  --   --   --   --  0.4 0.3  EOSABS  --   --   --   --  0.1 0.2  BASOSABS  --   --   --   --  0.0 0.0    Electrolytes Recent Labs  Lab 06/27/21 0044 06/28/21 0036  06/29/21 0255 06/30/21 0256 07/01/21 0345 07/02/21 0140 07/03/21 0144  NA 133*   < > 135 134* 131* 130* 130*  K 4.6   < > 4.1 4.3 4.8 4.5 4.5  CL 103   < > 105 105 105 103 102  CO2 23   < > 21* 21* 19* 22 23  GLUCOSE 99   < > 88 84 174* 102* 87  BUN 15   < > 10 10 20  24* 23  CREATININE 1.39*   < > 1.32* 1.41* 1.49* 1.26* 1.18  CALCIUM 8.3*   < > 8.0* 8.3* 8.2* 7.9* 7.8*  AST  --   --   --   --  24 22 20   ALT  --   --   --   --  12 8 11   ALKPHOS  --   --   --   --  48 45 43  BILITOT  --   --   --   --  0.6 0.8 0.5  ALBUMIN  --   --   --   --  2.4* 2.2* 2.0*  MG  --   --  1.9  --  2.0 1.9 1.8  INR 1.2  --   --   --   --   --   --   BNP  --   --   --   --   --  270.0* 582.9*   < > = values in this interval not displayed.    ------------------------------------------------------------------------------------------------------------------ No results for input(s): CHOL, HDL, LDLCALC, TRIG, CHOLHDL, LDLDIRECT in the last 72 hours.  No results found for: HGBA1C  No results for input(s): TSH, T4TOTAL, T3FREE, THYROIDAB in the last 72 hours.  Invalid input(s): FREET3 ------------------------------------------------------------------------------------------------------------------ ID Labs Recent Labs  Lab 06/29/21 0255 06/30/21 0256 07/01/21 0345 07/02/21 0140 07/03/21 0144  WBC 3.1* 3.4* 8.4 5.7 5.0  PLT 160 160 161 145* 133*  CREATININE 1.32* 1.41* 1.49* 1.26* 1.18   Cardiac Enzymes No results for input(s): CKMB, TROPONINI, MYOGLOBIN in the last 168 hours.  Invalid input(s): CK   Radiology Reports CT CHEST WO CONTRAST  Result Date: 07/02/2021 CLINICAL DATA:  Colon cancer, staging. EXAM: CT CHEST WITHOUT CONTRAST TECHNIQUE: Multidetector CT imaging of the chest was performed following the standard protocol without IV contrast. COMPARISON:  No prior chest CT. FINDINGS: Cardiovascular: Advanced atherosclerosis of the thoracic aorta. No aortic aneurysm. Descending aorta is  tortuous. Multi chamber cardiomegaly. Mitral valve replacement. There  are coronary artery calcifications. No pericardial effusion. Prominence of the central pulmonary artery at 3.4 cm. Mediastinum/Nodes: No enlarged mediastinal lymph nodes, paucity of body and mediastinal fat limits detailed assessment. There is no bulky hilar adenopathy on this unenhanced exam. No gross esophageal wall thickening. Upper esophagus slightly patulous. Lungs/Pleura: No pulmonary mass or suspicious nodule. Calcified granuloma in the anterior subpleural right middle lobe. Trace bilateral pleural effusions and associated atelectasis. Linear subsegmental atelectasis in the lingula. No confluent consolidation. Upper Abdomen: Free air in the upper abdomen likely related to recent partial colectomy. High-density material in the gallbladder was not seen on prior abdominal CT and likely reflect bones are carious excretion of contrast. Musculoskeletal: Prior median sternotomy. L1 inferior endplate compression fracture. No blastic or destructive lytic lesion. Generalized paucity of body fat. IMPRESSION: 1. No evidence of metastatic disease in the thorax. 2. Trace bilateral pleural effusions and associated atelectasis. 3. Multi chamber cardiomegaly. Coronary artery calcifications. Prominence of the central pulmonary artery suggesting pulmonary arterial hypertension. Aortic Atherosclerosis (ICD10-I70.0). Electronically Signed   By: Keith Rake M.D.   On: 07/02/2021 22:09

## 2021-07-03 NOTE — NC FL2 (Signed)
[place] MEDICAID FL2 LEVEL OF CARE SCREENING TOOL     IDENTIFICATION  Patient Name: Richard Spears Birthdate: 06/13/1926 Sex: male Admission Date (Current Location): 06/24/2021  [facility] and [place] Number:  Herbalist and Address:  [facility], [place] [address] [place], [place] [address]      Provider Number: [id number]  Attending Physician Name and Address:  Thurnell Lose, MD  Relative Name and Phone Number:       Current Level of Care: Hospital Recommended Level of Care: [place] Prior Approval Number:    Date Approved/Denied:   PASRR Number: [id number]  Discharge Plan: SNF    Current Diagnoses: Patient Active Problem List   Diagnosis Date Noted   GI bleeding 06/24/2021   Acute blood loss anemia 02/03/2021   CKD (chronic kidney disease) stage 3, GFR 30-59 ml/min ([place]) 12/23/2020   Pressure ulcer of right buttock, stage 2 ([place]) 10/07/2020   UTI (urinary tract infection) 09/26/2020   Weight loss 09/23/2020   Insomnia 09/23/2020   Dysuria 09/23/2020   Hyponatremia 09/16/2020   BPH (benign prostatic hyperplasia) 09/03/2020   Generalized osteoarthritis of multiple sites 09/03/2020   Slow transit constipation 08/30/2020   Closed displaced fracture of distal epiphysis of left femur ([place]) 08/25/2020   Macrocytic anemia 08/25/2020   Fall 08/25/2020   Alcohol use 08/25/2020   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   Hip fracture ([place]) 10/09/2017   Essential hypertension 05/12/2017   Exertional dyspnea 01/18/2016   Venous insufficiency 02/14/2015   Cancer of skin of right lower leg 10/31/2014   AI (aortic insufficiency) 01/23/2014   Longstanding persistent atrial fibrillation: CHA2DS2-VASc Score 3 01/17/2014   Hyperlipidemia    S/P MVR (mitral valve replacement) 04/08/1996    Orientation RESPIRATION BLADDER Height & Weight     Self, Time, Situation, Place  Normal Continent Weight: 149 lb 14.6 oz (68  kg) Height:  6' (182.9 cm)  BEHAVIORAL SYMPTOMS/MOOD NEUROLOGICAL BOWEL NUTRITION STATUS      Incontinent Diet (Please See DC Summary)  AMBULATORY STATUS COMMUNICATION OF NEEDS Skin   Extensive Assist   Surgical wounds (closed incision on abdomen; venous stasis ulcer on leg)                       Personal Care Assistance Level of Assistance  Bathing, Feeding, Dressing Bathing Assistance: Maximum assistance Feeding assistance: Independent Dressing Assistance: Maximum assistance     Functional Limitations Info  Sight, Hearing, Speech Sight Info: Impaired Hearing Info: Adequate Speech Info: Adequate    SPECIAL CARE FACTORS FREQUENCY  OT (By licensed OT), PT (By licensed PT)     PT Frequency: 5x/week OT Frequency: 5x/week            Contractures Contractures Info: Not present    Additional Factors Info  Code Status, Allergies Code Status Info: DNR Allergies Info: Flexeril (Cyclobenzaprine)           Current Medications (07/03/2021):  This is the current hospital active medication list Current Facility-Administered Medications  Medication Dose Route Frequency Provider Last Rate Last Admin   acetaminophen (TYLENOL) tablet 1,000 mg  1,000 mg Oral Q6H Romana Juniper A, MD   1,000 mg at 07/03/21 1704   Or   acetaminophen (TYLENOL) suppository 650 mg  650 mg Rectal Q6H Clovis Riley, MD       Chlorhexidine Gluconate Cloth 2 % PADS 6 each  6 each Topical Daily [name], MD  6 each at 07/03/21 0942   docusate sodium (COLACE) capsule 100 mg  100 mg Oral BID Margie Billet A, PA-C   100 mg at 07/03/21 1003   feeding supplement (BOOST / RESOURCE BREEZE) liquid 1 Container  1 Container Oral TID BM Clovis Riley, MD   1 Container at 07/03/21 1436   fentaNYL (SUBLIMAZE) injection 12.5-25 mcg  12.5-25 mcg Intravenous Q4H PRN Romana Juniper A, MD       ferrous sulfate tablet 325 mg  325 mg Oral TID WC Thurnell Lose, MD   325 mg at 07/03/21 1704   heparin ADULT  infusion 100 units/mL (25000 units/269mL)  1,400 Units/hr Intravenous Continuous Mignon Pine, RPH 14 mL/hr at 07/03/21 0948 1,400 Units/hr at 07/03/21 0948   LORazepam (ATIVAN) tablet 0.5 mg  0.5 mg Oral Daily PRN Romana Juniper A, MD   0.5 mg at 07/01/21 2216   metoprolol tartrate (LOPRESSOR) tablet 25 mg  25 mg Oral BID Thurnell Lose, MD   25 mg at 07/03/21 0941   ondansetron (ZOFRAN) injection 4 mg  4 mg Intravenous Q6H PRN Romana Juniper A, MD       oxyCODONE (Oxy IR/ROXICODONE) immediate release tablet 2.5-5 mg  2.5-5 mg Oral Q6H PRN Romana Juniper A, MD       sodium chloride flush (NS) 0.9 % injection 3 mL  3 mL Intravenous Q12H Romana Juniper A, MD   3 mL at 07/03/21 1003   Warfarin - Pharmacist Dosing Inpatient   Does not apply q1600 Mignon Pine [facility]   Given at 07/03/21 1705     Discharge Medications: Please see discharge summary for a list of discharge medications.  Relevant Imaging Results:  Relevant Lab Results:   Additional Information SSN# [id number] Moderna COVID-19 Vaccine 05/12/2020, 07/30/2019, 07/02/2019  Moderna Covid-19 Booster Vaccine 12/03/2020  Benard Halsted, LCSW

## 2021-07-03 NOTE — Progress Notes (Signed)
Brief oncology note:  CT chest without evidence of metastatic disease.  Surgical pathology report is still pending.  We will plan for outpatient surveillance.  Given his multiple comorbidities, he is not a candidate for adjuvant chemotherapy.  I have sent a message to our scheduling department to arrange for follow-up in approximately 6 weeks.  Mikey Bussing, DNP, AGPCNP-BC, AOCNP

## 2021-07-03 NOTE — Progress Notes (Signed)
Physical Therapy Treatment Patient Details Name: Richard Spears MRN: [id number] DOB: [birth year] Today's Date: 07/03/2021   History of Present Illness 88 y.o. male presentign to ED 12/28 with melana. Patient admitted with GI bleed and anemia. CT (+) lower GI bleed vs mass. CT also found large L inguinal hernia containing nonobstructed loop of large bowel. Colonoscopy 12/31 confirmed near obstructing mass in transverse colon. Biopsies obtained; path pending. S/p partial transverse colectomy 1/3. PMH includes: A-fib, BPH, OA, HTN, BLE venous ulcers, renal insufficiency, mechanical mitral valve replacement, Hx of fall 08/2020 s/p L femur ORIF and R/L hip arthroplasties.    PT Comments    Pt struggled more today from LBP from sitting too long in the recliner.  Pt needed incrementally more assist, still worked on sit to stands, progression of gait stability and gait quality and better sequencing from sit to sidelying for back to bed.    Recommendations for follow up therapy are one component of a multi-disciplinary discharge planning process, led by the attending physician.  Recommendations may be updated based on patient status, additional functional criteria and insurance authorization.  Follow Up Recommendations  Skilled nursing-short term rehab (<3 hours/day)     Assistance Recommended at Discharge Intermittent Supervision/Assistance  Patient can return home with the following A little help with walking and/or transfers;A lot of help with bathing/dressing/bathroom   Equipment Recommendations  None recommended by PT    Recommendations for Other Services       Precautions / Restrictions Precautions Precautions: Fall Precaution Comments: Hx of falls; monitor HR; abdominal incision w/ precautions; BLE unna boots     Mobility  Bed Mobility   Bed Mobility: Sit to Sidelying         Sit to sidelying: Min assist General bed mobility comments: cuing for better bed mobility technique  to manage pain in midline abdoment    Transfers Overall transfer level: Needs assistance Equipment used: Rolling walker (2 wheels) Transfers: Sit to/from Stand Sit to Stand: Min assist                Ambulation/Gait Ambulation/Gait assistance: Min Web designer (Feet): 35 Feet Assistive device: Rolling walker (2 wheels) Gait Pattern/deviations: Step-through pattern Gait velocity: slower Gait velocity interpretation: <1.8 ft/sec, indicate of risk for recurrent falls   General Gait Details: again, short mildly unsteady steps, cues for posture.  Pt deferred out in the halls due to back pain from staying in the recliner too long.   Stairs             Wheelchair Mobility    Modified Rankin (Stroke Patients Only)       Balance Overall balance assessment: Needs assistance   Sitting balance-Leahy Scale: Fair       Standing balance-Leahy Scale: Fair                              Cognition Arousal/Alertness: Awake/alert Behavior During Therapy: WFL for tasks assessed/performed Overall Cognitive Status: Within Functional Limits for tasks assessed                                 General Comments: A&Ox4; follows 1-2 step verbal commands without difficulty; requires increased time to process verbal information, mild expressive difficulty.        Exercises      General Comments        Pertinent Vitals/Pain  Pain Assessment: Faces Faces Pain Scale: Hurts little more Pain Location: back Pain Descriptors / Indicators: Discomfort;Grimacing Pain Intervention(s): Monitored during session    Home Living                          Prior Function            PT Goals (current goals can now be found in the care plan section) Acute Rehab PT Goals PT Goal Formulation: With patient Time For Goal Achievement: 07/15/21 Potential to Achieve Goals: Good Progress towards PT goals: Progressing toward goals     Frequency    Min 2X/week      PT Plan Current plan remains appropriate    Co-evaluation              AM-PAC PT "6 Clicks" Mobility   Outcome Measure  Help needed turning from your back to your side while in a flat bed without using bedrails?: A Little Help needed moving from lying on your back to sitting on the side of a flat bed without using bedrails?: A Little Help needed moving to and from a bed to a chair (including a wheelchair)?: A Little Help needed standing up from a chair using your arms (e.g., wheelchair or bedside chair)?: A Little Help needed to walk in hospital room?: A Little Help needed climbing 3-5 steps with a railing? : A Little 6 Click Score: 18    End of Session   Activity Tolerance: Patient tolerated treatment well;Patient limited by pain Patient left: in bed;with call bell/phone within reach;with bed alarm set Nurse Communication: Mobility status PT Visit Diagnosis: Other abnormalities of gait and mobility (R26.89);Muscle weakness (generalized) (M62.81);Difficulty in walking, not elsewhere classified (R26.2)     Time: 6195-0932 PT Time Calculation (min) (ACUTE ONLY): 23 min  Charges:  $Gait Training: 8-22 mins $Therapeutic Activity: 8-22 mins                     07/03/2021  Ginger Carne., PT Acute Rehabilitation Services [phone]  (pager) [phone]  (office)   Tessie Fass Isis Costanza 07/03/2021, 6:24 PM

## 2021-07-03 NOTE — Telephone Encounter (Signed)
Scheduled per 01/05 scheduled message, patient has been called and notified. °

## 2021-07-03 NOTE — Progress Notes (Signed)
Pt has had first bowel movement since his surgery. Bowel movement noted to be bright, red blood with loose stool. MD made aware.

## 2021-07-03 NOTE — Telephone Encounter (Signed)
Pt's son called wanting to know the next course/plan of action regarding treatment.  Informed Dr. Burr Medico of the pt's son's telephone call and asked if Dr. Burr Medico could give him a returned call.

## 2021-07-03 NOTE — Discharge Instructions (Addendum)
Information on my medicine - Coumadin   (Warfarin)  This medication education was reviewed with me or my healthcare representative as part of my discharge preparation.   Why was Coumadin prescribed for you? Coumadin was prescribed for you because you have a blood clot or a medical condition that can cause an increased risk of forming blood clots. Blood clots can cause serious health problems by blocking the flow of blood to the heart, lung, or brain. Coumadin can prevent harmful blood clots from forming. As a reminder your indication for Coumadin is:  Blood Clot Prevention after Heart Valve Surgery  What test will check on my response to Coumadin? While on Coumadin (warfarin) you will need to have an INR test regularly to ensure that your dose is keeping you in the desired range. The INR (international normalized ratio) number is calculated from the result of the laboratory test called prothrombin time (PT).  If an INR APPOINTMENT HAS NOT ALREADY BEEN MADE FOR YOU please schedule an appointment to have this lab work done by your health care provider within 7 days. Your INR goal is usually a number between:  2 to 3 or your provider may give you a more narrow range like 2-2.5.  Ask your health care provider during an office visit what your goal INR is.  What  do you need to  know  About  COUMADIN? Take Coumadin (warfarin) exactly as prescribed by your healthcare provider about the same time each day.  DO NOT stop taking without talking to the doctor who prescribed the medication.  Stopping without other blood clot prevention medication to take the place of Coumadin may increase your risk of developing a new clot or stroke.  Get refills before you run out.  What do you do if you miss a dose? If you miss a dose, take it as soon as you remember on the same day then continue your regularly scheduled regimen the next day.  Do not take two doses of Coumadin at the same time.  Important Safety  Information A possible side effect of Coumadin (Warfarin) is an increased risk of bleeding. You should call your healthcare provider right away if you experience any of the following: Bleeding from an injury or your nose that does not stop. Unusual colored urine (red or dark brown) or unusual colored stools (red or black). Unusual bruising for unknown reasons. A serious fall or if you hit your head (even if there is no bleeding).  Some foods or medicines interact with Coumadin (warfarin) and might alter your response to warfarin. To help avoid this: Eat a balanced diet, maintaining a consistent amount of Vitamin K. Notify your provider about major diet changes you plan to make. Avoid alcohol or limit your intake to 1 drink for women and 2 drinks for men per day. (1 drink is 5 oz. wine, 12 oz. beer, or 1.5 oz. liquor.)  Make sure that ANY health care provider who prescribes medication for you knows that you are taking Coumadin (warfarin).  Also make sure the healthcare provider who is monitoring your Coumadin knows when you have started a new medication including herbals and non-prescription products.  Coumadin (Warfarin)  Major Drug Interactions  Increased Warfarin Effect Decreased Warfarin Effect  Alcohol (large quantities) Antibiotics (esp. Septra/Bactrim, Flagyl, Cipro) Amiodarone (Cordarone) Aspirin (ASA) Cimetidine (Tagamet) Megestrol (Megace) NSAIDs (ibuprofen, naproxen, etc.) Piroxicam (Feldene) Propafenone (Rythmol SR) Propranolol (Inderal) Isoniazid (INH) Posaconazole (Noxafil) Barbiturates (Phenobarbital) Carbamazepine (Tegretol) Chlordiazepoxide (Librium) Cholestyramine (Questran) Griseofulvin Oral  Contraceptives Rifampin Sucralfate (Carafate) Vitamin K   Coumadin (Warfarin) Major Herbal Interactions  Increased Warfarin Effect Decreased Warfarin Effect  Garlic Ginseng Ginkgo biloba Coenzyme Q10 Green tea St. Johns wort    Coumadin (Warfarin) FOOD  Interactions  Eat a consistent number of servings per week of foods HIGH in Vitamin K (1 serving =  cup)  Collards (cooked, or boiled & drained) Kale (cooked, or boiled & drained) Mustard greens (cooked, or boiled & drained) Parsley *serving size only =  cup Spinach (cooked, or boiled & drained) Swiss chard (cooked, or boiled & drained) Turnip greens (cooked, or boiled & drained)  Eat a consistent number of servings per week of foods MEDIUM-HIGH in Vitamin K (1 serving = 1 cup)  Asparagus (cooked, or boiled & drained) Broccoli (cooked, boiled & drained, or raw & chopped) Brussel sprouts (cooked, or boiled & drained) *serving size only =  cup Lettuce, raw (green leaf, endive, romaine) Spinach, raw Turnip greens, raw & chopped   These websites have more information on Coumadin (warfarin):  FailFactory.se; VeganReport.com.au;   CCS      [facility], [place] [phone]  OPEN ABDOMINAL SURGERY: POST OP INSTRUCTIONS  Always review your discharge instruction sheet given to you by the facility where your surgery was performed.  IF YOU HAVE DISABILITY OR FAMILY LEAVE FORMS, YOU MUST BRING THEM TO THE OFFICE FOR PROCESSING.  PLEASE DO NOT GIVE THEM TO YOUR DOCTOR.  A prescription for pain medication may be given to you upon discharge.  Take your pain medication as prescribed, if needed.  If narcotic pain medicine is not needed, then you may take acetaminophen (Tylenol) or ibuprofen (Advil) as needed. Take your usually prescribed medications unless otherwise directed. If you need a refill on your pain medication, please contact your pharmacy. They will contact our office to request authorization.  Prescriptions will not be filled after 5pm or on week-ends. You should follow a light diet the first few days after arrival home, such as soup and crackers, pudding, etc.unless your doctor has advised otherwise. A high-fiber, low fat diet can be resumed as tolerated.    Be sure to include lots of fluids daily. Most patients will experience some swelling and bruising on the chest and neck area.  Ice packs will help.  Swelling and bruising can take several days to resolve Most patients will experience some swelling and bruising in the area of the incision. Ice pack will help. Swelling and bruising can take several days to resolve..  It is common to experience some constipation if taking pain medication after surgery.  Increasing fluid intake and taking a stool softener will usually help or prevent this problem from occurring.  A mild laxative (Milk of Magnesia or Miralax) should be taken according to package directions if there are no bowel movements after 48 hours.  You may have steri-strips (small skin tapes) in place directly over the incision.  These strips should be left on the skin for 7-10 days.  If your surgeon used skin glue on the incision, you may shower in 24 hours.  The glue will flake off over the next 2-3 weeks.  Any sutures or staples will be removed at the office during your follow-up visit. You may find that a light gauze bandage over your incision may keep your staples from being rubbed or pulled. You may shower and replace the bandage daily. ACTIVITIES:  You may resume regular (light) daily activities beginning the next day--such as daily self-care, walking,  climbing stairs--gradually increasing activities as tolerated.  You may have sexual intercourse when it is comfortable.  Refrain from any heavy lifting or straining until approved by your doctor. You may drive when you no longer are taking prescription pain medication, you can comfortably wear a seatbelt, and you can safely maneuver your car and apply brakes You should see your doctor in the office for a follow-up appointment approximately two weeks after your surgery.  Make sure that you call for this appointment within a day or two after you arrive home to insure a convenient appointment  time.   WHEN TO CALL YOUR DOCTOR: Fever over 101.0 Inability to urinate Nausea and/or vomiting Extreme swelling or bruising Continued bleeding from incision. Increased pain, redness, or drainage from the incision. Difficulty swallowing or breathing Muscle cramping or spasms. Numbness or tingling in hands or feet or around lips.  The clinic staff is available to answer your questions during regular business hours.  Please dont hesitate to call and ask to speak to one of the nurses if you have concerns.  For further questions, please visit [email or web address]

## 2021-07-04 DIAGNOSIS — D62 Acute posthemorrhagic anemia: Secondary | ICD-10-CM | POA: Diagnosis not present

## 2021-07-04 LAB — CBC WITH DIFFERENTIAL/PLATELET
Abs Immature Granulocytes: 0.16 10*3/uL — ABNORMAL HIGH (ref 0.00–0.07)
Abs Immature Granulocytes: 0.15 10*3/uL — ABNORMAL HIGH (ref 0.00–0.07)
Basophils Absolute: 0 10*3/uL (ref 0.0–0.1)
Basophils Absolute: 0 10*3/uL (ref 0.0–0.1)
Basophils Relative: 0 %
Basophils Relative: 1 %
Eosinophils Absolute: 0.2 10*3/uL (ref 0.0–0.5)
Eosinophils Absolute: 0.2 10*3/uL (ref 0.0–0.5)
Eosinophils Relative: 5 %
Eosinophils Relative: 6 %
HCT: 20.1 % — ABNORMAL LOW (ref 39.0–52.0)
HCT: 24.7 % — ABNORMAL LOW (ref 39.0–52.0)
Hemoglobin: 6.7 g/dL — CL (ref 13.0–17.0)
Hemoglobin: 8.2 g/dL — ABNORMAL LOW (ref 13.0–17.0)
Immature Granulocytes: 4 %
Immature Granulocytes: 4 %
Lymphocytes Relative: 17 %
Lymphocytes Relative: 17 %
Lymphs Abs: 0.7 10*3/uL (ref 0.7–4.0)
Lymphs Abs: 0.6 10*3/uL — ABNORMAL LOW (ref 0.7–4.0)
MCH: 32.1 pg (ref 26.0–34.0)
MCH: 30.4 pg (ref 26.0–34.0)
MCHC: 33.3 g/dL (ref 30.0–36.0)
MCHC: 33.2 g/dL (ref 30.0–36.0)
MCV: 96.2 fL (ref 80.0–100.0)
MCV: 91.5 fL (ref 80.0–100.0)
Monocytes Absolute: 0.3 10*3/uL (ref 0.1–1.0)
Monocytes Absolute: 0.3 10*3/uL (ref 0.1–1.0)
Monocytes Relative: 8 %
Monocytes Relative: 8 %
Neutro Abs: 2.7 10*3/uL (ref 1.7–7.7)
Neutro Abs: 2.5 10*3/uL (ref 1.7–7.7)
Neutrophils Relative %: 66 %
Neutrophils Relative %: 64 %
Platelets: 145 10*3/uL — ABNORMAL LOW (ref 150–400)
Platelets: 123 10*3/uL — ABNORMAL LOW (ref 150–400)
RBC: 2.09 MIL/uL — ABNORMAL LOW (ref 4.22–5.81)
RBC: 2.7 MIL/uL — ABNORMAL LOW (ref 4.22–5.81)
RDW: 18.3 % — ABNORMAL HIGH (ref 11.5–15.5)
RDW: 20.2 % — ABNORMAL HIGH (ref 11.5–15.5)
WBC: 4.1 10*3/uL (ref 4.0–10.5)
WBC: 3.8 10*3/uL — ABNORMAL LOW (ref 4.0–10.5)
nRBC: 0 % (ref 0.0–0.2)
nRBC: 1 % — ABNORMAL HIGH (ref 0.0–0.2)

## 2021-07-04 LAB — COMPREHENSIVE METABOLIC PANEL
ALT: 11 U/L (ref 0–44)
AST: 17 U/L (ref 15–41)
Albumin: 1.9 g/dL — ABNORMAL LOW (ref 3.5–5.0)
Alkaline Phosphatase: 43 U/L (ref 38–126)
Anion gap: 3 — ABNORMAL LOW (ref 5–15)
BUN: 27 mg/dL — ABNORMAL HIGH (ref 8–23)
CO2: 23 mmol/L (ref 22–32)
Calcium: 8.1 mg/dL — ABNORMAL LOW (ref 8.9–10.3)
Chloride: 108 mmol/L (ref 98–111)
Creatinine, Ser: 1.2 mg/dL (ref 0.61–1.24)
GFR, Estimated: 56 mL/min — ABNORMAL LOW (ref >60)
Glucose, Bld: 133 mg/dL — ABNORMAL HIGH (ref 70–99)
Potassium: 4.1 mmol/L (ref 3.5–5.1)
Sodium: 134 mmol/L — ABNORMAL LOW (ref 135–145)
Total Bilirubin: 0.4 mg/dL (ref 0.3–1.2)
Total Protein: 4.7 g/dL — ABNORMAL LOW (ref 6.5–8.1)

## 2021-07-04 LAB — PROTIME-INR
INR: 1.3 — ABNORMAL HIGH (ref 0.8–1.2)
Prothrombin Time: 16 seconds — ABNORMAL HIGH (ref 11.4–15.2)

## 2021-07-04 LAB — BRAIN NATRIURETIC PEPTIDE: B Natriuretic Peptide: 526.5 pg/mL — ABNORMAL HIGH (ref 0.0–100.0)

## 2021-07-04 LAB — PREPARE RBC (CROSSMATCH)

## 2021-07-04 LAB — SURGICAL PATHOLOGY

## 2021-07-04 LAB — TRAUMA TEG PANEL
CFF Max Amplitude: 29.6 mm (ref 15–32)
Citrated Kaolin (R): 4.4 min — ABNORMAL LOW (ref 4.6–9.1)
Citrated Rapid TEG (MA): 65.2 mm (ref 52–70)
Lysis at 30 Minutes: 0.1 % (ref 0.0–2.6)

## 2021-07-04 LAB — HEPARIN LEVEL (UNFRACTIONATED): Heparin Unfractionated: 0.52 IU/mL (ref 0.30–0.70)

## 2021-07-04 LAB — PHOSPHORUS: Phosphorus: 2.1 mg/dL — ABNORMAL LOW (ref 2.5–4.6)

## 2021-07-04 LAB — MAGNESIUM: Magnesium: 2 mg/dL (ref 1.7–2.4)

## 2021-07-04 MED ORDER — PANTOPRAZOLE SODIUM 40 MG IV SOLR
40.0000 mg | Freq: Two times a day (BID) | INTRAVENOUS | Status: DC
  Administered 2021-07-07 – 2021-07-17 (×20): 40 mg via INTRAVENOUS
  Filled 2021-07-04 (×20): qty 40

## 2021-07-04 MED ORDER — PANTOPRAZOLE 80MG IVPB - SIMPLE MED
80.0000 mg | Freq: Once | INTRAVENOUS | Status: AC
  Administered 2021-07-04: 80 mg via INTRAVENOUS
  Filled 2021-07-04: qty 80

## 2021-07-04 MED ORDER — PANTOPRAZOLE SODIUM 40 MG IV SOLR
40.0000 mg | Freq: Two times a day (BID) | INTRAVENOUS | Status: DC
Start: 2021-07-04 — End: 2021-07-04

## 2021-07-04 MED ORDER — SODIUM PHOSPHATES 45 MMOLE/15ML IV SOLN
30.0000 mmol | Freq: Once | INTRAVENOUS | Status: AC
  Administered 2021-07-04: 30 mmol via INTRAVENOUS
  Filled 2021-07-04: qty 10

## 2021-07-04 MED ORDER — SODIUM CHLORIDE 0.9% IV SOLUTION: Freq: Once | INTRAVENOUS | Status: AC

## 2021-07-04 MED ORDER — PANTOPRAZOLE INFUSION (NEW) - SIMPLE MED
8.0000 mg/h | INTRAVENOUS | Status: AC
  Administered 2021-07-04 – 2021-07-07 (×7): 8 mg/h via INTRAVENOUS
  Filled 2021-07-04: qty 80
  Filled 2021-07-04 (×2): qty 100
  Filled 2021-07-04 (×4): qty 80
  Filled 2021-07-04: qty 100
  Filled 2021-07-04: qty 80
  Filled 2021-07-04: qty 100

## 2021-07-04 NOTE — Progress Notes (Signed)
First blood transfusion completed with no reaction.. Starting the 2nd now.

## 2021-07-04 NOTE — Progress Notes (Signed)
PROGRESS NOTE                                                                                                                                                                                                             Patient Demographics:    Richard Spears, is a 88 y.o. male, DOB - [birth year], OLM:[id number]  Outpatient Primary MD for the patient is Richard Orn, MD    LOS - 10  Admit date - 06/24/2021    Chief Complaint  Patient presents with   GI Problem       Brief Narrative (HPI from H&P)   - 89/M from independent living with spouse with history of paroxysmal atrial fibrillation and mechanical mitral valve replacement with St. Jude's metallic valve on Coumadin, chronic anemia, hypertension, chronic diastolic CHF, presented to the ED with 2 episodes of hematochezia, further work-up showed large colon mass underwent colon resection.  Currently on heparin drip for MVR.   Subjective:   Patient in bed, appears comfortable, denies any headache, no fever, no chest pain or pressure, no shortness of breath , no abdominal pain. No new focal weakness.  Has had 2-3 bloody bowel movements in the last 12 hours.   Assessment  & Plan :     Acute lower GI bleed with colonoscopy done on 06/27/2021 showing large transverse colon mass with acute blood loss related anemia lower GI bleed- he received 1 unit of packed RBC transfusion earlier this admission, seen by GI and general surgery, he underwent colonoscopy and which was positive for colon mass biopsy suggestive of poorly differentiated adenocarcinoma, he was subsequently seen by general surgery and underwent surgical resection of the mass on 06/30/2021 by general surgery.  He was subsequently started on heparin infusion and he tolerated it well for a few days however on 07/03/2021 he started having bright red blood per rectum.  His hemoglobin has dropped on 07/04/2021 and he is getting 2  more units of packed RBCs, heparin will be held, case discussed with general surgery Dr. Bobbye Morton and Dr. Michail Sermon GI on 07/04/2021.  Since his BUN has crept up a little bit will give him IV PPI for now although most likely source is his colon anastomosis.  Clear liquid diet and monitor H&H closely.  2.  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  Present on admission.  S/p colon resection see #1 above, bowel activity has not returned fully, requested to advance activity, will monitor closely on liquid diet per surgery.  3.  CKD 3B.  Baseline creatinine around 1.5.  Monitor closely.  4.  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  5.  MVR.  Coagulation on hold due to #1 above, will hold for a full 24 - 48 hours, discussed risks and benefits with patient and son, also discussed with cardiologist on-call Dr Stanford Breed on 07/04/2021.  Note patient had Saint Jude mechanical mitral valve placed in 1997.  6.  Paroxysmal A. fib [place] vas 2 score of 3.  See #5 above for anticoagulation.  Will place on low-dose Lopressor and monitor.        Condition - Fair  Family Communication  : Richard Spears [phone]  on 07/01/2021, 07/04/2021  Code Status :  DNR  Consults  : General surgery, GI, Oncology,  PUD Prophylaxis :     Procedures  :     Colonoscopy done on 06/27/2021 showing large transverse colon mass.  Colon resection by general surgery on 06/30/2021      Disposition Plan  :    Status is: Inpatient  Remains inpatient appropriate because: Heparin drip, MVR, colon cancer   DVT Prophylaxis  : Heparin drip  SCDs Start: 06/24/21 2115     Lab Results  Component Value Date   PLT 145 (L) 07/04/2021    Diet :  Diet Order             Diet clear liquid Room service appropriate? Yes; Fluid consistency: Thin  Diet effective now                    Inpatient Medications  Scheduled Meds:  acetaminophen  1,000 mg Oral Q6H   Or   acetaminophen  650 mg Rectal Q6H    Chlorhexidine Gluconate Cloth  6 each Topical Daily   docusate sodium  100 mg Oral BID   feeding supplement  1 Container Oral TID BM   ferrous sulfate  325 mg Oral TID WC   metoprolol tartrate  25 mg Oral BID   [START ON 07/07/2021] pantoprazole  40 mg Intravenous Q12H   sodium chloride flush  3 mL Intravenous Q12H   Warfarin - Pharmacist Dosing Inpatient   Does not apply q1600   Continuous Infusions:  heparin Stopped (07/04/21 0238)   pantoprazole 8 mg/hr (07/04/21 0906)   sodium phosphate  Dextrose 5% IVPB     PRN Meds:.fentaNYL (SUBLIMAZE) injection, LORazepam, ondansetron (ZOFRAN) IV, oxyCODONE  Antibiotics  :    Anti-infectives (From admission, onward)    None        Time Spent in minutes  30   Lala Lund M.D on 07/04/2021 at 9:43 AM  To page go to [email or web address]   [facility] -  Office  [phone]  See all Orders from today for further details    Objective:   Vitals:   07/04/21 0622 07/04/21 0659 07/04/21 0700 07/04/21 0731  BP: 127/79 129/73 129/73 (!) 121/58  Pulse:  (!) 103 (!) 103 (!) 101  Resp: 14 16 16 15   Temp: 97.8 F (36.6 C) 98 F (36.7 C) 98 F (36.7 C) 97.6 F (36.4 C)  TempSrc: Oral  Oral Oral  SpO2: 98%  98% 100%  Weight:      Height:        Wt Readings from Last 3 Encounters:  06/27/21 68 kg  04/28/21  68.1 kg  03/06/21 72 kg     Intake/Output Summary (Last 24 hours) at 07/04/2021 0943 Last data filed at 07/04/2021 0917 Gross per 24 hour  Intake 1815.66 ml  Output 2800 ml  Net -984.34 ml     Physical Exam  Awake Alert, No new F.N deficits, Normal affect [place].AT,PERRAL Supple Neck, No JVD,   Symmetrical Chest wall movement, Good air movement bilaterally, CTAB RRR,No new Murmurs,  +ve bowel sounds, midline infraumbilical abdominal scar stable, large soft chronic left inguinal hernia.   No Cyanosis, Clubbing or edema     Data Review:    CBC Recent Labs  Lab 07/01/21 0345 07/02/21 0140 07/03/21 0144  07/03/21 2011 07/04/21 0109  WBC 8.4 5.7 5.0 4.9 4.1  HGB 10.1* 9.2* 7.6* 7.6* 6.7*  HCT 30.3* 27.4* 23.0* 23.5* 20.1*  PLT 161 145* 133* 162 145*  MCV 94.4 94.8 95.4 96.3 96.2  MCH 31.5 31.8 31.5 31.1 32.1  MCHC 33.3 33.6 33.0 32.3 33.3  RDW 18.0* 18.1* 18.3* 18.3* 18.3*  LYMPHSABS  --  0.7 0.8  --  0.7  MONOABS  --  0.4 0.3  --  0.3  EOSABS  --  0.1 0.2  --  0.2  BASOSABS  --  0.0 0.0  --  0.0    Electrolytes Recent Labs  Lab 06/29/21 0255 06/30/21 0256 07/01/21 0345 07/02/21 0140 07/03/21 0144 07/04/21 0109 07/04/21 0110  NA 135 134* 131* 130* 130* 134*  --   K 4.1 4.3 4.8 4.5 4.5 4.1  --   CL 105 105 105 103 102 108  --   CO2 21* 21* 19* 22 23 23   --   GLUCOSE 88 84 174* 102* 87 133*  --   BUN 10 10 20  24* 23 27*  --   CREATININE 1.32* 1.41* 1.49* 1.26* 1.18 1.20  --   CALCIUM 8.0* 8.3* 8.2* 7.9* 7.8* 8.1*  --   AST  --   --  24 22 20 17   --   ALT  --   --  12 8 11 11   --   ALKPHOS  --   --  48 45 43 43  --   BILITOT  --   --  0.6 0.8 0.5 0.4  --   ALBUMIN  --   --  2.4* 2.2* 2.0* 1.9*  --   MG 1.9  --  2.0 1.9 1.8 2.0  --   INR  --   --   --   --   --  1.3*  --   BNP  --   --   --  270.0* 582.9*  --  526.5*    ------------------------------------------------------------------------------------------------------------------ No results for input(s): CHOL, HDL, LDLCALC, TRIG, CHOLHDL, LDLDIRECT in the last 72 hours.  No results found for: HGBA1C  No results for input(s): TSH, T4TOTAL, T3FREE, THYROIDAB in the last 72 hours.  Invalid input(s): FREET3 ------------------------------------------------------------------------------------------------------------------ ID Labs Recent Labs  Lab 06/30/21 0256 07/01/21 0345 07/02/21 0140 07/03/21 0144 07/03/21 2011 07/04/21 0109  WBC 3.4* 8.4 5.7 5.0 4.9 4.1  PLT 160 161 145* 133* 162 145*  CREATININE 1.41* 1.49* 1.26* 1.18  --  1.20   Cardiac Enzymes No results for input(s): CKMB, TROPONINI, MYOGLOBIN in  the last 168 hours.  Invalid input(s): CK   Radiology Reports CT CHEST WO CONTRAST  Result Date: 07/02/2021 CLINICAL DATA:  Colon cancer, staging. EXAM: CT CHEST WITHOUT CONTRAST TECHNIQUE: Multidetector CT imaging of the chest was performed following the  standard protocol without IV contrast. COMPARISON:  No prior chest CT. FINDINGS: Cardiovascular: Advanced atherosclerosis of the thoracic aorta. No aortic aneurysm. Descending aorta is tortuous. Multi chamber cardiomegaly. Mitral valve replacement. There are coronary artery calcifications. No pericardial effusion. Prominence of the central pulmonary artery at 3.4 cm. Mediastinum/Nodes: No enlarged mediastinal lymph nodes, paucity of body and mediastinal fat limits detailed assessment. There is no bulky hilar adenopathy on this unenhanced exam. No gross esophageal wall thickening. Upper esophagus slightly patulous. Lungs/Pleura: No pulmonary mass or suspicious nodule. Calcified granuloma in the anterior subpleural right middle lobe. Trace bilateral pleural effusions and associated atelectasis. Linear subsegmental atelectasis in the lingula. No confluent consolidation. Upper Abdomen: Free air in the upper abdomen likely related to recent partial colectomy. High-density material in the gallbladder was not seen on prior abdominal CT and likely reflect bones are carious excretion of contrast. Musculoskeletal: Prior median sternotomy. L1 inferior endplate compression fracture. No blastic or destructive lytic lesion. Generalized paucity of body fat. IMPRESSION: 1. No evidence of metastatic disease in the thorax. 2. Trace bilateral pleural effusions and associated atelectasis. 3. Multi chamber cardiomegaly. Coronary artery calcifications. Prominence of the central pulmonary artery suggesting pulmonary arterial hypertension. Aortic Atherosclerosis (ICD10-I70.0). Electronically Signed   By: Keith Rake M.D.   On: 07/02/2021 22:09

## 2021-07-04 NOTE — Progress Notes (Signed)
ANTICOAGULATION CONSULT NOTE  Pharmacy Consult for Heparin Indication:  mechanical mitral valve and atrial fibrillation  Allergies  Allergen Reactions   Flexeril [Cyclobenzaprine] Diarrhea    Patient Measurements: Height: 6' (182.9 cm) Weight: 68 kg (149 lb 14.6 oz) IBW/kg (Calculated) : 77.6  Heparin Dosing Weight: 68 kg  Vital Signs: Temp: 97.6 F (36.4 C) (01/07 0731) Temp Source: Oral (01/07 0731) BP: 121/58 (01/07 0731) Pulse Rate: 101 (01/07 0731)  Labs: Recent Labs    07/02/21 0140 07/02/21 1102 07/02/21 1957 07/03/21 0144 07/03/21 2011 07/04/21 0109 07/04/21 0110  HGB 9.2*  --   --  7.6* 7.6* 6.7*  --   HCT 27.4*  --   --  23.0* 23.5* 20.1*  --   PLT 145*  --   --  133* 162 145*  --   LABPROT  --   --   --   --   --  16.0*  --   INR  --   --   --   --   --  1.3*  --   HEPARINUNFRC 0.25*   < > 0.38 0.40  --   --  0.52  CREATININE 1.26*  --   --  1.18  --  1.20  --    < > = values in this interval not displayed.     Estimated Creatinine Clearance: 37 mL/min (by C-G formula based on SCr of 1.2 mg/dL).   Assessment: 88 yo M admitted with GIB on 12/28. PTA warfarin for mechanical mitral valve and atrial fibrillation. INR 3.9 on admission > IV vitamin k 5mg  12/29 > INR 1.2 on recheck. Pharmacy consulted to start IV heparin while warfarin is on hold, warfarin resumed 1/6.   [facility] 12/14 regimen: warfarin 5mg  daily except 2.5mg  M/F, INR goal 2.5-3.5    This morning around 0500 the patient had bloody stool and Hgb dropped to 6.7. INR currently 1.3, HL 0.52 this am. Pt received 2u PRBCs. Dr. Candiss Norse discussed with cardiology and the plan is for nursing to hold all anticoagulation for at least 24-48 hours has passed and bleeding has fully resolved.   Goal of Therapy:  Heparin level 0.3-0.5 units/ml Monitor platelets by anticoagulation protocol: Yes   Plan:   Hold heparin and warfarin for 24-48 hours Continue to monitor H&H and platelets    Donald Pore, PharmD Pharmacy Resident 07/04/2021, 10:09 AM

## 2021-07-04 NOTE — Progress Notes (Signed)
Oncology brief note   I stopped by and discussed the surgical pathology with patient today.  He had T2N0 stage I colon cancer, his prognosis is great, risk of recurrence is low, I do not recommend adjuvant chemotherapy.  I encouraged him to focus on recovery, and I will see him back in my office in a few months.  [name]  07/04/2021

## 2021-07-04 NOTE — Progress Notes (Signed)
°  X-cover Note: Notified by bedside Rn that pt continues to have bright red blood per rectum. RN states she was notified at shift change that pt was having bloody stools yesterday.  HgB dropping. Will need to hold heparin for now.   Will transfuse with 2 units PRBC.   Kristopher Oppenheim, DO [facility]

## 2021-07-04 NOTE — Progress Notes (Signed)
[facility] Progress Note  Richard Spears 88 y.o. 1925-11-06   Subjective: Two episodes of bright red blood per rectum overnight and denies any today. Denies abdominal pain.  Objective: Vital signs: Vitals:   07/04/21 1000 07/04/21 1142  BP: 125/60 109/75  Pulse: 99 86  Resp: 19 18  Temp:  98 F (36.7 C)  SpO2:  97%    Physical Exam: Gen: lethargic, elderly, thin, no acute distress, pleasant HEENT: anicteric sclera CV: RRR Chest: CTA B Abd: nontender, soft, midline surgical dressing, +BS Ext: no edema  Lab Results: Recent Labs    07/03/21 0144 07/04/21 0109  NA 130* 134*  K 4.5 4.1  CL 102 108  CO2 23 23  GLUCOSE 87 133*  BUN 23 27*  CREATININE 1.18 1.20  CALCIUM 7.8* 8.1*  MG 1.8 2.0  PHOS 2.5 2.1*   Recent Labs    07/03/21 0144 07/04/21 0109  AST 20 17  ALT 11 11  ALKPHOS 43 43  BILITOT 0.5 0.4  PROT 4.8* 4.7*  ALBUMIN 2.0* 1.9*   Recent Labs    07/04/21 0109 07/04/21 1246  WBC 4.1 4.5  NEUTROABS 2.7 3.3  HGB 6.7* 8.7*  HCT 20.1* 25.8*  MCV 96.2 91.8  PLT 145* 134*      Assessment/Plan: S/P partial colectomy for adenocarcinoma (06/30/20) who has been on Heparin drip for mechanical heart valve and was restarted on Coumadin yesterday and started having BRBPR. Suspect bleeding is related to anastomosis or colonic mucosa and doubt an upper tract bleed. Ok to do IV PPI Q 12 hours today. Agree with holding Coumadin. Defer continuing Heparin drip to primary team. No role for EGD. Advance diet per surgery. Call us back if needed.   Richard Spears 07/04/2021, 2:06 PM  Questions please call [phone] Patient ID: Richard Spears, male   DOB: 1925-09-01, 88 y.o.   MRN: [id number]

## 2021-07-04 NOTE — Progress Notes (Signed)
Trauma/Critical Care Follow Up Note  Subjective:    Overnight Issues:   Objective:  Vital signs for last 24 hours: Temp:  [97.6 F (36.4 C)-98.2 F (36.8 C)] 97.6 F (36.4 C) (01/07 0731) Pulse Rate:  [63-103] 101 (01/07 0731) Resp:  [14-20] 15 (01/07 0731) BP: (105-153)/(50-96) 121/58 (01/07 0731) SpO2:  [93 %-100 %] 100 % (01/07 0731)  Hemodynamic parameters for last 24 hours:    Intake/Output from previous day: 01/06 0701 - 01/07 0700 In: 2115.7 [P.O.:1137; I.V.:389.7; Blood:589] Out: 2600 [Urine:2600]  Intake/Output this shift: Total I/O In: -  Out: 300 [Urine:300]  Vent settings for last 24 hours:    Physical Exam:  Gen: comfortable, no distress Neuro: non-focal exam HEENT: PERRL Neck: supple CV: RRR Pulm: unlabored breathing Abd: soft, NT, minimal drainage on dressing GU: clear yellow urine Extr: wwp, no edema   Results for orders placed or performed during the hospital encounter of 06/24/21 (from the past 24 hour(s))  CBC     Status: Abnormal   Collection Time: 07/03/21  8:11 PM  Result Value Ref Range   WBC 4.9 4.0 - 10.5 K/uL   RBC 2.44 (L) 4.22 - 5.81 MIL/uL   Hemoglobin 7.6 (L) 13.0 - 17.0 g/dL   HCT 23.5 (L) 39.0 - 52.0 %   MCV 96.3 80.0 - 100.0 fL   MCH 31.1 26.0 - 34.0 pg   MCHC 32.3 30.0 - 36.0 g/dL   RDW 18.3 (H) 11.5 - 15.5 %   Platelets 162 150 - 400 K/uL   nRBC 0.4 (H) 0.0 - 0.2 %  Type and screen [place]     Status: None (Preliminary result)   Collection Time: 07/03/21  8:11 PM  Result Value Ref Range   ABO/RH(D) O POS    Antibody Screen NEG    Sample Expiration      07/06/2021,2359 Performed at [facility], [address], [place], [place] [address]    Unit Number [id number]    Blood Component Type RED CELLS,LR    Unit division 00    Status of Unit ISSUED    Transfusion Status OK TO TRANSFUSE    Crossmatch Result Compatible    Unit Number [id number]    Blood Component Type RED CELLS,LR     Unit division 00    Status of Unit ISSUED    Transfusion Status OK TO TRANSFUSE    Crossmatch Result Compatible   Magnesium     Status: None   Collection Time: 07/04/21  1:09 AM  Result Value Ref Range   Magnesium 2.0 1.7 - 2.4 mg/dL  Comprehensive metabolic panel     Status: Abnormal   Collection Time: 07/04/21  1:09 AM  Result Value Ref Range   Sodium 134 (L) 135 - 145 mmol/L   Potassium 4.1 3.5 - 5.1 mmol/L   Chloride 108 98 - 111 mmol/L   CO2 23 22 - 32 mmol/L   Glucose, Bld 133 (H) 70 - 99 mg/dL   BUN 27 (H) 8 - 23 mg/dL   Creatinine, Ser 1.20 0.61 - 1.24 mg/dL   Calcium 8.1 (L) 8.9 - 10.3 mg/dL   Total Protein 4.7 (L) 6.5 - 8.1 g/dL   Albumin 1.9 (L) 3.5 - 5.0 g/dL   AST 17 15 - 41 U/L   ALT 11 0 - 44 U/L   Alkaline Phosphatase 43 38 - 126 U/L   Total Bilirubin 0.4 0.3 - 1.2 mg/dL   GFR, Estimated  56 (L) >60 mL/min   Anion gap 3 (L) 5 - 15  CBC with Differential/Platelet     Status: Abnormal   Collection Time: 07/04/21  1:09 AM  Result Value Ref Range   WBC 4.1 4.0 - 10.5 K/uL   RBC 2.09 (L) 4.22 - 5.81 MIL/uL   Hemoglobin 6.7 (LL) 13.0 - 17.0 g/dL   HCT 20.1 (L) 39.0 - 52.0 %   MCV 96.2 80.0 - 100.0 fL   MCH 32.1 26.0 - 34.0 pg   MCHC 33.3 30.0 - 36.0 g/dL   RDW 18.3 (H) 11.5 - 15.5 %   Platelets 145 (L) 150 - 400 K/uL   nRBC 0.0 0.0 - 0.2 %   Neutrophils Relative % 66 %   Neutro Abs 2.7 1.7 - 7.7 K/uL   Lymphocytes Relative 17 %   Lymphs Abs 0.7 0.7 - 4.0 K/uL   Monocytes Relative 8 %   Monocytes Absolute 0.3 0.1 - 1.0 K/uL   Eosinophils Relative 5 %   Eosinophils Absolute 0.2 0.0 - 0.5 K/uL   Basophils Relative 0 %   Basophils Absolute 0.0 0.0 - 0.1 K/uL   Immature Granulocytes 4 %   Abs Immature Granulocytes 0.16 (H) 0.00 - 0.07 K/uL  Phosphorus     Status: Abnormal   Collection Time: 07/04/21  1:09 AM  Result Value Ref Range   Phosphorus 2.1 (L) 2.5 - 4.6 mg/dL  Protime-INR     Status: Abnormal   Collection Time: 07/04/21  1:09 AM  Result Value  Ref Range   Prothrombin Time 16.0 (H) 11.4 - 15.2 seconds   INR 1.3 (H) 0.8 - 1.2  Heparin level (unfractionated)     Status: None   Collection Time: 07/04/21  1:10 AM  Result Value Ref Range   Heparin Unfractionated 0.52 0.30 - 0.70 IU/mL  Brain natriuretic peptide     Status: Abnormal   Collection Time: 07/04/21  1:10 AM  Result Value Ref Range   B Natriuretic Peptide 526.5 (H) 0.0 - 100.0 pg/mL  Prepare RBC (crossmatch)     Status: None   Collection Time: 07/04/21  2:40 AM  Result Value Ref Range   Order Confirmation      ORDER PROCESSED BY BLOOD BANK Performed at [facility], [address], [place], [place] [address]   Trauma TEG Panel     Status: Abnormal   Collection Time: 07/04/21  8:20 AM  Result Value Ref Range   Citrated Kaolin (R) 4.4 (L) 4.6 - 9.1 min   Citrated Rapid TEG (MA) 65.2 52 - 70 mm   CFF Max Amplitude 29.6 15 - 32 mm   Lysis at 30 Minutes 0.1 0.0 - 2.6 %    Assessment & Plan:  Present on Admission:  GI bleeding  CKD (chronic kidney disease) stage 3, GFR 30-59 ml/min (HCC)  Longstanding persistent atrial fibrillation: CHA2DS2-VASc Score 3  Essential hypertension  Hyponatremia  Acute blood loss anemia    LOS: 10 days   Additional comments:I reviewed the patient's new clinical lab test results.   and I reviewed the patients new imaging test results.    POD #4, s/p ex lap with segmental TC resection for adenocarcinoma, Dr. Kae Heller 1/3 - surgical path pending - Continue soft diet, add colace - continue PT/OT, mobilize - multi-modal pain control - oncology following - coumadin restarted yest. Do not give any additional doses please. Hold heparin gtt in light of bleeding. 2u PRBC this AM, TEG w/o  transfusable abnormality. Recheck CBC post-transfusion. Monitor rectal bleeding, low threshold for additional product. Appers well and can stay in progressive unit, but low threshold for escalation of care.    FEN - soft diet VTE - heparin gtt>>  coumadin ID - none currently Foley - out 1/5 and voiding   A fib MVR CKD  Richard Oka, MD Trauma & General Surgery Please use [email or web address] to contact on call provider  07/04/2021  *Care during the described time interval was provided by me. I have reviewed this patient's available data, including medical history, events of note, physical examination and test results as part of my evaluation.

## 2021-07-04 NOTE — Progress Notes (Signed)
Patient has bloody stool which per the NT it was a lot but I didn't witness it. Hgb is 6.7 this morning. Notified Dr. Bridgett Larsson with a new order to transfuse 2 units of blood and turn the heparin drip off.

## 2021-07-05 DIAGNOSIS — D62 Acute posthemorrhagic anemia: Secondary | ICD-10-CM | POA: Diagnosis not present

## 2021-07-05 LAB — COMPREHENSIVE METABOLIC PANEL
ALT: 11 U/L (ref 0–44)
AST: 17 U/L (ref 15–41)
Albumin: 2.1 g/dL — ABNORMAL LOW (ref 3.5–5.0)
Alkaline Phosphatase: 37 U/L — ABNORMAL LOW (ref 38–126)
Anion gap: 7 (ref 5–15)
BUN: 21 mg/dL (ref 8–23)
CO2: 23 mmol/L (ref 22–32)
Calcium: 8.1 mg/dL — ABNORMAL LOW (ref 8.9–10.3)
Chloride: 108 mmol/L (ref 98–111)
Creatinine, Ser: 1.16 mg/dL (ref 0.61–1.24)
GFR, Estimated: 59 mL/min — ABNORMAL LOW (ref >60)
Glucose, Bld: 96 mg/dL (ref 70–99)
Potassium: 4.2 mmol/L (ref 3.5–5.1)
Sodium: 138 mmol/L (ref 135–145)
Total Bilirubin: 0.6 mg/dL (ref 0.3–1.2)
Total Protein: 4.9 g/dL — ABNORMAL LOW (ref 6.5–8.1)

## 2021-07-05 LAB — CBC WITH DIFFERENTIAL/PLATELET
Abs Immature Granulocytes: 0.15 10*3/uL — ABNORMAL HIGH (ref 0.00–0.07)
Abs Immature Granulocytes: 0.17 10*3/uL — ABNORMAL HIGH (ref 0.00–0.07)
Basophils Absolute: 0 10*3/uL (ref 0.0–0.1)
Basophils Absolute: 0 10*3/uL (ref 0.0–0.1)
Basophils Relative: 0 %
Basophils Relative: 1 %
Eosinophils Absolute: 0.2 10*3/uL (ref 0.0–0.5)
Eosinophils Absolute: 0.2 10*3/uL (ref 0.0–0.5)
Eosinophils Relative: 4 %
Eosinophils Relative: 5 %
HCT: 25.8 % — ABNORMAL LOW (ref 39.0–52.0)
HCT: 27.1 % — ABNORMAL LOW (ref 39.0–52.0)
Hemoglobin: 8.7 g/dL — ABNORMAL LOW (ref 13.0–17.0)
Hemoglobin: 8.8 g/dL — ABNORMAL LOW (ref 13.0–17.0)
Immature Granulocytes: 3 %
Immature Granulocytes: 5 %
Lymphocytes Relative: 12 %
Lymphocytes Relative: 15 %
Lymphs Abs: 0.5 10*3/uL — ABNORMAL LOW (ref 0.7–4.0)
Lymphs Abs: 0.6 10*3/uL — ABNORMAL LOW (ref 0.7–4.0)
MCH: 31 pg (ref 26.0–34.0)
MCH: 30.4 pg (ref 26.0–34.0)
MCHC: 33.7 g/dL (ref 30.0–36.0)
MCHC: 32.5 g/dL (ref 30.0–36.0)
MCV: 91.8 fL (ref 80.0–100.0)
MCV: 93.8 fL (ref 80.0–100.0)
Monocytes Absolute: 0.3 10*3/uL (ref 0.1–1.0)
Monocytes Absolute: 0.3 10*3/uL (ref 0.1–1.0)
Monocytes Relative: 8 %
Monocytes Relative: 8 %
Neutro Abs: 3.3 10*3/uL (ref 1.7–7.7)
Neutro Abs: 2.6 10*3/uL (ref 1.7–7.7)
Neutrophils Relative %: 73 %
Neutrophils Relative %: 66 %
Platelets: 134 10*3/uL — ABNORMAL LOW (ref 150–400)
Platelets: 125 10*3/uL — ABNORMAL LOW (ref 150–400)
RBC: 2.81 MIL/uL — ABNORMAL LOW (ref 4.22–5.81)
RBC: 2.89 MIL/uL — ABNORMAL LOW (ref 4.22–5.81)
RDW: 19.9 % — ABNORMAL HIGH (ref 11.5–15.5)
RDW: 20.3 % — ABNORMAL HIGH (ref 11.5–15.5)
WBC: 4.5 10*3/uL (ref 4.0–10.5)
WBC: 3.8 10*3/uL — ABNORMAL LOW (ref 4.0–10.5)
nRBC: 0.4 % — ABNORMAL HIGH (ref 0.0–0.2)
nRBC: 1.1 % — ABNORMAL HIGH (ref 0.0–0.2)

## 2021-07-05 LAB — BPAM RBC
Blood Product Expiration Date: 202301252359
Blood Product Expiration Date: 202301252359
ISSUE DATE / TIME: 202301070308
ISSUE DATE / TIME: 202301070636
Unit Type and Rh: 5100
Unit Type and Rh: 5100

## 2021-07-05 LAB — TYPE AND SCREEN
ABO/RH(D): O POS
Antibody Screen: NEGATIVE
Unit division: 0
Unit division: 0

## 2021-07-05 LAB — CBC
HCT: 25.2 % — ABNORMAL LOW (ref 39.0–52.0)
Hemoglobin: 8.5 g/dL — ABNORMAL LOW (ref 13.0–17.0)
MCH: 30.7 pg (ref 26.0–34.0)
MCHC: 33.7 g/dL (ref 30.0–36.0)
MCV: 91 fL (ref 80.0–100.0)
Platelets: 150 10*3/uL (ref 150–400)
RBC: 2.77 MIL/uL — ABNORMAL LOW (ref 4.22–5.81)
RDW: 20.3 % — ABNORMAL HIGH (ref 11.5–15.5)
WBC: 3.7 10*3/uL — ABNORMAL LOW (ref 4.0–10.5)
nRBC: 0.8 % — ABNORMAL HIGH (ref 0.0–0.2)

## 2021-07-05 LAB — PROTIME-INR
INR: 1.3 — ABNORMAL HIGH (ref 0.8–1.2)
Prothrombin Time: 16.5 seconds — ABNORMAL HIGH (ref 11.4–15.2)

## 2021-07-05 LAB — PHOSPHORUS: Phosphorus: 3.3 mg/dL (ref 2.5–4.6)

## 2021-07-05 LAB — MAGNESIUM: Magnesium: 1.8 mg/dL (ref 1.7–2.4)

## 2021-07-05 LAB — BRAIN NATRIURETIC PEPTIDE: B Natriuretic Peptide: 613.6 pg/mL — ABNORMAL HIGH (ref 0.0–100.0)

## 2021-07-05 LAB — HEPARIN LEVEL (UNFRACTIONATED): Heparin Unfractionated: <0.1 IU/mL — ABNORMAL LOW (ref 0.30–0.70)

## 2021-07-05 NOTE — Progress Notes (Signed)
Richard Spears Progress Note  Richard Spears 88 y.o. 17-Jan-1926   Subjective: Rectal bleeding reported yesterday afternoon but he does not recall. Denies any bleeding or BMs overnight. Denies abdominal pain.  Objective: Vital signs: Vitals:   07/05/21 0337 07/05/21 0726  BP: 129/63 126/86  Pulse: 70 88  Resp: 19 20  Temp: 98.4 F (36.9 C) 97.8 F (36.6 C)  SpO2: 98% 97%    Physical Exam: Gen: lethargic, elderly, frail, thin, no acute distress, pleasant HEENT: anicteric sclera CV: RRR Chest: CTA B Abd: soft, nontender, +distention, surgical dressing intact Ext: no edema  Lab Results: Recent Labs    07/04/21 0109 07/05/21 0053  NA 134* 138  K 4.1 4.2  CL 108 108  CO2 23 23  GLUCOSE 133* 96  BUN 27* 21  CREATININE 1.20 1.16  CALCIUM 8.1* 8.1*  MG 2.0 1.8  PHOS 2.1* 3.3   Recent Labs    07/04/21 0109 07/05/21 0053  AST 17 17  ALT 11 11  ALKPHOS 43 37*  BILITOT 0.4 0.6  PROT 4.7* 4.9*  ALBUMIN 1.9* 2.1*   Recent Labs    07/04/21 1246 07/04/21 1842 07/05/21 0053  WBC 4.5 3.8* 3.7*  NEUTROABS 3.3 2.5  --   HGB 8.7* 8.2* 8.5*  HCT 25.8* 24.7* 25.2*  MCV 91.8 91.5 91.0  PLT 134* 123* 150      Assessment/Plan: S/P partial colectomy for adenocarcinoma with rectal bleeding last 2 days after starting on Coumadin. IV Hep resumed today due to mech heart valve. Diet per surgery. No new GI recs. Will sign off. Call if questions.   [name] 07/05/2021, 11:14 AM  Questions please call [phone] Patient ID: Richard Spears, male   DOB: Jan 11, 1926, 88 y.o.   MRN: [id number]

## 2021-07-05 NOTE — Progress Notes (Signed)
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 PSH -  has a past surgical history that includes hip replacements (Left); Electrodesiccation and Curettage and Shave Biopsy (Right); transthoracic echocardiogram (12/2018); Mitral valve replacement (03/1996); transthoracic echocardiogram (08/'17; 10/'18); Total hip arthroplasty (Right, 10/12/2017); ORIF femur fracture (Left, 08/28/2020); Colonoscopy with propofol (N/A, 06/27/2021); biopsy (06/27/2021); and Partial colectomy (N/A, 06/30/2021).  [facility] - family history includes COPD in his brother; Cancer in his brother; Hypertension in his mother; Lung cancer in his sister; Lupus in his son; Other in his sister.   Objective: Vital signs in last 24 hours: Temp:  [97.8 F (36.6 C)-98.4 F (36.9 C)] 97.8 F (36.6 C) (01/08 0726) Pulse Rate:  [70-99] 88 (01/08 0726) Resp:  [18-20] 20 (01/08 0726) BP: (109-153)/(53-86) 126/86 (01/08 0726) SpO2:  [96 %-99 %] 97 % (01/08 0726) Last BM Date: 07/04/21 Intake/Output from previous day: 01/07 0701 - 01/08 0700 In: 1014.4 [P.O.:237; I.V.:84.8; Blood:332; IV Piggyback:360.6] Out: 7425 [ZDGLO:7564] Intake/Output this shift: No intake/output data recorded.  PE: Gen: NAd Resp: nonlabored Abd: incision intact, moderate distension of abdomen, soft left inguinal hernia  Lab Results:  Recent Labs     07/04/21 1842 07/05/21 0053  WBC 3.8* 3.7*  HGB 8.2* 8.5*  HCT 24.7* 25.2*  PLT 123* 150   BMET Recent Labs    07/04/21 0109 07/05/21 0053  NA 134* 138  K 4.1 4.2  CL 108 108  CO2 23 23  GLUCOSE 133* 96  BUN 27* 21  CREATININE 1.20 1.16  CALCIUM 8.1* 8.1*   PT/INR Recent Labs    07/04/21 0109 07/05/21 0053  LABPROT 16.0* 16.5*  INR 1.3* 1.3*   CMP     Component Value Date/Time   NA 138 07/05/2021 0053   NA 134 (A) 10/23/2020 1035   NA 130 10/20/2017 0000   K 4.2 07/05/2021 0053   K 4.2 10/20/2017 0000   CL 108 07/05/2021 0053   CO2 23 07/05/2021 0053   GLUCOSE 96 07/05/2021 0053   BUN 21 07/05/2021 0053   BUN 33 (A) 10/23/2020 1035   CREATININE 1.16 07/05/2021 0053   CREATININE 1.38 10/20/2017 0000   CALCIUM 8.1 (L) 07/05/2021 0053   CALCIUM 8.6 10/20/2017 0000   PROT 4.9 (L) 07/05/2021 0053   ALBUMIN 2.1 (L) 07/05/2021 0053   AST 17 07/05/2021 0053   ALT 11 07/05/2021 0053   ALKPHOS 37 (L) 07/05/2021 0053   BILITOT 0.6 07/05/2021 0053   GFRNONAA 59 (L) 07/05/2021 0053   GFRAA 61 10/23/2020 1035   Lipase     Component Value Date/Time   LIPASE 34 06/24/2021 1220    Studies/Results: No results found.  Anti-infectives: Anti-infectives (From admission, onward)    None       Assessment/Plan POD #5, s/p ex lap with segmental TC resection for adenocarcinoma, Dr. Kae Heller 1/3 - surgical path T2n0 adenocarcinoma - Continue soft diet, add colace - continue PT/OT, mobilize - multi-modal pain control Acute Blood Loss Anemia after surgery - received 2  units of blood yesterday, patient has a fib and St Jude mitral valve (1997) on coumadin. Received 1 dose coumadin on 1/6. Had been on heparin drip which was held for hgb 6.7 on 1/7. -restart heparin today -continue to check daily CBC -will watch for further hematochezia   FEN - soft diet VTE - restart heparin gtt, continue to hold coumadin, chronic anticoagulation on coumadin for St jude valve ID -  none currently Foley - out 1/5 and voiding   A fib MVR CKD   LOS: 11 days   I reviewed last 24 h vitals and pain scores, last 48 h intake and output, last 24 h labs and trends, and last 24 h imaging results.  This care required high  level of medical decision making.   [facility] 07/05/2021, 9:20 AM Please see Amion for pager number during day hours 7:00am-4:30pm or 7:00am -11:30am on weekends

## 2021-07-05 NOTE — Progress Notes (Signed)
PROGRESS NOTE                                                                                                                                                                                                             Patient Demographics:    Richard Spears, is a 88 y.o. male, DOB - 01-12-1926, ULA:[id number]  Outpatient Primary MD for the patient is [name], MD    LOS - 46  Admit date - 06/24/2021    Chief Complaint  Patient presents with   GI Problem       Brief Narrative (HPI from H&P)   - 89/M from independent living with spouse with history of paroxysmal atrial fibrillation and mechanical mitral valve replacement with St. Jude's metallic valve on Coumadin, chronic anemia, hypertension, chronic diastolic CHF, presented to the ED with 2 episodes of hematochezia, further work-up showed large colon mass underwent colon resection.  Currently on heparin drip for MVR.   Subjective:   Patient in bed, appears comfortable, denies any headache, no fever, no chest pain or pressure, no shortness of breath , no abdominal pain. No new focal weakness.  Total 4 bloody BMs but none in the last 20 hours or so.   Assessment  & Plan :     Acute lower GI bleed with colonoscopy done on 06/27/2021 showing large transverse colon mass with acute blood loss related anemia lower GI bleed- he received 1 unit of packed RBC transfusion earlier this admission, seen by GI and general surgery, he underwent colonoscopy and which was positive for colon mass biopsy suggestive of poorly differentiated adenocarcinoma, he was subsequently seen by general surgery and underwent surgical resection of the mass on 06/30/2021 by general surgery.  He was subsequently started on heparin infusion and he tolerated it well for a few days however on 07/03/2021 he started having bright red blood per rectum.  His hemoglobin had dropped on 07/04/2021 and he received 2  more units of packed RBCs, heparin will be held for a total of 48 hours, case discussed with general surgery Dr. Bobbye Morton and Dr. Michail Sermon GI on 07/04/2021 and 07/05/2021.  Since his BUN has crept up a little bit will give him IV PPI for now although most likely source is his colon anastomosis.  Clear liquid diet and monitor H&H closely.  Also discussed with cardiologist  on-call Dr. Stanford Breed on 07/04/2021 and 07/05/2021, also reviewed preop cardiology consult note for holding anticoagulation for 24 to 48 hours if needed preop.   2.  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  Present on admission.  S/p colon resection see #1 above, bowel activity has not returned fully, requested to advance activity, will monitor closely on liquid diet per surgery.  3.  CKD 3B.  Baseline creatinine around 1.5.  Monitor closely.  4.  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  Resolved for now.  5.  MVR.  Coagulation on hold due to #1 above, will hold for a full 24 - 48 hours, discussed risks and benefits with patient and son, also discussed with cardiologist on-call Dr Stanford Breed on 07/04/2021.  Note patient had Saint Jude mechanical mitral valve placed in 1997.  6.  Paroxysmal A. fib [place] vas 2 score of 3.  See #5 above for anticoagulation.  Will place on low-dose Lopressor and monitor.        Condition - Fair  Family Communication  : [name] [phone]  on 07/01/2021, 07/04/2021  Code Status :  DNR  Consults  : General surgery, GI, Oncology,  PUD Prophylaxis :     Procedures  :     Colonoscopy done on 06/27/2021 showing large transverse colon mass.  Colon resection by general surgery on 06/30/2021      Disposition Plan  :    Status is: Inpatient  Remains inpatient appropriate because: Heparin drip, MVR, colon cancer   DVT Prophylaxis  : Heparin drip  Place and maintain sequential compression device Start: 07/04/21 1358 SCDs Start: 06/24/21 2115     Lab Results  Component  Value Date   PLT 150 07/05/2021    Diet :  Diet Order             DIET SOFT Room service appropriate? Yes; Fluid consistency: Thin  Diet effective now                    Inpatient Medications  Scheduled Meds:  acetaminophen  1,000 mg Oral Q6H   Or   acetaminophen  650 mg Rectal Q6H   Chlorhexidine Gluconate Cloth  6 each Topical Daily   docusate sodium  100 mg Oral BID   feeding supplement  1 Container Oral TID BM   ferrous sulfate  325 mg Oral TID WC   metoprolol tartrate  25 mg Oral BID   [START ON 07/07/2021] pantoprazole  40 mg Intravenous Q12H   sodium chloride flush  3 mL Intravenous Q12H   Continuous Infusions:  pantoprazole 8 mg/hr (07/05/21 0523)   PRN Meds:.fentaNYL (SUBLIMAZE) injection, LORazepam, ondansetron (ZOFRAN) IV, oxyCODONE  Antibiotics  :    Anti-infectives (From admission, onward)    None        Time Spent in minutes  30   Lala Lund M.D on 07/05/2021 at 11:53 AM  To page go to [email or web address]   [facility] -  Office  [phone]  See all Orders from today for further details    Objective:   Vitals:   07/04/21 2326 07/05/21 0337 07/05/21 0726 07/05/21 1150  BP: 122/66 129/63 126/86 (!) 119/45  Pulse: 79 70 88 61  Resp: 20 19 20 20   Temp: 98.1 F (36.7 C) 98.4 F (36.9 C) 97.8 F (36.6 C) 97.7 F (36.5 C)  TempSrc: Oral Oral Oral Oral  SpO2: 96% 98% 97% 98%  Weight:      Height:  Wt Readings from Last 3 Encounters:  06/27/21 68 kg  04/28/21 68.1 kg  03/06/21 72 kg     Intake/Output Summary (Last 24 hours) at 07/05/2021 1153 Last data filed at 07/05/2021 0656 Gross per 24 hour  Intake 445.35 ml  Output 1100 ml  Net -654.65 ml     Physical Exam  Awake Alert, No new F.N deficits, Normal affect Trainer.AT,PERRAL Supple Neck, No JVD,   Symmetrical Chest wall movement, Good air movement bilaterally, CTAB RRR,No Gallops, Rubs or new Murmurs,  +ve bowel sounds, midline infraumbilical abdominal scar  stable, large soft chronic left inguinal hernia.    No Cyanosis, Clubbing or edema      Data Review:    CBC Recent Labs  Lab 07/02/21 0140 07/03/21 0144 07/03/21 2011 07/04/21 0109 07/04/21 1246 07/04/21 1842 07/05/21 0053  WBC 5.7 5.0 4.9 4.1 4.5 3.8* 3.7*  HGB 9.2* 7.6* 7.6* 6.7* 8.7* 8.2* 8.5*  HCT 27.4* 23.0* 23.5* 20.1* 25.8* 24.7* 25.2*  PLT 145* 133* 162 145* 134* 123* 150  MCV 94.8 95.4 96.3 96.2 91.8 91.5 91.0  MCH 31.8 31.5 31.1 32.1 31.0 30.4 30.7  MCHC 33.6 33.0 32.3 33.3 33.7 33.2 33.7  RDW 18.1* 18.3* 18.3* 18.3* 19.9* 20.2* 20.3*  LYMPHSABS 0.7 0.8  --  0.7 0.5* 0.6*  --   MONOABS 0.4 0.3  --  0.3 0.3 0.3  --   EOSABS 0.1 0.2  --  0.2 0.2 0.2  --   BASOSABS 0.0 0.0  --  0.0 0.0 0.0  --     Electrolytes Recent Labs  Lab 07/01/21 0345 07/02/21 0140 07/03/21 0144 07/04/21 0109 07/04/21 0110 07/05/21 0053  NA 131* 130* 130* 134*  --  138  K 4.8 4.5 4.5 4.1  --  4.2  CL 105 103 102 108  --  108  CO2 19* 22 23 23   --  23  GLUCOSE 174* 102* 87 133*  --  96  BUN 20 24* 23 27*  --  21  CREATININE 1.49* 1.26* 1.18 1.20  --  1.16  CALCIUM 8.2* 7.9* 7.8* 8.1*  --  8.1*  AST 24 22 20 17   --  17  ALT 12 8 11 11   --  11  ALKPHOS 48 45 43 43  --  37*  BILITOT 0.6 0.8 0.5 0.4  --  0.6  ALBUMIN 2.4* 2.2* 2.0* 1.9*  --  2.1*  MG 2.0 1.9 1.8 2.0  --  1.8  INR  --   --   --  1.3*  --  1.3*  BNP  --  270.0* 582.9*  --  526.5* 613.6*    ------------------------------------------------------------------------------------------------------------------ No results for input(s): CHOL, HDL, LDLCALC, TRIG, CHOLHDL, LDLDIRECT in the last 72 hours.  No results found for: HGBA1C  No results for input(s): TSH, T4TOTAL, T3FREE, THYROIDAB in the last 72 hours.  Invalid input(s): FREET3 ------------------------------------------------------------------------------------------------------------------ ID Labs Recent Labs  Lab 07/01/21 0345 07/02/21 0140 07/03/21 0144  07/03/21 2011 07/04/21 0109 07/04/21 1246 07/04/21 1842 07/05/21 0053  WBC 8.4 5.7 5.0 4.9 4.1 4.5 3.8* 3.7*  PLT 161 145* 133* 162 145* 134* 123* 150  CREATININE 1.49* 1.26* 1.18  --  1.20  --   --  1.16   Cardiac Enzymes No results for input(s): CKMB, TROPONINI, MYOGLOBIN in the last 168 hours.  Invalid input(s): CK   Radiology Reports CT CHEST WO CONTRAST  Result Date: 07/02/2021 CLINICAL DATA:  Colon cancer, staging. EXAM: CT CHEST WITHOUT CONTRAST  TECHNIQUE: Multidetector CT imaging of the chest was performed following the standard protocol without IV contrast. COMPARISON:  No prior chest CT. FINDINGS: Cardiovascular: Advanced atherosclerosis of the thoracic aorta. No aortic aneurysm. Descending aorta is tortuous. Multi chamber cardiomegaly. Mitral valve replacement. There are coronary artery calcifications. No pericardial effusion. Prominence of the central pulmonary artery at 3.4 cm. Mediastinum/Nodes: No enlarged mediastinal lymph nodes, paucity of body and mediastinal fat limits detailed assessment. There is no bulky hilar adenopathy on this unenhanced exam. No gross esophageal wall thickening. Upper esophagus slightly patulous. Lungs/Pleura: No pulmonary mass or suspicious nodule. Calcified granuloma in the anterior subpleural right middle lobe. Trace bilateral pleural effusions and associated atelectasis. Linear subsegmental atelectasis in the lingula. No confluent consolidation. Upper Abdomen: Free air in the upper abdomen likely related to recent partial colectomy. High-density material in the gallbladder was not seen on prior abdominal CT and likely reflect bones are carious excretion of contrast. Musculoskeletal: Prior median sternotomy. L1 inferior endplate compression fracture. No blastic or destructive lytic lesion. Generalized paucity of body fat. IMPRESSION: 1. No evidence of metastatic disease in the thorax. 2. Trace bilateral pleural effusions and associated atelectasis. 3.  Multi chamber cardiomegaly. Coronary artery calcifications. Prominence of the central pulmonary artery suggesting pulmonary arterial hypertension. Aortic Atherosclerosis (ICD10-I70.0). Electronically Signed   By: Keith Rake M.D.   On: 07/02/2021 22:09

## 2021-07-06 ENCOUNTER — Inpatient Hospital Stay (HOSPITAL_COMMUNITY): Payer: Medicare Other

## 2021-07-06 DIAGNOSIS — I4811 Longstanding persistent atrial fibrillation: Secondary | ICD-10-CM | POA: Diagnosis not present

## 2021-07-06 DIAGNOSIS — D62 Acute posthemorrhagic anemia: Secondary | ICD-10-CM | POA: Diagnosis not present

## 2021-07-06 DIAGNOSIS — K922 Gastrointestinal hemorrhage, unspecified: Secondary | ICD-10-CM | POA: Diagnosis not present

## 2021-07-06 DIAGNOSIS — Z952 Presence of prosthetic heart valve: Secondary | ICD-10-CM | POA: Diagnosis not present

## 2021-07-06 LAB — CBC WITH DIFFERENTIAL/PLATELET
Abs Immature Granulocytes: 0.12 10*3/uL — ABNORMAL HIGH (ref 0.00–0.07)
Abs Immature Granulocytes: 0.18 10*3/uL — ABNORMAL HIGH (ref 0.00–0.07)
Basophils Absolute: 0 10*3/uL (ref 0.0–0.1)
Basophils Absolute: 0 10*3/uL (ref 0.0–0.1)
Basophils Relative: 1 %
Basophils Relative: 0 %
Eosinophils Absolute: 0.2 10*3/uL (ref 0.0–0.5)
Eosinophils Absolute: 0.2 10*3/uL (ref 0.0–0.5)
Eosinophils Relative: 5 %
Eosinophils Relative: 5 %
HCT: 25.3 % — ABNORMAL LOW (ref 39.0–52.0)
HCT: 25.9 % — ABNORMAL LOW (ref 39.0–52.0)
Hemoglobin: 8.1 g/dL — ABNORMAL LOW (ref 13.0–17.0)
Hemoglobin: 8.3 g/dL — ABNORMAL LOW (ref 13.0–17.0)
Immature Granulocytes: 3 %
Immature Granulocytes: 4 %
Lymphocytes Relative: 12 %
Lymphocytes Relative: 15 %
Lymphs Abs: 0.4 10*3/uL — ABNORMAL LOW (ref 0.7–4.0)
Lymphs Abs: 0.7 10*3/uL (ref 0.7–4.0)
MCH: 29.9 pg (ref 26.0–34.0)
MCH: 30.2 pg (ref 26.0–34.0)
MCHC: 32 g/dL (ref 30.0–36.0)
MCHC: 32 g/dL (ref 30.0–36.0)
MCV: 93.4 fL (ref 80.0–100.0)
MCV: 94.2 fL (ref 80.0–100.0)
Monocytes Absolute: 0.3 10*3/uL (ref 0.1–1.0)
Monocytes Absolute: 0.4 10*3/uL (ref 0.1–1.0)
Monocytes Relative: 9 %
Monocytes Relative: 9 %
Neutro Abs: 2.6 10*3/uL (ref 1.7–7.7)
Neutro Abs: 3.1 10*3/uL (ref 1.7–7.7)
Neutrophils Relative %: 70 %
Neutrophils Relative %: 67 %
Platelets: 156 10*3/uL (ref 150–400)
Platelets: 152 10*3/uL (ref 150–400)
RBC: 2.71 MIL/uL — ABNORMAL LOW (ref 4.22–5.81)
RBC: 2.75 MIL/uL — ABNORMAL LOW (ref 4.22–5.81)
RDW: 19.9 % — ABNORMAL HIGH (ref 11.5–15.5)
RDW: 19.9 % — ABNORMAL HIGH (ref 11.5–15.5)
WBC: 3.7 10*3/uL — ABNORMAL LOW (ref 4.0–10.5)
WBC: 4.7 10*3/uL (ref 4.0–10.5)
nRBC: 0.5 % — ABNORMAL HIGH (ref 0.0–0.2)
nRBC: 0 % (ref 0.0–0.2)

## 2021-07-06 LAB — COMPREHENSIVE METABOLIC PANEL
ALT: 13 U/L (ref 0–44)
AST: 20 U/L (ref 15–41)
Albumin: 2.1 g/dL — ABNORMAL LOW (ref 3.5–5.0)
Alkaline Phosphatase: 42 U/L (ref 38–126)
Anion gap: 4 — ABNORMAL LOW (ref 5–15)
BUN: 20 mg/dL (ref 8–23)
CO2: 24 mmol/L (ref 22–32)
Calcium: 7.8 mg/dL — ABNORMAL LOW (ref 8.9–10.3)
Chloride: 106 mmol/L (ref 98–111)
Creatinine, Ser: 1.42 mg/dL — ABNORMAL HIGH (ref 0.61–1.24)
GFR, Estimated: 46 mL/min — ABNORMAL LOW (ref >60)
Glucose, Bld: 128 mg/dL — ABNORMAL HIGH (ref 70–99)
Potassium: 4 mmol/L (ref 3.5–5.1)
Sodium: 134 mmol/L — ABNORMAL LOW (ref 135–145)
Total Bilirubin: 0.5 mg/dL (ref 0.3–1.2)
Total Protein: 5.1 g/dL — ABNORMAL LOW (ref 6.5–8.1)

## 2021-07-06 LAB — URINALYSIS, ROUTINE W REFLEX MICROSCOPIC
Bilirubin Urine: NEGATIVE
Glucose, UA: NEGATIVE mg/dL
Hgb urine dipstick: NEGATIVE
Ketones, ur: NEGATIVE mg/dL
Leukocytes,Ua: NEGATIVE
Nitrite: NEGATIVE
Protein, ur: NEGATIVE mg/dL
Specific Gravity, Urine: 1.02 (ref 1.005–1.030)
pH: 6 (ref 5.0–8.0)

## 2021-07-06 LAB — CBC
HCT: 25.1 % — ABNORMAL LOW (ref 39.0–52.0)
HCT: 27.5 % — ABNORMAL LOW (ref 39.0–52.0)
Hemoglobin: 8 g/dL — ABNORMAL LOW (ref 13.0–17.0)
Hemoglobin: 9 g/dL — ABNORMAL LOW (ref 13.0–17.0)
MCH: 30.1 pg (ref 26.0–34.0)
MCH: 30.7 pg (ref 26.0–34.0)
MCHC: 31.9 g/dL (ref 30.0–36.0)
MCHC: 32.7 g/dL (ref 30.0–36.0)
MCV: 94.4 fL (ref 80.0–100.0)
MCV: 93.9 fL (ref 80.0–100.0)
Platelets: 137 10*3/uL — ABNORMAL LOW (ref 150–400)
Platelets: 145 10*3/uL — ABNORMAL LOW (ref 150–400)
RBC: 2.66 MIL/uL — ABNORMAL LOW (ref 4.22–5.81)
RBC: 2.93 MIL/uL — ABNORMAL LOW (ref 4.22–5.81)
RDW: 20 % — ABNORMAL HIGH (ref 11.5–15.5)
RDW: 20.1 % — ABNORMAL HIGH (ref 11.5–15.5)
WBC: 3.7 10*3/uL — ABNORMAL LOW (ref 4.0–10.5)
WBC: 3.7 10*3/uL — ABNORMAL LOW (ref 4.0–10.5)
nRBC: 0.8 % — ABNORMAL HIGH (ref 0.0–0.2)
nRBC: 0 % (ref 0.0–0.2)

## 2021-07-06 LAB — SODIUM, URINE, RANDOM: Sodium, Ur: 107 mmol/L

## 2021-07-06 LAB — PHOSPHORUS: Phosphorus: 3 mg/dL (ref 2.5–4.6)

## 2021-07-06 LAB — URIC ACID: Uric Acid, Serum: 4.5 mg/dL (ref 3.7–8.6)

## 2021-07-06 LAB — PROTIME-INR
INR: 1.4 — ABNORMAL HIGH (ref 0.8–1.2)
Prothrombin Time: 16.8 seconds — ABNORMAL HIGH (ref 11.4–15.2)

## 2021-07-06 LAB — CREATININE, URINE, RANDOM: Creatinine, Urine: 95.72 mg/dL

## 2021-07-06 LAB — MAGNESIUM: Magnesium: 1.9 mg/dL (ref 1.7–2.4)

## 2021-07-06 LAB — HEPARIN LEVEL (UNFRACTIONATED): Heparin Unfractionated: 0.22 IU/mL — ABNORMAL LOW (ref 0.30–0.70)

## 2021-07-06 LAB — OSMOLALITY: Osmolality: 287 mOsm/kg (ref 275–295)

## 2021-07-06 LAB — OSMOLALITY, URINE: Osmolality, Ur: 608 mOsm/kg (ref 300–900)

## 2021-07-06 MED ORDER — HEPARIN (PORCINE) 25000 UT/250ML-% IV SOLN
1250.0000 [IU]/h | INTRAVENOUS | Status: DC
  Administered 2021-07-06: 13:00:00 1200 [IU]/h via INTRAVENOUS
  Administered 2021-07-07 – 2021-07-08 (×2): 1250 [IU]/h via INTRAVENOUS
  Filled 2021-07-06 (×3): qty 250

## 2021-07-06 NOTE — Progress Notes (Signed)
ANTICOAGULATION CONSULT NOTE  Pharmacy Consult for Heparin Indication:  mechanical mitral valve and atrial fibrillation  Allergies  Allergen Reactions   Flexeril [Cyclobenzaprine] Diarrhea    Patient Measurements: Height: 6' (182.9 cm) Weight: 68 kg (149 lb 14.6 oz) IBW/kg (Calculated) : 77.6  Heparin Dosing Weight: 68 kg  Vital Signs: Temp: 97.6 F (36.4 C) (01/09 1242) Temp Source: Oral (01/09 1242) BP: 121/63 (01/09 1242) Pulse Rate: 87 (01/09 1242)  Labs: Recent Labs    07/04/21 0109 07/04/21 0110 07/04/21 1246 07/05/21 0053 07/05/21 1240 07/06/21 0156 07/06/21 0644 07/06/21 1153  HGB 6.7*  --    < > 8.5*   < > 8.0* 9.0* 8.1*  HCT 20.1*  --    < > 25.2*   < > 25.1* 27.5* 25.3*  PLT 145*  --    < > 150   < > 137* 145* 156  LABPROT 16.0*  --   --  16.5*  --  16.8*  --   --   INR 1.3*  --   --  1.3*  --  1.4*  --   --   HEPARINUNFRC  --  0.52  --  <0.10*  --   --   --   --   CREATININE 1.20  --   --  1.16  --   --   --   --    < > = values in this interval not displayed.     Estimated Creatinine Clearance: 38.3 mL/min (by C-G formula based on SCr of 1.16 mg/dL).   Assessment: 88 yo M admitted with GIB on 12/28. PTA warfarin (LD 12/28 @ 0730) for mechanical mitral valve and atrial fibrillation. INR 3.9 on admission > IV vitamin k 5mg  12/29 > INR 1.2 on recheck. Pharmacy consulted to start IV heparin while warfarin is on hold.  [facility] 12/14 regimen: warfarin 5mg  daily except 2.5mg  M/F, INR goal 2.5-3.5    Resuming heparin today after being held multiple days for  GI bleeding HgB stabilized   Goal of Therapy:  Heparin level 0.3-0.5 units/ml Monitor platelets by anticoagulation protocol: Yes   Plan:  Restart heparin at 1200 units / hr Heparin level in 8 hours Daily heparin level, CBC    Thank you Anette Guarneri, PharmD Clinical Pharmacist

## 2021-07-06 NOTE — Progress Notes (Signed)
ANTICOAGULATION CONSULT NOTE  Pharmacy Consult for Heparin Indication:  mechanical mitral valve and atrial fibrillation  Allergies  Allergen Reactions   Flexeril [Cyclobenzaprine] Diarrhea    Patient Measurements: Height: 6' (182.9 cm) Weight: 68 kg (149 lb 14.6 oz) IBW/kg (Calculated) : 77.6  Heparin Dosing Weight: 68 kg  Vital Signs: Temp: 98 F (36.7 C) (01/09 2000) Temp Source: Oral (01/09 2000) BP: 133/67 (01/09 2000) Pulse Rate: 77 (01/09 2000)  Labs: Recent Labs    07/04/21 0109 07/04/21 0110 07/04/21 1246 07/05/21 0053 07/05/21 1240 07/06/21 0156 07/06/21 0644 07/06/21 1153 07/06/21 1216 07/06/21 1928  HGB 6.7*  --    < > 8.5*   < > 8.0* 9.0* 8.1*  --  8.3*  HCT 20.1*  --    < > 25.2*   < > 25.1* 27.5* 25.3*  --  25.9*  PLT 145*  --    < > 150   < > 137* 145* 156  --  152  LABPROT 16.0*  --   --  16.5*  --  16.8*  --   --   --   --   INR 1.3*  --   --  1.3*  --  1.4*  --   --   --   --   HEPARINUNFRC  --  0.52  --  <0.10*  --   --   --   --   --  0.22*  CREATININE 1.20  --   --  1.16  --   --   --   --  1.42*  --    < > = values in this interval not displayed.     Estimated Creatinine Clearance: 31.3 mL/min (A) (by C-G formula based on SCr of 1.42 mg/dL (H)).  Assessment: 88 yo M admitted with GIB on 12/28. PTA warfarin (LD 12/28 @ 0730) for mechanical mitral valve and atrial fibrillation. INR 3.9 on admission > IV vitamin k 5mg  12/29 > INR 1.2 on recheck. Pharmacy consulted to start IV heparin while warfarin is on hold.  [facility] 12/14 regimen: warfarin 5mg  daily except 2.5mg  M/F, INR goal 2.5-3.5    Resuming heparin today after being held multiple days for  GI bleeding HgB stabilized  Heparin level tonight was below goal at 0.22 on 1200 units/hr. No bleeding or IV issues noted.   Goal of Therapy:  Heparin level 0.3-0.5 units/ml Monitor platelets by anticoagulation protocol: Yes   Plan:  Increase heparin to 1300 units / hr Heparin level in 8  hours Daily heparin level, CBC   Erin Hearing PharmD., BCPS Clinical Pharmacist 07/06/2021 10:38 PM

## 2021-07-06 NOTE — Progress Notes (Signed)
6 Days Post-Op   Subjective/Chief Complaint: No complaints this morning Tolerating a soft diet No blood per rectum last night    Objective: Vital signs in last 24 hours: Temp:  [97.4 F (36.3 C)-97.9 F (36.6 C)] 97.6 F (36.4 C) (01/09 0748) Pulse Rate:  [61-100] 100 (01/09 0748) Resp:  [15-20] 15 (01/09 0748) BP: (119-141)/(45-87) 134/87 (01/09 0748) SpO2:  [96 %-100 %] 96 % (01/09 0748) Last BM Date: 07/04/21  Intake/Output from previous day: 01/08 0701 - 01/09 0700 In: -  Out: 7867 [Urine:1475] Intake/Output this shift: No intake/output data recorded.  Exam: Awake and alert Comfortable Abdomen soft, incision dressing dry, minimal tenderness  Lab Results:  Recent Labs    07/06/21 0156 07/06/21 0644  WBC 3.7* 3.7*  HGB 8.0* 9.0*  HCT 25.1* 27.5*  PLT 137* 145*   BMET Recent Labs    07/04/21 0109 07/05/21 0053  NA 134* 138  K 4.1 4.2  CL 108 108  CO2 23 23  GLUCOSE 133* 96  BUN 27* 21  CREATININE 1.20 1.16  CALCIUM 8.1* 8.1*   PT/INR Recent Labs    07/05/21 0053 07/06/21 0156  LABPROT 16.5* 16.8*  INR 1.3* 1.4*   ABG No results for input(s): PHART, HCO3 in the last 72 hours.  Invalid input(s): PCO2, PO2  Studies/Results: No results found.  Anti-infectives: Anti-infectives (From admission, onward)    None       Assessment/Plan: POD #6  s/p ex lap with segmental TC resection for adenocarcinoma, Dr. Kae Heller 1/3  Blood loss from anastomosis post op.  Hgb stable.   No further signs of bleeding.  No futher hematochezia.  Would go slow on advancing coumadin.   Coralie Keens MD 07/06/2021

## 2021-07-06 NOTE — Progress Notes (Signed)
PROGRESS NOTE                                                                                                                                                                                                             Patient Demographics:    Richard Spears, is a 88 y.o. male, DOB - 09-19-1925, GGE:[id number]  Outpatient Primary MD for the patient is Richard Orn, MD    LOS - 12  Admit date - 06/24/2021    Chief Complaint  Patient presents with   GI Problem       Brief Narrative (HPI from H&P)   - 89/M from independent living with spouse with history of paroxysmal atrial fibrillation and mechanical mitral valve replacement with St. Jude's metallic valve on Coumadin, chronic anemia, hypertension, chronic diastolic CHF, presented to the ED with 2 episodes of hematochezia, further work-up showed large colon mass underwent colon resection.  Currently on heparin drip for MVR.   Subjective:   Patient in bed, appears comfortable, denies any headache, no fever, no chest pain or pressure, no shortness of breath , no abdominal pain. No new focal weakness.   Assessment  & Plan :     Acute lower GI bleed with colonoscopy done on 06/27/2021 showing large transverse colon mass with acute blood loss related anemia lower GI bleed- he received 1 unit of packed RBC transfusion earlier this admission, seen by GI and general surgery, he underwent colonoscopy and which was positive for colon mass biopsy suggestive of poorly differentiated adenocarcinoma, he was subsequently seen by general surgery and underwent surgical resection of the mass on 06/30/2021 by general surgery.  He was subsequently started on heparin infusion and he tolerated it well for a few days however on 07/03/2021 he started having bright red blood per rectum requiring 2 units of packed RBC on 07/04/2020, anticoagulation has been held for now 48 hours H&H seems to have stabilized  checking another CBC in the next few minutes if that is stable will start heparin drip without bolus with caution.  2.  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  Present on admission.  S/p colon resection see #1 above, bowel activity has not returned fully, requested to advance activity, will monitor closely on liquid diet per surgery.  3.  CKD 3B.  Baseline creatinine around 1.5.  Monitor closely.  4.  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  Resolved for now.  5.  MVR.  Coagulation on hold due to #1 above, will hold for a full 24 - 48 hours, discussed risks and benefits with patient and son, also discussed with cardiologist on-call Dr Stanford Breed on 07/04/2021.  Kindly see #1 above  6.  Paroxysmal A. fib [place] vas 2 score of 3.  See #1 above for anticoagulation.  Will place on low-dose Lopressor and monitor.        Condition - Fair  Family Communication  : [name] [phone]  on 07/01/2021, 07/04/2021  Code Status :  DNR  Consults  : General surgery, GI, Oncology,  PUD Prophylaxis :     Procedures  :     Colonoscopy done on 06/27/2021 showing large transverse colon mass.  Colon resection by general surgery on 06/30/2021      Disposition Plan  :    Status is: Inpatient  Remains inpatient appropriate because: Heparin drip, MVR, colon cancer   DVT Prophylaxis  : Heparin drip  Place and maintain sequential compression device Start: 07/04/21 1358 SCDs Start: 06/24/21 2115     Lab Results  Component Value Date   PLT 145 (L) 07/06/2021    Diet :  Diet Order             DIET SOFT Room service appropriate? Yes; Fluid consistency: Thin  Diet effective now                    Inpatient Medications  Scheduled Meds:  acetaminophen  1,000 mg Oral Q6H   Or   acetaminophen  650 mg Rectal Q6H   Chlorhexidine Gluconate Cloth  6 each Topical Daily   docusate sodium  100 mg Oral BID   feeding supplement  1 Container Oral TID BM   ferrous  sulfate  325 mg Oral TID WC   metoprolol tartrate  25 mg Oral BID   [START ON 07/07/2021] pantoprazole  40 mg Intravenous Q12H   sodium chloride flush  3 mL Intravenous Q12H   Continuous Infusions:  pantoprazole Stopped (07/06/21 1048)   PRN Meds:.fentaNYL (SUBLIMAZE) injection, LORazepam, ondansetron (ZOFRAN) IV, oxyCODONE  Antibiotics  :    Anti-infectives (From admission, onward)    None        Time Spent in minutes  30   Lala Lund M.D on 07/06/2021 at 12:11 PM  To page go to [email or web address]   [facility] -  Office  [phone]  See all Orders from today for further details    Objective:   Vitals:   07/05/21 1940 07/05/21 2322 07/06/21 0343 07/06/21 0748  BP: (!) 119/57 (!) 141/82  134/87  Pulse: 98 98  100  Resp: 20 19 17 15   Temp: 97.8 F (36.6 C) 97.9 F (36.6 C) 97.9 F (36.6 C) 97.6 F (36.4 C)  TempSrc: Oral Oral Oral Oral  SpO2: 98% 100%  96%  Weight:      Height:        Wt Readings from Last 3 Encounters:  06/27/21 68 kg  04/28/21 68.1 kg  03/06/21 72 kg     Intake/Output Summary (Last 24 hours) at 07/06/2021 1211 Last data filed at 07/06/2021 1155 Gross per 24 hour  Intake 374.08 ml  Output 1475 ml  Net -1100.92 ml     Physical Exam  Awake Alert, No new F.N deficits, Normal affect [place].AT,PERRAL Supple Neck, No JVD,   Symmetrical Chest wall  movement, Good air movement bilaterally, CTAB RRR,No Gallops, Rubs or new Murmurs,  +ve bowel sounds, midline infraumbilical abdominal scar stable, large soft chronic left inguinal hernia.  No Cyanosis, Clubbing or edema      Data Review:    CBC Recent Labs  Lab 07/03/21 0144 07/03/21 2011 07/04/21 0109 07/04/21 1246 07/04/21 1842 07/05/21 0053 07/05/21 1240 07/06/21 0156 07/06/21 0644  WBC 5.0   < > 4.1 4.5 3.8* 3.7* 3.8* 3.7* 3.7*  HGB 7.6*   < > 6.7* 8.7* 8.2* 8.5* 8.8* 8.0* 9.0*  HCT 23.0*   < > 20.1* 25.8* 24.7* 25.2* 27.1* 25.1* 27.5*  PLT 133*   < > 145* 134* 123*  150 125* 137* 145*  MCV 95.4   < > 96.2 91.8 91.5 91.0 93.8 94.4 93.9  MCH 31.5   < > 32.1 31.0 30.4 30.7 30.4 30.1 30.7  MCHC 33.0   < > 33.3 33.7 33.2 33.7 32.5 31.9 32.7  RDW 18.3*   < > 18.3* 19.9* 20.2* 20.3* 20.3* 20.0* 20.1*  LYMPHSABS 0.8  --  0.7 0.5* 0.6*  --  0.6*  --   --   MONOABS 0.3  --  0.3 0.3 0.3  --  0.3  --   --   EOSABS 0.2  --  0.2 0.2 0.2  --  0.2  --   --   BASOSABS 0.0  --  0.0 0.0 0.0  --  0.0  --   --    < > = values in this interval not displayed.    Electrolytes Recent Labs  Lab 07/01/21 0345 07/02/21 0140 07/03/21 0144 07/04/21 0109 07/04/21 0110 07/05/21 0053 07/06/21 0156  NA 131* 130* 130* 134*  --  138  --   K 4.8 4.5 4.5 4.1  --  4.2  --   CL 105 103 102 108  --  108  --   CO2 19* 22 23 23   --  23  --   GLUCOSE 174* 102* 87 133*  --  96  --   BUN 20 24* 23 27*  --  21  --   CREATININE 1.49* 1.26* 1.18 1.20  --  1.16  --   CALCIUM 8.2* 7.9* 7.8* 8.1*  --  8.1*  --   AST 24 22 20 17   --  17  --   ALT 12 8 11 11   --  11  --   ALKPHOS 48 45 43 43  --  37*  --   BILITOT 0.6 0.8 0.5 0.4  --  0.6  --   ALBUMIN 2.4* 2.2* 2.0* 1.9*  --  2.1*  --   MG 2.0 1.9 1.8 2.0  --  1.8  --   INR  --   --   --  1.3*  --  1.3* 1.4*  BNP  --  270.0* 582.9*  --  526.5* 613.6*  --     ------------------------------------------------------------------------------------------------------------------ No results for input(s): CHOL, HDL, LDLCALC, TRIG, CHOLHDL, LDLDIRECT in the last 72 hours.  No results found for: HGBA1C  No results for input(s): TSH, T4TOTAL, T3FREE, THYROIDAB in the last 72 hours.  Invalid input(s): FREET3 ------------------------------------------------------------------------------------------------------------------ ID Labs Recent Labs  Lab 07/01/21 0345 07/02/21 0140 07/03/21 0144 07/03/21 2011 07/04/21 0109 07/04/21 1246 07/04/21 1842 07/05/21 0053 07/05/21 1240 07/06/21 0156 07/06/21 0644  WBC 8.4 5.7 5.0   < > 4.1   < >  3.8* 3.7* 3.8* 3.7* 3.7*  PLT 161 145* 133*   < >  145*   < > 123* 150 125* 137* 145*  CREATININE 1.49* 1.26* 1.18  --  1.20  --   --  1.16  --   --   --    < > = values in this interval not displayed.   Cardiac Enzymes No results for input(s): CKMB, TROPONINI, MYOGLOBIN in the last 168 hours.  Invalid input(s): CK   Radiology Reports CT CHEST WO CONTRAST  Result Date: 07/02/2021 CLINICAL DATA:  Colon cancer, staging. EXAM: CT CHEST WITHOUT CONTRAST TECHNIQUE: Multidetector CT imaging of the chest was performed following the standard protocol without IV contrast. COMPARISON:  No prior chest CT. FINDINGS: Cardiovascular: Advanced atherosclerosis of the thoracic aorta. No aortic aneurysm. Descending aorta is tortuous. Multi chamber cardiomegaly. Mitral valve replacement. There are coronary artery calcifications. No pericardial effusion. Prominence of the central pulmonary artery at 3.4 cm. Mediastinum/Nodes: No enlarged mediastinal lymph nodes, paucity of body and mediastinal fat limits detailed assessment. There is no bulky hilar adenopathy on this unenhanced exam. No gross esophageal wall thickening. Upper esophagus slightly patulous. Lungs/Pleura: No pulmonary mass or suspicious nodule. Calcified granuloma in the anterior subpleural right middle lobe. Trace bilateral pleural effusions and associated atelectasis. Linear subsegmental atelectasis in the lingula. No confluent consolidation. Upper Abdomen: Free air in the upper abdomen likely related to recent partial colectomy. High-density material in the gallbladder was not seen on prior abdominal CT and likely reflect bones are carious excretion of contrast. Musculoskeletal: Prior median sternotomy. L1 inferior endplate compression fracture. No blastic or destructive lytic lesion. Generalized paucity of body fat. IMPRESSION: 1. No evidence of metastatic disease in the thorax. 2. Trace bilateral pleural effusions and associated atelectasis. 3. Multi  chamber cardiomegaly. Coronary artery calcifications. Prominence of the central pulmonary artery suggesting pulmonary arterial hypertension. Aortic Atherosclerosis (ICD10-I70.0). Electronically Signed   By: Keith Rake M.D.   On: 07/02/2021 22:09

## 2021-07-06 NOTE — Progress Notes (Signed)
° ° °  Subjective:  No cardiac complaints   Objective:  Vitals:   07/05/21 1940 07/05/21 2322 07/06/21 0343 07/06/21 0748  BP: (!) 119/57 (!) 141/82  134/87  Pulse: 98 98  100  Resp: 20 19 17 15   Temp: 97.8 F (36.6 C) 97.9 F (36.6 C) 97.9 F (36.6 C) 97.6 F (36.4 C)  TempSrc: Oral Oral Oral Oral  SpO2: 98% 100%  96%  Weight:      Height:        Intake/Output from previous day:  Intake/Output Summary (Last 24 hours) at 07/06/2021 0826 Last data filed at 07/06/2021 0640 Gross per 24 hour  Intake --  Output 1475 ml  Net -1475 ml    Physical Exam: Thin white male S1 click no MR murmur Abdomen soft BS positive post colectomy  No edema Palpable pedal pulses   Lab Results: Basic Metabolic Panel: Recent Labs    07/04/21 0109 07/05/21 0053 07/06/21 0156  NA 134* 138  --   K 4.1 4.2  --   CL 108 108  --   CO2 23 23  --   GLUCOSE 133* 96  --   BUN 27* 21  --   CREATININE 1.20 1.16  --   CALCIUM 8.1* 8.1*  --   MG 2.0 1.8  --   PHOS 2.1* 3.3 3.0   Liver Function Tests: Recent Labs    07/04/21 0109 07/05/21 0053  AST 17 17  ALT 11 11  ALKPHOS 43 37*  BILITOT 0.4 0.6  PROT 4.7* 4.9*  ALBUMIN 1.9* 2.1*   No results for input(s): LIPASE, AMYLASE in the last 72 hours. CBC: Recent Labs    07/04/21 1842 07/05/21 0053 07/05/21 1240 07/06/21 0156 07/06/21 0644  WBC 3.8*   < > 3.8* 3.7* 3.7*  NEUTROABS 2.5  --  2.6  --   --   HGB 8.2*   < > 8.8* 8.0* 9.0*  HCT 24.7*   < > 27.1* 25.1* 27.5*  MCV 91.5   < > 93.8 94.4 93.9  PLT 123*   < > 125* 137* 145*   < > = values in this interval not displayed.    Imaging: No results found.  Cardiac Studies:  ECG: afib chronic    Telemetry:  afib   Echo: pending   Medications:    acetaminophen  1,000 mg Oral Q6H   Or   acetaminophen  650 mg Rectal Q6H   Chlorhexidine Gluconate Cloth  6 each Topical Daily   docusate sodium  100 mg Oral BID   feeding supplement  1 Container Oral TID BM   ferrous sulfate   325 mg Oral TID WC   metoprolol tartrate  25 mg Oral BID   [START ON 07/07/2021] pantoprazole  40 mg Intravenous Q12H   sodium chloride flush  3 mL Intravenous Q12H      pantoprazole 8 mg/hr (07/06/21 0146)    Assessment/Plan:   Afib:  chronic rate control fine coumadin held for surgery and heparin held for post op rectal bleeding ? Resume heparin with no bolus today / tomorrow per GI/surgery  MVR:  88 yo St Jude mechanical valve normal valve clicks on exam Normal function by TTE  GI bleed:  adenocarcinoma color post surgery Has received multiple transfusions Hct 27.5 this am   Jenkins Rouge 07/06/2021, 8:26 AM

## 2021-07-07 DIAGNOSIS — I4811 Longstanding persistent atrial fibrillation: Secondary | ICD-10-CM | POA: Diagnosis not present

## 2021-07-07 DIAGNOSIS — D62 Acute posthemorrhagic anemia: Secondary | ICD-10-CM | POA: Diagnosis not present

## 2021-07-07 DIAGNOSIS — Z952 Presence of prosthetic heart valve: Secondary | ICD-10-CM | POA: Diagnosis not present

## 2021-07-07 LAB — COMPREHENSIVE METABOLIC PANEL
ALT: 14 U/L (ref 0–44)
AST: 19 U/L (ref 15–41)
Albumin: 2 g/dL — ABNORMAL LOW (ref 3.5–5.0)
Alkaline Phosphatase: 44 U/L (ref 38–126)
Anion gap: 6 (ref 5–15)
BUN: 24 mg/dL — ABNORMAL HIGH (ref 8–23)
CO2: 21 mmol/L — ABNORMAL LOW (ref 22–32)
Calcium: 7.7 mg/dL — ABNORMAL LOW (ref 8.9–10.3)
Chloride: 107 mmol/L (ref 98–111)
Creatinine, Ser: 1.24 mg/dL (ref 0.61–1.24)
GFR, Estimated: 54 mL/min — ABNORMAL LOW (ref >60)
Glucose, Bld: 96 mg/dL (ref 70–99)
Potassium: 4.1 mmol/L (ref 3.5–5.1)
Sodium: 134 mmol/L — ABNORMAL LOW (ref 135–145)
Total Bilirubin: 0.7 mg/dL (ref 0.3–1.2)
Total Protein: 4.8 g/dL — ABNORMAL LOW (ref 6.5–8.1)

## 2021-07-07 LAB — CBC WITH DIFFERENTIAL/PLATELET
Abs Immature Granulocytes: 0.13 10*3/uL — ABNORMAL HIGH (ref 0.00–0.07)
Abs Immature Granulocytes: 0.13 10*3/uL — ABNORMAL HIGH (ref 0.00–0.07)
Abs Immature Granulocytes: 0.13 10*3/uL — ABNORMAL HIGH (ref 0.00–0.07)
Basophils Absolute: 0 10*3/uL (ref 0.0–0.1)
Basophils Absolute: 0 10*3/uL (ref 0.0–0.1)
Basophils Absolute: 0 10*3/uL (ref 0.0–0.1)
Basophils Relative: 1 %
Basophils Relative: 0 %
Basophils Relative: 0 %
Eosinophils Absolute: 0.2 10*3/uL (ref 0.0–0.5)
Eosinophils Absolute: 0.3 10*3/uL (ref 0.0–0.5)
Eosinophils Absolute: 0.2 10*3/uL (ref 0.0–0.5)
Eosinophils Relative: 5 %
Eosinophils Relative: 6 %
Eosinophils Relative: 4 %
HCT: 23.9 % — ABNORMAL LOW (ref 39.0–52.0)
HCT: 25 % — ABNORMAL LOW (ref 39.0–52.0)
HCT: 25.4 % — ABNORMAL LOW (ref 39.0–52.0)
Hemoglobin: 7.9 g/dL — ABNORMAL LOW (ref 13.0–17.0)
Hemoglobin: 8.3 g/dL — ABNORMAL LOW (ref 13.0–17.0)
Hemoglobin: 8.3 g/dL — ABNORMAL LOW (ref 13.0–17.0)
Immature Granulocytes: 3 %
Immature Granulocytes: 3 %
Immature Granulocytes: 2 %
Lymphocytes Relative: 14 %
Lymphocytes Relative: 13 %
Lymphocytes Relative: 11 %
Lymphs Abs: 0.7 10*3/uL (ref 0.7–4.0)
Lymphs Abs: 0.6 10*3/uL — ABNORMAL LOW (ref 0.7–4.0)
Lymphs Abs: 0.6 10*3/uL — ABNORMAL LOW (ref 0.7–4.0)
MCH: 31.1 pg (ref 26.0–34.0)
MCH: 31.2 pg (ref 26.0–34.0)
MCH: 30.6 pg (ref 26.0–34.0)
MCHC: 33.1 g/dL (ref 30.0–36.0)
MCHC: 33.2 g/dL (ref 30.0–36.0)
MCHC: 32.7 g/dL (ref 30.0–36.0)
MCV: 94.1 fL (ref 80.0–100.0)
MCV: 94 fL (ref 80.0–100.0)
MCV: 93.7 fL (ref 80.0–100.0)
Monocytes Absolute: 0.5 10*3/uL (ref 0.1–1.0)
Monocytes Absolute: 0.4 10*3/uL (ref 0.1–1.0)
Monocytes Absolute: 0.5 10*3/uL (ref 0.1–1.0)
Monocytes Relative: 10 %
Monocytes Relative: 9 %
Monocytes Relative: 8 %
Neutro Abs: 3.2 10*3/uL (ref 1.7–7.7)
Neutro Abs: 3.1 10*3/uL (ref 1.7–7.7)
Neutro Abs: 4 10*3/uL (ref 1.7–7.7)
Neutrophils Relative %: 67 %
Neutrophils Relative %: 69 %
Neutrophils Relative %: 75 %
Platelets: 140 10*3/uL — ABNORMAL LOW (ref 150–400)
Platelets: 162 10*3/uL (ref 150–400)
Platelets: 179 10*3/uL (ref 150–400)
RBC: 2.54 MIL/uL — ABNORMAL LOW (ref 4.22–5.81)
RBC: 2.66 MIL/uL — ABNORMAL LOW (ref 4.22–5.81)
RBC: 2.71 MIL/uL — ABNORMAL LOW (ref 4.22–5.81)
RDW: 19.6 % — ABNORMAL HIGH (ref 11.5–15.5)
RDW: 19.5 % — ABNORMAL HIGH (ref 11.5–15.5)
RDW: 19.9 % — ABNORMAL HIGH (ref 11.5–15.5)
WBC: 4.8 10*3/uL (ref 4.0–10.5)
WBC: 4.5 10*3/uL (ref 4.0–10.5)
WBC: 5.4 10*3/uL (ref 4.0–10.5)
nRBC: 0 % (ref 0.0–0.2)
nRBC: 0 % (ref 0.0–0.2)
nRBC: 0 % (ref 0.0–0.2)

## 2021-07-07 LAB — PROTIME-INR
INR: 1.3 — ABNORMAL HIGH (ref 0.8–1.2)
Prothrombin Time: 15.9 seconds — ABNORMAL HIGH (ref 11.4–15.2)

## 2021-07-07 LAB — HEPARIN LEVEL (UNFRACTIONATED): Heparin Unfractionated: 0.66 IU/mL (ref 0.30–0.70)

## 2021-07-07 LAB — MAGNESIUM: Magnesium: 1.9 mg/dL (ref 1.7–2.4)

## 2021-07-07 LAB — BRAIN NATRIURETIC PEPTIDE: B Natriuretic Peptide: 530.9 pg/mL — ABNORMAL HIGH (ref 0.0–100.0)

## 2021-07-07 LAB — UREA NITROGEN, URINE: Urea Nitrogen, Ur: 777 mg/dL

## 2021-07-07 NOTE — Progress Notes (Signed)
Patient ID: Richard Spears, male   DOB: 30-Jul-1925, 88 y.o.   MRN: [id number] [facility] Surgery Progress Note  7 Days Post-Op  Subjective: CC-  No complaints. Tolerating diet and drinking Boost. Denies n/v. Passing flatus. No BM yesterday. He had a smear this morning that was maroon in color. Hgb 8.3<<7.9<<8.3  Objective: Vital signs in last 24 hours: Temp:  [97.6 F (36.4 C)-98.2 F (36.8 C)] 98.1 F (36.7 C) (01/10 0753) Pulse Rate:  [74-97] 97 (01/10 0753) Resp:  [16-19] 19 (01/10 0753) BP: (119-138)/(60-70) 138/70 (01/10 0753) SpO2:  [97 %-99 %] 97 % (01/10 0753) Last BM Date: 07/04/21  Intake/Output from previous day: 01/09 0701 - 01/10 0700 In: 482.5 [I.V.:482.5] Out: 925 [Urine:925] Intake/Output this shift: Total I/O In: 6 [I.V.:6] Out: 200 [Urine:200]  PE: Gen:  Alert, NAD, pleasant Pulm: rate and effort normal Abd: Soft, ND, nontender, midline incision cdi with staples present and no erythema or drainage  Lab Results:  Recent Labs    07/07/21 0113 07/07/21 0812  WBC 4.8 4.5  HGB 7.9* 8.3*  HCT 23.9* 25.0*  PLT 140* 162   BMET Recent Labs    07/06/21 1216 07/07/21 0113  NA 134* 134*  K 4.0 4.1  CL 106 107  CO2 24 21*  GLUCOSE 128* 96  BUN 20 24*  CREATININE 1.42* 1.24  CALCIUM 7.8* 7.7*   PT/INR Recent Labs    07/06/21 0156 07/07/21 0113  LABPROT 16.8* 15.9*  INR 1.4* 1.3*   CMP     Component Value Date/Time   NA 134 (L) 07/07/2021 0113   NA 134 (A) 10/23/2020 1035   NA 130 10/20/2017 0000   K 4.1 07/07/2021 0113   K 4.2 10/20/2017 0000   CL 107 07/07/2021 0113   CO2 21 (L) 07/07/2021 0113   GLUCOSE 96 07/07/2021 0113   BUN 24 (H) 07/07/2021 0113   BUN 33 (A) 10/23/2020 1035   CREATININE 1.24 07/07/2021 0113   CREATININE 1.38 10/20/2017 0000   CALCIUM 7.7 (L) 07/07/2021 0113   CALCIUM 8.6 10/20/2017 0000   PROT 4.8 (L) 07/07/2021 0113   ALBUMIN 2.0 (L) 07/07/2021 0113   AST 19 07/07/2021 0113   ALT 14  07/07/2021 0113   ALKPHOS 44 07/07/2021 0113   BILITOT 0.7 07/07/2021 0113   GFRNONAA 54 (L) 07/07/2021 0113   GFRAA 61 10/23/2020 1035   Lipase     Component Value Date/Time   LIPASE 34 06/24/2021 1220       Studies/Results: US RENAL  Result Date: 07/06/2021 CLINICAL DATA:  Acute kidney injury EXAM: RENAL / URINARY TRACT ULTRASOUND COMPLETE COMPARISON:  CT 06/24/2021 FINDINGS: Right Kidney: Renal measurements: 10.5 x 4.2 x 4.8 cm = volume: 110.2 mL. Cortex appears slightly echogenic. No hydronephrosis. Small cysts measuring up to 11 mm at the midpole. Left Kidney: Renal measurements: 9.9 x 4.8 x 4.1 cm = volume: 94.7 mL. Cortex appears slightly echogenic. No mass or hydronephrosis. Bladder: Slightly thick-walled urinary bladder Other: None. IMPRESSION: 1. Kidneys are slightly echogenic consistent with medical renal disease. 2. Negative for hydronephrosis 3. Slightly thick-walled urinary bladder Electronically Signed   By: Donavan Foil M.D.   On: 07/06/2021 16:42    Anti-infectives: Anti-infectives (From admission, onward)    None        Assessment/Plan POD #7, s/p ex lap with segmental TC resection for adenocarcinoma, Dr. Kae Heller 1/3 - surgical path T2n0 adenocarcinoma - Continue soft diet - PT/OT - rec SNF Acute Blood Loss  Anemia after surgery - likely oozing from anastomosis. Received 2 units PRBCs 07/04/21, heparin gtt restarted 1/9 and Hgb 8.3<<7.9<<8.3. maroon smear of stool this AM. Holding coumadin. Watch for further hematochezia and follow h/h   FEN - soft diet VTE - on heparin gtt and holding coumadin ID - none currently Foley - out 1/5 and voiding   A fib MVR on coumadin CKD   LOS: 13 days    Wellington Hampshire, [facility] 07/07/2021, 9:26 AM Please see Amion for pager number during day hours 7:00am-4:30pm

## 2021-07-07 NOTE — Progress Notes (Signed)
PROGRESS NOTE                                                                                                                                                                                                             Patient Demographics:    Richard Spears, is a 88 y.o. male, DOB - 05-22-1926, AUQ:[id number]  Outpatient Primary MD for the patient is Richard Orn, MD    LOS - 18  Admit date - 06/24/2021    Chief Complaint  Patient presents with   GI Problem       Brief Narrative (HPI from H&P)   - 89/M from independent living with spouse with history of paroxysmal atrial fibrillation and mechanical mitral valve replacement with St. Jude's metallic valve on Coumadin, chronic anemia, hypertension, chronic diastolic CHF, presented to the ED with 2 episodes of hematochezia, further work-up showed large colon mass underwent colon resection.  Currently on heparin drip for MVR.   Subjective:   Patient in bed, appears comfortable, denies any headache, no fever, no chest pain or pressure, no shortness of breath , no abdominal pain. No new focal weakness.   Assessment  & Plan :     Acute lower GI bleed with colonoscopy done on 06/27/2021 showing large transverse colon mass with acute blood loss related anemia lower GI bleed- he received 1 unit of packed RBC transfusion earlier this admission, seen by GI and general surgery, he underwent colonoscopy and which was positive for colon mass biopsy suggestive of poorly differentiated adenocarcinoma, he was subsequently seen by general surgery and underwent surgical resection of the mass on 06/30/2021 by general surgery.    He was subsequently started on heparin infusion and he tolerated it well for a few days however on 07/03/2021 he started having bright red blood per rectum requiring 2 units of packed RBC on 07/04/2020, anticoagulation has been held for now 48 hours H&H seems to have  stabilized.  Has been started without bolus with caution on 07/06/2021 with stable H&H so far.  Continue to monitor closely no signs of current ongoing bleeding, still not starting Coumadin.   2.  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  Present on admission.  S/p colon resection see #1 above, bowel activity has not returned fully, requested to advance activity, will monitor closely on liquid diet per surgery.  3.  CKD 3B.  Baseline creatinine around 1.5.  Monitor closely.  4.  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  Resolved for now.  5.  MVR.  Coagulation on hold due to #1 above, will hold for a full 24 - 48 hours, discussed risks and benefits with patient and son, also discussed with cardiologist on-call Dr Stanford Breed on 07/04/2021.  Kindly see #1 above  6.  Paroxysmal A. fib [place] vas 2 score of 3.  See #1 above for anticoagulation.  Will place on low-dose Lopressor and monitor.        Condition - Fair  Family Communication  : [name] [phone]  on 07/01/2021, 07/04/2021, message left on cell phone on 07/07/2021 at 10:54 AM  Code Status :  DNR  Consults  : General surgery, GI, Oncology,  PUD Prophylaxis :     Procedures  :     Colonoscopy done on 06/27/2021 showing large transverse colon mass.  Colon resection by general surgery on 06/30/2021      Disposition Plan  :    Status is: Inpatient  Remains inpatient appropriate because: Heparin drip, MVR, colon cancer   DVT Prophylaxis  : Heparin drip  Place and maintain sequential compression device Start: 07/04/21 1358 SCDs Start: 06/24/21 2115     Lab Results  Component Value Date   PLT 162 07/07/2021    Diet :  Diet Order             DIET SOFT Room service appropriate? Yes; Fluid consistency: Thin  Diet effective now                    Inpatient Medications  Scheduled Meds:  acetaminophen  1,000 mg Oral Q6H   Or   acetaminophen  650 mg Rectal Q6H   Chlorhexidine  Gluconate Cloth  6 each Topical Daily   docusate sodium  100 mg Oral BID   feeding supplement  1 Container Oral TID BM   ferrous sulfate  325 mg Oral TID WC   metoprolol tartrate  25 mg Oral BID   pantoprazole  40 mg Intravenous Q12H   sodium chloride flush  3 mL Intravenous Q12H   Continuous Infusions:  heparin 1,250 Units/hr (07/07/21 0920)   PRN Meds:.fentaNYL (SUBLIMAZE) injection, LORazepam, ondansetron (ZOFRAN) IV, oxyCODONE  Antibiotics  :    Anti-infectives (From admission, onward)    None        Time Spent in minutes  30   Lala Lund M.D on 07/07/2021 at 10:52 AM  To page go to [email or web address]   [facility] -  Office  [phone]  See all Orders from today for further details    Objective:   Vitals:   07/06/21 2000 07/07/21 0000 07/07/21 0400 07/07/21 0753  BP: 133/67 132/60 136/67 138/70  Pulse: 77 74 92 97  Resp: 18 16 16 19   Temp: 98 F (36.7 C) 97.9 F (36.6 C) 98.2 F (36.8 C) 98.1 F (36.7 C)  TempSrc: Oral Oral Oral Oral  SpO2: 97% 99% 98% 97%  Weight:      Height:        Wt Readings from Last 3 Encounters:  06/27/21 68 kg  04/28/21 68.1 kg  03/06/21 72 kg     Intake/Output Summary (Last 24 hours) at 07/07/2021 1052 Last data filed at 07/07/2021 0917 Gross per 24 hour  Intake 488.46 ml  Output 975 ml  Net -486.54 ml     Physical Exam  Awake Alert, No new F.N deficits, Normal affect [place].AT,PERRAL Supple Neck, No JVD,   Symmetrical Chest wall movement, Good air movement bilaterally, CTAB RRR,No Gallops, Rubs or new Murmurs,  +ve bowel sounds, midline infraumbilical abdominal scar stable, large soft chronic left inguinal hernia.    No Cyanosis, Clubbing or edema     Data Review:    CBC Recent Labs  Lab 07/05/21 1240 07/06/21 0156 07/06/21 0644 07/06/21 1153 07/06/21 1928 07/07/21 0113 07/07/21 0812  WBC 3.8*   < > 3.7* 3.7* 4.7 4.8 4.5  HGB 8.8*   < > 9.0* 8.1* 8.3* 7.9* 8.3*  HCT 27.1*   < > 27.5* 25.3*  25.9* 23.9* 25.0*  PLT 125*   < > 145* 156 152 140* 162  MCV 93.8   < > 93.9 93.4 94.2 94.1 94.0  MCH 30.4   < > 30.7 29.9 30.2 31.1 31.2  MCHC 32.5   < > 32.7 32.0 32.0 33.1 33.2  RDW 20.3*   < > 20.1* 19.9* 19.9* 19.6* 19.5*  LYMPHSABS 0.6*  --   --  0.4* 0.7 0.7 0.6*  MONOABS 0.3  --   --  0.3 0.4 0.5 0.4  EOSABS 0.2  --   --  0.2 0.2 0.2 0.3  BASOSABS 0.0  --   --  0.0 0.0 0.0 0.0   < > = values in this interval not displayed.    Electrolytes Recent Labs  Lab 07/02/21 0140 07/03/21 0144 07/04/21 0109 07/04/21 0110 07/05/21 0053 07/06/21 0156 07/06/21 1216 07/07/21 0113  NA 130* 130* 134*  --  138  --  134* 134*  K 4.5 4.5 4.1  --  4.2  --  4.0 4.1  CL 103 102 108  --  108  --  106 107  CO2 22 23 23   --  23  --  24 21*  GLUCOSE 102* 87 133*  --  96  --  128* 96  BUN 24* 23 27*  --  21  --  20 24*  CREATININE 1.26* 1.18 1.20  --  1.16  --  1.42* 1.24  CALCIUM 7.9* 7.8* 8.1*  --  8.1*  --  7.8* 7.7*  AST 22 20 17   --  17  --  20 19  ALT 8 11 11   --  11  --  13 14  ALKPHOS 45 43 43  --  37*  --  42 44  BILITOT 0.8 0.5 0.4  --  0.6  --  0.5 0.7  ALBUMIN 2.2* 2.0* 1.9*  --  2.1*  --  2.1* 2.0*  MG 1.9 1.8 2.0  --  1.8  --  1.9 1.9  INR  --   --  1.3*  --  1.3* 1.4*  --  1.3*  BNP 270.0* 582.9*  --  526.5* 613.6*  --   --  530.9*    ------------------------------------------------------------------------------------------------------------------ No results for input(s): CHOL, HDL, LDLCALC, TRIG, CHOLHDL, LDLDIRECT in the last 72 hours.  No results found for: HGBA1C  No results for input(s): TSH, T4TOTAL, T3FREE, THYROIDAB in the last 72 hours.  Invalid input(s): FREET3 ------------------------------------------------------------------------------------------------------------------ ID Labs Recent Labs  Lab 07/03/21 0144 07/03/21 2011 07/04/21 0109 07/04/21 1246 07/05/21 0053 07/05/21 1240 07/06/21 0644 07/06/21 1153 07/06/21 1216 07/06/21 1928  07/07/21 0113 07/07/21 0812  WBC 5.0   < > 4.1   < > 3.7*   < > 3.7* 3.7*  --  4.7 4.8 4.5  PLT 133*   < >  145*   < > 150   < > 145* 156  --  152 140* 162  CREATININE 1.18  --  1.20  --  1.16  --   --   --  1.42*  --  1.24  --    < > = values in this interval not displayed.   Cardiac Enzymes No results for input(s): CKMB, TROPONINI, MYOGLOBIN in the last 168 hours.  Invalid input(s): CK   Radiology Reports US RENAL  Result Date: 07/06/2021 CLINICAL DATA:  Acute kidney injury EXAM: RENAL / URINARY TRACT ULTRASOUND COMPLETE COMPARISON:  CT 06/24/2021 FINDINGS: Right Kidney: Renal measurements: 10.5 x 4.2 x 4.8 cm = volume: 110.2 mL. Cortex appears slightly echogenic. No hydronephrosis. Small cysts measuring up to 11 mm at the midpole. Left Kidney: Renal measurements: 9.9 x 4.8 x 4.1 cm = volume: 94.7 mL. Cortex appears slightly echogenic. No mass or hydronephrosis. Bladder: Slightly thick-walled urinary bladder Other: None. IMPRESSION: 1. Kidneys are slightly echogenic consistent with medical renal disease. 2. Negative for hydronephrosis 3. Slightly thick-walled urinary bladder Electronically Signed   By: Donavan Foil M.D.   On: 07/06/2021 16:42

## 2021-07-07 NOTE — Progress Notes (Signed)
ANTICOAGULATION CONSULT NOTE  Pharmacy Consult for Heparin Indication:  mechanical mitral valve and atrial fibrillation  Allergies  Allergen Reactions   Flexeril [Cyclobenzaprine] Diarrhea    Patient Measurements: Height: 6' (182.9 cm) Weight: 68 kg (149 lb 14.6 oz) IBW/kg (Calculated) : 77.6  Heparin Dosing Weight: 68 kg  Vital Signs: Temp: 98.1 F (36.7 C) (01/10 0753) Temp Source: Oral (01/10 0753) BP: 138/70 (01/10 0753) Pulse Rate: 97 (01/10 0753)  Labs: Recent Labs    07/05/21 0053 07/05/21 1240 07/06/21 0156 07/06/21 0644 07/06/21 1216 07/06/21 1928 07/07/21 0113 07/07/21 0812  HGB 8.5*   < > 8.0*   < >  --  8.3* 7.9* 8.3*  HCT 25.2*   < > 25.1*   < >  --  25.9* 23.9* 25.0*  PLT 150   < > 137*   < >  --  152 140* 162  LABPROT 16.5*  --  16.8*  --   --   --  15.9*  --   INR 1.3*  --  1.4*  --   --   --  1.3*  --   HEPARINUNFRC <0.10*  --   --   --   --  0.22*  --  0.66  CREATININE 1.16  --   --   --  1.42*  --  1.24  --    < > = values in this interval not displayed.     Estimated Creatinine Clearance: 35.8 mL/min (by C-G formula based on SCr of 1.24 mg/dL).   Assessment: 88 yo M admitted with GIB on 12/28. PTA warfarin (LD 12/28 @ 0730) for mechanical mitral valve and atrial fibrillation. INR 3.9 on admission > IV vitamin k 5mg  12/29 > INR 1.2 on recheck. Pharmacy consulted to start IV heparin while warfarin is on hold.  [facility] 12/14 regimen: warfarin 5mg  daily except 2.5mg  M/F, INR goal 2.5-3.5    Heparin level 0.66 this AM, HgB 8.3  Goal of Therapy:  Heparin level 0.3-0.5 units/ml Monitor platelets by anticoagulation protocol: Yes   Plan:  Decrease heparin to 1250 units / hr Daily heparin level, CBC    Thank you Anette Guarneri, PharmD Clinical Pharmacist

## 2021-07-07 NOTE — Progress Notes (Signed)
Subjective:  No cardiac complaints   Objective:  Vitals:   07/06/21 1624 07/06/21 2000 07/07/21 0000 07/07/21 0400  BP: 119/62 133/67 132/60 136/67  Pulse: 91 77 74 92  Resp: 19 18 16 16   Temp: 97.7 F (36.5 C) 98 F (36.7 C) 97.9 F (36.6 C) 98.2 F (36.8 C)  TempSrc: Oral Oral Oral Oral  SpO2: 98% 97% 99% 98%  Weight:      Height:        Intake/Output from previous day:  Intake/Output Summary (Last 24 hours) at 07/07/2021 0700 Last data filed at 07/07/2021 0420 Gross per 24 hour  Intake 482.46 ml  Output 925 ml  Net -442.54 ml    Physical Exam: Thin white male S1 click no MR murmur Abdomen soft BS positive post colectomy  No edema Palpable pedal pulses   Lab Results: Basic Metabolic Panel: Recent Labs    07/05/21 0053 07/06/21 0156 07/06/21 1216 07/07/21 0113  NA 138  --  134* 134*  K 4.2  --  4.0 4.1  CL 108  --  106 107  CO2 23  --  24 21*  GLUCOSE 96  --  128* 96  BUN 21  --  20 24*  CREATININE 1.16  --  1.42* 1.24  CALCIUM 8.1*  --  7.8* 7.7*  MG 1.8  --  1.9 1.9  PHOS 3.3 3.0  --   --    Liver Function Tests: Recent Labs    07/06/21 1216 07/07/21 0113  AST 20 19  ALT 13 14  ALKPHOS 42 44  BILITOT 0.5 0.7  PROT 5.1* 4.8*  ALBUMIN 2.1* 2.0*   No results for input(s): LIPASE, AMYLASE in the last 72 hours. CBC: Recent Labs    07/06/21 1928 07/07/21 0113  WBC 4.7 4.8  NEUTROABS 3.1 3.2  HGB 8.3* 7.9*  HCT 25.9* 23.9*  MCV 94.2 94.1  PLT 152 140*    Imaging: US RENAL  Result Date: 07/06/2021 CLINICAL DATA:  Acute kidney injury EXAM: RENAL / URINARY TRACT ULTRASOUND COMPLETE COMPARISON:  CT 06/24/2021 FINDINGS: Right Kidney: Renal measurements: 10.5 x 4.2 x 4.8 cm = volume: 110.2 mL. Cortex appears slightly echogenic. No hydronephrosis. Small cysts measuring up to 11 mm at the midpole. Left Kidney: Renal measurements: 9.9 x 4.8 x 4.1 cm = volume: 94.7 mL. Cortex appears slightly echogenic. No mass or hydronephrosis. Bladder:  Slightly thick-walled urinary bladder Other: None. IMPRESSION: 1. Kidneys are slightly echogenic consistent with medical renal disease. 2. Negative for hydronephrosis 3. Slightly thick-walled urinary bladder Electronically Signed   By: Donavan Foil M.D.   On: 07/06/2021 16:42    Cardiac Studies:  ECG: afib chronic    Telemetry:  afib   Echo: pending   Medications:    acetaminophen  1,000 mg Oral Q6H   Or   acetaminophen  650 mg Rectal Q6H   Chlorhexidine Gluconate Cloth  6 each Topical Daily   docusate sodium  100 mg Oral BID   feeding supplement  1 Container Oral TID BM   ferrous sulfate  325 mg Oral TID WC   metoprolol tartrate  25 mg Oral BID   pantoprazole  40 mg Intravenous Q12H   sodium chloride flush  3 mL Intravenous Q12H      heparin 1,300 Units/hr (07/06/21 2241)   pantoprazole 8 mg/hr (07/07/21 0050)    Assessment/Plan:   Afib:  chronic rate control fine coumadin held for surgery and heparin held for post  op rectal bleeding Resumed 1345 yesterday would give coumadin dose today if stable  MVR:  88 yo St Jude mechanical valve normal valve clicks on exam Normal function by TTE  GI bleed:  adenocarcinoma color post surgery Has received multiple transfusions Hct 23.9 this am   Jenkins Rouge 07/07/2021, 7:00 AM

## 2021-07-07 NOTE — Progress Notes (Signed)
Physical Therapy Treatment Patient Details Name: Richard Spears MRN: [id number] DOB: 1925-11-13 Today's Date: 07/07/2021   History of Present Illness 88 y.o. male presentign to ED 12/28 with melana. Patient admitted with GI bleed and anemia. CT (+) lower GI bleed vs mass. CT also found large L inguinal hernia containing nonobstructed loop of large bowel. Colonoscopy 12/31 confirmed near obstructing mass in transverse colon. Biopsies obtained; path pending. S/p partial transverse colectomy 1/3. PMH includes: A-fib, BPH, OA, HTN, BLE venous ulcers, renal insufficiency, mechanical mitral valve replacement, Hx of fall 08/2020 s/p L femur ORIF and R/L hip arthroplasties.    PT Comments    Pt received in bed, agreeable to participation in therapy. Upon sitting EOB, pt with c/o dizziness. BP 132/47. Dizziness persisted but pt agreeable to ambulate short distance to recliner. Once reaching recliner, pt stated the dizziness was better, but he felt very fatigued. Pt performed LE exercises. Feet elevated and pt remained in recliner at end of session. He required min assist bed mobility, transfers, and amb 20' with RW.     Recommendations for follow up therapy are one component of a multi-disciplinary discharge planning process, led by the attending physician.  Recommendations may be updated based on patient status, additional functional criteria and insurance authorization.  Follow Up Recommendations  Skilled nursing-short term rehab (<3 hours/day)     Assistance Recommended at Discharge Intermittent Supervision/Assistance  Patient can return home with the following A little help with walking and/or transfers;A lot of help with bathing/dressing/bathroom;Assist for transportation   Equipment Recommendations  None recommended by PT    Recommendations for Other Services       Precautions / Restrictions Precautions Precautions: Fall;Other (comment) Precaution Comments: Hx of falls; monitor HR;  abdominal incision w/ precautions; BLE unna boots     Mobility  Bed Mobility Overal bed mobility: Needs Assistance Bed Mobility: Supine to Sit     Supine to sit: Min assist;HOB elevated     General bed mobility comments: +rail, increased time    Transfers Overall transfer level: Needs assistance Equipment used: Rolling walker (2 wheels) Transfers: Sit to/from Stand Sit to Stand: Min assist           General transfer comment: cues for hand placement. Assist to power up.    Ambulation/Gait Ambulation/Gait assistance: Min assist Gait Distance (Feet): 20 Feet Assistive device: Rolling walker (2 wheels) Gait Pattern/deviations: Step-through pattern;Decreased stride length Gait velocity: decreased Gait velocity interpretation: <1.8 ft/sec, indicate of risk for recurrent falls   General Gait Details: distance limited by dizziness and fatigue   Stairs             Wheelchair Mobility    Modified Rankin (Stroke Patients Only)       Balance Overall balance assessment: Needs assistance Sitting-balance support: No upper extremity supported;Feet supported Sitting balance-Leahy Scale: Fair     Standing balance support: Bilateral upper extremity supported;During functional activity;Reliant on assistive device for balance Standing balance-Leahy Scale: Poor                              Cognition Arousal/Alertness: Awake/alert Behavior During Therapy: WFL for tasks assessed/performed Overall Cognitive Status: Within Functional Limits for tasks assessed                                          Exercises  General Exercises - Lower Extremity Ankle Circles/Pumps: AROM;Both;10 reps;Seated Long Arc Quad: AROM;Right;Left;10 reps;Seated Hip Flexion/Marching: AROM;Right;Left;10 reps;Seated    General Comments General comments (skin integrity, edema, etc.): Pt with c/o dizziness upon sitting EOB. BP 132/47      Pertinent Vitals/Pain  Pain Assessment: Faces Faces Pain Scale: Hurts little more Pain Location: abdomen Pain Descriptors / Indicators: Discomfort;Grimacing Pain Intervention(s): Repositioned;Limited activity within patient's tolerance;Monitored during session    Home Living                          Prior Function            PT Goals (current goals can now be found in the care plan section) Acute Rehab PT Goals Patient Stated Goal: [facility] at [facility] to work on [employer] Progress towards PT goals: Progressing toward goals    Frequency    Min 2X/week      PT Plan Current plan remains appropriate    Co-evaluation              AM-PAC PT "6 Clicks" Mobility   Outcome Measure  Help needed turning from your back to your side while in a flat bed without using bedrails?: A Little Help needed moving from lying on your back to sitting on the side of a flat bed without using bedrails?: A Little Help needed moving to and from a bed to a chair (including a wheelchair)?: A Little Help needed standing up from a chair using your arms (e.g., wheelchair or bedside chair)?: A Little Help needed to walk in hospital room?: A Little Help needed climbing 3-5 steps with a railing? : A Lot 6 Click Score: 17    End of Session Equipment Utilized During Treatment: Gait belt Activity Tolerance: Treatment limited secondary to medical complications (Comment) (dizziness) Patient left: in chair;with chair alarm set;with call bell/phone within reach Nurse Communication: Mobility status PT Visit Diagnosis: Other abnormalities of gait and mobility (R26.89);Muscle weakness (generalized) (M62.81);Difficulty in walking, not elsewhere classified (R26.2)     Time: 7494-4967 PT Time Calculation (min) (ACUTE ONLY): 27 min  Charges:  $Gait Training: 8-22 mins $Therapeutic Exercise: 8-22 mins                     Lorrin Goodell, PT  Office # [phone] Pager [phone]    Lorriane Shire 07/07/2021, 11:24 AM

## 2021-07-08 DIAGNOSIS — Z952 Presence of prosthetic heart valve: Secondary | ICD-10-CM | POA: Diagnosis not present

## 2021-07-08 DIAGNOSIS — I10 Essential (primary) hypertension: Secondary | ICD-10-CM | POA: Diagnosis not present

## 2021-07-08 DIAGNOSIS — I4811 Longstanding persistent atrial fibrillation: Secondary | ICD-10-CM | POA: Diagnosis not present

## 2021-07-08 DIAGNOSIS — E871 Hypo-osmolality and hyponatremia: Secondary | ICD-10-CM | POA: Diagnosis not present

## 2021-07-08 DIAGNOSIS — K922 Gastrointestinal hemorrhage, unspecified: Secondary | ICD-10-CM | POA: Diagnosis not present

## 2021-07-08 DIAGNOSIS — D62 Acute posthemorrhagic anemia: Secondary | ICD-10-CM | POA: Diagnosis not present

## 2021-07-08 LAB — CBC
HCT: 22.2 % — ABNORMAL LOW (ref 39.0–52.0)
HCT: 27.7 % — ABNORMAL LOW (ref 39.0–52.0)
Hemoglobin: 7.1 g/dL — ABNORMAL LOW (ref 13.0–17.0)
Hemoglobin: 9.3 g/dL — ABNORMAL LOW (ref 13.0–17.0)
MCH: 30.3 pg (ref 26.0–34.0)
MCH: 30.4 pg (ref 26.0–34.0)
MCHC: 32 g/dL (ref 30.0–36.0)
MCHC: 33.6 g/dL (ref 30.0–36.0)
MCV: 94.9 fL (ref 80.0–100.0)
MCV: 90.5 fL (ref 80.0–100.0)
Platelets: ADEQUATE 10*3/uL (ref 150–400)
Platelets: 184 10*3/uL (ref 150–400)
RBC: 2.34 MIL/uL — ABNORMAL LOW (ref 4.22–5.81)
RBC: 3.06 MIL/uL — ABNORMAL LOW (ref 4.22–5.81)
RDW: 19.6 % — ABNORMAL HIGH (ref 11.5–15.5)
RDW: 18.7 % — ABNORMAL HIGH (ref 11.5–15.5)
WBC: 4 10*3/uL (ref 4.0–10.5)
WBC: 4.9 10*3/uL (ref 4.0–10.5)
nRBC: 0 % (ref 0.0–0.2)
nRBC: 0.4 % — ABNORMAL HIGH (ref 0.0–0.2)

## 2021-07-08 LAB — PROTIME-INR
INR: 1.3 — ABNORMAL HIGH (ref 0.8–1.2)
Prothrombin Time: 15.9 seconds — ABNORMAL HIGH (ref 11.4–15.2)

## 2021-07-08 LAB — COMPREHENSIVE METABOLIC PANEL
ALT: 14 U/L (ref 0–44)
AST: 22 U/L (ref 15–41)
Albumin: 1.9 g/dL — ABNORMAL LOW (ref 3.5–5.0)
Alkaline Phosphatase: 44 U/L (ref 38–126)
Anion gap: 6 (ref 5–15)
BUN: 31 mg/dL — ABNORMAL HIGH (ref 8–23)
CO2: 21 mmol/L — ABNORMAL LOW (ref 22–32)
Calcium: 7.8 mg/dL — ABNORMAL LOW (ref 8.9–10.3)
Chloride: 105 mmol/L (ref 98–111)
Creatinine, Ser: 1.12 mg/dL (ref 0.61–1.24)
GFR, Estimated: >60 mL/min (ref >60)
Glucose, Bld: 94 mg/dL (ref 70–99)
Potassium: 4.7 mmol/L (ref 3.5–5.1)
Sodium: 132 mmol/L — ABNORMAL LOW (ref 135–145)
Total Bilirubin: 0.5 mg/dL (ref 0.3–1.2)
Total Protein: 4.7 g/dL — ABNORMAL LOW (ref 6.5–8.1)

## 2021-07-08 LAB — CBC WITH DIFFERENTIAL/PLATELET
Abs Immature Granulocytes: 0.15 10*3/uL — ABNORMAL HIGH (ref 0.00–0.07)
Basophils Absolute: 0 10*3/uL (ref 0.0–0.1)
Basophils Relative: 1 %
Eosinophils Absolute: 0.3 10*3/uL (ref 0.0–0.5)
Eosinophils Relative: 6 %
HCT: 22.1 % — ABNORMAL LOW (ref 39.0–52.0)
Hemoglobin: 7 g/dL — ABNORMAL LOW (ref 13.0–17.0)
Immature Granulocytes: 3 %
Lymphocytes Relative: 17 %
Lymphs Abs: 0.7 10*3/uL (ref 0.7–4.0)
MCH: 30.3 pg (ref 26.0–34.0)
MCHC: 31.7 g/dL (ref 30.0–36.0)
MCV: 95.7 fL (ref 80.0–100.0)
Monocytes Absolute: 0.4 10*3/uL (ref 0.1–1.0)
Monocytes Relative: 10 %
Neutro Abs: 2.8 10*3/uL (ref 1.7–7.7)
Neutrophils Relative %: 63 %
Platelets: 136 10*3/uL — ABNORMAL LOW (ref 150–400)
RBC: 2.31 MIL/uL — ABNORMAL LOW (ref 4.22–5.81)
RDW: 19.6 % — ABNORMAL HIGH (ref 11.5–15.5)
WBC: 4.4 10*3/uL (ref 4.0–10.5)
nRBC: 0.5 % — ABNORMAL HIGH (ref 0.0–0.2)

## 2021-07-08 LAB — PREPARE RBC (CROSSMATCH)

## 2021-07-08 LAB — HEPARIN LEVEL (UNFRACTIONATED): Heparin Unfractionated: 0.28 IU/mL — ABNORMAL LOW (ref 0.30–0.70)

## 2021-07-08 LAB — BRAIN NATRIURETIC PEPTIDE: B Natriuretic Peptide: 437.7 pg/mL — ABNORMAL HIGH (ref 0.0–100.0)

## 2021-07-08 LAB — MAGNESIUM: Magnesium: 1.8 mg/dL (ref 1.7–2.4)

## 2021-07-08 MED ORDER — SODIUM CHLORIDE 0.9% IV SOLUTION: Freq: Once | INTRAVENOUS | Status: AC

## 2021-07-08 MED ORDER — FUROSEMIDE 10 MG/ML IJ SOLN
20.0000 mg | Freq: Once | INTRAMUSCULAR | Status: AC
Start: 2021-07-08 — End: 2021-07-08
  Administered 2021-07-08: 20 mg via INTRAVENOUS
  Filled 2021-07-08: qty 2

## 2021-07-08 NOTE — Progress Notes (Signed)
ANTICOAGULATION CONSULT NOTE  Pharmacy Consult for Heparin Indication:  mechanical mitral valve and atrial fibrillation  Allergies  Allergen Reactions   Flexeril [Cyclobenzaprine] Diarrhea    Patient Measurements: Height: 6' (182.9 cm) Weight: 68 kg (149 lb 14.6 oz) IBW/kg (Calculated) : 77.6  Heparin Dosing Weight: 68 kg  Vital Signs: Temp: 97.7 F (36.5 C) (01/11 0753) Temp Source: Oral (01/11 0753) BP: 136/70 (01/11 0753) Pulse Rate: 98 (01/11 0753)  Labs: Recent Labs    07/06/21 0156 07/06/21 0644 07/06/21 1216 07/06/21 1928 07/07/21 0113 07/07/21 0812 07/07/21 1440 07/08/21 0110  HGB 8.0*   < >  --  8.3* 7.9* 8.3* 8.3* 7.0*  HCT 25.1*   < >  --  25.9* 23.9* 25.0* 25.4* 22.1*  PLT 137*   < >  --  152 140* 162 179 136*  LABPROT 16.8*  --   --   --  15.9*  --   --  15.9*  INR 1.4*  --   --   --  1.3*  --   --  1.3*  HEPARINUNFRC  --   --   --  0.22*  --  0.66  --  0.28*  CREATININE  --   --  1.42*  --  1.24  --   --  1.12   < > = values in this interval not displayed.     Estimated Creatinine Clearance: 39.6 mL/min (by C-G formula based on SCr of 1.12 mg/dL).   Assessment: 88 yo M admitted with GIB on 12/28. PTA warfarin (LD 12/28 @ 0730) for mechanical mitral valve and atrial fibrillation. INR 3.9 on admission > IV vitamin k 5mg  12/29 > INR 1.2 on recheck. Pharmacy consulted to start IV heparin while warfarin is on hold.  [facility] 12/14 regimen: warfarin 5mg  daily except 2.5mg  M/F, INR goal 2.5-3.5    Heparin level 0.28 HgB down to 7, receiving blood  Goal of Therapy:  Heparin level 0.3-0.5 units/ml Monitor platelets by anticoagulation protocol: Yes   Plan:  Continue heparin at current rate 1250 units/hr  Daily heparin level, CBC    Thank you Anette Guarneri, PharmD Clinical Pharmacist

## 2021-07-08 NOTE — Progress Notes (Signed)
Subjective:  No cardiac complaints Rectal tube and black stools Hct down again   Objective:  Vitals:   07/07/21 2051 07/07/21 2329 07/08/21 0411 07/08/21 0753  BP: 126/70 137/90 (!) 124/47 136/70  Pulse: 100 98 86 98  Resp: 20 19 20 20   Temp: 98.9 F (37.2 C) 97.7 F (36.5 C) 97.6 F (36.4 C) 97.7 F (36.5 C)  TempSrc: Oral Oral Oral Oral  SpO2:    98%  Weight:      Height:        Intake/Output from previous day:  Intake/Output Summary (Last 24 hours) at 07/08/2021 0854 Last data filed at 07/08/2021 0413 Gross per 24 hour  Intake 609 ml  Output 500 ml  Net 109 ml    Physical Exam: Thin white male S1 click no MR murmur Abdomen soft BS positive post colectomy  No edema Palpable pedal pulses   Lab Results: Basic Metabolic Panel: Recent Labs    07/06/21 0156 07/06/21 1216 07/07/21 0113 07/08/21 0110  NA  --    < > 134* 132*  K  --    < > 4.1 4.7  CL  --    < > 107 105  CO2  --    < > 21* 21*  GLUCOSE  --    < > 96 94  BUN  --    < > 24* 31*  CREATININE  --    < > 1.24 1.12  CALCIUM  --    < > 7.7* 7.8*  MG  --    < > 1.9 1.8  PHOS 3.0  --   --   --    < > = values in this interval not displayed.   Liver Function Tests: Recent Labs    07/07/21 0113 07/08/21 0110  AST 19 22  ALT 14 14  ALKPHOS 44 44  BILITOT 0.7 0.5  PROT 4.8* 4.7*  ALBUMIN 2.0* 1.9*   No results for input(s): LIPASE, AMYLASE in the last 72 hours. CBC: Recent Labs    07/07/21 1440 07/08/21 0110 07/08/21 0617  WBC 5.4 4.4 4.0  NEUTROABS 4.0 2.8  --   HGB 8.3* 7.0* 7.1*  HCT 25.4* 22.1* 22.2*  MCV 93.7 95.7 94.9  PLT 179 136* PENDING    Imaging: US RENAL  Result Date: 07/06/2021 CLINICAL DATA:  Acute kidney injury EXAM: RENAL / URINARY TRACT ULTRASOUND COMPLETE COMPARISON:  CT 06/24/2021 FINDINGS: Right Kidney: Renal measurements: 10.5 x 4.2 x 4.8 cm = volume: 110.2 mL. Cortex appears slightly echogenic. No hydronephrosis. Small cysts measuring up to 11 mm at the  midpole. Left Kidney: Renal measurements: 9.9 x 4.8 x 4.1 cm = volume: 94.7 mL. Cortex appears slightly echogenic. No mass or hydronephrosis. Bladder: Slightly thick-walled urinary bladder Other: None. IMPRESSION: 1. Kidneys are slightly echogenic consistent with medical renal disease. 2. Negative for hydronephrosis 3. Slightly thick-walled urinary bladder Electronically Signed   By: Donavan Foil M.D.   On: 07/06/2021 16:42    Cardiac Studies:  ECG: afib chronic    Telemetry:  afib   Echo: pending   Medications:    acetaminophen  1,000 mg Oral Q6H   Or   acetaminophen  650 mg Rectal Q6H   Chlorhexidine Gluconate Cloth  6 each Topical Daily   docusate sodium  100 mg Oral BID   feeding supplement  1 Container Oral TID BM   ferrous sulfate  325 mg Oral TID WC   metoprolol tartrate  25  mg Oral BID   pantoprazole  40 mg Intravenous Q12H   sodium chloride flush  3 mL Intravenous Q12H        Assessment/Plan:   Afib:  chronic rate control fine coumadin held for surgery and heparin held for post op rectal bleeding Heparin seems to be running in room this am but  Hct 22.2 and may need to be d/c again with transfusion  MVR:  88 yo St Jude mechanical valve normal valve clicks on exam Normal function by TTE  GI bleed:  adenocarcinoma color post surgery Has received multiple transfusions Hct 22.2  this am Would ask surgery what plan is for identifying recurrent source of bleeding   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

## 2021-07-08 NOTE — Progress Notes (Signed)
OT Cancellation Note  Patient Details Name: Richard Spears MRN: [id number] DOB: September 04, 1925   Cancelled Treatment:    Reason Eval/Treat Not Completed: Medical issues which prohibited therapy (Patient recieving blood transfusion) Lodema Hong, [place]  Pager [phone] Office [place] 07/08/2021, 2:17 PM

## 2021-07-08 NOTE — Progress Notes (Signed)
Patient ID: Richard Spears, male   DOB: Feb 10, 1926, 88 y.o.   MRN: [id number] [facility] Progress Note  8 Days Post-Op  Subjective: CC-  More bright red blood per rectum. Hemoglobin dropped to 7. Patient getting 2 units PRBCs. Denies any worsening abdominal pain. Tolerating diet and passing flatus.  Objective: Vital signs in last 24 hours: Temp:  [97.3 F (36.3 C)-98.9 F (37.2 C)] 97.6 F (36.4 C) (01/11 1047) Pulse Rate:  [73-100] 80 (01/11 1047) Resp:  [16-20] 18 (01/11 1047) BP: (99-137)/(47-90) 100/49 (01/11 1047) SpO2:  [98 %-100 %] 98 % (01/11 1033) Last BM Date: 07/04/21  Intake/Output from previous day: 01/10 0701 - 01/11 0700 In: 609 [P.O.:600; I.V.:9] Out: 700 [Urine:700] Intake/Output this shift: Total I/O In: 315 [Blood:315] Out: -   PE: Gen:  Alert, NAD, pleasant Pulm: rate and effort normal Abd: Soft, ND, nontender, midline incision cdi with staples present and no erythema or drainage  Lab Results:  Recent Labs    07/08/21 0110 07/08/21 0617  WBC 4.4 4.0  HGB 7.0* 7.1*  HCT 22.1* 22.2*  PLT 136* PLATELET CLUMPS NOTED ON SMEAR, COUNT APPEARS ADEQUATE   BMET Recent Labs    07/07/21 0113 07/08/21 0110  NA 134* 132*  K 4.1 4.7  CL 107 105  CO2 21* 21*  GLUCOSE 96 94  BUN 24* 31*  CREATININE 1.24 1.12  CALCIUM 7.7* 7.8*   PT/INR Recent Labs    07/07/21 0113 07/08/21 0110  LABPROT 15.9* 15.9*  INR 1.3* 1.3*   CMP     Component Value Date/Time   NA 132 (L) 07/08/2021 0110   NA 134 (A) 10/23/2020 1035   NA 130 10/20/2017 0000   K 4.7 07/08/2021 0110   K 4.2 10/20/2017 0000   CL 105 07/08/2021 0110   CO2 21 (L) 07/08/2021 0110   GLUCOSE 94 07/08/2021 0110   BUN 31 (H) 07/08/2021 0110   BUN 33 (A) 10/23/2020 1035   CREATININE 1.12 07/08/2021 0110   CREATININE 1.38 10/20/2017 0000   CALCIUM 7.8 (L) 07/08/2021 0110   CALCIUM 8.6 10/20/2017 0000   PROT 4.7 (L) 07/08/2021 0110   ALBUMIN 1.9 (L) 07/08/2021 0110    AST 22 07/08/2021 0110   ALT 14 07/08/2021 0110   ALKPHOS 44 07/08/2021 0110   BILITOT 0.5 07/08/2021 0110   GFRNONAA >60 07/08/2021 0110   GFRAA 61 10/23/2020 1035   Lipase     Component Value Date/Time   LIPASE 34 06/24/2021 1220       Studies/Results: US RENAL  Result Date: 07/06/2021 CLINICAL DATA:  Acute kidney injury EXAM: RENAL / URINARY TRACT ULTRASOUND COMPLETE COMPARISON:  CT 06/24/2021 FINDINGS: Right Kidney: Renal measurements: 10.5 x 4.2 x 4.8 cm = volume: 110.2 mL. Cortex appears slightly echogenic. No hydronephrosis. Small cysts measuring up to 11 mm at the midpole. Left Kidney: Renal measurements: 9.9 x 4.8 x 4.1 cm = volume: 94.7 mL. Cortex appears slightly echogenic. No mass or hydronephrosis. Bladder: Slightly thick-walled urinary bladder Other: None. IMPRESSION: 1. Kidneys are slightly echogenic consistent with medical renal disease. 2. Negative for hydronephrosis 3. Slightly thick-walled urinary bladder Electronically Signed   By: Donavan Foil M.D.   On: 07/06/2021 16:42    Anti-infectives: Anti-infectives (From admission, onward)    None        Assessment/Plan POD #8, s/p ex lap with segmental TC resection for adenocarcinoma, Dr. Kae Heller 1/3 - surgical path T2n0 adenocarcinoma - Ok for regular diet - PT/OT -  rec SNF Acute Blood Loss Anemia after surgery - likely intermittent oozing from anastomosis. Abdominal exam is benign. Received 2 units PRBCs 07/04/21, heparin gtt restarted 1/9 and hgb dropped again today 7. Holding heparin and he's getting 2 units PRBCs. Watch for further hematochezia and follow h/h   FEN - reg diet VTE - hold heparin gtt and holding coumadin ID - none currently Foley - out 1/5 and voiding   A fib MVR on coumadin CKD   LOS: 14 days    Richard Spears, [facility] 07/08/2021, 11:03 AM Please see Amion for pager number during day hours 7:00am-4:30pm

## 2021-07-08 NOTE — Progress Notes (Signed)
PROGRESS NOTE                                                                                                                                                                                                             Patient Demographics:    Richard Spears, is a 88 y.o. 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  Currently on heparin drip for MVR.   Subjective:   With multiple bowel movements, dark in color (melena) overnight, requiring Flexi-Seal inserted, hemoglobin came back low at 7.     Assessment  & Plan :    Acute lower GI bleed with colonoscopy done on 06/27/2021 showing large transverse colon mass with acute blood loss related anemia  - lower GI bleed- he received 1 unit of packed RBC transfusion earlier this admission, seen by GI and general surgery, he underwent colonoscopy and which was positive for colon mass biopsy suggestive of poorly differentiated adenocarcinoma, he was subsequently seen by general surgery and underwent surgical resection of the mass on 06/30/2021 by general surgery.    He was subsequently started on heparin infusion and he tolerated it well for a few days however on 07/03/2021 he started having bright red blood per rectum requiring 2 units of packed RBC on 07/04/2020, anticoagulation has been held for now 48 hours H&H seems to have stabilized.  Has been started without  bolus with caution on 07/06/2021 with stable H&H so far.  Continue to monitor closely no signs of current ongoing bleeding, still not starting Coumadin. -Pain came back low at 7 this morning, confirmed with repeat hemoglobin of 7.1.  He is with evidence of melena, will hold his heparin GTT for another 24- 48 hours, pending medical stability and hemoglobin stabilization, will go ahead and give another 2 units of  RBC today.  Give 20 mg of IV Lasix in between transfusions.  General surgery input greatly appreciated, bleeding from the anastomosis exacerbated by the need of anticoagulation.  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  Present on admission.  S/p colon resection see #1 above, bowel activity has not returned fully, requested to advance activity, will monitor closely on liquid diet per surgery.  CKD 3B.  Baseline creatinine around 1.5.  Monitor closely.  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  Resolved for now.  MVR.   - Coagulation on hold due  above, now he is with evidence with recurrent bleed, I will go ahead and hold his heparin GTT, likely need to hold for another 1 to 2 days till his hemoglobin has stabilized and no further evidence of GI bleed.  -I have discussed with the patient and the son, there is risk involved either way, but at this point given his active bleed we will go ahead and hold his anticoagulation for next 24 to 48 hours.  Paroxysmal A. fib [place] vas 2 score of 3.  See above for anticoagulation.  Will place on low-dose Lopressor and monitor.        Condition - Fair  Family Communication  : Son Alferd Apa [phone] updated today  Code Status :  DNR  Consults  : General surgery, GI, Oncology,  PUD Prophylaxis :     Procedures  :     Colonoscopy done on 06/27/2021 showing large transverse colon mass.  Colon resection by general surgery on 06/30/2021      Disposition Plan  :    Status is: Inpatient  Remains inpatient  appropriate because: Heparin drip, MVR, colon cancer   DVT Prophylaxis  : Heparin drip  Place and maintain sequential compression device Start: 07/04/21 1358 SCDs Start: 06/24/21 2115     Lab Results  Component Value Date   PLT  07/08/2021    PLATELET CLUMPS NOTED ON SMEAR, COUNT APPEARS ADEQUATE    Diet :  Diet Order             Diet regular Room service appropriate? Yes; Fluid consistency: Thin  Diet effective now                    Inpatient Medications  Scheduled Meds:  acetaminophen  1,000 mg Oral Q6H   Or   acetaminophen  650 mg Rectal Q6H   Chlorhexidine Gluconate Cloth  6 each Topical Daily   docusate sodium  100 mg Oral BID   feeding supplement  1 Container Oral TID BM   ferrous sulfate  325 mg Oral TID WC   metoprolol tartrate  25 mg Oral BID   pantoprazole  40 mg Intravenous Q12H   sodium chloride flush  3 mL Intravenous Q12H   Continuous Infusions:   PRN Meds:.fentaNYL (SUBLIMAZE) injection, LORazepam, ondansetron (ZOFRAN) IV, oxyCODONE  Antibiotics  :    Anti-infectives (From admission, onward)    None         Phillips Climes M.D on 07/08/2021 at 12:11 PM  To page go to [email or web address]   [facility] -  Office  [phone]  See all Orders from today for further details    Objective:   Vitals:   07/08/21 0753 07/08/21 1033 07/08/21 1047 07/08/21 1115  BP: 136/70 (!) 99/49 (!) 100/49 (!) 114/100  Pulse: 98 89 80 92  Resp: 20 17 18    Temp: 97.7 F (36.5 C) (!) 97.3 F (36.3 C)  97.6 F (36.4 C) 97.7 F (36.5 C)  TempSrc: Oral Temporal  Temporal  SpO2: 98% 98%  98%  Weight:      Height:        Wt Readings from Last 3 Encounters:  06/27/21 68 kg  04/28/21 68.1 kg  03/06/21 72 kg     Intake/Output Summary (Last 24 hours) at 07/08/2021 1211 Last data filed at 07/08/2021 1115 Gross per 24 hour  Intake 857 ml  Output 500 ml  Net 357 ml     Physical Exam  Awake Alert, No new F.N deficits, Normal  affect [place].AT,PERRAL Supple Neck, No JVD,   Symmetrical Chest wall movement, Good air movement bilaterally, CTAB RRR,No Gallops, Rubs or new Murmurs,  +ve bowel sounds, midline infraumbilical abdominal scar stable, large soft chronic left inguinal hernia.    No Cyanosis, Clubbing or edema     Data Review:    CBC Recent Labs  Lab 07/06/21 1928 07/07/21 0113 07/07/21 0812 07/07/21 1440 07/08/21 0110 07/08/21 0617  WBC 4.7 4.8 4.5 5.4 4.4 4.0  HGB 8.3* 7.9* 8.3* 8.3* 7.0* 7.1*  HCT 25.9* 23.9* 25.0* 25.4* 22.1* 22.2*  PLT 152 140* 162 179 136* PLATELET CLUMPS NOTED ON SMEAR, COUNT APPEARS ADEQUATE  MCV 94.2 94.1 94.0 93.7 95.7 94.9  MCH 30.2 31.1 31.2 30.6 30.3 30.3  MCHC 32.0 33.1 33.2 32.7 31.7 32.0  RDW 19.9* 19.6* 19.5* 19.9* 19.6* 19.6*  LYMPHSABS 0.7 0.7 0.6* 0.6* 0.7  --   MONOABS 0.4 0.5 0.4 0.5 0.4  --   EOSABS 0.2 0.2 0.3 0.2 0.3  --   BASOSABS 0.0 0.0 0.0 0.0 0.0  --     Electrolytes Recent Labs  Lab 07/03/21 0144 07/04/21 0109 07/04/21 0110 07/05/21 0053 07/06/21 0156 07/06/21 1216 07/07/21 0113 07/08/21 0110  NA 130* 134*  --  138  --  134* 134* 132*  K 4.5 4.1  --  4.2  --  4.0 4.1 4.7  CL 102 108  --  108  --  106 107 105  CO2 23 23  --  23  --  24 21* 21*  GLUCOSE 87 133*  --  96  --  128* 96 94  BUN 23 27*  --  21  --  20 24* 31*  CREATININE 1.18 1.20  --  1.16  --  1.42* 1.24 1.12  CALCIUM 7.8* 8.1*  --  8.1*  --  7.8* 7.7* 7.8*  AST 20 17  --  17  --  20 19 22   ALT 11 11  --  11  --  13 14 14   ALKPHOS 43 43  --  37*  --  42 44 44  BILITOT 0.5 0.4  --  0.6  --  0.5 0.7 0.5  ALBUMIN 2.0* 1.9*  --  2.1*  --  2.1* 2.0* 1.9*  MG 1.8 2.0  --  1.8  --  1.9 1.9 1.8  INR  --  1.3*  --  1.3* 1.4*  --  1.3* 1.3*  BNP 582.9*  --  526.5* 613.6*  --   --  530.9* 437.7*    ------------------------------------------------------------------------------------------------------------------ No results for input(s): CHOL, HDL, LDLCALC, TRIG, CHOLHDL,  LDLDIRECT in the last 72 hours.  No results found for: HGBA1C  No results for input(s): TSH, T4TOTAL, T3FREE, THYROIDAB in the last 72 hours.  Invalid input(s): FREET3 ------------------------------------------------------------------------------------------------------------------ ID Labs Recent Labs  Lab 07/04/21 0109 07/04/21 1246 07/05/21 0053 07/05/21 1240 07/06/21 1216 07/06/21 1928  07/07/21 0113 07/07/21 0812 07/07/21 1440 07/08/21 0110 07/08/21 0617  WBC 4.1   < > 3.7*   < >  --    < > 4.8 4.5 5.4 4.4 4.0  PLT 145*   < > 150   < >  --    < > 140* 162 179 136* PLATELET CLUMPS NOTED ON SMEAR, COUNT APPEARS ADEQUATE  CREATININE 1.20  --  1.16  --  1.42*  --  1.24  --   --  1.12  --    < > = values in this interval not displayed.   Cardiac Enzymes No results for input(s): CKMB, TROPONINI, MYOGLOBIN in the last 168 hours.  Invalid input(s): CK   Radiology Reports US RENAL  Result Date: 07/06/2021 CLINICAL DATA:  Acute kidney injury EXAM: RENAL / URINARY TRACT ULTRASOUND COMPLETE COMPARISON:  CT 06/24/2021 FINDINGS: Right Kidney: Renal measurements: 10.5 x 4.2 x 4.8 cm = volume: 110.2 mL. Cortex appears slightly echogenic. No hydronephrosis. Small cysts measuring up to 11 mm at the midpole. Left Kidney: Renal measurements: 9.9 x 4.8 x 4.1 cm = volume: 94.7 mL. Cortex appears slightly echogenic. No mass or hydronephrosis. Bladder: Slightly thick-walled urinary bladder Other: None. IMPRESSION: 1. Kidneys are slightly echogenic consistent with medical renal disease. 2. Negative for hydronephrosis 3. Slightly thick-walled urinary bladder Electronically Signed   By: Donavan Foil M.D.   On: 07/06/2021 16:42     Patient ID: [name], male   DOB: 30-Aug-1925, 88 y.o.   MRN: [id number]

## 2021-07-09 DIAGNOSIS — Z952 Presence of prosthetic heart valve: Secondary | ICD-10-CM | POA: Diagnosis not present

## 2021-07-09 DIAGNOSIS — D62 Acute posthemorrhagic anemia: Secondary | ICD-10-CM | POA: Diagnosis not present

## 2021-07-09 DIAGNOSIS — I4811 Longstanding persistent atrial fibrillation: Secondary | ICD-10-CM | POA: Diagnosis not present

## 2021-07-09 DIAGNOSIS — K922 Gastrointestinal hemorrhage, unspecified: Secondary | ICD-10-CM | POA: Diagnosis not present

## 2021-07-09 LAB — TYPE AND SCREEN
ABO/RH(D): O POS
Antibody Screen: NEGATIVE
Unit division: 0
Unit division: 0

## 2021-07-09 LAB — CBC WITH DIFFERENTIAL/PLATELET
Abs Immature Granulocytes: 0.2 10*3/uL — ABNORMAL HIGH (ref 0.00–0.07)
Basophils Absolute: 0 10*3/uL (ref 0.0–0.1)
Basophils Relative: 0 %
Eosinophils Absolute: 0.2 10*3/uL (ref 0.0–0.5)
Eosinophils Relative: 4 %
HCT: 25.3 % — ABNORMAL LOW (ref 39.0–52.0)
Hemoglobin: 8.7 g/dL — ABNORMAL LOW (ref 13.0–17.0)
Lymphocytes Relative: 12 %
Lymphs Abs: 0.5 10*3/uL — ABNORMAL LOW (ref 0.7–4.0)
MCH: 31.2 pg (ref 26.0–34.0)
MCHC: 34.4 g/dL (ref 30.0–36.0)
MCV: 90.7 fL (ref 80.0–100.0)
Metamyelocytes Relative: 3 %
Monocytes Absolute: 0.2 10*3/uL (ref 0.1–1.0)
Monocytes Relative: 5 %
Myelocytes: 1 %
Neutro Abs: 3.3 10*3/uL (ref 1.7–7.7)
Neutrophils Relative %: 74 %
Platelets: 196 10*3/uL (ref 150–400)
Promyelocytes Relative: 1 %
RBC: 2.79 MIL/uL — ABNORMAL LOW (ref 4.22–5.81)
RDW: 18.8 % — ABNORMAL HIGH (ref 11.5–15.5)
WBC: 4.4 10*3/uL (ref 4.0–10.5)
nRBC: 0 % (ref 0.0–0.2)
nRBC: 1 /100 WBC — ABNORMAL HIGH

## 2021-07-09 LAB — COMPREHENSIVE METABOLIC PANEL
ALT: 18 U/L (ref 0–44)
AST: 26 U/L (ref 15–41)
Albumin: 2 g/dL — ABNORMAL LOW (ref 3.5–5.0)
Alkaline Phosphatase: 44 U/L (ref 38–126)
Anion gap: 7 (ref 5–15)
BUN: 30 mg/dL — ABNORMAL HIGH (ref 8–23)
CO2: 23 mmol/L (ref 22–32)
Calcium: 7.8 mg/dL — ABNORMAL LOW (ref 8.9–10.3)
Chloride: 101 mmol/L (ref 98–111)
Creatinine, Ser: 1.2 mg/dL (ref 0.61–1.24)
GFR, Estimated: 56 mL/min — ABNORMAL LOW (ref >60)
Glucose, Bld: 104 mg/dL — ABNORMAL HIGH (ref 70–99)
Potassium: 4 mmol/L (ref 3.5–5.1)
Sodium: 131 mmol/L — ABNORMAL LOW (ref 135–145)
Total Bilirubin: 0.5 mg/dL (ref 0.3–1.2)
Total Protein: 5 g/dL — ABNORMAL LOW (ref 6.5–8.1)

## 2021-07-09 LAB — PROTIME-INR
INR: 1.1 (ref 0.8–1.2)
Prothrombin Time: 14.1 seconds (ref 11.4–15.2)

## 2021-07-09 LAB — BPAM RBC
Blood Product Expiration Date: 202301162359
Blood Product Expiration Date: 202302022359
ISSUE DATE / TIME: 202301111042
ISSUE DATE / TIME: 202301111411
Unit Type and Rh: 5100
Unit Type and Rh: 9500

## 2021-07-09 LAB — CBC
HCT: 30 % — ABNORMAL LOW (ref 39.0–52.0)
Hemoglobin: 9.8 g/dL — ABNORMAL LOW (ref 13.0–17.0)
MCH: 30.2 pg (ref 26.0–34.0)
MCHC: 32.7 g/dL (ref 30.0–36.0)
MCV: 92.6 fL (ref 80.0–100.0)
Platelets: 167 10*3/uL (ref 150–400)
RBC: 3.24 MIL/uL — ABNORMAL LOW (ref 4.22–5.81)
RDW: 19.4 % — ABNORMAL HIGH (ref 11.5–15.5)
WBC: 4.3 10*3/uL (ref 4.0–10.5)
nRBC: 0.5 % — ABNORMAL HIGH (ref 0.0–0.2)

## 2021-07-09 LAB — MAGNESIUM: Magnesium: 1.9 mg/dL (ref 1.7–2.4)

## 2021-07-09 LAB — BRAIN NATRIURETIC PEPTIDE: B Natriuretic Peptide: 451 pg/mL — ABNORMAL HIGH (ref 0.0–100.0)

## 2021-07-09 MED ORDER — HEPARIN (PORCINE) 25000 UT/250ML-% IV SOLN
1250.0000 [IU]/h | INTRAVENOUS | Status: DC
  Administered 2021-07-09: 1250 [IU]/h via INTRAVENOUS
  Administered 2021-07-10 – 2021-07-12 (×3): 1350 [IU]/h via INTRAVENOUS
  Administered 2021-07-13 – 2021-07-14 (×2): 1300 [IU]/h via INTRAVENOUS
  Administered 2021-07-14 – 2021-07-15 (×2): 1250 [IU]/h via INTRAVENOUS
  Filled 2021-07-09 (×9): qty 250

## 2021-07-09 NOTE — Progress Notes (Signed)
Patient ID: Richard Spears, male   DOB: 1926-06-18, 88 y.o.   MRN: [id number] H&H is stable, will resume heparin drip, no bolus, lower therapeutic range. Phillips Climes MD

## 2021-07-09 NOTE — Progress Notes (Signed)
Occupational Therapy Treatment Patient Details Name: Richard Spears MRN: [id number] DOB: [birth year] Today's Date: 07/09/2021   History of present illness 88 y.o. male presentign to ED 12/28 with melana. Patient admitted with GI bleed and anemia. CT (+) lower GI bleed vs mass. CT also found large L inguinal hernia containing nonobstructed loop of large bowel. Colonoscopy 12/31 confirmed near obstructing mass in transverse colon. Biopsies obtained; path pending. S/p partial transverse colectomy 1/3. PMH includes: A-fib, BPH, OA, HTN, BLE venous ulcers, renal insufficiency, mechanical mitral valve replacement, Hx of fall 08/2020 s/p L femur ORIF and R/L hip arthroplasties.   OT comments  Patient sitting up in recliner stating he felt fine but weak. Patient performed stands from recliner requiring min assist to stand and min guard for balance. Patient tolerated 30 seconds for each stand x3 and required increased time to recover before attempting more stands. Patient instructed on UE AROM exercises to address HEP. Acute OT to continue to follow.    Recommendations for follow up therapy are one component of a multi-disciplinary discharge planning process, led by the attending physician.  Recommendations may be updated based on patient status, additional functional criteria and insurance authorization.    Follow Up Recommendations  Skilled nursing-short term rehab (<3 hours/day)    Assistance Recommended at Discharge Frequent or constant Supervision/Assistance  Patient can return home with the following  A little help with walking and/or transfers;A lot of help with bathing/dressing/bathroom;Assistance with cooking/housework;Direct supervision/assist for medications management;Direct supervision/assist for financial management;Assist for transportation;Help with stairs or ramp for entrance   Equipment Recommendations  None recommended by OT    Recommendations for Other Services      Precautions  / Restrictions Precautions Precautions: Fall Precaution Comments: Hx of falls; monitor HR; abdominal incision w/ precautions; BLE unna boots Restrictions Weight Bearing Restrictions: No       Mobility Bed Mobility               General bed mobility comments: up in recliner upon entry    Transfers Overall transfer level: Needs assistance Equipment used: Rolling walker (2 wheels) Transfers: Sit to/from Stand Sit to Stand: Min assist           General transfer comment: performed 3 stands from recliner with min assist to stand     Balance Overall balance assessment: Needs assistance Sitting-balance support: No upper extremity supported;Feet supported Sitting balance-Leahy Scale: Fair     Standing balance support: Bilateral upper extremity supported Standing balance-Leahy Scale: Poor Standing balance comment: performed 3 stands from recliner with min guard for balance and tolerated 30 seconds each stand                           ADL either performed or assessed with clinical judgement   ADL Overall ADL's : Needs assistance/impaired Eating/Feeding: Set up;Sitting Eating/Feeding Details (indicate cue type and reason): required assitance opening containers                                        Extremity/Trunk Assessment              Vision       Perception     Praxis      Cognition Arousal/Alertness: Awake/alert Behavior During Therapy: WFL for tasks assessed/performed Overall Cognitive Status: Within Functional Limits for tasks assessed  General Comments: difficulty with recall. oriented x4          Exercises Exercises: General Upper Extremity General Exercises - Upper Extremity Shoulder Flexion: AROM;Both;10 reps;Seated Shoulder Extension: AROM;Both;10 reps;Seated Shoulder ABduction: AROM;Both;10 reps;Seated Shoulder ADduction: AROM;Both;10 reps;Seated   Shoulder  Instructions       General Comments      Pertinent Vitals/ Pain       Pain Assessment: No/denies pain Faces Pain Scale: No hurt  Home Living                                          Prior Functioning/Environment              Frequency  Min 2X/week        Progress Toward Goals  OT Goals(current goals can now be found in the care plan section)  Progress towards OT goals: Progressing toward goals  Acute Rehab OT Goals Patient Stated Goal: get better OT Goal Formulation: With patient Time For Goal Achievement: 07/15/21 Potential to Achieve Goals: Good ADL Goals Pt Will Perform Grooming: with modified independence;standing Pt Will Perform Upper Body Dressing: with modified independence;sitting Pt Will Perform Lower Body Dressing: with modified independence;sit to/from stand Pt Will Transfer to Toilet: with modified independence;ambulating Pt Will Perform Toileting - Clothing Manipulation and hygiene: with modified independence;sit to/from stand Pt/caregiver will Perform Home Exercise Program: Increased ROM;Increased strength;Both right and left upper extremity;Independently Additional ADL Goal #1: Patient will recall 3 strategies to reduce risk of falls in prep for safe d/c home.  Plan Discharge plan remains appropriate;Frequency remains appropriate    Co-evaluation                 AM-PAC OT "6 Clicks" Daily Activity     Outcome Measure   Help from another person eating meals?: A Little Help from another person taking care of personal grooming?: A Little Help from another person toileting, which includes using toliet, bedpan, or urinal?: A Lot Help from another person bathing (including washing, rinsing, drying)?: A Lot Help from another person to put on and taking off regular upper body clothing?: A Little Help from another person to put on and taking off regular lower body clothing?: A Lot 6 Click Score: 15    End of Session Equipment  Utilized During Treatment: Rolling walker (2 wheels)  OT Visit Diagnosis: Other abnormalities of gait and mobility (R26.89);Muscle weakness (generalized) (M62.81);Pain;History of falling (Z91.81)   Activity Tolerance Patient tolerated treatment well   Patient Left in chair;with call bell/phone within reach;with chair alarm set   Nurse Communication Mobility status        Time: 1287-8676 OT Time Calculation (min): 33 min  Charges: OT General Charges $OT Visit: 1 Visit OT Treatments $Therapeutic Activity: 8-22 mins $Therapeutic Exercise: 8-22 mins  Lodema Hong, OTA Acute Rehabilitation Services  Pager [phone] Office [phone]   Trixie Dredge 07/09/2021, 1:10 PM

## 2021-07-09 NOTE — Progress Notes (Signed)
PROGRESS NOTE                                                                                                                                                                                                             Patient Demographics:    Richard Spears, is a 88 y.o. male, DOB - [birth year], RWE:[id number]  Outpatient Primary MD for the patient is Richard Orn, MD    LOS - 4  Admit date - 06/24/2021    Chief Complaint  Patient presents with   GI Problem       Brief Narrative (HPI from H&P)   - 89/M from independent living with spouse with history of paroxysmal atrial fibrillation and mechanical mitral valve replacement with St. Jude's metallic valve on Coumadin, chronic anemia, hypertension, chronic diastolic CHF, presented to the ED with 2 episodes of hematochezia, further work-up showed large colon mass underwent colon resection.  Currently on heparin drip for MVR.   Subjective:   Patient had rectal tube inserted yesterday, has no significant output since, will DC his rectal tube      Assessment  & Plan :    Acute lower GI bleed with colonoscopy done on 06/27/2021 showing large transverse colon mass with acute blood loss related anemia  - lower GI bleed- he received 1 unit of packed RBC transfusion earlier this admission, seen by GI and general surgery, he underwent colonoscopy and which was positive for colon mass biopsy suggestive of poorly differentiated adenocarcinoma, he was subsequently seen by general surgery and underwent surgical resection of the mass on 06/30/2021 by general surgery.   -He was subsequently started on heparin infusion and he tolerated it well for a few days however on 07/03/2021 he started having bright red blood per rectum requiring 2 units of packed RBC on 07/04/2020, anticoagulation has been held for now 48 hours H&H seems to have stabilized.  Has been started without bolus with caution on  07/06/2021 with stable H&H so far.  Continue to monitor closely no signs of current ongoing bleeding, still not starting Coumadin. -Strength remains with intermittent lower GI bleed, with melena/bright red blood per rectum in the setting of recent GI surgery, with possible intermittent bleeding at anastomosis site in the setting of anticoagulation, general surgery input greatly appreciated. -Most recent transfusion was 2 units on 1/11, hemoglobin is trending  down over last 24 hours 9.3>8.7, will check this afternoon, will keep holding heparin GTT over the next 24 hours, will consider resuming in a.m. if H&H remained stable. -Patient rectal tube inserted yesterday, but no significant output in the tube since even though it appears melena (he is on iron p.o. as well), will DC rectal tube and monitor output closely.  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   - Present on admission.  S/p colon resection see #1 above, bowel activity has not returned fully, requested to advance activity, will monitor closely on liquid diet per surgery.  CKD 3B.  Baseline creatinine around 1.5.  Monitor closely.  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  Resolved for now.  MVR.   - Coagulation on hold due  above, now he is with evidence with recurrent bleed, I will go ahead and hold his heparin GTT, likely need to hold for another 1 to 2 days till his hemoglobin has stabilized and no further evidence of GI bleed.  -I have discussed with the patient and the son, there is risk involved either way, but at this point given his active bleed we will go ahead and hold his anticoagulation for next 24 to 48 hours.  Paroxysmal A. fib Mali vas 2 score of 3.  See above for anticoagulation.  on low-dose Lopressor and monitor.        Condition - Fair  Family Communication  : [name] [phone] updated 07/08/2021  Code Status :  DNR  Consults  : General surgery, GI, Oncology,  PUD Prophylaxis  :     Procedures  :     Colonoscopy done on 06/27/2021 showing large transverse colon mass.  Colon resection by general surgery on 06/30/2021      Disposition Plan  :    Status is: Inpatient  Remains inpatient appropriate because: Heparin drip, MVR, colon cancer   DVT Prophylaxis  : Heparin drip intermittently, SCD  Place and maintain sequential compression device Start: 07/04/21 1358 SCDs Start: 06/24/21 2115     Lab Results  Component Value Date   PLT 196 07/09/2021    Diet :  Diet Order             Diet regular Room service appropriate? Yes; Fluid consistency: Thin  Diet effective now                    Inpatient Medications  Scheduled Meds:  acetaminophen  1,000 mg Oral Q6H   Or   acetaminophen  650 mg Rectal Q6H   docusate sodium  100 mg Oral BID   feeding supplement  1 Container Oral TID BM   ferrous sulfate  325 mg Oral TID WC   metoprolol tartrate  25 mg Oral BID   pantoprazole  40 mg Intravenous Q12H   sodium chloride flush  3 mL Intravenous Q12H   Continuous Infusions:   PRN Meds:.fentaNYL (SUBLIMAZE) injection, LORazepam, ondansetron (ZOFRAN) IV, oxyCODONE  Antibiotics  :    Anti-infectives (From admission, onward)    None         Phillips Climes M.D on 07/09/2021 at 1:27 PM  To page go to [email or web address]   [facility] -  Office  [phone]  See all Orders from today for further details    Objective:   Vitals:   07/09/21 0500 07/09/21 0600 07/09/21 0740 07/09/21 1152  BP: (!) 118/58  128/74 135/73  Pulse: 61 79 79 88  Resp: 17  18 19  Temp:   (!) 97.4 F (36.3 C) (!) 97.4 F (36.3 C)  TempSrc:   Oral Oral  SpO2: 97% 98% 97% 100%  Weight:      Height:        Wt Readings from Last 3 Encounters:  06/27/21 68 kg  04/28/21 68.1 kg  03/06/21 72 kg     Intake/Output Summary (Last 24 hours) at 07/09/2021 1327 Last data filed at 07/09/2021 1203 Gross per 24 hour  Intake 716.58 ml  Output 2915 ml  Net  -2198.42 ml     Physical Exam  Awake Alert, Oriented X 3, No new F.N deficits, Normal affect frail, thin appearing Symmetrical Chest wall movement, Good air movement bilaterally, CTAB RRR,No Gallops, has mechanical click +ve B.Sounds, Abd Soft, left groin hernia, reproducible, midline surgical wound in abdomen No Cyanosis, Clubbing or edema, No new Rash or bruise       Data Review:    CBC Recent Labs  Lab 07/07/21 0113 07/07/21 0812 07/07/21 1440 07/08/21 0110 07/08/21 0617 07/08/21 1907 07/09/21 0123  WBC 4.8 4.5 5.4 4.4 4.0 4.9 4.4  HGB 7.9* 8.3* 8.3* 7.0* 7.1* 9.3* 8.7*  HCT 23.9* 25.0* 25.4* 22.1* 22.2* 27.7* 25.3*  PLT 140* 162 179 136* PLATELET CLUMPS NOTED ON SMEAR, COUNT APPEARS ADEQUATE 184 196  MCV 94.1 94.0 93.7 95.7 94.9 90.5 90.7  MCH 31.1 31.2 30.6 30.3 30.3 30.4 31.2  MCHC 33.1 33.2 32.7 31.7 32.0 33.6 34.4  RDW 19.6* 19.5* 19.9* 19.6* 19.6* 18.7* 18.8*  LYMPHSABS 0.7 0.6* 0.6* 0.7  --   --  0.5*  MONOABS 0.5 0.4 0.5 0.4  --   --  0.2  EOSABS 0.2 0.3 0.2 0.3  --   --  0.2  BASOSABS 0.0 0.0 0.0 0.0  --   --  0.0    Electrolytes Recent Labs  Lab 07/04/21 0110 07/05/21 0053 07/06/21 0156 07/06/21 1216 07/07/21 0113 07/08/21 0110 07/09/21 0123  NA  --  138  --  134* 134* 132* 131*  K  --  4.2  --  4.0 4.1 4.7 4.0  CL  --  108  --  106 107 105 101  CO2  --  23  --  24 21* 21* 23  GLUCOSE  --  96  --  128* 96 94 104*  BUN  --  21  --  20 24* 31* 30*  CREATININE  --  1.16  --  1.42* 1.24 1.12 1.20  CALCIUM  --  8.1*  --  7.8* 7.7* 7.8* 7.8*  AST  --  17  --  20 19 22 26   ALT  --  11  --  13 14 14 18   ALKPHOS  --  37*  --  42 44 44 44  BILITOT  --  0.6  --  0.5 0.7 0.5 0.5  ALBUMIN  --  2.1*  --  2.1* 2.0* 1.9* 2.0*  MG  --  1.8  --  1.9 1.9 1.8 1.9  INR  --  1.3* 1.4*  --  1.3* 1.3* 1.1  BNP 526.5* 613.6*  --   --  530.9* 437.7* 451.0*     ------------------------------------------------------------------------------------------------------------------ No results for input(s): CHOL, HDL, LDLCALC, TRIG, CHOLHDL, LDLDIRECT in the last 72 hours.  No results found for: HGBA1C  No results for input(s): TSH, T4TOTAL, T3FREE, THYROIDAB in the last 72 hours.  Invalid input(s): FREET3 ------------------------------------------------------------------------------------------------------------------ ID Labs Recent Labs  Lab 07/05/21 0053 07/05/21 1240  07/06/21 1216 07/06/21 1928 07/07/21 0113 07/07/21 0812 07/07/21 1440 07/08/21 0110 07/08/21 0617 07/08/21 1907 07/09/21 0123  WBC 3.7*   < >  --    < > 4.8   < > 5.4 4.4 4.0 4.9 4.4  PLT 150   < >  --    < > 140*   < > 179 136* PLATELET CLUMPS NOTED ON SMEAR, COUNT APPEARS ADEQUATE 184 196  CREATININE 1.16  --  1.42*  --  1.24  --   --  1.12  --   --  1.20   < > = values in this interval not displayed.   Cardiac Enzymes No results for input(s): CKMB, TROPONINI, MYOGLOBIN in the last 168 hours.  Invalid input(s): CK   Radiology Reports US RENAL  Result Date: 07/06/2021 CLINICAL DATA:  Acute kidney injury EXAM: RENAL / URINARY TRACT ULTRASOUND COMPLETE COMPARISON:  CT 06/24/2021 FINDINGS: Right Kidney: Renal measurements: 10.5 x 4.2 x 4.8 cm = volume: 110.2 mL. Cortex appears slightly echogenic. No hydronephrosis. Small cysts measuring up to 11 mm at the midpole. Left Kidney: Renal measurements: 9.9 x 4.8 x 4.1 cm = volume: 94.7 mL. Cortex appears slightly echogenic. No mass or hydronephrosis. Bladder: Slightly thick-walled urinary bladder Other: None. IMPRESSION: 1. Kidneys are slightly echogenic consistent with medical renal disease. 2. Negative for hydronephrosis 3. Slightly thick-walled urinary bladder Electronically Signed   By: Donavan Foil M.D.   On: 07/06/2021 16:42     Patient ID: [name], male   DOB: 21-Dec-1925, 88 y.o.   MRN: [id number] Patient ID:  THERIN VETSCH, male   DOB: February 23, 1926, 88 y.o.   MRN: [id number]

## 2021-07-09 NOTE — Progress Notes (Signed)
Patient ID: Richard Spears, male   DOB: Nov 11, 1925, 88 y.o.   MRN: [id number] [facility] Surgery Progress Note  9 Days Post-Op  Subjective: CC-  Sitting up eating breakfast. States that he is in better spirits today. Denies abdominal pain, n/v. Tolerating diet. Passing flatus. More blood per rectum but no BM yesterday. Hgb 9.3 yesterday posttransfusion, Hgb 8.7 this morning.  Objective: Vital signs in last 24 hours: Temp:  [97.3 F (36.3 C)-98.4 F (36.9 C)] 97.4 F (36.3 C) (01/12 0740) Pulse Rate:  [59-101] 79 (01/12 0740) Resp:  [16-20] 18 (01/12 0740) BP: (98-157)/(47-100) 128/74 (01/12 0740) SpO2:  [93 %-99 %] 97 % (01/12 0740) Last BM Date: 07/08/21 (RECTAL POUCH TO WALL SUCTION LIQ STOOLS BLACK)  Intake/Output from previous day: 01/11 0701 - 01/12 0700 In: 1473.1 [I.V.:3; Blood:1470.1] Out: 2615 [Urine:2390; Stool:225] Intake/Output this shift: No intake/output data recorded.  PE: Gen:  Alert, NAD, pleasant Pulm: rate and effort normal Abd: Soft, ND, nontender, midline incision cdi with staples present and no erythema or drainage  Lab Results:  Recent Labs    07/08/21 1907 07/09/21 0123  WBC 4.9 4.4  HGB 9.3* 8.7*  HCT 27.7* 25.3*  PLT 184 196   BMET Recent Labs    07/08/21 0110 07/09/21 0123  NA 132* 131*  K 4.7 4.0  CL 105 101  CO2 21* 23  GLUCOSE 94 104*  BUN 31* 30*  CREATININE 1.12 1.20  CALCIUM 7.8* 7.8*   PT/INR Recent Labs    07/08/21 0110 07/09/21 0123  LABPROT 15.9* 14.1  INR 1.3* 1.1   CMP     Component Value Date/Time   NA 131 (L) 07/09/2021 0123   NA 134 (A) 10/23/2020 1035   NA 130 10/20/2017 0000   K 4.0 07/09/2021 0123   K 4.2 10/20/2017 0000   CL 101 07/09/2021 0123   CO2 23 07/09/2021 0123   GLUCOSE 104 (H) 07/09/2021 0123   BUN 30 (H) 07/09/2021 0123   BUN 33 (A) 10/23/2020 1035   CREATININE 1.20 07/09/2021 0123   CREATININE 1.38 10/20/2017 0000   CALCIUM 7.8 (L) 07/09/2021 0123   CALCIUM 8.6  10/20/2017 0000   PROT 5.0 (L) 07/09/2021 0123   ALBUMIN 2.0 (L) 07/09/2021 0123   AST 26 07/09/2021 0123   ALT 18 07/09/2021 0123   ALKPHOS 44 07/09/2021 0123   BILITOT 0.5 07/09/2021 0123   GFRNONAA 56 (L) 07/09/2021 0123   GFRAA 61 10/23/2020 1035   Lipase     Component Value Date/Time   LIPASE 34 06/24/2021 1220       Studies/Results: No results found.  Anti-infectives: Anti-infectives (From admission, onward)    None        Assessment/Plan POD #9, s/p ex lap with segmental TC resection for adenocarcinoma, Dr. Kae Heller 1/3 - surgical path T2n0 adenocarcinoma - regular diet - PT/OT - rec SNF Acute Blood Loss Anemia after surgery  - likely intermittent oozing from anastomosis due to anticoagulation requirement. Abdominal exam is benign. Received 2 units PRBCs 07/04/21 and again 1/11. Hgb 8.7 from 9.3 after transfusion, continue holding heparin gtt. Watch for further hematochezia and follow h/h   FEN - reg diet VTE - hold heparin gtt and holding coumadin ID - none currently Foley - out 1/5 and voiding   A fib MVR on coumadin CKD   LOS: 15 days    Wellington Hampshire, [facility] Surgery 07/09/2021, 9:39 AM Please see Amion for pager number during day hours  7:00am-4:30pm

## 2021-07-09 NOTE — Progress Notes (Signed)
° ° °  Subjective:  No cardiac complaints Rectal tube and black stools Hct down again   Objective:  Vitals:   07/09/21 0400 07/09/21 0500 07/09/21 0600 07/09/21 0740  BP: (!) 116/57 (!) 118/58  128/74  Pulse: 64 61 79 79  Resp: 16 17  18   Temp:    (!) 97.4 F (36.3 C)  TempSrc:    Oral  SpO2: 98% 97% 98% 97%  Weight:      Height:        Intake/Output from previous day:  Intake/Output Summary (Last 24 hours) at 07/09/2021 0821 Last data filed at 07/09/2021 0500 Gross per 24 hour  Intake 1473.08 ml  Output 2615 ml  Net -1141.92 ml    Physical Exam: Thin white male S1 click no MR murmur Abdomen soft BS positive post colectomy  No edema Palpable pedal pulses   Lab Results: Basic Metabolic Panel: Recent Labs    07/08/21 0110 07/09/21 0123  NA 132* 131*  K 4.7 4.0  CL 105 101  CO2 21* 23  GLUCOSE 94 104*  BUN 31* 30*  CREATININE 1.12 1.20  CALCIUM 7.8* 7.8*  MG 1.8 1.9   Liver Function Tests: Recent Labs    07/08/21 0110 07/09/21 0123  AST 22 26  ALT 14 18  ALKPHOS 44 44  BILITOT 0.5 0.5  PROT 4.7* 5.0*  ALBUMIN 1.9* 2.0*   No results for input(s): LIPASE, AMYLASE in the last 72 hours. CBC: Recent Labs    07/08/21 0110 07/08/21 0617 07/08/21 1907 07/09/21 0123  WBC 4.4   < > 4.9 4.4  NEUTROABS 2.8  --   --  3.3  HGB 7.0*   < > 9.3* 8.7*  HCT 22.1*   < > 27.7* 25.3*  MCV 95.7   < > 90.5 90.7  PLT 136*   < > 184 196   < > = values in this interval not displayed.    Imaging: No results found.  Cardiac Studies:  ECG: afib chronic    Telemetry:  afib   Echo: pending   Medications:    acetaminophen  1,000 mg Oral Q6H   Or   acetaminophen  650 mg Rectal Q6H   docusate sodium  100 mg Oral BID   feeding supplement  1 Container Oral TID BM   ferrous sulfate  325 mg Oral TID WC   metoprolol tartrate  25 mg Oral BID   pantoprazole  40 mg Intravenous Q12H   sodium chloride flush  3 mL Intravenous Q12H        Assessment/Plan:   Afib:   chronic rate control fine coumadin held for surgery and heparin held for post op rectal bleeding  and again yesterday for falling Hct Has received more blood Hct only 25.3 this am BNP elevated but not volume overloaded and I/O negative over a liter  MVR:  88 yo St Jude mechanical valve normal valve clicks on exam Normal function by TTE  GI bleed:  adenocarcinoma color post surgery Has received multiple transfusions 25.3  this am Would ask surgery what plan is for identifying recurrent source of bleeding   He has an old mechanical MV and chronic afib and is high risk for CVA off anticoagulation for prolonged period of time   Jenkins Rouge 07/09/2021, 8:21 AM

## 2021-07-09 NOTE — Progress Notes (Signed)
ANTICOAGULATION CONSULT NOTE - Follow Up Consult  Pharmacy Consult for Heparin Indication:  mechanical mitral valve and atrial fibrillation  Allergies  Allergen Reactions   Flexeril [Cyclobenzaprine] Diarrhea    Patient Measurements: Height: 6' (182.9 cm) Weight: 68 kg (149 lb 14.6 oz) IBW/kg (Calculated) : 77.6 Heparin Dosing Weight: 68 kg  Vital Signs: Temp: 97.5 F (36.4 C) (01/12 1621) Temp Source: Oral (01/12 1621) BP: 121/59 (01/12 1621) Pulse Rate: 87 (01/12 1621)  Labs: Recent Labs    07/07/21 0113 07/07/21 0812 07/07/21 1440 07/08/21 0110 07/08/21 0617 07/08/21 1907 07/09/21 0123 07/09/21 1304  HGB 7.9* 8.3*   < > 7.0*   < > 9.3* 8.7* 9.8*  HCT 23.9* 25.0*   < > 22.1*   < > 27.7* 25.3* 30.0*  PLT 140* 162   < > 136*   < > 184 196 167  LABPROT 15.9*  --   --  15.9*  --   --  14.1  --   INR 1.3*  --   --  1.3*  --   --  1.1  --   HEPARINUNFRC  --  0.66  --  0.28*  --   --   --   --   CREATININE 1.24  --   --  1.12  --   --  1.20  --    < > = values in this interval not displayed.    Estimated Creatinine Clearance: 37 mL/min (by C-G formula based on SCr of 1.2 mg/dL).  Assessment: 88 yo M admitted with GIB on 12/28. PTA warfarin (LD 12/28 @ 0730) for mechanical mitral valve and atrial fibrillation. INR 3.9 on admission > IV vitamin k 5mg  12/29 > INR 1.2 on recheck. Pharmacy consulted to start IV heparin while warfarin is on hold. Heparin held 1/11 am due to melena, likely intermittent oozing from anastamosis per Surgery.  Hemoglobin improved after 2 units PRBCs on 1/11.  INR down to 1.1.   To resume heparin drip tonight (held ~36 hrs). Will continue to target low therapeutic range and avoid heparin boluses.  Last heparin level 0.28 on 1/11 am on 1250 units/hr, but was 0.66 on 1/10 on 1300 units/hr.  Goal of Therapy:  Heparin level 0.3-0.5 units/ml Monitor platelets by anticoagulation protocol: Yes   Plan:  Resume heparin drip at 1250 units/hr Heparin  level ~ 8 hrs after drip resumes. Daily heparin level and CBC. Monitor for bleeding. Warfarin on hold.  Arty Baumgartner, RPh 07/09/2021,7:28 PM

## 2021-07-09 NOTE — Plan of Care (Signed)

## 2021-07-09 NOTE — Progress Notes (Signed)
RN at bedside. Patient appears calm and free of pain. Per MD order; rectal pouch removed by RN; patient tolerated well. Patient declined pain medication at this time. All questions by patient answered by RN. Stool is noted to appear black in color. MD notified. Will continue to monitor

## 2021-07-10 ENCOUNTER — Inpatient Hospital Stay (HOSPITAL_COMMUNITY): Payer: Medicare Other

## 2021-07-10 DIAGNOSIS — Z954 Presence of other heart-valve replacement: Secondary | ICD-10-CM | POA: Diagnosis not present

## 2021-07-10 DIAGNOSIS — K922 Gastrointestinal hemorrhage, unspecified: Secondary | ICD-10-CM | POA: Diagnosis not present

## 2021-07-10 DIAGNOSIS — M7989 Other specified soft tissue disorders: Secondary | ICD-10-CM

## 2021-07-10 DIAGNOSIS — I4811 Longstanding persistent atrial fibrillation: Secondary | ICD-10-CM | POA: Diagnosis not present

## 2021-07-10 DIAGNOSIS — D62 Acute posthemorrhagic anemia: Secondary | ICD-10-CM | POA: Diagnosis not present

## 2021-07-10 DIAGNOSIS — Z952 Presence of prosthetic heart valve: Secondary | ICD-10-CM | POA: Diagnosis not present

## 2021-07-10 DIAGNOSIS — E871 Hypo-osmolality and hyponatremia: Secondary | ICD-10-CM | POA: Diagnosis not present

## 2021-07-10 DIAGNOSIS — M79601 Pain in right arm: Secondary | ICD-10-CM

## 2021-07-10 DIAGNOSIS — L538 Other specified erythematous conditions: Secondary | ICD-10-CM

## 2021-07-10 LAB — COMPREHENSIVE METABOLIC PANEL
ALT: 23 U/L (ref 0–44)
AST: 35 U/L (ref 15–41)
Albumin: 2.1 g/dL — ABNORMAL LOW (ref 3.5–5.0)
Alkaline Phosphatase: 45 U/L (ref 38–126)
Anion gap: 6 (ref 5–15)
BUN: 28 mg/dL — ABNORMAL HIGH (ref 8–23)
CO2: 23 mmol/L (ref 22–32)
Calcium: 8.1 mg/dL — ABNORMAL LOW (ref 8.9–10.3)
Chloride: 101 mmol/L (ref 98–111)
Creatinine, Ser: 1.28 mg/dL — ABNORMAL HIGH (ref 0.61–1.24)
GFR, Estimated: 52 mL/min — ABNORMAL LOW (ref >60)
Glucose, Bld: 86 mg/dL (ref 70–99)
Potassium: 4.2 mmol/L (ref 3.5–5.1)
Sodium: 130 mmol/L — ABNORMAL LOW (ref 135–145)
Total Bilirubin: 0.6 mg/dL (ref 0.3–1.2)
Total Protein: 5.1 g/dL — ABNORMAL LOW (ref 6.5–8.1)

## 2021-07-10 LAB — ECHOCARDIOGRAM COMPLETE
AR max vel: 1.98 cm2
AV Area VTI: 1.94 cm2
AV Area mean vel: 1.95 cm2
AV Mean grad: 5.5 mmHg
AV Peak grad: 10.1 mmHg
Ao pk vel: 1.59 m/s
Area-P 1/2: 2.87 cm2
Height: 72 in
MV VTI: 1.6 cm2
P 1/2 time: 364 msec
S' Lateral: 3.1 cm
Weight: 2398.6 oz

## 2021-07-10 LAB — CBC WITH DIFFERENTIAL/PLATELET
Abs Immature Granulocytes: 0.19 10*3/uL — ABNORMAL HIGH (ref 0.00–0.07)
Basophils Absolute: 0 10*3/uL (ref 0.0–0.1)
Basophils Relative: 1 %
Eosinophils Absolute: 0.2 10*3/uL (ref 0.0–0.5)
Eosinophils Relative: 5 %
HCT: 26.3 % — ABNORMAL LOW (ref 39.0–52.0)
Hemoglobin: 8.8 g/dL — ABNORMAL LOW (ref 13.0–17.0)
Immature Granulocytes: 5 %
Lymphocytes Relative: 20 %
Lymphs Abs: 0.8 10*3/uL (ref 0.7–4.0)
MCH: 31.2 pg (ref 26.0–34.0)
MCHC: 33.5 g/dL (ref 30.0–36.0)
MCV: 93.3 fL (ref 80.0–100.0)
Monocytes Absolute: 0.4 10*3/uL (ref 0.1–1.0)
Monocytes Relative: 10 %
Neutro Abs: 2.3 10*3/uL (ref 1.7–7.7)
Neutrophils Relative %: 59 %
Platelets: 173 10*3/uL (ref 150–400)
RBC: 2.82 MIL/uL — ABNORMAL LOW (ref 4.22–5.81)
RDW: 18.8 % — ABNORMAL HIGH (ref 11.5–15.5)
WBC: 3.8 10*3/uL — ABNORMAL LOW (ref 4.0–10.5)
nRBC: 0 % (ref 0.0–0.2)

## 2021-07-10 LAB — CBC
HCT: 27.9 % — ABNORMAL LOW (ref 39.0–52.0)
Hemoglobin: 9 g/dL — ABNORMAL LOW (ref 13.0–17.0)
MCH: 30.4 pg (ref 26.0–34.0)
MCHC: 32.3 g/dL (ref 30.0–36.0)
MCV: 94.3 fL (ref 80.0–100.0)
Platelets: 181 10*3/uL (ref 150–400)
RBC: 2.96 MIL/uL — ABNORMAL LOW (ref 4.22–5.81)
RDW: 19.2 % — ABNORMAL HIGH (ref 11.5–15.5)
WBC: 3.9 10*3/uL — ABNORMAL LOW (ref 4.0–10.5)
nRBC: 0 % (ref 0.0–0.2)

## 2021-07-10 LAB — MAGNESIUM: Magnesium: 1.9 mg/dL (ref 1.7–2.4)

## 2021-07-10 LAB — HEPARIN LEVEL (UNFRACTIONATED)
Heparin Unfractionated: 0.23 IU/mL — ABNORMAL LOW (ref 0.30–0.70)
Heparin Unfractionated: 0.45 IU/mL (ref 0.30–0.70)

## 2021-07-10 MED ORDER — CEPHALEXIN 500 MG PO CAPS
500.0000 mg | ORAL_CAPSULE | Freq: Three times a day (TID) | ORAL | Status: AC
  Administered 2021-07-10 – 2021-07-14 (×20): 500 mg via ORAL
  Filled 2021-07-10 (×22): qty 1

## 2021-07-10 MED ORDER — DOCUSATE SODIUM 100 MG PO CAPS
100.0000 mg | ORAL_CAPSULE | Freq: Two times a day (BID) | ORAL | Status: DC
  Administered 2021-07-10 – 2021-07-17 (×15): 100 mg via ORAL
  Filled 2021-07-10 (×15): qty 1

## 2021-07-10 NOTE — Progress Notes (Signed)
Patient ID: Richard Spears, male   DOB: 04-Jun-1926, 88 y.o.   MRN: [id number] [facility] Surgery Progress Note  10 Days Post-Op  Subjective: CC-  No complaints.  Eating well.  No further bloody BMs since his last one yesterday early in the day.  Heparin restarted last night at 2000.  Objective: Vital signs in last 24 hours: Temp:  [97.4 F (36.3 C)-98.5 F (36.9 C)] 97.6 F (36.4 C) (01/13 0758) Pulse Rate:  [64-113] 96 (01/13 0758) Resp:  [16-19] 16 (01/13 0758) BP: (113-146)/(59-91) 142/78 (01/13 0758) SpO2:  [95 %-100 %] 95 % (01/13 0758) Last BM Date: 07/09/21  Intake/Output from previous day: 01/12 0701 - 01/13 0700 In: 359 [P.O.:356; I.V.:3] Out: 1325 [Urine:1325] Intake/Output this shift: Total I/O In: -  Out: 200 [Urine:200]  PE: Gen:  Alert, NAD, pleasant Pulm: rate and effort normal Abd: Soft, ND, nontender, midline incision cdi with staples present and no erythema or drainage  Lab Results:  Recent Labs    07/09/21 1304 07/10/21 0506  WBC 4.3 3.8*  HGB 9.8* 8.8*  HCT 30.0* 26.3*  PLT 167 173   BMET Recent Labs    07/09/21 0123 07/10/21 0506  NA 131* 130*  K 4.0 4.2  CL 101 101  CO2 23 23  GLUCOSE 104* 86  BUN 30* 28*  CREATININE 1.20 1.28*  CALCIUM 7.8* 8.1*   PT/INR Recent Labs    07/08/21 0110 07/09/21 0123  LABPROT 15.9* 14.1  INR 1.3* 1.1   CMP     Component Value Date/Time   NA 130 (L) 07/10/2021 0506   NA 134 (A) 10/23/2020 1035   NA 130 10/20/2017 0000   K 4.2 07/10/2021 0506   K 4.2 10/20/2017 0000   CL 101 07/10/2021 0506   CO2 23 07/10/2021 0506   GLUCOSE 86 07/10/2021 0506   BUN 28 (H) 07/10/2021 0506   BUN 33 (A) 10/23/2020 1035   CREATININE 1.28 (H) 07/10/2021 0506   CREATININE 1.38 10/20/2017 0000   CALCIUM 8.1 (L) 07/10/2021 0506   CALCIUM 8.6 10/20/2017 0000   PROT 5.1 (L) 07/10/2021 0506   ALBUMIN 2.1 (L) 07/10/2021 0506   AST 35 07/10/2021 0506   ALT 23 07/10/2021 0506   ALKPHOS 45 07/10/2021  0506   BILITOT 0.6 07/10/2021 0506   GFRNONAA 52 (L) 07/10/2021 0506   GFRAA 61 10/23/2020 1035   Lipase     Component Value Date/Time   LIPASE 34 06/24/2021 1220       Studies/Results: No results found.  Anti-infectives: Anti-infectives (From admission, onward)    Start     Dose/Rate Route Frequency Ordered Stop   07/10/21 0630  cephALEXin (KEFLEX) capsule 500 mg        500 mg Oral 3 times daily before meals & bedtime 07/10/21 8756 07/15/21 0629        Assessment/Plan POD #10, s/p ex lap with segmental TC resection for adenocarcinoma, Dr. Kae Heller 1/3 - surgical path T2n0 adenocarcinoma - regular diet - PT/OT - rec  -Dc staples over weekend or on Monday Acute Blood Loss Anemia after surgery  - likely intermittent oozing from anastomosis due to anticoagulation requirement. Abdominal exam is benign. Received 2 units PRBCs 07/04/21 and again 1/11.  -hgb 9.8 down to 8.8.  no further bleeding or bloody BMs since yesterday morning. -cont to closely monitor since reinitiation of heparin.   FEN - reg diet VTE - heparin restarted last pm ID - none currently Foley - out 1/5  and voiding   A fib MVR on coumadin CKD   LOS: 16 days    Henreitta Cea, [facility] Surgery 07/10/2021, 9:54 AM Please see Amion for pager number during day hours 7:00am-4:30pm

## 2021-07-10 NOTE — Progress Notes (Signed)
MD in to see pt right arm and spoke with pt . Pt was relieved to have MD at his bedside to see his right arm.

## 2021-07-10 NOTE — Progress Notes (Addendum)
HOSPITAL MEDICINE OVERNIGHT EVENT NOTE    Nursing reports that patient has been experiencing increasing redness warmth and pain of the right forearm.  According to nursing, patient has been quite anxious about it and has been repeatedly requesting the overnight provider to come evaluate.  I went to go evaluate the patient at the bedside and indeed did identify that the patient is experiencing progressive redness warmth and induration of the right forearm with some mild edema.  Clinically would appear to be a mild cellulitis, possibly originating from recent IV placement.  Is also worth noting that patient's anticoagulation was held for period of time during this hospitalization.  While unlikely - we will obtain a right upper ultrasound to ensure there is no evidence of DVT.  Cellulitis order set initiated with oral Keflex regimen initiated for mild disease.       Vernelle Emerald  MD [facility]

## 2021-07-10 NOTE — Progress Notes (Signed)
ANTICOAGULATION CONSULT NOTE - Follow Up Consult  Pharmacy Consult for Heparin Indication:  mechanical mitral valve and atrial fibrillation  Allergies  Allergen Reactions   Flexeril [Cyclobenzaprine] Diarrhea    Patient Measurements: Height: 6' (182.9 cm) Weight: 68 kg (149 lb 14.6 oz) IBW/kg (Calculated) : 77.6 Heparin Dosing Weight: 68 kg  Vital Signs: Temp: 98.1 F (36.7 C) (01/13 0328) Temp Source: Oral (01/13 0328) BP: 117/62 (01/13 0328) Pulse Rate: 70 (01/13 0328)  Labs: Recent Labs    07/07/21 0812 07/07/21 1440 07/08/21 0110 07/08/21 0617 07/09/21 0123 07/09/21 1304 07/10/21 0506  HGB 8.3*   < > 7.0*   < > 8.7* 9.8* 8.8*  HCT 25.0*   < > 22.1*   < > 25.3* 30.0* 26.3*  PLT 162   < > 136*   < > 196 167 173  LABPROT  --   --  15.9*  --  14.1  --   --   INR  --   --  1.3*  --  1.1  --   --   HEPARINUNFRC 0.66  --  0.28*  --   --   --  0.23*  CREATININE  --   --  1.12  --  1.20  --  1.28*   < > = values in this interval not displayed.     Estimated Creatinine Clearance: 34.7 mL/min (A) (by C-G formula based on SCr of 1.28 mg/dL (H)).  Assessment: 88 yo M admitted with GIB on 12/28. PTA warfarin (LD 12/28 @ 0730) for mechanical mitral valve and atrial fibrillation. INR 3.9 on admission > IV vitamin k 5mg  12/29 > INR 1.2 on recheck. Pharmacy consulted to start IV heparin while warfarin is on hold. Heparin held 1/11 am due to melena, likely intermittent oozing from anastamosis per Surgery.  Hemoglobin improved after 2 units PRBCs on 1/11.  INR down to 1.1.   To resume heparin drip tonight (held ~36 hrs). Will continue to target low therapeutic range and avoid heparin boluses.  Last heparin level 0.28 on 1/11 am on 1250 units/hr, but was 0.66 on 1/10 on 1300 units/hr.  1/13 AM update:  Heparin level low No issues per RN  Goal of Therapy:  Heparin level 0.3-0.5 units/ml Monitor platelets by anticoagulation protocol: Yes   Plan:  Inc heparin to 1350  units/hr 1400 heparin level  Watch Hgb  Narda Bonds, PharmD, BCPS Clinical Pharmacist Phone: [phone]

## 2021-07-10 NOTE — TOC Progression Note (Signed)
Transition of Care [facility]) - Progression Note    Patient Details  Name: Richard Spears MRN: [id number] Date of Birth: 20-Jan-1926  Transition of Care [facility]) CM/SW [place], LCSW Phone Number: 07/10/2021, 9:26 AM  Clinical Narrative:    CSW continuing to follow for medical readiness to discharge to SNF.      Barriers to Discharge: Continued Medical Work up  Expected Discharge Plan and Services   In-house Referral: Clinical Social Work     Living arrangements for the past 2 months: [place]                                       Social Determinants of Health (SDOH) Interventions    Readmission Risk Interventions No flowsheet data found.

## 2021-07-10 NOTE — Progress Notes (Signed)
Right upper extremity venous duplex completed. Refer to "CV Proc" under chart review to view preliminary results.  07/10/2021 10:16 AM Kelby Aline., MHA, RVT, RDCS, RDMS

## 2021-07-10 NOTE — TOC Progression Note (Signed)
Transition of Care [facility]) - Progression Note    Patient Details  Name: Richard Spears MRN: [id number] Date of Birth: 1925-12-16  Transition of Care [facility]) CM/SW [place], LCSW Phone Number: 07/10/2021, 9:25 AM  Clinical Narrative:    CSW continuing to follow for medical readiness to discharge to SNF.      Barriers to Discharge: Continued Medical Work up  Expected Discharge Plan and Services   In-house Referral: Clinical Social Work     Living arrangements for the past 2 months: [place]                                       Social Determinants of Health (SDOH) Interventions    Readmission Risk Interventions No flowsheet data found.

## 2021-07-10 NOTE — Progress Notes (Signed)
PROGRESS NOTE                                                                                                                                                                                                             Patient Demographics:    Richard Spears, is a 88 y.o. 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  Currently on heparin drip for MVR.   Subjective:   Rectal  tube discontinued yesterday, no bowel movement since, darted on Keflex for right arm cellulitis.     Assessment  & Plan :    Acute lower GI bleed with colonoscopy done on 06/27/2021 showing large transverse colon mass with acute blood loss related anemia  - lower GI bleed- he received 1 unit of packed RBC transfusion earlier this admission, seen by GI and general surgery, he underwent colonoscopy and which was positive for colon mass biopsy suggestive of poorly differentiated adenocarcinoma, he was subsequently seen by general surgery and underwent surgical resection of the mass on 06/30/2021 by general surgery.   -Very tenuous situation, as he remains with recurrent acute blood loss anemia, likely from bleeding at anastomosis site while on anticoagulation. -Hemoglobin been stable over last 24 hours, no evidence of GI bleed, has been resumed on heparin overnight, will continue to monitor H&H closely while on heparin GTT. -Patient on  oral iron, I will discontinue for now as it may cause dark stools and confusion and confusion about melena, will give IV iron intermittently.  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   - Present on admission.  S/p colon resection see #1 above, bowel activity has not returned fully, requested to advance activity, will monitor closely on liquid diet per surgery.  CKD 3B.  Baseline creatinine around 1.5.  Monitor closely.  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  Resolved for now.  MVR.   - Coagulation on hold due  above, now he is with evidence with recurrent bleed, I will go ahead and hold his heparin GTT, likely need to hold for another 1 to 2 days till his hemoglobin has stabilized and no further evidence of GI bleed.  -I have discussed with the patient and the son, there is risk involved either way, but at this point given his active bleed we will go ahead and hold his anticoagulation for next 24 to 48 hours.  Paroxysmal A. fib [place] vas 2 score of 3.  See above for anticoagulation.  on low-dose Lopressor and monitor.  Right arm cellulitis -Started on Keflex, will put warm compress as well at it started at around IV site        Condition - Fair  Family Communication  : [name] [phone] updated 07/08/2021  Code Status :  DNR  Consults  : General surgery, GI, Oncology,  PUD Prophylaxis :     Procedures  :     Colonoscopy done on 06/27/2021 showing large transverse colon mass.  Colon resection by general surgery on 06/30/2021      Disposition Plan  :    Status is: Inpatient  Remains inpatient appropriate because: Heparin drip, MVR, colon cancer   DVT Prophylaxis  : Heparin drip intermittently, SCD  Place and maintain sequential compression device Start: 07/09/21 1330 Place and maintain sequential compression device Start: 07/04/21 1358 SCDs Start: 06/24/21 2115     Lab Results  Component Value Date   PLT 173 07/10/2021     Diet :  Diet Order             Diet regular Room service appropriate? Yes; Fluid consistency: Thin  Diet effective now                    Inpatient Medications  Scheduled Meds:  acetaminophen  1,000 mg Oral Q6H   Or   acetaminophen  650 mg Rectal Q6H   cephALEXin  500 mg Oral TID AC & HS   docusate sodium  100 mg Oral BID   feeding supplement  1 Container Oral TID BM   metoprolol tartrate  25 mg Oral BID   pantoprazole  40 mg Intravenous Q12H   sodium chloride flush  3 mL Intravenous Q12H   Continuous Infusions:  heparin 1,350 Units/hr (07/10/21 0732)    PRN Meds:.fentaNYL (SUBLIMAZE) injection, LORazepam, ondansetron (ZOFRAN) IV, oxyCODONE  Antibiotics  :    Anti-infectives (From admission, onward)    Start     Dose/Rate Route Frequency Ordered Stop   07/10/21 0630  cephALEXin (KEFLEX) capsule 500 mg        500 mg Oral 3 times daily before meals & bedtime 07/10/21 0619 07/15/21 0629         Emeline Gins Lilyanna Lunt M.D on 07/10/2021 at 10:04 AM  To page go to [email or web address]   [facility] -  Office  [phone]  See all Orders from today for further details    Objective:   Vitals:   07/09/21 2200 07/09/21 2348 07/10/21 0328 07/10/21 0758  BP: (!) 122/91 113/90 117/62 (!) 142/78  Pulse:  72 70 96  Resp:  17 18 16   Temp:  98.1 F (36.7 C) 98.1 F (36.7 C) 97.6 F (36.4 C)  TempSrc:  Oral Oral Oral  SpO2:  97% 98% 95%  Weight:      Height:  Wt Readings from Last 3 Encounters:  06/27/21 68 kg  04/28/21 68.1 kg  03/06/21 72 kg     Intake/Output Summary (Last 24 hours) at 07/10/2021 1004 Last data filed at 07/10/2021 0759 Gross per 24 hour  Intake 359 ml  Output 1525 ml  Net -1166 ml     Physical Exam   Awake Alert, Oriented X 3,, thin appearing. Symmetrical Chest wall movement, Good air movement bilaterally, CTAB RRR,No Gallops, has mechanical click +ve B.Sounds, Abd Soft, No tenderness, No rebound - guarding or  rigidity. Right arm erythema     Data Review:    CBC Recent Labs  Lab 07/07/21 0812 07/07/21 1440 07/08/21 0110 07/08/21 0617 07/08/21 1907 07/09/21 0123 07/09/21 1304 07/10/21 0506  WBC 4.5 5.4 4.4 4.0 4.9 4.4 4.3 3.8*  HGB 8.3* 8.3* 7.0* 7.1* 9.3* 8.7* 9.8* 8.8*  HCT 25.0* 25.4* 22.1* 22.2* 27.7* 25.3* 30.0* 26.3*  PLT 162 179 136* PLATELET CLUMPS NOTED ON SMEAR, COUNT APPEARS ADEQUATE 184 196 167 173  MCV 94.0 93.7 95.7 94.9 90.5 90.7 92.6 93.3  MCH 31.2 30.6 30.3 30.3 30.4 31.2 30.2 31.2  MCHC 33.2 32.7 31.7 32.0 33.6 34.4 32.7 33.5  RDW 19.5* 19.9* 19.6* 19.6* 18.7* 18.8* 19.4* 18.8*  LYMPHSABS 0.6* 0.6* 0.7  --   --  0.5*  --  0.8  MONOABS 0.4 0.5 0.4  --   --  0.2  --  0.4  EOSABS 0.3 0.2 0.3  --   --  0.2  --  0.2  BASOSABS 0.0 0.0 0.0  --   --  0.0  --  0.0    Electrolytes Recent Labs  Lab 07/04/21 0110 07/05/21 0053 07/06/21 0156 07/06/21 1216 07/07/21 0113 07/08/21 0110 07/09/21 0123 07/10/21 0506  NA  --  138  --  134* 134* 132* 131* 130*  K  --  4.2  --  4.0 4.1 4.7 4.0 4.2  CL  --  108  --  106 107 105 101 101  CO2  --  23  --  24 21* 21* 23 23  GLUCOSE  --  96  --  128* 96 94 104* 86  BUN  --  21  --  20 24* 31* 30* 28*  CREATININE  --  1.16  --  1.42* 1.24 1.12 1.20 1.28*  CALCIUM  --  8.1*  --  7.8* 7.7* 7.8* 7.8* 8.1*  AST  --  17  --  20 19 22 26  35  ALT  --  11  --  13 14 14 18 23   ALKPHOS  --  37*  --  42 44 44 44 45  BILITOT  --  0.6  --  0.5 0.7 0.5 0.5 0.6  ALBUMIN  --  2.1*  --  2.1* 2.0* 1.9* 2.0* 2.1*  MG  --  1.8  --  1.9 1.9 1.8 1.9 1.9  INR  --  1.3* 1.4*  --  1.3* 1.3* 1.1  --   BNP 526.5* 613.6*  --   --  530.9* 437.7* 451.0*  --     ------------------------------------------------------------------------------------------------------------------ No results for input(s): CHOL, HDL, LDLCALC, TRIG, CHOLHDL, LDLDIRECT in the last 72 hours.  No results found for: HGBA1C  No results for input(s): TSH, T4TOTAL, T3FREE,  THYROIDAB in the last 72 hours.  Invalid input(s): FREET3 ------------------------------------------------------------------------------------------------------------------ ID Labs Recent Labs  Lab 07/06/21 1216 07/06/21 1928 07/07/21 0113 07/07/21 5621 07/08/21 0110 07/08/21 0617 07/08/21 1907 07/09/21 0123 07/09/21 1304 07/10/21  0506  WBC  --    < > 4.8   < > 4.4 4.0 4.9 4.4 4.3 3.8*  PLT  --    < > 140*   < > 136* PLATELET CLUMPS NOTED ON SMEAR, COUNT APPEARS ADEQUATE 184 196 167 173  CREATININE 1.42*  --  1.24  --  1.12  --   --  1.20  --  1.28*   < > = values in this interval not displayed.   Cardiac Enzymes No results for input(s): CKMB, TROPONINI, MYOGLOBIN in the last 168 hours.  Invalid input(s): CK   Radiology Reports US RENAL  Result Date: 07/06/2021 CLINICAL DATA:  Acute kidney injury EXAM: RENAL / URINARY TRACT ULTRASOUND COMPLETE COMPARISON:  CT 06/24/2021 FINDINGS: Right Kidney: Renal measurements: 10.5 x 4.2 x 4.8 cm = volume: 110.2 mL. Cortex appears slightly echogenic. No hydronephrosis. Small cysts measuring up to 11 mm at the midpole. Left Kidney: Renal measurements: 9.9 x 4.8 x 4.1 cm = volume: 94.7 mL. Cortex appears slightly echogenic. No mass or hydronephrosis. Bladder: Slightly thick-walled urinary bladder Other: None. IMPRESSION: 1. Kidneys are slightly echogenic consistent with medical renal disease. 2. Negative for hydronephrosis 3. Slightly thick-walled urinary bladder Electronically Signed   By: Donavan Foil M.D.   On: 07/06/2021 16:42     Patient ID: Richard Spears, male   DOB: 04/01/1926, 88 y.o.   MRN: [id number] Patient ID: Richard Spears, male   DOB: 03-27-1926, 88 y.o.   MRN: [id number] Patient ID: Richard Spears, male   DOB: [birth year], 88 y.o.   MRN: [id number]

## 2021-07-10 NOTE — Progress Notes (Signed)
ANTICOAGULATION CONSULT NOTE - Follow Up Consult  Pharmacy Consult for Heparin Indication:  mechanical mitral valve and atrial fibrillation  Allergies  Allergen Reactions   Flexeril [Cyclobenzaprine] Diarrhea    Patient Measurements: Height: 6' (182.9 cm) Weight: 68 kg (149 lb 14.6 oz) IBW/kg (Calculated) : 77.6 Heparin Dosing Weight: 68 kg  Vital Signs: Temp: 97.6 F (36.4 C) (01/13 1214) Temp Source: Oral (01/13 1214) BP: 104/49 (01/13 1214) Pulse Rate: 75 (01/13 1214)  Labs: Recent Labs    07/08/21 0110 07/08/21 0617 07/09/21 0123 07/09/21 1304 07/10/21 0506 07/10/21 1345  HGB 7.0*   < > 8.7* 9.8* 8.8* 9.0*  HCT 22.1*   < > 25.3* 30.0* 26.3* 27.9*  PLT 136*   < > 196 167 173 181  LABPROT 15.9*  --  14.1  --   --   --   INR 1.3*  --  1.1  --   --   --   HEPARINUNFRC 0.28*  --   --   --  0.23* 0.45  CREATININE 1.12  --  1.20  --  1.28*  --    < > = values in this interval not displayed.     Estimated Creatinine Clearance: 34.7 mL/min (A) (by C-G formula based on SCr of 1.28 mg/dL (H)).  Assessment: 88 yo M admitted with GIB on 12/28. PTA warfarin (LD 12/28 @ 0730) for mechanical mitral valve and atrial fibrillation. INR 3.9 on admission > IV vitamin k 5mg  12/29 > INR 1.2 on recheck. Pharmacy consulted to start IV heparin while warfarin is on hold.   Heparin on and off for bleeding, HgB now 9  Heparin level therapeutic   Goal of Therapy:  Heparin level 0.3-0.5 units/ml Monitor platelets by anticoagulation protocol: Yes   Plan:  Continue heparin at 1350 units/ hr Daily heparin level, CBC Watch Hgb  Thank you Anette Guarneri, PharmD

## 2021-07-10 NOTE — Progress Notes (Addendum)
Spoke to MD regarding pt concerns of his right arm with redness from wrist to elbow and swelling hot to touch and painful. Warm compresses x 2 applied to arm to reduce pain and swelling , pt reported it did help pain from 4 decrease to now a 2 but he is still in fear that small things like this turn into larger problems quick and wants to know what the doctor has to say about it. MD will order ultrasound for his arm due to redness/edema/pain and was off heparin drip for a while and has a mechanical valve that could develop a blood clot. Bil SCDs in place and heparin drip infusing as ordered charge nurse also aware of present issue and treatment ordered

## 2021-07-10 NOTE — Progress Notes (Signed)
PT Cancellation Note  Patient Details Name: Richard Spears MRN: [id number] DOB: August 22, 1925   Cancelled Treatment:    Reason Eval/Treat Not Completed: Other (comment). Eating a late lunch, try again as time and pt allow.   Ramond Dial 07/10/2021, 3:21 PM  Mee Hives, PT PhD Acute Rehab Dept. Number: [place] and [place]

## 2021-07-10 NOTE — Progress Notes (Signed)
PT Cancellation Note  Patient Details Name: Richard Spears MRN: [id number] DOB: 01/18/1926   Cancelled Treatment:    Reason Eval/Treat Not Completed: Patient at procedure or test/unavailable.  In Echo, will try again as time and pt allow.   Ramond Dial 07/10/2021, 12:23 PM  Mee Hives, PT PhD Acute Rehab Dept. Number: [place] and [place]

## 2021-07-10 NOTE — Progress Notes (Signed)
Echocardiogram 2D Echocardiogram has been performed.  Arlyss Gandy 07/10/2021, 12:17 PM

## 2021-07-10 NOTE — Progress Notes (Signed)
° ° °  Subjective:  No cardiac complaints  Developed cellulitis right arm   Objective:  Vitals:   07/09/21 2157 07/09/21 2200 07/09/21 2348 07/10/21 0328  BP: (!) 122/91 (!) 122/91 113/90 117/62  Pulse: 64  72 70  Resp: 17  17 18   Temp: 98.4 F (36.9 C)  98.1 F (36.7 C) 98.1 F (36.7 C)  TempSrc: Oral  Oral Oral  SpO2: 97%  97% 98%  Weight:      Height:        Intake/Output from previous day:  Intake/Output Summary (Last 24 hours) at 07/10/2021 0743 Last data filed at 07/10/2021 0557 Gross per 24 hour  Intake 359 ml  Output 1325 ml  Net -966 ml    Physical Exam: Thin white male S1 click no MR murmur Abdomen soft BS positive post colectomy  No edema Palpable pedal pulses  Erythema right forearm   Lab Results: Basic Metabolic Panel: Recent Labs    07/09/21 0123 07/10/21 0506  NA 131* 130*  K 4.0 4.2  CL 101 101  CO2 23 23  GLUCOSE 104* 86  BUN 30* 28*  CREATININE 1.20 1.28*  CALCIUM 7.8* 8.1*  MG 1.9 1.9   Liver Function Tests: Recent Labs    07/09/21 0123 07/10/21 0506  AST 26 35  ALT 18 23  ALKPHOS 44 45  BILITOT 0.5 0.6  PROT 5.0* 5.1*  ALBUMIN 2.0* 2.1*   No results for input(s): LIPASE, AMYLASE in the last 72 hours. CBC: Recent Labs    07/09/21 0123 07/09/21 1304 07/10/21 0506  WBC 4.4 4.3 3.8*  NEUTROABS 3.3  --  2.3  HGB 8.7* 9.8* 8.8*  HCT 25.3* 30.0* 26.3*  MCV 90.7 92.6 93.3  PLT 196 167 173    Imaging: No results found.  Cardiac Studies:  ECG: afib chronic    Telemetry:  afib   Echo: pending   Medications:    acetaminophen  1,000 mg Oral Q6H   Or   acetaminophen  650 mg Rectal Q6H   cephALEXin  500 mg Oral TID AC & HS   docusate sodium  100 mg Oral BID   feeding supplement  1 Container Oral TID BM   metoprolol tartrate  25 mg Oral BID   pantoprazole  40 mg Intravenous Q12H   sodium chloride flush  3 mL Intravenous Q12H      heparin 1,350 Units/hr (07/10/21 0732)     Assessment/Plan:   Afib:  chronic  rate control fine coumadin held for surgery and heparin held for post op rectal bleeding  and again for 36 hours now back on heparin  MVR:  88 yo St Jude mechanical valve normal valve clicks on exam TTE re ordered  GI bleed:  adenocarcinoma color post surgery Has received multiple transfusions Hct 26.3 follow closely on heparin  Cellulitis:  right forearm oral Keflex   He has an old mechanical MV and chronic afib and is high risk for CVA off anticoagulation for prolonged period of time   Cardiology will sign off call over weekend if needed   Jenkins Rouge 07/10/2021, 7:43 AM

## 2021-07-11 DIAGNOSIS — N179 Acute kidney failure, unspecified: Secondary | ICD-10-CM

## 2021-07-11 LAB — CBC
HCT: 25.9 % — ABNORMAL LOW (ref 39.0–52.0)
HCT: 27.1 % — ABNORMAL LOW (ref 39.0–52.0)
Hemoglobin: 8.2 g/dL — ABNORMAL LOW (ref 13.0–17.0)
Hemoglobin: 8.8 g/dL — ABNORMAL LOW (ref 13.0–17.0)
MCH: 30.3 pg (ref 26.0–34.0)
MCH: 30.4 pg (ref 26.0–34.0)
MCHC: 31.7 g/dL (ref 30.0–36.0)
MCHC: 32.5 g/dL (ref 30.0–36.0)
MCV: 95.6 fL (ref 80.0–100.0)
MCV: 93.8 fL (ref 80.0–100.0)
Platelets: 180 10*3/uL (ref 150–400)
Platelets: 206 10*3/uL (ref 150–400)
RBC: 2.71 MIL/uL — ABNORMAL LOW (ref 4.22–5.81)
RBC: 2.89 MIL/uL — ABNORMAL LOW (ref 4.22–5.81)
RDW: 19.3 % — ABNORMAL HIGH (ref 11.5–15.5)
RDW: 18.8 % — ABNORMAL HIGH (ref 11.5–15.5)
WBC: 4 10*3/uL (ref 4.0–10.5)
WBC: 3.5 10*3/uL — ABNORMAL LOW (ref 4.0–10.5)
nRBC: 0.5 % — ABNORMAL HIGH (ref 0.0–0.2)
nRBC: 0 % (ref 0.0–0.2)

## 2021-07-11 LAB — BASIC METABOLIC PANEL
Anion gap: 4 — ABNORMAL LOW (ref 5–15)
BUN: 29 mg/dL — ABNORMAL HIGH (ref 8–23)
CO2: 24 mmol/L (ref 22–32)
Calcium: 7.8 mg/dL — ABNORMAL LOW (ref 8.9–10.3)
Chloride: 103 mmol/L (ref 98–111)
Creatinine, Ser: 1.29 mg/dL — ABNORMAL HIGH (ref 0.61–1.24)
GFR, Estimated: 52 mL/min — ABNORMAL LOW (ref >60)
Glucose, Bld: 94 mg/dL (ref 70–99)
Potassium: 4.2 mmol/L (ref 3.5–5.1)
Sodium: 131 mmol/L — ABNORMAL LOW (ref 135–145)

## 2021-07-11 LAB — GLUCOSE, CAPILLARY: Glucose-Capillary: 90 mg/dL (ref 70–99)

## 2021-07-11 LAB — HEPARIN LEVEL (UNFRACTIONATED): Heparin Unfractionated: 0.41 IU/mL (ref 0.30–0.70)

## 2021-07-11 MED ORDER — POLYETHYLENE GLYCOL 3350 17 G PO PACK
34.0000 g | PACK | Freq: Once | ORAL | Status: AC
  Administered 2021-07-11: 34 g via ORAL
  Filled 2021-07-11: qty 2

## 2021-07-11 MED ORDER — ASCORBIC ACID 500 MG PO TABS
250.0000 mg | ORAL_TABLET | Freq: Two times a day (BID) | ORAL | Status: DC
  Administered 2021-07-11 – 2021-07-17 (×12): 250 mg via ORAL
  Filled 2021-07-11 (×12): qty 1

## 2021-07-11 MED ORDER — FERROUS SULFATE 325 (65 FE) MG PO TABS
325.0000 mg | ORAL_TABLET | Freq: Two times a day (BID) | ORAL | Status: DC
  Administered 2021-07-13 – 2021-07-17 (×9): 325 mg via ORAL
  Filled 2021-07-11 (×10): qty 1

## 2021-07-11 NOTE — Progress Notes (Signed)
PROGRESS NOTE                                                                                                                                                                                                             Patient Demographics:    Richard Spears, is a 88 y.o. 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  Currently on heparin drip for MVR.   Subjective:   No bowel movement for last couple days, patient denies any dizziness, lightheadedness or chest pain   Assessment  & Plan :    Acute lower GI bleed with colonoscopy done on 06/27/2021 showing large transverse colon mass with acute blood loss related anemia  - lower GI bleed- he received 1 unit of packed RBC transfusion earlier this admission, seen by GI and general surgery, he underwent colonoscopy and which was positive for colon mass biopsy suggestive of poorly differentiated adenocarcinoma, he was subsequently seen by general surgery and underwent surgical resection of the mass on 06/30/2021 by general surgery.   -Very tenuous situation, as he remains with recurrent acute blood loss anemia, likely from bleeding at anastomosis site while on anticoagulation. -Back on heparin drip, continue to monitor H&H closely  -I have stopped his oral iron as it might cause dark-colored stools the picture with GI bleed, may need IV iron  intermittently.  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   - Present on admission.  S/p colon resection see #1 above, bowel activity has not returned fully, requested to advance activity, will monitor closely on liquid diet per surgery.  CKD 3B.  Baseline creatinine around 1.5.  Monitor closely.  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  Resolved for now.  MVR.   - Coagulation on hold due  above, now he is  with evidence with recurrent bleed, I will go ahead and hold his heparin GTT, likely need to hold for another 1 to 2 days till his hemoglobin has stabilized and no further evidence of GI bleed.  -I have discussed with the patient and the son, there is risk involved either way, but at this point given his active bleed we will go ahead and hold his anticoagulation for next 24 to 48 hours.  Paroxysmal A. fib [place] vas 2 score of 3.  See above for anticoagulation.  on low-dose Lopressor and monitor.  Right arm cellulitis/acute superficial cephalic vein thrombosis -Started on Keflex, warm compress as well at it started at around IV site Ultrasound significant for superficial vein thrombosis        Condition - Fair  Family Communication  : [name] [phone] updated 07/08/2021   Code Status :  DNR  Consults  : General surgery, GI, Oncology,  PUD Prophylaxis :     Procedures  :     Colonoscopy done on 06/27/2021 showing large transverse colon mass.  Colon resection by general surgery on 06/30/2021      Disposition Plan  :    Status is: Inpatient  Remains inpatient appropriate because: Heparin drip, MVR, colon cancer   DVT Prophylaxis  : Heparin drip intermittently, SCD  Place and maintain sequential compression device Start: 07/09/21 1330 Place and maintain sequential compression device Start: 07/04/21 1358 SCDs Start: 06/24/21 2115     Lab Results  Component Value Date   PLT 180 07/11/2021    Diet :  Diet Order              Diet regular Room service appropriate? Yes; Fluid consistency: Thin  Diet effective now                    Inpatient Medications  Scheduled Meds:  acetaminophen  1,000 mg Oral Q6H   Or   acetaminophen  650 mg Rectal Q6H   vitamin C  250 mg Oral BID WC   cephALEXin  500 mg Oral TID AC & HS   docusate sodium  100 mg Oral BID   feeding supplement  1 Container Oral TID BM   ferrous sulfate  325 mg Oral BID WC   metoprolol tartrate  25 mg Oral BID   pantoprazole  40 mg Intravenous Q12H   polyethylene glycol  34 g Oral Once   sodium chloride flush  3 mL Intravenous Q12H   Continuous Infusions:  heparin 1,350 Units/hr (07/11/21 1216)    PRN Meds:.fentaNYL (SUBLIMAZE) injection, LORazepam, ondansetron (ZOFRAN) IV, oxyCODONE  Antibiotics  :    Anti-infectives (From admission, onward)    Start     Dose/Rate Route Frequency Ordered Stop   07/10/21 0630  cephALEXin (KEFLEX) capsule 500 mg        500 mg Oral 3 times daily before meals & bedtime 07/10/21 0619 07/15/21 0629         Emeline Gins Dagoberto Nealy M.D on 07/11/2021 at 1:56 PM  To page go to [email or web address]   [facility] -  Office  [phone]  See all Orders from today for further details    Objective:   Vitals:   07/11/21 0300 07/11/21 0337 07/11/21 0827 07/11/21 1142  BP:  (!) 120/53 (!) 155/66 114/70  Pulse: (!) 52 60 75 75  Resp:  18 17 15   Temp:  97.6 F (36.4 C) 97.8 F (36.6 C) 97.8 F (36.6 C)  TempSrc:  Oral Oral  Oral  SpO2: 98% 98% 100% 99%  Weight:      Height:        Wt Readings from Last 3 Encounters:  06/27/21 68 kg  04/28/21 68.1 kg  03/06/21 72 kg     Intake/Output Summary (Last 24 hours) at 07/11/2021 1356 Last data filed at 07/11/2021 0341 Gross per 24 hour  Intake 103.5 ml  Output 830 ml  Net -726.5 ml     Physical Exam   Awake Alert, Oriented X 3, laying in bed, thin appearing, frail Symmetrical Chest wall movement, Good air movement bilaterally, CTAB RRR,No  Gallops,Rubs, has mechanical click +ve B.Sounds, Abd Soft, No tenderness, No rebound - guarding or rigidity. No Cyanosis, Clubbing or edema, right arm cellulitis improving      Data Review:    CBC Recent Labs  Lab 07/07/21 0812 07/07/21 1440 07/08/21 0110 07/08/21 0617 07/09/21 0123 07/09/21 1304 07/10/21 0506 07/10/21 1345 07/11/21 0150  WBC 4.5 5.4 4.4   < > 4.4 4.3 3.8* 3.9* 4.0  HGB 8.3* 8.3* 7.0*   < > 8.7* 9.8* 8.8* 9.0* 8.2*  HCT 25.0* 25.4* 22.1*   < > 25.3* 30.0* 26.3* 27.9* 25.9*  PLT 162 179 136*   < > 196 167 173 181 180  MCV 94.0 93.7 95.7   < > 90.7 92.6 93.3 94.3 95.6  MCH 31.2 30.6 30.3   < > 31.2 30.2 31.2 30.4 30.3  MCHC 33.2 32.7 31.7   < > 34.4 32.7 33.5 32.3 31.7  RDW 19.5* 19.9* 19.6*   < > 18.8* 19.4* 18.8* 19.2* 19.3*  LYMPHSABS 0.6* 0.6* 0.7  --  0.5*  --  0.8  --   --   MONOABS 0.4 0.5 0.4  --  0.2  --  0.4  --   --   EOSABS 0.3 0.2 0.3  --  0.2  --  0.2  --   --   BASOSABS 0.0 0.0 0.0  --  0.0  --  0.0  --   --    < > = values in this interval not displayed.    Electrolytes Recent Labs  Lab 07/05/21 0053 07/06/21 0156 07/06/21 1216 07/07/21 0113 07/08/21 0110 07/09/21 0123 07/10/21 0506 07/11/21 0150  NA 138  --  134* 134* 132* 131* 130* 131*  K 4.2  --  4.0 4.1 4.7 4.0 4.2 4.2  CL 108  --  106 107 105 101 101 103  CO2 23  --  24 21* 21* 23 23 24   GLUCOSE 96  --  128* 96 94 104* 86 94  BUN 21  --  20 24* 31* 30* 28* 29*  CREATININE 1.16  --  1.42* 1.24 1.12 1.20 1.28* 1.29*  CALCIUM 8.1*  --  7.8* 7.7* 7.8* 7.8* 8.1* 7.8*  AST 17  --  20 19 22 26  35  --   ALT 11  --  13 14 14 18 23   --   ALKPHOS 37*  --  42 44 44 44 45  --   BILITOT 0.6  --  0.5 0.7 0.5 0.5 0.6  --   ALBUMIN 2.1*  --  2.1* 2.0* 1.9* 2.0* 2.1*  --   MG 1.8  --  1.9 1.9 1.8 1.9 1.9  --   INR 1.3* 1.4*  --  1.3* 1.3* 1.1  --   --   BNP 613.6*  --   --  530.9* 437.7* 451.0*  --   --      ------------------------------------------------------------------------------------------------------------------  No results for input(s): CHOL, HDL, LDLCALC, TRIG, CHOLHDL, LDLDIRECT in the last 72 hours.  No results found for: HGBA1C  No results for input(s): TSH, T4TOTAL, T3FREE, THYROIDAB in the last 72 hours.  Invalid input(s): FREET3 ------------------------------------------------------------------------------------------------------------------ ID Labs Recent Labs  Lab 07/07/21 0113 07/07/21 5784 07/08/21 0110 07/08/21 0617 07/09/21 0123 07/09/21 1304 07/10/21 0506 07/10/21 1345 07/11/21 0150  WBC 4.8   < > 4.4   < > 4.4 4.3 3.8* 3.9* 4.0  PLT 140*   < > 136*   < > 196 167 173 181 180  CREATININE 1.24  --  1.12  --  1.20  --  1.28*  --  1.29*   < > = values in this interval not displayed.   Cardiac Enzymes No results for input(s): CKMB, TROPONINI, MYOGLOBIN in the last 168 hours.  Invalid input(s): CK   Radiology Reports ECHOCARDIOGRAM COMPLETE  Result Date: 07/10/2021    ECHOCARDIOGRAM REPORT   Patient Name:   TERELLE DOBLER [facility] Date of Exam: 07/10/2021 Medical Rec #:  [id number]           Height:       72.0 in Accession #:    [id number]          Weight:       149.9 lb Date of Birth:  August 01, 1925           BSA:          1.885 m Patient Age:    55 years            BP:           142/78 mmHg Patient Gender: M                   HR:           96 bpm. Exam Location:  Inpatient Procedure: 2D Echo Indications:    Mitral valve replacement  History:        Patient has prior history of Echocardiogram examinations, most                 recent 01/18/2019. Aortic Valve Disease; Risk                 Factors:Dyslipidemia. Mitral valve replacement.  Sonographer:    Arlyss Gandy Referring Phys: [place] Comments: Patient supine IMPRESSIONS  1. Left ventricular ejection fraction, by estimation, is 55 to 60%. The left ventricle has normal function. The left  ventricle has no regional wall motion abnormalities. There is mild concentric left ventricular hypertrophy. Diastolic function is indeterminant due to Afib.  2. Right ventricular systolic function is mildly reduced. The right ventricular size is mildly enlarged. There is normal pulmonary artery systolic pressure. The estimated right ventricular systolic pressure is 25.3 mmHg.  3. Left atrial size was severely dilated.  4. Right atrial size was severely dilated.  5. The mitral valve has been repaired/replaced. Patient is s/p St Jude bileaflet mechanical valve replacement. Echo findings are consistent with normal structure and function of the mitral valve prosthesis. Mean gradient is 75mmHg at 99bpm.  6. The aortic valve is tricuspid. There is mild calcification of the aortic valve. There is mild thickening of the aortic valve. Aortic valve regurgitation is moderate.  7. Aortic dilatation noted. There is borderline dilatation of the aortic root, measuring 37 mm.  8. The inferior vena cava is normal in size with <50% respiratory variability, suggesting right atrial pressure of 8 mmHg.  Comparison(s): Compared to prior TTE in 2020, the AR now appears moderate. Otherwise no significant change. Mean mitral valve gradient stable at ~5-4mmHg. FINDINGS  Left Ventricle: Left ventricular ejection fraction, by estimation, is 55 to 60%. The left ventricle has normal function. The left ventricle has no regional wall motion abnormalities. The left ventricular internal cavity size was normal in size. There is  mild concentric left ventricular hypertrophy. Abnormal (paradoxical) septal motion consistent with post-operative status. Diastolic function is indeterminant due to Afib. Right Ventricle: The right ventricular size is mildly enlarged. No increase in right ventricular wall thickness. Right ventricular systolic function is mildly reduced. There is normal pulmonary artery systolic pressure. The tricuspid regurgitant velocity  is  2.76 m/s, and with an assumed right atrial pressure of 3 mmHg, the estimated right ventricular systolic pressure is 91.4 mmHg. Left Atrium: Left atrial size was severely dilated. Right Atrium: Right atrial size was severely dilated. Pericardium: There is no evidence of pericardial effusion. Mitral Valve: Mean gradient 86mmHg at HR 99bpm. The mitral valve has been repaired/replaced. There is a St. Jude present in the mitral position. Echo findings are consistent with normal structure and function of the mitral valve prosthesis. MV peak gradient, 14.4 mmHg. The mean mitral valve gradient is 6.0 mmHg. Tricuspid Valve: The tricuspid valve is normal in structure. Tricuspid valve regurgitation is mild. Aortic Valve: The aortic valve is tricuspid. There is mild calcification of the aortic valve. There is mild thickening of the aortic valve. Aortic valve regurgitation is moderate. Aortic regurgitation PHT measures 364 msec. Aortic valve mean gradient measures 5.5 mmHg. Aortic valve peak gradient measures 10.1 mmHg. Aortic valve area, by VTI measures 1.94 cm. Pulmonic Valve: The pulmonic valve was normal in structure. Pulmonic valve regurgitation is trivial. Aorta: Aortic dilatation noted. There is borderline dilatation of the aortic root, measuring 37 mm. Venous: The inferior vena cava is normal in size with less than 50% respiratory variability, suggesting right atrial pressure of 8 mmHg. IAS/Shunts: The atrial septum is grossly normal.  LEFT VENTRICLE PLAX 2D LVIDd:         4.20 cm   Diastology LVIDs:         3.10 cm   LV e' medial:    9.30 cm/s LV PW:         1.30 cm   LV E/e' medial:  14.7 LV IVS:        1.30 cm   LV e' lateral:   12.80 cm/s LVOT diam:     2.10 cm   LV E/e' lateral: 10.7 LV SV:         53 LV SV Index:   28 LVOT Area:     3.46 cm  RIGHT VENTRICLE             IVC RV Basal diam:  4.10 cm     IVC diam: 1.80 cm RV S prime:     10.70 cm/s TAPSE (M-mode): 1.6 cm LEFT ATRIUM              Index        RIGHT  ATRIUM           Index LA diam:        5.00 cm  2.65 cm/m   RA Area:     29.70 cm LA Vol (A2C):   102.0 ml 54.11 ml/m  RA Volume:   98.90 ml  52.47 ml/m LA Vol (A4C):   103.0 ml 54.64 ml/m LA Biplane Vol: 104.0  ml 55.18 ml/m  AORTIC VALVE AV Area (Vmax):    1.98 cm AV Area (Vmean):   1.95 cm AV Area (VTI):     1.94 cm AV Vmax:           159.00 cm/s AV Vmean:          108.350 cm/s AV VTI:            0.274 m AV Peak Grad:      10.1 mmHg AV Mean Grad:      5.5 mmHg LVOT Vmax:         90.95 cm/s LVOT Vmean:        61.150 cm/s LVOT VTI:          0.154 m LVOT/AV VTI ratio: 0.56 AI PHT:            364 msec  AORTA Ao Root diam: 3.70 cm Ao Asc diam:  3.10 cm MITRAL VALVE                TRICUSPID VALVE MV Area (PHT): 2.87 cm     TR Peak grad:   30.5 mmHg MV Area VTI:   1.60 cm     TR Vmax:        276.00 cm/s MV Peak grad:  14.4 mmHg MV Mean grad:  6.0 mmHg     SHUNTS MV Vmax:       1.90 m/s     Systemic VTI:  0.15 m MV Vmean:      112.0 cm/s   Systemic Diam: 2.10 cm MV Decel Time: 264 msec MV E velocity: 137.00 cm/s MV A velocity: 0.00 cm/s Gwyndolyn Kaufman MD Electronically signed by Gwyndolyn Kaufman MD Signature Date/Time: 07/10/2021/3:10:59 PM    Final    VAS [place] UPPER EXTREMITY VENOUS DUPLEX  Result Date: 07/10/2021 UPPER VENOUS STUDY  Patient Name:  ISAAK DELMUNDO  Date of Exam:   07/10/2021 Medical Rec #: [id number]            Accession #:    [id number] Date of Birth: 07/25/1925            Patient Gender: M Patient Age:   56 years Exam Location:  [facility] Procedure:      VAS [place] UPPER EXTREMITY VENOUS DUPLEX Referring Phys: Inda Merlin --------------------------------------------------------------------------------  Indications: Pain, Swelling, and Erythema Comparison Study: No prior study Performing Technologist: Maudry Mayhew MHA, RDMS, RVT, RDCS  Examination Guidelines: A complete evaluation includes B-mode imaging, spectral Doppler, color Doppler, and power Doppler as needed of  all accessible portions of each vessel. Bilateral testing is considered an integral part of a complete examination. Limited examinations for reoccurring indications may be performed as noted.  Right Findings: +----------+------------+---------+-----------+----------+--------------------+  RIGHT      Compressible Phasicity Spontaneous Properties       Summary         +----------+------------+---------+-----------+----------+--------------------+  IJV            Full        Yes        Yes                                      +----------+------------+---------+-----------+----------+--------------------+  Subclavian     Full        Yes        Yes                                      +----------+------------+---------+-----------+----------+--------------------+  Axillary       Full        Yes        Yes                                      +----------+------------+---------+-----------+----------+--------------------+  Brachial       Full        Yes        Yes                                      +----------+------------+---------+-----------+----------+--------------------+  Radial         Full                                                            +----------+------------+---------+-----------+----------+--------------------+  Ulnar          Full                                                            +----------+------------+---------+-----------+----------+--------------------+  Cephalic       None                                       Acute, mid forearm                                                                     only          +----------+------------+---------+-----------+----------+--------------------+  Basilic        Full                                                            +----------+------------+---------+-----------+----------+--------------------+  Left Findings: +----------+------------+---------+-----------+----------+-------+  LEFT        Compressible Phasicity Spontaneous Properties Summary  +----------+------------+---------+-----------+----------+-------+  Subclavian                 Yes        Yes                         +----------+------------+---------+-----------+----------+-------+  Summary:  Right: No evidence of deep vein thrombosis in the upper extremity. Findings consistent with acute superficial vein thrombosis involving the right cephalic vein.  Left: No evidence of thrombosis in the subclavian.  *See table(s) above for measurements and observations.  Diagnosing physician: Jamelle Haring Electronically signed by Jamelle Haring on 07/10/2021 at 2:49:56 PM.  Final      Patient ID: [name], male   DOB: 07/08/1925, 88 y.o.   MRN: [id number] Patient ID: DINESH ULYSSE, male   DOB: Jun 24, 1926, 88 y.o.   MRN: [id number] Patient ID: MEREDITH MELLS, male   DOB: 1926-01-25, 88 y.o.   MRN: [id number] Patient ID: DEAKEN JURGENS, male   DOB: May 17, 1926, 88 y.o.   MRN: [id number]

## 2021-07-11 NOTE — Progress Notes (Signed)
ANTICOAGULATION CONSULT NOTE - Follow Up Consult  Pharmacy Consult for Heparin Indication:  mechanical mitral valve and atrial fibrillation  Allergies  Allergen Reactions   Flexeril [Cyclobenzaprine] Diarrhea    Patient Measurements: Height: 6' (182.9 cm) Weight: 68 kg (149 lb 14.6 oz) IBW/kg (Calculated) : 77.6 Heparin Dosing Weight: 68 kg  Vital Signs: Temp: 97.6 F (36.4 C) (01/14 0337) Temp Source: Oral (01/14 0337) BP: 120/53 (01/14 0337) Pulse Rate: 60 (01/14 0337)  Labs: Recent Labs    07/09/21 0123 07/09/21 1304 07/10/21 0506 07/10/21 1345 07/11/21 0150  HGB 8.7*   < > 8.8* 9.0* 8.2*  HCT 25.3*   < > 26.3* 27.9* 25.9*  PLT 196   < > 173 181 180  LABPROT 14.1  --   --   --   --   INR 1.1  --   --   --   --   HEPARINUNFRC  --   --  0.23* 0.45 0.41  CREATININE 1.20  --  1.28*  --  1.29*   < > = values in this interval not displayed.     Estimated Creatinine Clearance: 34.4 mL/min (A) (by C-G formula based on SCr of 1.29 mg/dL (H)).  Assessment: 88 yo M admitted with GIB on 12/28. PTA warfarin (LD 12/28 @ 0730) for mechanical mitral valve and atrial fibrillation. INR 3.9 on admission > IV vitamin k 5mg  12/29 > INR 1.2 on recheck. Pharmacy consulted to start IV heparin while warfarin is on hold.   Heparin IV infusion 1350 units/hr Running appropriately in room.  Hgb 9 > 8.2 with AM labs, but no bleeding noted per patient or nurse.   Heparin level 0.41, therapeutic   Goal of Therapy:  Heparin level 0.3-0.5 units/ml Monitor platelets by anticoagulation protocol: Yes   Plan:  Continue heparin at 1350 units/ hr Daily heparin level, CBC Watch Hgb F/u plans for warfarin  Thank you for allowing pharmacy to participate in this patient's care.  Levonne Spiller, PharmD PGY1 Acute Care Resident  07/11/2021,7:27 AM

## 2021-07-11 NOTE — Progress Notes (Signed)
Patient ID: Richard Spears, male   DOB: February 13, 1926, 88 y.o.   MRN: [id number] [facility] Progress Note  11 Days Post-Op  Subjective: CC-  No complaints.  Eating well.  No further bloody BMs since his last one now ~3 days ago.  Heparin restarted 1/12 @ 2000.  Objective: Vital signs in last 24 hours: Temp:  [97.6 F (36.4 C)-97.8 F (36.6 C)] 97.8 F (36.6 C) (01/14 0827) Pulse Rate:  [52-90] 75 (01/14 0827) Resp:  [16-18] 17 (01/14 0827) BP: (104-155)/(45-95) 155/66 (01/14 0827) SpO2:  [94 %-100 %] 100 % (01/14 0827) Last BM Date: 07/10/21  Intake/Output from previous day: 01/13 0701 - 01/14 0700 In: 229.1 [I.V.:229.1] Out: 1180 [Urine:1180] Intake/Output this shift: No intake/output data recorded.  PE: Gen:  Alert, NAD, pleasant Pulm: rate and effort normal Abd: Soft, ND, nontender, midline incision cdi with staples present and no erythema or drainage  Lab Results:  Recent Labs    07/10/21 1345 07/11/21 0150  WBC 3.9* 4.0  HGB 9.0* 8.2*  HCT 27.9* 25.9*  PLT 181 180   BMET Recent Labs    07/10/21 0506 07/11/21 0150  NA 130* 131*  K 4.2 4.2  CL 101 103  CO2 23 24  GLUCOSE 86 94  BUN 28* 29*  CREATININE 1.28* 1.29*  CALCIUM 8.1* 7.8*   PT/INR Recent Labs    07/09/21 0123  LABPROT 14.1  INR 1.1   CMP     Component Value Date/Time   NA 131 (L) 07/11/2021 0150   NA 134 (A) 10/23/2020 1035   NA 130 10/20/2017 0000   K 4.2 07/11/2021 0150   K 4.2 10/20/2017 0000   CL 103 07/11/2021 0150   CO2 24 07/11/2021 0150   GLUCOSE 94 07/11/2021 0150   BUN 29 (H) 07/11/2021 0150   BUN 33 (A) 10/23/2020 1035   CREATININE 1.29 (H) 07/11/2021 0150   CREATININE 1.38 10/20/2017 0000   CALCIUM 7.8 (L) 07/11/2021 0150   CALCIUM 8.6 10/20/2017 0000   PROT 5.1 (L) 07/10/2021 0506   ALBUMIN 2.1 (L) 07/10/2021 0506   AST 35 07/10/2021 0506   ALT 23 07/10/2021 0506   ALKPHOS 45 07/10/2021 0506   BILITOT 0.6 07/10/2021 0506   GFRNONAA 52 (L)  07/11/2021 0150   GFRAA 61 10/23/2020 1035   Lipase     Component Value Date/Time   LIPASE 34 06/24/2021 1220       Studies/Results: ECHOCARDIOGRAM COMPLETE  Result Date: 07/10/2021    ECHOCARDIOGRAM REPORT   Patient Name:   Richard Spears [facility] Date of Exam: 07/10/2021 Medical Rec #:  [id number]           Height:       72.0 in Accession #:    [id number]          Weight:       149.9 lb Date of Birth:  04-29-1926           BSA:          1.885 m Patient Age:    22 years            BP:           142/78 mmHg Patient Gender: M                   HR:           96 bpm. Exam Location:  Inpatient Procedure: 2D Echo Indications:    Mitral valve replacement  History:        Patient has prior history of Echocardiogram examinations, most                 recent 01/18/2019. Aortic Valve Disease; Risk                 Factors:Dyslipidemia. Mitral valve replacement.  Sonographer:    Arlyss Gandy Referring Phys: [place] Comments: Patient supine IMPRESSIONS  1. Left ventricular ejection fraction, by estimation, is 55 to 60%. The left ventricle has normal function. The left ventricle has no regional wall motion abnormalities. There is mild concentric left ventricular hypertrophy. Diastolic function is indeterminant due to Afib.  2. Right ventricular systolic function is mildly reduced. The right ventricular size is mildly enlarged. There is normal pulmonary artery systolic pressure. The estimated right ventricular systolic pressure is 62.7 mmHg.  3. Left atrial size was severely dilated.  4. Right atrial size was severely dilated.  5. The mitral valve has been repaired/replaced. Patient is s/p St Jude bileaflet mechanical valve replacement. Echo findings are consistent with normal structure and function of the mitral valve prosthesis. Mean gradient is 68mmHg at 99bpm.  6. The aortic valve is tricuspid. There is mild calcification of the aortic valve. There is mild thickening of the aortic valve. Aortic  valve regurgitation is moderate.  7. Aortic dilatation noted. There is borderline dilatation of the aortic root, measuring 37 mm.  8. The inferior vena cava is normal in size with <50% respiratory variability, suggesting right atrial pressure of 8 mmHg. Comparison(s): Compared to prior TTE in 2020, the AR now appears moderate. Otherwise no significant change. Mean mitral valve gradient stable at ~5-24mmHg. FINDINGS  Left Ventricle: Left ventricular ejection fraction, by estimation, is 55 to 60%. The left ventricle has normal function. The left ventricle has no regional wall motion abnormalities. The left ventricular internal cavity size was normal in size. There is  mild concentric left ventricular hypertrophy. Abnormal (paradoxical) septal motion consistent with post-operative status. Diastolic function is indeterminant due to Afib. Right Ventricle: The right ventricular size is mildly enlarged. No increase in right ventricular wall thickness. Right ventricular systolic function is mildly reduced. There is normal pulmonary artery systolic pressure. The tricuspid regurgitant velocity  is 2.76 m/s, and with an assumed right atrial pressure of 3 mmHg, the estimated right ventricular systolic pressure is 03.5 mmHg. Left Atrium: Left atrial size was severely dilated. Right Atrium: Right atrial size was severely dilated. Pericardium: There is no evidence of pericardial effusion. Mitral Valve: Mean gradient 60mmHg at HR 99bpm. The mitral valve has been repaired/replaced. There is a St. Jude present in the mitral position. Echo findings are consistent with normal structure and function of the mitral valve prosthesis. MV peak gradient, 14.4 mmHg. The mean mitral valve gradient is 6.0 mmHg. Tricuspid Valve: The tricuspid valve is normal in structure. Tricuspid valve regurgitation is mild. Aortic Valve: The aortic valve is tricuspid. There is mild calcification of the aortic valve. There is mild thickening of the aortic valve.  Aortic valve regurgitation is moderate. Aortic regurgitation PHT measures 364 msec. Aortic valve mean gradient measures 5.5 mmHg. Aortic valve peak gradient measures 10.1 mmHg. Aortic valve area, by VTI measures 1.94 cm. Pulmonic Valve: The pulmonic valve was normal in structure. Pulmonic valve regurgitation is trivial. Aorta: Aortic dilatation noted. There is borderline dilatation of the aortic root, measuring 37 mm. Venous: The inferior vena cava is normal in size with less than 50% respiratory  variability, suggesting right atrial pressure of 8 mmHg. IAS/Shunts: The atrial septum is grossly normal.  LEFT VENTRICLE PLAX 2D LVIDd:         4.20 cm   Diastology LVIDs:         3.10 cm   LV e' medial:    9.30 cm/s LV PW:         1.30 cm   LV E/e' medial:  14.7 LV IVS:        1.30 cm   LV e' lateral:   12.80 cm/s LVOT diam:     2.10 cm   LV E/e' lateral: 10.7 LV SV:         53 LV SV Index:   28 LVOT Area:     3.46 cm  RIGHT VENTRICLE             IVC RV Basal diam:  4.10 cm     IVC diam: 1.80 cm RV S prime:     10.70 cm/s TAPSE (M-mode): 1.6 cm LEFT ATRIUM              Index        RIGHT ATRIUM           Index LA diam:        5.00 cm  2.65 cm/m   RA Area:     29.70 cm LA Vol (A2C):   102.0 ml 54.11 ml/m  RA Volume:   98.90 ml  52.47 ml/m LA Vol (A4C):   103.0 ml 54.64 ml/m LA Biplane Vol: 104.0 ml 55.18 ml/m  AORTIC VALVE AV Area (Vmax):    1.98 cm AV Area (Vmean):   1.95 cm AV Area (VTI):     1.94 cm AV Vmax:           159.00 cm/s AV Vmean:          108.350 cm/s AV VTI:            0.274 m AV Peak Grad:      10.1 mmHg AV Mean Grad:      5.5 mmHg LVOT Vmax:         90.95 cm/s LVOT Vmean:        61.150 cm/s LVOT VTI:          0.154 m LVOT/AV VTI ratio: 0.56 AI PHT:            364 msec  AORTA Ao Root diam: 3.70 cm Ao Asc diam:  3.10 cm MITRAL VALVE                TRICUSPID VALVE MV Area (PHT): 2.87 cm     TR Peak grad:   30.5 mmHg MV Area VTI:   1.60 cm     TR Vmax:        276.00 cm/s MV Peak grad:  14.4 mmHg MV  Mean grad:  6.0 mmHg     SHUNTS MV Vmax:       1.90 m/s     Systemic VTI:  0.15 m MV Vmean:      112.0 cm/s   Systemic Diam: 2.10 cm MV Decel Time: 264 msec MV E velocity: 137.00 cm/s MV A velocity: 0.00 cm/s Gwyndolyn Kaufman MD Electronically signed by Gwyndolyn Kaufman MD Signature Date/Time: 07/10/2021/3:10:59 PM    Final    VAS [place] UPPER EXTREMITY VENOUS DUPLEX  Result Date: 07/10/2021 UPPER VENOUS STUDY  Patient Name:  Richard Spears  Date of Exam:   07/10/2021 Medical  Rec #: [id number]            Accession #:    [id number] Date of Birth: [birth year]            Patient Gender: M Patient Age:   52 years Exam Location:  [facility] Procedure:      VAS [place] UPPER EXTREMITY VENOUS DUPLEX Referring Phys: Inda Merlin --------------------------------------------------------------------------------  Indications: Pain, Swelling, and Erythema Comparison Study: No prior study Performing Technologist: Maudry Mayhew MHA, RDMS, RVT, RDCS  Examination Guidelines: A complete evaluation includes B-mode imaging, spectral Doppler, color Doppler, and power Doppler as needed of all accessible portions of each vessel. Bilateral testing is considered an integral part of a complete examination. Limited examinations for reoccurring indications may be performed as noted.  Right Findings: +----------+------------+---------+-----------+----------+--------------------+  RIGHT      Compressible Phasicity Spontaneous Properties       Summary         +----------+------------+---------+-----------+----------+--------------------+  IJV            Full        Yes        Yes                                      +----------+------------+---------+-----------+----------+--------------------+  Subclavian     Full        Yes        Yes                                      +----------+------------+---------+-----------+----------+--------------------+  Axillary       Full        Yes        Yes                                       +----------+------------+---------+-----------+----------+--------------------+  Brachial       Full        Yes        Yes                                      +----------+------------+---------+-----------+----------+--------------------+  Radial         Full                                                            +----------+------------+---------+-----------+----------+--------------------+  Ulnar          Full                                                            +----------+------------+---------+-----------+----------+--------------------+  Cephalic       None  Acute, mid forearm                                                                     only          +----------+------------+---------+-----------+----------+--------------------+  Basilic        Full                                                            +----------+------------+---------+-----------+----------+--------------------+  Left Findings: +----------+------------+---------+-----------+----------+-------+  LEFT       Compressible Phasicity Spontaneous Properties Summary  +----------+------------+---------+-----------+----------+-------+  Subclavian                 Yes        Yes                         +----------+------------+---------+-----------+----------+-------+  Summary:  Right: No evidence of deep vein thrombosis in the upper extremity. Findings consistent with acute superficial vein thrombosis involving the right cephalic vein.  Left: No evidence of thrombosis in the subclavian.  *See table(s) above for measurements and observations.  Diagnosing physician: Jamelle Haring Electronically signed by Jamelle Haring on 07/10/2021 at 2:49:56 PM.    Final     Anti-infectives: Anti-infectives (From admission, onward)    Start     Dose/Rate Route Frequency Ordered Stop   07/10/21 0630  cephALEXin (KEFLEX) capsule 500 mg        500 mg Oral 3 times daily before meals & bedtime 07/10/21 0923  07/15/21 3007        Assessment/Plan OR 1/3 (Dr. 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 - likely intermittent oozing from anastomosis due to anticoagulation requirement. Abdominal exam is benign. Received 2 units PRBCs 07/04/21 and again 1/11.  -Hgb 8.2<--9.8<--8.8.  no further bleeding or bloody BMs since 1/12. Repeat hgb tomorrow am if no further bleeding. On iron, stools will be black. -cont to closely monitor since reinitiation of heparin.   FEN - reg diet VTE - heparin restarted 1/12 ID - none currently Foley - out 1/5 and voiding   A fib MVR on coumadin CKD   LOS: 24 days   Nadeen Landau, MD [facility], [place]

## 2021-07-12 LAB — CBC
HCT: 26 % — ABNORMAL LOW (ref 39.0–52.0)
Hemoglobin: 8.7 g/dL — ABNORMAL LOW (ref 13.0–17.0)
MCH: 31.8 pg (ref 26.0–34.0)
MCHC: 33.5 g/dL (ref 30.0–36.0)
MCV: 94.9 fL (ref 80.0–100.0)
Platelets: 229 10*3/uL (ref 150–400)
RBC: 2.74 MIL/uL — ABNORMAL LOW (ref 4.22–5.81)
RDW: 19 % — ABNORMAL HIGH (ref 11.5–15.5)
WBC: 4.2 10*3/uL (ref 4.0–10.5)
nRBC: 0 % (ref 0.0–0.2)

## 2021-07-12 LAB — HEPARIN LEVEL (UNFRACTIONATED): Heparin Unfractionated: 0.55 IU/mL (ref 0.30–0.70)

## 2021-07-12 MED ORDER — WARFARIN SODIUM 7.5 MG PO TABS
7.5000 mg | ORAL_TABLET | Freq: Once | ORAL | Status: AC
  Administered 2021-07-12: 7.5 mg via ORAL
  Filled 2021-07-12: qty 1

## 2021-07-12 MED ORDER — WARFARIN - PHARMACIST DOSING INPATIENT: Freq: Every day | Status: DC

## 2021-07-12 MED ORDER — POLYETHYLENE GLYCOL 3350 17 G PO PACK
34.0000 g | PACK | Freq: Every day | ORAL | Status: DC
  Administered 2021-07-12 – 2021-07-16 (×5): 34 g via ORAL
  Filled 2021-07-12 (×6): qty 2

## 2021-07-12 NOTE — Progress Notes (Signed)
ANTICOAGULATION CONSULT NOTE - Follow Up Consult  Pharmacy Consult for Heparin Indication:  mechanical mitral valve and atrial fibrillation  Allergies  Allergen Reactions   Flexeril [Cyclobenzaprine] Diarrhea    Patient Measurements: Height: 6' (182.9 cm) Weight: 68 kg (149 lb 14.6 oz) IBW/kg (Calculated) : 77.6 Heparin Dosing Weight: 68 kg  Vital Signs: Temp: 98.3 F (36.8 C) (01/15 0318) Temp Source: Oral (01/15 0318) BP: 120/39 (01/15 0318) Pulse Rate: 62 (01/15 0318)  Labs: Recent Labs    07/10/21 0506 07/10/21 1345 07/11/21 0150 07/11/21 1427 07/12/21 0059  HGB 8.8* 9.0* 8.2* 8.8* 8.7*  HCT 26.3* 27.9* 25.9* 27.1* 26.0*  PLT 173 181 180 206 229  HEPARINUNFRC 0.23* 0.45 0.41  --  0.55  CREATININE 1.28*  --  1.29*  --   --      Estimated Creatinine Clearance: 34.4 mL/min (A) (by C-G formula based on SCr of 1.29 mg/dL (H)).  Assessment: 88 yo M admitted with GIB on 12/28. PTA warfarin (LD 12/28 @ 0730) for mechanical mitral valve and atrial fibrillation. INR 3.9 on admission > IV vitamin k 5mg  12/29 > INR 1.2 on recheck. Pharmacy consulted to start IV heparin while warfarin is on hold.   Heparin IV infusion 1350 units/hr Running appropriately in room. Discussed with MD, will restart warfarin 1/15.   Heparin level 0.55, slightly supra-therapeutic for reduced goal  Goal of Therapy:  Heparin level 0.3-0.5 units/ml Monitor platelets by anticoagulation protocol: Yes   Plan:  Decrease heparin at 1300 units/hr Warfarin 7.5mg  x 1 Daily heparin level and CBC  Thank you for allowing pharmacy to participate in this patient's care.  Levonne Spiller, PharmD PGY1 Acute Care Resident  07/12/2021,7:16 AM

## 2021-07-12 NOTE — Plan of Care (Signed)
  Problem: Health Behavior/Discharge Planning: Goal: Ability to manage health-related needs will improve Outcome: Progressing   Problem: Clinical Measurements: Goal: Ability to maintain clinical measurements within normal limits will improve Outcome: Progressing   

## 2021-07-12 NOTE — Plan of Care (Signed)
°  Problem: Health Behavior/Discharge Planning: Goal: Ability to manage health-related needs will improve 07/12/2021 1446 by Emmaline Life, RN Outcome: Progressing 07/12/2021 1446 by Emmaline Life, RN Outcome: Progressing   Problem: Clinical Measurements: Goal: Ability to maintain clinical measurements within normal limits will improve 07/12/2021 1446 by Emmaline Life, RN Outcome: Progressing 07/12/2021 1446 by Emmaline Life, RN Outcome: Progressing

## 2021-07-12 NOTE — Progress Notes (Signed)
PROGRESS NOTE                                                                                                                                                                                                             Patient Demographics:    Richard Spears, is a 88 y.o. 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  Currently on heparin drip for MVR.   Subjective:   Patient with no bowel movements over the last few days, he denies any dizziness, lightheadedness, shortness of breath or chest pain.      Assessment  & Plan :    Acute lower GI bleed with colonoscopy done on 06/27/2021 showing large transverse colon mass with acute blood loss related anemia  - lower GI bleed- he received 1 unit of packed RBC transfusion earlier this admission, seen by GI and general surgery, he underwent colonoscopy and which was positive for colon mass biopsy suggestive of poorly differentiated adenocarcinoma, he was subsequently seen by general surgery and underwent surgical resection of the mass on 06/30/2021 by general surgery.   -Very tenuous situation, as he remains with recurrent acute blood loss anemia, likely from bleeding at anastomosis site while on anticoagulation. -Patient back on heparin drip for last 3 days, H&H remained stable, no evidence of GI bleed, no BM for last few days, will start on warfarin, will  add some more laxatives as well.  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   - Present on admission.  S/p colon resection .,  Advance to regular diet currently, tolerating very well.   CKD 3B.  Baseline creatinine around 1.5.  Monitor closely.  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  Resolved for now.  MVR.   - Coagulation on hold due  above, now  he is with evidence with recurrent bleed, I will go ahead and hold his heparin GTT, likely need to hold for another 1 to 2 days till his hemoglobin has stabilized and no further evidence of GI bleed.  -I have discussed with the patient and the son, there is risk involved either way, but at this point given his active bleed we will go ahead and hold his anticoagulation for next 24 to 48 hours.  Paroxysmal A. fib [place] vas 2 score of 3.  See above for anticoagulation.  on low-dose Lopressor and monitor.  Right arm cellulitis/acute superficial cephalic vein thrombosis -Started on Keflex, warm compress as well at it started at around IV site Ultrasound significant for superficial vein thrombosis        Condition - Fair  Family Communication  : Richard Spears [phone] updated 07/08/2021   Code Status :  DNR  Consults  : General surgery, GI, Oncology,  PUD Prophylaxis :     Procedures  :     Colonoscopy done on 06/27/2021 showing large transverse colon mass.  Colon resection by general surgery on 06/30/2021      Disposition Plan  :    Status is: Inpatient  Remains inpatient appropriate because: Heparin drip, MVR, colon cancer   DVT Prophylaxis  : Heparin drip intermittently, SCD  Place and maintain sequential compression device Start: 07/09/21 1330 Place and maintain sequential compression device Start: 07/04/21 1358 SCDs Start: 06/24/21 2115 warfarin (COUMADIN) tablet 7.5 mg     Lab Results  Component Value Date   PLT 229 07/12/2021    Diet :  Diet Order             Diet regular Room  service appropriate? Yes; Fluid consistency: Thin  Diet effective now                    Inpatient Medications  Scheduled Meds:  acetaminophen  1,000 mg Oral Q6H   Or   acetaminophen  650 mg Rectal Q6H   vitamin C  250 mg Oral BID WC   cephALEXin  500 mg Oral TID AC & HS   docusate sodium  100 mg Oral BID   feeding supplement  1 Container Oral TID BM   ferrous sulfate  325 mg Oral BID WC   metoprolol tartrate  25 mg Oral BID   pantoprazole  40 mg Intravenous Q12H   polyethylene glycol  34 g Oral Daily   sodium chloride flush  3 mL Intravenous Q12H   warfarin  7.5 mg Oral ONCE-1600   Warfarin - Pharmacist Dosing Inpatient   Does not apply q1600   Continuous Infusions:  heparin 1,300 Units/hr (07/12/21 0848)    PRN Meds:.fentaNYL (SUBLIMAZE) injection, LORazepam, ondansetron (ZOFRAN) IV, oxyCODONE  Antibiotics  :    Anti-infectives (From admission, onward)    Start     Dose/Rate Route Frequency Ordered Stop   07/10/21 0630  cephALEXin (KEFLEX) capsule 500 mg        500 mg Oral 3 times daily before meals & bedtime 07/10/21 0619 07/15/21 0629         Richard Spears M.D on 07/12/2021 at 1:08 PM  To page go to [email or web address]   [facility] -  Office  [phone]  See all Orders from today for further details    Objective:   Vitals:   07/12/21 0300 07/12/21 0318 07/12/21 0907 07/12/21 1151  BP:  (!) 120/39 (!) 178/88 (!) 121/42  Pulse: 71 62  87 68  Resp:  17 18 16   Temp:  98.3 F (36.8 C)  98.4 F (36.9 C)  TempSrc:  Oral  Oral  SpO2: 95% 99% 94% 97%  Weight:      Height:        Wt Readings from Last 3 Encounters:  06/27/21 68 kg  04/28/21 68.1 kg  03/06/21 72 kg     Intake/Output Summary (Last 24 hours) at 07/12/2021 1308 Last data filed at 07/12/2021 0850 Gross per 24 hour  Intake 566.44 ml  Output 1360 ml  Net -793.56 ml     Physical Exam   Awake Alert, Oriented X 3, frail, thin appearing. Symmetrical Chest wall movement,  Good air movement bilaterally, CTAB RRR,No Gallops,Rubs, mechanical click +ve B.Sounds, Abd Soft, No tenderness, No rebound - guarding or rigidity. No Cyanosis, Clubbing or edema, right arm cellulitis significantly subsided     Data Review:    CBC Recent Labs  Lab 07/07/21 0812 07/07/21 1440 07/08/21 0110 07/08/21 0617 07/09/21 0123 07/09/21 1304 07/10/21 0506 07/10/21 1345 07/11/21 0150 07/11/21 1427 07/12/21 0059  WBC 4.5 5.4 4.4   < > 4.4   < > 3.8* 3.9* 4.0 3.5* 4.2  HGB 8.3* 8.3* 7.0*   < > 8.7*   < > 8.8* 9.0* 8.2* 8.8* 8.7*  HCT 25.0* 25.4* 22.1*   < > 25.3*   < > 26.3* 27.9* 25.9* 27.1* 26.0*  PLT 162 179 136*   < > 196   < > 173 181 180 206 229  MCV 94.0 93.7 95.7   < > 90.7   < > 93.3 94.3 95.6 93.8 94.9  MCH 31.2 30.6 30.3   < > 31.2   < > 31.2 30.4 30.3 30.4 31.8  MCHC 33.2 32.7 31.7   < > 34.4   < > 33.5 32.3 31.7 32.5 33.5  RDW 19.5* 19.9* 19.6*   < > 18.8*   < > 18.8* 19.2* 19.3* 18.8* 19.0*  LYMPHSABS 0.6* 0.6* 0.7  --  0.5*  --  0.8  --   --   --   --   MONOABS 0.4 0.5 0.4  --  0.2  --  0.4  --   --   --   --   EOSABS 0.3 0.2 0.3  --  0.2  --  0.2  --   --   --   --   BASOSABS 0.0 0.0 0.0  --  0.0  --  0.0  --   --   --   --    < > = values in this interval not displayed.    Electrolytes Recent Labs  Lab 07/06/21 0156 07/06/21 1216 07/06/21 1216 07/07/21 0113 07/08/21 0110 07/09/21 0123 07/10/21 0506 07/11/21 0150  NA  --  134*   < > 134* 132* 131* 130* 131*  K  --  4.0   < > 4.1 4.7 4.0 4.2 4.2  CL  --  106   < > 107 105 101 101 103  CO2  --  24   < > 21* 21* 23 23 24   GLUCOSE  --  128*   < > 96 94 104* 86 94  BUN  --  20   < > 24* 31* 30* 28* 29*  CREATININE  --  1.42*   < > 1.24 1.12 1.20 1.28* 1.29*  CALCIUM  --  7.8*   < > 7.7* 7.8* 7.8* 8.1* 7.8*  AST  --  20  --  19 22 26  35  --   ALT  --  13  --  14 14 18 23   --   ALKPHOS  --  42  --  44 44 44 45  --   BILITOT  --  0.5  --  0.7 0.5 0.5 0.6  --   ALBUMIN  --  2.1*  --  2.0* 1.9*  2.0* 2.1*  --   MG  --  1.9  --  1.9 1.8 1.9 1.9  --   INR 1.4*  --   --  1.3* 1.3* 1.1  --   --   BNP  --   --   --  530.9* 437.7* 451.0*  --   --    < > = values in this interval not displayed.    ------------------------------------------------------------------------------------------------------------------ No results for input(s): CHOL, HDL, LDLCALC, TRIG, CHOLHDL, LDLDIRECT in the last 72 hours.  No results found for: HGBA1C  No results for input(s): TSH, T4TOTAL, T3FREE, THYROIDAB in the last 72 hours.  Invalid input(s): FREET3 ------------------------------------------------------------------------------------------------------------------ ID Labs Recent Labs  Lab 07/07/21 0113 07/07/21 5366 07/08/21 0110 07/08/21 0617 07/09/21 0123 07/09/21 1304 07/10/21 0506 07/10/21 1345 07/11/21 0150 07/11/21 1427 07/12/21 0059  WBC 4.8   < > 4.4   < > 4.4   < > 3.8* 3.9* 4.0 3.5* 4.2  PLT 140*   < > 136*   < > 196   < > 173 181 180 206 229  CREATININE 1.24  --  1.12  --  1.20  --  1.28*  --  1.29*  --   --    < > = values in this interval not displayed.   Cardiac Enzymes No results for input(s): CKMB, TROPONINI, MYOGLOBIN in the last 168 hours.  Invalid input(s): CK   Radiology Reports ECHOCARDIOGRAM COMPLETE  Result Date: 07/10/2021    ECHOCARDIOGRAM REPORT   Patient Name:   Richard Spears [facility] Date of Exam: 07/10/2021 Medical Rec #:  [id number]           Height:       72.0 in Accession #:    [id number]          Weight:       149.9 lb Date of Birth:  [birth year]           BSA:          1.885 m Patient Age:    83 years            BP:           142/78 mmHg Patient Gender: M                   HR:           96 bpm. Exam Location:  Inpatient Procedure: 2D Echo Indications:    Mitral valve replacement  History:        Patient has prior history of Echocardiogram examinations, most                 recent 01/18/2019. Aortic Valve Disease; Risk                 Factors:Dyslipidemia. Mitral  valve replacement.  Sonographer:    Arlyss Gandy Referring Phys: Richard Spears Comments: Patient supine IMPRESSIONS  1. Left ventricular ejection fraction, by estimation, is 55 to 60%. The left ventricle has normal function. The left ventricle has no regional wall motion abnormalities. There is mild  concentric left ventricular hypertrophy. Diastolic function is indeterminant due to Afib.  2. Right ventricular systolic function is mildly reduced. The right ventricular size is mildly enlarged. There is normal pulmonary artery systolic pressure. The estimated right ventricular systolic pressure is 12.4 mmHg.  3. Left atrial size was severely dilated.  4. Right atrial size was severely dilated.  5. The mitral valve has been repaired/replaced. Patient is s/p St Jude bileaflet mechanical valve replacement. Echo findings are consistent with normal structure and function of the mitral valve prosthesis. Mean gradient is 51mmHg at 99bpm.  6. The aortic valve is tricuspid. There is mild calcification of the aortic valve. There is mild thickening of the aortic valve. Aortic valve regurgitation is moderate.  7. Aortic dilatation noted. There is borderline dilatation of the aortic root, measuring 37 mm.  8. The inferior vena cava is normal in size with <50% respiratory variability, suggesting right atrial pressure of 8 mmHg. Comparison(s): Compared to prior TTE in 2020, the AR now appears moderate. Otherwise no significant change. Mean mitral valve gradient stable at ~5-76mmHg. FINDINGS  Left Ventricle: Left ventricular ejection fraction, by estimation, is 55 to 60%. The left ventricle has normal function. The left ventricle has no regional wall motion abnormalities. The left ventricular internal cavity size was normal in size. There is  mild concentric left ventricular hypertrophy. Abnormal (paradoxical) septal motion consistent with post-operative status. Diastolic function is indeterminant due to Afib. Right  Ventricle: The right ventricular size is mildly enlarged. No increase in right ventricular wall thickness. Right ventricular systolic function is mildly reduced. There is normal pulmonary artery systolic pressure. The tricuspid regurgitant velocity  is 2.76 m/s, and with an assumed right atrial pressure of 3 mmHg, the estimated right ventricular systolic pressure is 58.0 mmHg. Left Atrium: Left atrial size was severely dilated. Right Atrium: Right atrial size was severely dilated. Pericardium: There is no evidence of pericardial effusion. Mitral Valve: Mean gradient 84mmHg at HR 99bpm. The mitral valve has been repaired/replaced. There is a St. Jude present in the mitral position. Echo findings are consistent with normal structure and function of the mitral valve prosthesis. MV peak gradient, 14.4 mmHg. The mean mitral valve gradient is 6.0 mmHg. Tricuspid Valve: The tricuspid valve is normal in structure. Tricuspid valve regurgitation is mild. Aortic Valve: The aortic valve is tricuspid. There is mild calcification of the aortic valve. There is mild thickening of the aortic valve. Aortic valve regurgitation is moderate. Aortic regurgitation PHT measures 364 msec. Aortic valve mean gradient measures 5.5 mmHg. Aortic valve peak gradient measures 10.1 mmHg. Aortic valve area, by VTI measures 1.94 cm. Pulmonic Valve: The pulmonic valve was normal in structure. Pulmonic valve regurgitation is trivial. Aorta: Aortic dilatation noted. There is borderline dilatation of the aortic root, measuring 37 mm. Venous: The inferior vena cava is normal in size with less than 50% respiratory variability, suggesting right atrial pressure of 8 mmHg. IAS/Shunts: The atrial septum is grossly normal.  LEFT VENTRICLE PLAX 2D LVIDd:         4.20 cm   Diastology LVIDs:         3.10 cm   LV e' medial:    9.30 cm/s LV PW:         1.30 cm   LV E/e' medial:  14.7 LV IVS:        1.30 cm   LV e' lateral:   12.80 cm/s LVOT diam:     2.10 cm   LV  E/e' lateral:  10.7 LV SV:         53 LV SV Index:   28 LVOT Area:     3.46 cm  RIGHT VENTRICLE             IVC RV Basal diam:  4.10 cm     IVC diam: 1.80 cm RV S prime:     10.70 cm/s TAPSE (M-mode): 1.6 cm LEFT ATRIUM              Index        RIGHT ATRIUM           Index LA diam:        5.00 cm  2.65 cm/m   RA Area:     29.70 cm LA Vol (A2C):   102.0 ml 54.11 ml/m  RA Volume:   98.90 ml  52.47 ml/m LA Vol (A4C):   103.0 ml 54.64 ml/m LA Biplane Vol: 104.0 ml 55.18 ml/m  AORTIC VALVE AV Area (Vmax):    1.98 cm AV Area (Vmean):   1.95 cm AV Area (VTI):     1.94 cm AV Vmax:           159.00 cm/s AV Vmean:          108.350 cm/s AV VTI:            0.274 m AV Peak Grad:      10.1 mmHg AV Mean Grad:      5.5 mmHg LVOT Vmax:         90.95 cm/s LVOT Vmean:        61.150 cm/s LVOT VTI:          0.154 m LVOT/AV VTI ratio: 0.56 AI PHT:            364 msec  AORTA Ao Root diam: 3.70 cm Ao Asc diam:  3.10 cm MITRAL VALVE                TRICUSPID VALVE MV Area (PHT): 2.87 cm     TR Peak grad:   30.5 mmHg MV Area VTI:   1.60 cm     TR Vmax:        276.00 cm/s MV Peak grad:  14.4 mmHg MV Mean grad:  6.0 mmHg     SHUNTS MV Vmax:       1.90 m/s     Systemic VTI:  0.15 m MV Vmean:      112.0 cm/s   Systemic Diam: 2.10 cm MV Decel Time: 264 msec MV E velocity: 137.00 cm/s MV A velocity: 0.00 cm/s Gwyndolyn Kaufman MD Electronically signed by Gwyndolyn Kaufman MD Signature Date/Time: 07/10/2021/3:10:59 PM    Final    VAS [place] UPPER EXTREMITY VENOUS DUPLEX  Result Date: 07/10/2021 UPPER VENOUS STUDY  Patient Name:  Richard Spears  Date of Exam:   07/10/2021 Medical Rec #: [id number]            Accession #:    [id number] Date of Birth: 04/22/1926            Patient Gender: M Patient Age:   89 years Exam Location:  [facility] Procedure:      VAS [place] UPPER EXTREMITY VENOUS DUPLEX Referring Phys: Richard Spears --------------------------------------------------------------------------------  Indications: Pain,  Swelling, and Erythema Comparison Study: No prior study Performing Technologist: Maudry Mayhew MHA, RDMS, RVT, RDCS  Examination Guidelines: A complete evaluation includes B-mode imaging, spectral Doppler, color Doppler, and power Doppler as needed of all accessible  portions of each vessel. Bilateral testing is considered an integral part of a complete examination. Limited examinations for reoccurring indications may be performed as noted.  Right Findings: +----------+------------+---------+-----------+----------+--------------------+  RIGHT      Compressible Phasicity Spontaneous Properties       Summary         +----------+------------+---------+-----------+----------+--------------------+  IJV            Full        Yes        Yes                                      +----------+------------+---------+-----------+----------+--------------------+  Subclavian     Full        Yes        Yes                                      +----------+------------+---------+-----------+----------+--------------------+  Axillary       Full        Yes        Yes                                      +----------+------------+---------+-----------+----------+--------------------+  Brachial       Full        Yes        Yes                                      +----------+------------+---------+-----------+----------+--------------------+  Radial         Full                                                            +----------+------------+---------+-----------+----------+--------------------+  Ulnar          Full                                                            +----------+------------+---------+-----------+----------+--------------------+  Cephalic       None                                       Acute, mid forearm                                                                     only          +----------+------------+---------+-----------+----------+--------------------+  Basilic        Full                                                             +----------+------------+---------+-----------+----------+--------------------+  Left Findings: +----------+------------+---------+-----------+----------+-------+  LEFT       Compressible Phasicity Spontaneous Properties Summary  +----------+------------+---------+-----------+----------+-------+  Subclavian                 Yes        Yes                         +----------+------------+---------+-----------+----------+-------+  Summary:  Right: No evidence of deep vein thrombosis in the upper extremity. Findings consistent with acute superficial vein thrombosis involving the right cephalic vein.  Left: No evidence of thrombosis in the subclavian.  *See table(s) above for measurements and observations.  Diagnosing physician: Richard Spears Electronically signed by Richard Spears on 07/10/2021 at 2:49:56 PM.    Final      Patient ID: [name], male   DOB: 1926/03/21, 88 y.o.   MRN: [id number] Patient ID: MADDEN GARRON, male   DOB: 03/11/1926, 88 y.o.   MRN: [id number] Patient ID: MADDON HORTON, male   DOB: 03-14-1926, 88 y.o.   MRN: [id number] Patient ID: NEELY CECENA, male   DOB: 1926/05/08, 88 y.o.   MRN: [id number] Patient ID: DERIUS GHOSH, male   DOB: 1926/05/22, 88 y.o.   MRN: [id number]

## 2021-07-12 NOTE — Progress Notes (Signed)
Patient ID: Richard Spears, male   DOB: 01/17/1926, 88 y.o.   MRN: [id number] [facility] Surgery Progress Note  12 Days Post-Op  Subjective: CC-  No complaints.  Eating well.  No further bloody BMs since his last one now ~3 days ago.  Heparin restarted 1/12 @ 2000.  Objective: Vital signs in last 24 hours: Temp:  [97.7 F (36.5 C)-98.3 F (36.8 C)] 98.3 F (36.8 C) (01/15 0318) Pulse Rate:  [62-103] 62 (01/15 0318) Resp:  [15-17] 17 (01/15 0318) BP: (114-144)/(39-108) 120/39 (01/15 0318) SpO2:  [90 %-100 %] 99 % (01/15 0318) Last BM Date: 07/10/21  Intake/Output from previous day: 01/14 0701 - 01/15 0700 In: 541.7 [I.V.:541.7] Out: 1360 [Urine:1360] Intake/Output this shift: Total I/O In: 24.8 [I.V.:24.8] Out: -   PE: Gen:  Alert, NAD, pleasant Pulm: rate and effort normal Abd: Soft, ND, nontender, midline incision cdi with staples present and no erythema or drainage  Lab Results:  Recent Labs    07/11/21 1427 07/12/21 0059  WBC 3.5* 4.2  HGB 8.8* 8.7*  HCT 27.1* 26.0*  PLT 206 229   BMET Recent Labs    07/10/21 0506 07/11/21 0150  NA 130* 131*  K 4.2 4.2  CL 101 103  CO2 23 24  GLUCOSE 86 94  BUN 28* 29*  CREATININE 1.28* 1.29*  CALCIUM 8.1* 7.8*   PT/INR No results for input(s): LABPROT, INR in the last 72 hours.  CMP     Component Value Date/Time   NA 131 (L) 07/11/2021 0150   NA 134 (A) 10/23/2020 1035   NA 130 10/20/2017 0000   K 4.2 07/11/2021 0150   K 4.2 10/20/2017 0000   CL 103 07/11/2021 0150   CO2 24 07/11/2021 0150   GLUCOSE 94 07/11/2021 0150   BUN 29 (H) 07/11/2021 0150   BUN 33 (A) 10/23/2020 1035   CREATININE 1.29 (H) 07/11/2021 0150   CREATININE 1.38 10/20/2017 0000   CALCIUM 7.8 (L) 07/11/2021 0150   CALCIUM 8.6 10/20/2017 0000   PROT 5.1 (L) 07/10/2021 0506   ALBUMIN 2.1 (L) 07/10/2021 0506   AST 35 07/10/2021 0506   ALT 23 07/10/2021 0506   ALKPHOS 45 07/10/2021 0506   BILITOT 0.6 07/10/2021 0506    GFRNONAA 52 (L) 07/11/2021 0150   GFRAA 61 10/23/2020 1035   Lipase     Component Value Date/Time   LIPASE 34 06/24/2021 1220       Studies/Results: ECHOCARDIOGRAM COMPLETE  Result Date: 07/10/2021    ECHOCARDIOGRAM REPORT   Patient Name:   Richard Spears Date of Exam: 07/10/2021 Medical Rec #:  [id number]           Height:       72.0 in Accession #:    [id number]          Weight:       149.9 lb Date of Birth:  24-Dec-1925           BSA:          1.885 m Patient Age:    18 years            BP:           142/78 mmHg Patient Gender: M                   HR:           96 bpm. Exam Location:  Inpatient Procedure: 2D Echo Indications:    Mitral valve replacement  History:        Patient has prior history of Echocardiogram examinations, most                 recent 01/18/2019. Aortic Valve Disease; Risk                 Factors:Dyslipidemia. Mitral valve replacement.  Sonographer:    Arlyss Gandy Referring Phys: Steen Comments: Patient supine IMPRESSIONS  1. Left ventricular ejection fraction, by estimation, is 55 to 60%. The left ventricle has normal function. The left ventricle has no regional wall motion abnormalities. There is mild concentric left ventricular hypertrophy. Diastolic function is indeterminant due to Afib.  2. Right ventricular systolic function is mildly reduced. The right ventricular size is mildly enlarged. There is normal pulmonary artery systolic pressure. The estimated right ventricular systolic pressure is 67.6 mmHg.  3. Left atrial size was severely dilated.  4. Right atrial size was severely dilated.  5. The mitral valve has been repaired/replaced. Patient is s/p St Jude bileaflet mechanical valve replacement. Echo findings are consistent with normal structure and function of the mitral valve prosthesis. Mean gradient is 41mmHg at 99bpm.  6. The aortic valve is tricuspid. There is mild calcification of the aortic valve. There is mild thickening of the  aortic valve. Aortic valve regurgitation is moderate.  7. Aortic dilatation noted. There is borderline dilatation of the aortic root, measuring 37 mm.  8. The inferior vena cava is normal in size with <50% respiratory variability, suggesting right atrial pressure of 8 mmHg. Comparison(s): Compared to prior TTE in 2020, the AR now appears moderate. Otherwise no significant change. Mean mitral valve gradient stable at ~5-31mmHg. FINDINGS  Left Ventricle: Left ventricular ejection fraction, by estimation, is 55 to 60%. The left ventricle has normal function. The left ventricle has no regional wall motion abnormalities. The left ventricular internal cavity size was normal in size. There is  mild concentric left ventricular hypertrophy. Abnormal (paradoxical) septal motion consistent with post-operative status. Diastolic function is indeterminant due to Afib. Right Ventricle: The right ventricular size is mildly enlarged. No increase in right ventricular wall thickness. Right ventricular systolic function is mildly reduced. There is normal pulmonary artery systolic pressure. The tricuspid regurgitant velocity  is 2.76 m/s, and with an assumed right atrial pressure of 3 mmHg, the estimated right ventricular systolic pressure is 19.5 mmHg. Left Atrium: Left atrial size was severely dilated. Right Atrium: Right atrial size was severely dilated. Pericardium: There is no evidence of pericardial effusion. Mitral Valve: Mean gradient 16mmHg at HR 99bpm. The mitral valve has been repaired/replaced. There is a St. Jude present in the mitral position. Echo findings are consistent with normal structure and function of the mitral valve prosthesis. MV peak gradient, 14.4 mmHg. The mean mitral valve gradient is 6.0 mmHg. Tricuspid Valve: The tricuspid valve is normal in structure. Tricuspid valve regurgitation is mild. Aortic Valve: The aortic valve is tricuspid. There is mild calcification of the aortic valve. There is mild thickening  of the aortic valve. Aortic valve regurgitation is moderate. Aortic regurgitation PHT measures 364 msec. Aortic valve mean gradient measures 5.5 mmHg. Aortic valve peak gradient measures 10.1 mmHg. Aortic valve area, by VTI measures 1.94 cm. Pulmonic Valve: The pulmonic valve was normal in structure. Pulmonic valve regurgitation is trivial. Aorta: Aortic dilatation noted. There is borderline dilatation of the aortic root, measuring 37 mm. Venous: The inferior vena cava is normal in size with less than 50% respiratory  variability, suggesting right atrial pressure of 8 mmHg. IAS/Shunts: The atrial septum is grossly normal.  LEFT VENTRICLE PLAX 2D LVIDd:         4.20 cm   Diastology LVIDs:         3.10 cm   LV e' medial:    9.30 cm/s LV PW:         1.30 cm   LV E/e' medial:  14.7 LV IVS:        1.30 cm   LV e' lateral:   12.80 cm/s LVOT diam:     2.10 cm   LV E/e' lateral: 10.7 LV SV:         53 LV SV Index:   28 LVOT Area:     3.46 cm  RIGHT VENTRICLE             IVC RV Basal diam:  4.10 cm     IVC diam: 1.80 cm RV S prime:     10.70 cm/s TAPSE (M-mode): 1.6 cm LEFT ATRIUM              Index        RIGHT ATRIUM           Index LA diam:        5.00 cm  2.65 cm/m   RA Area:     29.70 cm LA Vol (A2C):   102.0 ml 54.11 ml/m  RA Volume:   98.90 ml  52.47 ml/m LA Vol (A4C):   103.0 ml 54.64 ml/m LA Biplane Vol: 104.0 ml 55.18 ml/m  AORTIC VALVE AV Area (Vmax):    1.98 cm AV Area (Vmean):   1.95 cm AV Area (VTI):     1.94 cm AV Vmax:           159.00 cm/s AV Vmean:          108.350 cm/s AV VTI:            0.274 m AV Peak Grad:      10.1 mmHg AV Mean Grad:      5.5 mmHg LVOT Vmax:         90.95 cm/s LVOT Vmean:        61.150 cm/s LVOT VTI:          0.154 m LVOT/AV VTI ratio: 0.56 AI PHT:            364 msec  AORTA Ao Root diam: 3.70 cm Ao Asc diam:  3.10 cm MITRAL VALVE                TRICUSPID VALVE MV Area (PHT): 2.87 cm     TR Peak grad:   30.5 mmHg MV Area VTI:   1.60 cm     TR Vmax:        276.00 cm/s MV  Peak grad:  14.4 mmHg MV Mean grad:  6.0 mmHg     SHUNTS MV Vmax:       1.90 m/s     Systemic VTI:  0.15 m MV Vmean:      112.0 cm/s   Systemic Diam: 2.10 cm MV Decel Time: 264 msec MV E velocity: 137.00 cm/s MV A velocity: 0.00 cm/s Gwyndolyn Kaufman MD Electronically signed by Gwyndolyn Kaufman MD Signature Date/Time: 07/10/2021/3:10:59 PM    Final    VAS [place] UPPER EXTREMITY VENOUS DUPLEX  Result Date: 07/10/2021 UPPER VENOUS STUDY  Patient Name:  [name]  Date of Exam:   07/10/2021 Medical  Rec #: [id number]            Accession #:    [id number] Date of Birth: [birth year]            Patient Gender: M Patient Age:   72 years Exam [facility]  [facility] Procedure:      VAS [place] UPPER EXTREMITY VENOUS DUPLEX Referring Phys: Inda Merlin --------------------------------------------------------------------------------  Indications: Pain, Swelling, and Erythema Comparison Study: No prior study Performing Technologist: Maudry Mayhew MHA, RDMS, RVT, RDCS  Examination Guidelines: A complete evaluation includes B-mode imaging, spectral Doppler, color Doppler, and power Doppler as needed of all accessible portions of each vessel. Bilateral testing is considered an integral part of a complete examination. Limited examinations for reoccurring indications may be performed as noted.  Right Findings: +----------+------------+---------+-----------+----------+--------------------+  RIGHT      Compressible Phasicity Spontaneous Properties       Summary         +----------+------------+---------+-----------+----------+--------------------+  IJV            Full        Yes        Yes                                      +----------+------------+---------+-----------+----------+--------------------+  Subclavian     Full        Yes        Yes                                      +----------+------------+---------+-----------+----------+--------------------+  Axillary       Full        Yes        Yes                                       +----------+------------+---------+-----------+----------+--------------------+  Brachial       Full        Yes        Yes                                      +----------+------------+---------+-----------+----------+--------------------+  Radial         Full                                                            +----------+------------+---------+-----------+----------+--------------------+  Ulnar          Full                                                            +----------+------------+---------+-----------+----------+--------------------+  Cephalic       None  Acute, mid forearm                                                                     only          +----------+------------+---------+-----------+----------+--------------------+  Basilic        Full                                                            +----------+------------+---------+-----------+----------+--------------------+  Left Findings: +----------+------------+---------+-----------+----------+-------+  LEFT       Compressible Phasicity Spontaneous Properties Summary  +----------+------------+---------+-----------+----------+-------+  Subclavian                 Yes        Yes                         +----------+------------+---------+-----------+----------+-------+  Summary:  Right: No evidence of deep vein thrombosis in the upper extremity. Findings consistent with acute superficial vein thrombosis involving the right cephalic vein.  Left: No evidence of thrombosis in the subclavian.  *See table(s) above for measurements and observations.  Diagnosing physician: Jamelle Haring Electronically signed by Jamelle Haring on 07/10/2021 at 2:49:56 PM.    Final     Anti-infectives: Anti-infectives (From admission, onward)    Start     Dose/Rate Route Frequency Ordered Stop   07/10/21 0630  cephALEXin (KEFLEX) capsule 500 mg        500 mg Oral 3 times daily before meals &  bedtime 07/10/21 2831 07/15/21 5176        Assessment/Plan OR 1/3 (Dr. 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 - likely intermittent oozing from anastomosis due to anticoagulation requirement. Abdominal exam is benign. Received 2 units PRBCs 07/04/21 and again 1/11.  -Hgb 8.7<--8.2<--9.8<--8.8.  no further bleeding or bloody BMs since 1/12. On iron, stools will be black. Ok for PO anticoagulation, wean heparin as able   FEN - reg diet VTE - heparin restarted 1/12 ID - none currently Foley - out 1/5 and voiding    A fib MVR on coumadin CKD   LOS: 18 days   Nadeen Landau, MD [facility], [place]

## 2021-07-13 LAB — BASIC METABOLIC PANEL
Anion gap: 9 (ref 5–15)
BUN: 28 mg/dL — ABNORMAL HIGH (ref 8–23)
CO2: 21 mmol/L — ABNORMAL LOW (ref 22–32)
Calcium: 8.3 mg/dL — ABNORMAL LOW (ref 8.9–10.3)
Chloride: 102 mmol/L (ref 98–111)
Creatinine, Ser: 1.16 mg/dL (ref 0.61–1.24)
GFR, Estimated: 59 mL/min — ABNORMAL LOW (ref >60)
Glucose, Bld: 87 mg/dL (ref 70–99)
Potassium: 4.1 mmol/L (ref 3.5–5.1)
Sodium: 132 mmol/L — ABNORMAL LOW (ref 135–145)

## 2021-07-13 LAB — CBC
HCT: 25.5 % — ABNORMAL LOW (ref 39.0–52.0)
Hemoglobin: 8.5 g/dL — ABNORMAL LOW (ref 13.0–17.0)
MCH: 31.8 pg (ref 26.0–34.0)
MCHC: 33.3 g/dL (ref 30.0–36.0)
MCV: 95.5 fL (ref 80.0–100.0)
Platelets: 229 10*3/uL (ref 150–400)
RBC: 2.67 MIL/uL — ABNORMAL LOW (ref 4.22–5.81)
RDW: 19.2 % — ABNORMAL HIGH (ref 11.5–15.5)
WBC: 3.6 10*3/uL — ABNORMAL LOW (ref 4.0–10.5)
nRBC: 0 % (ref 0.0–0.2)

## 2021-07-13 LAB — PROTIME-INR
INR: 1.1 (ref 0.8–1.2)
Prothrombin Time: 13.7 seconds (ref 11.4–15.2)

## 2021-07-13 LAB — GLUCOSE, CAPILLARY
Glucose-Capillary: 134 mg/dL — ABNORMAL HIGH (ref 70–99)
Glucose-Capillary: 139 mg/dL — ABNORMAL HIGH (ref 70–99)
Glucose-Capillary: 146 mg/dL — ABNORMAL HIGH (ref 70–99)
Glucose-Capillary: 138 mg/dL — ABNORMAL HIGH (ref 70–99)

## 2021-07-13 LAB — HEPARIN LEVEL (UNFRACTIONATED): Heparin Unfractionated: 0.48 IU/mL (ref 0.30–0.70)

## 2021-07-13 MED ORDER — POLYETHYLENE GLYCOL 3350 17 G PO PACK
34.0000 g | PACK | Freq: Once | ORAL | Status: AC
  Administered 2021-07-13: 34 g via ORAL
  Filled 2021-07-13: qty 2

## 2021-07-13 MED ORDER — BISACODYL 5 MG PO TBEC
10.0000 mg | DELAYED_RELEASE_TABLET | Freq: Once | ORAL | Status: AC
  Administered 2021-07-13: 10 mg via ORAL
  Filled 2021-07-13: qty 2

## 2021-07-13 MED ORDER — WARFARIN SODIUM 7.5 MG PO TABS
7.5000 mg | ORAL_TABLET | Freq: Once | ORAL | Status: AC
  Administered 2021-07-13: 7.5 mg via ORAL
  Filled 2021-07-13: qty 1

## 2021-07-13 NOTE — Progress Notes (Signed)
Occupational Therapy Treatment Patient Details Name: Richard Spears MRN: [id number] DOB: Mar 31, 1926 Today's Date: 07/13/2021   History of present illness 88 y.o. male presentign to ED 12/28 with melana. Patient admitted with GI bleed and anemia. CT (+) lower GI bleed vs mass. CT also found large L inguinal hernia containing nonobstructed loop of large bowel. Colonoscopy 12/31 confirmed near obstructing mass in transverse colon. Biopsies obtained; path pending. S/p partial transverse colectomy 1/3. PMH includes: A-fib, BPH, OA, HTN, BLE venous ulcers, renal insufficiency, mechanical mitral valve replacement, Hx of fall 08/2020 s/p L femur ORIF and R/L hip arthroplasties.   OT comments  Patient seen by skilled OT to address functional mobility, grooming standing at sink, and bed mobility. Patient required increased time to perform most tasks and was min guard to min assist to perform.  Patient making good progress with OT treatment. Acute OT to continue to follow.    Recommendations for follow up therapy are one component of a multi-disciplinary discharge planning process, led by the attending physician.  Recommendations may be updated based on patient status, additional functional criteria and insurance authorization.    Follow Up Recommendations  Skilled nursing-short term rehab (<3 hours/day)    Assistance Recommended at Discharge Frequent or constant Supervision/Assistance  Patient can return home with the following  A little help with walking and/or transfers;A lot of help with bathing/dressing/bathroom;Assistance with cooking/housework;Direct supervision/assist for medications management;Direct supervision/assist for financial management;Assist for transportation;Help with stairs or ramp for entrance   Equipment Recommendations  None recommended by OT    Recommendations for Other Services      Precautions / Restrictions Precautions Precautions: Fall Precaution Comments: Hx of  falls; monitor HR; abdominal incision w/ precautions; BLE unna boots       Mobility Bed Mobility Overal bed mobility: Needs Assistance Bed Mobility: Sit to Supine     Supine to sit: Min guard     General bed mobility comments: increased time to perform    Transfers Overall transfer level: Needs assistance Equipment used: Rolling walker (2 wheels) Transfers: Sit to/from Stand Sit to Stand: Min guard           General transfer comment: performed transfer with RW     Balance Overall balance assessment: Needs assistance Sitting-balance support: No upper extremity supported;Feet supported Sitting balance-Leahy Scale: Fair     Standing balance support: Bilateral upper extremity supported;Single extremity supported;During functional activity Standing balance-Leahy Scale: Poor Standing balance comment: performed grooming standing at sink                           ADL either performed or assessed with clinical judgement   ADL Overall ADL's : Needs assistance/impaired     Grooming: Min guard;Standing;Wash/dry hands;Wash/dry face;Oral care Grooming Details (indicate cue type and reason): stood at sink                               General ADL Comments: required increased time to perform grooming tasks    Extremity/Trunk Assessment              Vision       Perception     Praxis      Cognition Arousal/Alertness: Awake/alert Behavior During Therapy: WFL for tasks assessed/performed Overall Cognitive Status: Within Functional Limits for tasks assessed  General Comments: followed directions well          Exercises     Shoulder Instructions       General Comments Utilized urinal during session, also requiring incr time    Pertinent Vitals/ Pain       Pain Assessment: No/denies pain  Home Living                                          Prior  Functioning/Environment              Frequency  Min 2X/week        Progress Toward Goals  OT Goals(current goals can now be found in the care plan section)  Progress towards OT goals: Progressing toward goals  Acute Rehab OT Goals Patient Stated Goal: get better OT Goal Formulation: With patient Time For Goal Achievement: 07/15/21 Potential to Achieve Goals: Good ADL Goals Pt Will Perform Grooming: with modified independence;standing Pt Will Perform Upper Body Dressing: with modified independence;sitting Pt Will Perform Lower Body Dressing: with modified independence;sit to/from stand Pt Will Transfer to Toilet: with modified independence;ambulating Pt Will Perform Toileting - Clothing Manipulation and hygiene: with modified independence;sit to/from stand Pt/caregiver will Perform Home Exercise Program: Increased ROM;Increased strength;Both right and left upper extremity;Independently Additional ADL Goal #1: Patient will recall 3 strategies to reduce risk of falls in prep for safe d/c home.  Plan Discharge plan remains appropriate;Frequency remains appropriate    Co-evaluation                 AM-PAC OT "6 Clicks" Daily Activity     Outcome Measure   Help from another person eating meals?: A Little Help from another person taking care of personal grooming?: A Little Help from another person toileting, which includes using toliet, bedpan, or urinal?: A Lot Help from another person bathing (including washing, rinsing, drying)?: A Lot Help from another person to put on and taking off regular upper body clothing?: A Little Help from another person to put on and taking off regular lower body clothing?: A Lot 6 Click Score: 15    End of Session Equipment Utilized During Treatment: Rolling walker (2 wheels);Gait belt  OT Visit Diagnosis: Other abnormalities of gait and mobility (R26.89);Muscle weakness (generalized) (M62.81);Pain;History of falling (Z91.81)    Activity Tolerance Patient tolerated treatment well   Patient Left in bed;with call bell/phone within reach;with bed alarm set   Nurse Communication Mobility status        Time: 4917-9150 OT Time Calculation (min): 24 min  Charges: OT General Charges $OT Visit: 1 Visit OT Treatments $Self Care/Home Management : 8-22 mins $Therapeutic Activity: 8-22 mins  Lodema Hong, [place]  Pager [phone] Office [place] 07/13/2021, 3:14 PM

## 2021-07-13 NOTE — Progress Notes (Signed)
Physical Therapy Treatment Patient Details Name: Richard Spears MRN: [id number] DOB: 1926/01/04 Today's Date: 07/13/2021   History of Present Illness 88 y.o. male presentign to ED 12/28 with melana. Patient admitted with GI bleed and anemia. CT (+) lower GI bleed vs mass. CT also found large L inguinal hernia containing nonobstructed loop of large bowel. Colonoscopy 12/31 confirmed near obstructing mass in transverse colon. Biopsies obtained; path pending. S/p partial transverse colectomy 1/3. PMH includes: A-fib, BPH, OA, HTN, BLE venous ulcers, renal insufficiency, mechanical mitral valve replacement, Hx of fall 08/2020 s/p L femur ORIF and R/L hip arthroplasties.    PT Comments    Patient pleased with his progress and the fact that he did not have any dizziness this session. Continues to move very slowly, requiring incr time for all mobility. Able to ambulate 100 ft with RW with minguard assist.    Recommendations for follow up therapy are one component of a multi-disciplinary discharge planning process, led by the attending physician.  Recommendations may be updated based on patient status, additional functional criteria and insurance authorization.  Follow Up Recommendations  Skilled nursing-short term rehab (<3 hours/day)     Assistance Recommended at Discharge Intermittent Supervision/Assistance  Patient can return home with the following A little help with walking and/or transfers;A lot of help with bathing/dressing/bathroom;Assist for transportation   Equipment Recommendations  None recommended by PT    Recommendations for Other Services       Precautions / Restrictions Precautions Precautions: Fall Precaution Comments: Hx of falls; monitor HR; abdominal incision w/ precautions; BLE unna boots     Mobility  Bed Mobility Overal bed mobility: Needs Assistance Bed Mobility: Supine to Sit     Supine to sit: HOB elevated;Supervision (HOB 15)     General bed mobility  comments: incr time for all mobility, but no assist needed    Transfers Overall transfer level: Needs assistance Equipment used: Rolling walker (2 wheels) Transfers: Sit to/from Stand Sit to Stand: Min guard           General transfer comment: x6 reps to RW for strengthening    Ambulation/Gait Ambulation/Gait assistance: Min guard Gait Distance (Feet): 100 Feet Assistive device: Rolling walker (2 wheels) Gait Pattern/deviations: Step-through pattern;Decreased stride length Gait velocity: decreased     General Gait Details: denied dizziness; very slow cadence   Stairs             Wheelchair Mobility    Modified Rankin (Stroke Patients Only)       Balance Overall balance assessment: Needs assistance Sitting-balance support: No upper extremity supported;Feet supported Sitting balance-Leahy Scale: Fair     Standing balance support: Bilateral upper extremity supported Standing balance-Leahy Scale: Poor                              Cognition Arousal/Alertness: Awake/alert Behavior During Therapy: WFL for tasks assessed/performed Overall Cognitive Status: Within Functional Limits for tasks assessed                                          Exercises      General Comments General comments (skin integrity, edema, etc.): Utilized urinal during session, also requiring incr time      Pertinent Vitals/Pain Pain Assessment: No/denies pain    Home Living  Prior Function            PT Goals (current goals can now be found in the care plan section) Acute Rehab PT Goals Patient Stated Goal: [facility] to work on [place] Time For Goal Achievement: 07/15/21 Potential to Achieve Goals: Good Progress towards PT goals: Progressing toward goals    Frequency    Min 2X/week      PT Plan Current plan remains appropriate    Co-evaluation              AM-PAC  PT "6 Clicks" Mobility   Outcome Measure  Help needed turning from your back to your side while in a flat bed without using bedrails?: None Help needed moving from lying on your back to sitting on the side of a flat bed without using bedrails?: None Help needed moving to and from a bed to a chair (including a wheelchair)?: A Little Help needed standing up from a chair using your arms (e.g., wheelchair or bedside chair)?: A Little Help needed to walk in hospital room?: A Little Help needed climbing 3-5 steps with a railing? : A Lot 6 Click Score: 19    End of Session Equipment Utilized During Treatment: Gait belt Activity Tolerance: Patient tolerated treatment well Patient left: in chair;with chair alarm set;with call bell/phone within reach Nurse Communication: Mobility status PT Visit Diagnosis: Other abnormalities of gait and mobility (R26.89);Muscle weakness (generalized) (M62.81);Difficulty in walking, not elsewhere classified (R26.2)     Time: 8127-5170 PT Time Calculation (min) (ACUTE ONLY): 38 min  Charges:  $Gait Training: 23-37 mins                      [name], PT [place]  Pager [phone] Office [phone]    [name] 07/13/2021, 12:39 PM

## 2021-07-13 NOTE — Progress Notes (Signed)
13 Days Post-Op   Subjective/Chief Complaint: Pt doing well this AM Tol Reg PO No further GIB   Objective: Vital signs in last 24 hours: Temp:  [97.7 F (36.5 C)-98.6 F (37 C)] 98.6 F (37 C) (01/15 2348) Pulse Rate:  [56-87] 60 (01/15 2348) Resp:  [16-18] 18 (01/15 2348) BP: (88-178)/(42-88) 121/59 (01/15 2348) SpO2:  [94 %-98 %] 98 % (01/15 2348) Last BM Date: 07/10/21  Intake/Output from previous day: 01/15 0701 - 01/16 0700 In: 312.1 [I.V.:312.1] Out: 1100 [Urine:1100] Intake/Output this shift: No intake/output data recorded.  PE:  Constitutional: No acute distress, conversant, appears states age. Eyes: Anicteric sclerae, moist conjunctiva, no lid lag Lungs: Clear to auscultation bilaterally, normal respiratory effort CV: regular rate and rhythm, no murmurs, no peripheral edema, pedal pulses 2+ GI: Soft, no masses or hepatosplenomegaly, non-tender to palpation, inc  c/d/I Skin: No rashes, palpation reveals normal turgor Psychiatric: appropriate judgment and insight, oriented to person, place, and time   Lab Results:  Recent Labs    07/12/21 0059 07/13/21 0317  WBC 4.2 3.6*  HGB 8.7* 8.5*  HCT 26.0* 25.5*  PLT 229 229   BMET Recent Labs    07/11/21 0150 07/13/21 0317  NA 131* 132*  K 4.2 4.1  CL 103 102  CO2 24 21*  GLUCOSE 94 87  BUN 29* 28*  CREATININE 1.29* 1.16  CALCIUM 7.8* 8.3*   PT/INR Recent Labs    07/13/21 0317  LABPROT 13.7  INR 1.1  Anti-infectives: Anti-infectives (From admission, onward)    Start     Dose/Rate Route Frequency Ordered Stop   07/10/21 0630  cephALEXin (KEFLEX) capsule 500 mg        500 mg Oral 3 times daily before meals & bedtime 07/10/21 1610 07/15/21 9604       Assessment/Plan: OR 1/3 (Dr. Kae Heller) s/p ex lap with segmental TC resection for adenocarcinoma - surgical path T2n0 adenocarcinoma - regular diet - PT/OT - rec  -Dc staples Acute Blood Loss Anemia after surgery- started BID FeSO4 + Vit C with  meals. - likely intermittent oozing from anastomosis due to anticoagulation requirement. Abdominal exam is benign. -no further bleeding at this time.  Bridging to Coumadin -Hgb 8.7<--8.2<--9.8<--8.8.  no further bleeding or bloody BMs since 1/12. On iron, stools will be black. Spears for PO anticoagulation, wean heparin as able   FEN - reg diet VTE - heparin restarted 1/12 ID - none currently Foley - out 1/5 and voiding     A fib MVR on coumadin CKD   LOS: 19 days    Richard Spears 07/13/2021

## 2021-07-13 NOTE — Progress Notes (Signed)
PROGRESS NOTE                                                                                                                                                                                                             Patient Demographics:    Richard Spears, is a 88 y.o. 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  Currently on heparin drip for MVR.   Subjective:   Patient still with no bowel movement overnight, this makes it up to 4 days, he denies any chest pain, shortness of breath, nausea or vomiting.    Assessment  & Plan :    Acute lower GI bleed with colonoscopy done on 06/27/2021 showing large transverse colon mass with acute blood loss related anemia  - lower GI bleed- he received 1 unit of packed RBC transfusion earlier this admission, seen by GI and general surgery, he underwent colonoscopy and which was positive for colon mass biopsy suggestive of poorly differentiated adenocarcinoma, he was subsequently seen by general surgery and underwent surgical resection of the mass on 06/30/2021 by general surgery.   -Very tenuous situation, as he remains with recurrent acute blood loss anemia, likely from bleeding at anastomosis site while on anticoagulation. -Appears to be no further bleeding over the last 4 days, H&H remained stable, no BM yet, will give more laxatives today, he is back on  warfarin, meanwhile he is being bridged with heparin drip.   -Continue with iron pills, may cause stool to be black.  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   - Present on admission.  S/p colon resection .,  Advance to regular diet currently, tolerating very well.   CKD 3B.  Baseline creatinine around 1.5.  Monitor closely.  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  Resolved for now.  MVR.   - Coagulation on hold due  above, now he is with evidence with recurrent bleed, I will go ahead and hold his heparin GTT, likely need to hold for another 1 to 2 days till his hemoglobin has stabilized and no further evidence of GI bleed.  -I have discussed with the patient and the son, there is risk involved either way, but at this point given his active bleed we will go ahead and hold his anticoagulation for next 24 to 48 hours.  Paroxysmal A. fib [place] vas 2 score of 3.  See above for anticoagulation.  on low-dose Lopressor and monitor.  Right arm cellulitis/acute superficial cephalic vein thrombosis -Started on Keflex, warm compress as well at it started at around IV site Ultrasound significant for superficial vein thrombosis        Condition - Fair  Family Communication  : [name] [phone] updated 07/08/2021 , 1/16,   Code Status :  DNR  Consults  : General surgery, GI, Oncology,  PUD Prophylaxis :     Procedures  :     Colonoscopy done on 06/27/2021 showing large transverse colon mass.  Colon resection by general surgery on 06/30/2021      Disposition Plan  :    Status is: Inpatient  Remains inpatient appropriate because: Heparin drip, MVR, colon cancer   DVT Prophylaxis  : Heparin drip intermittently, SCD  Place and maintain sequential compression device Start: 07/09/21 1330 Place and maintain sequential compression device Start: 07/04/21 1358 SCDs Start: 06/24/21 2115     Lab Results  Component Value Date   PLT 229 07/13/2021     Diet :  Diet Order             Diet regular Room service appropriate? Yes; Fluid consistency: Thin  Diet effective now                    Inpatient Medications  Scheduled Meds:  acetaminophen  1,000 mg Oral Q6H   Or   acetaminophen  650 mg Rectal Q6H   vitamin C  250 mg Oral BID WC   bisacodyl  10 mg Oral Once   cephALEXin  500 mg Oral TID AC & HS   docusate sodium  100 mg Oral BID   feeding supplement  1 Container Oral TID BM   ferrous sulfate  325 mg Oral BID WC   metoprolol tartrate  25 mg Oral BID   pantoprazole  40 mg Intravenous Q12H   polyethylene glycol  34 g Oral Daily   polyethylene glycol  34 g Oral Once   sodium chloride flush  3 mL Intravenous Q12H   Warfarin - Pharmacist Dosing Inpatient   Does not apply q1600   Continuous Infusions:  heparin 1,300 Units/hr (07/13/21 0700)    PRN Meds:.fentaNYL (SUBLIMAZE) injection, LORazepam, ondansetron (ZOFRAN) IV, oxyCODONE  Antibiotics  :    Anti-infectives (From admission, onward)    Start     Dose/Rate Route Frequency Ordered Stop   07/10/21 0630  cephALEXin (KEFLEX) capsule 500 mg        500 mg Oral 3 times daily before meals & bedtime 07/10/21 0619 07/15/21 0629         Emeline Gins Ronna Herskowitz M.D on 07/13/2021 at 12:16 PM  To page go to [email or web address]   [facility] -  Office  [phone]  See all Orders from today for further details    Objective:   Vitals:  07/12/21 1634 07/12/21 2011 07/12/21 2348 07/13/21 0900  BP: (!) 88/44 (!) 126/45 (!) 121/59 (!) 151/65  Pulse:  (!) 56 60   Resp:  17 18 17   Temp: 98.2 F (36.8 C) 97.7 F (36.5 C) 98.6 F (37 C) 97.7 F (36.5 C)  TempSrc: Oral Oral Oral Oral  SpO2: 97% 96% 98%   Weight:      Height:        Wt Readings from Last 3 Encounters:  06/27/21 68 kg  04/28/21 68.1 kg  03/06/21 72 kg     Intake/Output Summary (Last 24 hours) at 07/13/2021 1216 Last data filed at 07/13/2021 0700 Gross per 24 hour  Intake 287.35 ml  Output  1100 ml  Net -812.65 ml     Physical Exam   Awake Alert, Oriented X 3, frail, thin appearing Symmetrical Chest wall movement, Good air movement bilaterally, CTAB RRR,No Gallops,Rubs , has mechanical valve click +ve B.Sounds, Abd Soft, No tenderness, No rebound - guarding or rigidity. No Cyanosis, Clubbing or edema, No new Rash or bruise, right arm cellulitis significantly improved.     Data Review:    CBC Recent Labs  Lab 07/07/21 0812 07/07/21 1440 07/08/21 0110 07/08/21 0617 07/09/21 0123 07/09/21 1304 07/10/21 0506 07/10/21 1345 07/11/21 0150 07/11/21 1427 07/12/21 0059 07/13/21 0317  WBC 4.5 5.4 4.4   < > 4.4   < > 3.8* 3.9* 4.0 3.5* 4.2 3.6*  HGB 8.3* 8.3* 7.0*   < > 8.7*   < > 8.8* 9.0* 8.2* 8.8* 8.7* 8.5*  HCT 25.0* 25.4* 22.1*   < > 25.3*   < > 26.3* 27.9* 25.9* 27.1* 26.0* 25.5*  PLT 162 179 136*   < > 196   < > 173 181 180 206 229 229  MCV 94.0 93.7 95.7   < > 90.7   < > 93.3 94.3 95.6 93.8 94.9 95.5  MCH 31.2 30.6 30.3   < > 31.2   < > 31.2 30.4 30.3 30.4 31.8 31.8  MCHC 33.2 32.7 31.7   < > 34.4   < > 33.5 32.3 31.7 32.5 33.5 33.3  RDW 19.5* 19.9* 19.6*   < > 18.8*   < > 18.8* 19.2* 19.3* 18.8* 19.0* 19.2*  LYMPHSABS 0.6* 0.6* 0.7  --  0.5*  --  0.8  --   --   --   --   --   MONOABS 0.4 0.5 0.4  --  0.2  --  0.4  --   --   --   --   --   EOSABS 0.3 0.2 0.3  --  0.2  --  0.2  --   --   --   --   --   BASOSABS 0.0 0.0 0.0  --  0.0  --  0.0  --   --   --   --   --    < > = values in this interval not displayed.    Electrolytes Recent Labs  Lab 07/07/21 0113 07/08/21 0110 07/09/21 0123 07/10/21 0506 07/11/21 0150 07/13/21 0317  NA 134* 132* 131* 130* 131* 132*  K 4.1 4.7 4.0 4.2 4.2 4.1  CL 107 105 101 101 103 102  CO2 21* 21* 23 23 24  21*  GLUCOSE 96 94 104* 86 94 87  BUN 24* 31* 30* 28* 29* 28*  CREATININE 1.24 1.12 1.20 1.28* 1.29* 1.16  CALCIUM 7.7* 7.8* 7.8* 8.1* 7.8* 8.3*  AST 19 22 26  35  --   --  ALT 14 14 18 23   --   --   ALKPHOS  44 44 44 45  --   --   BILITOT 0.7 0.5 0.5 0.6  --   --   ALBUMIN 2.0* 1.9* 2.0* 2.1*  --   --   MG 1.9 1.8 1.9 1.9  --   --   INR 1.3* 1.3* 1.1  --   --  1.1  BNP 530.9* 437.7* 451.0*  --   --   --     ------------------------------------------------------------------------------------------------------------------ No results for input(s): CHOL, HDL, LDLCALC, TRIG, CHOLHDL, LDLDIRECT in the last 72 hours.  No results found for: HGBA1C  No results for input(s): TSH, T4TOTAL, T3FREE, THYROIDAB in the last 72 hours.  Invalid input(s): FREET3 ------------------------------------------------------------------------------------------------------------------ ID Labs Recent Labs  Lab 07/08/21 0110 07/08/21 0617 07/09/21 0123 07/09/21 1304 07/10/21 0506 07/10/21 1345 07/11/21 0150 07/11/21 1427 07/12/21 0059 07/13/21 0317  WBC 4.4   < > 4.4   < > 3.8* 3.9* 4.0 3.5* 4.2 3.6*  PLT 136*   < > 196   < > 173 181 180 206 229 229  CREATININE 1.12  --  1.20  --  1.28*  --  1.29*  --   --  1.16   < > = values in this interval not displayed.   Cardiac Enzymes No results for input(s): CKMB, TROPONINI, MYOGLOBIN in the last 168 hours.  Invalid input(s): CK   Radiology Reports ECHOCARDIOGRAM COMPLETE  Result Date: 07/10/2021    ECHOCARDIOGRAM REPORT   Patient Name:   KYNG MATLOCK [facility] Date of Exam: 07/10/2021 Medical Rec #:  [id number]           Height:       72.0 in Accession #:    [id number]          Weight:       149.9 lb Date of Birth:  25-Aug-1925           BSA:          1.885 m Patient Age:    63 years            BP:           142/78 mmHg Patient Gender: M                   HR:           96 bpm. Exam Location:  Inpatient Procedure: 2D Echo Indications:    Mitral valve replacement  History:        Patient has prior history of Echocardiogram examinations, most                 recent 01/18/2019. Aortic Valve Disease; Risk                 Factors:Dyslipidemia. Mitral valve replacement.   Sonographer:    Arlyss Gandy Referring Phys: [place] Comments: Patient supine IMPRESSIONS  1. Left ventricular ejection fraction, by estimation, is 55 to 60%. The left ventricle has normal function. The left ventricle has no regional wall motion abnormalities. There is mild concentric left ventricular hypertrophy. Diastolic function is indeterminant due to Afib.  2. Right ventricular systolic function is mildly reduced. The right ventricular size is mildly enlarged. There is normal pulmonary artery systolic pressure. The estimated right ventricular systolic pressure is 14.9 mmHg.  3. Left atrial size was severely dilated.  4. Right atrial size was severely dilated.  5. The mitral valve  has been repaired/replaced. Patient is s/p St Jude bileaflet mechanical valve replacement. Echo findings are consistent with normal structure and function of the mitral valve prosthesis. Mean gradient is 37mmHg at 99bpm.  6. The aortic valve is tricuspid. There is mild calcification of the aortic valve. There is mild thickening of the aortic valve. Aortic valve regurgitation is moderate.  7. Aortic dilatation noted. There is borderline dilatation of the aortic root, measuring 37 mm.  8. The inferior vena cava is normal in size with <50% respiratory variability, suggesting right atrial pressure of 8 mmHg. Comparison(s): Compared to prior TTE in 2020, the AR now appears moderate. Otherwise no significant change. Mean mitral valve gradient stable at ~5-30mmHg. FINDINGS  Left Ventricle: Left ventricular ejection fraction, by estimation, is 55 to 60%. The left ventricle has normal function. The left ventricle has no regional wall motion abnormalities. The left ventricular internal cavity size was normal in size. There is  mild concentric left ventricular hypertrophy. Abnormal (paradoxical) septal motion consistent with post-operative status. Diastolic function is indeterminant due to Afib. Right Ventricle: The right  ventricular size is mildly enlarged. No increase in right ventricular wall thickness. Right ventricular systolic function is mildly reduced. There is normal pulmonary artery systolic pressure. The tricuspid regurgitant velocity  is 2.76 m/s, and with an assumed right atrial pressure of 3 mmHg, the estimated right ventricular systolic pressure is 94.8 mmHg. Left Atrium: Left atrial size was severely dilated. Right Atrium: Right atrial size was severely dilated. Pericardium: There is no evidence of pericardial effusion. Mitral Valve: Mean gradient 15mmHg at HR 99bpm. The mitral valve has been repaired/replaced. There is a St. Jude present in the mitral position. Echo findings are consistent with normal structure and function of the mitral valve prosthesis. MV peak gradient, 14.4 mmHg. The mean mitral valve gradient is 6.0 mmHg. Tricuspid Valve: The tricuspid valve is normal in structure. Tricuspid valve regurgitation is mild. Aortic Valve: The aortic valve is tricuspid. There is mild calcification of the aortic valve. There is mild thickening of the aortic valve. Aortic valve regurgitation is moderate. Aortic regurgitation PHT measures 364 msec. Aortic valve mean gradient measures 5.5 mmHg. Aortic valve peak gradient measures 10.1 mmHg. Aortic valve area, by VTI measures 1.94 cm. Pulmonic Valve: The pulmonic valve was normal in structure. Pulmonic valve regurgitation is trivial. Aorta: Aortic dilatation noted. There is borderline dilatation of the aortic root, measuring 37 mm. Venous: The inferior vena cava is normal in size with less than 50% respiratory variability, suggesting right atrial pressure of 8 mmHg. IAS/Shunts: The atrial septum is grossly normal.  LEFT VENTRICLE PLAX 2D LVIDd:         4.20 cm   Diastology LVIDs:         3.10 cm   LV e' medial:    9.30 cm/s LV PW:         1.30 cm   LV E/e' medial:  14.7 LV IVS:        1.30 cm   LV e' lateral:   12.80 cm/s LVOT diam:     2.10 cm   LV E/e' lateral: 10.7 LV  SV:         53 LV SV Index:   28 LVOT Area:     3.46 cm  RIGHT VENTRICLE             IVC RV Basal diam:  4.10 cm     IVC diam: 1.80 cm RV S prime:     10.70  cm/s TAPSE (M-mode): 1.6 cm LEFT ATRIUM              Index        RIGHT ATRIUM           Index LA diam:        5.00 cm  2.65 cm/m   RA Area:     29.70 cm LA Vol (A2C):   102.0 ml 54.11 ml/m  RA Volume:   98.90 ml  52.47 ml/m LA Vol (A4C):   103.0 ml 54.64 ml/m LA Biplane Vol: 104.0 ml 55.18 ml/m  AORTIC VALVE AV Area (Vmax):    1.98 cm AV Area (Vmean):   1.95 cm AV Area (VTI):     1.94 cm AV Vmax:           159.00 cm/s AV Vmean:          108.350 cm/s AV VTI:            0.274 m AV Peak Grad:      10.1 mmHg AV Mean Grad:      5.5 mmHg LVOT Vmax:         90.95 cm/s LVOT Vmean:        61.150 cm/s LVOT VTI:          0.154 m LVOT/AV VTI ratio: 0.56 AI PHT:            364 msec  AORTA Ao Root diam: 3.70 cm Ao Asc diam:  3.10 cm MITRAL VALVE                TRICUSPID VALVE MV Area (PHT): 2.87 cm     TR Peak grad:   30.5 mmHg MV Area VTI:   1.60 cm     TR Vmax:        276.00 cm/s MV Peak grad:  14.4 mmHg MV Mean grad:  6.0 mmHg     SHUNTS MV Vmax:       1.90 m/s     Systemic VTI:  0.15 m MV Vmean:      112.0 cm/s   Systemic Diam: 2.10 cm MV Decel Time: 264 msec MV E velocity: 137.00 cm/s MV A velocity: 0.00 cm/s Gwyndolyn Kaufman MD Electronically signed by Gwyndolyn Kaufman MD Signature Date/Time: 07/10/2021/3:10:59 PM    Final    VAS [place] UPPER EXTREMITY VENOUS DUPLEX  Result Date: 07/10/2021 UPPER VENOUS STUDY  Patient Name:  AZARIAH BONURA  Date of Exam:   07/10/2021 Medical Rec #: [id number]            Accession #:    [id number] Date of Birth: July 01, 1925            Patient Gender: M Patient Age:   24 years Exam Location:  [facility] Procedure:      VAS [place] UPPER EXTREMITY VENOUS DUPLEX Referring Phys: Inda Merlin --------------------------------------------------------------------------------  Indications: Pain, Swelling, and Erythema  Comparison Study: No prior study Performing Technologist: Maudry Mayhew MHA, RDMS, RVT, RDCS  Examination Guidelines: A complete evaluation includes B-mode imaging, spectral Doppler, color Doppler, and power Doppler as needed of all accessible portions of each vessel. Bilateral testing is considered an integral part of a complete examination. Limited examinations for reoccurring indications may be performed as noted.  Right Findings: +----------+------------+---------+-----------+----------+--------------------+  RIGHT      Compressible Phasicity Spontaneous Properties       Summary         +----------+------------+---------+-----------+----------+--------------------+  IJV  Full        Yes        Yes                                      +----------+------------+---------+-----------+----------+--------------------+  Subclavian     Full        Yes        Yes                                      +----------+------------+---------+-----------+----------+--------------------+  Axillary       Full        Yes        Yes                                      +----------+------------+---------+-----------+----------+--------------------+  Brachial       Full        Yes        Yes                                      +----------+------------+---------+-----------+----------+--------------------+  Radial         Full                                                            +----------+------------+---------+-----------+----------+--------------------+  Ulnar          Full                                                            +----------+------------+---------+-----------+----------+--------------------+  Cephalic       None                                       Acute, mid forearm                                                                     only          +----------+------------+---------+-----------+----------+--------------------+  Basilic        Full                                                             +----------+------------+---------+-----------+----------+--------------------+  Left Findings: +----------+------------+---------+-----------+----------+-------+  LEFT       Compressible Phasicity Spontaneous  Properties Summary  +----------+------------+---------+-----------+----------+-------+  Subclavian                 Yes        Yes                         +----------+------------+---------+-----------+----------+-------+  Summary:  Right: No evidence of deep vein thrombosis in the upper extremity. Findings consistent with acute superficial vein thrombosis involving the right cephalic vein.  Left: No evidence of thrombosis in the subclavian.  *See table(s) above for measurements and observations.  Diagnosing physician: Jamelle Haring Electronically signed by Jamelle Haring on 07/10/2021 at 2:49:56 PM.    Final      Patient ID: [name], male   DOB: 02/08/1926, 88 y.o.   MRN: [id number] Patient ID: MECHEL SCHUTTER, male   DOB: May 11, 1926, 88 y.o.   MRN: [id number] Patient ID: ARBOR COHEN, male   DOB: January 24, 1926, 88 y.o.   MRN: [id number] Patient ID: FREDERIK STANDLEY, male   DOB: 08-28-1925, 88 y.o.   MRN: [id number] Patient ID: HADDON FYFE, male   DOB: 02/14/1926, 88 y.o.   MRN: [id number] Patient ID: AB LEAMING, male   DOB: 08/17/1925, 88 y.o.   MRN: [id number]

## 2021-07-13 NOTE — Progress Notes (Signed)
ANTICOAGULATION CONSULT NOTE - Follow Up Consult  Pharmacy Consult for Heparin Indication:  mechanical mitral valve and atrial fibrillation  Allergies  Allergen Reactions   Flexeril [Cyclobenzaprine] Diarrhea    Patient Measurements: Height: 6' (182.9 cm) Weight: 68 kg (149 lb 14.6 oz) IBW/kg (Calculated) : 77.6 Heparin Dosing Weight: 68 kg  Vital Signs: Temp: 97.8 F (36.6 C) (01/16 1235) Temp Source: Oral (01/16 1235) BP: 97/59 (01/16 1235) Pulse Rate: 75 (01/16 1235)  Labs: Recent Labs    07/11/21 0150 07/11/21 1427 07/12/21 0059 07/13/21 0317  HGB 8.2* 8.8* 8.7* 8.5*  HCT 25.9* 27.1* 26.0* 25.5*  PLT 180 206 229 229  LABPROT  --   --   --  13.7  INR  --   --   --  1.1  HEPARINUNFRC 0.41  --  0.55 0.48  CREATININE 1.29*  --   --  1.16     Estimated Creatinine Clearance: 38.3 mL/min (by C-G formula based on SCr of 1.16 mg/dL).  Assessment: 88 yo M admitted with GIB on 12/28. PTA warfarin (LD 12/28 @ 0730) for mechanical mitral valve and atrial fibrillation. INR 3.9 on admission > IV vitamin k 5mg  12/29 > INR 1.2 on recheck. Pharmacy consulted to start IV heparin while warfarin is on hold.  Pharmacy consulted to resume Warfarin on 07/12/21.  On 07/13/21: Heparin level is 0.48 , is therapeutic on heparin drip rate 1300 units/hr.  Goal HL =  0.3-0.5 due to acute lower GIB this admit, s/p colonoscopy 06/27/21. S/p partial colectomy on 06/30/21.  h/o chronic anemia,Hgb  8.2 >8.7>8.5 INR 1.1 , goal INR 2.5-3.5 for mechanical mitral valve and Afib.    POD #13 s/p ex lap with segmental TC resection for adenocarcinoma 06/30/20.  -  no further bleeding over the last 4 days, H/H stable.    Goal of Therapy:  INR goal  2.5-3.5 Heparin level 0.3-0.5 units/ml Monitor platelets by anticoagulation protocol: Yes   Plan:  Continue IV heparin at 1300 units/hr Warfarin 7.5mg  x 1 Daily heparin level and CBC  Thank you for allowing pharmacy to participate in this patient's  care.  Nicole Cella, RPh Clinical Pharmacist [phone]  07/13/2021,1:39 PM Please check AMION for all [place] phone numbers After 10:00 PM, call [place] [phone]

## 2021-07-14 LAB — PROTIME-INR
INR: 1.2 (ref 0.8–1.2)
Prothrombin Time: 15.1 seconds (ref 11.4–15.2)

## 2021-07-14 LAB — CBC
HCT: 25.6 % — ABNORMAL LOW (ref 39.0–52.0)
Hemoglobin: 8 g/dL — ABNORMAL LOW (ref 13.0–17.0)
MCH: 30.1 pg (ref 26.0–34.0)
MCHC: 31.3 g/dL (ref 30.0–36.0)
MCV: 96.2 fL (ref 80.0–100.0)
Platelets: 225 10*3/uL (ref 150–400)
RBC: 2.66 MIL/uL — ABNORMAL LOW (ref 4.22–5.81)
RDW: 19.3 % — ABNORMAL HIGH (ref 11.5–15.5)
WBC: 4.9 10*3/uL (ref 4.0–10.5)
nRBC: 0 % (ref 0.0–0.2)

## 2021-07-14 LAB — PREPARE RBC (CROSSMATCH)

## 2021-07-14 LAB — HEPARIN LEVEL (UNFRACTIONATED): Heparin Unfractionated: 0.52 IU/mL (ref 0.30–0.70)

## 2021-07-14 MED ORDER — WARFARIN SODIUM 6 MG PO TABS
6.0000 mg | ORAL_TABLET | Freq: Once | ORAL | Status: AC
  Administered 2021-07-14: 6 mg via ORAL
  Filled 2021-07-14: qty 1

## 2021-07-14 MED ORDER — SODIUM CHLORIDE 0.9% IV SOLUTION: Freq: Once | INTRAVENOUS | Status: AC

## 2021-07-14 NOTE — Progress Notes (Signed)
ANTICOAGULATION CONSULT NOTE - Follow Up Consult  Pharmacy Consult for Heparin bridge to Warfarin Indication:  mechanical mitral valve and atrial fibrillation  Allergies  Allergen Reactions   Flexeril [Cyclobenzaprine] Diarrhea    Patient Measurements: Height: 6' (182.9 cm) Weight: 68 kg (149 lb 14.6 oz) IBW/kg (Calculated) : 77.6 Heparin Dosing Weight: 68 kg  Vital Signs: Temp: 97.6 F (36.4 C) (01/17 0739) Temp Source: Oral (01/17 0739) BP: 172/70 (01/17 0739) Pulse Rate: 70 (01/17 0739)  Labs: Recent Labs    07/12/21 0059 07/13/21 0317 07/14/21 0115  HGB 8.7* 8.5* 8.0*  HCT 26.0* 25.5* 25.6*  PLT 229 229 225  LABPROT  --  13.7 15.1  INR  --  1.1 1.2  HEPARINUNFRC 0.55 0.48 0.52  CREATININE  --  1.16  --      Estimated Creatinine Clearance: 38.3 mL/min (by C-G formula based on SCr of 1.16 mg/dL).  Assessment: 88 yo M admitted with GIB on 12/28. PTA warfarin (LD 12/28 @ 0730) for mechanical mitral valve and atrial fibrillation. INR 3.9 on admission > IV vitamin k 5mg  12/29 > INR 1.2 on recheck. Pharmacy consulted to start IV heparin while warfarin is on hold.   POD #14 s/p ex lap with segmental TC resection for adenocarcinoma 06/30/20.   Pharmacy consulted to resume Warfarin on 07/12/21.   On 07/14/21: Heparin level is 0.52 , is therapeutic /just slightly above 0.5 on heparin drip rate 1300 units/hr.   Will reduce heparin rate today by 50 units/hr. Goal HL =  0.3-0.5 due to acute lower GIB this admit, s/p colonoscopy 06/27/21. S/p partial colectomy on 06/30/21.  h/o chronic anemia, Hgb  8.2 >8.7>8.5>8.0  pltc 225k normal/stable.  INR 1.2 , goal INR 2.5-3.5 for mechanical mitral valve and Afib.  Warfarin resumed 1/15. No further bleeding or bloody BMs since 1/12.  PTA warfarin dose:  pt reported last time INR consistently therapeutic was on 5mg  daily.   Goal of Therapy:  INR goal  2.5-3.5 Heparin level 0.3-0.5 units/ml Monitor platelets by anticoagulation  protocol: Yes   Plan:  Decrease IV heparin at 1250 units/hr Warfarin 6 mg x 1 Daily heparin level and CBC  Thank you for allowing pharmacy to participate in this patient's care.  Nicole Cella, RPh Clinical Pharmacist [phone]  07/14/2021,8:48 AM Please check AMION for all [place] phone numbers After 10:00 PM, call [place] [phone]

## 2021-07-14 NOTE — Progress Notes (Signed)
PROGRESS NOTE                                                                                                                                                                                                             Patient Demographics:    Richard Spears, is a 88 y.o. male, DOB - [birth year], WUX:[id number]  Outpatient Primary MD for the patient is [name], MD    LOS - 39  Admit date - 06/24/2021    Chief Complaint  Patient presents with   GI Problem       Brief Narrative (HPI from H&P)     - 89/M from independent living with spouse with history of paroxysmal atrial fibrillation and mechanical mitral valve replacement with [employer]'s metallic valve on Coumadin, chronic anemia, hypertension, chronic diastolic CHF, presented to the ED with 2 episodes of hematochezia, further work-up showed large colon mass significant for malignancy underwent colon resection.  Currently on heparin drip for MVR.  Hospital stay was prolonged by acute blood loss anemia due to anastomosis site in the setting of anticoagulation.   Subjective:   Patient had couple bowel movements yesterday, he denies any chest pain or shortness of breath.      Assessment  & Plan :    Acute lower GI bleed with colonoscopy done on 06/27/2021 showing large transverse colon mass with acute blood loss related anemia  - lower GI bleed- he received 1 unit of packed RBC transfusion earlier this admission, seen by GI and general surgery, he underwent colonoscopy and which was positive for colon mass biopsy suggestive of poorly differentiated adenocarcinoma, he was subsequently seen by general surgery and underwent surgical resection of the mass on 06/30/2021 by general surgery.   -Very tenuous situation, as he remains with recurrent acute blood loss anemia, likely from bleeding at anastomosis site while on anticoagulation. -Appears to be no further bleeding over  the last 5 days, all hemoglobin is stable, continue with heparin GTT, he is back on warfarin, being bridged by heparin GTT till INR is therapeutic. -Continue with iron pills, may cause stool to be black. -Hemoglobin is 8 this morning, I will transfuse 1 unit PRBC, currently patient does not have any evidence of active GI bleed, but will proceed his transfusion given his known history of CAD, where he remains on full anticoagulation  and recent surgery.  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   - Present on admission.  S/p colon resection .,  Advance to regular diet currently, tolerating very well.   CKD 3B.  Baseline creatinine around 1.5.  Monitor closely.  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  Resolved for now.  MVR.   -Currently on heparin GTT until INR is therapeutic.  Paroxysmal A. fib [place] vas 2 score of 3.  See above for anticoagulation.  on low-dose Lopressor and monitor.  Right arm cellulitis/acute superficial cephalic vein thrombosis -Started on Keflex, warm compress as well at it started at around IV site Ultrasound significant for superficial vein thrombosis        Condition - Fair  Family Communication  : [name] [phone] updated 07/08/2021 , 1/16,   Code Status :  DNR  Consults  : General surgery, GI, Oncology,  PUD Prophylaxis :     Procedures  :     Colonoscopy done on 06/27/2021 showing large transverse colon mass.  Colon resection by general surgery on 06/30/2021      Disposition Plan  :    Status is: Inpatient  Remains inpatient appropriate because: Heparin drip, MVR, colon cancer   DVT Prophylaxis  : Heparin drip intermittently, SCD  Place and maintain sequential compression device Start: 07/09/21 1330 Place and maintain sequential compression device Start: 07/04/21 1358 SCDs Start: 06/24/21 2115 warfarin (COUMADIN) tablet 6 mg     Lab Results  Component Value Date   PLT 225 07/14/2021    Diet :   Diet Order             Diet regular Room service appropriate? Yes; Fluid consistency: Thin  Diet effective now                    Inpatient Medications  Scheduled Meds:  acetaminophen  1,000 mg Oral Q6H   Or   acetaminophen  650 mg Rectal Q6H   vitamin C  250 mg Oral BID WC   cephALEXin  500 mg Oral TID AC & HS   docusate sodium  100 mg Oral BID   feeding supplement  1 Container Oral TID BM   ferrous sulfate  325 mg Oral BID WC   metoprolol tartrate  25 mg Oral BID   pantoprazole  40 mg Intravenous Q12H   polyethylene glycol  34 g Oral Daily   sodium chloride flush  3 mL Intravenous Q12H   warfarin  6 mg Oral ONCE-1600   Warfarin - Pharmacist Dosing Inpatient   Does not apply q1600   Continuous Infusions:  heparin 1,250 Units/hr (07/14/21 0959)    PRN Meds:.fentaNYL (SUBLIMAZE) injection, LORazepam, ondansetron (ZOFRAN) IV, oxyCODONE  Antibiotics  :    Anti-infectives (From admission, onward)    Start     Dose/Rate Route Frequency Ordered Stop   07/10/21 0630  cephALEXin (KEFLEX) capsule 500 mg        500 mg Oral 3 times daily before meals & bedtime 07/10/21 0619 07/15/21 0629         Emeline Gins Ajani Rineer M.D on 07/14/2021 at 2:05 PM  To page go to [email or web address]   [facility] -  Office  [phone]  See all Orders from today for further details    Objective:   Vitals:   07/14/21 0041 07/14/21 0416 07/14/21 0739 07/14/21 1211  BP: 135/66 (!) 148/60 (!) 172/70 133/79  Pulse: 89 66 70 73  Resp: 18 18 20  17  Temp: 98.3 F (36.8 C) 98.4 F (36.9 C) 97.6 F (36.4 C) 98.3 F (36.8 C)  TempSrc: Oral Oral Oral Oral  SpO2:   100% 98%  Weight:      Height:        Wt Readings from Last 3 Encounters:  06/27/21 68 kg  04/28/21 68.1 kg  03/06/21 72 kg     Intake/Output Summary (Last 24 hours) at 07/14/2021 1405 Last data filed at 07/14/2021 0418 Gross per 24 hour  Intake --  Output 400 ml  Net -400 ml     Physical Exam   Awake Alert,  Oriented X 3, frail, thin appearing Symmetrical Chest wall movement, Good air movement bilaterally, CTAB RRR,No Gallops,Rubs, mechanical click +ve B.Sounds, Abd Soft, No tenderness, No rebound - guarding or rigidity. No Cyanosis, Clubbing or edema, No new Rash or bruise        Data Review:    CBC Recent Labs  Lab 07/07/21 1440 07/08/21 0110 07/08/21 0617 07/09/21 0123 07/09/21 1304 07/10/21 0506 07/10/21 1345 07/11/21 0150 07/11/21 1427 07/12/21 0059 07/13/21 0317 07/14/21 0115  WBC 5.4 4.4   < > 4.4   < > 3.8*   < > 4.0 3.5* 4.2 3.6* 4.9  HGB 8.3* 7.0*   < > 8.7*   < > 8.8*   < > 8.2* 8.8* 8.7* 8.5* 8.0*  HCT 25.4* 22.1*   < > 25.3*   < > 26.3*   < > 25.9* 27.1* 26.0* 25.5* 25.6*  PLT 179 136*   < > 196   < > 173   < > 180 206 229 229 225  MCV 93.7 95.7   < > 90.7   < > 93.3   < > 95.6 93.8 94.9 95.5 96.2  MCH 30.6 30.3   < > 31.2   < > 31.2   < > 30.3 30.4 31.8 31.8 30.1  MCHC 32.7 31.7   < > 34.4   < > 33.5   < > 31.7 32.5 33.5 33.3 31.3  RDW 19.9* 19.6*   < > 18.8*   < > 18.8*   < > 19.3* 18.8* 19.0* 19.2* 19.3*  LYMPHSABS 0.6* 0.7  --  0.5*  --  0.8  --   --   --   --   --   --   MONOABS 0.5 0.4  --  0.2  --  0.4  --   --   --   --   --   --   EOSABS 0.2 0.3  --  0.2  --  0.2  --   --   --   --   --   --   BASOSABS 0.0 0.0  --  0.0  --  0.0  --   --   --   --   --   --    < > = values in this interval not displayed.    Electrolytes Recent Labs  Lab 07/08/21 0110 07/09/21 0123 07/10/21 0506 07/11/21 0150 07/13/21 0317 07/14/21 0115  NA 132* 131* 130* 131* 132*  --   K 4.7 4.0 4.2 4.2 4.1  --   CL 105 101 101 103 102  --   CO2 21* 23 23 24  21*  --   GLUCOSE 94 104* 86 94 87  --   BUN 31* 30* 28* 29* 28*  --   CREATININE 1.12 1.20 1.28* 1.29* 1.16  --   CALCIUM 7.8* 7.8* 8.1*  7.8* 8.3*  --   AST 22 26 35  --   --   --   ALT 14 18 23   --   --   --   ALKPHOS 44 44 45  --   --   --   BILITOT 0.5 0.5 0.6  --   --   --   ALBUMIN 1.9* 2.0* 2.1*  --   --   --    MG 1.8 1.9 1.9  --   --   --   INR 1.3* 1.1  --   --  1.1 1.2  BNP 437.7* 451.0*  --   --   --   --     ------------------------------------------------------------------------------------------------------------------ No results for input(s): CHOL, HDL, LDLCALC, TRIG, CHOLHDL, LDLDIRECT in the last 72 hours.  No results found for: HGBA1C  No results for input(s): TSH, T4TOTAL, T3FREE, THYROIDAB in the last 72 hours.  Invalid input(s): FREET3 ------------------------------------------------------------------------------------------------------------------ ID Labs Recent Labs  Lab 07/08/21 0110 07/08/21 0617 07/09/21 0123 07/09/21 1304 07/10/21 0506 07/10/21 1345 07/11/21 0150 07/11/21 1427 07/12/21 0059 07/13/21 0317 07/14/21 0115  WBC 4.4   < > 4.4   < > 3.8*   < > 4.0 3.5* 4.2 3.6* 4.9  PLT 136*   < > 196   < > 173   < > 180 206 229 229 225  CREATININE 1.12  --  1.20  --  1.28*  --  1.29*  --   --  1.16  --    < > = values in this interval not displayed.   Cardiac Enzymes No results for input(s): CKMB, TROPONINI, MYOGLOBIN in the last 168 hours.  Invalid input(s): CK   Radiology Reports No results found.   Patient ID: Richard Spears, male   DOB: 06/24/1926, 88 y.o.   MRN: [id number] Patient ID: Richard Spears, male   DOB: 1926/04/09, 88 y.o.   MRN: [id number] Patient ID: Richard Spears, male   DOB: May 26, 1926, 88 y.o.   MRN: [id number] Patient ID: Richard Spears, male   DOB: [birth year], 88 y.o.   MRN: [id number] Patient ID: Richard Spears, male   DOB: 1926/05/30, 88 y.o.   MRN: [id number] Patient ID: Richard Spears, male   DOB: June 25, 1926, 88 y.o.   MRN: [id number] Patient ID: Richard Spears, male   DOB: 12/22/1925, 88 y.o.   MRN: [id number]

## 2021-07-14 NOTE — Progress Notes (Signed)
14 Days Post-Op   Subjective/Chief Complaint: PT states BMs yesterday with no signs of blood.   Objective: Vital signs in last 24 hours: Temp:  [97.6 F (36.4 C)-98.4 F (36.9 C)] 97.6 F (36.4 C) (01/17 0739) Pulse Rate:  [66-89] 70 (01/17 0739) Resp:  [17-20] 20 (01/17 0739) BP: (97-172)/(59-71) 172/70 (01/17 0739) SpO2:  [97 %-100 %] 100 % (01/17 0739) Last BM Date: 07/10/21  Intake/Output from previous day: 01/16 0701 - 01/17 0700 In: 240 [P.O.:240] Out: 775 [Urine:775] Intake/Output this shift: No intake/output data recorded.  General appearance: alert and cooperative GI: soft, non-tender; bowel sounds normal; no masses,  no organomegaly and inc c/d/I, soft [facility]  Lab Results:  Recent Labs    07/13/21 0317 07/14/21 0115  WBC 3.6* 4.9  HGB 8.5* 8.0*  HCT 25.5* 25.6*  PLT 229 225   BMET Recent Labs    07/13/21 0317  NA 132*  K 4.1  CL 102  CO2 21*  GLUCOSE 87  BUN 28*  CREATININE 1.16  CALCIUM 8.3*   PT/INR Recent Labs    07/13/21 0317 07/14/21 0115  LABPROT 13.7 15.1  INR 1.1 1.2   ABG No results for input(s): PHART, HCO3 in the last 72 hours.  Invalid input(s): PCO2, PO2  Studies/Results: No results found.  Anti-infectives: Anti-infectives (From admission, onward)    Start     Dose/Rate Route Frequency Ordered Stop   07/10/21 0630  cephALEXin (KEFLEX) capsule 500 mg        500 mg Oral 3 times daily before meals & bedtime 07/10/21 7793 07/15/21 9030       Assessment/Plan: OR 1/3 (Dr. 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 - likely intermittent oozing from anastomosis due to anticoagulation requirement. Abdominal exam is benign. -no further bleeding at this time.  Bridging to Coumadin -Hgb 8.7<--8.2<--9.8<--8.8.  no further bleeding or bloody BMs since  1/12. On iron, stools will be black. Ok for PO anticoagulation, wean heparin as able   FEN - reg diet VTE - heparin restarted 1/12 ID - none currently Foley - out 1/5 and voiding     A fib MVR on coumadin CKD   LOS: 20 days    Ralene Ok 07/14/2021

## 2021-07-15 DIAGNOSIS — I82890 Acute embolism and thrombosis of other specified veins: Secondary | ICD-10-CM | POA: Diagnosis not present

## 2021-07-15 LAB — CULTURE, BLOOD (ROUTINE X 2)
Culture: NO GROWTH
Culture: NO GROWTH
Special Requests: ADEQUATE

## 2021-07-15 LAB — TYPE AND SCREEN
ABO/RH(D): O POS
Antibody Screen: NEGATIVE
Unit division: 0

## 2021-07-15 LAB — CBC
HCT: 27.9 % — ABNORMAL LOW (ref 39.0–52.0)
Hemoglobin: 8.7 g/dL — ABNORMAL LOW (ref 13.0–17.0)
MCH: 29.9 pg (ref 26.0–34.0)
MCHC: 31.2 g/dL (ref 30.0–36.0)
MCV: 95.9 fL (ref 80.0–100.0)
Platelets: 206 10*3/uL (ref 150–400)
RBC: 2.91 MIL/uL — ABNORMAL LOW (ref 4.22–5.81)
RDW: 19 % — ABNORMAL HIGH (ref 11.5–15.5)
WBC: 3.6 10*3/uL — ABNORMAL LOW (ref 4.0–10.5)
nRBC: 0 % (ref 0.0–0.2)

## 2021-07-15 LAB — BPAM RBC
Blood Product Expiration Date: 202302122359
ISSUE DATE / TIME: 202301171546
Unit Type and Rh: 5100

## 2021-07-15 LAB — PROTIME-INR
INR: 1.6 — ABNORMAL HIGH (ref 0.8–1.2)
Prothrombin Time: 19.5 seconds — ABNORMAL HIGH (ref 11.4–15.2)

## 2021-07-15 LAB — HEPARIN LEVEL (UNFRACTIONATED): Heparin Unfractionated: 0.46 IU/mL (ref 0.30–0.70)

## 2021-07-15 MED ORDER — WARFARIN SODIUM 5 MG PO TABS
5.0000 mg | ORAL_TABLET | Freq: Once | ORAL | Status: AC
  Administered 2021-07-15: 5 mg via ORAL
  Filled 2021-07-15: qty 1

## 2021-07-15 NOTE — Assessment & Plan Note (Signed)
Right arm cellulitis due to superficial vein thrombosis in setting of peripheral venous access-completed course of Keflex.  Continue warm compresses as needed.

## 2021-07-15 NOTE — Progress Notes (Signed)
ANTICOAGULATION CONSULT NOTE - Follow Up Consult  Pharmacy Consult for Heparin bridge to Warfarin Indication:  mechanical mitral valve and atrial fibrillation  Allergies  Allergen Reactions   Flexeril [Cyclobenzaprine] Diarrhea    Patient Measurements: Height: 6' (182.9 cm) Weight: 68 kg (149 lb 14.6 oz) IBW/kg (Calculated) : 77.6 Heparin Dosing Weight: 68 kg  Vital Signs: Temp: 97.5 F (36.4 C) (01/18 1210) Temp Source: Oral (01/18 1210) BP: 122/55 (01/18 1210) Pulse Rate: 54 (01/18 1210)  Labs: Recent Labs    07/13/21 0317 07/14/21 0115 07/15/21 0108  HGB 8.5* 8.0* 8.7*  HCT 25.5* 25.6* 27.9*  PLT 229 225 206  LABPROT 13.7 15.1 19.5*  INR 1.1 1.2 1.6*  HEPARINUNFRC 0.48 0.52 0.46  CREATININE 1.16  --   --      Estimated Creatinine Clearance: 38.3 mL/min (by C-G formula based on SCr of 1.16 mg/dL).  Assessment: 88 yo M admitted with GIB on 12/28. PTA warfarin (LD 12/28 @ 0730) for mechanical mitral valve and atrial fibrillation. INR 3.9 on admission > IV vitamin k 5mg  12/29 > INR 1.2 on recheck. Pharmacy consulted to start IV heparin while warfarin is on hold.   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   Pharmacy consulted to resume Warfarin on 07/12/21.   Heparin level is therapeutic on heparin 1300 units/hr.  Goal HL =  0.3-0.5 due to acute lower GIB this admit, s/p colonoscopy 06/27/21. S/p partial colectomy on 06/30/21.   INR increased from 1.2 to 1.6 today. Goal INR 2.5-3.5 for mechanical mitral valve and Afib.  Warfarin resumed 1/15. No further bleeding or bloody BMs since 1/12.  h/o chronic anemia,  CBC stable.  PTA warfarin dose:  pt reported last time INR consistently therapeutic was on 5mg  daily.   Goal of Therapy:  INR goal  2.5-3.5 Heparin level 0.3-0.5 units/ml Monitor platelets by anticoagulation protocol: Yes   Plan:  Continue IV heparin at 1250 units/hr Warfarin 5 mg x 1 Daily heparin level and  CBC  Thank you for allowing pharmacy to participate in this patient's care.  Nicole Cella, RPh Clinical Pharmacist [phone]  07/15/2021,12:39 PM Please check AMION for all [place] phone numbers After 10:00 PM, call [place] [phone]

## 2021-07-15 NOTE — Progress Notes (Signed)
PROGRESS NOTE        PATIENT DETAILS Name: Richard Spears Age: 88 y.o. Sex: male Date of Birth: May 17, 1926 Admit Date: 06/24/2021 Admitting Physician Vianne Bulls, MD [facility]:[id number], Jenny Reichmann, MD  Brief Narrative: Patient is a 88 y.o. male with history of mechanical mitral valve replacement-on chronic Coumadin therapy-who presented with lower GI bleeding-she was found to have a large colon mass-underwent colectomy-Hospital course complicated by bleeding from the anastomotic site and acute blood loss anemia.  See below for further details.  Subjective: No further bleeding-stools are brown.  Objective: Vitals: Blood pressure (!) 176/76, pulse 76, temperature (!) 97.1 F (36.2 C), temperature source Axillary, resp. rate 18, height 6' (1.829 m), weight 68 kg, SpO2 97 %.   Exam: Gen Exam:Alert awake-not in any distress HEENT:atraumatic, normocephalic Chest: B/L clear to auscultation anteriorly CVS:S1S2 regular Abdomen:soft non tender, non distended Extremities:no edema Neurology: Non focal Skin: no rash  Pertinent Labs/Radiology: CBC Latest Ref Rng & Units 07/15/2021 07/14/2021 07/13/2021  WBC 4.0 - 10.5 K/uL 3.6(L) 4.9 3.6(L)  Hemoglobin 13.0 - 17.0 g/dL 8.7(L) 8.0(L) 8.5(L)  Hematocrit 39.0 - 52.0 % 27.9(L) 25.6(L) 25.5(L)  Platelets 150 - 400 K/uL 206 225 229    Lab Results  Component Value Date   NA 132 (L) 07/13/2021   K 4.1 07/13/2021   CL 102 07/13/2021   CO2 21 (L) 07/13/2021     Assessment/Plan: Lower GI bleeding with acute blood loss anemia: Initially felt to be due to colon mass-but redeveloped bleeding postoperatively which was thought to be due to oozing from anastomotic site in the setting of anticoagulation use.No further GI bleeding-hemoglobin stable.  Continue to watch closely.  Colon mass-s/p exploratory laparotomy-colectomy-pathology positive for adenocarcinoma: Incision site appears benign.  Evaluated by oncology-CT chest/abdomen  did not show any distant metastases-recommendations for continued surveillance in the outpatient setting.    History of mechanical mitral valve replacement: Anticoagulation briefly held in the setting of active GI bleeding-since her GI bleeding has now resolved-he is back on overlapping heparin/Coumadin.  INR remains subtherapeutic.  Pharmacy managing Coumadin.  PAF: Continue telemetry monitoring-on low-dose Lopressor-anticoagulation as above.  CHA2DS2-VASc of 3.  CKD stage IIIb: Creatinine close to baseline-watch closely.  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  Right arm cellulitis due to superficial vein thrombosis: Likely due to peripheral IV access-has completed a course of Keflex  BMI Estimated body mass index is 20.33 kg/m as calculated from the following:   Height as of this encounter: 6' (1.829 m).   Weight as of this encounter: 68 kg.    DVT Prophylaxis: Heparin/Coumadin. Procedures: None Consults: GI, cardiology, general surgery, oncology Code Status:DNR Family Communication: None at bedside   Disposition Plan: Status is: Inpatient  Remains inpatient appropriate because: Resolving lower GI bleeding-awaiting therapeutic INR-on Coumadin/heparin infusion.    Diet: Diet Order             Diet regular Room service appropriate? Yes; Fluid consistency: Thin  Diet effective now                     Antimicrobial agents: Anti-infectives (From admission, onward)    Start     Dose/Rate Route Frequency Ordered Stop   07/10/21 0630  cephALEXin (KEFLEX) capsule 500 mg        500 mg Oral 3 times daily before meals &  bedtime 07/10/21 0998 07/14/21 2301        MEDICATIONS: Scheduled Meds:  acetaminophen  1,000 mg Oral Q6H   Or   acetaminophen  650 mg Rectal Q6H   vitamin C  250 mg Oral BID WC   docusate sodium  100 mg Oral BID   feeding supplement  1 Container Oral TID BM   ferrous sulfate  325 mg Oral BID WC   metoprolol  tartrate  25 mg Oral BID   pantoprazole  40 mg Intravenous Q12H   polyethylene glycol  34 g Oral Daily   sodium chloride flush  3 mL Intravenous Q12H   Warfarin - Pharmacist Dosing Inpatient   Does not apply q1600   Continuous Infusions:  heparin 1,250 Units/hr (07/15/21 0600)   PRN Meds:.fentaNYL (SUBLIMAZE) injection, LORazepam, ondansetron (ZOFRAN) IV, oxyCODONE   I have personally reviewed following labs and imaging studies  LABORATORY DATA: CBC: Recent Labs  Lab 07/09/21 0123 07/09/21 1304 07/10/21 0506 07/10/21 1345 07/11/21 1427 07/12/21 0059 07/13/21 0317 07/14/21 0115 07/15/21 0108  WBC 4.4   < > 3.8*   < > 3.5* 4.2 3.6* 4.9 3.6*  NEUTROABS 3.3  --  2.3  --   --   --   --   --   --   HGB 8.7*   < > 8.8*   < > 8.8* 8.7* 8.5* 8.0* 8.7*  HCT 25.3*   < > 26.3*   < > 27.1* 26.0* 25.5* 25.6* 27.9*  MCV 90.7   < > 93.3   < > 93.8 94.9 95.5 96.2 95.9  PLT 196   < > 173   < > 206 229 229 225 206   < > = values in this interval not displayed.    Basic Metabolic Panel: Recent Labs  Lab 07/09/21 0123 07/10/21 0506 07/11/21 0150 07/13/21 0317  NA 131* 130* 131* 132*  K 4.0 4.2 4.2 4.1  CL 101 101 103 102  CO2 23 23 24  21*  GLUCOSE 104* 86 94 87  BUN 30* 28* 29* 28*  CREATININE 1.20 1.28* 1.29* 1.16  CALCIUM 7.8* 8.1* 7.8* 8.3*  MG 1.9 1.9  --   --     GFR: Estimated Creatinine Clearance: 38.3 mL/min (by C-G formula based on SCr of 1.16 mg/dL).  Liver Function Tests: Recent Labs  Lab 07/09/21 0123 07/10/21 0506  AST 26 35  ALT 18 23  ALKPHOS 44 45  BILITOT 0.5 0.6  PROT 5.0* 5.1*  ALBUMIN 2.0* 2.1*   No results for input(s): LIPASE, AMYLASE in the last 168 hours. No results for input(s): AMMONIA in the last 168 hours.  Coagulation Profile: Recent Labs  Lab 07/09/21 0123 07/13/21 0317 07/14/21 0115 07/15/21 0108  INR 1.1 1.1 1.2 1.6*    Cardiac Enzymes: No results for input(s): CKTOTAL, CKMB, CKMBINDEX, TROPONINI in the last 168  hours.  BNP (last 3 results) No results for input(s): PROBNP in the last 8760 hours.  Lipid Profile: No results for input(s): CHOL, HDL, LDLCALC, TRIG, CHOLHDL, LDLDIRECT in the last 72 hours.  Thyroid Function Tests: No results for input(s): TSH, T4TOTAL, FREET4, T3FREE, THYROIDAB in the last 72 hours.  Anemia Panel: No results for input(s): VITAMINB12, FOLATE, FERRITIN, TIBC, IRON, RETICCTPCT in the last 72 hours.  Urine analysis:    Component Value Date/Time   COLORURINE YELLOW 07/06/2021 [place] 07/06/2021 1456   LABSPEC 1.020 07/06/2021 1456   PHURINE 6.0 07/06/2021 1456   GLUCOSEU NEGATIVE  07/06/2021 [place] 07/06/2021 [place] 07/06/2021 1456   KETONESUR NEGATIVE 07/06/2021 1456   PROTEINUR NEGATIVE 07/06/2021 1456   NITRITE NEGATIVE 07/06/2021 1456   LEUKOCYTESUR NEGATIVE 07/06/2021 1456    Sepsis Labs: Lactic Acid, Venous No results found for: LATICACIDVEN  MICROBIOLOGY: Recent Results (from the past 240 hour(s))  Culture, blood (routine x 2)     Status: None   Collection Time: 07/10/21  6:55 AM   Specimen: BLOOD  Result Value Ref Range Status   Specimen Description BLOOD BLOOD RIGHT HAND  Final   Special Requests   Final    BOTTLES DRAWN AEROBIC AND ANAEROBIC Blood Culture results may not be optimal due to an excessive volume of blood received in culture bottles   Culture   Final    NO GROWTH 5 DAYS Performed at [facility], [place] [address], [place], [place] [address]    Report Status 07/15/2021 FINAL  Final  Culture, blood (routine x 2)     Status: None   Collection Time: 07/10/21  7:00 AM   Specimen: BLOOD  Result Value Ref Range Status   Specimen Description BLOOD BLOOD LEFT HAND  Final   Special Requests   Final    BOTTLES DRAWN AEROBIC AND ANAEROBIC Blood Culture adequate volume   Culture   Final    NO GROWTH 5 DAYS Performed at [facility], [place] [address], [place],   [address]    Report Status 07/15/2021 FINAL  Final    RADIOLOGY STUDIES/RESULTS: No results found.   LOS: 21 days   Oren Binet, MD  [facility]    To contact the attending provider between 7A-7P or the covering provider during after hours 7P-7A, please log into the web site [email or web address] and access using universal [place] password for that web site. If you do not have the password, please call the hospital operator.  07/15/2021, 10:06 AM

## 2021-07-15 NOTE — Progress Notes (Signed)
Occupational Therapy Treatment Patient Details Name: Richard Spears MRN: [id number] DOB: July 09, 1925 Today's Date: 07/15/2021   History of present illness 88 y.o. male presentign to ED 12/28 with melana. Patient admitted with GI bleed and anemia. CT (+) lower GI bleed vs mass. CT also found large L inguinal hernia containing nonobstructed loop of large bowel. Colonoscopy 12/31 confirmed near obstructing mass in transverse colon. Biopsies obtained; path pending. S/p partial transverse colectomy 1/3. PMH includes: A-fib, BPH, OA, HTN, BLE venous ulcers, renal insufficiency, mechanical mitral valve replacement, Hx of fall 08/2020 s/p L femur ORIF and R/L hip arthroplasties.   OT comments  Patient making steady progress toward goals. OT treatment with focus on self-care re-education including ADL transfers, dynamic standing balance and short-distance mobility. Patient transferred to [facility] with Min guard and RW with light Min A for sit to stand and increase time to rise. Patient continent of BM requiring light Min A for hygiene/clothing management (documented in flowsheet). Session concluded with patient seated in recliner. Patient continues to be limited by deficits listed below and would benefit from continued acute OT services in prep for safe d/c to next level of care. Continued recommendation for SNF rehab. Goals updated.    Recommendations for follow up therapy are one component of a multi-disciplinary discharge planning process, led by the attending physician.  Recommendations may be updated based on patient status, additional functional criteria and insurance authorization.    Follow Up Recommendations  Skilled nursing-short term rehab (<3 hours/day)    Assistance Recommended at Discharge Frequent or constant Supervision/Assistance  Patient can return home with the following  A little help with walking and/or transfers;A lot of help with bathing/dressing/bathroom;Assistance with  cooking/housework;Direct supervision/assist for medications management;Direct supervision/assist for financial management;Assist for transportation;Help with stairs or ramp for entrance   Equipment Recommendations  None recommended by OT    Recommendations for Other Services      Precautions / Restrictions Precautions Precautions: Fall Precaution Comments: Hx of falls; monitor HR; abdominal incision w/ precautions; BLE unna boots Restrictions Weight Bearing Restrictions: No       Mobility Bed Mobility Overal bed mobility: Needs Assistance Bed Mobility: Sit to Supine     Supine to sit: Min guard     General bed mobility comments: Min gaurd +rail. Requires increased time/effort.    Transfers Overall transfer level: Needs assistance Equipment used: Rolling walker (2 wheels) Transfers: Sit to/from Stand Sit to Stand: Min guard, Min assist           General transfer comment: Min A for EOB positioned in lowest setting and Min guard from [facility] x3. Increased time to rise. Maintains trunk and bilateal knees in slight flexion in standing. Good recall for hand placement.     Balance Overall balance assessment: Needs assistance Sitting-balance support: No upper extremity supported, Feet supported Sitting balance-Leahy Scale: Fair     Standing balance support: Bilateral upper extremity supported, Single extremity supported, During functional activity Standing balance-Leahy Scale: Fair Standing balance comment: Reliant on at least unilateral UE support to maintain static standing balance this date.                           ADL either performed or assessed with clinical judgement   ADL Overall ADL's : Needs assistance/impaired                         Toilet Transfer: Minimal assistance;Rolling walker (2  wheels) Toilet Transfer Details (indicate cue type and reason): To BSC with RW. Requires increased time/effort. Toileting- Clothing Manipulation and  Hygiene: Minimal assistance;Sit to/from stand Toileting - Clothing Manipulation Details (indicate cue type and reason): Min A for 3/3 parts of toileting task with RW and Min A.            Extremity/Trunk Assessment              Vision       Perception     Praxis      Cognition Arousal/Alertness: Awake/alert Behavior During Therapy: WFL for tasks assessed/performed Overall Cognitive Status: Within Functional Limits for tasks assessed                                          Exercises      Shoulder Instructions       General Comments VSS on RA.    Pertinent Vitals/ Pain       Pain Assessment Pain Assessment: No/denies pain Pain Intervention(s): Monitored during session  Home Living                                          Prior Functioning/Environment              Frequency  Min 2X/week        Progress Toward Goals  OT Goals(current goals can now be found in the care plan section)  Progress towards OT goals: Progressing toward goals  Acute Rehab OT Goals Patient Stated Goal: To go to SNF rehab. OT Goal Formulation: With patient Time For Goal Achievement: 07/15/21 Potential to Achieve Goals: Good ADL Goals Pt Will Perform Grooming: with modified independence;standing Pt Will Perform Upper Body Dressing: with modified independence;sitting Pt Will Perform Lower Body Dressing: with modified independence;sit to/from stand Pt Will Transfer to Toilet: with modified independence;ambulating Pt Will Perform Toileting - Clothing Manipulation and hygiene: with modified independence;sit to/from stand Pt/caregiver will Perform Home Exercise Program: Increased ROM;Increased strength;Both right and left upper extremity;Independently Additional ADL Goal #1: Patient will recall 3 strategies to reduce risk of falls in prep for safe d/c home.  Plan Discharge plan remains appropriate;Frequency remains appropriate     Co-evaluation                 AM-PAC OT "6 Clicks" Daily Activity     Outcome Measure   Help from another person eating meals?: A Little Help from another person taking care of personal grooming?: A Little Help from another person toileting, which includes using toliet, bedpan, or urinal?: A Lot Help from another person bathing (including washing, rinsing, drying)?: A Lot Help from another person to put on and taking off regular upper body clothing?: A Little Help from another person to put on and taking off regular lower body clothing?: A Lot 6 Click Score: 15    End of Session Equipment Utilized During Treatment: Rolling walker (2 wheels);Gait belt  OT Visit Diagnosis: Other abnormalities of gait and mobility (R26.89);Muscle weakness (generalized) (M62.81);History of falling (Z91.81)   Activity Tolerance Patient tolerated treatment well   Patient Left with call bell/phone within reach;in chair;with chair alarm set   Nurse Communication Mobility status        Time: 2683-4196 OT Time Calculation (min): 42 min  Charges: OT General Charges $OT Visit: 1 Visit OT Treatments $Self Care/Home Management : 38-52 mins  Merlin Ege H. OTR/L Supplemental OT, Department of rehab services [phone]  Isami Mehra R H. 07/15/2021, 10:51 AM

## 2021-07-15 NOTE — Assessment & Plan Note (Signed)
Due to above-has required numerous units of PRBC transfusion.  Hemoglobin stable-follow periodically.

## 2021-07-16 LAB — PROTIME-INR
INR: 2.2 — ABNORMAL HIGH (ref 0.8–1.2)
Prothrombin Time: 24.7 seconds — ABNORMAL HIGH (ref 11.4–15.2)

## 2021-07-16 LAB — HEPARIN LEVEL (UNFRACTIONATED): Heparin Unfractionated: 0.32 IU/mL (ref 0.30–0.70)

## 2021-07-16 LAB — CBC
HCT: 27.9 % — ABNORMAL LOW (ref 39.0–52.0)
Hemoglobin: 8.8 g/dL — ABNORMAL LOW (ref 13.0–17.0)
MCH: 30.2 pg (ref 26.0–34.0)
MCHC: 31.5 g/dL (ref 30.0–36.0)
MCV: 95.9 fL (ref 80.0–100.0)
Platelets: 215 10*3/uL (ref 150–400)
RBC: 2.91 MIL/uL — ABNORMAL LOW (ref 4.22–5.81)
RDW: 18.9 % — ABNORMAL HIGH (ref 11.5–15.5)
WBC: 3.4 10*3/uL — ABNORMAL LOW (ref 4.0–10.5)
nRBC: 0 % (ref 0.0–0.2)

## 2021-07-16 MED ORDER — WARFARIN SODIUM 5 MG PO TABS
5.0000 mg | ORAL_TABLET | Freq: Every day | ORAL | Status: AC
  Administered 2021-07-16: 5 mg via ORAL
  Filled 2021-07-16: qty 1

## 2021-07-16 NOTE — Progress Notes (Signed)
Physical Therapy Treatment Patient Details Name: Richard Spears MRN: [id number] DOB: March 26, 1926 Today's Date: 07/16/2021   History of Present Illness 88 y.o. male presentign to ED 12/28 with melana. Patient admitted with GI bleed and anemia. CT (+) lower GI bleed vs mass. CT also found large L inguinal hernia containing nonobstructed loop of large bowel. Colonoscopy 12/31 confirmed near obstructing mass in transverse colon. Biopsies obtained; path pending. S/p partial transverse colectomy 1/3. PMH includes: A-fib, BPH, OA, HTN, BLE venous ulcers, renal insufficiency, mechanical mitral valve replacement, Hx of fall 08/2020 s/p L femur ORIF and R/L hip arthroplasties.    PT Comments    Patient continues to improve and walked a significantly longer distance today. Required cues for upright posture and proximity to RW on a few occasions (especially as he fatigued). He does not feel strong enough to return to his ILF apartment and plan remains to go to SNF level at [facility].     Recommendations for follow up therapy are one component of a multi-disciplinary discharge planning process, led by the attending physician.  Recommendations may be updated based on patient status, additional functional criteria and insurance authorization.  Follow Up Recommendations  Skilled nursing-short term rehab (<3 hours/day)     Assistance Recommended at Discharge Intermittent Supervision/Assistance  Patient can return home with the following A little help with walking and/or transfers;A lot of help with bathing/dressing/bathroom;Assist for transportation   Equipment Recommendations  None recommended by PT    Recommendations for Other Services       Precautions / Restrictions Precautions Precautions: Fall Precaution Comments: Hx of falls; monitor HR; abdominal incision w/ precautions; BLE unna boots     Mobility  Bed Mobility Overal bed mobility: Needs Assistance Bed Mobility: Rolling, Sidelying  to Sit Rolling: Modified independent (Device/Increase time) Sidelying to sit: Modified independent (Device/Increase time)       General bed mobility comments: with rail, HOB elevated    Transfers Overall transfer level: Needs assistance Equipment used: Rolling walker (2 wheels) Transfers: Sit to/from Stand Sit to Stand: Min guard           General transfer comment: vc for hand placement x 4 of 10 reps and then demonstrated correct placment    Ambulation/Gait Ambulation/Gait assistance: Min guard Gait Distance (Feet): 400 Feet Assistive device: Rolling walker (2 wheels) Gait Pattern/deviations: Step-through pattern, Decreased stride length Gait velocity: decreased     General Gait Details: improved cadence   Stairs             Wheelchair Mobility    Modified Rankin (Stroke Patients Only)       Balance Overall balance assessment: Needs assistance Sitting-balance support: No upper extremity supported, Feet supported Sitting balance-Leahy Scale: Fair     Standing balance support: Bilateral upper extremity supported, Single extremity supported, During functional activity Standing balance-Leahy Scale: Fair Standing balance comment: can stand statically without UE support                            Cognition Arousal/Alertness: Awake/alert Behavior During Therapy: WFL for tasks assessed/performed Overall Cognitive Status: Within Functional Limits for tasks assessed                                          Exercises Other Exercises Other Exercises: sit to stand x 10 reps  General Comments        Pertinent Vitals/Pain Pain Assessment Pain Assessment: No/denies pain Faces Pain Scale: No hurt Breathing: normal Negative Vocalization: none Facial Expression: smiling or inexpressive Body Language: relaxed Consolability: no need to console PAINAD Score: 0    Home Living                          Prior  Function            PT Goals (current goals can now be found in the care plan section) Acute Rehab PT Goals Patient Stated Goal: [facility] at [facility] [place] Time For Goal Achievement: 07/15/21 Potential to Achieve Goals: Good Progress towards PT goals: Progressing toward goals    Frequency    Min 2X/week      PT Plan Current plan remains appropriate    Co-evaluation              AM-PAC PT "6 Clicks" Mobility   Outcome Measure  Help needed turning from your back to your side while in a flat bed without using bedrails?: None Help needed moving from lying on your back to sitting on the side of a flat bed without using bedrails?: None Help needed moving to and from a bed to a chair (including a wheelchair)?: A Little Help needed standing up from a chair using your arms (e.g., wheelchair or bedside chair)?: A Little Help needed to walk in hospital room?: A Little Help needed climbing 3-5 steps with a railing? : A Lot 6 Click Score: 19    End of Session Equipment Utilized During Treatment: Gait belt Activity Tolerance: Patient tolerated treatment well Patient left: in chair;with chair alarm set;with call bell/phone within reach Nurse Communication: Mobility status PT Visit Diagnosis: Other abnormalities of gait and mobility (R26.89);Muscle weakness (generalized) (M62.81);Difficulty in walking, not elsewhere classified (R26.2)     Time: 0973-5329 PT Time Calculation (min) (ACUTE ONLY): 33 min  Charges:  $Gait Training: 23-37 mins                      [name], PT Acute Rehabilitation Services  Pager [phone] Office [phone]    [name] 07/16/2021, 12:09 PM

## 2021-07-16 NOTE — Progress Notes (Signed)
PROGRESS NOTE        PATIENT DETAILS Name: Richard Spears Age: 88 y.o. Sex: male Date of Birth: 03-Sep-1925 Admit Date: 06/24/2021 Admitting Physician Richard Bulls, MD QQI:[id number], Richard Reichmann, MD  Brief Narrative: Patient is a 88 y.o. male with history of mechanical mitral valve replacement-on chronic Coumadin therapy-who presented with lower GI bleeding-she was found to have a large colon mass-underwent colectomy-Hospital course complicated by bleeding from the anastomotic site and acute blood loss anemia.  See below for further details.  Subjective: Richard Spears stools-no further rectal bleeding.  Lying comfortably in bed.  Objective: Vitals: Blood pressure (!) 112/44, pulse 74, temperature 97.8 F (36.6 C), temperature source Oral, resp. rate 16, height 6' (1.829 m), weight 68 kg, SpO2 96 %.   Exam: Gen Exam:Alert awake-not in any distress HEENT:atraumatic, normocephalic Chest: B/L clear to auscultation anteriorly CVS:S1S2 regular Abdomen:soft non tender, non distended Extremities:no edema Neurology: Non focal Skin: no rash  Pertinent Labs/Radiology: CBC Latest Ref Rng & Units 07/16/2021 07/15/2021 07/14/2021  WBC 4.0 - 10.5 K/uL 3.4(L) 3.6(L) 4.9  Hemoglobin 13.0 - 17.0 g/dL 8.8(L) 8.7(L) 8.0(L)  Hematocrit 39.0 - 52.0 % 27.9(L) 27.9(L) 25.6(L)  Platelets 150 - 400 K/uL 215 206 225    Lab Results  Component Value Date   NA 132 (L) 07/13/2021   K 4.1 07/13/2021   CL 102 07/13/2021   CO2 21 (L) 07/13/2021    Assessment/Plan: * GI bleeding- (present on admission)  Lower GI bleeding Initially felt to be due to colon mass-but redeveloped bleeding postoperatively which was thought to be due to oozing from anastomotic site in the setting of anticoagulation use.No further GI bleeding-hemoglobin stable.  Continue to watch closely.  Acute blood loss anemia- (present on admission) Due to above-has required numerous units of PRBC transfusion.  Hemoglobin  stable-follow periodically.  S/P MVR (mitral valve replacement) Anticoagulation briefly held in the setting of active GI bleeding-since her GI bleeding has now resolved-he is back on overlapping heparin/Coumadin.  INR remains subtherapeutic-but uptrending.Marland Kitchen  Pharmacy managing Coumadin.    Longstanding persistent atrial fibrillation: CHA2DS2-VASc Score 3- (present on admission) Continue telemetry monitoring-on low-dose Lopressor-anticoagulation as above.  CHA2DS2-VASc of 3.  Essential hypertension- (present on admission) BP controlled-continue Lopressor.  Hyponatremia- (present on admission) Due to SIADH-continue to follow electrolytes periodically.  CKD (chronic kidney disease) stage 3, GFR 30-59 ml/min (HCC)- (present on admission) Creatinine close to baseline-watch closely  Superficial vein thrombosis Right arm cellulitis due to superficial vein thrombosis in setting of peripheral venous access-completed course of Keflex.  Continue warm compresses as needed.   BMI Estimated body mass index is 20.33 kg/m as calculated from the following:   Height as of this encounter: 6' (1.829 m).   Weight as of this encounter: 68 kg.   DVT Prophylaxis: Heparin/Coumadin. Procedures: 12/31 colonoscopy, 1/3 colon resection  Consults: GI, cardiology, general surgery, oncology Code Status:DNR Family Communication: Son Richard Spears [phone] updated on 1/19   Disposition Plan: Status is: Inpatient  Remains inpatient appropriate because: Overlapping heparin/Coumadin-awaiting therapeutic INR.    Diet: Diet Order             Diet regular Room service appropriate? Yes; Fluid consistency: Thin  Diet effective now                     Antimicrobial agents: Anti-infectives (From admission, onward)  Start     Dose/Rate Route Frequency Ordered Stop   07/10/21 0630  cephALEXin (KEFLEX) capsule 500 mg        500 mg Oral 3 times daily before meals & bedtime 07/10/21 3016 07/14/21  2301        MEDICATIONS: Scheduled Meds:  acetaminophen  1,000 mg Oral Q6H   Or   acetaminophen  650 mg Rectal Q6H   vitamin C  250 mg Oral BID WC   docusate sodium  100 mg Oral BID   feeding supplement  1 Container Oral TID BM   ferrous sulfate  325 mg Oral BID WC   metoprolol tartrate  25 mg Oral BID   pantoprazole  40 mg Intravenous Q12H   polyethylene glycol  34 g Oral Daily   sodium chloride flush  3 mL Intravenous Q12H   warfarin  5 mg Oral q1600   Warfarin - Pharmacist Dosing Inpatient   Does not apply q1600   Continuous Infusions:  heparin 1,250 Units/hr (07/15/21 1722)   PRN Meds:.fentaNYL (SUBLIMAZE) injection, LORazepam, ondansetron (ZOFRAN) IV, oxyCODONE   I have personally reviewed following labs and imaging studies  LABORATORY DATA: CBC: Recent Labs  Lab 07/10/21 0506 07/10/21 1345 07/12/21 0059 07/13/21 0317 07/14/21 0115 07/15/21 0108 07/16/21 0139  WBC 3.8*   < > 4.2 3.6* 4.9 3.6* 3.4*  NEUTROABS 2.3  --   --   --   --   --   --   HGB 8.8*   < > 8.7* 8.5* 8.0* 8.7* 8.8*  HCT 26.3*   < > 26.0* 25.5* 25.6* 27.9* 27.9*  MCV 93.3   < > 94.9 95.5 96.2 95.9 95.9  PLT 173   < > 229 229 225 206 215   < > = values in this interval not displayed.    Basic Metabolic Panel: Recent Labs  Lab 07/10/21 0506 07/11/21 0150 07/13/21 0317  NA 130* 131* 132*  K 4.2 4.2 4.1  CL 101 103 102  CO2 23 24 21*  GLUCOSE 86 94 87  BUN 28* 29* 28*  CREATININE 1.28* 1.29* 1.16  CALCIUM 8.1* 7.8* 8.3*  MG 1.9  --   --     GFR: Estimated Creatinine Clearance: 38.3 mL/min (by C-G formula based on SCr of 1.16 mg/dL).  Liver Function Tests: Recent Labs  Lab 07/10/21 0506  AST 35  ALT 23  ALKPHOS 45  BILITOT 0.6  PROT 5.1*  ALBUMIN 2.1*   No results for input(s): LIPASE, AMYLASE in the last 168 hours. No results for input(s): AMMONIA in the last 168 hours.  Coagulation Profile: Recent Labs  Lab 07/13/21 0317 07/14/21 0115 07/15/21 0108  07/16/21 0139  INR 1.1 1.2 1.6* 2.2*    Cardiac Enzymes: No results for input(s): CKTOTAL, CKMB, CKMBINDEX, TROPONINI in the last 168 hours.  BNP (last 3 results) No results for input(s): PROBNP in the last 8760 hours.  Lipid Profile: No results for input(s): CHOL, HDL, LDLCALC, TRIG, CHOLHDL, LDLDIRECT in the last 72 hours.  Thyroid Function Tests: No results for input(s): TSH, T4TOTAL, FREET4, T3FREE, THYROIDAB in the last 72 hours.  Anemia Panel: No results for input(s): VITAMINB12, FOLATE, FERRITIN, TIBC, IRON, RETICCTPCT in the last 72 hours.  Urine analysis:    Component Value Date/Time   COLORURINE YELLOW 07/06/2021 [place] 07/06/2021 1456   LABSPEC 1.020 07/06/2021 1456   PHURINE 6.0 07/06/2021 1456   GLUCOSEU NEGATIVE 07/06/2021 1456   HGBUR NEGATIVE 07/06/2021 1456  BILIRUBINUR NEGATIVE 07/06/2021 [place] 07/06/2021 1456   PROTEINUR NEGATIVE 07/06/2021 1456   NITRITE NEGATIVE 07/06/2021 1456   LEUKOCYTESUR NEGATIVE 07/06/2021 1456    Sepsis Labs: Lactic Acid, Venous No results found for: LATICACIDVEN  MICROBIOLOGY: Recent Results (from the past 240 hour(s))  Culture, blood (routine x 2)     Status: None   Collection Time: 07/10/21  6:55 AM   Specimen: BLOOD  Result Value Ref Range Status   Specimen Description BLOOD BLOOD RIGHT HAND  Final   Special Requests   Final    BOTTLES DRAWN AEROBIC AND ANAEROBIC Blood Culture results may not be optimal due to an excessive volume of blood received in culture bottles   Culture   Final    NO GROWTH 5 DAYS Performed at [facility], [place] [address] [place], [place] [address]    Report Status 07/15/2021 FINAL  Final  Culture, blood (routine x 2)     Status: None   Collection Time: 07/10/21  7:00 AM   Specimen: BLOOD  Result Value Ref Range Status   Specimen Description BLOOD BLOOD LEFT HAND  Final   Special Requests   Final    BOTTLES DRAWN AEROBIC AND ANAEROBIC Blood  Culture adequate volume   Culture   Final    NO GROWTH 5 DAYS Performed at [facility], [place] [address] [place], [place] [address]    Report Status 07/15/2021 FINAL  Final    RADIOLOGY STUDIES/RESULTS: No results found.   LOS: 22 days   Oren Binet, MD  [facility]    To contact the attending provider between 7A-7P or the covering provider during after hours 7P-7A, please log into the web site [email or web address] and access using universal [place] password for that web site. If you do not have the password, please call the hospital operator.  07/16/2021, 12:42 PM

## 2021-07-16 NOTE — Progress Notes (Signed)
ANTICOAGULATION CONSULT NOTE - Follow Up Consult  Pharmacy Consult for Heparin bridge to Warfarin Indication:  mechanical mitral valve and atrial fibrillation  Allergies  Allergen Reactions   Flexeril [Cyclobenzaprine] Diarrhea    Patient Measurements: Height: 6' (182.9 cm) Weight: 68 kg (149 lb 14.6 oz) IBW/kg (Calculated) : 77.6 Heparin Dosing Weight: 68 kg  Vital Signs: Temp: 97.7 F (36.5 C) (01/19 0755) Temp Source: Oral (01/19 0755) BP: 175/97 (01/19 0755) Pulse Rate: 79 (01/19 0500)  Labs: Recent Labs    07/14/21 0115 07/15/21 0108 07/16/21 0139  HGB 8.0* 8.7* 8.8*  HCT 25.6* 27.9* 27.9*  PLT 225 206 215  LABPROT 15.1 19.5* 24.7*  INR 1.2 1.6* 2.2*  HEPARINUNFRC 0.52 0.46 0.32     Estimated Creatinine Clearance: 38.3 mL/min (by C-G formula based on SCr of 1.16 mg/dL).  Assessment: 88 yo M admitted with GIB on 12/28. PTA warfarin (LD 12/28 @ 0730) for mechanical mitral valve and atrial fibrillation. INR 3.9 on admission > IV vitamin k 5mg  12/29 > INR 1.2 on recheck. Pharmacy consulted to start IV heparin while warfarin is on hold.   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   Pharmacy consulted to resume Warfarin on 07/12/21.   Heparin level remains therapeutic on heparin 1250 units/hr.  Goal HL =  0.3-0.5 due to acute lower GIB this admit, s/p colonoscopy 06/27/21. S/p partial colectomy on 06/30/21.   INR  = 2.2 today. Goal INR 2.5-3.5 for mechanical mitral valve and Afib.  Warfarin resumed 1/15. No further bleeding or bloody BMs since 1/12.  h/o chronic anemia,  CBC stable.  PTA warfarin dose:  pt reported last time INR consistently therapeutic was on 5mg  daily.   Goal of Therapy:  INR goal  2.5-3.5 Heparin level 0.3-0.5 units/ml Monitor platelets by anticoagulation protocol: Yes   Plan:  Continue IV heparin at 1250 units/hr Warfarin 5 mg daily Daily heparin level, INR and CBC  Thank you for allowing pharmacy to  participate in this patient's care.  Nicole Cella, RPh Clinical Pharmacist [phone]  07/16/2021,12:12 PM Please check AMION for all [place] phone numbers After 10:00 PM, call [place] [phone]

## 2021-07-17 DIAGNOSIS — Z7401 Bed confinement status: Secondary | ICD-10-CM | POA: Diagnosis not present

## 2021-07-17 DIAGNOSIS — Z9049 Acquired absence of other specified parts of digestive tract: Secondary | ICD-10-CM | POA: Diagnosis not present

## 2021-07-17 DIAGNOSIS — I059 Rheumatic mitral valve disease, unspecified: Secondary | ICD-10-CM | POA: Diagnosis not present

## 2021-07-17 DIAGNOSIS — E871 Hypo-osmolality and hyponatremia: Secondary | ICD-10-CM | POA: Diagnosis not present

## 2021-07-17 DIAGNOSIS — K922 Gastrointestinal hemorrhage, unspecified: Secondary | ICD-10-CM | POA: Diagnosis not present

## 2021-07-17 DIAGNOSIS — I480 Paroxysmal atrial fibrillation: Secondary | ICD-10-CM | POA: Diagnosis not present

## 2021-07-17 DIAGNOSIS — Z5181 Encounter for therapeutic drug level monitoring: Secondary | ICD-10-CM | POA: Diagnosis not present

## 2021-07-17 DIAGNOSIS — R278 Other lack of coordination: Secondary | ICD-10-CM | POA: Diagnosis not present

## 2021-07-17 DIAGNOSIS — Z952 Presence of prosthetic heart valve: Secondary | ICD-10-CM | POA: Diagnosis not present

## 2021-07-17 DIAGNOSIS — N400 Enlarged prostate without lower urinary tract symptoms: Secondary | ICD-10-CM | POA: Diagnosis not present

## 2021-07-17 DIAGNOSIS — G4700 Insomnia, unspecified: Secondary | ICD-10-CM | POA: Diagnosis not present

## 2021-07-17 DIAGNOSIS — I82611 Acute embolism and thrombosis of superficial veins of right upper extremity: Secondary | ICD-10-CM | POA: Diagnosis not present

## 2021-07-17 DIAGNOSIS — I82890 Acute embolism and thrombosis of other specified veins: Secondary | ICD-10-CM | POA: Diagnosis not present

## 2021-07-17 DIAGNOSIS — D62 Acute posthemorrhagic anemia: Secondary | ICD-10-CM | POA: Diagnosis not present

## 2021-07-17 DIAGNOSIS — Z483 Aftercare following surgery for neoplasm: Secondary | ICD-10-CM | POA: Diagnosis not present

## 2021-07-17 DIAGNOSIS — R58 Hemorrhage, not elsewhere classified: Secondary | ICD-10-CM | POA: Diagnosis not present

## 2021-07-17 DIAGNOSIS — I129 Hypertensive chronic kidney disease with stage 1 through stage 4 chronic kidney disease, or unspecified chronic kidney disease: Secondary | ICD-10-CM | POA: Diagnosis not present

## 2021-07-17 DIAGNOSIS — I4811 Longstanding persistent atrial fibrillation: Secondary | ICD-10-CM | POA: Diagnosis not present

## 2021-07-17 DIAGNOSIS — M6281 Muscle weakness (generalized): Secondary | ICD-10-CM | POA: Diagnosis not present

## 2021-07-17 DIAGNOSIS — R2681 Unsteadiness on feet: Secondary | ICD-10-CM | POA: Diagnosis not present

## 2021-07-17 DIAGNOSIS — I10 Essential (primary) hypertension: Secondary | ICD-10-CM | POA: Diagnosis not present

## 2021-07-17 DIAGNOSIS — R1313 Dysphagia, pharyngeal phase: Secondary | ICD-10-CM | POA: Diagnosis not present

## 2021-07-17 DIAGNOSIS — C184 Malignant neoplasm of transverse colon: Secondary | ICD-10-CM | POA: Diagnosis not present

## 2021-07-17 DIAGNOSIS — C189 Malignant neoplasm of colon, unspecified: Secondary | ICD-10-CM | POA: Diagnosis not present

## 2021-07-17 DIAGNOSIS — R531 Weakness: Secondary | ICD-10-CM | POA: Diagnosis not present

## 2021-07-17 DIAGNOSIS — N183 Chronic kidney disease, stage 3 unspecified: Secondary | ICD-10-CM | POA: Diagnosis not present

## 2021-07-17 LAB — HEPARIN LEVEL (UNFRACTIONATED): Heparin Unfractionated: 0.34 IU/mL (ref 0.30–0.70)

## 2021-07-17 LAB — PROTIME-INR
INR: 2.6 — ABNORMAL HIGH (ref 0.8–1.2)
Prothrombin Time: 27.7 seconds — ABNORMAL HIGH (ref 11.4–15.2)

## 2021-07-17 MED ORDER — OXYCODONE HCL 5 MG PO TABS: 2.5000 mg | ORAL_TABLET | Freq: Four times a day (QID) | ORAL | 0 refills | Status: DC | PRN

## 2021-07-17 MED ORDER — WARFARIN SODIUM 5 MG PO TABS: 5.0000 mg | ORAL_TABLET | Freq: Every day | ORAL | Status: DC

## 2021-07-17 MED ORDER — WARFARIN SODIUM 5 MG PO TABS: 5.0000 mg | ORAL_TABLET | Freq: Every day | ORAL | 0 refills | Status: DC

## 2021-07-17 MED ORDER — FERROUS SULFATE 325 (65 FE) MG PO TABS: 325.0000 mg | ORAL_TABLET | Freq: Two times a day (BID) | ORAL | 3 refills | Status: DC

## 2021-07-17 MED ORDER — LORAZEPAM 1 MG PO TABS: 0.5000 mg | ORAL_TABLET | Freq: Every day | ORAL | 0 refills | Status: DC | PRN

## 2021-07-17 NOTE — Progress Notes (Signed)
RN + EMS services at bedside. Patient appears calm and free of pain. Patient to be transported to [facility]. Multiple attempts made to SNF to call report; unable to give report. Telemonitoring connections removed + IV removed. Patient tolerated well. AVS handed to EMS services. Agricultural consultant notified

## 2021-07-17 NOTE — Discharge Summary (Addendum)
PATIENT DETAILS Name: RODRIGUEZ AGUINALDO Age: 88 y.o. Sex: male Date of Birth: September 26, 1925 MRN: [id number]. Admitting Physician: Vianne Bulls, MD CWC:[id number], Jenny Reichmann, MD  Admit Date: 06/24/2021 Discharge date: 07/17/2021  Recommendations for Outpatient Follow-up:  Follow up with PCP in 1-2 weeks Please obtain CMP/CBC in one week Please ensure follow-up with oncology, cardiology Please recheck INR on 1/21-and adjust dosing of Coumadin accordingly.  Goal INR is 2.5-3.5 given that he has a history of mechanical mitral valve placement.  Admitted From:  SNF  Disposition: SNF   Home Health: No  Equipment/Devices: None  Discharge Condition: Stable  CODE STATUS: DNR  Diet recommendation:  Diet Order             Diet - low sodium heart healthy           Diet regular Room service appropriate? Yes; Fluid consistency: Thin  Diet effective now                    Brief Summary: Patient is a 88 y.o. male with history of mechanical mitral valve replacement-on chronic Coumadin therapy-who presented with lower GI bleeding-she was found to have a large colon mass (surgical path T2n0 adenocarcinoma)-underwent colectomy-Hospital course complicated by bleeding from the anastomotic site and acute blood loss anemia.  See below for further details.  Brief Hospital Course: * GI bleeding- (present on admission)  Lower GI bleeding initially felt to be due to colon mass-but redeveloped bleeding postoperatively which was thought to be due to oozing from anastomotic site in the setting of anticoagulation use.anticoagulation was held-he was managed with supportive care and as needed PRBC transfusion.  Thankfully GI bleeding is completely resolved.  Acute blood loss anemia- (present on admission) Due to above-has required numerous units of PRBC transfusion.  Hemoglobin stable-follow periodically.  S/P MVR (mitral valve replacement) Anticoagulation briefly held in the setting of active  GI bleeding-since GI bleeding has now resolved-he was placed back on overlapping heparin/Coumadin.  INR has slowly increased-and is now therapeutic.  Stop heparin-continue with usual dosing of Coumadin.  Please recheck INR in the next few days while at [facility].     Longstanding persistent atrial fibrillation: CHA2DS2-VASc Score 3- (present on admission) Rate controlled-on beta-blocker-on Coumadin with therapeutic INR.  Continue to monitor INR levels at SNF and adjust dosing of Coumadin accordingly.  CHA2DS2-VASc of 3.   Essential hypertension- (present on admission) BP controlled-but slowly creeping up-resume Imdur/hydralazine-continue bisoprolol on discharge.  Optimize accordingly at SNF.  Hyponatremia- (present on admission) Due to SIADH-continue to follow electrolytes periodically.  CKD (chronic kidney disease) stage 3, GFR 30-59 ml/min (HCC)- (present on admission) Creatinine close to baseline-watch closely  Superficial vein thrombosis Right arm cellulitis due to superficial vein thrombosis in setting of peripheral venous access-completed course of Keflex.  Continue warm compresses as needed.   Transverse colon mass-s/p exploratory laparotomy-colectomy-pathology positive for adenocarcinoma- (present on admission)  Incision site appears benign.  Evaluated by oncology-CT chest/abdomen did not show any distant metastases-recommendations for continued surveillance in the outpatient setting.    Obesity: Estimated body mass index is 20.33 kg/m as calculated from the following:   Height as of this encounter: 6' (1.829 m).   Weight as of this encounter: 68 kg.     Procedures 12/31 colonoscopy,  1/3 colon resection   Discharge Diagnoses:  Principal Problem:   GI bleeding Active Problems:   Acute blood loss anemia   S/P MVR (mitral valve replacement)   Longstanding persistent atrial fibrillation:  CHA2DS2-VASc Score 3   Essential hypertension   Hyponatremia   CKD (chronic kidney  disease) stage 3, GFR 30-59 ml/min (HCC)   Superficial vein thrombosis   Transverse colon mass-s/p exploratory laparotomy-colectomy-pathology positive for adenocarcinoma   Discharge Instructions:  Activity:  As tolerated with Full fall precautions use walker/cane & assistance as needed   Discharge Instructions     Call MD for:  redness, tenderness, or signs of infection (pain, swelling, redness, odor or green/yellow discharge around incision site)   Complete by: As directed    Diet - low sodium heart healthy   Complete by: As directed    Discharge instructions   Complete by: As directed    Follow with Primary MD  [name], MD in 1-2 weeks  Please get a complete blood count and chemistry panel checked by your Primary MD at your next visit, and again as instructed by your Primary MD.  Get Medicines reviewed and adjusted: Please take all your medications with you for your next visit with your Primary MD  Laboratory/radiological data: Please request your Primary MD to go over all hospital tests and procedure/radiological results at the follow up, please ask your Primary MD to get all Hospital records sent to his/her office.  In some cases, they will be blood work, cultures and biopsy results pending at the time of your discharge. Please request that your primary care M.D. follows up on these results.  Also Note the following: If you experience worsening of your admission symptoms, develop shortness of breath, life threatening emergency, suicidal or homicidal thoughts you must seek medical attention immediately by calling 911 or calling your MD immediately  if symptoms less severe.  You must read complete instructions/literature along with all the possible adverse reactions/side effects for all the Medicines you take and that have been prescribed to you. Take any new Medicines after you have completely understood and accpet all the possible adverse reactions/side effects.   Do not  drive when taking Pain medications or sleeping medications (Benzodaizepines)  Do not take more than prescribed Pain, Sleep and Anxiety Medications. It is not advisable to combine anxiety,sleep and pain medications without talking with your primary care practitioner  Special Instructions: If you have smoked or chewed Tobacco  in the last 2 yrs please stop smoking, stop any regular Alcohol  and or any Recreational drug use.  Wear Seat belts while driving.  Please note: You were cared for by a hospitalist during your hospital stay. Once you are discharged, your primary care physician will handle any further medical issues. Please note that NO REFILLS for any discharge medications will be authorized once you are discharged, as it is imperative that you return to your primary care physician (or establish a relationship with a primary care physician if you do not have one) for your post hospital discharge needs so that they can reassess your need for medications and monitor your lab values.   Increase activity slowly   Complete by: As directed    No dressing needed   Complete by: As directed       Allergies as of 07/17/2021       Reactions   Flexeril [cyclobenzaprine] Diarrhea        Medication List     STOP taking these medications    furosemide 40 MG tablet Commonly known as: Lasix   tamsulosin 0.4 MG Caps capsule Commonly known as: FLOMAX       TAKE these medications    acetaminophen  500 MG tablet Commonly known as: TYLENOL Take 1,000 mg by mouth 3 (three) times daily as needed for mild pain.   bisoprolol 5 MG tablet Commonly known as: ZEBETA Take 1 tablet (5 mg total) by mouth daily.   ferrous sulfate 325 (65 FE) MG tablet Take 1 tablet (325 mg total) by mouth 2 (two) times daily with a meal. What changed:  medication strength how much to take when to take this additional instructions   fluticasone 50 MCG/ACT nasal spray Commonly known as: FLONASE Place 1 spray  into both nostrils daily as needed for allergies or rhinitis.   hydrALAZINE 25 MG tablet Commonly known as: APRESOLINE Take 25 mg by mouth daily at lunch, may take an  extra tablet as needed for systolic BP greater than 932 mmHg daily   isosorbide mononitrate 30 MG 24 hr tablet Commonly known as: IMDUR Take 15 mg by mouth daily.   lactose free nutrition Liqd Take 237 mLs by mouth 2 (two) times daily between meals.   LORazepam 1 MG tablet Commonly known as: ATIVAN Take 0.5 tablets (0.5 mg total) by mouth daily as needed for anxiety. What changed: how much to take   MULTIVITAMIN PO Take 1 tablet by mouth daily.   oxyCODONE 5 MG immediate release tablet Commonly known as: Oxy IR/ROXICODONE Take 0.5 tablets (2.5 mg total) by mouth every 6 (six) hours as needed for severe pain.   polyethylene glycol 17 g packet Commonly known as: MIRALAX / GLYCOLAX Take 17 g by mouth daily as needed for mild constipation.   PreserVision AREDS 2 Caps Take 1 capsule by mouth daily.   warfarin 5 MG tablet Commonly known as: COUMADIN Take as directed. If you are unsure how to take this medication, talk to your nurse or doctor. Original instructions: Take 1 tablet (5 mg total) by mouth daily. What changed: See the new instructions.               Discharge Care Instructions  (From admission, onward)           Start     Ordered   07/17/21 0000  No dressing needed        07/17/21 0940            Contact information for follow-up providers     Clovis Riley, MD. Go on 07/29/2021.   Specialty: General Surgery Why: Your appointment is 07/29/21 at 1:40pm Please arrive 15 minutes early to check in. Contact information: [address] [place] [address] [phone]         [name], MD. Schedule an appointment as soon as possible for a visit in 1 week(s).   Specialty: Internal Medicine Contact information: [address]  [place] [address] [phone]         Leonie Man, MD Follow up in 1 week(s).   Specialty: Cardiology Contact information: [address] [place] [address] [phone]         [name], MD Follow up on 08/13/2021.   Specialties: Hematology, Oncology Why: appt at 1 pm Contact information: [place] [address] [phone]              Contact information for after-discharge care     Destination     [facility] .   Service: Skilled Chiropodist information: [place] [phone]  Allergies  Allergen Reactions   Flexeril [Cyclobenzaprine] Diarrhea      Consultations:  GI, cardiology, general surgery, oncology   Other Procedures/Studies: CT CHEST WO CONTRAST  Result Date: 07/02/2021 CLINICAL DATA:  Colon cancer, staging. EXAM: CT CHEST WITHOUT CONTRAST TECHNIQUE: Multidetector CT imaging of the chest was performed following the standard protocol without IV contrast. COMPARISON:  No prior chest CT. FINDINGS: Cardiovascular: Advanced atherosclerosis of the thoracic aorta. No aortic aneurysm. Descending aorta is tortuous. Multi chamber cardiomegaly. Mitral valve replacement. There are coronary artery calcifications. No pericardial effusion. Prominence of the central pulmonary artery at 3.4 cm. Mediastinum/Nodes: No enlarged mediastinal lymph nodes, paucity of body and mediastinal fat limits detailed assessment. There is no bulky hilar adenopathy on this unenhanced exam. No gross esophageal wall thickening. Upper esophagus slightly patulous. Lungs/Pleura: No pulmonary mass or suspicious nodule. Calcified granuloma in the anterior subpleural right middle lobe. Trace bilateral pleural effusions and associated atelectasis. Linear subsegmental atelectasis in the lingula. No confluent consolidation. Upper Abdomen: Free air  in the upper abdomen likely related to recent partial colectomy. High-density material in the gallbladder was not seen on prior abdominal CT and likely reflect bones are carious excretion of contrast. Musculoskeletal: Prior median sternotomy. L1 inferior endplate compression fracture. No blastic or destructive lytic lesion. Generalized paucity of body fat. IMPRESSION: 1. No evidence of metastatic disease in the thorax. 2. Trace bilateral pleural effusions and associated atelectasis. 3. Multi chamber cardiomegaly. Coronary artery calcifications. Prominence of the central pulmonary artery suggesting pulmonary arterial hypertension. Aortic Atherosclerosis (ICD10-I70.0). Electronically Signed   By: Keith Rake M.D.   On: 07/02/2021 22:09   US RENAL  Result Date: 07/06/2021 CLINICAL DATA:  Acute kidney injury EXAM: RENAL / URINARY TRACT ULTRASOUND COMPLETE COMPARISON:  CT 06/24/2021 FINDINGS: Right Kidney: Renal measurements: 10.5 x 4.2 x 4.8 cm = volume: 110.2 mL. Cortex appears slightly echogenic. No hydronephrosis. Small cysts measuring up to 11 mm at the midpole. Left Kidney: Renal measurements: 9.9 x 4.8 x 4.1 cm = volume: 94.7 mL. Cortex appears slightly echogenic. No mass or hydronephrosis. Bladder: Slightly thick-walled urinary bladder Other: None. IMPRESSION: 1. Kidneys are slightly echogenic consistent with medical renal disease. 2. Negative for hydronephrosis 3. Slightly thick-walled urinary bladder Electronically Signed   By: Donavan Foil M.D.   On: 07/06/2021 16:42   ECHOCARDIOGRAM COMPLETE  Result Date: 07/10/2021    ECHOCARDIOGRAM REPORT   Patient Name:   AVARY PITSENBARGER [facility] Date of Exam: 07/10/2021 Medical Rec #:  [id number]           Height:       72.0 in Accession #:    [id number]          Weight:       149.9 lb Date of Birth:  1925-11-27           BSA:          1.885 m Patient Age:    16 years            BP:           142/78 mmHg Patient Gender: M                   HR:           96 bpm. Exam  Location:  Inpatient Procedure: 2D Echo Indications:    Mitral valve replacement  History:        Patient has prior history of Echocardiogram examinations, most  recent 01/18/2019. Aortic Valve Disease; Risk                 Factors:Dyslipidemia. Mitral valve replacement.  Sonographer:    Arlyss Gandy Referring Phys: [place] Comments: Patient supine IMPRESSIONS  1. Left ventricular ejection fraction, by estimation, is 55 to 60%. The left ventricle has normal function. The left ventricle has no regional wall motion abnormalities. There is mild concentric left ventricular hypertrophy. Diastolic function is indeterminant due to Afib.  2. Right ventricular systolic function is mildly reduced. The right ventricular size is mildly enlarged. There is normal pulmonary artery systolic pressure. The estimated right ventricular systolic pressure is 73.7 mmHg.  3. Left atrial size was severely dilated.  4. Right atrial size was severely dilated.  5. The mitral valve has been repaired/replaced. Patient is s/p St Jude bileaflet mechanical valve replacement. Echo findings are consistent with normal structure and function of the mitral valve prosthesis. Mean gradient is 7mmHg at 99bpm.  6. The aortic valve is tricuspid. There is mild calcification of the aortic valve. There is mild thickening of the aortic valve. Aortic valve regurgitation is moderate.  7. Aortic dilatation noted. There is borderline dilatation of the aortic root, measuring 37 mm.  8. The inferior vena cava is normal in size with <50% respiratory variability, suggesting right atrial pressure of 8 mmHg. Comparison(s): Compared to prior TTE in 2020, the AR now appears moderate. Otherwise no significant change. Mean mitral valve gradient stable at ~5-9mmHg. FINDINGS  Left Ventricle: Left ventricular ejection fraction, by estimation, is 55 to 60%. The left ventricle has normal function. The left ventricle has no regional wall  motion abnormalities. The left ventricular internal cavity size was normal in size. There is  mild concentric left ventricular hypertrophy. Abnormal (paradoxical) septal motion consistent with post-operative status. Diastolic function is indeterminant due to Afib. Right Ventricle: The right ventricular size is mildly enlarged. No increase in right ventricular wall thickness. Right ventricular systolic function is mildly reduced. There is normal pulmonary artery systolic pressure. The tricuspid regurgitant velocity  is 2.76 m/s, and with an assumed right atrial pressure of 3 mmHg, the estimated right ventricular systolic pressure is 10.6 mmHg. Left Atrium: Left atrial size was severely dilated. Right Atrium: Right atrial size was severely dilated. Pericardium: There is no evidence of pericardial effusion. Mitral Valve: Mean gradient 48mmHg at HR 99bpm. The mitral valve has been repaired/replaced. There is a St. Jude present in the mitral position. Echo findings are consistent with normal structure and function of the mitral valve prosthesis. MV peak gradient, 14.4 mmHg. The mean mitral valve gradient is 6.0 mmHg. Tricuspid Valve: The tricuspid valve is normal in structure. Tricuspid valve regurgitation is mild. Aortic Valve: The aortic valve is tricuspid. There is mild calcification of the aortic valve. There is mild thickening of the aortic valve. Aortic valve regurgitation is moderate. Aortic regurgitation PHT measures 364 msec. Aortic valve mean gradient measures 5.5 mmHg. Aortic valve peak gradient measures 10.1 mmHg. Aortic valve area, by VTI measures 1.94 cm. Pulmonic Valve: The pulmonic valve was normal in structure. Pulmonic valve regurgitation is trivial. Aorta: Aortic dilatation noted. There is borderline dilatation of the aortic root, measuring 37 mm. Venous: The inferior vena cava is normal in size with less than 50% respiratory variability, suggesting right atrial pressure of 8 mmHg. IAS/Shunts: The  atrial septum is grossly normal.  LEFT VENTRICLE PLAX 2D LVIDd:         4.20 cm  Diastology LVIDs:         3.10 cm   LV e' medial:    9.30 cm/s LV PW:         1.30 cm   LV E/e' medial:  14.7 LV IVS:        1.30 cm   LV e' lateral:   12.80 cm/s LVOT diam:     2.10 cm   LV E/e' lateral: 10.7 LV SV:         53 LV SV Index:   28 LVOT Area:     3.46 cm  RIGHT VENTRICLE             IVC RV Basal diam:  4.10 cm     IVC diam: 1.80 cm RV S prime:     10.70 cm/s TAPSE (M-mode): 1.6 cm LEFT ATRIUM              Index        RIGHT ATRIUM           Index LA diam:        5.00 cm  2.65 cm/m   RA Area:     29.70 cm LA Vol (A2C):   102.0 ml 54.11 ml/m  RA Volume:   98.90 ml  52.47 ml/m LA Vol (A4C):   103.0 ml 54.64 ml/m LA Biplane Vol: 104.0 ml 55.18 ml/m  AORTIC VALVE AV Area (Vmax):    1.98 cm AV Area (Vmean):   1.95 cm AV Area (VTI):     1.94 cm AV Vmax:           159.00 cm/s AV Vmean:          108.350 cm/s AV VTI:            0.274 m AV Peak Grad:      10.1 mmHg AV Mean Grad:      5.5 mmHg LVOT Vmax:         90.95 cm/s LVOT Vmean:        61.150 cm/s LVOT VTI:          0.154 m LVOT/AV VTI ratio: 0.56 AI PHT:            364 msec  AORTA Ao Root diam: 3.70 cm Ao Asc diam:  3.10 cm MITRAL VALVE                TRICUSPID VALVE MV Area (PHT): 2.87 cm     TR Peak grad:   30.5 mmHg MV Area VTI:   1.60 cm     TR Vmax:        276.00 cm/s MV Peak grad:  14.4 mmHg MV Mean grad:  6.0 mmHg     SHUNTS MV Vmax:       1.90 m/s     Systemic VTI:  0.15 m MV Vmean:      112.0 cm/s   Systemic Diam: 2.10 cm MV Decel Time: 264 msec MV E velocity: 137.00 cm/s MV A velocity: 0.00 cm/s Gwyndolyn Kaufman MD Electronically signed by Gwyndolyn Kaufman MD Signature Date/Time: 07/10/2021/3:10:59 PM    Final    VAS [place] UPPER EXTREMITY VENOUS DUPLEX  Result Date: 07/10/2021 UPPER VENOUS STUDY  Patient Name:  OSHEA PERCIVAL  Date of Exam:   07/10/2021 Medical Rec #: [id number]            Accession #:    [id number] Date of Birth: Feb 28, 1926  Patient Gender: M Patient Age:   39 years Exam Location:  [facility] Procedure:      VAS [place] UPPER EXTREMITY VENOUS DUPLEX Referring Phys: Inda Merlin --------------------------------------------------------------------------------  Indications: Pain, Swelling, and Erythema Comparison Study: No prior study Performing Technologist: Maudry Mayhew MHA, RDMS, RVT, RDCS  Examination Guidelines: A complete evaluation includes B-mode imaging, spectral Doppler, color Doppler, and power Doppler as needed of all accessible portions of each vessel. Bilateral testing is considered an integral part of a complete examination. Limited examinations for reoccurring indications may be performed as noted.  Right Findings: +----------+------------+---------+-----------+----------+--------------------+  RIGHT      Compressible Phasicity Spontaneous Properties       Summary         +----------+------------+---------+-----------+----------+--------------------+  IJV            Full        Yes        Yes                                      +----------+------------+---------+-----------+----------+--------------------+  Subclavian     Full        Yes        Yes                                      +----------+------------+---------+-----------+----------+--------------------+  Axillary       Full        Yes        Yes                                      +----------+------------+---------+-----------+----------+--------------------+  Brachial       Full        Yes        Yes                                      +----------+------------+---------+-----------+----------+--------------------+  Radial         Full                                                            +----------+------------+---------+-----------+----------+--------------------+  Ulnar          Full                                                            +----------+------------+---------+-----------+----------+--------------------+  Cephalic        None                                       Acute, mid forearm  only          +----------+------------+---------+-----------+----------+--------------------+  Basilic        Full                                                            +----------+------------+---------+-----------+----------+--------------------+  Left Findings: +----------+------------+---------+-----------+----------+-------+  LEFT       Compressible Phasicity Spontaneous Properties Summary  +----------+------------+---------+-----------+----------+-------+  Subclavian                 Yes        Yes                         +----------+------------+---------+-----------+----------+-------+  Summary:  Right: No evidence of deep vein thrombosis in the upper extremity. Findings consistent with acute superficial vein thrombosis involving the right cephalic vein.  Left: No evidence of thrombosis in the subclavian.  *See table(s) above for measurements and observations.  Diagnosing physician: Jamelle Haring Electronically signed by Jamelle Haring on 07/10/2021 at 2:49:56 PM.    Final    CT ANGIO GI BLEED  Result Date: 06/24/2021 CLINICAL DATA:  Evaluate for lower GI tract bleed. EXAM: CTA ABDOMEN AND PELVIS WITHOUT AND WITH CONTRAST TECHNIQUE: Multidetector CT imaging of the abdomen and pelvis was performed using the standard protocol during bolus administration of intravenous contrast. Multiplanar reconstructed images and MIPs were obtained and reviewed to evaluate the vascular anatomy. CONTRAST:  86mL OMNIPAQUE IOHEXOL 350 MG/ML SOLN COMPARISON:  12/15/20 FINDINGS: VASCULAR Aorta: Normal caliber aorta without aneurysm, dissection, vasculitis or significant stenosis. Aortic atherosclerotic calcifications. None Celiac: Calcified plaque with approximately 50% stenosis at the origin of the celiac artery noted. SMA: Calcified plaque with approximately 60% stenosis at the origin of the  SMA. Renals: Calcified plaque at the origin of both renal arteries results in greater than 50% stenosis bilaterally. IMA: Appears patent with greater than 50% stenosis at the origin. Inflow: Patent without evidence of aneurysm, dissection, vasculitis or significant stenosis. Proximal Outflow: Bilateral common femoral and visualized portions of the superficial and profunda femoral arteries are patent. There is non flow limiting dissection involving the right external iliac artery, image 601 through image 663/11. Veins: No obvious venous abnormality within the limitations of this arterial phase study. Review of the MIP images confirms the above findings. NON-VASCULAR Lower chest: Cardiac enlargement.  Lung bases are clear. Hepatobiliary: No acute abnormality. Low-density structure within segment 8 of the liver is technically too small to characterize measuring 7 mm, image 29/17. Gallbladder appears normal. No bile duct dilatation. Pancreas: Unremarkable. No pancreatic ductal dilatation or surrounding inflammatory changes. Spleen: Normal in size without focal abnormality. Adrenals/Urinary Tract: Normal adrenal glands. No kidney stones or obstructive uropathy identified. Several subcentimeter low-density foci are identified within the right renal cortex which are technically too small to characterize measuring less than 1 cm. Urinary bladder is largely obscured by beam hardening artifact from patient's bilateral hip arthroplasty devices. Stomach/Bowel: Within the mid transverse colon there is a masslike intraluminal filling defect with avid arterial phase enhancement measuring 4.1 by 3.7 by 3.5 cm, image 42/13 and image 90/5. No additional abnormal areas of intraluminal contrast enhancement identified. No bowel wall thickening, inflammation or distension. Lymphatic: No enlarged lymph nodes. Reproductive: Prostate gland is largely obscured by streak artifact from bilateral hip  arthroplasty devices. Other: Large left  inguinal hernia contains a nonobstructed loop of large bowel. Musculoskeletal: Status post bilateral hip arthroplasty. Scoliosis and degenerative disc disease is identified. IMPRESSION: 1. Avid arterial phase enhancing masslike filling defect within the mid transverse colon is identified. Although conceivably this could represent a large blood clot the diagnosis of exclusion is a primary colonic neoplasm. Further evaluation with colonoscopy is recommended. 2. Extensive atherosclerotic disease noted with stenosis noted at the origin of the celiac artery, SMA, IMA and both renal arteries. 3. 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 4. Large left inguinal hernia contains a nonobstructed loop of large bowel. 5. Aortic Atherosclerosis (ICD10-I70.0). These results were called by telephone at the time of interpretation on 06/24/2021 at 9:47 pm to provider [facility] , who verbally acknowledged these results. Electronically Signed   By: Kerby Moors M.D.   On: 06/24/2021 21:48     TODAY-DAY OF DISCHARGE:  Subjective:   [name] today has no headache,no chest abdominal pain,no new weakness tingling or numbness, feels much better wants to go home today.  Objective:   Blood pressure 139/68, pulse 64, temperature 97.6 F (36.4 C), temperature source Oral, resp. rate 17, height 6' (1.829 m), weight 68 kg, SpO2 97 %.  Intake/Output Summary (Last 24 hours) at 07/17/2021 1408 Last data filed at 07/17/2021 0500 Gross per 24 hour  Intake 303 ml  Output 675 ml  Net -372 ml   Filed Weights   06/24/21 2254 06/27/21 0753  Weight: 68 kg 68 kg    Exam: Awake Alert, Oriented *3, No new F.N deficits, Normal affect [place].AT,PERRAL Supple Neck,No JVD, No cervical lymphadenopathy appriciated.  Symmetrical Chest wall movement, Good air movement bilaterally, CTAB RRR,No Gallops,Rubs or new Murmurs, No Parasternal Heave +ve B.Sounds, Abd Soft, Non tender, No organomegaly  appriciated, No rebound -guarding or rigidity. No Cyanosis, Clubbing or edema, No new Rash or bruise   PERTINENT RADIOLOGIC STUDIES: No results found.   PERTINENT LAB RESULTS: CBC: Recent Labs    07/15/21 0108 07/16/21 0139  WBC 3.6* 3.4*  HGB 8.7* 8.8*  HCT 27.9* 27.9*  PLT 206 215   CMET CMP     Component Value Date/Time   NA 132 (L) 07/13/2021 0317   NA 134 (A) 10/23/2020 1035   NA 130 10/20/2017 0000   K 4.1 07/13/2021 0317   K 4.2 10/20/2017 0000   CL 102 07/13/2021 0317   CO2 21 (L) 07/13/2021 0317   GLUCOSE 87 07/13/2021 0317   BUN 28 (H) 07/13/2021 0317   BUN 33 (A) 10/23/2020 1035   CREATININE 1.16 07/13/2021 0317   CREATININE 1.38 10/20/2017 0000   CALCIUM 8.3 (L) 07/13/2021 0317   CALCIUM 8.6 10/20/2017 0000   PROT 5.1 (L) 07/10/2021 0506   ALBUMIN 2.1 (L) 07/10/2021 0506   AST 35 07/10/2021 0506   ALT 23 07/10/2021 0506   ALKPHOS 45 07/10/2021 0506   BILITOT 0.6 07/10/2021 0506   GFRNONAA 59 (L) 07/13/2021 0317   GFRAA 61 10/23/2020 1035    GFR Estimated Creatinine Clearance: 38.3 mL/min (by C-G formula based on SCr of 1.16 mg/dL). No results for input(s): LIPASE, AMYLASE in the last 72 hours. No results for input(s): CKTOTAL, CKMB, CKMBINDEX, TROPONINI in the last 72 hours. Invalid input(s): POCBNP No results for input(s): DDIMER in the last 72 hours. No results for input(s): HGBA1C in the last 72 hours. No results for input(s): CHOL, HDL, LDLCALC, TRIG, CHOLHDL, LDLDIRECT in the last 72  hours. No results for input(s): TSH, T4TOTAL, T3FREE, THYROIDAB in the last 72 hours.  Invalid input(s): FREET3 No results for input(s): VITAMINB12, FOLATE, FERRITIN, TIBC, IRON, RETICCTPCT in the last 72 hours. Coags: Recent Labs    07/16/21 0139 07/17/21 0147  INR 2.2* 2.6*   Microbiology: Recent Results (from the past 240 hour(s))  Culture, blood (routine x 2)     Status: None   Collection Time: 07/10/21  6:55 AM   Specimen: BLOOD  Result Value  Ref Range Status   Specimen Description BLOOD BLOOD RIGHT HAND  Final   Special Requests   Final    BOTTLES DRAWN AEROBIC AND ANAEROBIC Blood Culture results may not be optimal due to an excessive volume of blood received in culture bottles   Culture   Final    NO GROWTH 5 DAYS Performed at [facility], [place] [address], [place], [place] [address]    Report Status 07/15/2021 FINAL  Final  Culture, blood (routine x 2)     Status: None   Collection Time: 07/10/21  7:00 AM   Specimen: BLOOD  Result Value Ref Range Status   Specimen Description BLOOD BLOOD LEFT HAND  Final   Special Requests   Final    BOTTLES DRAWN AEROBIC AND ANAEROBIC Blood Culture adequate volume   Culture   Final    NO GROWTH 5 DAYS Performed at [facility], [place] [address] [place], [place] [address]    Report Status 07/15/2021 FINAL  Final    FURTHER DISCHARGE INSTRUCTIONS:  Get Medicines reviewed and adjusted: Please take all your medications with you for your next visit with your Primary MD  Laboratory/radiological data: Please request your Primary MD to go over all hospital tests and procedure/radiological results at the follow up, please ask your Primary MD to get all Hospital records sent to his/her office.  In some cases, they will be blood work, cultures and biopsy results pending at the time of your discharge. Please request that your primary care M.D. goes through all the records of your hospital data and follows up on these results.  Also Note the following: If you experience worsening of your admission symptoms, develop shortness of breath, life threatening emergency, suicidal or homicidal thoughts you must seek medical attention immediately by calling 911 or calling your MD immediately  if symptoms less severe.  You must read complete instructions/literature along with all the possible adverse reactions/side effects for all the Medicines you take and that have been prescribed to you. Take  any new Medicines after you have completely understood and accpet all the possible adverse reactions/side effects.   Do not drive when taking Pain medications or sleeping medications (Benzodaizepines)  Do not take more than prescribed Pain, Sleep and Anxiety Medications. It is not advisable to combine anxiety,sleep and pain medications without talking with your primary care practitioner  Special Instructions: If you have smoked or chewed Tobacco  in the last 2 yrs please stop smoking, stop any regular Alcohol  and or any Recreational drug use.  Wear Seat belts while driving.  Please note: You were cared for by a hospitalist during your hospital stay. Once you are discharged, your primary care physician will handle any further medical issues. Please note that NO REFILLS for any discharge medications will be authorized once you are discharged, as it is imperative that you return to your primary care physician (or establish a relationship with a primary care physician if you do not have  one) for your post hospital discharge needs so that they can reassess your need for medications and monitor your lab values.  Total Time spent coordinating discharge including counseling, education and face to face time equals 35 minutes.  Signed: Ronette Hank 07/17/2021 2:08 PM

## 2021-07-17 NOTE — Progress Notes (Signed)
ANTICOAGULATION CONSULT NOTE - Follow Up Consult  Pharmacy Consult for Heparin bridge to Warfarin Indication:  mechanical mitral valve and atrial fibrillation  Allergies  Allergen Reactions   Flexeril [Cyclobenzaprine] Diarrhea    Patient Measurements: Height: 6' (182.9 cm) Weight: 68 kg (149 lb 14.6 oz) IBW/kg (Calculated) : 77.6 Heparin Dosing Weight: 68 kg  Vital Signs: Temp: 97.6 F (36.4 C) (01/20 1159) Temp Source: Oral (01/20 1159) BP: 139/68 (01/20 1159) Pulse Rate: 64 (01/20 0518)  Labs: Recent Labs    07/15/21 0108 07/16/21 0139 07/17/21 0147  HGB 8.7* 8.8*  --   HCT 27.9* 27.9*  --   PLT 206 215  --   LABPROT 19.5* 24.7* 27.7*  INR 1.6* 2.2* 2.6*  HEPARINUNFRC 0.46 0.32 0.34     Estimated Creatinine Clearance: 38.3 mL/min (by C-G formula based on SCr of 1.16 mg/dL).  Assessment: 88 yo M admitted with acute GIB on 12/28. PTA warfarin (LD 12/28 @ 0730) for mechanical mitral valve and atrial fibrillation. INR 3.9 on admission > IV vitamin k 5mg  12/29 > INR 1.2 on recheck. Pharmacy consulted to start IV heparin while warfarin is on hold.  s/p colonoscopy 06/27/21. 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   Pharmacy consulted to resume Warfarin on 07/12/21.   Heparin level remains therapeutic on heparin 1250 units/hr and INR = 2.6 today, which is therapeutic (goal INR 2.5-3.5).  No further bleeding or bloody BM  PTA warfarin dose:  pt reported last time INR consistently therapeutic was on 5mg  daily.   Goal of Therapy:  INR goal  2.5-3.5 Heparin level 0.3-0.5 units/ml Monitor platelets by anticoagulation protocol: Yes   Plan:  Discontinue IV heparin as INR is therapeutic. Discharge on Warfarin 5 mg daily with close INR follow up at SNF,  check INR either 1/21 or 1/22 then again next week.    Thank you for allowing pharmacy to participate in this patient's care.  Nicole Cella, RPh Clinical Pharmacist [phone]  07/17/2021,1:27  PM Please check AMION for all [place] phone numbers After 10:00 PM, call [place] [phone]

## 2021-07-17 NOTE — NC FL2 (Signed)
[place] MEDICAID FL2 LEVEL OF CARE SCREENING TOOL     IDENTIFICATION  Patient Name: Richard Spears Birthdate: 1926-03-28 Sex: male Admission Date (Current Location): 06/24/2021  [facility] and [place] Number:  Herbalist and Address:  [facility] [place] [place] [address] [place], [place] [address]      Provider Number: [id number]  Attending Physician Name and Address:  Jonetta Osgood, MD  Relative Name and Phone Number:       Current Level of Care: Hospital Recommended Level of Care: [place] Prior Approval Number:    Date Approved/Denied:   PASRR Number: [id number]  Discharge Plan: SNF    Current Diagnoses: Patient Active Problem List   Diagnosis Date Noted   Superficial vein thrombosis 07/15/2021   Transverse colon mass-s/p exploratory laparotomy-colectomy-pathology positive for adenocarcinoma 07/03/2021   GI bleeding 06/24/2021   Acute blood loss anemia 02/03/2021   CKD (chronic kidney disease) stage 3, GFR 30-59 ml/min ([place]) 12/23/2020   Pressure ulcer of right buttock, stage 2 ([place]) 10/07/2020   UTI (urinary tract infection) 09/26/2020   Weight loss 09/23/2020   Insomnia 09/23/2020   Dysuria 09/23/2020   Hyponatremia 09/16/2020   BPH (benign prostatic hyperplasia) 09/03/2020   Generalized osteoarthritis of multiple sites 09/03/2020   Slow transit constipation 08/30/2020   Closed displaced fracture of distal epiphysis of left femur ([place]) 08/25/2020   Macrocytic anemia 08/25/2020   Fall 08/25/2020   Alcohol use 08/25/2020   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   Hip fracture ([place]) 10/09/2017   Essential hypertension 05/12/2017   Exertional dyspnea 01/18/2016   Venous insufficiency 02/14/2015   Cancer of skin of right lower leg 10/31/2014   AI (aortic insufficiency) 01/23/2014   Longstanding persistent atrial fibrillation: CHA2DS2-VASc Score 3 01/17/2014   Hyperlipidemia    S/P MVR (mitral valve  replacement) 04/08/1996    Orientation RESPIRATION BLADDER Height & Weight     Self, Time, Situation, Place  Normal Continent Weight: 149 lb 14.6 oz (68 kg) Height:  6' (182.9 cm)  BEHAVIORAL SYMPTOMS/MOOD NEUROLOGICAL BOWEL NUTRITION STATUS      Incontinent Diet (Please See DC Summary)  AMBULATORY STATUS COMMUNICATION OF NEEDS Skin   Limited Assist   Surgical wounds (closed incision on abdomen; venous stasis ulcer on leg)                       Personal Care Assistance Level of Assistance  Bathing, Feeding, Dressing Bathing Assistance: Limited assistance Feeding assistance: Independent Dressing Assistance: Limited assistance     Functional Limitations Info  Sight, Hearing, Speech Sight Info: Impaired Hearing Info: Adequate Speech Info: Adequate    SPECIAL CARE FACTORS FREQUENCY  OT (By licensed OT), PT (By licensed PT)     PT Frequency: 5x/week OT Frequency: 5x/week            Contractures Contractures Info: Not present    Additional Factors Info  Code Status, Allergies Code Status Info: DNR Allergies Info: Flexeril (Cyclobenzaprine)           Current Medications (07/17/2021):  This is the current hospital active medication list Current Facility-Administered Medications  Medication Dose Route Frequency Provider Last Rate Last Admin   acetaminophen (TYLENOL) tablet 1,000 mg  1,000 mg Oral Q6H Romana Juniper A, MD   1,000 mg at 07/17/21 0549   Or   acetaminophen (TYLENOL) suppository 650 mg  650 mg Rectal Q6H Clovis Riley, MD  ascorbic acid (VITAMIN C) tablet 250 mg  250 mg Oral BID WC Ileana Roup, MD   250 mg at 07/16/21 1804   docusate sodium (COLACE) capsule 100 mg  100 mg Oral BID Elgergawy, Silver Huguenin, MD   100 mg at 07/16/21 2129   feeding supplement (BOOST / RESOURCE BREEZE) liquid 1 Container  1 Container Oral TID BM Clovis Riley, MD   1 Container at 07/16/21 2000   fentaNYL (SUBLIMAZE) injection 12.5-25 mcg  12.5-25 mcg  Intravenous Q4H PRN Romana Juniper A, MD       ferrous sulfate tablet 325 mg  325 mg Oral BID WC Ileana Roup, MD   325 mg at 07/16/21 1805   LORazepam (ATIVAN) tablet 0.5 mg  0.5 mg Oral Daily PRN Romana Juniper A, MD   0.5 mg at 07/15/21 2248   metoprolol tartrate (LOPRESSOR) tablet 25 mg  25 mg Oral BID Thurnell Lose, MD   25 mg at 07/16/21 2124   ondansetron (ZOFRAN) injection 4 mg  4 mg Intravenous Q6H PRN Romana Juniper A, MD       oxyCODONE (Oxy IR/ROXICODONE) immediate release tablet 2.5-5 mg  2.5-5 mg Oral Q6H PRN Romana Juniper A, MD   5 mg at 07/16/21 2128   pantoprazole (PROTONIX) injection 40 mg  40 mg Intravenous Q12H Thurnell Lose, MD   40 mg at 07/16/21 2123   polyethylene glycol (MIRALAX / GLYCOLAX) packet 34 g  34 g Oral Daily Elgergawy, Silver Huguenin, MD   34 g at 07/16/21 0907   sodium chloride flush (NS) 0.9 % injection 3 mL  3 mL Intravenous Q12H Romana Juniper A, MD   3 mL at 07/16/21 2134   warfarin (COUMADIN) tablet 5 mg  5 mg Oral q1600 Wendee Beavers, [facility]       Warfarin - Pharmacist Dosing Inpatient   Does not apply q1600 Levonne Spiller, [facility]   Given at 07/16/21 1555     Discharge Medications: Please see discharge summary for a list of discharge medications.  Relevant Imaging Results:  Relevant Lab Results:   Additional Information SSN# [id number] Moderna COVID-19 Vaccine 05/12/2020, 07/30/2019, 07/02/2019  Moderna Covid-19 Booster Vaccine 12/03/2020  Benard Halsted, LCSW

## 2021-07-17 NOTE — Progress Notes (Signed)
Occupational Therapy Treatment Patient Details Name: Richard Spears MRN: [id number] DOB: August 19, 1925 Today's Date: 07/17/2021   History of present illness 88 y.o. male presentign to ED 12/28 with melana. Patient admitted with GI bleed and anemia. CT (+) lower GI bleed vs mass. CT also found large L inguinal hernia containing nonobstructed loop of large bowel. Colonoscopy 12/31 confirmed near obstructing mass in transverse colon. Biopsies obtained; path pending. S/p partial transverse colectomy 1/3. PMH includes: A-fib, BPH, OA, HTN, BLE venous ulcers, renal insufficiency, mechanical mitral valve replacement, Hx of fall 08/2020 s/p L femur ORIF and R/L hip arthroplasties.   OT comments  Pt. Seen for skilled OT treatment session.  Able to complete bed mobility and standing grooming and toileting tasks.  Motivated and eager to progress with continued exercises back at friends home.  Note d/c likely later today.     Recommendations for follow up therapy are one component of a multi-disciplinary discharge planning process, led by the attending physician.  Recommendations may be updated based on patient status, additional functional criteria and insurance authorization.    Follow Up Recommendations  Skilled nursing-short term rehab (<3 hours/day)    Assistance Recommended at Discharge Frequent or constant Supervision/Assistance  Patient can return home with the following  A little help with walking and/or transfers;A lot of help with bathing/dressing/bathroom;Assistance with cooking/housework;Direct supervision/assist for medications management;Direct supervision/assist for financial management;Assist for transportation;Help with stairs or ramp for entrance   Equipment Recommendations  None recommended by OT    Recommendations for Other Services      Precautions / Restrictions Precautions Precautions: Fall Precaution Comments: Hx of falls; monitor HR; abdominal incision w/ precautions; BLE  [place]na boots       Mobility Bed Mobility Overal bed mobility: Needs Assistance Bed Mobility: Rolling, Sidelying to Sit Rolling: Modified independent (Device/Increase time) Sidelying to sit: Modified independent (Device/Increase time)       General bed mobility comments: with rail, HOB elevated    Transfers                         Balance                                           ADL either performed or assessed with clinical judgement   ADL Overall ADL's : Needs assistance/impaired     Grooming: Min guard;Standing;Wash/dry hands;Wash/dry face;Oral care                     Toilet Transfer Details (indicate cue type and reason): stood for urinal use min guard a with ue support on counter while using the urinal                Extremity/Trunk Assessment              Vision       Perception     Praxis      Cognition                                                Exercises      Shoulder Instructions       General Comments      Pertinent Vitals/ Pain  Pain Assessment Pain Assessment: No/denies pain  Home Living                                          Prior Functioning/Environment              Frequency  Min 2X/week        Progress Toward Goals  OT Goals(current goals can now be found in the care plan section)  Progress towards OT goals: Progressing toward goals     Plan Discharge plan remains appropriate;Frequency remains appropriate    Co-evaluation                 AM-PAC OT "6 Clicks" Daily Activity     Outcome Measure   Help from another person eating meals?: A Little Help from another person taking care of personal grooming?: A Little Help from another person toileting, which includes using toliet, bedpan, or urinal?: A Lot Help from another person bathing (including washing, rinsing, drying)?: A Lot Help from another person to put  on and taking off regular upper body clothing?: A Little Help from another person to put on and taking off regular lower body clothing?: A Lot 6 Click Score: 15    End of Session Equipment Utilized During Treatment: Rolling walker (2 wheels);Gait belt  OT Visit Diagnosis: Other abnormalities of gait and mobility (R26.89);Muscle weakness (generalized) (M62.81);History of falling (Z91.81)   Activity Tolerance Patient tolerated treatment well   Patient Left in chair;with call bell/phone within reach   Nurse Communication          Time: [phone] OT Time Calculation (min): 27 min  Charges: OT General Charges $OT Visit: 1 Visit OT Treatments $Self Care/Home Management : 23-37 mins  Richard Spears, COTA/L Acute Rehabilitation [phone]   Richard Spears 07/17/2021, 12:29 PM

## 2021-07-17 NOTE — TOC Transition Note (Signed)
Transition of Care [facility]) - CM/SW Discharge Note   Patient Details  Name: Richard Spears MRN: [id number] Date of Birth: Oct 20, 1925  [facility] CM/SW Contact:  Benard Halsted, LCSW Phone Number: 07/17/2021, 12:02 PM   Clinical Narrative:    Patient will DC to: [place] [facility] Anticipated DC date: 07/17/21 Family notified: Son Transport by: Corey Harold   Per MD patient ready for DC to [facility]. RN to call report prior to discharge [phone], ext [phone] or [phone] room 19 [place] ). RN, patient, patient's family, and facility notified of DC. Discharge Summary and FL2 sent to facility. DC packet on chart. Ambulance transport requested for patient.   CSW will sign off for now as social work intervention is no longer needed. Please consult [place] again if new needs arise.     Final next level of care: Skilled Nursing Facility Barriers to Discharge: Barriers Resolved   Patient Goals and CMS Choice Patient states their goals for this hospitalization and ongoing recovery are:: To return home w/his wife CMS Medicare.gov Compare Post Acute Care list provided to:: Patient Choice offered to / list presented to : Patient, Adult Children  Discharge Placement   Existing PASRR number confirmed : 07/17/21          Patient chooses bed at: [place] Patient to be transferred to facility by: [place] Name of family member notified: Son Patient and family notified of of transfer: 07/17/21  Discharge Plan and Services In-house Referral: Clinical Social Work   Post Acute Care Choice: [place]                               Social Determinants of Health (SDOH) Interventions     Readmission Risk Interventions No flowsheet data found.

## 2021-07-17 NOTE — Care Management Important Message (Signed)
Important Message  Patient Details  Name: Richard Spears MRN: [id number] Date of Birth: 1926/01/22   Medicare Important Message Given:  Yes     [place] 07/17/2021, 1:27 PM

## 2021-07-17 NOTE — Assessment & Plan Note (Addendum)
Incision site appears benign.  Evaluated by oncology-CT chest/abdomen did not show any distant metastases-recommendations for continued surveillance in the outpatient setting.

## 2021-07-20 ENCOUNTER — Non-Acute Institutional Stay (SKILLED_NURSING_FACILITY): Payer: Medicare Other | Admitting: Nurse Practitioner

## 2021-07-20 ENCOUNTER — Encounter: Payer: Self-pay | Admitting: Nurse Practitioner

## 2021-07-20 DIAGNOSIS — N400 Enlarged prostate without lower urinary tract symptoms: Secondary | ICD-10-CM | POA: Diagnosis not present

## 2021-07-20 DIAGNOSIS — D62 Acute posthemorrhagic anemia: Secondary | ICD-10-CM | POA: Diagnosis not present

## 2021-07-20 DIAGNOSIS — N183 Chronic kidney disease, stage 3 unspecified: Secondary | ICD-10-CM

## 2021-07-20 DIAGNOSIS — Z952 Presence of prosthetic heart valve: Secondary | ICD-10-CM | POA: Diagnosis not present

## 2021-07-20 DIAGNOSIS — E871 Hypo-osmolality and hyponatremia: Secondary | ICD-10-CM | POA: Diagnosis not present

## 2021-07-20 DIAGNOSIS — I82890 Acute embolism and thrombosis of other specified veins: Secondary | ICD-10-CM

## 2021-07-20 DIAGNOSIS — I4811 Longstanding persistent atrial fibrillation: Secondary | ICD-10-CM | POA: Diagnosis not present

## 2021-07-20 DIAGNOSIS — G4700 Insomnia, unspecified: Secondary | ICD-10-CM | POA: Diagnosis not present

## 2021-07-20 DIAGNOSIS — I10 Essential (primary) hypertension: Secondary | ICD-10-CM

## 2021-07-20 DIAGNOSIS — C184 Malignant neoplasm of transverse colon: Secondary | ICD-10-CM | POA: Diagnosis not present

## 2021-07-20 NOTE — Assessment & Plan Note (Signed)
Anxiety, takes prn Lorazepam.

## 2021-07-20 NOTE — Assessment & Plan Note (Signed)
resolved right arm cellulitis

## 2021-07-20 NOTE — Assessment & Plan Note (Signed)
SIADH, Na 132 07/13/21, update CMP/eGFR.

## 2021-07-20 NOTE — Assessment & Plan Note (Signed)
heart rate is in control, takes Bisoprolol

## 2021-07-20 NOTE — Assessment & Plan Note (Signed)
F/u cardiology, 07/18/21 IRN 2.9(goal is 2.5-3.5) for mechanical mitral valve placement. Continue Coumadin 5mg  qd.

## 2021-07-20 NOTE — Assessment & Plan Note (Signed)
Blood pressure is controlled,  takes Hydralazine, Bisoprolol, Imdur

## 2021-07-20 NOTE — Assessment & Plan Note (Signed)
Bun/creat 28/1.16 07/13/21

## 2021-07-20 NOTE — Assessment & Plan Note (Signed)
blood loss, on Iron, Hgb 8.0 07/14/21, s/p PRBC transfusion,  repeat CBC/diff.

## 2021-07-20 NOTE — Progress Notes (Signed)
Location:   SNF [place] Room Number: 35 Place of Service:  SNF (31) Provider: [facility] Estoria Geary NP  [name], MD  Patient Care Team: [name], MD as PCP - General (Internal Medicine) Leonie Claretha Townshend, MD as PCP - Cardiology (Cardiology)  Extended Emergency Contact Information Primary Emergency Contact: [facility] Address: [address]  [id number]          [place] [address] [place] Phone: [phone] Relation: Spouse Secondary Emergency Contact: Jvon, Meroney Mobile Phone: [phone] Relation: Son  Code Status: DNR Goals of care: Advanced Directive information Advanced Directives 07/12/2021  Does Patient Have a Medical Advance Directive? -  Type of Advance Directive -  Does patient want to make changes to medical advance directive? -  Copy of [place] in Chart? -  Would patient like information on creating a medical advance directive? No - Patient declined  Pre-existing out of facility DNR order (yellow form or pink MOST form) -     Chief Complaint  Patient presents with   Acute Visit    Medication review following hospital stay.     HPI:  Pt is a 88 y.o. male seen today for an acute visit for medication review following hospital stay.   Hospitalized 06/24/21-07/17/21 for GI bleed, he was found to have a large colon mass(T2n0 adenocarcinoma)-underwent colectomy 06/30/21-f/u oncology. Post op was complicated with oozing from anastomotic site, s/p RRBC transfusion  Anemia, blood loss, on Iron, Hgb 8.0 07/14/21 F/u cardiology, CBC/CMP one week, 07/18/21 IRN 2.9(goal is 2.5-3.5) for mechanical mitral valve placement.  Afib, heart rate is in control, takes Bisoprolol HTN, takes Hydralazine, Bisoprolol, Imdur Hyponatremia, SIADH, Na 132 07/13/21 CKD Bun/creat 28/1.16 07/13/21 Superficial venin thrombosis, resolved right arm cellulitis Anxiety, takes prn Lorazepam.   BPH, urinary frequency, off Tamsulosin,  had CT at urology   Past Medical History:  Diagnosis Date   Anemia    leakoppenia   BPH (benign prostatic hypertrophy)    Bullous pemphigoid    [name], March 2011, right forearm squamous cell carcinoma   Chronic anticoagulation    systemic   Colon polyp    transverse, 2002   History of peptic ulcer    remote, 3/95   Hx of actinic keratosis    Hx of basal cell carcinoma    Hx of squamous cell carcinoma of skin    Hyperlipidemia    Left inguinal hernia    Moderate aortic insufficiency 2009   audible aortic insufficiency on 1/09 echo   PAF (paroxysmal atrial fibrillation) ([place]) 01/17/2014   On Warfarin.   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 Jude mechanical valve   ORIF FEMUR FRACTURE Left 08/28/2020   Procedure: OPEN REDUCTION INTERNAL FIXATION (ORIF) DISTAL FEMUR  FRACTURE;  Surgeon: Rod Can, MD;  Location: [place];  Service: Orthopedics;  Laterality: Left;   PARTIAL COLECTOMY N/A 06/30/2021   Procedure: PARTIAL COLECTOMY;  Surgeon: Clovis Riley, MD;  Location: [place];  Service: General;  Laterality: N/A;   TOTAL HIP ARTHROPLASTY Right 10/12/2017   Procedure: RIGHT TOTAL HIP ARTHROPLASTY ANTERIOR APPROACH;  Surgeon: Gaynelle Arabian, MD;  Location: [facility];  Service: Orthopedics;  Laterality: Right;    TRANSTHORACIC ECHOCARDIOGRAM  12/2018   Unable to assess diastolic function because of A. fib. Normal RV function, but moderately elevated RVSP.  Severe biatrial enlargement. S/P St Jude bileaflet mechanical MVR that appears to be functioning normally. Mitral valve regurgitation cannot assess due to mechanical valve shadowing. MV Mean grad: 7.0 mmHg MV Area (PHT): 3.38 cm (stable for valve).  Mild Ao Sclerosis, Mild-Mod AI   TRANSTHORACIC ECHOCARDIOGRAM  08/'17; 10/'18    a) Mild conc LVH. EF 55-60%. No RWMA. Mod AI. Mechanical MV prosthesis functioning properly. LAD dilation.;; b)  EF 55-60%.  Mo AI.  Bileaflet Saint Jude mechanical MV with no paravalvular leak.  Severe LA dilation.  Minimally elevated PAP    Allergies  Allergen Reactions   Flexeril [Cyclobenzaprine] Diarrhea    Allergies as of 07/20/2021       Reactions   Flexeril [cyclobenzaprine] Diarrhea        Medication List        Accurate as of July 20, 2021  1:38 PM. If you have any questions, ask your nurse or doctor.          acetaminophen 500 MG tablet Commonly known as: TYLENOL Take 1,000 mg by mouth 3 (three) times daily as needed for mild pain.   bisoprolol 5 MG tablet Commonly known as: ZEBETA Take 1 tablet (5 mg total) by mouth daily.   ferrous sulfate 325 (65 FE) MG tablet Take 1 tablet (325 mg total) by mouth 2 (two) times daily with a meal.   fluticasone 50 MCG/ACT nasal spray Commonly known as: FLONASE Place 1 spray into both nostrils daily as needed for allergies or rhinitis.   hydrALAZINE 25 MG tablet Commonly known as: APRESOLINE Take 25 mg by mouth daily at lunch, may take an  extra tablet as needed for systolic BP greater than 716 mmHg daily   isosorbide mononitrate 30 MG 24 hr tablet Commonly known as: IMDUR Take 15 mg by mouth daily.   lactose free nutrition Liqd Take 237 mLs by mouth 2 (two) times daily between meals.   LORazepam 1 MG tablet Commonly known as: ATIVAN Take 0.5  tablets (0.5 mg total) by mouth daily as needed for anxiety.   MULTIVITAMIN PO Take 1 tablet by mouth daily.   oxyCODONE 5 MG immediate release tablet Commonly known as: Oxy IR/ROXICODONE Take 0.5 tablets (2.5 mg total) by mouth every 6 (six) hours as needed for severe pain.   polyethylene glycol 17 g packet Commonly known as: MIRALAX / GLYCOLAX Take 17 g by mouth daily as needed for mild constipation.   PreserVision AREDS 2 Caps Take 1 capsule by mouth daily.   warfarin 5 MG tablet Commonly known as: COUMADIN Take as directed by the anticoagulation clinic. If you are unsure how to take this medication, talk to your nurse or doctor. Original instructions: Take 1 tablet (5 mg total) by mouth daily.        Review of Systems  Constitutional:  Positive for fatigue. Negative for appetite change and fever.  HENT:  Positive for  hearing loss. Negative for congestion and voice change.   Eyes:  Negative for visual disturbance.  Respiratory:  Negative for cough and shortness of breath.   Cardiovascular:  Negative for leg swelling.  Gastrointestinal:  Negative for abdominal pain, constipation, nausea and vomiting.  Genitourinary:  Negative for dysuria, hematuria and urgency.       Incontinent of urine, uses condom catheter.   Musculoskeletal:  Positive for arthralgias and gait problem.  Skin:        Abd surgical scar. Dark chronic venous insufficiency skin changes BLE  Neurological:  Negative for speech difficulty, weakness and light-headedness.       Memory lapses.   Psychiatric/Behavioral:  Negative for behavioral problems and sleep disturbance. The patient is not nervous/anxious.    Immunization History  Administered Date(s) Administered   Influenza, High Dose Seasonal PF 04/20/2016   Influenza,inj,Quad PF,6+ Mos 03/28/2018   Influenza-Unspecified 03/30/2017   Moderna SARS-COV2 Booster Vaccination 12/03/2020   Moderna Sars-Covid-2 Vaccination 07/02/2019, 07/30/2019, 05/12/2020    Pneumococcal Conjugate-13 06/10/2014   Pneumococcal-Unspecified 02/02/2006   Tdap 06/10/2014   Zoster, Live 03/29/2015   Zoster, Unspecified 05/02/2015   Pertinent  Health Maintenance Due  Topic Date Due   INFLUENZA VACCINE  01/26/2021   Fall Risk 07/15/2021 07/15/2021 07/16/2021 07/16/2021 07/17/2021  Falls in the past year? - - - - -  Was there an injury with Fall? - - - - -  Fall Risk Category Calculator - - - - -  Fall Risk Category - - - - -  Patient Fall Risk Level _0   Patient at Risk for Falls Due to - - - - -  Fall risk Follow up - - - - -   Functional Status Survey:    Vitals:   07/20/21 1103  BP: 132/77  Pulse: 69  Temp: (!) 96.9 F (36.1 C)  SpO2: 99%   There is no height or weight on file to calculate BMI. Physical Exam Vitals and nursing note reviewed.  Constitutional:      Appearance: Normal appearance.  HENT:     Head: Normocephalic and atraumatic.     Mouth/Throat:     Mouth: Mucous membranes are moist.  Eyes:     Extraocular Movements: Extraocular movements intact.     Conjunctiva/sclera: Conjunctivae normal.     Pupils: Pupils are equal, round, and reactive to light.  Cardiovascular:     Rate and Rhythm: Normal rate. Rhythm irregular.     Heart sounds: Murmur heard.  Pulmonary:     Effort: Pulmonary effort is normal.     Breath sounds: No rales.  Abdominal:     General: Bowel sounds are normal.     Palpations: Abdomen is soft.     Tenderness: There is no abdominal tenderness.  Musculoskeletal:     Cervical back: Normal range of motion and neck supple.     Right lower leg: No edema.     Left lower leg: No edema.  Skin:    General: Skin is warm and dry.     Findings: Bruising present.     Comments: dark pigmented venous insufficiency skin changes BLE. Abd surgical scar.    Neurological:     General: No focal deficit present.     Mental Status: He is  alert. Mental status is at baseline.     Motor: No weakness.     Coordination: Coordination normal.  Gait: Gait abnormal.  Psychiatric:        Mood and Affect: Mood normal.        Behavior: Behavior normal.        Thought Content: Thought content normal.    Labs reviewed: Recent Labs    07/04/21 0109 07/05/21 0053 07/06/21 0156 07/06/21 1216 07/08/21 0110 07/09/21 0123 07/10/21 0506 07/11/21 0150 07/13/21 0317  NA 134* 138  --    < > 132* 131* 130* 131* 132*  K 4.1 4.2  --    < > 4.7 4.0 4.2 4.2 4.1  CL 108 108  --    < > 105 101 101 103 102  CO2 23 23  --    < > 21* _0 21*  GLUCOSE 133* 96  --    < > 94 104* 86 94 87  BUN 27* 21  --    < > 31* 30* 28* 29* 28*  CREATININE 1.20 1.16  --    < > 1.12 1.20 1.28* 1.29* 1.16  CALCIUM 8.1* 8.1*  --    < > 7.8* 7.8* 8.1* 7.8* 8.3*  MG 2.0 1.8  --    < > 1.8 1.9 1.9  --   --   PHOS 2.1* 3.3 3.0  --   --   --   --   --   --    < > = values in this interval not displayed.   Recent Labs    07/08/21 0110 07/09/21 0123 07/10/21 0506  AST 22 26 35  ALT _1 ALKPHOS 44 44 45  BILITOT 0.5 0.5 0.6  PROT 4.7* 5.0* 5.1*  ALBUMIN 1.9* 2.0* 2.1*   Recent Labs    07/08/21 0110 07/08/21 0617 07/09/21 0123 07/09/21 1304 07/10/21 0506 07/10/21 1345 07/14/21 0115 07/15/21 0108 07/16/21 0139  WBC 4.4   < > 4.4   < > 3.8*   < > 4.9 3.6* 3.4*  NEUTROABS 2.8  --  3.3  --  2.3  --   --   --   --   HGB 7.0*   < > 8.7*   < > 8.8*   < > 8.0* 8.7* 8.8*  HCT 22.1*   < > 25.3*   < > 26.3*   < > 25.6* 27.9* 27.9*  MCV 95.7   < > 90.7   < > 93.3   < > 96.2 95.9 95.9  PLT 136*   < > 196   < > 173   < > 225 206 215   < > = values in this interval not displayed.   Lab Results  Component Value Date   TSH 2.39 10/15/2020   No results found for: HGBA1C No results found for: CHOL, HDL, LDLCALC, LDLDIRECT, TRIG, CHOLHDL  Significant Diagnostic Results in last 30 days:  CT CHEST WO CONTRAST  Result Date: 07/02/2021 CLINICAL  DATA:  Colon cancer, staging. EXAM: CT CHEST WITHOUT CONTRAST TECHNIQUE: Multidetector CT imaging of the chest was performed following the standard protocol without IV contrast. COMPARISON:  No prior chest CT. FINDINGS: Cardiovascular: Advanced atherosclerosis of the thoracic aorta. No aortic aneurysm. Descending aorta is tortuous. Multi chamber cardiomegaly. Mitral valve replacement. There are coronary artery calcifications. No pericardial effusion. Prominence of the central pulmonary artery at 3.4 cm. Mediastinum/Nodes: No enlarged mediastinal lymph nodes, paucity of body and mediastinal fat limits detailed assessment. There is no bulky hilar adenopathy on this unenhanced exam. No gross esophageal wall thickening.  Upper esophagus slightly patulous. Lungs/Pleura: No pulmonary mass or suspicious nodule. Calcified granuloma in the anterior subpleural right middle lobe. Trace bilateral pleural effusions and associated atelectasis. Linear subsegmental atelectasis in the lingula. No confluent consolidation. Upper Abdomen: Free air in the upper abdomen likely related to recent partial colectomy. High-density material in the gallbladder was not seen on prior abdominal CT and likely reflect bones are carious excretion of contrast. Musculoskeletal: Prior median sternotomy. L1 inferior endplate compression fracture. No blastic or destructive lytic lesion. Generalized paucity of body fat. IMPRESSION: 1. No evidence of metastatic disease in the thorax. 2. Trace bilateral pleural effusions and associated atelectasis. 3. Multi chamber cardiomegaly. Coronary artery calcifications. Prominence of the central pulmonary artery suggesting pulmonary arterial hypertension. Aortic Atherosclerosis (ICD10-I70.0). Electronically Signed   By: Keith Rake M.D.   On: 07/02/2021 22:09   US RENAL  Result Date: 07/06/2021 CLINICAL DATA:  Acute kidney injury EXAM: RENAL / URINARY TRACT ULTRASOUND COMPLETE COMPARISON:  CT 06/24/2021  FINDINGS: Right Kidney: Renal measurements: 10.5 x 4.2 x 4.8 cm = volume: 110.2 mL. Cortex appears slightly echogenic. No hydronephrosis. Small cysts measuring up to 11 mm at the midpole. Left Kidney: Renal measurements: 9.9 x 4.8 x 4.1 cm = volume: 94.7 mL. Cortex appears slightly echogenic. No mass or hydronephrosis. Bladder: Slightly thick-walled urinary bladder Other: None. IMPRESSION: 1. Kidneys are slightly echogenic consistent with medical renal disease. 2. Negative for hydronephrosis 3. Slightly thick-walled urinary bladder Electronically Signed   By: Donavan Foil M.D.   On: 07/06/2021 16:42   ECHOCARDIOGRAM COMPLETE  Result Date: 07/10/2021    ECHOCARDIOGRAM REPORT   Patient Name:   ERIEL DOYON [facility] Date of Exam: 07/10/2021 Medical Rec #:  [id number]           Height:       72.0 in Accession #:    [id number]          Weight:       149.9 lb Date of Birth:  27-Mar-1926           BSA:          1.885 m Patient Age:    88 years            BP:           142/78 mmHg Patient Gender: M                   HR:           96 bpm. Exam Location:  Inpatient Procedure: 2D Echo Indications:    Mitral valve replacement  History:        Patient has prior history of Echocardiogram examinations, most                 recent 01/18/2019. Aortic Valve Disease; Risk                 Factors:Dyslipidemia. Mitral valve replacement.  Sonographer:    Arlyss Gandy Referring Phys: [place] Comments: Patient supine IMPRESSIONS  1. Left ventricular ejection fraction, by estimation, is 55 to 60%. The left ventricle has normal function. The left ventricle has no regional wall motion abnormalities. There is mild concentric left ventricular hypertrophy. Diastolic function is indeterminant due to Afib.  2. Right ventricular systolic function is mildly reduced. The right ventricular size is mildly enlarged. There is normal pulmonary artery systolic pressure. The estimated right ventricular systolic pressure is 95.2  mmHg.  3. Left atrial size  was severely dilated.  4. Right atrial size was severely dilated.  5. The mitral valve has been repaired/replaced. Patient is s/p St Jude bileaflet mechanical valve replacement. Echo findings are consistent with normal structure and function of the mitral valve prosthesis. Mean gradient is 100mHg at 99bpm.  6. The aortic valve is tricuspid. There is mild calcification of the aortic valve. There is mild thickening of the aortic valve. Aortic valve regurgitation is moderate.  7. Aortic dilatation noted. There is borderline dilatation of the aortic root, measuring 37 mm.  8. The inferior vena cava is normal in size with <50% respiratory variability, suggesting right atrial pressure of 8 mmHg. Comparison(s): Compared to prior TTE in 2020, the AR now appears moderate. Otherwise no significant change. Mean mitral valve gradient stable at ~5-769mg. FINDINGS  Left Ventricle: Left ventricular ejection fraction, by estimation, is 55 to 60%. The left ventricle has normal function. The left ventricle has no regional wall motion abnormalities. The left ventricular internal cavity size was normal in size. There is  mild concentric left ventricular hypertrophy. Abnormal (paradoxical) septal motion consistent with post-operative status. Diastolic function is indeterminant due to Afib. Right Ventricle: The right ventricular size is mildly enlarged. No increase in right ventricular wall thickness. Right ventricular systolic function is mildly reduced. There is normal pulmonary artery systolic pressure. The tricuspid regurgitant velocity  is 2.76 m/s, and with an assumed right atrial pressure of 3 mmHg, the estimated right ventricular systolic pressure is 3356.2mHg. Left Atrium: Left atrial size was severely dilated. Right Atrium: Right atrial size was severely dilated. Pericardium: There is no evidence of pericardial effusion. Mitral Valve: Mean gradient 26m77m at HR 99bpm. The mitral valve has been  repaired/replaced. There is a St. Jude present in the mitral position. Echo findings are consistent with normal structure and function of the mitral valve prosthesis. MV peak gradient, 14.4 mmHg. The mean mitral valve gradient is 6.0 mmHg. Tricuspid Valve: The tricuspid valve is normal in structure. Tricuspid valve regurgitation is mild. Aortic Valve: The aortic valve is tricuspid. There is mild calcification of the aortic valve. There is mild thickening of the aortic valve. Aortic valve regurgitation is moderate. Aortic regurgitation PHT measures 364 msec. Aortic valve mean gradient measures 5.5 mmHg. Aortic valve peak gradient measures 10.1 mmHg. Aortic valve area, by VTI measures 1.94 cm. Pulmonic Valve: The pulmonic valve was normal in structure. Pulmonic valve regurgitation is trivial. Aorta: Aortic dilatation noted. There is borderline dilatation of the aortic root, measuring 37 mm. Venous: The inferior vena cava is normal in size with less than 50% respiratory variability, suggesting right atrial pressure of 8 mmHg. IAS/Shunts: The atrial septum is grossly normal.  LEFT VENTRICLE PLAX 2D LVIDd:         4.20 cm   Diastology LVIDs:         3.10 cm   LV e' medial:    9.30 cm/s LV PW:         1.30 cm   LV E/e' medial:  14.7 LV IVS:        1.30 cm   LV e' lateral:   12.80 cm/s LVOT diam:     2.10 cm   LV E/e' lateral: 10.7 LV SV:         53 LV SV Index:   28 LVOT Area:     3.46 cm  RIGHT VENTRICLE             IVC RV Basal diam:  4.10 cm  IVC diam: 1.80 cm RV S prime:     10.70 cm/s TAPSE (M-mode): 1.6 cm LEFT ATRIUM              Index        RIGHT ATRIUM           Index LA diam:        5.00 cm  2.65 cm/m   RA Area:     29.70 cm LA Vol (A2C):   102.0 ml 54.11 ml/m  RA Volume:   98.90 ml  52.47 ml/m LA Vol (A4C):   103.0 ml 54.64 ml/m LA Biplane Vol: 104.0 ml 55.18 ml/m  AORTIC VALVE AV Area (Vmax):    1.98 cm AV Area (Vmean):   1.95 cm AV Area (VTI):     1.94 cm AV Vmax:           159.00 cm/s AV  Vmean:          108.350 cm/s AV VTI:            0.274 m AV Peak Grad:      10.1 mmHg AV Mean Grad:      5.5 mmHg LVOT Vmax:         90.95 cm/s LVOT Vmean:        61.150 cm/s LVOT VTI:          0.154 m LVOT/AV VTI ratio: 0.56 AI PHT:            364 msec  AORTA Ao Root diam: 3.70 cm Ao Asc diam:  3.10 cm MITRAL VALVE                TRICUSPID VALVE MV Area (PHT): 2.87 cm     TR Peak grad:   30.5 mmHg MV Area VTI:   1.60 cm     TR Vmax:        276.00 cm/s MV Peak grad:  14.4 mmHg MV Mean grad:  6.0 mmHg     SHUNTS MV Vmax:       1.90 m/s     Systemic VTI:  0.15 m MV Vmean:      112.0 cm/s   Systemic Diam: 2.10 cm MV Decel Time: 264 msec MV E velocity: 137.00 cm/s MV A velocity: 0.00 cm/s Gwyndolyn Kaufman MD Electronically signed by Gwyndolyn Kaufman MD Signature Date/Time: 07/10/2021/3:10:59 PM    Final    VAS [place] UPPER EXTREMITY VENOUS DUPLEX  Result Date: 07/10/2021 UPPER VENOUS STUDY  Patient Name:  AMOGH KOMATSU  Date of Exam:   07/10/2021 Medical Rec #: [id number]            Accession #:    [id number] Date of Birth: 08-16-1925            Patient Gender: M Patient Age:   33 years Exam Location:  [facility] Procedure:      VAS [place] UPPER EXTREMITY VENOUS DUPLEX Referring Phys: Inda Merlin --------------------------------------------------------------------------------  Indications: Pain, Swelling, and Erythema Comparison Study: No prior study Performing Technologist: Maudry Mayhew MHA, RDMS, RVT, RDCS  Examination Guidelines: A complete evaluation includes B-mode imaging, spectral Doppler, color Doppler, and power Doppler as needed of all accessible portions of each vessel. Bilateral testing is considered an integral part of a complete examination. Limited examinations for reoccurring indications may be performed as noted.  Right Findings: +----------+------------+---------+-----------+----------+--------------------+  RIGHT      Compressible Phasicity Spontaneous Properties       Summary          +----------+------------+---------+-----------+----------+--------------------+  IJV            Full        Yes        Yes                                      +----------+------------+---------+-----------+----------+--------------------+  Subclavian     Full        Yes        Yes                                      +----------+------------+---------+-----------+----------+--------------------+  Axillary       Full        Yes        Yes                                      +----------+------------+---------+-----------+----------+--------------------+  Brachial       Full        Yes        Yes                                      +----------+------------+---------+-----------+----------+--------------------+  Radial         Full                                                            +----------+------------+---------+-----------+----------+--------------------+  Ulnar          Full                                                            +----------+------------+---------+-----------+----------+--------------------+  Cephalic       None                                       Acute, mid forearm                                                                     only          +----------+------------+---------+-----------+----------+--------------------+  Basilic        Full                                                            +----------+------------+---------+-----------+----------+--------------------+  Left Findings: +----------+------------+---------+-----------+----------+-------+  LEFT       Compressible Phasicity Spontaneous Properties Summary  +----------+------------+---------+-----------+----------+-------+  Subclavian                 Yes        Yes                         +----------+------------+---------+-----------+----------+-------+  Summary:  Right: No evidence of deep vein thrombosis in the upper extremity. Findings consistent with acute superficial vein thrombosis involving the  right cephalic vein.  Left: No evidence of thrombosis in the subclavian.  *See table(s) above for measurements and observations.  Diagnosing physician: Jamelle Haring Electronically signed by Jamelle Haring on 07/10/2021 at 2:49:56 PM.    Final    CT ANGIO GI BLEED  Result Date: 06/24/2021 CLINICAL DATA:  Evaluate for lower GI tract bleed. EXAM: CTA ABDOMEN AND PELVIS WITHOUT AND WITH CONTRAST TECHNIQUE: Multidetector CT imaging of the abdomen and pelvis was performed using the standard protocol during bolus administration of intravenous contrast. Multiplanar reconstructed images and MIPs were obtained and reviewed to evaluate the vascular anatomy. CONTRAST:  65m OMNIPAQUE IOHEXOL 350 MG/ML SOLN COMPARISON:  12/15/20 FINDINGS: VASCULAR Aorta: Normal caliber aorta without aneurysm, dissection, vasculitis or significant stenosis. Aortic atherosclerotic calcifications. None Celiac: Calcified plaque with approximately 50% stenosis at the origin of the celiac artery noted. SMA: Calcified plaque with approximately 60% stenosis at the origin of the SMA. Renals: Calcified plaque at the origin of both renal arteries results in greater than 50% stenosis bilaterally. IMA: Appears patent with greater than 50% stenosis at the origin. Inflow: Patent without evidence of aneurysm, dissection, vasculitis or significant stenosis. Proximal Outflow: Bilateral common femoral and visualized portions of the superficial and profunda femoral arteries are patent. There is non flow limiting dissection involving the right external iliac artery, image 601 through image 663/11. Veins: No obvious venous abnormality within the limitations of this arterial phase study. Review of the MIP images confirms the above findings. NON-VASCULAR Lower chest: Cardiac enlargement.  Lung bases are clear. Hepatobiliary: No acute abnormality. Low-density structure within segment 8 of the liver is technically too small to characterize measuring 7 mm, image 29/17.  Gallbladder appears normal. No bile duct dilatation. Pancreas: Unremarkable. No pancreatic ductal dilatation or surrounding inflammatory changes. Spleen: Normal in size without focal abnormality. Adrenals/Urinary Tract: Normal adrenal glands. No kidney stones or obstructive uropathy identified. Several subcentimeter low-density foci are identified within the right renal cortex which are technically too small to characterize measuring less than 1 cm. Urinary bladder is largely obscured by beam hardening artifact from patient's bilateral hip arthroplasty devices. Stomach/Bowel: Within the mid transverse colon there is a masslike intraluminal filling defect with avid arterial phase enhancement measuring 4.1 by 3.7 by 3.5 cm, image 42/13 and image 90/5. No additional abnormal areas of intraluminal contrast enhancement identified. No bowel wall thickening, inflammation or distension. Lymphatic: No enlarged lymph nodes. Reproductive: Prostate gland is largely obscured by streak artifact from bilateral hip arthroplasty devices. Other: Large left inguinal hernia contains a nonobstructed loop of large bowel. Musculoskeletal: Status post bilateral hip arthroplasty. Scoliosis and degenerative disc disease is identified. IMPRESSION: 1. Avid arterial phase enhancing masslike filling defect within the mid transverse colon is identified. Although conceivably this could represent a large blood clot the diagnosis of exclusion is a primary colonic neoplasm. Further evaluation with colonoscopy is recommended. 2. Extensive atherosclerotic disease noted with stenosis noted at the origin of the celiac artery, SMA, IMA and  both renal arteries. 3. 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 4. Large left inguinal hernia contains a nonobstructed loop of large bowel. 5. Aortic Atherosclerosis (ICD10-I70.0). These results were called by telephone at the time of interpretation on 06/24/2021 at 9:47 pm to provider  [facility] , who verbally acknowledged these results. Electronically Signed   By: Kerby Moors M.D.   On: 06/24/2021 21:48    Assessment/Plan: Essential hypertension Blood pressure is controlled,  takes Hydralazine, Bisoprolol, Imdur  Hyponatremia SIADH, Na 132 07/13/21, update CMP/eGFR.   BPH (benign prostatic hyperplasia) Urinary frequency, off Tamsulosin, had CT at urology.   CKD (chronic kidney disease) stage 3, GFR 30-59 ml/min (HCC) Bun/creat 28/1.16 07/13/21  Superficial vein thrombosis  resolved right arm cellulitis  Acute blood loss anemia blood loss, on Iron, Hgb 8.0 07/14/21, s/p PRBC transfusion,  repeat CBC/diff.   Insomnia Anxiety, takes prn Lorazepam.    Longstanding persistent atrial fibrillation: CHA2DS2-VASc Score 3 heart rate is in control, takes Bisoprolol  S/P MVR (mitral valve replacement) F/u cardiology, 07/18/21 IRN 2.9(goal is 2.5-3.5) for mechanical mitral valve placement. Continue Coumadin 67m qd.   Transverse colon mass-s/p exploratory laparotomy-colectomy-pathology positive for adenocarcinoma Hospitalized 06/24/21-07/17/21 for GI bleed, he was found to have a large colon mass(T2n0 adenocarcinoma)-underwent colectomy 06/30/21-f/u oncology. Post op was complicated with oozing from anastomotic site, s/p RRBC transfusion    Family/ staff Communication: plan of care reviewed with the patient and charge nurse.   Labs/tests ordered:  none  Time spend 35 minutes.

## 2021-07-20 NOTE — Assessment & Plan Note (Signed)
Urinary frequency, off Tamsulosin, had CT at urology.

## 2021-07-20 NOTE — Assessment & Plan Note (Signed)
Hospitalized 06/24/21-07/17/21 for GI bleed, he was found to have a large colon mass(T2n0 adenocarcinoma)-underwent colectomy 06/30/21-f/u oncology. Post op was complicated with oozing from anastomotic site, s/p RRBC transfusion

## 2021-07-21 ENCOUNTER — Telehealth: Payer: Self-pay

## 2021-07-21 ENCOUNTER — Non-Acute Institutional Stay (SKILLED_NURSING_FACILITY): Payer: Medicare Other | Admitting: Internal Medicine

## 2021-07-21 ENCOUNTER — Encounter: Payer: Self-pay | Admitting: Internal Medicine

## 2021-07-21 DIAGNOSIS — I10 Essential (primary) hypertension: Secondary | ICD-10-CM | POA: Diagnosis not present

## 2021-07-21 DIAGNOSIS — N183 Chronic kidney disease, stage 3 unspecified: Secondary | ICD-10-CM

## 2021-07-21 DIAGNOSIS — D62 Acute posthemorrhagic anemia: Secondary | ICD-10-CM

## 2021-07-21 DIAGNOSIS — Z9049 Acquired absence of other specified parts of digestive tract: Secondary | ICD-10-CM | POA: Diagnosis not present

## 2021-07-21 DIAGNOSIS — E871 Hypo-osmolality and hyponatremia: Secondary | ICD-10-CM | POA: Diagnosis not present

## 2021-07-21 DIAGNOSIS — I4811 Longstanding persistent atrial fibrillation: Secondary | ICD-10-CM | POA: Diagnosis not present

## 2021-07-21 DIAGNOSIS — Z952 Presence of prosthetic heart valve: Secondary | ICD-10-CM | POA: Diagnosis not present

## 2021-07-21 NOTE — Telephone Encounter (Signed)
Patient's wife called stating Dr. Lyndel Safe had a conversation with the patient and wife isn't sure if they came to the same decision, but what they want is for the patient to continue getting INR checks with Dr. [facility] and to have the results sent to Dr. Lyndel Safe. She states they have been going there for years and would like to continue to do so.   To Dr. Lyndel Safe

## 2021-07-21 NOTE — Progress Notes (Signed)
Provider:  Veleta Miners MD Location:   [place] Room Number: 35 Place of Service:  SNF (31)  PCP: [name], MD Patient Care Team: [name], MD as PCP - General (Internal Medicine) Leonie Man, MD as PCP - Cardiology (Cardiology)  Extended Emergency Contact Information Primary Emergency Contact: [facility] Address: [address]  [id number]          [place] [address] [place] Phone: [phone] Relation: Spouse Secondary Emergency Contact: Yar[place], [place] Mobile Phone: [phone] Relation: Son  Code Status: DNR Goals of Care: Advanced Directive information Advanced Directives 07/12/2021  Does Patient Have a Medical Advance Directive? -  Type of Advance Directive -  Does patient want to make changes to medical advance directive? -  Copy of [place] in Chart? -  Would patient like information on creating a medical advance directive? No - Patient declined  Pre-existing out of facility DNR order (yellow form or pink MOST form) -      Chief Complaint  Patient presents with   New Admit To SNF    Admission to SNF    HPI: Patient is a 88 y.o. male seen today for admission to SNF for therapy  Admitted from 12/28-01/20 for lower GI Bleeding due to colon mass underwent partial colectomy on 01/03  Patient has a history of PAF, mechanical mitral valve is 97 on Coumadin, BPH, previous history of right and left hip arthroplasty, hypertension 2/28-3/8 Closed Periprosthetic displaced fracture of distal epiphysis of left femur ORIF on 03/03  Went to ED for GI bleeding Was found to have Colon Mass on Colonoscopy Underwent Colectomy on 01/03 Post op had Bleeding due to Oozing from Anastomotic  site Required number of Infusions Also was treated with Keflex for Superficial Vein thrombosis. Patient did not have any new complains. He is already getting up with therapy and walking  Denies  any pain  Only issue is Black stools with abdominal distension   Past Medical History:  Diagnosis Date   Anemia    leakoppenia   BPH (benign prostatic hypertrophy)    Bullous pemphigoid    [name], March 2011, right forearm squamous cell carcinoma   Chronic anticoagulation    systemic   Colon polyp    transverse, 2002   History of peptic ulcer    remote, 3/95   Hx of actinic keratosis    Hx of basal cell carcinoma    Hx of squamous cell carcinoma of skin    Hyperlipidemia    Left inguinal hernia    Moderate aortic insufficiency 2009   audible aortic insufficiency on 1/09 echo   PAF (paroxysmal atrial fibrillation) ([place]) 01/17/2014   On Warfarin.   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 Jude mechanical valve   ORIF FEMUR FRACTURE Left 08/28/2020   Procedure: OPEN REDUCTION INTERNAL FIXATION (ORIF) DISTAL FEMUR FRACTURE;  Surgeon: Rod Can, MD;  Location: [facility];  Service: Orthopedics;  Laterality: Left;   PARTIAL COLECTOMY N/A 06/30/2021   Procedure: PARTIAL COLECTOMY;  Surgeon: Clovis Riley, MD;  Location: [facility];  Service: General;  Laterality: N/A;   TOTAL HIP ARTHROPLASTY Right 10/12/2017   Procedure: RIGHT TOTAL HIP ARTHROPLASTY ANTERIOR APPROACH;  Surgeon: Gaynelle Arabian, MD;  Location: [facility];   Service: Orthopedics;  Laterality: Right;   TRANSTHORACIC ECHOCARDIOGRAM  12/2018   Unable to assess diastolic function because of A. fib. Normal RV function, but moderately elevated RVSP.  Severe biatrial enlargement. S/P St Jude bileaflet mechanical MVR that appears to be functioning normally. Mitral valve regurgitation cannot assess due to mechanical valve shadowing. MV Mean grad: 7.0 mmHg MV Area (PHT): 3.38 cm (stable for valve).  Mild Ao Sclerosis, Mild-Mod AI   TRANSTHORACIC ECHOCARDIOGRAM  08/'17; 10/'18    a) Mild conc LVH. EF 55-60%. No RWMA. Mod AI. Mechanical MV prosthesis functioning properly. LAD dilation.;; b)  EF 55-60%.  Mo AI.  Bileaflet Saint Jude mechanical MV with no paravalvular leak.  Severe LA dilation.  Minimally elevated PAP    reports that he quit smoking about 63 years ago. His smoking use included cigarettes. He started smoking about 76 years ago. He has a 13.00 pack-year smoking history. He has never used smokeless tobacco. He reports current alcohol use. He reports that he does not use drugs. Social History   Socioeconomic History   Marital status: Married    Spouse name: Not on file   Number of children: 2   Years of education: Not on file   Highest education level: Not on file  Occupational History   Occupation: retired  Tobacco Use   Smoking status: Former    Packs/day: 1.00    Years: 13.00    Pack years: 13.00    Types: Cigarettes    Start date: 1947    Quit date: 1960    Years since quitting: 63.1   Smokeless tobacco: Never  Vaping Use   Vaping Use: Never used  Substance and Sexual Activity   Alcohol use: Yes    Alcohol/week: 0.0 standard drinks    Comment: 1-2 drinks per day   Drug use: Never   Sexual activity: Not on file  Other Topics Concern   Not on file  Social History Narrative   Patient lives at [facility], With his wife - Meryl Crutch.   Social Determinants of Health   Financial Resource Strain: Not on file  Food Insecurity:  Not on file  Transportation Needs: Not on file  Physical Activity: Not on file  Stress: Not on file  Social Connections: Not on file  Intimate Partner Violence: Not on file    Functional Status Survey:    Family History  Problem Relation Age of Onset   Hypertension Mother    Lung cancer Sister    COPD Brother    Cancer Brother    Other Sister        polio   Lupus Son     Health Maintenance  Topic Date Due   Zoster Vaccines- Shingrix (1 of 2) 08/14/1944   Pneumonia Vaccine 42+ Years old (2 - PPSV23 if available, else PCV20) 06/11/2015   INFLUENZA VACCINE  01/26/2021   COVID-19 Vaccine (4 - Booster for Moderna series) 01/28/2021   TETANUS/TDAP  06/10/2024   HPV VACCINES  Aged Out    Allergies  Allergen Reactions   Flexeril [Cyclobenzaprine] Diarrhea    Allergies as of 07/21/2021       Reactions   Flexeril [cyclobenzaprine] Diarrhea        Medication List        Accurate as of July 21, 2021  9:50 AM. If you have any questions, ask your nurse or doctor.          acetaminophen 500 MG tablet Commonly known as: TYLENOL Take 1,000 mg by mouth 3 (three) times daily as needed for mild pain.   bisoprolol 5 MG tablet Commonly known as: ZEBETA Take 1 tablet (5 mg total) by mouth daily.   ferrous sulfate 325 (65 FE) MG tablet Take 1 tablet (325 mg total) by mouth 2 (two) times daily with a meal.   fluticasone 50 MCG/ACT nasal spray Commonly known as: FLONASE Place 1 spray into both nostrils daily as needed for allergies or rhinitis.   hydrALAZINE 25 MG tablet Commonly known as: APRESOLINE Take 25 mg by mouth daily at lunch, may take an  extra tablet as needed for systolic BP greater than 962 mmHg daily   isosorbide mononitrate 30 MG 24 hr tablet Commonly known as: IMDUR Take 15 mg by mouth daily.   lactose free nutrition Liqd Take 237 mLs by mouth 2 (two) times daily between meals.   LORazepam 1 MG tablet Commonly known as: ATIVAN Take 0.5  tablets (0.5 mg total) by mouth daily as needed for anxiety.   MULTIVITAMIN PO Take 1 tablet by mouth daily.   oxyCODONE 5 MG immediate release tablet Commonly known as: Oxy IR/ROXICODONE Take 0.5 tablets (2.5 mg total) by mouth every 6 (six) hours as needed for severe pain.   polyethylene glycol 17 g packet Commonly known as: MIRALAX / GLYCOLAX Take 17 g by mouth daily as needed for mild constipation.   PreserVision AREDS 2 Caps Take 1 capsule by mouth daily.   warfarin 5 MG tablet Commonly known as: COUMADIN Take as directed by the anticoagulation clinic. If you are unsure how to take this medication, talk to your nurse or doctor. Original instructions: Take 1 tablet (5 mg total) by mouth daily. What changed: additional instructions        Review of Systems  Constitutional:  Positive for activity change. Negative for appetite change and unexpected weight change.  HENT: Negative.    Respiratory:  Negative for cough and shortness of breath.   Cardiovascular:  Positive for leg swelling.  Gastrointestinal:  Positive for abdominal distention. Negative for constipation.  Genitourinary:  Negative for frequency.  Musculoskeletal:  Positive for gait problem. Negative for arthralgias and myalgias.  Skin: Negative.  Negative for rash.  Neurological:  Positive for weakness. Negative for dizziness.  Psychiatric/Behavioral:  Negative for confusion and sleep disturbance.   All other systems reviewed and are negative.  Vitals:   07/21/21 0938  BP: (!) 160/70  Pulse: 60  Resp: 16  Temp: 98.4 F (36.9 C)  SpO2: 98%  Weight: 149 lb (67.6 kg)  Height: 6' (1.829 m)   Body mass index is 20.21 kg/m. Physical Exam Vitals reviewed.  Constitutional:      Appearance: Normal appearance.  HENT:     Head: Normocephalic.     Nose: Nose normal.     Mouth/Throat:     Mouth: Mucous membranes are moist.     Pharynx: Oropharynx is clear.  Eyes:  Pupils: Pupils are equal, round, and  reactive to light.  Cardiovascular:     Rate and Rhythm: Normal rate and regular rhythm.     Pulses: Normal pulses.     Heart sounds: Murmur heard.  Pulmonary:     Effort: Pulmonary effort is normal. No respiratory distress.     Breath sounds: Normal breath sounds. No rales.  Abdominal:     General: Abdomen is flat. Bowel sounds are normal.     Palpations: Abdomen is soft.     Comments: Healing incision around the Umbilical area  Musculoskeletal:        General: Swelling present.     Cervical back: Neck supple.     Comments: With Chronic Venous changes Has 2 skin tears in both Legs  Skin:    General: Skin is warm.  Neurological:     General: No focal deficit present.     Mental Status: He is alert and oriented to person, place, and time.  Psychiatric:        Mood and Affect: Mood normal.        Thought Content: Thought content normal.    Labs reviewed: Basic Metabolic Panel: Recent Labs    07/04/21 0109 07/05/21 0053 07/06/21 0156 07/06/21 1216 07/08/21 0110 07/09/21 0123 07/10/21 0506 07/11/21 0150 07/13/21 0317  NA 134* 138  --    < > 132* 131* 130* 131* 132*  K 4.1 4.2  --    < > 4.7 4.0 4.2 4.2 4.1  CL 108 108  --    < > 105 101 101 103 102  CO2 23 23  --    < > 21* 23 23 24  21*  GLUCOSE 133* 96  --    < > 94 104* 86 94 87  BUN 27* 21  --    < > 31* 30* 28* 29* 28*  CREATININE 1.20 1.16  --    < > 1.12 1.20 1.28* 1.29* 1.16  CALCIUM 8.1* 8.1*  --    < > 7.8* 7.8* 8.1* 7.8* 8.3*  MG 2.0 1.8  --    < > 1.8 1.9 1.9  --   --   PHOS 2.1* 3.3 3.0  --   --   --   --   --   --    < > = values in this interval not displayed.   Liver Function Tests: Recent Labs    07/08/21 0110 07/09/21 0123 07/10/21 0506  AST 22 26 35  ALT 14 18 23   ALKPHOS 44 44 45  BILITOT 0.5 0.5 0.6  PROT 4.7* 5.0* 5.1*  ALBUMIN 1.9* 2.0* 2.1*   Recent Labs    06/24/21 1220  LIPASE 34   No results for input(s): AMMONIA in the last 8760 hours. CBC: Recent Labs    07/08/21 0110  07/08/21 0617 07/09/21 0123 07/09/21 1304 07/10/21 0506 07/10/21 1345 07/14/21 0115 07/15/21 0108 07/16/21 0139  WBC 4.4   < > 4.4   < > 3.8*   < > 4.9 3.6* 3.4*  NEUTROABS 2.8  --  3.3  --  2.3  --   --   --   --   HGB 7.0*   < > 8.7*   < > 8.8*   < > 8.0* 8.7* 8.8*  HCT 22.1*   < > 25.3*   < > 26.3*   < > 25.6* 27.9* 27.9*  MCV 95.7   < > 90.7   < > 93.3   < >  96.2 95.9 95.9  PLT 136*   < > 196   < > 173   < > 225 206 215   < > = values in this interval not displayed.   Cardiac Enzymes: No results for input(s): CKTOTAL, CKMB, CKMBINDEX, TROPONINI in the last 8760 hours. BNP: Invalid input(s): POCBNP No results found for: HGBA1C Lab Results  Component Value Date   TSH 2.39 10/15/2020   Lab Results  Component Value Date   UTMLYYTK35 465 06/25/2021   Lab Results  Component Value Date   FOLATE >24.0 02/05/2021   Lab Results  Component Value Date   IRON 41 (L) 07/03/2021   TIBC 223 (L) 07/03/2021   FERRITIN 118 07/03/2021    Imaging and Procedures obtained prior to SNF admission: CT ANGIO GI BLEED  Result Date: 06/24/2021 CLINICAL DATA:  Evaluate for lower GI tract bleed. EXAM: CTA ABDOMEN AND PELVIS WITHOUT AND WITH CONTRAST TECHNIQUE: Multidetector CT imaging of the abdomen and pelvis was performed using the standard protocol during bolus administration of intravenous contrast. Multiplanar reconstructed images and MIPs were obtained and reviewed to evaluate the vascular anatomy. CONTRAST:  61mL OMNIPAQUE IOHEXOL 350 MG/ML SOLN COMPARISON:  12/15/20 FINDINGS: VASCULAR Aorta: Normal caliber aorta without aneurysm, dissection, vasculitis or significant stenosis. Aortic atherosclerotic calcifications. None Celiac: Calcified plaque with approximately 50% stenosis at the origin of the celiac artery noted. SMA: Calcified plaque with approximately 60% stenosis at the origin of the SMA. Renals: Calcified plaque at the origin of both renal arteries results in greater than 50%  stenosis bilaterally. IMA: Appears patent with greater than 50% stenosis at the origin. Inflow: Patent without evidence of aneurysm, dissection, vasculitis or significant stenosis. Proximal Outflow: Bilateral common femoral and visualized portions of the superficial and profunda femoral arteries are patent. There is non flow limiting dissection involving the right external iliac artery, image 601 through image 663/11. Veins: No obvious venous abnormality within the limitations of this arterial phase study. Review of the MIP images confirms the above findings. NON-VASCULAR Lower chest: Cardiac enlargement.  Lung bases are clear. Hepatobiliary: No acute abnormality. Low-density structure within segment 8 of the liver is technically too small to characterize measuring 7 mm, image 29/17. Gallbladder appears normal. No bile duct dilatation. Pancreas: Unremarkable. No pancreatic ductal dilatation or surrounding inflammatory changes. Spleen: Normal in size without focal abnormality. Adrenals/Urinary Tract: Normal adrenal glands. No kidney stones or obstructive uropathy identified. Several subcentimeter low-density foci are identified within the right renal cortex which are technically too small to characterize measuring less than 1 cm. Urinary bladder is largely obscured by beam hardening artifact from patient's bilateral hip arthroplasty devices. Stomach/Bowel: Within the mid transverse colon there is a masslike intraluminal filling defect with avid arterial phase enhancement measuring 4.1 by 3.7 by 3.5 cm, image 42/13 and image 90/5. No additional abnormal areas of intraluminal contrast enhancement identified. No bowel wall thickening, inflammation or distension. Lymphatic: No enlarged lymph nodes. Reproductive: Prostate gland is largely obscured by streak artifact from bilateral hip arthroplasty devices. Other: Large left inguinal hernia contains a nonobstructed loop of large bowel. Musculoskeletal: Status post  bilateral hip arthroplasty. Scoliosis and degenerative disc disease is identified. IMPRESSION: 1. Avid arterial phase enhancing masslike filling defect within the mid transverse colon is identified. Although conceivably this could represent a large blood clot the diagnosis of exclusion is a primary colonic neoplasm. Further evaluation with colonoscopy is recommended. 2. Extensive atherosclerotic disease noted with stenosis noted at the origin of the celiac artery, SMA,  IMA and both renal arteries. 3. 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 4. Large left inguinal hernia contains a nonobstructed loop of large bowel. 5. Aortic Atherosclerosis (ICD10-I70.0). These results were called by telephone at the time of interpretation on 06/24/2021 at 9:47 pm to provider [facility] , who verbally acknowledged these results. Electronically Signed   By: Kerby Moors M.D.   On: 06/24/2021 21:48    Assessment/Plan Essential hypertension On Bisoprolol and Hydralazine Will Continue to monitor Acute blood loss anemia On Iron Repeat CBC in 1 week Hyponatremia Repeat Sodium in 1 week Stage 3 chronic kidney disease,  Creat stable S/P colectomy for Colon Mass Has follow up with Oncology Margins were clear per pathology  Longstanding persistent atrial fibrillation: CHA2DS2-VASc Score 3 On Coumadin and follows with Cardiology S/P MVR (mitral valve replacement) On Coumadin Managed by Coumadin Clinic Anxiety On Ativan PRn CAD On Imdur LE edema with Chronic Venous changes Was taken off Lasix in the hospital  Family/ staff Communication:   Labs/tests ordered: CBC,CMP in 1 week

## 2021-07-22 ENCOUNTER — Ambulatory Visit (INDEPENDENT_AMBULATORY_CARE_PROVIDER_SITE_OTHER): Payer: Medicare Other

## 2021-07-22 ENCOUNTER — Telehealth: Payer: Self-pay

## 2021-07-22 ENCOUNTER — Other Ambulatory Visit: Payer: Self-pay

## 2021-07-22 DIAGNOSIS — Z952 Presence of prosthetic heart valve: Secondary | ICD-10-CM

## 2021-07-22 DIAGNOSIS — Z5181 Encounter for therapeutic drug level monitoring: Secondary | ICD-10-CM

## 2021-07-22 DIAGNOSIS — I480 Paroxysmal atrial fibrillation: Secondary | ICD-10-CM

## 2021-07-22 DIAGNOSIS — I059 Rheumatic mitral valve disease, unspecified: Secondary | ICD-10-CM

## 2021-07-22 LAB — POCT INR: INR: 6.2 — AB (ref 2.0–3.0)

## 2021-07-22 NOTE — Patient Instructions (Signed)
HOLD TONIGHT, Thursday and Friday then Continue taking warfarin 1 tablet daily except 0.5 tablet every Monday and Friday.  -Recheck INR in 4 days. Coumadin Clinic [facility] [phone]

## 2021-07-22 NOTE — Telephone Encounter (Signed)
Please call pts spouse. Pt was seen in office today and per spouse pt has lost information given today.

## 2021-07-22 NOTE — Telephone Encounter (Signed)
I spoke to pt's wife and clarified INR visit.

## 2021-07-27 ENCOUNTER — Ambulatory Visit (INDEPENDENT_AMBULATORY_CARE_PROVIDER_SITE_OTHER): Payer: Medicare Other

## 2021-07-27 ENCOUNTER — Other Ambulatory Visit: Payer: Self-pay

## 2021-07-27 ENCOUNTER — Telehealth: Payer: Self-pay | Admitting: Licensed Clinical Social Worker

## 2021-07-27 DIAGNOSIS — Z5181 Encounter for therapeutic drug level monitoring: Secondary | ICD-10-CM

## 2021-07-27 DIAGNOSIS — Z952 Presence of prosthetic heart valve: Secondary | ICD-10-CM | POA: Diagnosis not present

## 2021-07-27 DIAGNOSIS — I059 Rheumatic mitral valve disease, unspecified: Secondary | ICD-10-CM

## 2021-07-27 DIAGNOSIS — I480 Paroxysmal atrial fibrillation: Secondary | ICD-10-CM

## 2021-07-27 LAB — POCT INR: INR: 3.2 — AB (ref 2.0–3.0)

## 2021-07-27 NOTE — Patient Instructions (Signed)
Continue taking warfarin 1 tablet daily except 0.5 tablet every Monday and Friday.  -Recheck INR in 6 weeks. Coumadin Clinic [facility] [phone]

## 2021-07-27 NOTE — Telephone Encounter (Signed)
Pt utilizes [employer]- I reached out to [employer] who was able to alert driver that pt is ready for pick up earlier than scheduled time of 2:30pm. This process may change after 2/1. Remain available in future if any questions/concerns.   Westley Hummer, MSW, [place]  [phone]- work cell phone (preferred) [phone]- desk phone

## 2021-07-29 ENCOUNTER — Telehealth: Payer: Self-pay

## 2021-07-29 NOTE — Telephone Encounter (Signed)
Pt called and they stated that the facility [facility] in [facility] has been giving him incorrect warfarin since it comes in a blister pack. Pt wanted to report it to [facility] and let him know that they got it straightened out. Will forward to michael dapp RN as the pt requested.

## 2021-07-30 ENCOUNTER — Telehealth: Payer: Self-pay

## 2021-07-30 NOTE — Telephone Encounter (Signed)
Patient called to inform me that the Rehab center had been incorrectly dosing his Coumadin, which caused elevated INR.  He had it corrected and we will check INR 2/9.

## 2021-08-06 ENCOUNTER — Ambulatory Visit (INDEPENDENT_AMBULATORY_CARE_PROVIDER_SITE_OTHER): Payer: Medicare Other

## 2021-08-06 ENCOUNTER — Other Ambulatory Visit: Payer: Self-pay

## 2021-08-06 DIAGNOSIS — R2681 Unsteadiness on feet: Secondary | ICD-10-CM | POA: Diagnosis not present

## 2021-08-06 DIAGNOSIS — Z952 Presence of prosthetic heart valve: Secondary | ICD-10-CM

## 2021-08-06 DIAGNOSIS — Z5181 Encounter for therapeutic drug level monitoring: Secondary | ICD-10-CM

## 2021-08-06 DIAGNOSIS — I480 Paroxysmal atrial fibrillation: Secondary | ICD-10-CM | POA: Diagnosis not present

## 2021-08-06 DIAGNOSIS — M6281 Muscle weakness (generalized): Secondary | ICD-10-CM | POA: Diagnosis not present

## 2021-08-06 DIAGNOSIS — Z9181 History of falling: Secondary | ICD-10-CM | POA: Diagnosis not present

## 2021-08-06 DIAGNOSIS — K922 Gastrointestinal hemorrhage, unspecified: Secondary | ICD-10-CM | POA: Diagnosis not present

## 2021-08-06 DIAGNOSIS — I059 Rheumatic mitral valve disease, unspecified: Secondary | ICD-10-CM | POA: Diagnosis not present

## 2021-08-06 DIAGNOSIS — K929 Disease of digestive system, unspecified: Secondary | ICD-10-CM | POA: Diagnosis not present

## 2021-08-06 DIAGNOSIS — Z483 Aftercare following surgery for neoplasm: Secondary | ICD-10-CM | POA: Diagnosis not present

## 2021-08-06 LAB — POCT INR: INR: 4.4 — AB (ref 2.0–3.0)

## 2021-08-06 NOTE — Patient Instructions (Signed)
HOLD TONIGHT ONLY and then decrease to 1 tablet daily except 0.5 tablet every Monday, Wednesday and Friday.  -Recheck INR in 4 weeks. Coumadin Clinic [facility] [phone]

## 2021-08-09 DIAGNOSIS — Z483 Aftercare following surgery for neoplasm: Secondary | ICD-10-CM | POA: Diagnosis not present

## 2021-08-09 DIAGNOSIS — M6281 Muscle weakness (generalized): Secondary | ICD-10-CM | POA: Diagnosis not present

## 2021-08-09 DIAGNOSIS — K929 Disease of digestive system, unspecified: Secondary | ICD-10-CM | POA: Diagnosis not present

## 2021-08-09 DIAGNOSIS — K922 Gastrointestinal hemorrhage, unspecified: Secondary | ICD-10-CM | POA: Diagnosis not present

## 2021-08-09 DIAGNOSIS — R2681 Unsteadiness on feet: Secondary | ICD-10-CM | POA: Diagnosis not present

## 2021-08-09 DIAGNOSIS — Z9181 History of falling: Secondary | ICD-10-CM | POA: Diagnosis not present

## 2021-08-10 DIAGNOSIS — R2681 Unsteadiness on feet: Secondary | ICD-10-CM | POA: Diagnosis not present

## 2021-08-10 DIAGNOSIS — Z483 Aftercare following surgery for neoplasm: Secondary | ICD-10-CM | POA: Diagnosis not present

## 2021-08-10 DIAGNOSIS — Z9181 History of falling: Secondary | ICD-10-CM | POA: Diagnosis not present

## 2021-08-10 DIAGNOSIS — K922 Gastrointestinal hemorrhage, unspecified: Secondary | ICD-10-CM | POA: Diagnosis not present

## 2021-08-10 DIAGNOSIS — K929 Disease of digestive system, unspecified: Secondary | ICD-10-CM | POA: Diagnosis not present

## 2021-08-10 DIAGNOSIS — M6281 Muscle weakness (generalized): Secondary | ICD-10-CM | POA: Diagnosis not present

## 2021-08-11 DIAGNOSIS — Z9181 History of falling: Secondary | ICD-10-CM | POA: Diagnosis not present

## 2021-08-11 DIAGNOSIS — R2681 Unsteadiness on feet: Secondary | ICD-10-CM | POA: Diagnosis not present

## 2021-08-11 DIAGNOSIS — K929 Disease of digestive system, unspecified: Secondary | ICD-10-CM | POA: Diagnosis not present

## 2021-08-11 DIAGNOSIS — M6281 Muscle weakness (generalized): Secondary | ICD-10-CM | POA: Diagnosis not present

## 2021-08-11 DIAGNOSIS — K922 Gastrointestinal hemorrhage, unspecified: Secondary | ICD-10-CM | POA: Diagnosis not present

## 2021-08-11 DIAGNOSIS — Z483 Aftercare following surgery for neoplasm: Secondary | ICD-10-CM | POA: Diagnosis not present

## 2021-08-12 DIAGNOSIS — K922 Gastrointestinal hemorrhage, unspecified: Secondary | ICD-10-CM | POA: Diagnosis not present

## 2021-08-12 DIAGNOSIS — Z9181 History of falling: Secondary | ICD-10-CM | POA: Diagnosis not present

## 2021-08-12 DIAGNOSIS — Z483 Aftercare following surgery for neoplasm: Secondary | ICD-10-CM | POA: Diagnosis not present

## 2021-08-12 DIAGNOSIS — M6281 Muscle weakness (generalized): Secondary | ICD-10-CM | POA: Diagnosis not present

## 2021-08-12 DIAGNOSIS — R2681 Unsteadiness on feet: Secondary | ICD-10-CM | POA: Diagnosis not present

## 2021-08-12 DIAGNOSIS — K929 Disease of digestive system, unspecified: Secondary | ICD-10-CM | POA: Diagnosis not present

## 2021-08-13 ENCOUNTER — Other Ambulatory Visit: Payer: Self-pay

## 2021-08-13 ENCOUNTER — Inpatient Hospital Stay: Payer: Medicare Other | Attending: Hematology

## 2021-08-13 ENCOUNTER — Inpatient Hospital Stay (HOSPITAL_BASED_OUTPATIENT_CLINIC_OR_DEPARTMENT_OTHER): Payer: Medicare Other | Admitting: Hematology

## 2021-08-13 ENCOUNTER — Encounter: Payer: Self-pay | Admitting: Hematology

## 2021-08-13 VITALS — BP 122/69 | HR 79 | Temp 97.7°F | Resp 17 | Ht 72.0 in | Wt 149.7 lb

## 2021-08-13 DIAGNOSIS — C184 Malignant neoplasm of transverse colon: Secondary | ICD-10-CM

## 2021-08-13 DIAGNOSIS — Z85038 Personal history of other malignant neoplasm of large intestine: Secondary | ICD-10-CM | POA: Insufficient documentation

## 2021-08-13 DIAGNOSIS — D649 Anemia, unspecified: Secondary | ICD-10-CM | POA: Insufficient documentation

## 2021-08-13 DIAGNOSIS — C44702 Unspecified malignant neoplasm of skin of right lower limb, including hip: Secondary | ICD-10-CM | POA: Diagnosis not present

## 2021-08-13 DIAGNOSIS — D500 Iron deficiency anemia secondary to blood loss (chronic): Secondary | ICD-10-CM | POA: Diagnosis not present

## 2021-08-13 DIAGNOSIS — Z9049 Acquired absence of other specified parts of digestive tract: Secondary | ICD-10-CM | POA: Diagnosis not present

## 2021-08-13 DIAGNOSIS — Z85828 Personal history of other malignant neoplasm of skin: Secondary | ICD-10-CM | POA: Diagnosis not present

## 2021-08-13 LAB — CBC WITH DIFFERENTIAL (CANCER CENTER ONLY)
Abs Immature Granulocytes: 0.02 10*3/uL (ref 0.00–0.07)
Basophils Absolute: 0 10*3/uL (ref 0.0–0.1)
Basophils Relative: 1 %
Eosinophils Absolute: 0.1 10*3/uL (ref 0.0–0.5)
Eosinophils Relative: 3 %
HCT: 29.3 % — ABNORMAL LOW (ref 39.0–52.0)
Hemoglobin: 9.3 g/dL — ABNORMAL LOW (ref 13.0–17.0)
Immature Granulocytes: 1 %
Lymphocytes Relative: 17 %
Lymphs Abs: 0.5 10*3/uL — ABNORMAL LOW (ref 0.7–4.0)
MCH: 31.2 pg (ref 26.0–34.0)
MCHC: 31.7 g/dL (ref 30.0–36.0)
MCV: 98.3 fL (ref 80.0–100.0)
Monocytes Absolute: 0.4 10*3/uL (ref 0.1–1.0)
Monocytes Relative: 13 %
Neutro Abs: 2 10*3/uL (ref 1.7–7.7)
Neutrophils Relative %: 65 %
Platelet Count: 148 10*3/uL — ABNORMAL LOW (ref 150–400)
RBC: 2.98 MIL/uL — ABNORMAL LOW (ref 4.22–5.81)
RDW: 18.2 % — ABNORMAL HIGH (ref 11.5–15.5)
WBC Count: 3.1 10*3/uL — ABNORMAL LOW (ref 4.0–10.5)
nRBC: 0 % (ref 0.0–0.2)

## 2021-08-13 LAB — CMP (CANCER CENTER ONLY)
ALT: 11 U/L (ref 0–44)
AST: 23 U/L (ref 15–41)
Albumin: 3.6 g/dL (ref 3.5–5.0)
Alkaline Phosphatase: 57 U/L (ref 38–126)
Anion gap: 4 — ABNORMAL LOW (ref 5–15)
BUN: 33 mg/dL — ABNORMAL HIGH (ref 8–23)
CO2: 30 mmol/L (ref 22–32)
Calcium: 8.8 mg/dL — ABNORMAL LOW (ref 8.9–10.3)
Chloride: 99 mmol/L (ref 98–111)
Creatinine: 1.56 mg/dL — ABNORMAL HIGH (ref 0.61–1.24)
GFR, Estimated: 41 mL/min — ABNORMAL LOW (ref >60)
Glucose, Bld: 102 mg/dL — ABNORMAL HIGH (ref 70–99)
Potassium: 4.2 mmol/L (ref 3.5–5.1)
Sodium: 133 mmol/L — ABNORMAL LOW (ref 135–145)
Total Bilirubin: 0.6 mg/dL (ref 0.3–1.2)
Total Protein: 6.7 g/dL (ref 6.5–8.1)

## 2021-08-13 NOTE — Progress Notes (Signed)
[place]   [phone] [phone]   Clinic Follow up Note   Patient Care Team: [name], MD as PCP - General (Internal Medicine) Leonie Man, MD as PCP - Cardiology (Cardiology)  Date of Service:  08/13/2021  CHIEF COMPLAINT: f/u of colon cancer  CURRENT THERAPY:  Surveillance  ASSESSMENT & PLAN:  [name] is a 88 y.o. male with   1. 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 Work up CT showed mass-like filling defect within mid transverse colon. Inpatient colonoscopy on 06/27/21 under Dr. Watt Climes showed a large partially-obstructing transverse colon mass. Pathology confirmed poorly differentiated adenocarcinoma, MMR abnormal with loss of expression in MLH1 and PMS2. -partial colectomy on 06/30/21 under Dr. Windle Guard showed 5 cm poorly differentiated adenocarcinoma invading muscularis propria. All nodes negative  -staging chest CT, abdomen and pelvis was negative for metastatic disease. -I reviewed his prognosis and risk of recurrence today. Given his stage I disease, his prognosis is very good, risk of recurrence is low (<20%), no adjuvant chemotherapy is indicated -I discussed cancer surveillance, will see him every 4 to 6 months with lab.  Routine surveillance CT scan is optional for stage I colon cancer, due to his advanced age, I will only do CT scan if there is clinical concern for recurrence. -I will request BRAF and [facility] methylation test to see if he has sporadic disease vs Lynch syndrome   2. Normocytic Anemia -hgb upon presentation to ED (06/24/21) was 8.6. -iron on 07/03/21 was 41, ferritin was 118. Today's results pending -he required blood transfusion in the hospital on 06/28/21. -hgb improved to 9.2 today (08/13/21)   PLAN: -lab and f/u in 4 months   No problem-specific Assessment & Plan notes found for this  encounter.   SUMMARY OF ONCOLOGIC HISTORY: Oncology History Overview Note   Cancer Staging  Cancer of transverse colon  Staging form: Colon and Rectum, AJCC 8th Edition - Pathologic stage from 06/30/2021: Stage I (pT2, pN0, cM0) - Signed by [name], MD on 07/03/2021    Cancer of transverse colon   06/24/2021 Imaging   EXAM: CTA ABDOMEN AND PELVIS WITHOUT AND WITH CONTRAST  MPRESSION: 1. Avid arterial phase enhancing masslike filling defect within the mid transverse colon is identified. Although conceivably this could represent a large blood clot the diagnosis of exclusion is a primary colonic neoplasm. Further evaluation with colonoscopy is recommended. 2. Extensive atherosclerotic disease noted with stenosis noted at the origin of the celiac artery, SMA, IMA and both renal arteries. 3. 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 4. Large left inguinal hernia contains a nonobstructed loop of large bowel. 5. Aortic Atherosclerosis (ICD10-I70.0).   06/27/2021 Procedure   Colonoscopy, Dr. Watt Climes  Impression: - Likely malignant partially obstructing tumor in the mid transverse colon. Biopsied. - The examination was otherwise normal on direct and retroflexion views. Unable to advance the scope past the tumor   06/27/2021 Initial Biopsy   FINAL MICROSCOPIC DIAGNOSIS:   A. TRANSVERSE COLON, BIOPSY:  - Poorly differentiated adenocarcinoma, see comment    ADDENDUM:  Mismatch Repair Protein (IHC)  SUMMARY INTERPRETATION: ABNORMAL   IHC EXPRESSION RESULTS   TEST           RESULT  MLH1:          LOSS OF NUCLEAR EXPRESSION  MSH2:  Preserved nuclear expression  MSH6:          Preserved nuclear expression  PMS2:          LOSS OF NUCLEAR EXPRESSION    06/30/2021 Cancer Staging   Staging form: Colon and Rectum, AJCC 8th Edition - Pathologic stage from 06/30/2021: Stage I (pT2, pN0, cM0) - Signed by [name], MD on 07/03/2021 Stage prefix: Initial  diagnosis Total positive nodes: 0 Residual tumor (R): R0 - None    06/30/2021 Definitive Surgery   FINAL MICROSCOPIC DIAGNOSIS:   A. COLON, TRANSVERSE, PARTIAL COLECTOMY:  Poorly differentiated adenocarcinoma invading muscularis propria.  See oncology table for details.    07/02/2021 Imaging   EXAM: CT CHEST WITHOUT CONTRAST  IMPRESSION: 1. No evidence of metastatic disease in the thorax. 2. Trace bilateral pleural effusions and associated atelectasis. 3. Multi chamber cardiomegaly. Coronary artery calcifications. Prominence of the central pulmonary artery suggesting pulmonary arterial hypertension.   07/03/2021 Initial Diagnosis   Cancer of transverse colon ([place])      INTERVAL HISTORY:  Richard Spears is here for a follow up of colon cancer. He was last seen by me on 07/04/21 while he was in the hospital. He presents to the clinic alone. He reports he is getting stronger since his discharge. He reports some soft/loose stool. He notes he is eating well and gaining his weight back. He notes he has been released from f/u with Dr. Windle Guard. He is living at [facility] independent living with his wife.   All other systems were reviewed with the patient and are negative.  MEDICAL HISTORY:  Past Medical History:  Diagnosis Date   Anemia    leakoppenia   BPH (benign prostatic hypertrophy)    Bullous pemphigoid    Wilhemina Spears, March 2011, right forearm squamous cell carcinoma   Chronic anticoagulation    systemic   Colon polyp    transverse, 2002   History of peptic ulcer    remote, 3/95   Hx of actinic keratosis    Hx of basal cell carcinoma    Hx of squamous cell carcinoma of skin    Hyperlipidemia    Left inguinal hernia    Moderate aortic insufficiency 2009   audible aortic insufficiency on 1/09 echo   PAF (paroxysmal atrial fibrillation) ([place]) 01/17/2014   On Warfarin.   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 Jude mechanical valve   ORIF FEMUR FRACTURE Left 08/28/2020   Procedure: OPEN REDUCTION INTERNAL FIXATION (ORIF) DISTAL FEMUR FRACTURE;  Surgeon: Rod Can, MD;  Location: [place];  Service: Orthopedics;  Laterality: Left;   PARTIAL COLECTOMY N/A 06/30/2021   Procedure: PARTIAL COLECTOMY;  Surgeon: Clovis Riley, MD;  Location: [place];  Service: General;  Laterality: N/A;   TOTAL HIP ARTHROPLASTY Right 10/12/2017   Procedure: RIGHT TOTAL HIP ARTHROPLASTY ANTERIOR APPROACH;  Surgeon: Gaynelle Arabian, MD;  [facility];  Service: Orthopedics;  Laterality: Right;   TRANSTHORACIC ECHOCARDIOGRAM  12/2018   Unable to assess diastolic function because of A. fib. Normal RV function, but moderately elevated RVSP.  Severe biatrial enlargement. S/P St Jude bileaflet mechanical MVR that appears to be functioning normally. Mitral valve regurgitation cannot assess due to mechanical valve shadowing. MV Mean grad: 7.0 mmHg MV Area (PHT): 3.38 cm (stable for valve).  Mild Ao Sclerosis, Mild-Mod AI   TRANSTHORACIC ECHOCARDIOGRAM  08/'17; 10/'18    a) Mild conc LVH. EF 55-60%. No RWMA. Mod AI. Mechanical MV prosthesis functioning properly. LAD dilation.;; b)  EF 55-60%.  Mo AI.  Bileaflet Saint Jude mechanical MV with no paravalvular leak.  Severe LA dilation.  Minimally  elevated PAP    I have reviewed the social history and family history with the patient and they are unchanged from previous note.  ALLERGIES:  is allergic to flexeril [cyclobenzaprine].  MEDICATIONS:  Current Outpatient Medications  Medication Sig Dispense Refill   acetaminophen (TYLENOL) 500 MG tablet Take 1,000 mg by mouth 3 (three) times daily as needed for mild pain.     bisoprolol (ZEBETA) 5 MG tablet Take 1 tablet (5 mg total) by mouth daily. 90 tablet 3   ferrous sulfate 325 (65 FE) MG tablet Take 1 tablet (325 mg total) by mouth 2 (two) times daily with a meal. (Patient taking differently: Take 325 mg by mouth daily.)  3   fluticasone (FLONASE) 50 MCG/ACT nasal spray Place 1 spray into both nostrils daily as needed for allergies or rhinitis.     hydrALAZINE (APRESOLINE) 25 MG tablet Take 25 mg by mouth daily at lunch, may take an  extra tablet as needed for systolic BP greater than 086 mmHg daily 180 tablet 3   isosorbide mononitrate (IMDUR) 30 MG 24 hr tablet Take 15 mg by mouth daily.     lactose free nutrition (BOOST) LIQD Take 237 mLs by mouth 2 (two) times daily between meals.     LORazepam (ATIVAN) 1 MG tablet Take 0.5 tablets (0.5 mg total) by mouth daily as needed for anxiety. 10 tablet 0   Multiple Vitamins-Minerals (MULTIVITAMIN PO) Take 1 tablet by mouth daily.     Multiple Vitamins-Minerals (PRESERVISION AREDS 2) CAPS Take 1 capsule by mouth daily. 90 capsule 0   oxyCODONE (OXY IR/ROXICODONE) 5 MG immediate release tablet Take 0.5 tablets (2.5 mg total) by mouth every 6 (six) hours as needed for severe pain. 10 tablet 0   polyethylene glycol (MIRALAX / GLYCOLAX) 17 g packet Take 17 g by mouth daily as needed for mild constipation.     warfarin (COUMADIN) 5 MG tablet Take 1 tablet (5 mg total) by mouth daily. (Patient taking differently: Take 5 mg by mouth daily. Take 1 and 1/2 tablet by mouth once daily as directed) 120 tablet 0   No current facility-administered  medications for this visit.    PHYSICAL EXAMINATION: ECOG PERFORMANCE STATUS: 1 - Symptomatic but completely ambulatory  Vitals:   08/13/21 1248  BP: 122/69  Pulse: 79  Resp: 17  Temp: 97.7 F (36.5 C)  SpO2: 99%   Wt Readings from Last 3 Encounters:  08/13/21 149 lb 11.2 oz (67.9 kg)  07/21/21 149 lb (67.6 kg)  06/27/21 149 lb 14.6 oz (68 kg)     GENERAL:alert, no distress and  comfortable SKIN: skin color normal, no rashes or significant lesions EYES: normal, Conjunctiva are pink and non-injected, sclera clear  NEURO: alert & oriented x 3 with fluent speech  LABORATORY DATA:  I have reviewed the data as listed CBC Latest Ref Rng & Units 08/13/2021 07/16/2021 07/15/2021  WBC 4.0 - 10.5 K/uL 3.1(L) 3.4(L) 3.6(L)  Hemoglobin 13.0 - 17.0 g/dL 9.3(L) 8.8(L) 8.7(L)  Hematocrit 39.0 - 52.0 % 29.3(L) 27.9(L) 27.9(L)  Platelets 150 - 400 K/uL 148(L) 215 206     CMP Latest Ref Rng & Units 08/13/2021 07/13/2021 07/11/2021  Glucose 70 - 99 mg/dL 102(H) 87 94  BUN 8 - 23 mg/dL 33(H) 28(H) 29(H)  Creatinine 0.61 - 1.24 mg/dL 1.56(H) 1.16 1.29(H)  Sodium 135 - 145 mmol/L 133(L) 132(L) 131(L)  Potassium 3.5 - 5.1 mmol/L 4.2 4.1 4.2  Chloride 98 - 111 mmol/L 99 102 103  CO2 22 - 32 mmol/L 30 21(L) 24  Calcium 8.9 - 10.3 mg/dL 8.8(L) 8.3(L) 7.8(L)  Total Protein 6.5 - 8.1 g/dL 6.7 - -  Total Bilirubin 0.3 - 1.2 mg/dL 0.6 - -  Alkaline Phos 38 - 126 U/L 57 - -  AST 15 - 41 U/L 23 - -  ALT 0 - 44 U/L 11 - -      RADIOGRAPHIC STUDIES: I have personally reviewed the radiological images as listed and agreed with the findings in the report. No results found.    Orders Placed This Encounter  Procedures   CEA (IN HOUSE-[facility])    Standing Status:   Standing    Number of Occurrences:   5    Standing Expiration Date:   08/12/2022   Ferritin    Standing Status:   Standing    Number of Occurrences:   20    Standing Expiration Date:   08/12/2022   Iron and Iron Binding Capacity  ([facility] only)    Standing Status:   Future    Standing Expiration Date:   08/12/2022   All questions were answered. The patient knows to call the clinic with any problems, questions or concerns. No barriers to learning was detected. The total time spent in the appointment was 30 minutes.     [name], MD 08/13/2021   I, Wilburn Mylar, am acting as scribe for [name], MD.   I have reviewed the above documentation for accuracy and completeness, and I agree with the above.

## 2021-08-14 ENCOUNTER — Other Ambulatory Visit: Payer: Self-pay

## 2021-08-14 DIAGNOSIS — C184 Malignant neoplasm of transverse colon: Secondary | ICD-10-CM | POA: Diagnosis not present

## 2021-08-14 DIAGNOSIS — D500 Iron deficiency anemia secondary to blood loss (chronic): Secondary | ICD-10-CM | POA: Diagnosis not present

## 2021-08-14 DIAGNOSIS — C44702 Unspecified malignant neoplasm of skin of right lower limb, including hip: Secondary | ICD-10-CM | POA: Diagnosis not present

## 2021-08-17 DIAGNOSIS — Z9181 History of falling: Secondary | ICD-10-CM | POA: Diagnosis not present

## 2021-08-17 DIAGNOSIS — R2681 Unsteadiness on feet: Secondary | ICD-10-CM | POA: Diagnosis not present

## 2021-08-17 DIAGNOSIS — M6281 Muscle weakness (generalized): Secondary | ICD-10-CM | POA: Diagnosis not present

## 2021-08-17 DIAGNOSIS — Z483 Aftercare following surgery for neoplasm: Secondary | ICD-10-CM | POA: Diagnosis not present

## 2021-08-17 DIAGNOSIS — K929 Disease of digestive system, unspecified: Secondary | ICD-10-CM | POA: Diagnosis not present

## 2021-08-17 DIAGNOSIS — K922 Gastrointestinal hemorrhage, unspecified: Secondary | ICD-10-CM | POA: Diagnosis not present

## 2021-08-18 DIAGNOSIS — Z483 Aftercare following surgery for neoplasm: Secondary | ICD-10-CM | POA: Diagnosis not present

## 2021-08-18 DIAGNOSIS — K922 Gastrointestinal hemorrhage, unspecified: Secondary | ICD-10-CM | POA: Diagnosis not present

## 2021-08-18 DIAGNOSIS — Z9181 History of falling: Secondary | ICD-10-CM | POA: Diagnosis not present

## 2021-08-18 DIAGNOSIS — K929 Disease of digestive system, unspecified: Secondary | ICD-10-CM | POA: Diagnosis not present

## 2021-08-18 DIAGNOSIS — M6281 Muscle weakness (generalized): Secondary | ICD-10-CM | POA: Diagnosis not present

## 2021-08-18 DIAGNOSIS — R2681 Unsteadiness on feet: Secondary | ICD-10-CM | POA: Diagnosis not present

## 2021-08-20 DIAGNOSIS — K929 Disease of digestive system, unspecified: Secondary | ICD-10-CM | POA: Diagnosis not present

## 2021-08-20 DIAGNOSIS — Z483 Aftercare following surgery for neoplasm: Secondary | ICD-10-CM | POA: Diagnosis not present

## 2021-08-20 DIAGNOSIS — K922 Gastrointestinal hemorrhage, unspecified: Secondary | ICD-10-CM | POA: Diagnosis not present

## 2021-08-20 DIAGNOSIS — R2681 Unsteadiness on feet: Secondary | ICD-10-CM | POA: Diagnosis not present

## 2021-08-20 DIAGNOSIS — M6281 Muscle weakness (generalized): Secondary | ICD-10-CM | POA: Diagnosis not present

## 2021-08-20 DIAGNOSIS — Z9181 History of falling: Secondary | ICD-10-CM | POA: Diagnosis not present

## 2021-08-21 DIAGNOSIS — M6281 Muscle weakness (generalized): Secondary | ICD-10-CM | POA: Diagnosis not present

## 2021-08-21 DIAGNOSIS — K929 Disease of digestive system, unspecified: Secondary | ICD-10-CM | POA: Diagnosis not present

## 2021-08-21 DIAGNOSIS — K922 Gastrointestinal hemorrhage, unspecified: Secondary | ICD-10-CM | POA: Diagnosis not present

## 2021-08-21 DIAGNOSIS — Z9181 History of falling: Secondary | ICD-10-CM | POA: Diagnosis not present

## 2021-08-21 DIAGNOSIS — Z483 Aftercare following surgery for neoplasm: Secondary | ICD-10-CM | POA: Diagnosis not present

## 2021-08-21 DIAGNOSIS — R2681 Unsteadiness on feet: Secondary | ICD-10-CM | POA: Diagnosis not present

## 2021-09-01 ENCOUNTER — Other Ambulatory Visit: Payer: Self-pay | Admitting: Hematology

## 2021-09-01 ENCOUNTER — Telehealth: Payer: Self-pay

## 2021-09-01 MED ORDER — TRAMADOL HCL 50 MG PO TABS: 50.0000 mg | ORAL_TABLET | Freq: Three times a day (TID) | ORAL | 0 refills | Status: DC | PRN

## 2021-09-01 NOTE — Telephone Encounter (Signed)
Spoke with pt regarding lower back pain and abdominal pain when he tries to get up out of the chair.  Pain started about 2 wks ago.  Pt describes pain as a dull intermittent pain mostly occurs with movement.  Pt states he's losing energy.  Pt states he's taking Tylenol for the pain.  Pt is applying heat to his back.  Pt is requesting something stronger for pain.  Pt denied having prescription for Oxycodone IR.  Pt stated he's extremely fatigue.  Pt stated he's not driving right now and would like the prescription to be sent to [facility].  Explained to pt that this will be several days before he's able to get the pain medication.   ?

## 2021-09-02 ENCOUNTER — Telehealth: Payer: Self-pay | Admitting: Adult Health

## 2021-09-02 NOTE — Telephone Encounter (Signed)
.  Called patient to schedule appointment per 3/6 inbasket, patient is aware of date and time.  ? ?

## 2021-09-07 ENCOUNTER — Ambulatory Visit (INDEPENDENT_AMBULATORY_CARE_PROVIDER_SITE_OTHER): Payer: Medicare Other

## 2021-09-07 ENCOUNTER — Other Ambulatory Visit: Payer: Self-pay

## 2021-09-07 DIAGNOSIS — Z952 Presence of prosthetic heart valve: Secondary | ICD-10-CM | POA: Diagnosis not present

## 2021-09-07 DIAGNOSIS — I480 Paroxysmal atrial fibrillation: Secondary | ICD-10-CM | POA: Diagnosis not present

## 2021-09-07 DIAGNOSIS — Z5181 Encounter for therapeutic drug level monitoring: Secondary | ICD-10-CM | POA: Diagnosis not present

## 2021-09-07 DIAGNOSIS — I059 Rheumatic mitral valve disease, unspecified: Secondary | ICD-10-CM | POA: Diagnosis not present

## 2021-09-07 LAB — POCT INR: INR: 2.9 (ref 2.0–3.0)

## 2021-09-07 NOTE — Patient Instructions (Signed)
-  1 tablet daily except 0.5 tablet every Monday, Wednesday and Friday.  ?-Recheck INR in 6 weeks. [facility] [phone] ?-[facility] [phone] ?

## 2021-09-09 ENCOUNTER — Inpatient Hospital Stay: Payer: Medicare Other | Attending: Hematology

## 2021-09-09 ENCOUNTER — Other Ambulatory Visit: Payer: Self-pay

## 2021-09-09 ENCOUNTER — Encounter: Payer: Self-pay | Admitting: Adult Health

## 2021-09-09 ENCOUNTER — Inpatient Hospital Stay (HOSPITAL_BASED_OUTPATIENT_CLINIC_OR_DEPARTMENT_OTHER): Payer: Medicare Other | Admitting: Adult Health

## 2021-09-09 VITALS — BP 139/70 | HR 66 | Temp 97.5°F | Resp 16 | Ht 72.0 in | Wt 143.1 lb

## 2021-09-09 DIAGNOSIS — R970 Elevated carcinoembryonic antigen [CEA]: Secondary | ICD-10-CM | POA: Insufficient documentation

## 2021-09-09 DIAGNOSIS — D500 Iron deficiency anemia secondary to blood loss (chronic): Secondary | ICD-10-CM

## 2021-09-09 DIAGNOSIS — C184 Malignant neoplasm of transverse colon: Secondary | ICD-10-CM

## 2021-09-09 DIAGNOSIS — C772 Secondary and unspecified malignant neoplasm of intra-abdominal lymph nodes: Secondary | ICD-10-CM | POA: Diagnosis not present

## 2021-09-09 DIAGNOSIS — R634 Abnormal weight loss: Secondary | ICD-10-CM | POA: Insufficient documentation

## 2021-09-09 DIAGNOSIS — R1030 Lower abdominal pain, unspecified: Secondary | ICD-10-CM | POA: Insufficient documentation

## 2021-09-09 DIAGNOSIS — D649 Anemia, unspecified: Secondary | ICD-10-CM | POA: Diagnosis not present

## 2021-09-09 LAB — CBC WITH DIFFERENTIAL (CANCER CENTER ONLY)
Abs Immature Granulocytes: 0.04 10*3/uL (ref 0.00–0.07)
Basophils Absolute: 0 10*3/uL (ref 0.0–0.1)
Basophils Relative: 0 %
Eosinophils Absolute: 0.1 10*3/uL (ref 0.0–0.5)
Eosinophils Relative: 2 %
HCT: 30.8 % — ABNORMAL LOW (ref 39.0–52.0)
Hemoglobin: 10.2 g/dL — ABNORMAL LOW (ref 13.0–17.0)
Immature Granulocytes: 1 %
Lymphocytes Relative: 10 %
Lymphs Abs: 0.4 10*3/uL — ABNORMAL LOW (ref 0.7–4.0)
MCH: 31.7 pg (ref 26.0–34.0)
MCHC: 33.1 g/dL (ref 30.0–36.0)
MCV: 95.7 fL (ref 80.0–100.0)
Monocytes Absolute: 0.4 10*3/uL (ref 0.1–1.0)
Monocytes Relative: 11 %
Neutro Abs: 3.1 10*3/uL (ref 1.7–7.7)
Neutrophils Relative %: 76 %
Platelet Count: 173 10*3/uL (ref 150–400)
RBC: 3.22 MIL/uL — ABNORMAL LOW (ref 4.22–5.81)
RDW: 16.1 % — ABNORMAL HIGH (ref 11.5–15.5)
WBC Count: 4 10*3/uL (ref 4.0–10.5)
nRBC: 0 % (ref 0.0–0.2)

## 2021-09-09 LAB — IRON AND IRON BINDING CAPACITY (CC-WL,HP ONLY)
Iron: 68 ug/dL (ref 45–182)
Saturation Ratios: 17 % — ABNORMAL LOW (ref 17.9–39.5)
TIBC: 395 ug/dL (ref 250–450)
UIBC: 327 ug/dL (ref 117–376)

## 2021-09-09 LAB — CEA (IN HOUSE-CHCC): CEA (CHCC-In House): 6.84 ng/mL — ABNORMAL HIGH (ref 0.00–5.00)

## 2021-09-09 LAB — CMP (CANCER CENTER ONLY)
ALT: 13 U/L (ref 0–44)
AST: 27 U/L (ref 15–41)
Albumin: 4.1 g/dL (ref 3.5–5.0)
Alkaline Phosphatase: 67 U/L (ref 38–126)
Anion gap: 6 (ref 5–15)
BUN: 40 mg/dL — ABNORMAL HIGH (ref 8–23)
CO2: 30 mmol/L (ref 22–32)
Calcium: 9.7 mg/dL (ref 8.9–10.3)
Chloride: 94 mmol/L — ABNORMAL LOW (ref 98–111)
Creatinine: 1.77 mg/dL — ABNORMAL HIGH (ref 0.61–1.24)
GFR, Estimated: 35 mL/min — ABNORMAL LOW (ref >60)
Glucose, Bld: 96 mg/dL (ref 70–99)
Potassium: 4.4 mmol/L (ref 3.5–5.1)
Sodium: 130 mmol/L — ABNORMAL LOW (ref 135–145)
Total Bilirubin: 0.7 mg/dL (ref 0.3–1.2)
Total Protein: 7.9 g/dL (ref 6.5–8.1)

## 2021-09-09 LAB — FERRITIN: Ferritin: 203 ng/mL (ref 24–336)

## 2021-09-09 NOTE — Progress Notes (Signed)
[facility] Follow up: ?  ? ?Richard Orn, MD ?[address] ?[place] [address] ? ? ?DIAGNOSIS:  Cancer Staging  ?Cancer of transverse colon  ?Staging form: Colon and Rectum, AJCC 8th Edition ?- Pathologic stage from 06/30/2021: Stage I (pT2, pN0, cM0) - Signed by [name], MD on 07/03/2021 ?Stage prefix: Initial diagnosis ?Total positive nodes: 0 ?Residual tumor (R): R0 - None ? ? ?SUMMARY OF ONCOLOGIC HISTORY: ?Oncology History Overview Note  ? Cancer Staging  ?Cancer of transverse colon  ?Staging form: Colon and Rectum, AJCC 8th Edition ?- Pathologic stage from 06/30/2021: Stage I (pT2, pN0, cM0) - Signed by [name], MD on 07/03/2021 ? ?  ?Cancer of transverse colon   ?06/24/2021 Imaging  ? EXAM: ?CTA ABDOMEN AND PELVIS WITHOUT AND WITH CONTRAST ? ?MPRESSION: ?1. Avid arterial phase enhancing masslike filling defect within the ?mid transverse colon is identified. Although conceivably this could ?represent a large blood clot the diagnosis of exclusion is a primary ?colonic neoplasm. Further evaluation with colonoscopy is ?recommended. ?2. Extensive atherosclerotic disease noted with stenosis noted at ?the origin of the celiac artery, SMA, IMA and both renal arteries. ?3. 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 ?4. Large left inguinal hernia contains a nonobstructed loop of large ?bowel. ?5. Aortic Atherosclerosis (ICD10-I70.0). ?  ?06/27/2021 Procedure  ? Colonoscopy, Dr. Watt Climes ? ?Impression: ?- Likely malignant partially obstructing tumor in the mid transverse colon. Biopsied. ?- The examination was otherwise normal on direct and retroflexion views. Unable to advance the scope past the tumor ?  ?06/27/2021 Initial Biopsy  ? FINAL MICROSCOPIC DIAGNOSIS:  ? ?A. TRANSVERSE COLON, BIOPSY:  ?- Poorly differentiated adenocarcinoma, see comment  ? ? ?ADDENDUM:  ?Mismatch Repair Protein (IHC)  ?SUMMARY INTERPRETATION: ABNORMAL  ? ?IHC EXPRESSION RESULTS   ? ?TEST           RESULT  ?MLH1:          LOSS OF NUCLEAR EXPRESSION  ?MSH2:          Preserved nuclear expression  ?MSH6:          Preserved nuclear expression  ?PMS2:          LOSS OF NUCLEAR EXPRESSION  ?  ?06/30/2021 Cancer Staging  ? Staging form: Colon and Rectum, AJCC 8th Edition ?- Pathologic stage from 06/30/2021: Stage I (pT2, pN0, cM0) - Signed by [name], MD on 07/03/2021 ?Stage prefix: Initial diagnosis ?Total positive nodes: 0 ?Residual tumor (R): R0 - None ? ?  ?06/30/2021 Definitive Surgery  ? FINAL MICROSCOPIC DIAGNOSIS:  ? ?A. COLON, TRANSVERSE, PARTIAL COLECTOMY:  ?Poorly differentiated adenocarcinoma invading muscularis propria.  ?See oncology table for details.  ?  ?07/02/2021 Imaging  ? EXAM: ?CT CHEST WITHOUT CONTRAST ? ?IMPRESSION: ?1. No evidence of metastatic disease in the thorax. ?2. Trace bilateral pleural effusions and associated atelectasis. ?3. Multi chamber cardiomegaly. Coronary artery calcifications. ?Prominence of the central pulmonary artery suggesting pulmonary ?arterial hypertension. ?  ?07/03/2021 Initial Diagnosis  ? Cancer of transverse colon ([place]) ?  ? ? ?CURRENT THERAPY: observation ? ?INTERVAL HISTORY: ?Richard Spears 88 y.o. male returns for follow-up of his colon cancer.  He continues on observation.  He has some lower abdominal pain score 4 out of 10.  He says his bowel movements have been normal, however he has lost weight.  His weight in November was 150 pounds, today he is 143 pounds.  He is concerned about this. ? ?His  hemoglobin today is improved from prior it is 10.2.  His iron level is 68 and his ferritin is 203.  He did have CEA drawn that was 6.84.  In January 2023 it was 4. ? ? ?Patient Active Problem List  ? Diagnosis Date Noted  ? Superficial vein thrombosis 07/15/2021  ? Cancer of transverse colon  07/03/2021  ? GI bleeding 06/24/2021  ? Acute blood loss anemia 02/03/2021  ? CKD (chronic kidney disease) stage 3, GFR 30-59 ml/min ([facility]) 12/23/2020  ? Pressure  ulcer of right buttock, stage 2 ([place]) 10/07/2020  ? UTI (urinary tract infection) 09/26/2020  ? Weight loss 09/23/2020  ? Insomnia 09/23/2020  ? Dysuria 09/23/2020  ? Hyponatremia 09/16/2020  ? BPH (benign prostatic hyperplasia) 09/03/2020  ? Generalized osteoarthritis of multiple sites 09/03/2020  ? Slow transit constipation 08/30/2020  ? Closed displaced fracture of distal epiphysis of left femur ([place]) 08/25/2020  ? Macrocytic anemia 08/25/2020  ? Fall 08/25/2020  ? Alcohol use 08/25/2020  ? 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 Hip fracture ([place]) 10/09/2017  ? Essential hypertension 05/12/2017  ? Exertional dyspnea 01/18/2016  ? Venous insufficiency 02/14/2015  ? Cancer of skin of right lower leg 10/31/2014  ? AI (aortic insufficiency) 01/23/2014  ? Longstanding persistent atrial fibrillation: CHA2DS2-VASc Score 3 01/17/2014  ? Hyperlipidemia   ? S/P MVR (mitral valve replacement) 04/08/1996  ? ? ?is allergic to flexeril [cyclobenzaprine]. ? ?MEDICAL HISTORY: ?Past Medical History:  ?Diagnosis Date  ? Anemia   ? leakoppenia  ? BPH (benign prostatic hypertrophy)   ? Bullous pemphigoid   ? [name], March 2011, right forearm squamous cell carcinoma  ? Chronic anticoagulation   ? systemic  ? Colon polyp   ? transverse, 2002  ? History of peptic ulcer   ? remote, 3/95  ? Hx of actinic keratosis   ? Hx of basal cell carcinoma   ? Hx of squamous cell carcinoma of skin   ? Hyperlipidemia   ? Left inguinal hernia   ? Moderate aortic insufficiency 2009  ? audible aortic insufficiency on 1/09 echo  ? PAF (paroxysmal atrial fibrillation) ([place]) 01/17/2014  ? On Warfarin.  ? S/P mitral valve replacement with metallic valve 77/9390  ? INR goal 2.5-3.5, St Jude,   ? 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 [place]ia  ? ? ?SURGICAL HISTORY: ?Past Surgical History:  ?Procedure Laterality Date  ? BIOPSY  06/27/2021  ? Procedure: BIOPSY;  Surgeon: Clarene Essex, MD;  Location: [facility];   Service: Endoscopy;;  ? COLONOSCOPY WITH PROPOFOL N/A 06/27/2021  ? Procedure: COLONOSCOPY WITH PROPOFOL;  Surgeon: Clarene Essex, MD;  Location: [facility];  Service: Endoscopy;  Laterality: N/A;  ? Electrodesiccation and Curettage and Shave Biopsy Right   ? Right medial, anterio tibia: Well differentiated Squamous Cell  ? hip replacements Left   ? 10 years ago  ? MITRAL VALVE REPLACEMENT  03/1996  ? St. Jude mechanical valve  ? ORIF FEMUR FRACTURE Left 08/28/2020  ? Procedure: OPEN REDUCTION INTERNAL FIXATION (ORIF) DISTAL FEMUR FRACTURE;  Surgeon: Rod Can, MD;  Location: [facility];  Service: Orthopedics;  Laterality: Left;  ? PARTIAL COLECTOMY N/A 06/30/2021  ? Procedure: PARTIAL COLECTOMY;  Surgeon: Clovis Riley, MD;  Location: [facility];  Service: General;  Laterality: N/A;  ? TOTAL HIP ARTHROPLASTY Right 10/12/2017  ? Procedure: RIGHT TOTAL HIP ARTHROPLASTY ANTERIOR APPROACH;  Surgeon: Gaynelle Arabian, MD;  [facility];  Service: Orthopedics;  Laterality: Right;  ? TRANSTHORACIC ECHOCARDIOGRAM  12/2018  ? Unable to assess diastolic function because of A. fib. Normal RV function, but moderately elevated RVSP.  Severe biatrial enlargement. S/P St Jude bileaflet mechanical MVR that appears to be functioning normally. Mitral valve regurgitation cannot assess due to mechanical valve shadowing. MV Mean grad: 7.0 mmHg MV Area (PHT): 3.38 cm? (stable for valve).  Mild Ao Sclerosis, Mild-Mod AI  ? TRANSTHORACIC ECHOCARDIOGRAM  08/'17; 10/'18  ?  a) Mild conc LVH. EF 55-60%. No RWMA. Mod AI. Mechanical MV prosthesis functioning properly. LAD dilation.;; b)  EF 55-60%.  Mo AI.  Bileaflet Saint Jude mechanical MV with no paravalvular leak.  Severe LA dilation.  Minimally elevated PAP  ? ? ?SOCIAL HISTORY: ?Social History  ? ?Socioeconomic History  ? Marital status: Married  ?  Spouse name: Not on file  ? Number of children: 2  ? Years of education: Not on file  ? Highest education level: Not on file  ?Occupational  History  ? Occupation: retired  ?Tobacco Use  ? Smoking status: Former  ?  Packs/day: 1.00  ?  Years: 13.00  ?  Pack years: 13.00  ?  Types: Cigarettes  ?  Start date: 70  ?  Quit date: 61  ?  Years since quitt

## 2021-09-11 NOTE — Assessment & Plan Note (Signed)
Richard Spears is a 88 year old man with history of stage I colon cancer. ? ?I am concerned that [name] may have a cancer recurrence due to his lower abdominal pain, increase in CEA level, and weight loss.  We will get a CT abdomen and pelvis to further evaluate. ? ?His anemia has improved and his hemoglobin is now 10.3.  His CT scan is scheduled for March 23.  I will follow-up with Dr. Burr Medico to see when she or one of her colleagues can see him next week. ?

## 2021-09-14 ENCOUNTER — Other Ambulatory Visit: Payer: Self-pay | Admitting: Hematology

## 2021-09-17 ENCOUNTER — Other Ambulatory Visit: Payer: Self-pay

## 2021-09-17 ENCOUNTER — Ambulatory Visit (HOSPITAL_COMMUNITY)
Admission: RE | Admit: 2021-09-17 | Discharge: 2021-09-17 | Disposition: A | Discharge status: Home / Self Care | Payer: Medicare Other | Source: Ambulatory Visit | Attending: Adult Health | Admitting: Adult Health

## 2021-09-17 DIAGNOSIS — N2889 Other specified disorders of kidney and ureter: Secondary | ICD-10-CM | POA: Diagnosis not present

## 2021-09-17 DIAGNOSIS — C184 Malignant neoplasm of transverse colon: Secondary | ICD-10-CM | POA: Insufficient documentation

## 2021-09-17 DIAGNOSIS — I700 Atherosclerosis of aorta: Secondary | ICD-10-CM | POA: Diagnosis not present

## 2021-09-17 DIAGNOSIS — K7689 Other specified diseases of liver: Secondary | ICD-10-CM | POA: Diagnosis not present

## 2021-09-17 DIAGNOSIS — K6389 Other specified diseases of intestine: Secondary | ICD-10-CM | POA: Diagnosis not present

## 2021-09-17 DIAGNOSIS — K4090 Unilateral inguinal hernia, without obstruction or gangrene, not specified as recurrent: Secondary | ICD-10-CM | POA: Diagnosis not present

## 2021-09-17 MED ORDER — IOHEXOL 300 MG/ML  SOLN
100.0000 mL | Freq: Once | INTRAMUSCULAR | Status: AC | PRN
  Administered 2021-09-17: 80 mL via INTRAVENOUS

## 2021-09-17 MED ORDER — SODIUM CHLORIDE (PF) 0.9 % IJ SOLN
INTRAMUSCULAR | Status: AC
  Filled 2021-09-17: qty 50

## 2021-09-22 ENCOUNTER — Other Ambulatory Visit (HOSPITAL_COMMUNITY): Payer: Self-pay

## 2021-09-22 ENCOUNTER — Other Ambulatory Visit: Payer: Self-pay

## 2021-09-22 ENCOUNTER — Inpatient Hospital Stay (HOSPITAL_BASED_OUTPATIENT_CLINIC_OR_DEPARTMENT_OTHER): Payer: Medicare Other | Admitting: Hematology

## 2021-09-22 ENCOUNTER — Encounter: Payer: Self-pay | Admitting: Hematology

## 2021-09-22 VITALS — BP 141/78 | HR 88 | Temp 98.0°F | Resp 18 | Ht 72.0 in | Wt 139.7 lb

## 2021-09-22 DIAGNOSIS — R970 Elevated carcinoembryonic antigen [CEA]: Secondary | ICD-10-CM | POA: Diagnosis not present

## 2021-09-22 DIAGNOSIS — R1030 Lower abdominal pain, unspecified: Secondary | ICD-10-CM | POA: Diagnosis not present

## 2021-09-22 DIAGNOSIS — R634 Abnormal weight loss: Secondary | ICD-10-CM | POA: Diagnosis not present

## 2021-09-22 DIAGNOSIS — C184 Malignant neoplasm of transverse colon: Secondary | ICD-10-CM | POA: Diagnosis not present

## 2021-09-22 DIAGNOSIS — D649 Anemia, unspecified: Secondary | ICD-10-CM | POA: Diagnosis not present

## 2021-09-22 DIAGNOSIS — C772 Secondary and unspecified malignant neoplasm of intra-abdominal lymph nodes: Secondary | ICD-10-CM | POA: Diagnosis not present

## 2021-09-22 MED ORDER — OXYCODONE HCL 5 MG PO TABS
2.5000 mg | ORAL_TABLET | Freq: Three times a day (TID) | ORAL | 0 refills | Status: DC | PRN
  Filled 2021-09-22 – 2021-10-07 (×2): qty 20, 7d supply, fill #0

## 2021-09-22 NOTE — Progress Notes (Signed)
?[place]   ?Telephone:([phone] [phone]   ?Clinic Follow up Note  ? ?Patient Care Team: ?[name], MD as PCP - General (Internal Medicine) ?Leonie Man, MD as PCP - Cardiology (Cardiology) ? ?Date of Service:  09/22/2021 ? ?CHIEF COMPLAINT: discuss CT scan results  ? ?CURRENT THERAPY:  ?Surveillance ? ?ASSESSMENT & PLAN:  ?Richard Spears is a 88 y.o. male with  ? ?1. 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 Work up CT showed mass-like filling defect within mid transverse colon. Inpatient colonoscopy on 06/27/21 under Dr. Watt Climes showed a large partially-obstructing transverse colon mass. Pathology confirmed poorly differentiated adenocarcinoma, MMR abnormal with loss of expression in MLH1 and PMS2, MLH1 hypermethylation positive, this is likely sporadic colon cancer.. ?-partial colectomy on 06/30/21 under Dr. Windle Guard showed 5 cm poorly differentiated adenocarcinoma invading muscularis propria. All nodes negative  ?-He has developed upper abdominal pain, and weight loss lately. ?-I reviewed his CT abdomen pelvis scan findings, which unfortunately showed bulky upper abdominal adenopathy, highly suspicious for metastatic cancer from colon.  CT scan also showed nodular enhancement adjacent to the mid transverse colon anastomosis, suspicious for local recurrence.  He has no signs of bowel obstruction. ?-I discussed biopsy through EUS to confirm recurrence.  Due to his advanced age, if he does not want procedure, I will order a PET scan.  ?-I will present his case in GI tumor conference, I feel this is not resectable recurrence, but will get a surgical opinion.  ?-Due to the MMR deficient disease, he is a candidate for immunotherapy Keytruda.  I reviewed the benefit and potential side effects in detail with patient and his wife, and I encouraged him to  consider. ?-Due to his advanced age, he is not a candidate for cytotoxic chemotherapy, he is not interested in chemo either. ?-Due to his advanced age, limited social support, palliative care alone with hospice is also very reasonable.  I discussed home hospice service with patient and his wife in detail, including residential hospice down the road.  They have 1 son who lives out state,and they would like to discuss the plan with him.  I offered a phone call to call his son during the visit, but they rather me call him tomorrow after they speak with him first. ?-After a lengthy discussion, patient and his wife could not make a decision about what to do next during the visit, and would like to think about it, and discussed with their son.  I encouraged him to call me back when decision is made.  ? ? ? ?PLAN: ?-I reviewed his CT scan findings, which is highly spacious for nodal metastasis from his recent colon cancer. ?-Patient will call me with his decision in the next few weeks ?-I we will call his son tomorrow per his request.  ? ? ?No problem-specific Assessment & Plan notes found for this encounter. ? ? ?SUMMARY OF ONCOLOGIC HISTORY: ?Oncology History Overview Note  ? Cancer Staging  ?Cancer of transverse colon  ?Staging form: Colon and Rectum, AJCC 8th Edition ?- Pathologic stage from 06/30/2021: Stage I (pT2, pN0, cM0) - Signed by [name], MD on 07/03/2021 ? ?  ?Cancer of transverse colon   ?06/24/2021 Imaging  ? EXAM: ?CTA ABDOMEN AND PELVIS WITHOUT AND WITH CONTRAST ? ?MPRESSION: ?1. Avid arterial phase enhancing masslike filling defect within the ?mid  transverse colon is identified. Although conceivably this could ?represent a large blood clot the diagnosis of exclusion is a primary ?colonic neoplasm. Further evaluation with colonoscopy is ?recommended. ?2. Extensive atherosclerotic disease noted with stenosis noted at ?the origin of the celiac artery, SMA, IMA and both renal arteries. ?3. Nonocclusive  dissection noted within the right external iliac ?artery. ?4. Large left inguinal hernia contains a nonobstructed loop of large ?bowel. ?5. Aortic Atherosclerosis (ICD10-I70.0). ?  ?06/27/2021 Procedure  ? Colonoscopy, Dr. Watt Climes ? ?Impression: ?- Likely malignant partially obstructing tumor in the mid transverse colon. Biopsied. ?- The examination was otherwise normal on direct and retroflexion views. Unable to advance the scope past the tumor ?  ?06/27/2021 Initial Biopsy  ? FINAL MICROSCOPIC DIAGNOSIS:  ? ?A. TRANSVERSE COLON, BIOPSY:  ?- Poorly differentiated adenocarcinoma, see comment  ? ? ?ADDENDUM:  ?Mismatch Repair Protein (IHC)  ?SUMMARY INTERPRETATION: ABNORMAL  ? ?IHC EXPRESSION RESULTS  ? ?TEST           RESULT  ?MLH1:          LOSS OF NUCLEAR EXPRESSION  ?MSH2:          Preserved nuclear expression  ?MSH6:          Preserved nuclear expression  ?PMS2:          LOSS OF NUCLEAR EXPRESSION  ?  ?06/30/2021 Cancer Staging  ? Staging form: Colon and Rectum, AJCC 8th Edition ?- Pathologic stage from 06/30/2021: Stage I (pT2, pN0, cM0) - Signed by [name], MD on 07/03/2021 ?Stage prefix: Initial diagnosis ?Total positive nodes: 0 ?Residual tumor (R): R0 - None ? ?  ?06/30/2021 Definitive Surgery  ? FINAL MICROSCOPIC DIAGNOSIS:  ? ?A. COLON, TRANSVERSE, PARTIAL COLECTOMY:  ?Poorly differentiated adenocarcinoma invading muscularis propria.  ?See oncology table for details.  ?  ?07/02/2021 Imaging  ? EXAM: ?CT CHEST WITHOUT CONTRAST ? ?IMPRESSION: ?1. No evidence of metastatic disease in the thorax. ?2. Trace bilateral pleural effusions and associated atelectasis. ?3. Multi chamber cardiomegaly. Coronary artery calcifications. ?Prominence of the central pulmonary artery suggesting pulmonary ?arterial hypertension. ?  ?07/03/2021 Initial Diagnosis  ? Cancer of transverse colon ([place]) ?  ? ? ? ?INTERVAL HISTORY:  ?[name] is here for a follow up of colon cancer. He was last seen by NP Mendel Ryder on September 09, 2021.  A  CT scan was ordered on last visit, he is here to discuss the scan findings.  He presents to the clinic with his wife.  He reports intermittent upper and mid abdominal pain, it is worse when he stands and walks around, resolves after he sits down or lays down, no pains at night, she sleeps well.  He also reports mild dyspnea on exertion, he is able to do his routine activities, including housework.  Delivery in the independent living in [place].  He has intermittent constipation, but sometimes has loose bowel movement, no hematochezia.  He has lost about 10 pounds in the past 2 months. ? ?All other systems were reviewed with the patient and are negative. ? ?MEDICAL HISTORY:  ?Past Medical History:  ?Diagnosis Date  ? Anemia   ? leakoppenia  ? BPH (benign prostatic hypertrophy)   ? Bullous pemphigoid   ? [name], March 2011, right forearm squamous cell carcinoma  ? Chronic anticoagulation   ? systemic  ? Colon polyp   ? transverse, 2002  ? History of peptic ulcer   ? remote, 3/95  ? Hx of actinic keratosis   ?  Hx of basal cell carcinoma   ? Hx of squamous cell carcinoma of skin   ? Hyperlipidemia   ? Left inguinal hernia   ? Moderate aortic insufficiency 2009  ? audible aortic insufficiency on 1/09 echo  ? PAF (paroxysmal atrial fibrillation) ([place]) 01/17/2014  ? On Warfarin.  ? S/P mitral valve replacement with metallic valve 54/4920  ? INR goal 2.5-3.5, St Jude,   ? 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 Tibia  ? ? ?SURGICAL HISTORY: ?Past Surgical History:  ?Procedure Laterality Date  ? BIOPSY  06/27/2021  ? Procedure: BIOPSY;  Surgeon: Clarene Essex, MD;  Location: [place];  Service: Endoscopy;;  ? COLONOSCOPY WITH PROPOFOL N/A 06/27/2021  ? Procedure: COLONOSCOPY WITH PROPOFOL;  Surgeon: Clarene Essex, MD;  Location: [place];  Service: Endoscopy;  Laterality: N/A;  ? Electrodesiccation and Curettage and Shave Biopsy Right   ? Right medial, anterio tibia: Well  differentiated Squamous Cell  ? hip replacements Left   ? 10 years ago  ? MITRAL VALVE REPLACEMENT  03/1996  ? St. Jude mechanical valve  ? ORIF FEMUR FRACTURE Left 08/28/2020  ? Procedure: OPEN REDUCTION INTERNAL FIX

## 2021-09-23 ENCOUNTER — Telehealth: Payer: Self-pay | Admitting: Hematology

## 2021-09-23 NOTE — Telephone Encounter (Signed)
Per pt's request yesterday, I called his son Richard Spears and spoke with him and his wife. I discussed his diagnosis, recent CT scan findings, biopsy vs PET, and treatment options of Keytruda including benefit and AEs. We also reviewed hospice option. All questions were answered, he will discuss with his father again to help him to make the final decision. I spent 20 mins on the phone with them, he appreciated my call.  ? ?[name]  ?09/23/2021  ?

## 2021-09-24 ENCOUNTER — Encounter: Payer: Self-pay | Admitting: Hematology

## 2021-09-24 ENCOUNTER — Telehealth: Payer: Self-pay

## 2021-09-24 NOTE — Addendum Note (Signed)
Addended by: [name] on: 09/24/2021 10:33 AM ? ? Modules accepted: Orders ? ?

## 2021-09-24 NOTE — Progress Notes (Signed)
START ON PATHWAY REGIMEN - Colorectal ? ? ?  A cycle is every 21 days: ?    Pembrolizumab  ? ?**Always confirm dose/schedule in your pharmacy ordering system** ? ?Patient Characteristics: ?Distant Metastases, Nonsurgical Candidate, KRAS/NRAS Mutation Positive/Unknown (BRAF V600 Wild-Type/Unknown), Immunotherapy, MSI-H/dMMR ?Tumor Location: Colon ?Therapeutic Status: Distant Metastases ?Microsatellite/Mismatch Repair Status: MSI-H/dMMR ?BRAF Mutation Status: Awaiting Test Results ?KRAS/NRAS Mutation Status: Awaiting Test Results ?Intent of Therapy: ?Non-Curative / Palliative Intent, Discussed with Patient ?

## 2021-09-24 NOTE — Telephone Encounter (Signed)
Pt called stating that he has spoke with his family and has decided to do the Pembrolizumab [facility]) every 21 days.  Notified Dr. Burr Medico of pt's decision and Dr. Burr Medico entered Tx plan for Pembrolizumab.  Sent staff message to [employer] and Toniann Ket for financial clearance for [facility].  Once financial clearance is achieved, will send scheduling message for chemo/immunotherapy education class, lab, f/u Dr. Burr Medico, and infusion. ?

## 2021-09-30 ENCOUNTER — Other Ambulatory Visit: Payer: Self-pay

## 2021-09-30 ENCOUNTER — Inpatient Hospital Stay: Payer: Medicare Other | Attending: Hematology

## 2021-09-30 DIAGNOSIS — Z5112 Encounter for antineoplastic immunotherapy: Secondary | ICD-10-CM | POA: Insufficient documentation

## 2021-09-30 DIAGNOSIS — Z79899 Other long term (current) drug therapy: Secondary | ICD-10-CM | POA: Insufficient documentation

## 2021-09-30 DIAGNOSIS — C184 Malignant neoplasm of transverse colon: Secondary | ICD-10-CM | POA: Insufficient documentation

## 2021-09-30 LAB — SURGICAL PATHOLOGY

## 2021-09-30 NOTE — Progress Notes (Signed)
The proposed treatment discussed in conference is for discussion purpose only and is not a binding recommendation.  The patients have not been physically examined, or presented with their treatment options.  Therefore, final treatment plans cannot be decided.  

## 2021-09-30 NOTE — Progress Notes (Signed)
Pharmacist Chemotherapy Monitoring - Initial Assessment   ? ?Anticipated start date: 10/07/21  ? ?The following has been reviewed per standard work regarding the patient's treatment regimen: ?The patient's diagnosis, treatment plan and drug doses, and organ/hematologic function ?Lab orders and baseline tests specific to treatment regimen  ?The treatment plan start date, drug sequencing, and pre-medications ?Prior authorization status  ?Patient's documented medication list, including drug-drug interaction screen and prescriptions for anti-emetics and supportive care specific to the treatment regimen ?The drug concentrations, fluid compatibility, administration routes, and timing of the medications to be used ?The patient's access for treatment and lifetime cumulative dose history, if applicable  ?The patient's medication allergies and previous infusion related reactions, if applicable  ? ?Changes made to treatment plan:  ?N/A ? ?Follow up needed:  ?N/A ? ? ?Larene Beach, RPH, ?09/30/2021  3:00 PM ? ?

## 2021-10-01 ENCOUNTER — Other Ambulatory Visit (HOSPITAL_COMMUNITY): Payer: Self-pay

## 2021-10-07 ENCOUNTER — Inpatient Hospital Stay: Payer: Medicare Other

## 2021-10-07 ENCOUNTER — Encounter: Payer: Self-pay | Admitting: Hematology

## 2021-10-07 ENCOUNTER — Inpatient Hospital Stay (HOSPITAL_BASED_OUTPATIENT_CLINIC_OR_DEPARTMENT_OTHER): Payer: Medicare Other | Admitting: Hematology

## 2021-10-07 ENCOUNTER — Other Ambulatory Visit: Payer: Self-pay

## 2021-10-07 ENCOUNTER — Other Ambulatory Visit (HOSPITAL_COMMUNITY): Payer: Self-pay

## 2021-10-07 VITALS — BP 120/73 | HR 84 | Temp 98.5°F | Resp 18 | Ht 72.0 in | Wt 139.9 lb

## 2021-10-07 DIAGNOSIS — Z79899 Other long term (current) drug therapy: Secondary | ICD-10-CM | POA: Diagnosis not present

## 2021-10-07 DIAGNOSIS — C184 Malignant neoplasm of transverse colon: Secondary | ICD-10-CM

## 2021-10-07 DIAGNOSIS — D500 Iron deficiency anemia secondary to blood loss (chronic): Secondary | ICD-10-CM

## 2021-10-07 DIAGNOSIS — Z5112 Encounter for antineoplastic immunotherapy: Secondary | ICD-10-CM | POA: Diagnosis not present

## 2021-10-07 LAB — CMP (CANCER CENTER ONLY)
ALT: 15 U/L (ref 0–44)
AST: 32 U/L (ref 15–41)
Albumin: 3.8 g/dL (ref 3.5–5.0)
Alkaline Phosphatase: 58 U/L (ref 38–126)
Anion gap: 11 (ref 5–15)
BUN: 37 mg/dL — ABNORMAL HIGH (ref 8–23)
CO2: 25 mmol/L (ref 22–32)
Calcium: 9.4 mg/dL (ref 8.9–10.3)
Chloride: 97 mmol/L — ABNORMAL LOW (ref 98–111)
Creatinine: 1.61 mg/dL — ABNORMAL HIGH (ref 0.61–1.24)
GFR, Estimated: 39 mL/min — ABNORMAL LOW (ref >60)
Glucose, Bld: 142 mg/dL — ABNORMAL HIGH (ref 70–99)
Potassium: 4.3 mmol/L (ref 3.5–5.1)
Sodium: 133 mmol/L — ABNORMAL LOW (ref 135–145)
Total Bilirubin: 1.2 mg/dL (ref 0.3–1.2)
Total Protein: 7.3 g/dL (ref 6.5–8.1)

## 2021-10-07 LAB — CBC WITH DIFFERENTIAL (CANCER CENTER ONLY)
Abs Immature Granulocytes: 0.01 10*3/uL (ref 0.00–0.07)
Basophils Absolute: 0 10*3/uL (ref 0.0–0.1)
Basophils Relative: 1 %
Eosinophils Absolute: 0 10*3/uL (ref 0.0–0.5)
Eosinophils Relative: 1 %
HCT: 33.1 % — ABNORMAL LOW (ref 39.0–52.0)
Hemoglobin: 11 g/dL — ABNORMAL LOW (ref 13.0–17.0)
Immature Granulocytes: 0 %
Lymphocytes Relative: 13 %
Lymphs Abs: 0.4 10*3/uL — ABNORMAL LOW (ref 0.7–4.0)
MCH: 31.4 pg (ref 26.0–34.0)
MCHC: 33.2 g/dL (ref 30.0–36.0)
MCV: 94.6 fL (ref 80.0–100.0)
Monocytes Absolute: 0.3 10*3/uL (ref 0.1–1.0)
Monocytes Relative: 10 %
Neutro Abs: 2.6 10*3/uL (ref 1.7–7.7)
Neutrophils Relative %: 75 %
Platelet Count: 143 10*3/uL — ABNORMAL LOW (ref 150–400)
RBC: 3.5 MIL/uL — ABNORMAL LOW (ref 4.22–5.81)
RDW: 15.6 % — ABNORMAL HIGH (ref 11.5–15.5)
WBC Count: 3.4 10*3/uL — ABNORMAL LOW (ref 4.0–10.5)
nRBC: 0 % (ref 0.0–0.2)

## 2021-10-07 MED ORDER — SODIUM CHLORIDE 0.9 % IV SOLN: Freq: Once | INTRAVENOUS | Status: AC

## 2021-10-07 MED ORDER — SODIUM CHLORIDE 0.9 % IV SOLN
200.0000 mg | Freq: Once | INTRAVENOUS | Status: AC
  Administered 2021-10-07: 200 mg via INTRAVENOUS
  Filled 2021-10-07: qty 8

## 2021-10-07 NOTE — Progress Notes (Signed)
?[place]   ?Telephone:(336) [phone] [phone]   ?Clinic Follow up Note  ? ?Patient Care Team: ?[name], MD as PCP - General (Internal Medicine) ?Leonie Man, MD as PCP - Cardiology (Cardiology) ? ?Date of Service:  10/07/2021 ? ?CHIEF COMPLAINT: f/u of colon cancer ? ?CURRENT THERAPY:  ?Ballard Russell, starting 10/07/21 ? ?ASSESSMENT & PLAN:  ?Richard Spears is a 88 y.o. male with  ? ?1. Cancer of transverse colon, stage IIIa pT2N1cM0, loss of MLH1 and PMS2, nodal recurrence in 08/2021  ?-presented to ED on 06/24/21 with hematochezia and rectal bleeding. Work up CT showed mass-like filling defect within mid transverse colon. Inpatient colonoscopy on 06/27/21 under Dr. Watt Climes showed a large partially-obstructing transverse colon mass. Pathology confirmed poorly differentiated adenocarcinoma, MMR abnormal with loss of expression in MLH1 and PMS2, MLH1 hypermethylation positive, this is likely sporadic colon cancer. ?-partial colectomy on 06/30/21 under Dr. Windle Guard showed 5 cm poorly differentiated adenocarcinoma invading muscularis propria. He had one tumor deposit  ?-baseline CEA on 09/09/21 slightly elevated to 6.84. ?-CT AP on 09/17/21 showed: bulky upper abdominal adenopathy, highly suspicious for metastatic cancer from colon; nodular enhancement adjacent to the mid transverse colon anastomosis, suspicious for local recurrence.  He has no signs of bowel obstruction. ?-We reviewed his case in our GI conference, this is not resectable disease. ?-he is scheduled for PET scan on 10/16/21. ?-after further discussion with his family, he has agreed to try immunotherapy Keytruda. He is scheduled for first treatment today (10/07/21). I briefly reviewed the benefit and potential side effects with him again today. Labs reviewed, overall stable and adequate to proceed with first [place]. ?  ?2. Social ?-he and his wife live in an independent living neighborhood. ?-he does not have a car  and relies on [facility] transportation services. ?-his son lives out of state but is involved in his care. ?  ?3. Goal of care discussion, DNR ?-We again discussed the incurable nature of his cancer, and the overall poor prognosis, especially if he does not have good response to chemotherapy or progress on chemo ?-The patient understands the goal of care is palliative. ?-I recommend DNR/DNI, he verbally agrees today (10/07/21) ? ?  ?  ?PLAN: ?-proceed with first [place] today ?-lab, f/u, and Keytruda every 3 weeks ?-I called [facility] and asked them to mail his oxycodone ? ? ?No problem-specific Assessment & Plan notes found for this encounter. ? ? ?SUMMARY OF ONCOLOGIC HISTORY: ?Oncology History Overview Note  ? Cancer Staging  ?Cancer of transverse colon  ?Staging form: Colon and Rectum, AJCC 8th Edition ?- Pathologic stage from 06/30/2021: Stage I (pT2, pN0, cM0) - Signed by [name], MD on 07/03/2021 ? ?  ?Cancer of transverse colon   ?06/24/2021 Imaging  ? EXAM: ?CTA ABDOMEN AND PELVIS WITHOUT AND WITH CONTRAST ? ?MPRESSION: ?1. Avid arterial phase enhancing masslike filling defect within the ?mid transverse colon is identified. Although conceivably this could ?represent a large blood clot the diagnosis of exclusion is a primary ?colonic neoplasm. Further evaluation with colonoscopy is ?recommended. ?2. Extensive atherosclerotic disease noted with stenosis noted at ?the origin of the celiac artery, SMA, IMA and both renal arteries. ?3. 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 ?4. Large left inguinal hernia contains a nonobstructed loop of large ?bowel. ?5. Aortic Atherosclerosis (ICD10-I70.0). ?  ?06/27/2021 Procedure  ? Colonoscopy, Dr. Watt Climes ? ?Impression: ?- Likely malignant partially obstructing tumor in the mid transverse colon. Biopsied. ?-  The examination was otherwise normal on direct and retroflexion views. Unable to advance the scope past the tumor ?   ?06/27/2021 Initial Biopsy  ? FINAL MICROSCOPIC DIAGNOSIS:  ? ?A. TRANSVERSE COLON, BIOPSY:  ?- Poorly differentiated adenocarcinoma, see comment  ? ? ?ADDENDUM:  ?Mismatch Repair Protein (IHC)  ?SUMMARY INTERPRETATION: ABNORMAL  ? ?IHC EXPRESSION RESULTS  ? ?TEST           RESULT  ?MLH1:          LOSS OF NUCLEAR EXPRESSION  ?MSH2:          Preserved nuclear expression  ?MSH6:          Preserved nuclear expression  ?PMS2:          LOSS OF NUCLEAR EXPRESSION  ?  ?06/30/2021 Cancer Staging  ? Staging form: Colon and Rectum, AJCC 8th Edition ?- Pathologic stage from 06/30/2021: Stage I (pT2, pN0, cM0) - Signed by [name], MD on 07/03/2021 ?Stage prefix: Initial diagnosis ?Total positive nodes: 0 ?Residual tumor (R): R0 - None ? ?  ?06/30/2021 Definitive Surgery  ? FINAL MICROSCOPIC DIAGNOSIS:  ? ?A. COLON, TRANSVERSE, PARTIAL COLECTOMY:  ?Poorly differentiated adenocarcinoma invading muscularis propria.  ?See oncology table for details.  ?  ?07/02/2021 Imaging  ? EXAM: ?CT CHEST WITHOUT CONTRAST ? ?IMPRESSION: ?1. No evidence of metastatic disease in the thorax. ?2. Trace bilateral pleural effusions and associated atelectasis. ?3. Multi chamber cardiomegaly. Coronary artery calcifications. ?Prominence of the central pulmonary artery suggesting pulmonary ?arterial hypertension. ?  ?07/03/2021 Initial Diagnosis  ? Cancer of transverse colon [facility]) ?  ?10/07/2021 -  Chemotherapy  ? Patient is on Treatment Plan : COLORECTAL Pembrolizumab (200) q21d  ?   ? ? ? ?INTERVAL HISTORY:  ?Richard Spears is here for a follow up of colon cancer. He was last seen by me on 09/22/21. He presents to the clinic alone. ?He reports he is doing okay overall, no new concerns. ?  ?All other systems were reviewed with the patient and are negative. ? ?MEDICAL HISTORY:  ?Past Medical History:  ?Diagnosis Date  ? Anemia   ? leakoppenia  ? BPH (benign prostatic hypertrophy)   ? Bullous pemphigoid   ? [name], March 2011, right forearm squamous cell  carcinoma  ? Chronic anticoagulation   ? systemic  ? Colon polyp   ? transverse, 2002  ? History of peptic ulcer   ? remote, 3/95  ? Hx of actinic keratosis   ? Hx of basal cell carcinoma   ? Hx of squamous cell carcinoma of skin   ? Hyperlipidemia   ? Left inguinal hernia   ? Moderate aortic insufficiency 2009  ? audible aortic insufficiency on 1/09 echo  ? PAF (paroxysmal atrial fibrillation) ([place]) 01/17/2014  ? On Warfarin.  ? S/P mitral valve replacement with metallic valve 62/9528  ? INR goal 2.5-3.5, St Jude,   ? 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 Tibia  ? ? ?SURGICAL HISTORY: ?Past Surgical History:  ?Procedure Laterality Date  ? BIOPSY  06/27/2021  ? Procedure: BIOPSY;  Surgeon: Clarene Essex, MD;  Location: [place];  Service: Endoscopy;;  ? COLONOSCOPY WITH PROPOFOL N/A 06/27/2021  ? Procedure: COLONOSCOPY WITH PROPOFOL;  Surgeon: Clarene Essex, MD;  Location: [place];  Service: Endoscopy;  Laterality: N/A;  ? Electrodesiccation and Curettage and Shave Biopsy Right   ? Right medial, anterio tibia: Well differentiated Squamous Cell  ? hip replacements Left   ?  10 years ago  ? MITRAL VALVE REPLACEMENT  03/1996  ? St. Jude mechanical valve  ? ORIF FEMUR FRACTURE Left 08/28/2020  ? Procedure: OPEN REDUCTION INTERNAL FIXATION (ORIF) DISTAL FEMUR FRACTURE;  Surgeon: Rod Can, MD;  Location: [place];  Service: Orthopedics;  Laterality: Left;  ? PARTIAL COLECTOMY N/A 06/30/2021  ? Procedure: PARTIAL COLECTOMY;  Surgeon: Clovis Riley, MD;  Location: [place];  Service: General;  Laterality: N/A;  ? TOTAL HIP ARTHROPLASTY Right 10/12/2017  ? Procedure: RIGHT TOTAL HIP ARTHROPLASTY ANTERIOR APPROACH;  Surgeon: Gaynelle Arabian, MD;  [facility]: [facility];  Service: Orthopedics;  Laterality: Right;  ? TRANSTHORACIC ECHOCARDIOGRAM  12/2018  ? Unable to assess diastolic function because of A. fib. Normal RV function, but moderately elevated RVSP.  Severe biatrial enlargement. S/P St  Jude bileaflet mechanical MVR that appears to be functioning normally. Mitral valve regurgitation cannot assess due to mechanical valve shadowing. MV Mean grad: 7.0 mmHg MV Area (PHT): 3.38 cm? (stable for valve

## 2021-10-07 NOTE — Patient Instructions (Signed)
[facility]   ?Discharge Instructions: ?Thank you for choosing [facility] to provide your oncology and hematology care.  ? ?If you have a lab appointment with the Cancer Center, please go directly to the Cancer Center and check in at the registration area. ?  ?Wear comfortable clothing and clothing appropriate for easy access to any Portacath or PICC line.  ? ?We strive to give you quality time with your provider. You may need to reschedule your appointment if you arrive late (15 or more minutes).  Arriving late affects you and other patients whose appointments are after yours.  Also, if you miss three or more appointments without notifying the office, you may be dismissed from the clinic at the provider?s discretion.    ?  ?For prescription refill requests, have your pharmacy contact our office and allow 72 hours for refills to be completed.   ? ?Today you received the following chemotherapy and/or immunotherapy agents: pembrolizumab    ?  ?To help prevent nausea and vomiting after your treatment, we encourage you to take your nausea medication as directed. ? ?BELOW ARE SYMPTOMS THAT SHOULD BE REPORTED IMMEDIATELY: ?*FEVER GREATER THAN 100.4 F (38 ?C) OR HIGHER ?*CHILLS OR SWEATING ?*NAUSEA AND VOMITING THAT IS NOT CONTROLLED WITH YOUR NAUSEA MEDICATION ?*UNUSUAL SHORTNESS OF BREATH ?*UNUSUAL BRUISING OR BLEEDING ?*URINARY PROBLEMS (pain or burning when urinating, or frequent urination) ?*BOWEL PROBLEMS (unusual diarrhea, constipation, pain near the anus) ?TENDERNESS IN MOUTH AND THROAT WITH OR WITHOUT PRESENCE OF ULCERS (sore throat, sores in mouth, or a toothache) ?UNUSUAL RASH, SWELLING OR PAIN  ?UNUSUAL VAGINAL DISCHARGE OR ITCHING  ? ?Items with * indicate a potential emergency and should be followed up as soon as possible or go to the Emergency Department if any problems should occur. ? ?Please show the CHEMOTHERAPY ALERT CARD or IMMUNOTHERAPY ALERT CARD at  check-in to the Emergency Department and triage nurse. ? ?Should you have questions after your visit or need to cancel or reschedule your appointment, please contact [facility]  Dept: [phone]  and follow the prompts.  Office hours are 8:00 a.m. to 4:30 p.m. Monday - Friday. Please note that voicemails left after 4:00 p.m. may not be returned until the following business day.  We are closed weekends and major holidays. You have access to a nurse at all times for urgent questions. Please call the main number to the clinic Dept: [phone] and follow the prompts. ? ? ?For any non-urgent questions, you may also contact your provider using MyChart. We now offer e-Visits for anyone 18 and older to request care online for non-urgent symptoms. For details visit [email or web address]. ?  ?Also download the MyChart app! Go to the app store, search "MyChart", open the app, select [place], and log in with your MyChart username and password. ? ?Due to Covid, a mask is required upon entering the hospital/clinic. If you do not have a mask, one will be given to you upon arrival. For doctor visits, patients may have 1 support person aged 18 or older with them. For treatment visits, patients cannot have anyone with them due to current Covid guidelines and our immunocompromised population.  ? ?

## 2021-10-07 NOTE — Progress Notes (Signed)
Ok to treat with Scr 1.61 today per Dr Burr Medico ?

## 2021-10-08 ENCOUNTER — Telehealth: Payer: Self-pay | Admitting: Hematology

## 2021-10-08 ENCOUNTER — Other Ambulatory Visit: Payer: Self-pay

## 2021-10-08 ENCOUNTER — Other Ambulatory Visit (HOSPITAL_COMMUNITY): Payer: Self-pay

## 2021-10-08 LAB — FERRITIN: Ferritin: 198 ng/mL (ref 24–336)

## 2021-10-08 LAB — T4: T4, Total: 5.3 ug/dL (ref 4.5–12.0)

## 2021-10-08 LAB — CEA (IN HOUSE-CHCC): CEA (CHCC-In House): 6.93 ng/mL — ABNORMAL HIGH (ref 0.00–5.00)

## 2021-10-08 LAB — TSH: TSH: 8.784 u[IU]/mL — ABNORMAL HIGH (ref 0.320–4.118)

## 2021-10-08 NOTE — Telephone Encounter (Signed)
Scheduled follow-up appointments per 4/12 los. Patient is aware. 

## 2021-10-16 ENCOUNTER — Ambulatory Visit (HOSPITAL_COMMUNITY)
Admission: RE | Admit: 2021-10-16 | Discharge: 2021-10-16 | Disposition: A | Discharge status: Home / Self Care | Payer: Medicare Other | Source: Ambulatory Visit | Attending: Hematology | Admitting: Hematology

## 2021-10-16 DIAGNOSIS — C184 Malignant neoplasm of transverse colon: Secondary | ICD-10-CM | POA: Insufficient documentation

## 2021-10-16 DIAGNOSIS — C189 Malignant neoplasm of colon, unspecified: Secondary | ICD-10-CM | POA: Diagnosis not present

## 2021-10-16 LAB — GLUCOSE, CAPILLARY: Glucose-Capillary: 89 mg/dL (ref 70–99)

## 2021-10-16 MED ORDER — FLUDEOXYGLUCOSE F - 18 (FDG) INJECTION
6.9000 | Freq: Once | INTRAVENOUS | Status: AC
  Administered 2021-10-16: 6.95 via INTRAVENOUS

## 2021-10-19 ENCOUNTER — Ambulatory Visit (INDEPENDENT_AMBULATORY_CARE_PROVIDER_SITE_OTHER): Payer: Medicare Other

## 2021-10-19 DIAGNOSIS — Z952 Presence of prosthetic heart valve: Secondary | ICD-10-CM | POA: Diagnosis not present

## 2021-10-19 DIAGNOSIS — Z5181 Encounter for therapeutic drug level monitoring: Secondary | ICD-10-CM | POA: Diagnosis not present

## 2021-10-19 DIAGNOSIS — I480 Paroxysmal atrial fibrillation: Secondary | ICD-10-CM

## 2021-10-19 DIAGNOSIS — I059 Rheumatic mitral valve disease, unspecified: Secondary | ICD-10-CM

## 2021-10-19 LAB — POCT INR: INR: 4.3 — AB (ref 2.0–3.0)

## 2021-10-19 NOTE — Patient Instructions (Signed)
HOLD Tuesday DOSE and then continue 1 tablet daily except 0.5 tablet every Monday, Wednesday and Friday.  ?-Recheck INR in 2 weeks. Coumadin Clinic [phone] ?-[facility] [phone] ?

## 2021-10-21 ENCOUNTER — Telehealth: Payer: Self-pay

## 2021-10-21 NOTE — Telephone Encounter (Signed)
-----   Message from [name], MD sent at 10/20/2021 10:44 PM EDT ----- ?Please let pt know his PET scan findings which showed known lymph nodes metastasis, no other new lesions, thanks  ? ?[name]  ?10/20/2021  ?

## 2021-10-21 NOTE — Telephone Encounter (Signed)
This nurse spoke with patient and made him aware of the results of his PET scan per providers notes below.  Patient acknowledged understanding.  No further questions or concerns at this time.  This nurse reminded patient of next appointments scheduled.  Patient acknowledged and knows to call clinic if he has any questions or concerns.   ?

## 2021-10-26 ENCOUNTER — Telehealth: Payer: Self-pay

## 2021-10-26 ENCOUNTER — Other Ambulatory Visit (HOSPITAL_COMMUNITY): Payer: Self-pay

## 2021-10-26 ENCOUNTER — Encounter: Payer: Self-pay | Admitting: Hematology

## 2021-10-26 ENCOUNTER — Other Ambulatory Visit: Payer: Self-pay | Admitting: Hematology

## 2021-10-26 MED ORDER — OXYCODONE HCL 5 MG PO TABS
2.5000 mg | ORAL_TABLET | Freq: Three times a day (TID) | ORAL | 0 refills | Status: DC | PRN
  Filled 2021-10-26: qty 20, 7d supply, fill #0

## 2021-10-26 NOTE — Telephone Encounter (Signed)
This patient called in stating that for the past week he has been having some loose stools.  He has been going 3-4 times a day.  Thi nurse verified that patient is not using Miralax.  Patient denies feeling dehydrated stated that he is drinking plenty of fluids, however he is not taking anything for the loose stools.  This nurse encouraged patient to take some Imodium that he can get over the counter and take according to the instructions on the box. Advised if it is not effective then the provider can prescribe him something else.  Encouraged patient to continue drinking fluids to prevent dehydration. Patient acknowledged understanding and has no further questions or concerns at this time.    ?

## 2021-10-28 NOTE — Progress Notes (Signed)
?[place]   ?[phone] [phone]   ?Clinic Follow up Note  ? ?Patient Care Team: ?[name], MD as PCP - General (Internal Medicine) ?Leonie Man, MD as PCP - Cardiology (Cardiology) ?10/29/2021 ? ?CHIEF COMPLAINT: Follow up colon cancer  ? ?SUMMARY OF ONCOLOGIC HISTORY: ?Oncology History Overview Note  ? Cancer Staging  ?Cancer of transverse colon  ?Staging form: Colon and Rectum, AJCC 8th Edition ?- Pathologic stage from 06/30/2021: Stage I (pT2, pN0, cM0) - Signed by [name], MD on 07/03/2021 ? ?  ?Cancer of transverse colon   ?06/24/2021 Imaging  ? EXAM: ?CTA ABDOMEN AND PELVIS WITHOUT AND WITH CONTRAST ? ?MPRESSION: ?1. Avid arterial phase enhancing masslike filling defect within the ?mid transverse colon is identified. Although conceivably this could ?represent a large blood clot the diagnosis of exclusion is a primary ?colonic neoplasm. Further evaluation with colonoscopy is ?recommended. ?2. Extensive atherosclerotic disease noted with stenosis noted at ?the origin of the celiac artery, SMA, IMA and both renal arteries. ?3. 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 ?4. Large left inguinal hernia contains a nonobstructed loop of large ?bowel. ?5. Aortic Atherosclerosis (ICD10-I70.0). ?  ?06/27/2021 Procedure  ? Colonoscopy, Dr. Watt Climes ? ?Impression: ?- Likely malignant partially obstructing tumor in the mid transverse colon. Biopsied. ?- The examination was otherwise normal on direct and retroflexion views. Unable to advance the scope past the tumor ?  ?06/27/2021 Initial Biopsy  ? FINAL MICROSCOPIC DIAGNOSIS:  ? ?A. TRANSVERSE COLON, BIOPSY:  ?- Poorly differentiated adenocarcinoma, see comment  ? ? ?ADDENDUM:  ?Mismatch Repair Protein (IHC)  ?SUMMARY INTERPRETATION: ABNORMAL  ? ?IHC EXPRESSION RESULTS  ? ?TEST           RESULT  ?MLH1:          LOSS OF NUCLEAR EXPRESSION  ?MSH2:          Preserved nuclear expression  ?MSH6:           Preserved nuclear expression  ?PMS2:          LOSS OF NUCLEAR EXPRESSION  ?  ?06/30/2021 Cancer Staging  ? Staging form: Colon and Rectum, AJCC 8th Edition ?- Pathologic stage from 06/30/2021: Stage IIIA (pT2, pN1c, cM0) - Signed by [name], MD on 10/07/2021 ?Stage prefix: Initial diagnosis ?Total positive nodes: 0 ?Residual tumor (R): R0 - None ? ?  ?06/30/2021 Definitive Surgery  ? FINAL MICROSCOPIC DIAGNOSIS:  ? ?A. COLON, TRANSVERSE, PARTIAL COLECTOMY:  ?Poorly differentiated adenocarcinoma invading muscularis propria.  ?See oncology table for details.  ?  ?07/02/2021 Imaging  ? EXAM: ?CT CHEST WITHOUT CONTRAST ? ?IMPRESSION: ?1. No evidence of metastatic disease in the thorax. ?2. Trace bilateral pleural effusions and associated atelectasis. ?3. Multi chamber cardiomegaly. Coronary artery calcifications. ?Prominence of the central pulmonary artery suggesting pulmonary ?arterial hypertension. ?  ?07/03/2021 Initial Diagnosis  ? Cancer of transverse colon ([place]) ? ?  ?10/07/2021 -  Chemotherapy  ? Patient is on Treatment Plan : COLORECTAL Pembrolizumab (200) q21d  ? ?  ?  ?10/16/2021 PET scan  ? IMPRESSION: ?1. Intense multifocal hypermetabolic nodal metastases within the gastrohepatic ligament and upper retroperitoneum. This nodal disease appears mildly progressive compared with CT of 4 weeks ago. ?2. No definite hypermetabolic activity at the colonic anastomosis as suggested on previous CT. ?3. No hepatic or other definite distant metastases identified. Low-level hilar activity bilaterally is nonspecific, but likely reactive. ?4. Recent compression deformities at T10 and T11 with mild hypermetabolic activity. ?  ? ? ?  CURRENT THERAPY: Pembrolizumab, q3 weeks starting 10/07/21 ? ?INTERVAL HISTORY: Mr. Sisney returns for follow up and treatment as scheduled. Last seen by Dr. Burr Medico 10/07/21 and began [place]. He had a PET scan 10/16/21.  He presents by himself today.  He notes prior to starting [place] he had constipation  managed with MiraLAX.  Last week he developed intermittent loose stool.  He stopped MiraLAX and started Imodium, took 2 on the first day then as needed.  This helped and loose stools have improved but still not formed.  He does not think this is diet related.  Appetite decreased after infusions but improved recently, he takes a boost every few days.  Abdominal pain is stable, well managed with oxycodone 1 in the morning.  He developed back pain and left shoulder pain but not sure when.  He has had a couple falls not long ago, overall pain is stable.  He can still manage well at home independently.  He has occasional dyspnea that improves with activity, denies cough or chest pain.  Denies fever, chills, nausea/vomiting, or any other new specific complaints. ? ? ?MEDICAL HISTORY:  ?Past Medical History:  ?Diagnosis Date  ? Anemia   ? leakoppenia  ? BPH (benign prostatic hypertrophy)   ? Bullous pemphigoid   ? [name], March 2011, right forearm squamous cell carcinoma  ? Chronic anticoagulation   ? systemic  ? Colon polyp   ? transverse, 2002  ? History of peptic ulcer   ? remote, 3/95  ? Hx of actinic keratosis   ? Hx of basal cell carcinoma   ? Hx of squamous cell carcinoma of skin   ? Hyperlipidemia   ? Left inguinal hernia   ? Moderate aortic insufficiency 2009  ? audible aortic insufficiency on 1/09 echo  ? PAF (paroxysmal atrial fibrillation) ([place]) 01/17/2014  ? On Warfarin.  ? S/P mitral valve replacement with metallic valve 81/8563  ? INR goal 2.5-3.5, St Jude,   ? 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 [place]  ? ? ?SURGICAL HISTORY: ?Past Surgical History:  ?Procedure Laterality Date  ? BIOPSY  06/27/2021  ? Procedure: BIOPSY;  Surgeon: Clarene Essex, MD;  Location: [facility];  Service: Endoscopy;;  ? COLONOSCOPY WITH PROPOFOL N/A 06/27/2021  ? Procedure: COLONOSCOPY WITH PROPOFOL;  Surgeon: Clarene Essex, MD;  Location: [facility];  Service: Endoscopy;  Laterality: N/A;  ?  Electrodesiccation and Curettage and Shave Biopsy Right   ? Right medial, anterio tibia: Well differentiated Squamous Cell  ? hip replacements Left   ? 10 years ago  ? MITRAL VALVE REPLACEMENT  03/1996  ? St. Jude mechanical valve  ? ORIF FEMUR FRACTURE Left 08/28/2020  ? Procedure: OPEN REDUCTION INTERNAL FIXATION (ORIF) DISTAL FEMUR FRACTURE;  Surgeon: Rod Can, MD;  Location: [facility];  Service: Orthopedics;  Laterality: Left;  ? PARTIAL COLECTOMY N/A 06/30/2021  ? Procedure: PARTIAL COLECTOMY;  Surgeon: Clovis Riley, MD;  Location: [facility];  Service: General;  Laterality: N/A;  ? TOTAL HIP ARTHROPLASTY Right 10/12/2017  ? Procedure: RIGHT TOTAL HIP ARTHROPLASTY ANTERIOR APPROACH;  Surgeon: Gaynelle Arabian, MD;  Location: [facility];  Service: Orthopedics;  Laterality: Right;  ? TRANSTHORACIC ECHOCARDIOGRAM  12/2018  ? Unable to assess diastolic function because of A. fib. Normal RV function, but moderately elevated RVSP.  Severe biatrial enlargement. S/P St Jude bileaflet mechanical MVR that appears to be functioning normally. Mitral valve regurgitation cannot assess due to mechanical valve  shadowing. MV Mean grad: 7.0 mmHg MV Area (PHT): 3.38 cm? (stable for valve).  Mild Ao Sclerosis, Mild-Mod AI  ? TRANSTHORACIC ECHOCARDIOGRAM  08/'17; 10/'18  ?  a) Mild conc LVH. EF 55-60%. No RWMA. Mod AI. Mechanical MV prosthesis functioning properly. LAD dilation.;; b)  EF 55-60%.  Mo AI.  Bileaflet Saint Jude mechanical MV with no paravalvular leak.  Severe LA dilation.  Minimally elevated PAP  ? ? ?I have reviewed the social history and family history with the patient and they are unchanged from previous note. ? ?ALLERGIES:  is allergic to flexeril [cyclobenzaprine]. ? ?MEDICATIONS:  ?Current Outpatient Medications  ?Medication Sig Dispense Refill  ? loperamide (IMODIUM) 2 MG capsule Take 1 capsule (2 mg total) by mouth as needed for diarrhea or loose stools. 30 capsule 1  ? acetaminophen (TYLENOL) 500 MG tablet Take 1,000  mg by mouth 3 (three) times daily as needed for mild pain.    ? bisoprolol (ZEBETA) 5 MG tablet Take 1 tablet (5 mg total) by mouth daily. 90 tablet 3  ? ferrous sulfate 325 (65 FE) MG tablet Take 1 tabl

## 2021-10-29 ENCOUNTER — Encounter: Payer: Self-pay | Admitting: Nurse Practitioner

## 2021-10-29 ENCOUNTER — Inpatient Hospital Stay: Payer: Medicare Other

## 2021-10-29 ENCOUNTER — Inpatient Hospital Stay: Payer: Medicare Other | Attending: Hematology

## 2021-10-29 ENCOUNTER — Other Ambulatory Visit: Payer: Self-pay

## 2021-10-29 ENCOUNTER — Other Ambulatory Visit (HOSPITAL_COMMUNITY): Payer: Self-pay

## 2021-10-29 ENCOUNTER — Inpatient Hospital Stay (HOSPITAL_BASED_OUTPATIENT_CLINIC_OR_DEPARTMENT_OTHER): Payer: Medicare Other | Admitting: Nurse Practitioner

## 2021-10-29 VITALS — BP 117/85 | HR 84 | Temp 97.9°F | Resp 17 | Ht 72.0 in | Wt 137.1 lb

## 2021-10-29 DIAGNOSIS — Z79899 Other long term (current) drug therapy: Secondary | ICD-10-CM | POA: Insufficient documentation

## 2021-10-29 DIAGNOSIS — Z5112 Encounter for antineoplastic immunotherapy: Secondary | ICD-10-CM | POA: Diagnosis not present

## 2021-10-29 DIAGNOSIS — C184 Malignant neoplasm of transverse colon: Secondary | ICD-10-CM | POA: Insufficient documentation

## 2021-10-29 DIAGNOSIS — D500 Iron deficiency anemia secondary to blood loss (chronic): Secondary | ICD-10-CM

## 2021-10-29 LAB — CMP (CANCER CENTER ONLY)
ALT: 15 U/L (ref 0–44)
AST: 27 U/L (ref 15–41)
Albumin: 3.7 g/dL (ref 3.5–5.0)
Alkaline Phosphatase: 58 U/L (ref 38–126)
Anion gap: 7 (ref 5–15)
BUN: 40 mg/dL — ABNORMAL HIGH (ref 8–23)
CO2: 27 mmol/L (ref 22–32)
Calcium: 9.3 mg/dL (ref 8.9–10.3)
Chloride: 100 mmol/L (ref 98–111)
Creatinine: 1.79 mg/dL — ABNORMAL HIGH (ref 0.61–1.24)
GFR, Estimated: 35 mL/min — ABNORMAL LOW (ref >60)
Glucose, Bld: 97 mg/dL (ref 70–99)
Potassium: 4.3 mmol/L (ref 3.5–5.1)
Sodium: 134 mmol/L — ABNORMAL LOW (ref 135–145)
Total Bilirubin: 0.8 mg/dL (ref 0.3–1.2)
Total Protein: 7.2 g/dL (ref 6.5–8.1)

## 2021-10-29 LAB — CBC WITH DIFFERENTIAL (CANCER CENTER ONLY)
Abs Immature Granulocytes: 0.03 10*3/uL (ref 0.00–0.07)
Basophils Absolute: 0 10*3/uL (ref 0.0–0.1)
Basophils Relative: 1 %
Eosinophils Absolute: 0.1 10*3/uL (ref 0.0–0.5)
Eosinophils Relative: 3 %
HCT: 29.7 % — ABNORMAL LOW (ref 39.0–52.0)
Hemoglobin: 10.2 g/dL — ABNORMAL LOW (ref 13.0–17.0)
Immature Granulocytes: 1 %
Lymphocytes Relative: 16 %
Lymphs Abs: 0.5 10*3/uL — ABNORMAL LOW (ref 0.7–4.0)
MCH: 32.4 pg (ref 26.0–34.0)
MCHC: 34.3 g/dL (ref 30.0–36.0)
MCV: 94.3 fL (ref 80.0–100.0)
Monocytes Absolute: 0.4 10*3/uL (ref 0.1–1.0)
Monocytes Relative: 13 %
Neutro Abs: 2 10*3/uL (ref 1.7–7.7)
Neutrophils Relative %: 66 %
Platelet Count: 163 10*3/uL (ref 150–400)
RBC: 3.15 MIL/uL — ABNORMAL LOW (ref 4.22–5.81)
RDW: 16.2 % — ABNORMAL HIGH (ref 11.5–15.5)
WBC Count: 2.9 10*3/uL — ABNORMAL LOW (ref 4.0–10.5)
nRBC: 0 % (ref 0.0–0.2)

## 2021-10-29 LAB — TSH: TSH: 14.552 u[IU]/mL — ABNORMAL HIGH (ref 0.350–4.500)

## 2021-10-29 LAB — FERRITIN: Ferritin: 159 ng/mL (ref 24–336)

## 2021-10-29 LAB — CEA (IN HOUSE-CHCC): CEA (CHCC-In House): 7.03 ng/mL — ABNORMAL HIGH (ref 0.00–5.00)

## 2021-10-29 MED ORDER — SODIUM CHLORIDE 0.9 % IV SOLN: Freq: Once | INTRAVENOUS | Status: AC

## 2021-10-29 MED ORDER — LOPERAMIDE HCL 2 MG PO CAPS
2.0000 mg | ORAL_CAPSULE | ORAL | 1 refills | Status: DC | PRN
  Filled 2021-10-29: qty 30, 30d supply, fill #0

## 2021-10-29 MED ORDER — SODIUM CHLORIDE 0.9 % IV SOLN
200.0000 mg | Freq: Once | INTRAVENOUS | Status: AC
  Administered 2021-10-29: 200 mg via INTRAVENOUS
  Filled 2021-10-29: qty 200

## 2021-10-29 NOTE — Patient Instructions (Signed)
[facility]   ?Discharge Instructions: ?Thank you for choosing [facility] to provide your oncology and hematology care.  ? ?If you have a lab appointment with the Cancer Center, please go directly to the Cancer Center and check in at the registration area. ?  ?Wear comfortable clothing and clothing appropriate for easy access to any Portacath or PICC line.  ? ?We strive to give you quality time with your provider. You may need to reschedule your appointment if you arrive late (15 or more minutes).  Arriving late affects you and other patients whose appointments are after yours.  Also, if you miss three or more appointments without notifying the office, you may be dismissed from the clinic at the provider?s discretion.    ?  ?For prescription refill requests, have your pharmacy contact our office and allow 72 hours for refills to be completed.   ? ?Today you received the following chemotherapy and/or immunotherapy agents: pembrolizumab    ?  ?To help prevent nausea and vomiting after your treatment, we encourage you to take your nausea medication as directed. ? ?BELOW ARE SYMPTOMS THAT SHOULD BE REPORTED IMMEDIATELY: ?*FEVER GREATER THAN 100.4 F (38 ?C) OR HIGHER ?*CHILLS OR SWEATING ?*NAUSEA AND VOMITING THAT IS NOT CONTROLLED WITH YOUR NAUSEA MEDICATION ?*UNUSUAL SHORTNESS OF BREATH ?*UNUSUAL BRUISING OR BLEEDING ?*URINARY PROBLEMS (pain or burning when urinating, or frequent urination) ?*BOWEL PROBLEMS (unusual diarrhea, constipation, pain near the anus) ?TENDERNESS IN MOUTH AND THROAT WITH OR WITHOUT PRESENCE OF ULCERS (sore throat, sores in mouth, or a toothache) ?UNUSUAL RASH, SWELLING OR PAIN  ?UNUSUAL VAGINAL DISCHARGE OR ITCHING  ? ?Items with * indicate a potential emergency and should be followed up as soon as possible or go to the Emergency Department if any problems should occur. ? ?Please show the CHEMOTHERAPY ALERT CARD or IMMUNOTHERAPY ALERT CARD at  check-in to the Emergency Department and triage nurse. ? ?Should you have questions after your visit or need to cancel or reschedule your appointment, please contact [facility]  Dept: [phone]  and follow the prompts.  Office hours are 8:00 a.m. to 4:30 p.m. Monday - Friday. Please note that voicemails left after 4:00 p.m. may not be returned until the following business day.  We are closed weekends and major holidays. You have access to a nurse at all times for urgent questions. Please call the main number to the clinic Dept: [phone] and follow the prompts. ? ? ?For any non-urgent questions, you may also contact your provider using MyChart. We now offer e-Visits for anyone 18 and older to request care online for non-urgent symptoms. For details visit [email or web address]. ?  ?Also download the MyChart app! Go to the app store, search "MyChart", open the app, select [facility], and log in with your MyChart username and password. ? ?Due to Covid, a mask is required upon entering the hospital/clinic. If you do not have a mask, one will be given to you upon arrival. For doctor visits, patients may have 1 support person aged 18 or older with them. For treatment visits, patients cannot have anyone with them due to current Covid guidelines and our immunocompromised population.  ? ?

## 2021-10-29 NOTE — Progress Notes (Signed)
Ok to treat today with Scr 1.79 per Regan Rakers B NP. ?

## 2021-10-30 LAB — T4: T4, Total: 4.1 ug/dL — ABNORMAL LOW (ref 4.5–12.0)

## 2021-11-02 ENCOUNTER — Ambulatory Visit (INDEPENDENT_AMBULATORY_CARE_PROVIDER_SITE_OTHER): Payer: Medicare Other

## 2021-11-02 DIAGNOSIS — Z5181 Encounter for therapeutic drug level monitoring: Secondary | ICD-10-CM | POA: Diagnosis not present

## 2021-11-02 DIAGNOSIS — I059 Rheumatic mitral valve disease, unspecified: Secondary | ICD-10-CM | POA: Diagnosis not present

## 2021-11-02 DIAGNOSIS — I480 Paroxysmal atrial fibrillation: Secondary | ICD-10-CM | POA: Diagnosis not present

## 2021-11-02 DIAGNOSIS — Z952 Presence of prosthetic heart valve: Secondary | ICD-10-CM

## 2021-11-02 LAB — POCT INR: INR: 3.8 — AB (ref 2.0–3.0)

## 2021-11-02 NOTE — Patient Instructions (Signed)
DECREASE TO  0.5 tablet daily except 1 tablet every Monday, Wednesday and Friday.  ?-Recheck INR in 4 weeks. [facility] [phone] ?-[facility] [phone] ?

## 2021-11-04 ENCOUNTER — Telehealth: Payer: Self-pay | Admitting: Hematology

## 2021-11-04 NOTE — Telephone Encounter (Signed)
Scheduled follow-up appointments per 5/4 los. Patient is aware. 

## 2021-11-12 ENCOUNTER — Inpatient Hospital Stay (HOSPITAL_COMMUNITY)
Admission: EM | Admit: 2021-11-12 | Discharge: 2021-11-17 | DRG: 533 | Disposition: A | Discharge status: Skilled Nursing Facility | Payer: Medicare Other | Source: Skilled Nursing Facility | Attending: Family Medicine | Admitting: Family Medicine

## 2021-11-12 ENCOUNTER — Encounter (HOSPITAL_COMMUNITY): Payer: Self-pay | Admitting: Oncology

## 2021-11-12 ENCOUNTER — Emergency Department (HOSPITAL_COMMUNITY): Payer: Medicare Other

## 2021-11-12 ENCOUNTER — Other Ambulatory Visit: Payer: Self-pay

## 2021-11-12 DIAGNOSIS — M25522 Pain in left elbow: Secondary | ICD-10-CM

## 2021-11-12 DIAGNOSIS — M25462 Effusion, left knee: Secondary | ICD-10-CM | POA: Diagnosis not present

## 2021-11-12 DIAGNOSIS — Z952 Presence of prosthetic heart valve: Secondary | ICD-10-CM

## 2021-11-12 DIAGNOSIS — R9431 Abnormal electrocardiogram [ECG] [EKG]: Secondary | ICD-10-CM | POA: Diagnosis present

## 2021-11-12 DIAGNOSIS — N183 Chronic kidney disease, stage 3 unspecified: Secondary | ICD-10-CM | POA: Diagnosis present

## 2021-11-12 DIAGNOSIS — S7002XA Contusion of left hip, initial encounter: Secondary | ICD-10-CM

## 2021-11-12 DIAGNOSIS — Z8711 Personal history of peptic ulcer disease: Secondary | ICD-10-CM | POA: Diagnosis not present

## 2021-11-12 DIAGNOSIS — Z66 Do not resuscitate: Secondary | ICD-10-CM | POA: Diagnosis present

## 2021-11-12 DIAGNOSIS — D72819 Decreased white blood cell count, unspecified: Secondary | ICD-10-CM | POA: Diagnosis not present

## 2021-11-12 DIAGNOSIS — M47812 Spondylosis without myelopathy or radiculopathy, cervical region: Secondary | ICD-10-CM | POA: Diagnosis not present

## 2021-11-12 DIAGNOSIS — N179 Acute kidney failure, unspecified: Secondary | ICD-10-CM | POA: Diagnosis present

## 2021-11-12 DIAGNOSIS — Z87891 Personal history of nicotine dependence: Secondary | ICD-10-CM | POA: Diagnosis not present

## 2021-11-12 DIAGNOSIS — Y92099 Unspecified place in other non-institutional residence as the place of occurrence of the external cause: Secondary | ICD-10-CM

## 2021-11-12 DIAGNOSIS — L89151 Pressure ulcer of sacral region, stage 1: Secondary | ICD-10-CM | POA: Diagnosis present

## 2021-11-12 DIAGNOSIS — Z9049 Acquired absence of other specified parts of digestive tract: Secondary | ICD-10-CM

## 2021-11-12 DIAGNOSIS — I129 Hypertensive chronic kidney disease with stage 1 through stage 4 chronic kidney disease, or unspecified chronic kidney disease: Secondary | ICD-10-CM | POA: Diagnosis present

## 2021-11-12 DIAGNOSIS — D696 Thrombocytopenia, unspecified: Secondary | ICD-10-CM | POA: Diagnosis not present

## 2021-11-12 DIAGNOSIS — Z8249 Family history of ischemic heart disease and other diseases of the circulatory system: Secondary | ICD-10-CM

## 2021-11-12 DIAGNOSIS — D649 Anemia, unspecified: Secondary | ICD-10-CM

## 2021-11-12 DIAGNOSIS — M9702XA Periprosthetic fracture around internal prosthetic left hip joint, initial encounter: Secondary | ICD-10-CM | POA: Diagnosis present

## 2021-11-12 DIAGNOSIS — Z96641 Presence of right artificial hip joint: Secondary | ICD-10-CM | POA: Diagnosis present

## 2021-11-12 DIAGNOSIS — Z888 Allergy status to other drugs, medicaments and biological substances status: Secondary | ICD-10-CM

## 2021-11-12 DIAGNOSIS — I700 Atherosclerosis of aorta: Secondary | ICD-10-CM | POA: Diagnosis not present

## 2021-11-12 DIAGNOSIS — Z043 Encounter for examination and observation following other accident: Secondary | ICD-10-CM | POA: Diagnosis not present

## 2021-11-12 DIAGNOSIS — Z86007 Personal history of in-situ neoplasm of skin: Secondary | ICD-10-CM

## 2021-11-12 DIAGNOSIS — N400 Enlarged prostate without lower urinary tract symptoms: Secondary | ICD-10-CM | POA: Diagnosis not present

## 2021-11-12 DIAGNOSIS — W19XXXA Unspecified fall, initial encounter: Secondary | ICD-10-CM | POA: Diagnosis not present

## 2021-11-12 DIAGNOSIS — C799 Secondary malignant neoplasm of unspecified site: Secondary | ICD-10-CM | POA: Diagnosis present

## 2021-11-12 DIAGNOSIS — L8990 Pressure ulcer of unspecified site, unspecified stage: Secondary | ICD-10-CM

## 2021-11-12 DIAGNOSIS — Z7901 Long term (current) use of anticoagulants: Secondary | ICD-10-CM | POA: Diagnosis not present

## 2021-11-12 DIAGNOSIS — S300XXA Contusion of lower back and pelvis, initial encounter: Secondary | ICD-10-CM | POA: Diagnosis present

## 2021-11-12 DIAGNOSIS — Z801 Family history of malignant neoplasm of trachea, bronchus and lung: Secondary | ICD-10-CM

## 2021-11-12 DIAGNOSIS — M978XXA Periprosthetic fracture around other internal prosthetic joint, initial encounter: Secondary | ICD-10-CM

## 2021-11-12 DIAGNOSIS — S7012XA Contusion of left thigh, initial encounter: Secondary | ICD-10-CM | POA: Diagnosis present

## 2021-11-12 DIAGNOSIS — T148XXA Other injury of unspecified body region, initial encounter: Secondary | ICD-10-CM

## 2021-11-12 DIAGNOSIS — N1832 Chronic kidney disease, stage 3b: Secondary | ICD-10-CM | POA: Diagnosis not present

## 2021-11-12 DIAGNOSIS — Z79899 Other long term (current) drug therapy: Secondary | ICD-10-CM

## 2021-11-12 DIAGNOSIS — E785 Hyperlipidemia, unspecified: Secondary | ICD-10-CM | POA: Diagnosis not present

## 2021-11-12 DIAGNOSIS — S72002A Fracture of unspecified part of neck of left femur, initial encounter for closed fracture: Secondary | ICD-10-CM | POA: Insufficient documentation

## 2021-11-12 DIAGNOSIS — Z96649 Presence of unspecified artificial hip joint: Secondary | ICD-10-CM | POA: Diagnosis not present

## 2021-11-12 DIAGNOSIS — W010XXA Fall on same level from slipping, tripping and stumbling without subsequent striking against object, initial encounter: Secondary | ICD-10-CM | POA: Diagnosis present

## 2021-11-12 DIAGNOSIS — R609 Edema, unspecified: Secondary | ICD-10-CM | POA: Diagnosis not present

## 2021-11-12 DIAGNOSIS — D631 Anemia in chronic kidney disease: Secondary | ICD-10-CM | POA: Diagnosis present

## 2021-11-12 DIAGNOSIS — S32502A Unspecified fracture of left pubis, initial encounter for closed fracture: Secondary | ICD-10-CM | POA: Diagnosis not present

## 2021-11-12 DIAGNOSIS — E43 Unspecified severe protein-calorie malnutrition: Secondary | ICD-10-CM

## 2021-11-12 DIAGNOSIS — I959 Hypotension, unspecified: Secondary | ICD-10-CM | POA: Diagnosis not present

## 2021-11-12 DIAGNOSIS — Z825 Family history of asthma and other chronic lower respiratory diseases: Secondary | ICD-10-CM

## 2021-11-12 DIAGNOSIS — Z85038 Personal history of other malignant neoplasm of large intestine: Secondary | ICD-10-CM

## 2021-11-12 DIAGNOSIS — I4811 Longstanding persistent atrial fibrillation: Secondary | ICD-10-CM | POA: Diagnosis not present

## 2021-11-12 DIAGNOSIS — Z7401 Bed confinement status: Secondary | ICD-10-CM | POA: Diagnosis not present

## 2021-11-12 DIAGNOSIS — I10 Essential (primary) hypertension: Secondary | ICD-10-CM | POA: Diagnosis present

## 2021-11-12 DIAGNOSIS — N189 Chronic kidney disease, unspecified: Secondary | ICD-10-CM | POA: Diagnosis present

## 2021-11-12 DIAGNOSIS — Z85828 Personal history of other malignant neoplasm of skin: Secondary | ICD-10-CM

## 2021-11-12 DIAGNOSIS — G319 Degenerative disease of nervous system, unspecified: Secondary | ICD-10-CM | POA: Diagnosis not present

## 2021-11-12 DIAGNOSIS — S72142A Displaced intertrochanteric fracture of left femur, initial encounter for closed fracture: Secondary | ICD-10-CM | POA: Diagnosis not present

## 2021-11-12 DIAGNOSIS — S728X2A Other fracture of left femur, initial encounter for closed fracture: Principal | ICD-10-CM | POA: Diagnosis present

## 2021-11-12 DIAGNOSIS — S72112A Displaced fracture of greater trochanter of left femur, initial encounter for closed fracture: Secondary | ICD-10-CM | POA: Diagnosis not present

## 2021-11-12 LAB — CBC WITH DIFFERENTIAL/PLATELET
Abs Immature Granulocytes: 0.04 10*3/uL (ref 0.00–0.07)
Basophils Absolute: 0 10*3/uL (ref 0.0–0.1)
Basophils Relative: 0 %
Eosinophils Absolute: 0.1 10*3/uL (ref 0.0–0.5)
Eosinophils Relative: 3 %
HCT: 31.4 % — ABNORMAL LOW (ref 39.0–52.0)
Hemoglobin: 10 g/dL — ABNORMAL LOW (ref 13.0–17.0)
Immature Granulocytes: 1 %
Lymphocytes Relative: 12 %
Lymphs Abs: 0.6 10*3/uL — ABNORMAL LOW (ref 0.7–4.0)
MCH: 31.9 pg (ref 26.0–34.0)
MCHC: 31.8 g/dL (ref 30.0–36.0)
MCV: 100.3 fL — ABNORMAL HIGH (ref 80.0–100.0)
Monocytes Absolute: 0.6 10*3/uL (ref 0.1–1.0)
Monocytes Relative: 13 %
Neutro Abs: 3.4 10*3/uL (ref 1.7–7.7)
Neutrophils Relative %: 71 %
Platelets: 161 10*3/uL (ref 150–400)
RBC: 3.13 MIL/uL — ABNORMAL LOW (ref 4.22–5.81)
RDW: 18 % — ABNORMAL HIGH (ref 11.5–15.5)
WBC: 4.7 10*3/uL (ref 4.0–10.5)
nRBC: 0 % (ref 0.0–0.2)

## 2021-11-12 LAB — BASIC METABOLIC PANEL
Anion gap: 7 (ref 5–15)
BUN: 43 mg/dL — ABNORMAL HIGH (ref 8–23)
CO2: 28 mmol/L (ref 22–32)
Calcium: 9.3 mg/dL (ref 8.9–10.3)
Chloride: 101 mmol/L (ref 98–111)
Creatinine, Ser: 2.05 mg/dL — ABNORMAL HIGH (ref 0.61–1.24)
GFR, Estimated: 29 mL/min — ABNORMAL LOW (ref >60)
Glucose, Bld: 127 mg/dL — ABNORMAL HIGH (ref 70–99)
Potassium: 4.4 mmol/L (ref 3.5–5.1)
Sodium: 136 mmol/L (ref 135–145)

## 2021-11-12 LAB — PROTIME-INR
INR: 2.6 — ABNORMAL HIGH (ref 0.8–1.2)
Prothrombin Time: 27.8 seconds — ABNORMAL HIGH (ref 11.4–15.2)

## 2021-11-12 MED ORDER — FENTANYL CITRATE PF 50 MCG/ML IJ SOSY
25.0000 ug | PREFILLED_SYRINGE | Freq: Once | INTRAMUSCULAR | Status: AC
  Administered 2021-11-12: 25 ug via INTRAVENOUS
  Filled 2021-11-12: qty 1

## 2021-11-12 MED ORDER — OXYCODONE HCL 5 MG PO TABS
5.0000 mg | ORAL_TABLET | Freq: Four times a day (QID) | ORAL | Status: DC | PRN
  Administered 2021-11-13 – 2021-11-16 (×5): 5 mg via ORAL
  Filled 2021-11-12 (×6): qty 1

## 2021-11-12 MED ORDER — SENNOSIDES-DOCUSATE SODIUM 8.6-50 MG PO TABS: 1.0000 | ORAL_TABLET | Freq: Every evening | ORAL | Status: DC | PRN

## 2021-11-12 MED ORDER — FENTANYL CITRATE PF 50 MCG/ML IJ SOSY: 12.5000 ug | PREFILLED_SYRINGE | INTRAMUSCULAR | Status: DC | PRN

## 2021-11-12 MED ORDER — ENSURE ENLIVE PO LIQD: 237.0000 mL | Freq: Two times a day (BID) | ORAL | Status: DC

## 2021-11-12 MED ORDER — SODIUM CHLORIDE 0.9 % IV SOLN: INTRAVENOUS | Status: AC

## 2021-11-12 MED ORDER — ISOSORBIDE MONONITRATE ER 30 MG PO TB24
30.0000 mg | ORAL_TABLET | Freq: Every day | ORAL | Status: DC
Start: 2021-11-13 — End: 2021-11-17
  Administered 2021-11-14 – 2021-11-15 (×2): 30 mg via ORAL
  Filled 2021-11-12 (×2): qty 1

## 2021-11-12 MED ORDER — HYDRALAZINE HCL 25 MG PO TABS
25.0000 mg | ORAL_TABLET | Freq: Every day | ORAL | Status: DC
  Administered 2021-11-14 – 2021-11-15 (×2): 25 mg via ORAL
  Filled 2021-11-12 (×2): qty 1

## 2021-11-12 MED ORDER — BISOPROLOL FUMARATE 5 MG PO TABS
5.0000 mg | ORAL_TABLET | Freq: Every day | ORAL | Status: DC
  Administered 2021-11-14 – 2021-11-17 (×4): 5 mg via ORAL
  Filled 2021-11-12 (×5): qty 1

## 2021-11-12 MED ORDER — LORAZEPAM 1 MG PO TABS
1.0000 mg | ORAL_TABLET | Freq: Every evening | ORAL | Status: DC | PRN
  Administered 2021-11-13 – 2021-11-16 (×3): 1 mg via ORAL
  Filled 2021-11-12 (×3): qty 1

## 2021-11-12 NOTE — ED Notes (Signed)
Transport called to take patient up to floor.

## 2021-11-12 NOTE — Plan of Care (Signed)
  Problem: Clinical Measurements: Goal: Diagnostic test results will improve Outcome: Progressing   Problem: Clinical Measurements: Goal: Respiratory complications will improve Outcome: Progressing   Problem: Clinical Measurements: Goal: Cardiovascular complication will be avoided Outcome: Progressing   Problem: Coping: Goal: Level of anxiety will decrease Outcome: Progressing   Problem: Pain Managment: Goal: General experience of comfort will improve Outcome: Progressing   Problem: Safety: Goal: Ability to remain free from injury will improve Outcome: Progressing   Problem: Skin Integrity: Goal: Risk for impaired skin integrity will decrease Outcome: Progressing

## 2021-11-12 NOTE — ED Triage Notes (Signed)
Pt bib [facility] from [facility] s/p witnessed fall this morning. Pt reports feeling sore directly after fall however refused coming in. Later in the afternoon pt attempted to stand to find he could not bare weight on his left leg. Pt does have rods to left femur from a fx s/p fall last year.

## 2021-11-12 NOTE — H&P (Signed)
History and Physical    Richard Spears KXF:[id number] DOB: 06-08-1926 DOA: 11/12/2021  PCP: [name], MD   Patient coming from: [facility]   Chief Complaint: Left hip pain   HPI: Richard Spears is a pleasant 88 y.o. male with medical history significant for mechanical mitral valve and atrial fibrillation on warfarin, CKD stage IIIb, hypertension, ORIF of distal left femur in March 2022, and cancer of the transverse colon, now presenting to the emergency department with left hip pain after fall.  The patient reports that he was in his usual state when he was using the restroom, believes he became tangled in his pants and briefs, and fell onto his buttock.  He was experiencing pain immediately but initially refused transport to the ED until he was unable to bear weight later in the day.  He denies any recent chest pain or palpitations, denies cough or shortness of breath, and denies hitting his head or losing consciousness when he fell.  ED Course: Upon arrival to the ED, patient is found to be afebrile and saturating well on room air with stable blood pressure.  EKG features atrial fibrillation with QTc interval 539 ms.  CT of the left hip is suggestive of periprosthetic left femur fracture and left posterior buttock/upper thigh hematoma.  Orthopedic surgery was consulted by the ED physician and the patient was treated with fentanyl.  Review of Systems:  All other systems reviewed and apart from HPI, are negative.  Past Medical History:  Diagnosis Date   Anemia    leakoppenia   BPH (benign prostatic hypertrophy)    Bullous pemphigoid    Wilhemina Spears, March 2011, right forearm squamous cell carcinoma   Chronic anticoagulation    systemic   Colon polyp    transverse, 2002   History of peptic ulcer    remote, 3/95   Hx of actinic keratosis    Hx of basal cell carcinoma    Hx of squamous cell carcinoma of skin    Hyperlipidemia    Left inguinal hernia    Moderate aortic  insufficiency 2009   audible aortic insufficiency on 1/09 echo   PAF (paroxysmal atrial fibrillation) ([place]) 01/17/2014   On Warfarin.   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 Jude mechanical valve   ORIF FEMUR FRACTURE Left 08/28/2020   Procedure: OPEN REDUCTION INTERNAL FIXATION (ORIF) DISTAL FEMUR FRACTURE;  Surgeon: Rod Can, MD;  Location: [facility];  Service: Orthopedics;  Laterality: Left;   PARTIAL COLECTOMY N/A 06/30/2021   Procedure: PARTIAL COLECTOMY;  Surgeon: Clovis Riley, MD;  Location: [facility];  Service: General;  Laterality: N/A;   TOTAL HIP ARTHROPLASTY Right 10/12/2017   Procedure: RIGHT TOTAL HIP ARTHROPLASTY ANTERIOR APPROACH;  Surgeon: Gaynelle Arabian, MD;  Location:  [facility];  Service: Orthopedics;  Laterality: Right;   TRANSTHORACIC ECHOCARDIOGRAM  12/2018   Unable to assess diastolic function because of A. fib. Normal RV function, but moderately elevated RVSP.  Severe biatrial enlargement. S/P St Jude bileaflet mechanical MVR that appears to be functioning normally. Mitral valve regurgitation cannot assess due to mechanical valve shadowing. MV Mean grad: 7.0 mmHg MV Area (PHT): 3.38 cm (stable for valve).  Mild Ao Sclerosis, Mild-Mod AI   TRANSTHORACIC ECHOCARDIOGRAM  08/'17; 10/'18    a) Mild conc  LVH. EF 55-60%. No RWMA. Mod AI. Mechanical MV prosthesis functioning properly. LAD dilation.;; b)  EF 55-60%.  Mo AI.  Bileaflet Saint Jude mechanical MV with no paravalvular leak.  Severe LA dilation.  Minimally elevated PAP    Social History:   reports that he quit smoking about 63 years ago. His smoking use included cigarettes. He started smoking about 76 years ago. He has a 13.00 pack-year smoking history. He has never used smokeless tobacco. He reports current alcohol use. He reports that he does not use drugs.  Allergies  Allergen Reactions   Flexeril [Cyclobenzaprine] Diarrhea    Family History  Problem Relation Age of Onset   Hypertension Mother    Lung cancer Sister    COPD Brother    Cancer Brother    Other Sister        polio   Lupus Son      Prior to Admission medications   Medication Sig Start Date End Date Taking? Authorizing Provider  acetaminophen (TYLENOL) 500 MG tablet Take 1,000 mg by mouth 3 (three) times daily as needed for moderate pain.   Yes [provider]  bisoprolol (ZEBETA) 5 MG tablet Take 1 tablet (5 mg total) by mouth daily. 05/19/21  Yes Leonie Man, MD  fluticasone [facility]) 50 MCG/ACT nasal spray Place 1 spray into both nostrils daily as needed for allergies or rhinitis.   Yes [provider]  furosemide (LASIX) 40 MG tablet Take 20-40 mg by mouth See admin instructions. Take 20 mg on Monday Wednesday and Friday and 40 mg on all other days.   Yes [provider]  hydrALAZINE (APRESOLINE) 25 MG tablet Take 25 mg by mouth daily at lunch, may take an  extra tablet as needed for systolic BP greater than 585 mmHg daily Patient taking differently: Take 25 mg by mouth daily. Take 25 mg by mouth daily at lunch, may take an  extra tablet as needed for systolic BP greater than 277 mmHg daily 06/17/21  Yes Leonie Man, MD  isosorbide mononitrate (IMDUR) 30 MG 24 hr tablet Take 30 mg by mouth daily.   Yes [provider]  lactose free nutrition (BOOST) LIQD Take 237 mLs by mouth daily.   Yes [provider]  loperamide (IMODIUM) 2 MG capsule Take 1 capsule (2 mg total) by mouth as needed for diarrhea or loose stools. Patient taking differently: Take 2 mg by mouth daily as needed for diarrhea or loose stools. 10/29/21  Yes Alla Feeling, NP  LORazepam (ATIVAN) 1 MG tablet Take 0.5 tablets (0.5 mg total) by mouth daily as needed for anxiety. Patient taking differently: Take 1 mg by mouth at bedtime. 07/17/21  Yes Ghimire, Henreitta Leber, MD  Multiple Vitamins-Minerals (MULTIVITAMIN PO) Take 1 tablet by mouth daily.   Yes [provider]  Multiple Vitamins-Minerals (PRESERVISION AREDS 2) CAPS Take 1 capsule by mouth daily. 04/07/21  Yes [place], Peter M, MD  oxyCODONE (  OXY IR/ROXICODONE) 5 MG immediate release tablet Take 1/2 to 1 tablet  by mouth every 8 hours as needed for severe pain. 10/26/21  Yes [name], MD  polyethylene glycol (MIRALAX / GLYCOLAX) 17 g packet Take 17 g by mouth daily as needed for mild constipation.   Yes [provider]  warfarin (COUMADIN) 5 MG tablet Take 1 tablet (5 mg total) by mouth daily. Patient taking differently: Take 2.5-5 mg by mouth daily. Take 5 mg Monday, Wednesday and Friday and 2.5 mg on all other days. 07/17/21  Yes Ghimire, Henreitta Leber, MD  ferrous sulfate 325 (65 FE) MG tablet Take 1 tablet (325 mg total) by mouth 2 (two) times daily with a meal. Patient not taking: Reported on 11/12/2021 07/17/21   Jonetta Osgood, MD  traMADol (ULTRAM) 50 MG tablet TAKE 1 TABLET EVERY 8 HOURS AS NEEDED Patient not taking: Reported on 11/12/2021 09/16/21   [name], MD    Physical Exam: Vitals:   11/12/21 1840 11/12/21 1910 11/12/21 2000 11/12/21 2120  BP: 116/69 117/81 124/71 110/61  Pulse: (!) 111 83 90 64  Resp: (!) '23 18 18 17  '$ Temp:  (!) 97.5 F (36.4 C) 98 F (36.7 C) 97.9 F (36.6 C)  TempSrc:  Oral Oral Oral  SpO2: 97% 96% 98% 97%  Weight:  63 kg     Height:  6' (1.829 m)      Constitutional: NAD, calm  Eyes: PERTLA, lids and conjunctivae normal ENMT: Mucous membranes are moist. Posterior pharynx clear of any exudate or lesions.   Neck: supple, no masses  Respiratory:  no wheezing, no crackles. No accessory muscle use.  Cardiovascular: Rate ~ 80 and irregular. No significant JVD. Abdomen: No distension, no tenderness, soft. Bowel sounds active.  Musculoskeletal: no clubbing / cyanosis. Left hip tenderness and ecchymosis, neurovascularly intact.   Skin: Hyperpigmentation involving lower legs b/l. Papular rash and excoriations across chest. Warm, dry, well-perfused. Neurologic: CN 2-12 grossly intact. Moving all extremities. Alert and oriented.  Psychiatric: Pleasant. Cooperative.    Labs and Imaging on Admission: I have personally reviewed following labs and imaging studies  CBC: Recent Labs  Lab 11/12/21 1707  WBC 4.7  NEUTROABS 3.4  HGB 10.0*  HCT 31.4*  MCV 100.3*  PLT 660   Basic Metabolic Panel: Recent Labs  Lab 11/12/21 1707  NA 136  K 4.4  CL 101  CO2 28  GLUCOSE 127*  BUN 43*  CREATININE 2.05*  CALCIUM 9.3   GFR: Estimated Creatinine Clearance: 19.6 mL/min (A) (by C-G formula based on SCr of 2.05 mg/dL (H)). Liver Function Tests: No results for input(s): AST, ALT, ALKPHOS, BILITOT, PROT, ALBUMIN in the last 168 hours. No results for input(s): LIPASE, AMYLASE in the last 168 hours. No results for input(s): AMMONIA in the last 168 hours. Coagulation Profile: Recent Labs  Lab 11/12/21 1707  INR 2.6*   Cardiac Enzymes: No results for input(s): CKTOTAL, CKMB, CKMBINDEX, TROPONINI in the last 168 hours. BNP (last 3 results) No results for input(s): PROBNP in the last 8760 hours. HbA1C: No results for input(s): HGBA1C in the last 72 hours. CBG: No results for input(s): GLUCAP in the last 168 hours. Lipid Profile: No results for input(s): CHOL, HDL, LDLCALC, TRIG, CHOLHDL, LDLDIRECT in the last 72  hours. Thyroid Function Tests: No results for input(s): TSH, T4TOTAL, FREET4, T3FREE, THYROIDAB in the last 72 hours. Anemia Panel: No results for input(s): VITAMINB12, FOLATE, FERRITIN, TIBC, IRON, RETICCTPCT in the last 72  hours. Urine analysis:    Component Value Date/Time   COLORURINE YELLOW 07/06/2021 [place] 07/06/2021 1456   LABSPEC 1.020 07/06/2021 1456   PHURINE 6.0 07/06/2021 1456   GLUCOSEU NEGATIVE 07/06/2021 1456   HGBUR NEGATIVE 07/06/2021 [place] 07/06/2021 1456   KETONESUR NEGATIVE 07/06/2021 1456   PROTEINUR NEGATIVE 07/06/2021 1456   NITRITE NEGATIVE 07/06/2021 1456   LEUKOCYTESUR NEGATIVE 07/06/2021 1456   Sepsis Labs: '@LABRCNTIP'$ (procalcitonin:4,lacticidven:4) )No results found for this or any previous visit (from the past 240 hour(s)).   Radiological Exams on Admission: DG Chest 1 View  Result Date: 11/12/2021 CLINICAL DATA:  Fall. EXAM: CHEST  1 VIEW COMPARISON:  PET-CT 10/16/2021 FINDINGS: Cardiomegaly. Atherosclerotic calcification of the aortic arch. Prosthetic mitral valve. Prior median sternotomy. Hazy opacities along the mid lungs are probably from skin folds. Old healed right rib fractures. IMPRESSION: 1. Stable cardiomegaly. 2. Hazy densities along the mid lungs bilaterally, probably skin folds. 3.  Aortic Atherosclerosis (ICD10-I70.0). Electronically Signed   By: Van Clines M.D.   On: 11/12/2021 18:42   CT Head Wo Contrast  Result Date: 11/12/2021 CLINICAL DATA:  Fall. EXAM: CT HEAD WITHOUT CONTRAST CT CERVICAL SPINE WITHOUT CONTRAST TECHNIQUE: Multidetector CT imaging of the head and cervical spine was performed following the standard protocol without intravenous contrast. Multiplanar CT image reconstructions of the cervical spine were also generated. RADIATION DOSE REDUCTION: This exam was performed according to the departmental dose-optimization program which includes automated exposure control, adjustment  of the mA and/or kV according to patient size and/or use of iterative reconstruction technique. COMPARISON:  None Available. FINDINGS: CT HEAD FINDINGS Brain: No evidence of acute infarction, hemorrhage, hydrocephalus, extra-axial collection or mass lesion/mass effect. There is moderate diffuse atrophy. There is mild periventricular white matter hypodensity, likely chronic small vessel ischemic change. Vascular: Atherosclerotic calcifications are present within the cavernous internal carotid arteries. Skull: Normal. Negative for fracture or focal lesion. Sinuses/Orbits: No acute finding. Other: None. CT CERVICAL SPINE FINDINGS Alignment: There is trace anterolisthesis at C6-C7 which is likely degenerative. Alignment is otherwise anatomic. Skull base and vertebrae: Bones are osteopenic. No acute fracture. No primary bone lesion or focal pathologic process. Soft tissues and spinal canal: No prevertebral fluid or swelling. No visible canal hematoma. Disc levels: There is severe disc space narrowing at C3-C4, C4-C5 and C5-C6. No significant central canal or neural foraminal stenosis at any level. Upper chest: Negative. Other: None. IMPRESSION: 1.  No acute intracranial process. 2. No acute fracture or traumatic subluxation of the cervical spine. 3. Moderate diffuse brain atrophy. Mild chronic small vessel ischemic change. 4.  Degenerative changes of the cervical spine as above. Electronically Signed   By: Ronney Asters M.D.   On: 11/12/2021 17:52   CT Cervical Spine Wo Contrast  Result Date: 11/12/2021 CLINICAL DATA:  Fall. EXAM: CT HEAD WITHOUT CONTRAST CT CERVICAL SPINE WITHOUT CONTRAST TECHNIQUE: Multidetector CT imaging of the head and cervical spine was performed following the standard protocol without intravenous contrast. Multiplanar CT image reconstructions of the cervical spine were also generated. RADIATION DOSE REDUCTION: This exam was performed according to the departmental dose-optimization program  which includes automated exposure control, adjustment of the mA and/or kV according to patient size and/or use of iterative reconstruction technique. COMPARISON:  None Available. FINDINGS: CT HEAD FINDINGS Brain: No evidence of acute infarction, hemorrhage, hydrocephalus, extra-axial collection or mass lesion/mass effect. There is moderate diffuse atrophy. There is mild periventricular white matter hypodensity, likely chronic small vessel  ischemic change. Vascular: Atherosclerotic calcifications are present within the cavernous internal carotid arteries. Skull: Normal. Negative for fracture or focal lesion. Sinuses/Orbits: No acute finding. Other: None. CT CERVICAL SPINE FINDINGS Alignment: There is trace anterolisthesis at C6-C7 which is likely degenerative. Alignment is otherwise anatomic. Skull base and vertebrae: Bones are osteopenic. No acute fracture. No primary bone lesion or focal pathologic process. Soft tissues and spinal canal: No prevertebral fluid or swelling. No visible canal hematoma. Disc levels: There is severe disc space narrowing at C3-C4, C4-C5 and C5-C6. No significant central canal or neural foraminal stenosis at any level. Upper chest: Negative. Other: None. IMPRESSION: 1.  No acute intracranial process. 2. No acute fracture or traumatic subluxation of the cervical spine. 3. Moderate diffuse brain atrophy. Mild chronic small vessel ischemic change. 4.  Degenerative changes of the cervical spine as above. Electronically Signed   By: Ronney Asters M.D.   On: 11/12/2021 17:52   CT FEMUR LEFT WO CONTRAST  Result Date: 11/12/2021 CLINICAL DATA:  Trauma, fall, possible fracture. Unable to bear weight. EXAM: CT OF THE LOWER LEFT EXTREMITY WITHOUT CONTRAST TECHNIQUE: Multidetector CT imaging of the lower left extremity was performed according to the standard protocol. RADIATION DOSE REDUCTION: This exam was performed according to the departmental dose-optimization program which includes automated  exposure control, adjustment of the mA and/or kV according to patient size and/or use of iterative reconstruction technique. COMPARISON:  Radiographs 08/28/2020 FINDINGS: Bones/Joint/Cartilage Streak artifact associated with the left total hip prosthesis left lateral buttress plate and screw fixator noted. Old healed left inferior pubic ramus fracture, image 72 series 2. Left femur proximal periprosthetic fracture involving the greater trochanter and anterolateral left proximal femoral metaphysis, with about 1.2 cm of cortical step-off anteriorly on image 74 series 4. Suspected vertical nondisplaced component of this fracture extending distally in the femoral shaft anteromedial to the stem of the prosthesis for example on image 126 of series 2. This becomes indistinct distally. There is a previous oblique fracture of the femoral diaphysis with healing response noted along the upper fracture margins for example on image 184 of series 2, traversed by the buttress plate and with cerclage is in the vicinity of the fracture. On image 219 of series 2, there is a 1.8 by 3.5 cm cortical defect in the anterior femoral shaft which is probably chronic. Osteoarthritis of the knee is present. Ligaments Suboptimally assessed by CT. Muscles and Tendons Unremarkable Soft tissues Indirect left inguinal hernia contains loop of bowel without strangulation or obstruction. Substantial atherosclerosis. 7.9 by 5.1 by 14.4 cm (volume = 300 cm^3) mixed density subcutaneous structure along the left buttock and upper thigh region favoring hematoma. This partially abuts the superficial fascia margin. There is substantial surrounding subcutaneous edema along the buttock and tracking in the left lateral thigh. IMPRESSION: 1. 300 cc mixed density subcutaneous lesion along the left posterior buttock/upper thigh region compatible with hematoma. Cannot completely exclude Sherry Ruffing lesion as this abuts the superficial fascia margin. Surrounding  subcutaneous edema noted. 2. Primarily vertically oriented fracture extending through the greater tuberosity and into the anterior cortex of the proximal femoral metaphysis, with about 1.5 cm of step-off anteriorly, most compatible with a periprosthetic fracture. Suspected small nondisplaced vertical component extending distally in the femoral shaft anteromedially. 3. Remote mid to distal femoral fracture with buttress plate and screw fixation. Likely chronic anterior cortical defect in the distal femoral metaphysis. 4. Left inguinal hernia contains bowel. 5. Old left inferior pubic ramus fracture. Electronically Signed  By: Van Clines M.D.   On: 11/12/2021 18:06   DG Knee Complete 4 Views Left  Result Date: 11/12/2021 CLINICAL DATA:  Fall EXAM: LEFT KNEE - COMPLETE 4+ VIEW COMPARISON:  CT scan from 11/12/2021 FINDINGS: Bony demineralization. Distal femoral buttress plate laterally with cerclage fixation along the site of the prior oblique shaft fracture. Articular space narrowing in the knee especially in the medial compartment. Trace knee effusion. Substantial spurring in the patellofemoral joint. Distal SFA and popliteal artery atherosclerotic calcification. IMPRESSION: 1. Substantial osteoarthritis of the knee.  Small knee effusion. 2. Postoperative findings in the distal femur. 3. Atherosclerosis. Electronically Signed   By: Van Clines M.D.   On: 11/12/2021 18:40   DG Hip Unilat With Pelvis 2-3 Views Left  Result Date: 11/12/2021 CLINICAL DATA:  Fall. EXAM: DG HIP (WITH OR WITHOUT PELVIS) 2-3V LEFT COMPARISON:  Left hip x-ray 08/28/2020 FINDINGS: Bones are diffusely osteopenic. Left hip arthroplasty is again seen in anatomic alignment. There is a lateral mid femoral sideplate, partially imaged. There is no evidence for hardware loosening. There is an acute oblique fracture through the lateral aspect of the proximal left femur just inferior to the intratrochanteric region. Fracture  fragment is distracted 1 cm. There is a healed left inferior pubic ramus fracture. Right hip arthroplasty appears uncomplicated. IMPRESSION: 1. Acute fracture through the proximal left femur just inferior to the intratrochanteric region. 2. Left hip arthroplasty and femoral sideplate appear in anatomic alignment. Electronically Signed   By: Ronney Asters M.D.   On: 11/12/2021 18:41    EKG: Independently reviewed. Atrial fibrillation, QTc 539 ms.   Assessment/Plan   1. Periprosthetic left femur fracture; left buttock hematoma  - Presents with left hip after a fall and is found to have periprosthetic fracture and left buttock hematoma  - Appreciate orthopedic surgery consultation  - Hold warfarin, repeat INR in am, continue pain-control, follow-up on ortho recommendations    2. Mechanical mitral valve  - INR 2.6 in ED  - Continue warfarin or start IV heparin depending on plan from orthopedic surgery   3. Atrial fibrillation  - CHADS-VASc at least 3 (age x2, HTN)  - Continue bisoprolol, continue warfarin or start IV heparin based on orthopedic surgery recommendations    4. CKD IIIb  - SCr is 2.05 on admission, up from 1.79 two weeks earlier  - Hold Lasix initially, renally-dose medications, monitor    5. Prolonged QT interval  - QTc is 539 ms in ED  - Check magnesium level, avoid QT-prolonging medications    6. Hypertension  - Continue bisoprolol and hydralazine as tolerated   7. Colon cancer  - Cancer of transverse colon s/p partial colectomy and imaging suspicious for nodal metastasis and local recurrence  - He started Keytruda last month     DVT prophylaxis: Warfarin pta, plan to continue warfarin or use IV heparin depending on ortho recommendations  Code Status: DNR, discussed with patient  Level of Care: Level of care: Med-Surg Family Communication: none present  Disposition Plan:  Patient is from: ILF  Anticipated d/c is to: TBD Anticipated d/c date is: 5/20 or 11/15/21   Patient currently: Pending ortho consultation, improved/stable renal function, stable hematoma   Consults called: orthopedic surgery  Admission status: Inpatient     Vianne Bulls, MD [facility]  11/13/2021, 12:20 AM

## 2021-11-12 NOTE — ED Provider Notes (Signed)
[facility] DEPT Provider Note   CSN: [id number] Arrival date & time: 11/12/21  1559     History  Chief Complaint  Patient presents with   Richard Spears is a 88 y.o. male.  Patient with paroxysmal atrial fibrillation, mechanical valve on Coumadin, previous left hip surgery here after falling at his assisted living facility.  States he was rushing to the bathroom and slipped landing on his left buttock and leg.  Is able to stand but not bear weight.  Denies hitting his head or losing consciousness.  He reportedly fell several hours ago and declined transport initially.  Now he has severe pain and cannot walk.  He has had his hip replacement previously.  Denies head, neck, back, chest or abdominal pain.  Most of his pain to his left buttock and hip and radiates down his left leg.  No focal weakness, numbness or tingling.  The history is provided by the patient and the EMS personnel. The history is limited by the condition of the patient.  Fall Pertinent negatives include no chest pain, no abdominal pain, no headaches and no shortness of breath.      Home Medications Prior to Admission medications   Medication Sig Start Date End Date Taking? Authorizing Provider  acetaminophen (TYLENOL) 500 MG tablet Take 1,000 mg by mouth 3 (three) times daily as needed for mild pain.    [provider]  bisoprolol (ZEBETA) 5 MG tablet Take 1 tablet (5 mg total) by mouth daily. 05/19/21   Leonie Man, MD  ferrous sulfate 325 (65 FE) MG tablet Take 1 tablet (325 mg total) by mouth 2 (two) times daily with a meal. Patient taking differently: Take 325 mg by mouth daily. 07/17/21   Ghimire, Henreitta Leber, MD  fluticasone (FLONASE) 50 MCG/ACT nasal spray Place 1 spray into both nostrils daily as needed for allergies or rhinitis.    [provider]  hydrALAZINE (APRESOLINE) 25 MG tablet Take 25 mg by mouth daily at lunch, may take an  extra tablet  as needed for systolic BP greater than 481 mmHg daily 06/17/21   Leonie Man, MD  isosorbide mononitrate (IMDUR) 30 MG 24 hr tablet Take 15 mg by mouth daily.    [provider]  lactose free nutrition (BOOST) LIQD Take 237 mLs by mouth 2 (two) times daily between meals.    [provider]  loperamide (IMODIUM) 2 MG capsule Take 1 capsule (2 mg total) by mouth as needed for diarrhea or loose stools. 10/29/21   Alla Feeling, NP  LORazepam (ATIVAN) 1 MG tablet Take 0.5 tablets (0.5 mg total) by mouth daily as needed for anxiety. 07/17/21   Ghimire, Henreitta Leber, MD  Multiple Vitamins-Minerals (MULTIVITAMIN PO) Take 1 tablet by mouth daily.    [provider]  Multiple Vitamins-Minerals (PRESERVISION AREDS 2) CAPS Take 1 capsule by mouth daily. 04/07/21   Martinique, Peter M, MD  oxyCODONE (OXY IR/ROXICODONE) 5 MG immediate release tablet Take 1/2 to 1 tablet  by mouth every 8 hours as needed for severe pain. 10/26/21   [name], MD  polyethylene glycol (MIRALAX / GLYCOLAX) 17 g packet Take 17 g by mouth daily as needed for mild constipation.    [provider]  traMADol (ULTRAM) 50 MG tablet TAKE 1 TABLET EVERY 8 HOURS AS NEEDED 09/16/21   [name], MD  warfarin (COUMADIN) 5 MG tablet Take 1 tablet (5 mg total) by mouth  daily. Patient taking differently: Take 5 mg by mouth daily. Take 1 and 1/2 tablet by mouth once daily as directed 07/17/21   Jonetta Osgood, MD      Allergies    Flexeril [cyclobenzaprine]    Review of Systems   Review of Systems  Constitutional:  Negative for activity change, appetite change and fever.  HENT:  Negative for congestion and rhinorrhea.   Respiratory:  Negative for cough, chest tightness and shortness of breath.   Cardiovascular:  Negative for chest pain.  Gastrointestinal:  Negative for abdominal pain, nausea and vomiting.  Musculoskeletal:  Positive for arthralgias and myalgias.  Neurological:  Negative for dizziness,  weakness and headaches.   all other systems are negative except as noted in the HPI and PMH.   Physical Exam Updated Vital Signs BP (!) 107/52 (BP Location: Left Arm)   Pulse 78   Temp 98.4 F (36.9 C) (Oral)   Resp 15   SpO2 99%  Physical Exam Vitals and nursing note reviewed.  Constitutional:      General: He is not in acute distress.    Appearance: He is well-developed.  HENT:     Head: Normocephalic and atraumatic.     Mouth/Throat:     Pharynx: No oropharyngeal exudate.  Eyes:     Conjunctiva/sclera: Conjunctivae normal.     Pupils: Pupils are equal, round, and reactive to light.  Neck:     Comments: No C spine tenderness Cardiovascular:     Rate and Rhythm: Normal rate and regular rhythm.     Heart sounds: Normal heart sounds. No murmur heard. Pulmonary:     Effort: Pulmonary effort is normal. No respiratory distress.     Breath sounds: Normal breath sounds.  Chest:     Chest wall: No tenderness.  Abdominal:     Palpations: Abdomen is soft.     Tenderness: There is no abdominal tenderness. There is no guarding or rebound.  Musculoskeletal:        General: Tenderness and signs of injury present.     Cervical back: Normal range of motion and neck supple.     Comments: No shortening or external rotation of left leg.  There is a large apparent hematoma to the left lateral hip and gluteus muscle.  Intact DP pulse bilaterally.  Skin tears and abrasions to left fourth finger and left proximal forearm.  No bony tenderness  Skin:    General: Skin is warm.  Neurological:     Mental Status: He is alert and oriented to person, place, and time.     Cranial Nerves: No cranial nerve deficit.     Motor: No abnormal muscle tone.     Coordination: Coordination normal.     Comments: No ataxia on finger to nose bilaterally. No pronator drift. 5/5 strength throughout. CN 2-12 intact.Equal grip strength. Sensation intact.   Psychiatric:        Behavior: Behavior normal.    ED  Results / Procedures / Treatments   Labs (all labs ordered are listed, but only abnormal results are displayed) Labs Reviewed  CBC WITH DIFFERENTIAL/PLATELET - Abnormal; Notable for the following components:      Result Value   RBC 3.13 (*)    Hemoglobin 10.0 (*)    HCT 31.4 (*)    MCV 100.3 (*)    RDW 18.0 (*)    Lymphs Abs 0.6 (*)    All other components within normal limits  BASIC METABOLIC PANEL - Abnormal;  Notable for the following components:   Glucose, Bld 127 (*)    BUN 43 (*)    Creatinine, Ser 2.05 (*)    GFR, Estimated 29 (*)    All other components within normal limits  PROTIME-INR - Abnormal; Notable for the following components:   Prothrombin Time 27.8 (*)    INR 2.6 (*)    All other components within normal limits  SURGICAL PCR SCREEN  PROTIME-INR  MAGNESIUM  CBC  BASIC METABOLIC PANEL  TYPE AND SCREEN    EKG EKG Interpretation  Date/Time:  Thursday Nov 12 2021 17:03:34 EDT Ventricular Rate:  98 PR Interval:    QRS Duration: 109 QT Interval:  422 QTC Calculation: 539 R Axis:   16 Text Interpretation: Atrial fibrillation Low voltage, extremity leads RSR' in V1 or V2, right VCD or RVH Prolonged QT interval No significant change was found Confirmed by Ezequiel Essex [id number]) on 11/12/2021 6:14:18 PM  Radiology DG Chest 1 View  Result Date: 11/12/2021 CLINICAL DATA:  Fall. EXAM: CHEST  1 VIEW COMPARISON:  PET-CT 10/16/2021 FINDINGS: Cardiomegaly. Atherosclerotic calcification of the aortic arch. Prosthetic mitral valve. Prior median sternotomy. Hazy opacities along the mid lungs are probably from skin folds. Old healed right rib fractures. IMPRESSION: 1. Stable cardiomegaly. 2. Hazy densities along the mid lungs bilaterally, probably skin folds. 3.  Aortic Atherosclerosis (ICD10-I70.0). Electronically Signed   By: Van Clines M.D.   On: 11/12/2021 18:42   CT Head Wo Contrast  Result Date: 11/12/2021 CLINICAL DATA:  Fall. EXAM: CT HEAD WITHOUT  CONTRAST CT CERVICAL SPINE WITHOUT CONTRAST TECHNIQUE: Multidetector CT imaging of the head and cervical spine was performed following the standard protocol without intravenous contrast. Multiplanar CT image reconstructions of the cervical spine were also generated. RADIATION DOSE REDUCTION: This exam was performed according to the departmental dose-optimization program which includes automated exposure control, adjustment of the mA and/or kV according to patient size and/or use of iterative reconstruction technique. COMPARISON:  None Available. FINDINGS: CT HEAD FINDINGS Brain: No evidence of acute infarction, hemorrhage, hydrocephalus, extra-axial collection or mass lesion/mass effect. There is moderate diffuse atrophy. There is mild periventricular white matter hypodensity, likely chronic small vessel ischemic change. Vascular: Atherosclerotic calcifications are present within the cavernous internal carotid arteries. Skull: Normal. Negative for fracture or focal lesion. Sinuses/Orbits: No acute finding. Other: None. CT CERVICAL SPINE FINDINGS Alignment: There is trace anterolisthesis at C6-C7 which is likely degenerative. Alignment is otherwise anatomic. Skull base and vertebrae: Bones are osteopenic. No acute fracture. No primary bone lesion or focal pathologic process. Soft tissues and spinal canal: No prevertebral fluid or swelling. No visible canal hematoma. Disc levels: There is severe disc space narrowing at C3-C4, C4-C5 and C5-C6. No significant central canal or neural foraminal stenosis at any level. Upper chest: Negative. Other: None. IMPRESSION: 1.  No acute intracranial process. 2. No acute fracture or traumatic subluxation of the cervical spine. 3. Moderate diffuse brain atrophy. Mild chronic small vessel ischemic change. 4.  Degenerative changes of the cervical spine as above. Electronically Signed   By: Ronney Asters M.D.   On: 11/12/2021 17:52   CT Cervical Spine Wo Contrast  Result Date:  11/12/2021 CLINICAL DATA:  Fall. EXAM: CT HEAD WITHOUT CONTRAST CT CERVICAL SPINE WITHOUT CONTRAST TECHNIQUE: Multidetector CT imaging of the head and cervical spine was performed following the standard protocol without intravenous contrast. Multiplanar CT image reconstructions of the cervical spine were also generated. RADIATION DOSE REDUCTION: This exam was performed according to  the departmental dose-optimization program which includes automated exposure control, adjustment of the mA and/or kV according to patient size and/or use of iterative reconstruction technique. COMPARISON:  None Available. FINDINGS: CT HEAD FINDINGS Brain: No evidence of acute infarction, hemorrhage, hydrocephalus, extra-axial collection or mass lesion/mass effect. There is moderate diffuse atrophy. There is mild periventricular white matter hypodensity, likely chronic small vessel ischemic change. Vascular: Atherosclerotic calcifications are present within the cavernous internal carotid arteries. Skull: Normal. Negative for fracture or focal lesion. Sinuses/Orbits: No acute finding. Other: None. CT CERVICAL SPINE FINDINGS Alignment: There is trace anterolisthesis at C6-C7 which is likely degenerative. Alignment is otherwise anatomic. Skull base and vertebrae: Bones are osteopenic. No acute fracture. No primary bone lesion or focal pathologic process. Soft tissues and spinal canal: No prevertebral fluid or swelling. No visible canal hematoma. Disc levels: There is severe disc space narrowing at C3-C4, C4-C5 and C5-C6. No significant central canal or neural foraminal stenosis at any level. Upper chest: Negative. Other: None. IMPRESSION: 1.  No acute intracranial process. 2. No acute fracture or traumatic subluxation of the cervical spine. 3. Moderate diffuse brain atrophy. Mild chronic small vessel ischemic change. 4.  Degenerative changes of the cervical spine as above. Electronically Signed   By: Ronney Asters M.D.   On: 11/12/2021 17:52    CT FEMUR LEFT WO CONTRAST  Result Date: 11/12/2021 CLINICAL DATA:  Trauma, fall, possible fracture. Unable to bear weight. EXAM: CT OF THE LOWER LEFT EXTREMITY WITHOUT CONTRAST TECHNIQUE: Multidetector CT imaging of the lower left extremity was performed according to the standard protocol. RADIATION DOSE REDUCTION: This exam was performed according to the departmental dose-optimization program which includes automated exposure control, adjustment of the mA and/or kV according to patient size and/or use of iterative reconstruction technique. COMPARISON:  Radiographs 08/28/2020 FINDINGS: Bones/Joint/Cartilage Streak artifact associated with the left total hip prosthesis left lateral buttress plate and screw fixator noted. Old healed left inferior pubic ramus fracture, image 72 series 2. Left femur proximal periprosthetic fracture involving the greater trochanter and anterolateral left proximal femoral metaphysis, with about 1.2 cm of cortical step-off anteriorly on image 74 series 4. Suspected vertical nondisplaced component of this fracture extending distally in the femoral shaft anteromedial to the stem of the prosthesis for example on image 126 of series 2. This becomes indistinct distally. There is a previous oblique fracture of the femoral diaphysis with healing response noted along the upper fracture margins for example on image 184 of series 2, traversed by the buttress plate and with cerclage is in the vicinity of the fracture. On image 219 of series 2, there is a 1.8 by 3.5 cm cortical defect in the anterior femoral shaft which is probably chronic. Osteoarthritis of the knee is present. Ligaments Suboptimally assessed by CT. Muscles and Tendons Unremarkable Soft tissues Indirect left inguinal hernia contains loop of bowel without strangulation or obstruction. Substantial atherosclerosis. 7.9 by 5.1 by 14.4 cm (volume = 300 cm^3) mixed density subcutaneous structure along the left buttock and upper thigh  region favoring hematoma. This partially abuts the superficial fascia margin. There is substantial surrounding subcutaneous edema along the buttock and tracking in the left lateral thigh. IMPRESSION: 1. 300 cc mixed density subcutaneous lesion along the left posterior buttock/upper thigh region compatible with hematoma. Cannot completely exclude Sherry Ruffing lesion as this abuts the superficial fascia margin. Surrounding subcutaneous edema noted. 2. Primarily vertically oriented fracture extending through the greater tuberosity and into the anterior cortex of the proximal femoral metaphysis, with  about 1.5 cm of step-off anteriorly, most compatible with a periprosthetic fracture. Suspected small nondisplaced vertical component extending distally in the femoral shaft anteromedially. 3. Remote mid to distal femoral fracture with buttress plate and screw fixation. Likely chronic anterior cortical defect in the distal femoral metaphysis. 4. Left inguinal hernia contains bowel. 5. Old left inferior pubic ramus fracture. Electronically Signed   By: Van Clines M.D.   On: 11/12/2021 18:06   DG Knee Complete 4 Views Left  Result Date: 11/12/2021 CLINICAL DATA:  Fall EXAM: LEFT KNEE - COMPLETE 4+ VIEW COMPARISON:  CT scan from 11/12/2021 FINDINGS: Bony demineralization. Distal femoral buttress plate laterally with cerclage fixation along the site of the prior oblique shaft fracture. Articular space narrowing in the knee especially in the medial compartment. Trace knee effusion. Substantial spurring in the patellofemoral joint. Distal SFA and popliteal artery atherosclerotic calcification. IMPRESSION: 1. Substantial osteoarthritis of the knee.  Small knee effusion. 2. Postoperative findings in the distal femur. 3. Atherosclerosis. Electronically Signed   By: Van Clines M.D.   On: 11/12/2021 18:40   DG Hip Unilat With Pelvis 2-3 Views Left  Result Date: 11/12/2021 CLINICAL DATA:  Fall. EXAM: DG HIP  (WITH OR WITHOUT PELVIS) 2-3V LEFT COMPARISON:  Left hip x-ray 08/28/2020 FINDINGS: Bones are diffusely osteopenic. Left hip arthroplasty is again seen in anatomic alignment. There is a lateral mid femoral sideplate, partially imaged. There is no evidence for hardware loosening. There is an acute oblique fracture through the lateral aspect of the proximal left femur just inferior to the intratrochanteric region. Fracture fragment is distracted 1 cm. There is a healed left inferior pubic ramus fracture. Right hip arthroplasty appears uncomplicated. IMPRESSION: 1. Acute fracture through the proximal left femur just inferior to the intratrochanteric region. 2. Left hip arthroplasty and femoral sideplate appear in anatomic alignment. Electronically Signed   By: Ronney Asters M.D.   On: 11/12/2021 18:41    Procedures Procedures    Medications Ordered in ED Medications  fentaNYL (SUBLIMAZE) injection 25 mcg (has no administration in time range)    ED Course/ Medical Decision Making/ A&P                           Medical Decision Making Amount and/or Complexity of Data Reviewed Independent Historian: parent and EMS Labs: ordered. Decision-making details documented in ED Course. Radiology: ordered and independent interpretation performed. Decision-making details documented in ED Course. ECG/medicine tests: ordered and independent interpretation performed. Decision-making details documented in ED Course.  Risk Prescription drug management. Decision regarding hospitalization.   Fall with hip injury.  Denies hitting head.  Vitals are stable, no distress he is anticoagulated on Coumadin for a valve replacement.  He has a large hematoma to his left buttock and is anticoagulated. Xray results reviewed and interpreted by me. There is a periprosthetic fracture of greater trochanter of femur.  D/w Dr. Kathaleen Bury on call for Dr. Maureen Ralphs. He states this will be nonsurgical. Medical admission, [facility],  monitoring hematoma to L buttock. Cannot reverse coumadin at this time due to mechanical valve.   Vitals remain stable. Admission d/w Dr. Myna Hidalgo.  CT hip pending for further evaluation of hematoma.        Final Clinical Impression(s) / ED Diagnoses Final diagnoses:  None    Rx / DC Orders ED Discharge Orders     None         Declyn Delsol, Annie Main, MD 11/13/21 [phone]

## 2021-11-13 ENCOUNTER — Telehealth: Payer: Self-pay

## 2021-11-13 DIAGNOSIS — L8990 Pressure ulcer of unspecified site, unspecified stage: Secondary | ICD-10-CM

## 2021-11-13 DIAGNOSIS — M978XXA Periprosthetic fracture around other internal prosthetic joint, initial encounter: Secondary | ICD-10-CM | POA: Diagnosis not present

## 2021-11-13 DIAGNOSIS — Z96649 Presence of unspecified artificial hip joint: Secondary | ICD-10-CM | POA: Diagnosis not present

## 2021-11-13 DIAGNOSIS — S7002XA Contusion of left hip, initial encounter: Secondary | ICD-10-CM

## 2021-11-13 DIAGNOSIS — E43 Unspecified severe protein-calorie malnutrition: Secondary | ICD-10-CM

## 2021-11-13 LAB — BASIC METABOLIC PANEL
Anion gap: 5 (ref 5–15)
BUN: 43 mg/dL — ABNORMAL HIGH (ref 8–23)
CO2: 26 mmol/L (ref 22–32)
Calcium: 8.2 mg/dL — ABNORMAL LOW (ref 8.9–10.3)
Chloride: 103 mmol/L (ref 98–111)
Creatinine, Ser: 1.6 mg/dL — ABNORMAL HIGH (ref 0.61–1.24)
GFR, Estimated: 40 mL/min — ABNORMAL LOW (ref >60)
Glucose, Bld: 97 mg/dL (ref 70–99)
Potassium: 3.8 mmol/L (ref 3.5–5.1)
Sodium: 134 mmol/L — ABNORMAL LOW (ref 135–145)

## 2021-11-13 LAB — SURGICAL PCR SCREEN
MRSA, PCR: NEGATIVE
Staphylococcus aureus: NEGATIVE

## 2021-11-13 LAB — CBC
HCT: 24.6 % — ABNORMAL LOW (ref 39.0–52.0)
Hemoglobin: 7.9 g/dL — ABNORMAL LOW (ref 13.0–17.0)
MCH: 32.1 pg (ref 26.0–34.0)
MCHC: 32.1 g/dL (ref 30.0–36.0)
MCV: 100 fL (ref 80.0–100.0)
Platelets: 147 10*3/uL — ABNORMAL LOW (ref 150–400)
RBC: 2.46 MIL/uL — ABNORMAL LOW (ref 4.22–5.81)
RDW: 18 % — ABNORMAL HIGH (ref 11.5–15.5)
WBC: 3 10*3/uL — ABNORMAL LOW (ref 4.0–10.5)
nRBC: 0 % (ref 0.0–0.2)

## 2021-11-13 LAB — MAGNESIUM: Magnesium: 2.3 mg/dL (ref 1.7–2.4)

## 2021-11-13 LAB — PROTIME-INR
INR: 3.3 — ABNORMAL HIGH (ref 0.8–1.2)
Prothrombin Time: 33.2 seconds — ABNORMAL HIGH (ref 11.4–15.2)

## 2021-11-13 LAB — HEMOGLOBIN AND HEMATOCRIT, BLOOD
HCT: 23.5 % — ABNORMAL LOW (ref 39.0–52.0)
HCT: 25 % — ABNORMAL LOW (ref 39.0–52.0)
Hemoglobin: 7.8 g/dL — ABNORMAL LOW (ref 13.0–17.0)
Hemoglobin: 8.1 g/dL — ABNORMAL LOW (ref 13.0–17.0)

## 2021-11-13 MED ORDER — WARFARIN SODIUM 5 MG PO TABS
5.0000 mg | ORAL_TABLET | Freq: Once | ORAL | Status: AC
  Administered 2021-11-13: 5 mg via ORAL
  Filled 2021-11-13: qty 1

## 2021-11-13 MED ORDER — WARFARIN - PHARMACIST DOSING INPATIENT
Freq: Every day | Status: DC
  Administered 2021-11-14: 1

## 2021-11-13 MED ORDER — ADULT MULTIVITAMIN W/MINERALS CH
1.0000 | ORAL_TABLET | Freq: Every day | ORAL | Status: DC
  Administered 2021-11-14 – 2021-11-17 (×4): 1 via ORAL
  Filled 2021-11-13 (×4): qty 1

## 2021-11-13 MED ORDER — ENSURE ENLIVE PO LIQD
237.0000 mL | Freq: Three times a day (TID) | ORAL | Status: DC
  Administered 2021-11-13 – 2021-11-17 (×10): 237 mL via ORAL

## 2021-11-13 MED ORDER — ACETAMINOPHEN 325 MG PO TABS
650.0000 mg | ORAL_TABLET | Freq: Four times a day (QID) | ORAL | Status: DC | PRN
  Administered 2021-11-13 – 2021-11-17 (×9): 650 mg via ORAL
  Filled 2021-11-13 (×9): qty 2

## 2021-11-13 NOTE — Progress Notes (Signed)
ANTICOAGULATION CONSULT NOTE - Initial Consult  Pharmacy Consult for warfarin Indication:  mechanical mitral valve  Allergies  Allergen Reactions   Flexeril [Cyclobenzaprine] Diarrhea    Patient Measurements: Height: 6' (182.9 cm) Weight: 63 kg (138 lb 14.2 oz) IBW/kg (Calculated) : 77.6 Heparin Dosing Weight: n/a  Vital Signs: Temp: 98.3 F (36.8 C) (05/19 1358) Temp Source: Oral (05/19 1358) BP: 109/46 (05/19 1358) Pulse Rate: 91 (05/19 1358)  Labs: Recent Labs    11/12/21 1707 11/13/21 0347 11/13/21 1104  HGB 10.0* 7.9* 7.8*  HCT 31.4* 24.6* 23.5*  PLT 161 147*  --   LABPROT 27.8* 33.2*  --   INR 2.6* 3.3*  --   CREATININE 2.05* 1.60*  --     Estimated Creatinine Clearance: 25.2 mL/min (A) (by C-G formula based on SCr of 1.6 mg/dL (H)).   Medical History: Past Medical History:  Diagnosis Date   Anemia    leakoppenia   BPH (benign prostatic hypertrophy)    Bullous pemphigoid    [name], March 2011, right forearm squamous cell carcinoma   Chronic anticoagulation    systemic   Colon polyp    transverse, 2002   History of peptic ulcer    remote, 3/95   Hx of actinic keratosis    Hx of basal cell carcinoma    Hx of squamous cell carcinoma of skin    Hyperlipidemia    Left inguinal hernia    Moderate aortic insufficiency 2009   audible aortic insufficiency on 1/09 echo   PAF (paroxysmal atrial fibrillation) ([place]) 01/17/2014   On Warfarin.   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 PMH significant for ORIF of distal left femur in March 2022, s/p partial  colectomy in Jan 2023. Patient has atrial fibrillation and a mechanical mitral valve for which he is chronically on warfarin. Pharmacy consulted to manage while inpatient.   Today, 11/13/21 INR = 3.3 is therapeutic CBC: Hgb dropped 10 > 7.8 MD noted, planning to transfuse if Hgb <7. Plt slightly low.  Noted suspected hematoma in L posterior buttock/upper thigh Diet: Regular; meal intake not charted No major DDI   Goal of Therapy:  INR 2.5 - 3.5  Plan:  Resume home dose. Warfarin 5 mg PO this evening INR daily. CBC with AM labs tomorrow. Monitor for signs of bleeding or worsening hematoma  Since patient has a mechanical valve - if INR becomes subtherapeutic, patient will need to be bridged with parenteral anticoagulation.   Lenis Noon, PharmD 11/13/2021,4:18 PM

## 2021-11-13 NOTE — Assessment & Plan Note (Signed)
Per Dr. Kathaleen Bury via ED note, plan for nonsurgical management Ortho recommending no surgical intervention, LLE 50% partial weightbearing with walker and 1-2 person assit at all times, no active abduction for at least 6 weeks CT L femur with 300 cc mixed density lesion along the L posterior buttock/upper thigh region compatible with hematoma (can't excluded morel lavallee lesion), vertically oriented fracture extending through greater tuberosity and into the anterior cortex of the proximal femoral metaphysis with about 1.5 cm of step off anteriorly, most c/w periprosthetic fracture (suspected small nondisplaed vertical component extending distally in the femoral shafter anteromedially).  Remote mid to distal femoral fx with buttress plate and screw fixation.  Likely chronic anterior cortical defect in the distal femoral metaphysis. F/u in 2 weeks with Dr. Lyla Glassing

## 2021-11-13 NOTE — Consult Note (Signed)
Orthopaedic Surgery Plan of Care Note  -reviewed history and imaging -d/w arthroplasty team who previously operated on [name] -left lower extremity: 50% weightbearing with walker, no active hip abduction for 6 weeks -follow up with Dr. Lyla Glassing in 2 weeks -full consult note to follow  Armond Hang, MD Orthopaedic Surgery [facility]

## 2021-11-13 NOTE — Progress Notes (Signed)
PROGRESS NOTE    Richard Spears  WEX:[id number] DOB: Mar 28, 1926 DOA: 11/12/2021 PCP: Richard Orn, MD  Chief Complaint  Patient presents with   Fall    Brief Narrative:   Richard Spears is Richard Spears pleasant 88 y.o. male with medical history significant for mechanical mitral valve and atrial fibrillation on warfarin, CKD stage IIIb, hypertension, ORIF of distal left femur in March 2022, and cancer of the transverse colon, now presenting to the emergency department with left hip pain after fall.  The patient reports that he was in his usual state when he was using the restroom, believes he became tangled in his pants and briefs, and fell onto his buttock.  He was experiencing pain immediately but initially refused transport to the ED until he was unable to bear weight later in the day.  He denies any recent chest pain or palpitations, denies cough or shortness of breath, and denies hitting his head or losing consciousness when he fell.   ED Course: Upon arrival to the ED, patient is found to be afebrile and saturating well on room air with stable blood pressure.  EKG features atrial fibrillation with QTc interval 539 ms.  CT of the left hip is suggestive of periprosthetic left femur fracture and left posterior buttock/upper thigh hematoma.  Orthopedic surgery was consulted by the ED physician and the patient was treated with fentanyl.      Assessment & Plan:   Principal Problem:   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, initial encounter Active Problems:   Hematoma   Traumatic hematoma of buttock   Anemia   H/O mitral valve replacement with mechanical valve   Longstanding persistent atrial fibrillation: CHA2DS2-VASc Score 3   Acute kidney injury superimposed on chronic kidney disease (HCC)   Essential hypertension   Prolonged QT interval   Protein-calorie malnutrition, severe (HCC)   Decubitus ulcer   Hematoma of left hip   Assessment and Plan: *  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, initial encounter Per Dr. Kathaleen Spears via ED note, plan for nonsurgical management Nonweightbearing to LLE Awaiting formal orthopedic recommendations CT L femur with 300 cc mixed density lesion along the L posterior buttock/upper thigh region compatible with hematoma (can't excluded morel lavallee lesion), vertically oriented fracture extending through greater tuberosity and into the anterior cortex of the proximal femoral metaphysis with about 1.5 cm of step off anteriorly, most c/w periprosthetic fracture (suspected small nondisplaed vertical component extending distally in the femoral shafter anteromedially).  Remote mid to distal femoral fx with buttress plate and screw fixation.  Likely chronic anterior cortical defect in the distal femoral metaphysis.      Hematoma Suspected hematoma in L posterior buttock/upper thigh Will plan to continue warfarin for now, heparin if INR subtherapeutic  Anemia To 7.9 this AM from ~10 Will continue to trend, transfuse as needed for <7 or symptomatic  H/O mitral valve replacement with mechanical valve S/p St. Jude Bileaflet mechanical valve replacement Continue anticoagulation, bridge if INR subtherapeutic  Longstanding persistent atrial fibrillation: CHA2DS2-VASc Score 3 Warfarin Bisoprolol   Acute kidney injury superimposed on chronic kidney disease (HCC) Baseline appears to be 1.7 Bumped to 2 at presentation Improved now to around baseline Will trend  Prolonged QT interval Repeat EKG and follow  Essential hypertension Bisoprolol/hydralazine (on hold with soft BP noted this AM - seems improved now, will follow)  Protein-calorie malnutrition, severe (Richard Spears) Noted RD c/s  Decubitus ulcer Pressure Injury 11/12/21 Coccyx Mid Stage 1 -  Intact  skin with non-blanchable redness of Richard Spears localized area usually over Richard Spears bony prominence. stage one with pink color (Active)  11/12/21 2100   Location: Coccyx  Location Orientation: Mid  Staging: Stage 1 -  Intact skin with non-blanchable redness of Richard Spears localized area usually over Richard Spears bony prominence.  Wound Description (Comments): stage one with pink color  Present on Admission: Yes         DVT prophylaxis: warfarin Code Status: DNR Family Communication: none Disposition:   Status is: Inpatient Remains inpatient appropriate because: need for orthopedic c/s   Consultants:  ortho  Procedures:  none  Antimicrobials:  Anti-infectives (From admission, onward)    None       Subjective: No new complaints Continued L hip pain   Objective: Vitals:   11/13/21 0555 11/13/21 0954 11/13/21 1100 11/13/21 1358  BP: (!) 100/50 (!) 92/54 (!) 103/39 (!) 109/46  Pulse: 62 69  91  Resp: '18 12 14 12  '$ Temp:  98.5 F (36.9 C) 98.4 F (36.9 C) 98.3 F (36.8 C)  TempSrc:  Oral  Oral  SpO2:  94% 96% 95%  Weight:      Height:        Intake/Output Summary (Last 24 hours) at 11/13/2021 1533 Last data filed at 11/13/2021 1504 Gross per 24 hour  Intake 1077.85 ml  Output 1175 ml  Net -97.15 ml   Filed Weights   11/12/21 1910  Weight: 63 kg    Examination:  General exam: Appears calm and comfortable  Respiratory system: unlabored Cardiovascular system: S1 & S2 heard, RRR.  Gastrointestinal system: Abdomen is nondistended, soft and nontender.  Central nervous system: Alert and oriented. No focal neurological deficits. Extremities: L thigh bruising   Data Reviewed: I have personally reviewed following labs and imaging studies  CBC: Recent Labs  Lab 11/12/21 1707 11/13/21 0347 11/13/21 1104  WBC 4.7 3.0*  --   NEUTROABS 3.4  --   --   HGB 10.0* 7.9* 7.8*  HCT 31.4* 24.6* 23.5*  MCV 100.3* 100.0  --   PLT 161 147*  --     Basic Metabolic Panel: Recent Labs  Lab 11/12/21 1707 11/13/21 0347  NA 136 134*  K 4.4 3.8  CL 101 103  CO2 28 26  GLUCOSE 127* 97  BUN 43* 43*  CREATININE 2.05* 1.60*   CALCIUM 9.3 8.2*  MG  --  2.3    GFR: Estimated Creatinine Clearance: 25.2 mL/min (Richard Spears) (by C-G formula based on SCr of 1.6 mg/dL (H)).  Liver Function Tests: No results for input(s): AST, ALT, ALKPHOS, BILITOT, PROT, ALBUMIN in the last 168 hours.  CBG: No results for input(s): GLUCAP in the last 168 hours.   Recent Results (from the past 240 hour(s))  Surgical pcr screen     Status: None   Collection Time: 11/13/21  1:29 AM   Specimen: Nasal Mucosa; Nasal Swab  Result Value Ref Range Status   MRSA, PCR NEGATIVE NEGATIVE Final   Staphylococcus aureus NEGATIVE NEGATIVE Final    Comment: (NOTE) The Xpert SA Assay (FDA approved for NASAL specimens in patients 77 years of age and older), is one component of Carolin Quang comprehensive surveillance program. It is not intended to diagnose infection nor to guide or monitor treatment. Performed at [facility], [place] [address] [place], [place] [address]          Radiology Studies: DG Chest 1 View  Result Date: 11/12/2021 CLINICAL DATA:  Fall. EXAM: CHEST  1 VIEW COMPARISON:  PET-CT 10/16/2021 FINDINGS: Cardiomegaly. Atherosclerotic calcification of the aortic arch. Prosthetic mitral valve. Prior median sternotomy. Hazy opacities along the mid lungs are probably from skin folds. Old healed right rib fractures. IMPRESSION: 1. Stable cardiomegaly. 2. Hazy densities along the mid lungs bilaterally, probably skin folds. 3.  Aortic Atherosclerosis (ICD10-I70.0). Electronically Signed   By: Van Clines M.D.   On: 11/12/2021 18:42   CT Head Wo Contrast  Result Date: 11/12/2021 CLINICAL DATA:  Fall. EXAM: CT HEAD WITHOUT CONTRAST CT CERVICAL SPINE WITHOUT CONTRAST TECHNIQUE: Multidetector CT imaging of the head and cervical spine was performed following the standard protocol without intravenous contrast. Multiplanar CT image reconstructions of the cervical spine were also generated. RADIATION DOSE REDUCTION: This exam was  performed according to the departmental dose-optimization program which includes automated exposure control, adjustment of the mA and/or kV according to patient size and/or use of iterative reconstruction technique. COMPARISON:  None Available. FINDINGS: CT HEAD FINDINGS Brain: No evidence of acute infarction, hemorrhage, hydrocephalus, extra-axial collection or mass lesion/mass effect. There is moderate diffuse atrophy. There is mild periventricular white matter hypodensity, likely chronic small vessel ischemic change. Vascular: Atherosclerotic calcifications are present within the cavernous internal carotid arteries. Skull: Normal. Negative for fracture or focal lesion. Sinuses/Orbits: No acute finding. Other: None. CT CERVICAL SPINE FINDINGS Alignment: There is trace anterolisthesis at C6-C7 which is likely degenerative. Alignment is otherwise anatomic. Skull base and vertebrae: Bones are osteopenic. No acute fracture. No primary bone lesion or focal pathologic process. Soft tissues and spinal canal: No prevertebral fluid or swelling. No visible canal hematoma. Disc levels: There is severe disc space narrowing at C3-C4, C4-C5 and C5-C6. No significant central canal or neural foraminal stenosis at any level. Upper chest: Negative. Other: None. IMPRESSION: 1.  No acute intracranial process. 2. No acute fracture or traumatic subluxation of the cervical spine. 3. Moderate diffuse brain atrophy. Mild chronic small vessel ischemic change. 4.  Degenerative changes of the cervical spine as above. Electronically Signed   By: Ronney Asters M.D.   On: 11/12/2021 17:52   CT Cervical Spine Wo Contrast  Result Date: 11/12/2021 CLINICAL DATA:  Fall. EXAM: CT HEAD WITHOUT CONTRAST CT CERVICAL SPINE WITHOUT CONTRAST TECHNIQUE: Multidetector CT imaging of the head and cervical spine was performed following the standard protocol without intravenous contrast. Multiplanar CT image reconstructions of the cervical spine were also  generated. RADIATION DOSE REDUCTION: This exam was performed according to the departmental dose-optimization program which includes automated exposure control, adjustment of the mA and/or kV according to patient size and/or use of iterative reconstruction technique. COMPARISON:  None Available. FINDINGS: CT HEAD FINDINGS Brain: No evidence of acute infarction, hemorrhage, hydrocephalus, extra-axial collection or mass lesion/mass effect. There is moderate diffuse atrophy. There is mild periventricular white matter hypodensity, likely chronic small vessel ischemic change. Vascular: Atherosclerotic calcifications are present within the cavernous internal carotid arteries. Skull: Normal. Negative for fracture or focal lesion. Sinuses/Orbits: No acute finding. Other: None. CT CERVICAL SPINE FINDINGS Alignment: There is trace anterolisthesis at C6-C7 which is likely degenerative. Alignment is otherwise anatomic. Skull base and vertebrae: Bones are osteopenic. No acute fracture. No primary bone lesion or focal pathologic process. Soft tissues and spinal canal: No prevertebral fluid or swelling. No visible canal hematoma. Disc levels: There is severe disc space narrowing at C3-C4, C4-C5 and C5-C6. No significant central canal or neural foraminal stenosis at any level. Upper chest: Negative. Other: None. IMPRESSION: 1.  No acute intracranial process. 2.  No acute fracture or traumatic subluxation of the cervical spine. 3. Moderate diffuse brain atrophy. Mild chronic small vessel ischemic change. 4.  Degenerative changes of the cervical spine as above. Electronically Signed   By: Ronney Asters M.D.   On: 11/12/2021 17:52   CT FEMUR LEFT WO CONTRAST  Result Date: 11/12/2021 CLINICAL DATA:  Trauma, fall, possible fracture. Unable to bear weight. EXAM: CT OF THE LOWER LEFT EXTREMITY WITHOUT CONTRAST TECHNIQUE: Multidetector CT imaging of the lower left extremity was performed according to the standard protocol. RADIATION DOSE  REDUCTION: This exam was performed according to the departmental dose-optimization program which includes automated exposure control, adjustment of the mA and/or kV according to patient size and/or use of iterative reconstruction technique. COMPARISON:  Radiographs 08/28/2020 FINDINGS: Bones/Joint/Cartilage Streak artifact associated with the left total hip prosthesis left lateral buttress plate and screw fixator noted. Old healed left inferior pubic ramus fracture, image 72 series 2. Left femur proximal periprosthetic fracture involving the greater trochanter and anterolateral left proximal femoral metaphysis, with about 1.2 cm of cortical step-off anteriorly on image 74 series 4. Suspected vertical nondisplaced component of this fracture extending distally in the femoral shaft anteromedial to the stem of the prosthesis for example on image 126 of series 2. This becomes indistinct distally. There is Allura Doepke previous oblique fracture of the femoral diaphysis with healing response noted along the upper fracture margins for example on image 184 of series 2, traversed by the buttress plate and with cerclage is in the vicinity of the fracture. On image 219 of series 2, there is Chrissa Meetze 1.8 by 3.5 cm cortical defect in the anterior femoral shaft which is probably chronic. Osteoarthritis of the knee is present. Ligaments Suboptimally assessed by CT. Muscles and Tendons Unremarkable Soft tissues Indirect left inguinal hernia contains loop of bowel without strangulation or obstruction. Substantial atherosclerosis. 7.9 by 5.1 by 14.4 cm (volume = 300 cm^3) mixed density subcutaneous structure along the left buttock and upper thigh region favoring hematoma. This partially abuts the superficial fascia margin. There is substantial surrounding subcutaneous edema along the buttock and tracking in the left lateral thigh. IMPRESSION: 1. 300 cc mixed density subcutaneous lesion along the left posterior buttock/upper thigh region compatible with  hematoma. Cannot completely exclude Sherry Ruffing lesion as this abuts the superficial fascia margin. Surrounding subcutaneous edema noted. 2. Primarily vertically oriented fracture extending through the greater tuberosity and into the anterior cortex of the proximal femoral metaphysis, with about 1.5 cm of step-off anteriorly, most compatible with Corah Willeford periprosthetic fracture. Suspected small nondisplaced vertical component extending distally in the femoral shaft anteromedially. 3. Remote mid to distal femoral fracture with buttress plate and screw fixation. Likely chronic anterior cortical defect in the distal femoral metaphysis. 4. Left inguinal hernia contains bowel. 5. Old left inferior pubic ramus fracture. Electronically Signed   By: Van Clines M.D.   On: 11/12/2021 18:06   DG Knee Complete 4 Views Left  Result Date: 11/12/2021 CLINICAL DATA:  Fall EXAM: LEFT KNEE - COMPLETE 4+ VIEW COMPARISON:  CT scan from 11/12/2021 FINDINGS: Bony demineralization. Distal femoral buttress plate laterally with cerclage fixation along the site of the prior oblique shaft fracture. Articular space narrowing in the knee especially in the medial compartment. Trace knee effusion. Substantial spurring in the patellofemoral joint. Distal SFA and popliteal artery atherosclerotic calcification. IMPRESSION: 1. Substantial osteoarthritis of the knee.  Small knee effusion. 2. Postoperative findings in the distal femur. 3. Atherosclerosis. Electronically Signed   By: Van Clines  M.D.   On: 11/12/2021 18:40   DG Hip Unilat With Pelvis 2-3 Views Left  Result Date: 11/12/2021 CLINICAL DATA:  Fall. EXAM: DG HIP (WITH OR WITHOUT PELVIS) 2-3V LEFT COMPARISON:  Left hip x-ray 08/28/2020 FINDINGS: Bones are diffusely osteopenic. Left hip arthroplasty is again seen in anatomic alignment. There is Virtie Bungert lateral mid femoral sideplate, partially imaged. There is no evidence for hardware loosening. There is an acute oblique fracture  through the lateral aspect of the proximal left femur just inferior to the intratrochanteric region. Fracture fragment is distracted 1 cm. There is Camesha Farooq healed left inferior pubic ramus fracture. Right hip arthroplasty appears uncomplicated. IMPRESSION: 1. Acute fracture through the proximal left femur just inferior to the intratrochanteric region. 2. Left hip arthroplasty and femoral sideplate appear in anatomic alignment. Electronically Signed   By: Ronney Asters M.D.   On: 11/12/2021 18:41        Scheduled Meds:  bisoprolol  5 mg Oral Daily   feeding supplement  237 mL Oral TID BM   hydrALAZINE  25 mg Oral Daily   isosorbide mononitrate  30 mg Oral Daily   multivitamin with minerals  1 tablet Oral Daily   Continuous Infusions:   LOS: 1 day    Time spent: over 30 min    Fayrene Helper, MD [facility]   To contact the attending provider between 7A-7P or the covering provider during after hours 7P-7A, please log into the web site [email or web address] and access using universal [place] password for that web site. If you do not have the password, please call the hospital operator.  11/13/2021, 3:33 PM

## 2021-11-13 NOTE — Assessment & Plan Note (Addendum)
Bisoprolol/hydralazine

## 2021-11-13 NOTE — Assessment & Plan Note (Addendum)
Repeat EKG pending and follow

## 2021-11-13 NOTE — Assessment & Plan Note (Signed)
Baseline appears to be 1.7 Bumped to 2 at presentation Improved now to below baseline Will trend

## 2021-11-13 NOTE — Progress Notes (Addendum)
Initial Nutrition Assessment  DOCUMENTATION CODES:   Severe malnutrition in context of chronic illness, Underweight  INTERVENTION:   -Ensure Plus High Protein po TID, each supplement provides 350 kcal and 20 grams of protein.   -Multivitamin with minerals daily  -Recommend Regular diet once diet is advanced given severe malnutrition  NUTRITION DIAGNOSIS:   Severe Malnutrition related to chronic illness, cancer and cancer related treatments as evidenced by energy intake < or equal to 75% for > or equal to 1 month, severe fat depletion, severe muscle depletion.  GOAL:   Patient will meet greater than or equal to 90% of their needs  MONITOR:   PO intake, Supplement acceptance, Diet advancement, Labs, Weight trends, I & O's, Skin  REASON FOR ASSESSMENT:   Consult Hip fracture protocol  ASSESSMENT:   88 y.o. male with medical history significant for mechanical mitral valve and atrial fibrillation on warfarin, CKD stage IIIb, hypertension, ORIF of distal left femur in March 2022, and cancer of the transverse colon, now presenting to the emergency department with left hip pain after fall.  Admitted for periprosthetic left femur fracture.  Patient currently NPO, pt states it is still being decided if he needs to have a procedure or not.   Pt states he has not been eating well. From [facility], is provided a dinner meal. For breakfast will eat cereal or some sausage. Likes Boost but sometimes doesn't time it out well so he doesn't drink it consistently. Wants vanilla supplements, will order vanilla Ensure.   Denies issues with chewing or swallowing.   Pt reports weight loss of 20 lbs over the past year. Per weight records, pt has lost 11 lbs since 04/28/21 (7% wt loss x 6.5 months, insignificant for time frame).   Medications reviewed.  Labs reviewed:  Low Na  NUTRITION - FOCUSED PHYSICAL EXAM:  Flowsheet Row Most Recent Value  Orbital Region Severe depletion  Upper Arm  Region Severe depletion  Thoracic and Lumbar Region Severe depletion  Buccal Region Moderate depletion  Temple Region Severe depletion  Clavicle Bone Region Severe depletion  Clavicle and Acromion Bone Region Severe depletion  Scapular Bone Region Severe depletion  Dorsal Hand Severe depletion  Patellar Region Severe depletion  Anterior Thigh Region Severe depletion  Posterior Calf Region Severe depletion  Edema (RD Assessment) None  Hair Reviewed  Eyes Reviewed  Mouth Reviewed  Skin Reviewed  [skin tears]  Nails Reviewed       Diet Order:   Diet Order             Diet NPO time specified Except for: Sips with Meds, Ice Chips  Diet effective midnight                   EDUCATION NEEDS:   No education needs have been identified at this time  Skin:  Skin Assessment: Skin Integrity Issues: Skin Integrity Issues:: Stage I, Other (Comment) Stage I: mid coccyx Other: skin tears: back, pretibial, left buttocks hematoma  Last BM:  5/17  Height:   Ht Readings from Last 1 Encounters:  11/12/21 6' (1.829 m)    Weight:   Wt Readings from Last 1 Encounters:  11/12/21 63 kg    BMI:  Body mass index is 18.84 kg/m.  Estimated Nutritional Needs:   Kcal:  2000-2200  Protein:  90-100g  Fluid:  2L/day   Clayton Bibles, MS, RD, LDN Inpatient Clinical Dietitian Contact information available via Amion

## 2021-11-13 NOTE — Assessment & Plan Note (Signed)
Noted °RD c/s °

## 2021-11-13 NOTE — Assessment & Plan Note (Addendum)
Suspected hematoma in L posterior buttock/upper thigh Appears relatively stable Ice to L thigh hematoma Will plan to continue warfarin for now, heparin if INR subtherapeutic

## 2021-11-13 NOTE — Consult Note (Addendum)
Patient ID: Richard Spears MRN: [id number] DOB/AGE: June 17, 1926 88 y.o.  Admit date: 11/12/2021  Admission Diagnoses:  Principal Problem:   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, initial encounter Active Problems:   H/O mitral valve replacement with mechanical valve   Longstanding persistent atrial fibrillation: CHA2DS2-VASc Score 3   Essential hypertension   Anemia   Hematoma   Acute kidney injury superimposed on chronic kidney disease (HCC)   Prolonged QT interval   Traumatic hematoma of buttock   Hematoma of left hip   Protein-calorie malnutrition, severe (HCC)   Decubitus ulcer   HPI: Ortho consult for left periprosthetic hip fracture sustained 11/12/21. PMH notable for mechanical mitral valve and atrial fibrillation on warfarin, CKD stage IIIb, hypertension, ORIF of distal left femur in March 2022, and cancer of the transverse colon. The patient underwent left primary THA by Dr. Wynelle Link. He did well from this but sustained a periprosthetic [place] C fracture requiring ORIF by Dr. Lyla Glassing in March 2022. This most recent periprosthetic fracture is his second in just over a year. The patient says that he fell from standing when using the restroom. He was brought to the [facility]  and admitted to the hospitalist service. He reports mild pain and is in good spirits.  Past Medical History: Past Medical History:  Diagnosis Date   Anemia    leakoppenia   BPH (benign prostatic hypertrophy)    Bullous pemphigoid    [name], March 2011, right forearm squamous cell carcinoma   Chronic anticoagulation    systemic   Colon polyp    transverse, 2002   History of peptic ulcer    remote, 3/95   Hx of actinic keratosis    Hx of basal cell carcinoma    Hx of squamous cell carcinoma of skin    Hyperlipidemia    Left inguinal hernia    Moderate aortic insufficiency 2009   audible aortic insufficiency on 1/09 echo   PAF (paroxysmal atrial  fibrillation) ([place]) 01/17/2014   On Warfarin.   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 Jude mechanical valve   ORIF FEMUR FRACTURE Left 08/28/2020   Procedure: OPEN REDUCTION INTERNAL FIXATION (ORIF) DISTAL FEMUR FRACTURE;  Surgeon: Rod Can, MD;  Location: [place];  Service: Orthopedics;  Laterality: Left;   PARTIAL COLECTOMY N/A 06/30/2021   Procedure: PARTIAL COLECTOMY;  Surgeon: Clovis Riley, MD;  Location: [place];  Service: General;  Laterality: N/A;   TOTAL HIP ARTHROPLASTY Right 10/12/2017   Procedure: RIGHT TOTAL HIP ARTHROPLASTY ANTERIOR APPROACH;  Surgeon: Gaynelle Arabian, MD;  Location: [facility];  Service: Orthopedics;  Laterality: Right;   TRANSTHORACIC ECHOCARDIOGRAM  12/2018   Unable to assess diastolic function because of A. fib. Normal RV  function, but moderately elevated RVSP.  Severe biatrial enlargement. S/P St Jude bileaflet mechanical MVR that appears to be functioning normally. Mitral valve regurgitation cannot assess due to mechanical valve shadowing. MV Mean grad: 7.0 mmHg MV Area (PHT): 3.38 cm (stable for valve).  Mild Ao Sclerosis, Mild-Mod AI   TRANSTHORACIC ECHOCARDIOGRAM  08/'17; 10/'18    a) Mild conc LVH. EF 55-60%. No RWMA. Mod AI. Mechanical MV prosthesis functioning  properly. LAD dilation.;; b)  EF 55-60%.  Mo AI.  Bileaflet Saint Jude mechanical MV with no paravalvular leak.  Severe LA dilation.  Minimally elevated PAP    Family History: Family History  Problem Relation Age of Onset   Hypertension Mother    Lung cancer Sister    COPD Brother    Cancer Brother    Other Sister        polio   Lupus Son     Social History: Social History   Socioeconomic History   Marital status: Married    Spouse name: Not on file   Number of children: 2   Years of education: Not on file   Highest education level: Not on file  Occupational History   Occupation: retired  Tobacco Use   Smoking status: Former    Packs/day: 1.00    Years: 13.00    Pack years: 13.00    Types: Cigarettes    Start date: 1947    Quit date: 1960    Years since quitting: 63.4   Smokeless tobacco: Never  Vaping Use   Vaping Use: Never used  Substance and Sexual Activity   Alcohol use: Yes    Alcohol/week: 0.0 standard drinks    Comment: 1-2 drinks per day   Drug use: Never   Sexual activity: Not on file  Other Topics Concern   Not on file  Social History Narrative   Patient lives at [facility], With his wife - Meryl Crutch.   Social Determinants of Health   Financial Resource Strain: Not on file  Food Insecurity: Not on file  Transportation Needs: Not on file  Physical Activity: Not on file  Stress: Not on file  Social Connections: Not on file  Intimate Partner Violence: Not on file    Allergies: Flexeril [cyclobenzaprine]  Medications: I have reviewed the patient's current medications.  Vital Signs: Patient Vitals for the past 24 hrs:  BP Temp Temp src Pulse Resp SpO2  11/13/21 1851 (!) 92/55 98.3 F (36.8 C) Oral (!) 112 15 94 %  11/13/21 1752 (!) 99/39 98.4 F (36.9 C) Oral (!) 102 15 95 %  11/13/21 1358 (!) 109/46 98.3 F (36.8 C) Oral 91 12 95 %  11/13/21 1100 (!) 103/39 98.4 F (36.9 C) -- -- 14 96 %  11/13/21 0954 (!) 92/54 98.5 F (36.9 C)  Oral 69 12 94 %  11/13/21 0555 (!) 100/50 -- -- 62 18 --  11/13/21 0549 (!) 98/57 98.2 F (36.8 C) -- 61 17 95 %  11/13/21 0246 (!) 105/55 -- -- 65 18 --  11/13/21 0231 (!) 90/57 98.7 F (37.1 C) Oral 64 16 95 %    Radiology: DG Chest 1 View  Result Date: 11/12/2021 CLINICAL DATA:  Fall. EXAM: CHEST  1 VIEW COMPARISON:  PET-CT 10/16/2021 FINDINGS: Cardiomegaly. Atherosclerotic calcification of the aortic arch. Prosthetic mitral valve. Prior median sternotomy. Hazy opacities along the mid lungs are probably from skin folds. Old healed right rib fractures. IMPRESSION: 1. Stable cardiomegaly. 2. Hazy densities along the  mid lungs bilaterally, probably skin folds. 3.  Aortic Atherosclerosis (ICD10-I70.0). Electronically Signed   By: Van Clines M.D.   On: 11/12/2021 18:42   CT Head Wo Contrast  Result Date: 11/12/2021 CLINICAL DATA:  Fall. EXAM: CT HEAD WITHOUT CONTRAST CT CERVICAL SPINE WITHOUT CONTRAST TECHNIQUE: Multidetector CT imaging of the head and cervical spine was performed following the standard protocol without intravenous contrast. Multiplanar CT image reconstructions of the cervical spine were also generated. RADIATION DOSE REDUCTION: This exam was performed according to the departmental dose-optimization program which includes automated exposure control, adjustment of the mA and/or kV according to patient size and/or use of iterative reconstruction technique. COMPARISON:  None Available. FINDINGS: CT HEAD FINDINGS Brain: No evidence of acute infarction, hemorrhage, hydrocephalus, extra-axial collection or mass lesion/mass effect. There is moderate diffuse atrophy. There is mild periventricular white matter hypodensity, likely chronic small vessel ischemic change. Vascular: Atherosclerotic calcifications are present within the cavernous internal carotid arteries. Skull: Normal. Negative for fracture or focal lesion. Sinuses/Orbits: No acute finding. Other: None. CT CERVICAL SPINE  FINDINGS Alignment: There is trace anterolisthesis at C6-C7 which is likely degenerative. Alignment is otherwise anatomic. Skull base and vertebrae: Bones are osteopenic. No acute fracture. No primary bone lesion or focal pathologic process. Soft tissues and spinal canal: No prevertebral fluid or swelling. No visible canal hematoma. Disc levels: There is severe disc space narrowing at C3-C4, C4-C5 and C5-C6. No significant central canal or neural foraminal stenosis at any level. Upper chest: Negative. Other: None. IMPRESSION: 1.  No acute intracranial process. 2. No acute fracture or traumatic subluxation of the cervical spine. 3. Moderate diffuse brain atrophy. Mild chronic small vessel ischemic change. 4.  Degenerative changes of the cervical spine as above. Electronically Signed   By: Ronney Asters M.D.   On: 11/12/2021 17:52   CT Cervical Spine Wo Contrast  Result Date: 11/12/2021 CLINICAL DATA:  Fall. EXAM: CT HEAD WITHOUT CONTRAST CT CERVICAL SPINE WITHOUT CONTRAST TECHNIQUE: Multidetector CT imaging of the head and cervical spine was performed following the standard protocol without intravenous contrast. Multiplanar CT image reconstructions of the cervical spine were also generated. RADIATION DOSE REDUCTION: This exam was performed according to the departmental dose-optimization program which includes automated exposure control, adjustment of the mA and/or kV according to patient size and/or use of iterative reconstruction technique. COMPARISON:  None Available. FINDINGS: CT HEAD FINDINGS Brain: No evidence of acute infarction, hemorrhage, hydrocephalus, extra-axial collection or mass lesion/mass effect. There is moderate diffuse atrophy. There is mild periventricular white matter hypodensity, likely chronic small vessel ischemic change. Vascular: Atherosclerotic calcifications are present within the cavernous internal carotid arteries. Skull: Normal. Negative for fracture or focal lesion. Sinuses/Orbits:  No acute finding. Other: None. CT CERVICAL SPINE FINDINGS Alignment: There is trace anterolisthesis at C6-C7 which is likely degenerative. Alignment is otherwise anatomic. Skull base and vertebrae: Bones are osteopenic. No acute fracture. No primary bone lesion or focal pathologic process. Soft tissues and spinal canal: No prevertebral fluid or swelling. No visible canal hematoma. Disc levels: There is severe disc space narrowing at C3-C4, C4-C5 and C5-C6. No significant central canal or neural foraminal stenosis at any level. Upper chest: Negative. Other: None. IMPRESSION: 1.  No acute intracranial process. 2. No acute fracture or traumatic subluxation of the cervical spine. 3. Moderate diffuse brain atrophy. Mild chronic small vessel ischemic change. 4.  Degenerative changes of the cervical spine as above. Electronically Signed   By: Ronney Asters M.D.   On: 11/12/2021 17:52  CT FEMUR LEFT WO CONTRAST  Result Date: 11/12/2021 CLINICAL DATA:  Trauma, fall, possible fracture. Unable to bear weight. EXAM: CT OF THE LOWER LEFT EXTREMITY WITHOUT CONTRAST TECHNIQUE: Multidetector CT imaging of the lower left extremity was performed according to the standard protocol. RADIATION DOSE REDUCTION: This exam was performed according to the departmental dose-optimization program which includes automated exposure control, adjustment of the mA and/or kV according to patient size and/or use of iterative reconstruction technique. COMPARISON:  Radiographs 08/28/2020 FINDINGS: Bones/Joint/Cartilage Streak artifact associated with the left total hip prosthesis left lateral buttress plate and screw fixator noted. Old healed left inferior pubic ramus fracture, image 72 series 2. Left femur proximal periprosthetic fracture involving the greater trochanter and anterolateral left proximal femoral metaphysis, with about 1.2 cm of cortical step-off anteriorly on image 74 series 4. Suspected vertical nondisplaced component of this  fracture extending distally in the femoral shaft anteromedial to the stem of the prosthesis for example on image 126 of series 2. This becomes indistinct distally. There is a previous oblique fracture of the femoral diaphysis with healing response noted along the upper fracture margins for example on image 184 of series 2, traversed by the buttress plate and with cerclage is in the vicinity of the fracture. On image 219 of series 2, there is a 1.8 by 3.5 cm cortical defect in the anterior femoral shaft which is probably chronic. Osteoarthritis of the knee is present. Ligaments Suboptimally assessed by CT. Muscles and Tendons Unremarkable Soft tissues Indirect left inguinal hernia contains loop of bowel without strangulation or obstruction. Substantial atherosclerosis. 7.9 by 5.1 by 14.4 cm (volume = 300 cm^3) mixed density subcutaneous structure along the left buttock and upper thigh region favoring hematoma. This partially abuts the superficial fascia margin. There is substantial surrounding subcutaneous edema along the buttock and tracking in the left lateral thigh. IMPRESSION: 1. 300 cc mixed density subcutaneous lesion along the left posterior buttock/upper thigh region compatible with hematoma. Cannot completely exclude Sherry Ruffing lesion as this abuts the superficial fascia margin. Surrounding subcutaneous edema noted. 2. Primarily vertically oriented fracture extending through the greater tuberosity and into the anterior cortex of the proximal femoral metaphysis, with about 1.5 cm of step-off anteriorly, most compatible with a periprosthetic fracture. Suspected small nondisplaced vertical component extending distally in the femoral shaft anteromedially. 3. Remote mid to distal femoral fracture with buttress plate and screw fixation. Likely chronic anterior cortical defect in the distal femoral metaphysis. 4. Left inguinal hernia contains bowel. 5. Old left inferior pubic ramus fracture. Electronically  Signed   By: Van Clines M.D.   On: 11/12/2021 18:06   NM PET Image Initial (PI) Skull Base To Thigh  Result Date: 10/17/2021 CLINICAL DATA:  Subsequent treatment strategy for stage IV colon cancer. Partial colectomy 06/30/2021 with findings on recent CT suspicious for anastomotic recurrence and nodal metastases. EXAM: NUCLEAR MEDICINE PET SKULL BASE TO THIGH TECHNIQUE: 6.95 mCi F-18 FDG was injected intravenously. Full-ring PET imaging was performed from the skull base to thigh after the radiotracer. CT data was obtained and used for attenuation correction and anatomic localization. Fasting blood glucose: 89 mg/dl COMPARISON:  Abdominopelvic CT 09/17/2021 and 06/24/2021. FINDINGS: Mediastinal blood pool activity: SUV max Liver activity: SUV max NA NECK: No hypermetabolic cervical lymph nodes are identified.There are no lesions of the pharyngeal mucosal space. Incidental CT findings: Bilateral carotid atherosclerosis. CHEST: There is low-level hypermetabolic activity within several hilar lymph nodes bilaterally. The most hypermetabolic of these nodes have an SUV max  of 4.1 on the right and 3.2 on the left. No hypermetabolic mediastinal or axillary adenopathy. No hypermetabolic pulmonary activity or suspicious nodularity. Incidental CT findings: Diffuse atherosclerosis of the aorta, great vessels and coronary arteries status post median sternotomy and mitral valve replacement. The heart is enlarged. ABDOMEN/PELVIS: In correlation with recent prior CT, there is no hypermetabolic activity within the liver, adrenal glands, spleen or pancreas. The multiple enlarged lymph nodes in the upper abdomen are hypermetabolic. Large conglomerate mass in the gastrohepatic ligament appears mildly progressive over the last 4 weeks. A 5.1 x 4.3 cm component on image 118/4 has an SUV max of 21.6. Right periaortic node at the level of the renal hilum has a short axis dimension of 2.1 cm and an SUV max of 26.5. No  hypermetabolic lymph nodes are seen within the pelvis. There is no definite hypermetabolic activity associated with the colonic anastomosis. Incidental CT findings: Aortic and branch vessel atherosclerosis. Scattered contrast material in the bowel related to previous CT. Grossly stable left inguinal hernia containing bowel, without evidence of obstruction. SKELETON: There is no hypermetabolic activity to suggest osseous metastatic disease. There are compression deformities at T10 and T11 associated with mild hypermetabolic activity (SUV max 3.4). Hypermetabolic activity extends into the spinous process of T9. These fractures appear new compared with previous chest CT 07/02/2021. Incidental CT findings: Multilevel lumbar spondylosis. Previous bilateral total hip arthroplasty. IMPRESSION: 1. Intense multifocal hypermetabolic nodal metastases within the gastrohepatic ligament and upper retroperitoneum. This nodal disease appears mildly progressive compared with CT of 4 weeks ago. 2. No definite hypermetabolic activity at the colonic anastomosis as suggested on previous CT. 3. No hepatic or other definite distant metastases identified. Low-level hilar activity bilaterally is nonspecific, but likely reactive. 4. Recent compression deformities at T10 and T11 with mild hypermetabolic activity. Electronically Signed   By: Richardean Sale M.D.   On: 10/17/2021 10:41   DG Knee Complete 4 Views Left  Result Date: 11/12/2021 CLINICAL DATA:  Fall EXAM: LEFT KNEE - COMPLETE 4+ VIEW COMPARISON:  CT scan from 11/12/2021 FINDINGS: Bony demineralization. Distal femoral buttress plate laterally with cerclage fixation along the site of the prior oblique shaft fracture. Articular space narrowing in the knee especially in the medial compartment. Trace knee effusion. Substantial spurring in the patellofemoral joint. Distal SFA and popliteal artery atherosclerotic calcification. IMPRESSION: 1. Substantial osteoarthritis of the knee.   Small knee effusion. 2. Postoperative findings in the distal femur. 3. Atherosclerosis. Electronically Signed   By: Van Clines M.D.   On: 11/12/2021 18:40   DG Hip Unilat With Pelvis 2-3 Views Left  Result Date: 11/12/2021 CLINICAL DATA:  Fall. EXAM: DG HIP (WITH OR WITHOUT PELVIS) 2-3V LEFT COMPARISON:  Left hip x-ray 08/28/2020 FINDINGS: Bones are diffusely osteopenic. Left hip arthroplasty is again seen in anatomic alignment. There is a lateral mid femoral sideplate, partially imaged. There is no evidence for hardware loosening. There is an acute oblique fracture through the lateral aspect of the proximal left femur just inferior to the intratrochanteric region. Fracture fragment is distracted 1 cm. There is a healed left inferior pubic ramus fracture. Right hip arthroplasty appears uncomplicated. IMPRESSION: 1. Acute fracture through the proximal left femur just inferior to the intratrochanteric region. 2. Left hip arthroplasty and femoral sideplate appear in anatomic alignment. Electronically Signed   By: Ronney Asters M.D.   On: 11/12/2021 18:41    Labs: Recent Labs    11/12/21 1707 11/13/21 0347 11/13/21 1104 11/13/21 1722  WBC 4.7  3.0*  --   --   RBC 3.13* 2.46*  --   --   HCT 31.4* 24.6* 23.5* 25.0*  PLT 161 147*  --   --    Recent Labs    11/12/21 1707 11/13/21 0347  NA 136 134*  K 4.4 3.8  CL 101 103  CO2 28 26  BUN 43* 43*  CREATININE 2.05* 1.60*  GLUCOSE 127* 97  CALCIUM 9.3 8.2*   Recent Labs    11/12/21 1707 11/13/21 0347  INR 2.6* 3.3*    Review of Systems: ROS as detailed in HPI  Physical Exam: Body mass index is 18.84 kg/m.  Gen: AAOx3, NAD Comfortable at rest  Left Lower Extremity: Skin intact Large hematoma over lateral thigh that is soft and compressible All compartments soft and compressible No pain with passive stretch TTP over left greater trochanter and over adjacent hematoma HF/KE/KF/ADF/APF/EHL 5/5 SILT throughout DP, PT 2+ to  palp CR < 2s   Assessment and Plan: Ortho consult for left periprosthetic femur fracture (DOI 11/12/21) with PMH mechanical mitral valve and atrial fibrillation on warfarin, CKD stage IIIb, hypertension, ORIF of distal left femur in March 2022, and cancer of the transverse colon  -history, exam and imaging reviewed at length with the patient -reviewed CT and discussed plan with arthroplasty colleagues who have taken care of the patient previously -no acute surgical intervention at this time -left lower extremity 50% partial weightbearing with walker and 1-2 person assist at all times. No active abduction for at least 6 weeks -VTE ppx per primary team. Apply ice to left thigh hematoma and monitor clinically -follow up with Dr. Rod Can in 2 weeks   Armond Hang, MD Orthopaedic Surgeon [facility] [phone]

## 2021-11-13 NOTE — TOC Initial Note (Signed)
Transition of Care [facility]) - Initial/Assessment Note   Patient Details  Name: Richard Spears MRN: [id number] Date of Birth: [birth year]  Transition of [facility]) CM/SW Contact:    Sherie Don, LCSW Phone Number: 11/13/2021, 12:20 PM  Clinical Narrative: CSW spoke with patient and confirmed patient is from [place]. Per patient, he is not sure if he will have surgery or not. CSW explained that the discharge plan/recommendations will depend on the orthopedic plan and how patient does with PT. TOC to follow.  Expected Discharge Plan: Home/Self Care Barriers to Discharge: Continued Medical Work up  Patient Goals and CMS Choice Patient states their goals for this hospitalization and ongoing recovery are:: Return to ILF if possible  Expected Discharge Plan and Services Expected Discharge Plan: Home/Self Care In-house Referral: Clinical Social Work Living arrangements for the past 2 months: [place]  Prior Living Arrangements/Services Living arrangements for the past 2 months: [place] Lives with:: Spouse Patient language and need for interpreter reviewed:: Yes Do you feel safe going back to the place where you live?: Yes      Need for Family Participation in Patient Care: No (Comment) Care giver support system in place?: Yes (comment) Criminal Activity/Legal Involvement Pertinent to Current Situation/Hospitalization: No - Comment as needed  Activities of Daily Living Home Assistive Devices/Equipment: Environmental consultant (specify type), Eyeglasses ADL Screening (condition at time of admission) Patient's cognitive ability adequate to safely complete daily activities?: Yes Is the patient deaf or have difficulty hearing?: No Does the patient have difficulty seeing, even when wearing glasses/contacts?: No Does the patient have difficulty concentrating, remembering, or making decisions?: Yes Patient able to express need for assistance with ADLs?:  Yes Does the patient have difficulty dressing or bathing?: Yes Independently performs ADLs?: No Communication: Independent Dressing (OT): Needs assistance Is this a change from baseline?: Change from baseline, expected to last >3 days Grooming: Independent Feeding: Independent Bathing: Needs assistance Is this a change from baseline?: Change from baseline, expected to last >3 days Toileting: Needs assistance Is this a change from baseline?: Change from baseline, expected to last >3days In/Out Bed: Needs assistance Is this a change from baseline?: Change from baseline, expected to last >3 days Walks in Home: Needs assistance (usually walks independently with a walker) Is this a change from baseline?: Change from baseline, expected to last >3 days Does the patient have difficulty walking or climbing stairs?: Yes Weakness of Legs: Left Weakness of Arms/Hands: None (cut on let arm during fall)  Emotional Assessment Attitude/Demeanor/Rapport: Engaged Affect (typically observed): Accepting Orientation: : Oriented to Self, Oriented to Place, Oriented to  Time, Oriented to Situation Alcohol / Substance Use: Not Applicable Psych Involvement: No (comment)  Admission diagnosis:  Fall, initial encounter [W19.XXXA] Hematoma of left hip, initial encounter [S70.02XA] Closed fracture of proximal end of left femur, initial encounter ([place]) [S72.002A] 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, initial encounter [O53.8XXA, [place] Patient Active Problem List   Diagnosis Date Noted   Hematoma of left hip    Closed fracture of proximal end of left femur, initial encounter ([place]) 11/12/2021   Prolonged QT interval 11/12/2021   Traumatic hematoma of buttock 11/12/2021   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, initial encounter 11/12/2021   Superficial vein thrombosis 07/15/2021   Cancer of transverse colon  07/03/2021   Acute blood loss anemia 02/03/2021    CKD (chronic kidney disease) stage 3, GFR 30-59 ml/min (HCC) 12/23/2020   Pressure ulcer of right buttock,  stage 2 ([place]) 10/07/2020   Weight loss 09/23/2020   Insomnia 09/23/2020   Dysuria 09/23/2020   BPH (benign prostatic hyperplasia) 09/03/2020   Generalized osteoarthritis of multiple sites 09/03/2020   Slow transit constipation 08/30/2020   AKI (acute kidney injury) ([place]) 08/27/2020   Closed displaced fracture of distal epiphysis of left femur ([place]) 08/25/2020   Macrocytic anemia 08/25/2020   Fall 08/25/2020   Alcohol use 08/25/2020   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   Hip fracture ([place]) 10/09/2017   Essential hypertension 05/12/2017   Exertional dyspnea 01/18/2016   Venous insufficiency 02/14/2015   Cancer of skin of right lower leg 10/31/2014   AI (aortic insufficiency) 01/23/2014   Longstanding persistent atrial fibrillation: CHA2DS2-VASc Score 3 01/17/2014   Hyperlipidemia    S/P MVR (mitral valve replacement) 04/08/1996   PCP:  [name], MD Pharmacy:   [id number]: [phone]: [phone] Readmission Risk Interventions    11/13/2021   12:17 PM  Readmission Risk Prevention Plan  [place]  Palliative Care Screening Not Applicable

## 2021-11-13 NOTE — Assessment & Plan Note (Signed)
Warfarin Bisoprolol

## 2021-11-13 NOTE — Assessment & Plan Note (Addendum)
S/p St. Jude Bileaflet mechanical valve replacement Continue anticoagulation (INR goal 2.5-3.5) with warfarin Follow INR outpatient

## 2021-11-13 NOTE — Assessment & Plan Note (Addendum)
Pressure Injury 11/12/21 Coccyx Mid Stage 1 -  Intact skin with non-blanchable redness of Tinsleigh Slovacek localized area usually over Anil Havard bony prominence. stage one with pink color (Active)  11/12/21 2100  Location: Coccyx  Location Orientation: Mid  Staging: Stage 1 -  Intact skin with non-blanchable redness of Kentaro Alewine localized area usually over Jonette Wassel bony prominence.  Wound Description (Comments): stage one with pink color  Present on Admission: Yes

## 2021-11-13 NOTE — Telephone Encounter (Signed)
Spoke with pt's son Richard Spears via telephone regarding pt's recent hospitalization and upcoming appts.  Richard Spears wanted to know the plans of the pt's care now since the pt is in the hospital d/t a recent fall on 11/12/2021.  Richard Spears wanted to know if the pt will still get Pembrolizumab while in the hospital.  Informed Richard Spears that the pt will not get Pembrolizumab while in the hospital d/t Dr. Burr Medico would like for the pt to recover from his fall before given him more immunotherapy.  Richard Spears also wanted to know if they could keep the appt with Dr. Burr Medico that is scheduled on 11/17/2021.  Spoke with Dr. Burr Medico and she stated "Yes" they could keep the appt scheduled on 11/17/2021 but the visit will be a telemedicine visit.  Dr. Burr Medico ordered this RN to send a message to scheduling to reschedule the 10:20am appt on 11/17/2021 to a telephone visit.  Scheduling message was sent by this RN on 11/13/2021.  Informed Richard Spears that the appt on 11/17/2021 for Dr. Burr Medico will be a telephone visit with he and the pt.  Informed Richard Spears that the pt must be present at the time of the visit.  Richard Spears stated he will make sure the patient is on the phone at the time of the telemedicine visit.  Informed Richard Spears that the pt's f/u appt scheduled on 12/11/2021 with Wilber Bihari, NP the pt may or may not get the Pembrolizumab infusion at that time give or take how the pt is recovering at that time.  Richard Spears verbalized understanding and had no further questions or concerns at this time.

## 2021-11-13 NOTE — Hospital Course (Addendum)
Richard Spears is Richard Spears pleasant 88 y.o. male with medical history significant for mechanical mitral valve and atrial fibrillation on warfarin, CKD stage IIIb, hypertension, ORIF of distal left femur in March 2022, and cancer of the transverse colon, now presenting to the emergency department with left hip pain after fall.  CT of the left hip is suggestive of periprosthetic left femur fracture and left posterior buttock/upper thigh hematoma.  Orhtopedics is recommending 50% weightbearing to left lower extremity with walker and 1-2 person assist at all times, no active abduction for 6 weeks.  He was continued on his warfarin.  He was transfused 1 unit of pRBC.  He's continued to improve and was discharged with plans for outpatient follow up on 5/23.    See below for additional details

## 2021-11-13 NOTE — Assessment & Plan Note (Addendum)
Transfused 1 unit pRBC 5/20 Hb today 8.2 today, stable from yesterday Start iron Continue to trend and transfuse for <8 or symptomatic (per oncology)

## 2021-11-14 DIAGNOSIS — M978XXA Periprosthetic fracture around other internal prosthetic joint, initial encounter: Secondary | ICD-10-CM | POA: Diagnosis not present

## 2021-11-14 DIAGNOSIS — Z96649 Presence of unspecified artificial hip joint: Secondary | ICD-10-CM | POA: Diagnosis not present

## 2021-11-14 LAB — CBC
HCT: 22.2 % — ABNORMAL LOW (ref 39.0–52.0)
Hemoglobin: 7.2 g/dL — ABNORMAL LOW (ref 13.0–17.0)
MCH: 32.4 pg (ref 26.0–34.0)
MCHC: 32.4 g/dL (ref 30.0–36.0)
MCV: 100 fL (ref 80.0–100.0)
Platelets: 134 10*3/uL — ABNORMAL LOW (ref 150–400)
RBC: 2.22 MIL/uL — ABNORMAL LOW (ref 4.22–5.81)
RDW: 18.3 % — ABNORMAL HIGH (ref 11.5–15.5)
WBC: 3 10*3/uL — ABNORMAL LOW (ref 4.0–10.5)
nRBC: 0 % (ref 0.0–0.2)

## 2021-11-14 LAB — BASIC METABOLIC PANEL
Anion gap: 5 (ref 5–15)
BUN: 34 mg/dL — ABNORMAL HIGH (ref 8–23)
CO2: 26 mmol/L (ref 22–32)
Calcium: 8.4 mg/dL — ABNORMAL LOW (ref 8.9–10.3)
Chloride: 108 mmol/L (ref 98–111)
Creatinine, Ser: 1.35 mg/dL — ABNORMAL HIGH (ref 0.61–1.24)
GFR, Estimated: 49 mL/min — ABNORMAL LOW (ref >60)
Glucose, Bld: 96 mg/dL (ref 70–99)
Potassium: 4.1 mmol/L (ref 3.5–5.1)
Sodium: 139 mmol/L (ref 135–145)

## 2021-11-14 LAB — HEMOGLOBIN AND HEMATOCRIT, BLOOD
HCT: 22.7 % — ABNORMAL LOW (ref 39.0–52.0)
Hemoglobin: 7.4 g/dL — ABNORMAL LOW (ref 13.0–17.0)

## 2021-11-14 LAB — PREPARE RBC (CROSSMATCH)

## 2021-11-14 LAB — PROTIME-INR
INR: 2.9 — ABNORMAL HIGH (ref 0.8–1.2)
Prothrombin Time: 30.1 seconds — ABNORMAL HIGH (ref 11.4–15.2)

## 2021-11-14 MED ORDER — SODIUM CHLORIDE 0.9% IV SOLUTION: Freq: Once | INTRAVENOUS | Status: AC

## 2021-11-14 MED ORDER — WARFARIN SODIUM 2.5 MG PO TABS
2.5000 mg | ORAL_TABLET | Freq: Once | ORAL | Status: AC
  Administered 2021-11-14: 2.5 mg via ORAL
  Filled 2021-11-14: qty 1

## 2021-11-14 NOTE — Progress Notes (Signed)
PROGRESS NOTE    [name]  UXN:[id number] DOB: 18-Apr-1926 DOA: 11/12/2021 PCP: [name], MD  Chief Complaint  Patient presents with   Fall    Brief Narrative:   Richard Spears is Richard Spears pleasant 88 y.o. male with medical history significant for mechanical mitral valve and atrial fibrillation on warfarin, CKD stage IIIb, hypertension, ORIF of distal left femur in March 2022, and cancer of the transverse colon, now presenting to the emergency department with left hip pain after fall.  The patient reports that he was in his usual state when he was using the restroom, believes he became tangled in his pants and briefs, and fell onto his buttock.  He was experiencing pain immediately but initially refused transport to the ED until he was unable to bear weight later in the day.  He denies any recent chest pain or palpitations, denies cough or shortness of breath, and denies hitting his head or losing consciousness when he fell.   ED Course: Upon arrival to the ED, patient is found to be afebrile and saturating well on room air with stable blood pressure.  EKG features atrial fibrillation with QTc interval 539 ms.  CT of the left hip is suggestive of periprosthetic left femur fracture and left posterior buttock/upper thigh hematoma.  Orthopedic surgery was consulted by the ED physician and the patient was treated with fentanyl.      Assessment & Plan:   Principal Problem:   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, initial encounter Active Problems:   Hematoma   Traumatic hematoma of buttock   Anemia   H/O mitral valve replacement with mechanical valve   Longstanding persistent atrial fibrillation: CHA2DS2-VASc Score 3   Acute kidney injury superimposed on chronic kidney disease (HCC)   Essential hypertension   Prolonged QT interval   Protein-calorie malnutrition, severe (HCC)   Decubitus ulcer   Hematoma of left hip   Assessment and Plan: *  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, initial encounter Per Dr. Kathaleen Bury via ED note, plan for nonsurgical management Ortho recommending no surgical intervention, LLE 50% partial weightbearing with walker and 1-2 person assit at all times, no active abduction for at least 6 weeks CT L femur with 300 cc mixed density lesion along the L posterior buttock/upper thigh region compatible with hematoma (can't excluded morel lavallee lesion), vertically oriented fracture extending through greater tuberosity and into the anterior cortex of the proximal femoral metaphysis with about 1.5 cm of step off anteriorly, most c/w periprosthetic fracture (suspected small nondisplaed vertical component extending distally in the femoral shafter anteromedially).  Remote mid to distal femoral fx with buttress plate and screw fixation.  Likely chronic anterior cortical defect in the distal femoral metaphysis. F/u in 2 weeks with Dr. Lyla Glassing    Hematoma Suspected hematoma in L posterior buttock/upper thigh Ice to L thigh hematoma Will plan to continue warfarin for now, heparin if INR subtherapeutic  Anemia To 7.4 today Continue to trend and transfuse for <7 or symptomatic  H/O mitral valve replacement with mechanical valve S/p St. Jude Bileaflet mechanical valve replacement Continue anticoagulation (INR goal 2.5-3.5), bridge if INR subtherapeutic  Longstanding persistent atrial fibrillation: CHA2DS2-VASc Score 3 Warfarin Bisoprolol   Acute kidney injury superimposed on chronic kidney disease (HCC) Baseline appears to be 1.7 Bumped to 2 at presentation Improved now to below baseline Will trend  Prolonged QT interval Repeat EKG pending and follow  Essential hypertension Bisoprolol/hydralazine  Protein-calorie malnutrition, severe ([place]) Noted RD  c/s  Decubitus ulcer Pressure Injury 11/12/21 Coccyx Mid Stage 1 -  Intact skin with non-blanchable redness of Cheyan Frees localized area  usually over [name] bony prominence. stage one with pink color (Active)  11/12/21 2100  Location: Coccyx  Location Orientation: Mid  Staging: Stage 1 -  Intact skin with non-blanchable redness of [name] localized area usually over [name] bony prominence.  Wound Description (Comments): stage one with pink color  Present on Admission: Yes         DVT prophylaxis: warfarin Code Status: DNR Family Communication: none Disposition:   Status is: Inpatient Remains inpatient appropriate because: need for orthopedic c/s   Consultants:  ortho  Procedures:  none  Antimicrobials:  Anti-infectives (From admission, onward)    None       Subjective: No new complaints, asking me to call son  Objective: Vitals:   11/13/21 2230 11/14/21 0109 11/14/21 0502 11/14/21 1254  BP: (!) 104/53 111/65 117/72 (!) 102/47  Pulse: 74 89 73 61  Resp: '17 18 16 16  '$ Temp: 98.9 F (37.2 C) 98.2 F (36.8 C) 98.1 F (36.7 C) 97.9 F (36.6 C)  TempSrc: Oral  Oral Oral  SpO2: 96% 96% 95% 98%  Weight:      Height:        Intake/Output Summary (Last 24 hours) at 11/14/2021 1554 Last data filed at 11/14/2021 1051 Gross per 24 hour  Intake 460 ml  Output 675 ml  Net -215 ml   Filed Weights   11/12/21 1910  Weight: 63 kg    Examination:  General: No acute distress. Cardiovascular: RRR Lungs: unlabored Neurological: Alert and oriented 86. Moves all extremities 4. Cranial nerves II through XII grossly intact. Extremities: L thigh/buttock bruising    Data Reviewed: I have personally reviewed following labs and imaging studies  CBC: Recent Labs  Lab 11/12/21 1707 11/13/21 0347 11/13/21 1104 11/13/21 1722 11/14/21 0331 11/14/21 1210  WBC 4.7 3.0*  --   --  3.0*  --   NEUTROABS 3.4  --   --   --   --   --   HGB 10.0* 7.9* 7.8* 8.1* 7.2* 7.4*  HCT 31.4* 24.6* 23.5* 25.0* 22.2* 22.7*  MCV 100.3* 100.0  --   --  100.0  --   PLT 161 147*  --   --  134*  --     Basic Metabolic Panel: Recent  Labs  Lab 11/12/21 1707 11/13/21 0347 11/14/21 0331  NA 136 134* 139  K 4.4 3.8 4.1  CL 101 103 108  CO2 '28 26 26  '$ GLUCOSE 127* 97 96  BUN 43* 43* 34*  CREATININE 2.05* 1.60* 1.35*  CALCIUM 9.3 8.2* 8.4*  MG  --  2.3  --     GFR: Estimated Creatinine Clearance: 29.8 mL/min ([place]) (by C-G formula based on SCr of 1.35 mg/dL (H)).  Liver Function Tests: No results for input(s): AST, ALT, ALKPHOS, BILITOT, PROT, ALBUMIN in the last 168 hours.  CBG: No results for input(s): GLUCAP in the last 168 hours.   Recent Results (from the past 240 hour(s))  Surgical pcr screen     Status: None   Collection Time: 11/13/21  1:29 AM   Specimen: Nasal Mucosa; Nasal Swab  Result Value Ref Range Status   MRSA, PCR NEGATIVE NEGATIVE Final   Staphylococcus aureus NEGATIVE NEGATIVE Final    Comment: (NOTE) The Xpert SA Assay (FDA approved for NASAL specimens in patients 32 years of age and older), is one  component of Correna Meacham comprehensive surveillance program. It is not intended to diagnose infection nor to guide or monitor treatment. Performed at [facility], [place] [address],  [place],  [address]          Radiology Studies: DG Chest 1 View  Result Date: 11/12/2021 CLINICAL DATA:  Fall. EXAM: CHEST  1 VIEW COMPARISON:  PET-CT 10/16/2021 FINDINGS: Cardiomegaly. Atherosclerotic calcification of the aortic arch. Prosthetic mitral valve. Prior median sternotomy. Hazy opacities along the mid lungs are probably from skin folds. Old healed right rib fractures. IMPRESSION: 1. Stable cardiomegaly. 2. Hazy densities along the mid lungs bilaterally, probably skin folds. 3.  Aortic Atherosclerosis (ICD10-I70.0). Electronically Signed   By: Van Clines M.D.   On: 11/12/2021 18:42   CT Head Wo Contrast  Result Date: 11/12/2021 CLINICAL DATA:  Fall. EXAM: CT HEAD WITHOUT CONTRAST CT CERVICAL SPINE WITHOUT CONTRAST TECHNIQUE: Multidetector CT imaging of the head and cervical  spine was performed following the standard protocol without intravenous contrast. Multiplanar CT image reconstructions of the cervical spine were also generated. RADIATION DOSE REDUCTION: This exam was performed according to the departmental dose-optimization program which includes automated exposure control, adjustment of the mA and/or kV according to patient size and/or use of iterative reconstruction technique. COMPARISON:  None Available. FINDINGS: CT HEAD FINDINGS Brain: No evidence of acute infarction, hemorrhage, hydrocephalus, extra-axial collection or mass lesion/mass effect. There is moderate diffuse atrophy. There is mild periventricular white matter hypodensity, likely chronic small vessel ischemic change. Vascular: Atherosclerotic calcifications are present within the cavernous internal carotid arteries. Skull: Normal. Negative for fracture or focal lesion. Sinuses/Orbits: No acute finding. Other: None. CT CERVICAL SPINE FINDINGS Alignment: There is trace anterolisthesis at C6-C7 which is likely degenerative. Alignment is otherwise anatomic. Skull base and vertebrae: Bones are osteopenic. No acute fracture. No primary bone lesion or focal pathologic process. Soft tissues and spinal canal: No prevertebral fluid or swelling. No visible canal hematoma. Disc levels: There is severe disc space narrowing at C3-C4, C4-C5 and C5-C6. No significant central canal or neural foraminal stenosis at any level. Upper chest: Negative. Other: None. IMPRESSION: 1.  No acute intracranial process. 2. No acute fracture or traumatic subluxation of the cervical spine. 3. Moderate diffuse brain atrophy. Mild chronic small vessel ischemic change. 4.  Degenerative changes of the cervical spine as above. Electronically Signed   By: Ronney Asters M.D.   On: 11/12/2021 17:52   CT Cervical Spine Wo Contrast  Result Date: 11/12/2021 CLINICAL DATA:  Fall. EXAM: CT HEAD WITHOUT CONTRAST CT CERVICAL SPINE WITHOUT CONTRAST TECHNIQUE:  Multidetector CT imaging of the head and cervical spine was performed following the standard protocol without intravenous contrast. Multiplanar CT image reconstructions of the cervical spine were also generated. RADIATION DOSE REDUCTION: This exam was performed according to the departmental dose-optimization program which includes automated exposure control, adjustment of the mA and/or kV according to patient size and/or use of iterative reconstruction technique. COMPARISON:  None Available. FINDINGS: CT HEAD FINDINGS Brain: No evidence of acute infarction, hemorrhage, hydrocephalus, extra-axial collection or mass lesion/mass effect. There is moderate diffuse atrophy. There is mild periventricular white matter hypodensity, likely chronic small vessel ischemic change. Vascular: Atherosclerotic calcifications are present within the cavernous internal carotid arteries. Skull: Normal. Negative for fracture or focal lesion. Sinuses/Orbits: No acute finding. Other: None. CT CERVICAL SPINE FINDINGS Alignment: There is trace anterolisthesis at C6-C7 which is likely degenerative. Alignment is otherwise anatomic. Skull base and vertebrae: Bones are osteopenic. No acute fracture. No  primary bone lesion or focal pathologic process. Soft tissues and spinal canal: No prevertebral fluid or swelling. No visible canal hematoma. Disc levels: There is severe disc space narrowing at C3-C4, C4-C5 and C5-C6. No significant central canal or neural foraminal stenosis at any level. Upper chest: Negative. Other: None. IMPRESSION: 1.  No acute intracranial process. 2. No acute fracture or traumatic subluxation of the cervical spine. 3. Moderate diffuse brain atrophy. Mild chronic small vessel ischemic change. 4.  Degenerative changes of the cervical spine as above. Electronically Signed   By: Ronney Asters M.D.   On: 11/12/2021 17:52   CT FEMUR LEFT WO CONTRAST  Result Date: 11/12/2021 CLINICAL DATA:  Trauma, fall, possible fracture.  Unable to bear weight. EXAM: CT OF THE LOWER LEFT EXTREMITY WITHOUT CONTRAST TECHNIQUE: Multidetector CT imaging of the lower left extremity was performed according to the standard protocol. RADIATION DOSE REDUCTION: This exam was performed according to the departmental dose-optimization program which includes automated exposure control, adjustment of the mA and/or kV according to patient size and/or use of iterative reconstruction technique. COMPARISON:  Radiographs 08/28/2020 FINDINGS: Bones/Joint/Cartilage Streak artifact associated with the left total hip prosthesis left lateral buttress plate and screw fixator noted. Old healed left inferior pubic ramus fracture, image 72 series 2. Left femur proximal periprosthetic fracture involving the greater trochanter and anterolateral left proximal femoral metaphysis, with about 1.2 cm of cortical step-off anteriorly on image 74 series 4. Suspected vertical nondisplaced component of this fracture extending distally in the femoral shaft anteromedial to the stem of the prosthesis for example on image 126 of series 2. This becomes indistinct distally. There is Marzell Allemand previous oblique fracture of the femoral diaphysis with healing response noted along the upper fracture margins for example on image 184 of series 2, traversed by the buttress plate and with cerclage is in the vicinity of the fracture. On image 219 of series 2, there is Joanthan Hlavacek 1.8 by 3.5 cm cortical defect in the anterior femoral shaft which is probably chronic. Osteoarthritis of the knee is present. Ligaments Suboptimally assessed by CT. Muscles and Tendons Unremarkable Soft tissues Indirect left inguinal hernia contains loop of bowel without strangulation or obstruction. Substantial atherosclerosis. 7.9 by 5.1 by 14.4 cm (volume = 300 cm^3) mixed density subcutaneous structure along the left buttock and upper thigh region favoring hematoma. This partially abuts the superficial fascia margin. There is substantial  surrounding subcutaneous edema along the buttock and tracking in the left lateral thigh. IMPRESSION: 1. 300 cc mixed density subcutaneous lesion along the left posterior buttock/upper thigh region compatible with hematoma. Cannot completely exclude Sherry Ruffing lesion as this abuts the superficial fascia margin. Surrounding subcutaneous edema noted. 2. Primarily vertically oriented fracture extending through the greater tuberosity and into the anterior cortex of the proximal femoral metaphysis, with about 1.5 cm of step-off anteriorly, most compatible with Evia Goldsmith periprosthetic fracture. Suspected small nondisplaced vertical component extending distally in the femoral shaft anteromedially. 3. Remote mid to distal femoral fracture with buttress plate and screw fixation. Likely chronic anterior cortical defect in the distal femoral metaphysis. 4. Left inguinal hernia contains bowel. 5. Old left inferior pubic ramus fracture. Electronically Signed   By: Van Clines M.D.   On: 11/12/2021 18:06   DG Knee Complete 4 Views Left  Result Date: 11/12/2021 CLINICAL DATA:  Fall EXAM: LEFT KNEE - COMPLETE 4+ VIEW COMPARISON:  CT scan from 11/12/2021 FINDINGS: Bony demineralization. Distal femoral buttress plate laterally with cerclage fixation along the site of the prior  oblique shaft fracture. Articular space narrowing in the knee especially in the medial compartment. Trace knee effusion. Substantial spurring in the patellofemoral joint. Distal SFA and popliteal artery atherosclerotic calcification. IMPRESSION: 1. Substantial osteoarthritis of the knee.  Small knee effusion. 2. Postoperative findings in the distal femur. 3. Atherosclerosis. Electronically Signed   By: Van Clines M.D.   On: 11/12/2021 18:40   DG Hip Unilat With Pelvis 2-3 Views Left  Result Date: 11/12/2021 CLINICAL DATA:  Fall. EXAM: DG HIP (WITH OR WITHOUT PELVIS) 2-3V LEFT COMPARISON:  Left hip x-ray 08/28/2020 FINDINGS: Bones are  diffusely osteopenic. Left hip arthroplasty is again seen in anatomic alignment. There is Zaydah Nawabi lateral mid femoral sideplate, partially imaged. There is no evidence for hardware loosening. There is an acute oblique fracture through the lateral aspect of the proximal left femur just inferior to the intratrochanteric region. Fracture fragment is distracted 1 cm. There is Martavious Hartel healed left inferior pubic ramus fracture. Right hip arthroplasty appears uncomplicated. IMPRESSION: 1. Acute fracture through the proximal left femur just inferior to the intratrochanteric region. 2. Left hip arthroplasty and femoral sideplate appear in anatomic alignment. Electronically Signed   By: Ronney Asters M.D.   On: 11/12/2021 18:41        Scheduled Meds:  bisoprolol  5 mg Oral Daily   feeding supplement  237 mL Oral TID BM   hydrALAZINE  25 mg Oral Daily   isosorbide mononitrate  30 mg Oral Daily   multivitamin with minerals  1 tablet Oral Daily   warfarin  2.5 mg Oral ONCE-1600   Warfarin - Pharmacist Dosing Inpatient   Does not apply q1600   Continuous Infusions:   LOS: 2 days    Time spent: over 30 min    Fayrene Helper, MD [facility]   To contact the attending provider between 7A-7P or the covering provider during after hours 7P-7A, please log into the web site [email or web address] and access using universal [place] password for that web site. If you do not have the password, please call the hospital operator.  11/14/2021, 3:54 PM

## 2021-11-14 NOTE — Progress Notes (Signed)
Alerted MD to low BP (102/47) after giving him 3 BP medications this AM.   Patient is asymptomatic at this time. MD states he will follow.    [place], Therapist, sports

## 2021-11-14 NOTE — Progress Notes (Signed)
Richard Spears for warfarin Indication:  mechanical mitral valve  Allergies  Allergen Reactions   Flexeril [Cyclobenzaprine] Diarrhea    Patient Measurements: Height: 6' (182.9 cm) Weight: 63 kg (138 lb 14.2 oz) IBW/kg (Calculated) : 77.6 Heparin Dosing Weight: n/a  Vital Signs: Temp: 98.1 F (36.7 C) (05/20 0502) Temp Source: Oral (05/20 0502) BP: 117/72 (05/20 0502) Pulse Rate: 73 (05/20 0502)  Labs: Recent Labs    11/12/21 1707 11/13/21 0347 11/13/21 1104 11/13/21 1722 11/14/21 0331  HGB 10.0* 7.9* 7.8* 8.1* 7.2*  HCT 31.4* 24.6* 23.5* 25.0* 22.2*  PLT 161 147*  --   --  134*  LABPROT 27.8* 33.2*  --   --  30.1*  INR 2.6* 3.3*  --   --  2.9*  CREATININE 2.05* 1.60*  --   --  1.35*     Estimated Creatinine Clearance: 29.8 mL/min (A) (by C-G formula based on SCr of 1.35 mg/dL (H)).   Medications: Pt is on warfarin PTA -Home dose (confirmed with [facility] clinic note on 11/02/21):   Warfarin 5 mg MWF, 2.5 mg all other days of the week  INR goal: 2.5 - 3.5 -Last dose PTA: 5/18 ~8 am  Assessment: Pt is a 84 yoM presenting with left hip pain s/p fall. PMH significant for ORIF of distal left femur in March 2022, s/p partial colectomy in Jan 2023. Patient has atrial fibrillation and a mechanical mitral valve for which he is chronically on warfarin. Pharmacy consulted to manage while inpatient.   Today, 11/14/21 INR remains therapeutic CBC: Hgb dropped 10 > 7.2 MD noted, planning to transfuse if Hgb <7. Plt slightly low.  Noted suspected hematoma in L posterior buttock/upper thigh Diet: Regular; meal intake not charted No major DDI   Goal of Therapy:  INR 2.5 - 3.5  Plan:  Warfarin 2.5 mg PO x 1 (PTA dosing) INR daily. CBC with AM labs tomorrow. Monitor for signs of bleeding or worsening hematoma Given mechanical valve, if INR becomes subtherapeutic (or patient ends up needing surgical intervention), will need to be bridged with  parenteral anticoagulation  Richard Spears A, PharmD 11/14/2021,11:25 AM

## 2021-11-15 ENCOUNTER — Inpatient Hospital Stay (HOSPITAL_COMMUNITY): Payer: Medicare Other

## 2021-11-15 DIAGNOSIS — M978XXA Periprosthetic fracture around other internal prosthetic joint, initial encounter: Secondary | ICD-10-CM | POA: Diagnosis not present

## 2021-11-15 DIAGNOSIS — M25522 Pain in left elbow: Secondary | ICD-10-CM

## 2021-11-15 DIAGNOSIS — Z96649 Presence of unspecified artificial hip joint: Secondary | ICD-10-CM | POA: Diagnosis not present

## 2021-11-15 LAB — CBC
HCT: 24.9 % — ABNORMAL LOW (ref 39.0–52.0)
Hemoglobin: 7.9 g/dL — ABNORMAL LOW (ref 13.0–17.0)
MCH: 31.2 pg (ref 26.0–34.0)
MCHC: 31.7 g/dL (ref 30.0–36.0)
MCV: 98.4 fL (ref 80.0–100.0)
Platelets: 153 10*3/uL (ref 150–400)
RBC: 2.53 MIL/uL — ABNORMAL LOW (ref 4.22–5.81)
RDW: 19.9 % — ABNORMAL HIGH (ref 11.5–15.5)
WBC: 3.1 10*3/uL — ABNORMAL LOW (ref 4.0–10.5)
nRBC: 0 % (ref 0.0–0.2)

## 2021-11-15 LAB — PROTIME-INR
INR: 3.3 — ABNORMAL HIGH (ref 0.8–1.2)
Prothrombin Time: 33.5 seconds — ABNORMAL HIGH (ref 11.4–15.2)

## 2021-11-15 LAB — BASIC METABOLIC PANEL
Anion gap: 3 — ABNORMAL LOW (ref 5–15)
BUN: 32 mg/dL — ABNORMAL HIGH (ref 8–23)
CO2: 27 mmol/L (ref 22–32)
Calcium: 8.5 mg/dL — ABNORMAL LOW (ref 8.9–10.3)
Chloride: 108 mmol/L (ref 98–111)
Creatinine, Ser: 1.3 mg/dL — ABNORMAL HIGH (ref 0.61–1.24)
GFR, Estimated: 51 mL/min — ABNORMAL LOW (ref >60)
Glucose, Bld: 112 mg/dL — ABNORMAL HIGH (ref 70–99)
Potassium: 4.5 mmol/L (ref 3.5–5.1)
Sodium: 138 mmol/L (ref 135–145)

## 2021-11-15 LAB — BPAM RBC
Blood Product Expiration Date: 202306192359
ISSUE DATE / TIME: 202305202204
Unit Type and Rh: 5100

## 2021-11-15 LAB — TYPE AND SCREEN
ABO/RH(D): O POS
Antibody Screen: NEGATIVE
Unit division: 0

## 2021-11-15 MED ORDER — WARFARIN SODIUM 2.5 MG PO TABS
2.5000 mg | ORAL_TABLET | Freq: Once | ORAL | Status: AC
  Administered 2021-11-15: 2.5 mg via ORAL
  Filled 2021-11-15: qty 1

## 2021-11-15 NOTE — Plan of Care (Signed)
  Problem: Coping: Goal: Level of anxiety will decrease Outcome: Progressing   Problem: Pain Managment: Goal: General experience of comfort will improve Outcome: Progressing   Problem: Safety: Goal: Ability to remain free from injury will improve Outcome: Progressing   

## 2021-11-15 NOTE — Plan of Care (Signed)
?  Problem: Clinical Measurements: ?Goal: Respiratory complications will improve ?Outcome: Progressing ?  ?Problem: Clinical Measurements: ?Goal: Cardiovascular complication will be avoided ?Outcome: Progressing ?  ?Problem: Coping: ?Goal: Level of anxiety will decrease ?Outcome: Progressing ?  ?Problem: Elimination: ?Goal: Will not experience complications related to bowel motility ?Outcome: Progressing ?  ?

## 2021-11-15 NOTE — Progress Notes (Signed)
PROGRESS NOTE    Richard Spears  FWY:[id number] DOB: [birth year] DOA: 11/12/2021 PCP: [name], MD  Chief Complaint  Patient presents with   Fall    Brief Narrative:   Richard Spears is Richard Spears pleasant 88 y.o. male with medical history significant for mechanical mitral valve and atrial fibrillation on warfarin, CKD stage IIIb, hypertension, ORIF of distal left femur in March 2022, and cancer of the transverse colon, now presenting to the emergency department with left hip pain after fall.  The patient reports that he was in his usual state when he was using the restroom, believes he became tangled in his pants and briefs, and fell onto his buttock.  He was experiencing pain immediately but initially refused transport to the ED until he was unable to bear weight later in the day.  He denies any recent chest pain or palpitations, denies cough or shortness of breath, and denies hitting his head or losing consciousness when he fell.   ED Course: Upon arrival to the ED, patient is found to be afebrile and saturating well on room air with stable blood pressure.  EKG features atrial fibrillation with QTc interval 539 ms.  CT of the left hip is suggestive of periprosthetic left femur fracture and left posterior buttock/upper thigh hematoma.  Orthopedic surgery was consulted by the ED physician and the patient was treated with fentanyl.      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 Kathaleen Bury via ED note, plan for nonsurgical management Ortho recommending no surgical intervention, LLE 50% partial weightbearing with walker and 1-2 person assit at all times, no active abduction for at least 6 weeks CT L femur with 300 cc mixed density lesion along the L posterior buttock/upper thigh region compatible with hematoma (can't excluded morel lavallee lesion), vertically oriented fracture extending through greater tuberosity and into the anterior cortex of the proximal femoral metaphysis with about 1.5 cm of step off anteriorly, most c/w periprosthetic fracture (suspected small nondisplaed vertical component extending distally in the femoral shafter anteromedially).  Remote mid to distal femoral fx with buttress plate and screw fixation.  Likely chronic anterior cortical defect in the distal femoral metaphysis. F/u in 2 weeks with Dr. Lyla Glassing    Hematoma Suspected hematoma in L posterior buttock/upper thigh Appears relatively stable Ice to L thigh hematoma Will plan to continue warfarin for now, heparin if INR subtherapeutic  Anemia Transfused 1 unit pRBC 5/20 Hb today 7.9 (borderline bump, probably inappropriate, will trend) Continue to trend and transfuse for <7 or symptomatic  H/O mitral valve replacement with mechanical valve S/p St. Jude Bileaflet mechanical valve replacement Continue anticoagulation (INR goal 2.5-3.5), bridge if INR subtherapeutic  Left elbow pain Follow plain films Has foam dressing in place over skin tear  Longstanding persistent atrial fibrillation: CHA2DS2-VASc Score 3 Warfarin Bisoprolol   Acute kidney injury superimposed on chronic kidney disease (HCC) Baseline appears to  be 1.7 Bumped to 2 at presentation Improved now to below baseline Will trend  Prolonged QT interval Repeat EKG pending and follow  Essential  hypertension Bisoprolol/hydralazine  Protein-calorie malnutrition, severe (HCC) Noted RD c/s  Decubitus ulcer Pressure Injury 11/12/21 Coccyx Mid Stage 1 -  Intact skin with non-blanchable redness of Seyed Heffley localized area usually over Valeda Corzine bony prominence. stage one with pink color (Active)  11/12/21 2100  Location: Coccyx  Location Orientation: Mid  Staging: Stage 1 -  Intact skin with non-blanchable redness of Juneau Doughman localized area usually over Maram Bently bony prominence.  Wound Description (Comments): stage one with pink color  Present on Admission: Yes         DVT prophylaxis: warfarin Code Status: DNR Family Communication: son 5/20 pm over phone Disposition:   Status is: Inpatient Remains inpatient appropriate because: need for orthopedic c/s   Consultants:  ortho  Procedures:  none  Antimicrobials:  Anti-infectives (From admission, onward)    None       Subjective: C/o L elbow pain  Objective: Vitals:   11/14/21 2300 11/15/21 0057 11/15/21 0600 11/15/21 0924  BP: (!) 122/47 115/60 129/67 120/72  Pulse: 80 68 87 72  Resp: '14 15 16   '$ Temp: 99.2 F (37.3 C) 99.3 F (37.4 C) 98.3 F (36.8 C)   TempSrc: Oral Oral Oral   SpO2: 96% 96% 98%   Weight:      Height:        Intake/Output Summary (Last 24 hours) at 11/15/2021 1036 Last data filed at 11/15/2021 1007 Gross per 24 hour  Intake 1736 ml  Output 1700 ml  Net 36 ml   Filed Weights   11/12/21 1910  Weight: 63 kg    Examination:  General: No acute distress. Cardiovascular: RRR Lungs: unlabored Abdomen: Soft, nontender, nondistended Neurological: Alert and oriented 3. Moves all extremities 4. Cranial nerves II through XII grossly intact. Extremities: L hip/thigh bruising, no TTP to L elbow, but c/o pain with ROM    Data Reviewed: I have personally reviewed following labs and imaging studies  CBC: Recent Labs  Lab 11/12/21 1707 11/13/21 0347 11/13/21 1104 11/13/21 1722 11/14/21 0331  11/14/21 1210 11/15/21 0306  WBC 4.7 3.0*  --   --  3.0*  --  3.1*  NEUTROABS 3.4  --   --   --   --   --   --   HGB 10.0* 7.9* 7.8* 8.1* 7.2* 7.4* 7.9*  HCT 31.4* 24.6* 23.5* 25.0* 22.2* 22.7* 24.9*  MCV 100.3* 100.0  --   --  100.0  --  98.4  PLT 161 147*  --   --  134*  --  440    Basic Metabolic Panel: Recent Labs  Lab 11/12/21 1707 11/13/21 0347 11/14/21 0331 11/15/21 0306  NA 136 134* 139 138  K 4.4 3.8 4.1 4.5  CL 101 103 108 108  CO2 '28 26 26 27  '$ GLUCOSE 127* 97 96 112*  BUN 43* 43* 34* 32*  CREATININE 2.05* 1.60* 1.35* 1.30*  CALCIUM 9.3 8.2* 8.4* 8.5*  MG  --  2.3  --   --     GFR: Estimated Creatinine Clearance: 31 mL/min (Shanara Schnieders) (by C-G formula based on SCr of 1.3 mg/dL (H)).  Liver Function Tests: No results for input(s): AST, ALT, ALKPHOS, BILITOT, PROT, ALBUMIN in the last 168 hours.  CBG: No results for input(s): GLUCAP in the last 168 hours.   Recent Results (from the past 240 hour(s))  Surgical  pcr screen     Status: None   Collection Time: 11/13/21  1:29 AM   Specimen: Nasal Mucosa; Nasal Swab  Result Value Ref Range Status   MRSA, PCR NEGATIVE NEGATIVE Final   Staphylococcus aureus NEGATIVE NEGATIVE Final    Comment: (NOTE) The Xpert SA Assay (FDA approved for NASAL specimens in patients 34 years of age and older), is one component of Telisa Ohlsen comprehensive surveillance program. It is not intended to diagnose infection nor to guide or monitor treatment. Performed at [facility], [place] [address] [place], [place] [address]          Radiology Studies: No results found.      Scheduled Meds:  bisoprolol  5 mg Oral Daily   feeding supplement  237 mL Oral TID BM   hydrALAZINE  25 mg Oral Daily   isosorbide mononitrate  30 mg Oral Daily   multivitamin with minerals  1 tablet Oral Daily   Warfarin - Pharmacist Dosing Inpatient   Does not apply q1600   Continuous Infusions:   LOS: 3 days    Time spent: over 30  min    Fayrene Helper, MD [facility]   To contact the attending provider between 7A-7P or the covering provider during after hours 7P-7A, please log into the web site [email or web address] and access using universal [place] password for that web site. If you do not have the password, please call the hospital operator.  11/15/2021, 10:36 AM

## 2021-11-15 NOTE — Progress Notes (Signed)
PT Cancellation Note  Patient Details Name: Richard Spears MRN: [id number] DOB: 1925-08-28   Cancelled Treatment:    Reason Eval/Treat Not Completed:  RN reports she is asking MD for an order for xray of patient's L UE. Will hold PT for now.    [facility] [facility]  Office: [phone] Pager: [phone]

## 2021-11-15 NOTE — Assessment & Plan Note (Addendum)
Follow plain films -> negative Has foam dressing in place over skin tear Follow with ortho outpatient

## 2021-11-15 NOTE — Evaluation (Signed)
Occupational Therapy Evaluation Patient Details Name: Richard Spears MRN: [id number] DOB: 1926/06/16 Today's Date: 11/15/2021   History of Present Illness Richard Spears is a pleasant 88 y.o. male with medical history significant for mechanical mitral valve and atrial fibrillation on warfarin, CKD stage IIIb, hypertension, ORIF of distal left femur in March 2022, and cancer of the transverse colon, now presenting to the emergency department with left hip pain after fall. Found to have Periprosthetic fracture around internal prosthetic. Ortho recommending PWB 50% x 6 weeks and No Abduction.   Clinical Impression   Richard Spears is a p77 year old man s/p hip fracture repair with decreased ROM and strength of LLE, impaired balance, decreased activity tolerance, PWB status and complaints of pain resulting in a sudden decline in functional mobility and ability to perform independent ADLs. Patient needing mod x 2 to transfer to edge of bed, min assist to stand +2 assistance to ambulate with chair follow. Patient fatigued quickly. Reports pain in left forearm as well. Patient needing total assist for toileting and LB dressing. Patient will benefit from skilled OT services while in hospital to improve deficits and learn compensatory strategies as needed in order to improve functional abilities.       Recommendations for follow up therapy are one component of a multi-disciplinary discharge planning process, led by the attending physician.  Recommendations may be updated based on patient status, additional functional criteria and insurance authorization.   Follow Up Recommendations  Skilled nursing-short term rehab (<3 hours/day)    Assistance Recommended at Discharge Frequent or constant Supervision/Assistance  Patient can return home with the following A lot of help with walking and/or transfers;A lot of help with bathing/dressing/bathroom;Assistance with cooking/housework;Help with  stairs or ramp for entrance    Functional Status Assessment  Patient has had a recent decline in their functional status and demonstrates the ability to make significant improvements in function in a reasonable and predictable amount of time.  Equipment Recommendations  None recommended by OT    Recommendations for Other Services       Precautions / Restrictions Precautions Precautions: Fall Restrictions Weight Bearing Restrictions: Yes LLE Weight Bearing: Partial weight bearing LLE Partial Weight Bearing Percentage or Pounds: 50 Other Position/Activity Restrictions: No Abduction      Mobility Bed Mobility                    Transfers                          Balance Overall balance assessment: Needs assistance Sitting-balance support: No upper extremity supported, Feet supported Sitting balance-Leahy Scale: Fair     Standing balance support: Reliant on assistive device for balance, Bilateral upper extremity supported Standing balance-Leahy Scale: Poor                             ADL either performed or assessed with clinical judgement   ADL Overall ADL's : Needs assistance/impaired Eating/Feeding: Independent   Grooming: Set up;Sitting   Upper Body Bathing: Set up;Sitting   Lower Body Bathing: Moderate assistance;Sitting/lateral leans   Upper Body Dressing : Set up;Sitting   Lower Body Dressing: Maximal assistance;Sit to/from stand   Toilet Transfer: Minimal assistance;Rolling walker (2 wheels);+2 for safety/equipment   Toileting- Clothing Manipulation and Hygiene: Total assistance;Sit to/from stand       Functional mobility during ADLs: Minimal assistance;+2 for safety/equipment General  ADL Comments: Patient needing increased assistance for ADls due to [facility] status and pain in hip. Mod x 2 to transfer out of bed - for LEs and hand hold to pull up trunk. Min x 2 for safety for ambulation and chair follow.     Vision Baseline  Vision/History: 1 Wears glasses       Perception     Praxis      Pertinent Vitals/Pain Pain Assessment Pain Assessment: Faces Faces Pain Scale: Hurts little more Pain Location: LLE Pain Descriptors / Indicators: Grimacing Pain Intervention(s): Monitored during session     Hand Dominance Right   Extremity/Trunk Assessment Upper Extremity Assessment Upper Extremity Assessment: Overall WFL for tasks assessed   Lower Extremity Assessment Lower Extremity Assessment: Defer to PT evaluation   Cervical / Trunk Assessment Cervical / Trunk Assessment: Kyphotic   Communication Communication Communication: No difficulties   Cognition Arousal/Alertness: Awake/alert Behavior During Therapy: WFL for tasks assessed/performed Overall Cognitive Status: Within Functional Limits for tasks assessed                                 General Comments: Mild expressive difficulties     General Comments       Exercises     Shoulder Instructions      Home Living Family/patient expects to be discharged to:: Skilled nursing facility Living Arrangements: Spouse/significant other                               Additional Comments: ILF at [facility], cleaners monthly, get one meal provided      Prior Functioning/Environment Prior Level of Function : Independent/Modified Independent             Mobility Comments: RW in ILF apartment and rollator in community dwellings. ADLs Comments: Stopped driving recently, makes small meals        OT Problem List: Decreased strength;Decreased range of motion;Decreased activity tolerance;Impaired balance (sitting and/or standing);Pain;Decreased knowledge of use of DME or AE      OT Treatment/Interventions: Self-care/ADL training;Therapeutic exercise;DME and/or AE instruction;Therapeutic activities;Balance training;Patient/family education    OT Goals(Current goals can be found in the care plan section) Acute Rehab  OT Goals Patient Stated Goal: be as independent as possible OT Goal Formulation: With patient Time For Goal Achievement: 11/29/21 Potential to Achieve Goals: Good  OT Frequency: Min 2X/week    Co-evaluation PT/OT/SLP Co-Evaluation/Treatment: Yes (co-eval)            AM-PAC OT "6 Clicks" Daily Activity     Outcome Measure Help from another person eating meals?: None Help from another person taking care of personal grooming?: A Little Help from another person toileting, which includes using toliet, bedpan, or urinal?: Total Help from another person bathing (including washing, rinsing, drying)?: A Lot Help from another person to put on and taking off regular upper body clothing?: A Little Help from another person to put on and taking off regular lower body clothing?: Total 6 Click Score: 14   End of Session Equipment Utilized During Treatment: Gait belt;Rolling walker (2 wheels) Nurse Communication: Mobility status  Activity Tolerance: Patient limited by pain;Patient limited by fatigue Patient left: in chair;with call bell/phone within reach  OT Visit Diagnosis: Other abnormalities of gait and mobility (R26.89);Pain                Time: 1550-1616 OT Time Calculation (  min): 26 min Charges:  OT General Charges $OT Visit: 1 Visit OT Evaluation $OT Eval Low Complexity: 1 Low  Donald Jacque, OTR/L [place]  Office [phone] Pager: [place] 11/15/2021, 4:36 PM

## 2021-11-15 NOTE — Evaluation (Signed)
Physical Therapy Evaluation Patient Details Name: Richard Spears MRN: [id number] DOB: 11/29/1925 Today's Date: 11/15/2021  History of Present Illness  88 y.o. male with medical history significant for mechanical mitral valve and atrial fibrillation on warfarin, CKD stage IIIb, hypertension, ORIF of distal left femur in March 2022, and cancer of the transverse colon, now presenting to the emergency department with left hip pain after fall. Found to have Periprosthetic fracture around internal prosthetic-managing conservatively.  Clinical Impression  On eval, pt required Mod A +2 for bed mobility and Min A+2 for transfers and gait training. Moderate pain with activity. Educated pt on PWB 50% status and no active hip abduction on L LE. Pt will need ST SNF for continued rehab. Will plan to follow during hospital stay.        Recommendations for follow up therapy are one component of a multi-disciplinary discharge planning process, led by the attending physician.  Recommendations may be updated based on patient status, additional functional criteria and insurance authorization.  Follow Up Recommendations Skilled nursing-short term rehab (<3 hours/day)    Assistance Recommended at Discharge Frequent or constant Supervision/Assistance  Patient can return home with the following  Assist for transportation;Help with stairs or ramp for entrance;Assistance with cooking/housework;A lot of help with walking and/or transfers;A lot of help with bathing/dressing/bathroom    Equipment Recommendations None recommended by PT  Recommendations for Other Services       Functional Status Assessment Patient has had a recent decline in their functional status and demonstrates the ability to make significant improvements in function in a reasonable and predictable amount of time.     Precautions / Restrictions Precautions Precautions: Fall Precaution Comments: No active L hip ABDuction Restrictions Weight  Bearing Restrictions: Yes LLE Weight Bearing: Partial weight bearing LLE Partial Weight Bearing Percentage or Pounds: 50 Other Position/Activity Restrictions: No Abduction      Mobility  Bed Mobility Overal bed mobility: Needs Assistance Bed Mobility: Supine to Sit     Supine to sit: Mod assist, +2 for physical assistance, +2 for safety/equipment, HOB elevated     General bed mobility comments: Assist for trunk and bil LEs. Utilized bedpad for scooting, positioning. Increased time. Cues for safety, technique, adherence to precautions.    Transfers Overall transfer level: Needs assistance Equipment used: Rolling walker (2 wheels) Transfers: Sit to/from Stand Sit to Stand: Min assist, From elevated surface, +2 physical assistance, +2 safety/equipment           General transfer comment: Assist to power up, steady, control descent. Cues for safety, technique, hand/LE placement.    Ambulation/Gait Ambulation/Gait assistance: Min assist, +2 safety/equipment Gait Distance (Feet): 12 Feet Assistive device: Rolling walker (2 wheels) Gait Pattern/deviations: Step-to pattern, Antalgic       General Gait Details: Cues for safety, technique, adherence to [facility] status/increased use of UEs to aid with WBing. Assist to stabilize throughout distance and to follow with recliner.  Stairs            Wheelchair Mobility    Modified Rankin (Stroke Patients Only)       Balance Overall balance assessment: Needs assistance, History of Falls Sitting-balance support: Feet supported, Bilateral upper extremity supported Sitting balance-Leahy Scale: Fair     Standing balance support: Reliant on assistive device for balance, Bilateral upper extremity supported, During functional activity Standing balance-Leahy Scale: Poor  Pertinent Vitals/Pain Pain Assessment Pain Assessment: Faces Faces Pain Scale: Hurts even more Pain Location:  LLE Pain Descriptors / Indicators: Grimacing, Operative site guarding Pain Intervention(s): Limited activity within patient's tolerance, Monitored during session, Repositioned    Home Living Family/patient expects to be discharged to:: Skilled nursing facility Living Arrangements: Spouse/significant other                 Additional Comments: ILF at [facility], cleaners monthly, get one meal provided    Prior Function Prior Level of Function : Independent/Modified Independent             Mobility Comments: RW in ILF apartment and rollator in community dwellings. ADLs Comments: Stopped driving recently, makes small meals     Hand Dominance   Dominant Hand: Right    Extremity/Trunk Assessment   Upper Extremity Assessment Upper Extremity Assessment: Defer to OT evaluation    Lower Extremity Assessment Lower Extremity Assessment: Generalized weakness (bil lower leg discoloration)    Cervical / Trunk Assessment Cervical / Trunk Assessment: Kyphotic  Communication   Communication: No difficulties  Cognition Arousal/Alertness: Awake/alert Behavior During Therapy: WFL for tasks assessed/performed Overall Cognitive Status: Within Functional Limits for tasks assessed                                 General Comments: Mild expressive difficulties        General Comments      Exercises     Assessment/Plan    PT Assessment Patient needs continued PT services  PT Problem List Decreased strength;Decreased mobility;Decreased range of motion;Decreased activity tolerance;Decreased balance;Decreased knowledge of use of DME;Pain       PT Treatment Interventions DME instruction;Gait training;Therapeutic activities;Therapeutic exercise;Patient/family education;Functional mobility training;Balance training    PT Goals (Current goals can be found in the Care Plan section)  Acute Rehab PT Goals Patient Stated Goal: regain PLOF/independence PT Goal  Formulation: With patient Time For Goal Achievement: 11/29/21 Potential to Achieve Goals: Good    Frequency Min 3X/week     Co-evaluation               AM-PAC PT "6 Clicks" Mobility  Outcome Measure Help needed turning from your back to your side while in a flat bed without using bedrails?: A Lot Help needed moving from lying on your back to sitting on the side of a flat bed without using bedrails?: A Lot Help needed moving to and from a bed to a chair (including a wheelchair)?: A Lot Help needed standing up from a chair using your arms (e.g., wheelchair or bedside chair)?: A Lot Help needed to walk in hospital room?: A Lot Help needed climbing 3-5 steps with a railing? : Total 6 Click Score: 11    End of Session Equipment Utilized During Treatment: Gait belt Activity Tolerance: Patient limited by fatigue;Patient limited by pain Patient left: in chair;with call bell/phone within reach;with chair alarm set   PT Visit Diagnosis: Difficulty in walking, not elsewhere classified (R26.2);Pain;History of falling (Z91.81);Muscle weakness (generalized) (M62.81) Pain - Right/Left: Left Pain - part of body: Leg    Time: 1550-1616 PT Time Calculation (min) (ACUTE ONLY): 26 min   Charges:   PT Evaluation $PT Eval Moderate Complexity: 1 Mod             Doreatha Massed, PT Acute Rehabilitation  Office: [phone] Pager: [phone]

## 2021-11-15 NOTE — Progress Notes (Signed)
ANTICOAGULATION CONSULT NOTE  Pharmacy Consult for warfarin Indication:  mechanical mitral valve  Allergies  Allergen Reactions   Flexeril [Cyclobenzaprine] Diarrhea    Patient Measurements: Height: 6' (182.9 cm) Weight: 63 kg (138 lb 14.2 oz) IBW/kg (Calculated) : 77.6  Vital Signs: Temp: 98.3 F (36.8 C) (05/21 0600) Temp Source: Oral (05/21 0600) BP: 120/72 (05/21 0924) Pulse Rate: 72 (05/21 0924)  Labs: Recent Labs    11/13/21 0347 11/13/21 1104 11/14/21 0331 11/14/21 1210 11/15/21 0306  HGB 7.9*   < > 7.2* 7.4* 7.9*  HCT 24.6*   < > 22.2* 22.7* 24.9*  PLT 147*  --  134*  --  153  LABPROT 33.2*  --  30.1*  --  33.5*  INR 3.3*  --  2.9*  --  3.3*  CREATININE 1.60*  --  1.35*  --  1.30*   < > = values in this interval not displayed.     Estimated Creatinine Clearance: 31 mL/min (A) (by C-G formula based on SCr of 1.3 mg/dL (H)).   Medications: Pt is on warfarin PTA -Home dose (confirmed with [facility] clinic note on 11/02/21):   Warfarin 5 mg MWF, 2.5 mg all other days of the week  INR goal: 2.5 - 3.5 -Last dose PTA: 5/18 ~8 am  Assessment: Pt is a 58 yoM presenting with left hip pain s/p fall. PMH significant for ORIF of distal left femur in March 2022, s/p partial colectomy in Jan 2023. Patient has atrial fibrillation and a mechanical mitral valve for which he is chronically on warfarin. Pharmacy consulted to manage while inpatient.   Today, 11/15/21 INR remains therapeutic on PTA warfarin dosing CBC: Hgb remains low but improved somewhat after 1 unit PRBC yesterday Suspected hematoma in L posterior buttock/upper thigh Diet: Regular; eating 100% of most meals No major DDI   Goal of Therapy:  INR 2.5 - 3.5  Plan:  Warfarin 2.5 mg PO x 1 (PTA dosing) INR daily; CBC with AM labs tomorrow. Monitor for signs of bleeding or worsening hematoma Given mechanical valve, if INR becomes subtherapeutic (or patient ends up needing surgical intervention), will need to be  bridged with parenteral anticoagulation  Emaree Chiu A, PharmD 11/15/2021,10:53 AM

## 2021-11-15 NOTE — Progress Notes (Signed)
Md notified of pt left arm pain. No obvious deformity at site. Md will get x ray.

## 2021-11-16 DIAGNOSIS — M978XXA Periprosthetic fracture around other internal prosthetic joint, initial encounter: Secondary | ICD-10-CM | POA: Diagnosis not present

## 2021-11-16 DIAGNOSIS — Z96649 Presence of unspecified artificial hip joint: Secondary | ICD-10-CM | POA: Diagnosis not present

## 2021-11-16 LAB — COMPREHENSIVE METABOLIC PANEL
ALT: 13 U/L (ref 0–44)
AST: 23 U/L (ref 15–41)
Albumin: 2.7 g/dL — ABNORMAL LOW (ref 3.5–5.0)
Alkaline Phosphatase: 43 U/L (ref 38–126)
Anion gap: 6 (ref 5–15)
BUN: 29 mg/dL — ABNORMAL HIGH (ref 8–23)
CO2: 25 mmol/L (ref 22–32)
Calcium: 8.6 mg/dL — ABNORMAL LOW (ref 8.9–10.3)
Chloride: 105 mmol/L (ref 98–111)
Creatinine, Ser: 1.22 mg/dL (ref 0.61–1.24)
GFR, Estimated: 55 mL/min — ABNORMAL LOW (ref >60)
Glucose, Bld: 126 mg/dL — ABNORMAL HIGH (ref 70–99)
Potassium: 4.6 mmol/L (ref 3.5–5.1)
Sodium: 136 mmol/L (ref 135–145)
Total Bilirubin: 0.9 mg/dL (ref 0.3–1.2)
Total Protein: 5.7 g/dL — ABNORMAL LOW (ref 6.5–8.1)

## 2021-11-16 LAB — CBC WITH DIFFERENTIAL/PLATELET
Abs Immature Granulocytes: 0.06 10*3/uL (ref 0.00–0.07)
Basophils Absolute: 0 10*3/uL (ref 0.0–0.1)
Basophils Relative: 1 %
Eosinophils Absolute: 0.2 10*3/uL (ref 0.0–0.5)
Eosinophils Relative: 6 %
HCT: 25.1 % — ABNORMAL LOW (ref 39.0–52.0)
Hemoglobin: 8.1 g/dL — ABNORMAL LOW (ref 13.0–17.0)
Immature Granulocytes: 2 %
Lymphocytes Relative: 17 %
Lymphs Abs: 0.5 10*3/uL — ABNORMAL LOW (ref 0.7–4.0)
MCH: 31.6 pg (ref 26.0–34.0)
MCHC: 32.3 g/dL (ref 30.0–36.0)
MCV: 98 fL (ref 80.0–100.0)
Monocytes Absolute: 0.4 10*3/uL (ref 0.1–1.0)
Monocytes Relative: 14 %
Neutro Abs: 1.9 10*3/uL (ref 1.7–7.7)
Neutrophils Relative %: 60 %
Platelets: 145 10*3/uL — ABNORMAL LOW (ref 150–400)
RBC: 2.56 MIL/uL — ABNORMAL LOW (ref 4.22–5.81)
RDW: 19.9 % — ABNORMAL HIGH (ref 11.5–15.5)
WBC: 3 10*3/uL — ABNORMAL LOW (ref 4.0–10.5)
nRBC: 0.7 % — ABNORMAL HIGH (ref 0.0–0.2)

## 2021-11-16 LAB — MAGNESIUM: Magnesium: 2.3 mg/dL (ref 1.7–2.4)

## 2021-11-16 LAB — PHOSPHORUS: Phosphorus: 2.7 mg/dL (ref 2.5–4.6)

## 2021-11-16 LAB — PROTIME-INR
INR: 2.9 — ABNORMAL HIGH (ref 0.8–1.2)
Prothrombin Time: 29.7 seconds — ABNORMAL HIGH (ref 11.4–15.2)

## 2021-11-16 MED ORDER — WARFARIN SODIUM 5 MG PO TABS
5.0000 mg | ORAL_TABLET | Freq: Once | ORAL | Status: AC
  Administered 2021-11-16: 5 mg via ORAL
  Filled 2021-11-16: qty 1

## 2021-11-16 NOTE — Progress Notes (Signed)
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 male with medical history significant for mechanical mitral valve and atrial fibrillation on warfarin, CKD stage IIIb, hypertension, ORIF of distal left femur in March 2022, and cancer of the transverse colon, now presenting to the emergency department with left hip pain after fall.  The patient reports that he was in his usual state when he was using the restroom, believes he became tangled in his pants and briefs, and fell onto his buttock.  He was experiencing pain immediately but initially refused transport to the ED until he was unable to bear weight later in the day.  He denies any recent chest pain or palpitations, denies cough or shortness of breath, and denies hitting his head or losing consciousness when he fell.   ED Course: Upon arrival to the ED, patient is found to be afebrile and saturating well on room air with stable blood pressure.  EKG features atrial fibrillation with QTc interval 539 ms.  CT of the left hip is suggestive of periprosthetic left femur fracture and left posterior buttock/upper thigh hematoma.  Orthopedic surgery was consulted by the ED physician and the patient was treated with fentanyl.      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 Kathaleen Bury via ED note, plan for nonsurgical management Ortho recommending no surgical intervention, LLE 50% partial weightbearing with walker and 1-2 person assit at all times, no active abduction for at least 6 weeks CT L femur with 300 cc mixed density lesion along the L posterior buttock/upper thigh region compatible with hematoma (can't excluded morel lavallee lesion), vertically oriented fracture extending through greater tuberosity and into the anterior cortex of the proximal femoral metaphysis with about 1.5 cm of step off anteriorly, most c/w periprosthetic fracture (suspected small nondisplaed vertical component extending distally in the femoral shafter anteromedially).  Remote mid to distal femoral fx with buttress plate and screw fixation.  Likely chronic anterior cortical defect in the distal femoral metaphysis. F/u in 2 weeks with Dr. Lyla Glassing    Hematoma Suspected hematoma in L posterior buttock/upper thigh Appears relatively stable Ice to L thigh hematoma Will plan to continue warfarin for now, heparin if INR subtherapeutic  Anemia Transfused 1 unit pRBC 5/20 Hb today 8.1 today, stable from yesterday Continue to trend and transfuse for <7 or symptomatic  H/O mitral valve replacement with mechanical valve S/p St. Jude Bileaflet mechanical valve replacement Continue anticoagulation (INR goal 2.5-3.5), bridge if INR subtherapeutic  Left elbow pain Follow plain films -> negative Has foam dressing in place over skin tear Follow with ortho outpatient  Longstanding persistent atrial fibrillation: CHA2DS2-VASc Score 3 Warfarin Bisoprolol   Acute kidney injury superimposed on chronic kidney disease (  [place]) Baseline appears to be 1.7 Bumped to 2 at presentation Improved now to below baseline Will trend  Prolonged QT interval Repeat EKG pending and follow  Essential  hypertension Bisoprolol/hydralazine  Protein-calorie malnutrition, severe (HCC) Noted RD c/s  Decubitus ulcer Pressure Injury 11/12/21 Coccyx Mid Stage 1 -  Intact skin with non-blanchable redness of Richard Spears localized area usually over Richard Spears bony prominence. stage one with pink color (Active)  11/12/21 2100  Location: Coccyx  Location Orientation: Mid  Staging: Stage 1 -  Intact skin with non-blanchable redness of Sherine Cortese localized area usually over Quavion Boule bony prominence.  Wound Description (Comments): stage one with pink color  Present on Admission: Yes         DVT prophylaxis: warfarin Code Status: DNR Family Communication: son 5/21pm over phone Disposition:   Status is: Inpatient Remains inpatient appropriate because: need for orthopedic c/s   Consultants:  ortho  Procedures:  none  Antimicrobials:  Anti-infectives (From admission, onward)    None       Subjective: No new complaints today  Objective: Vitals:   11/15/21 0924 11/15/21 1249 11/15/21 2242 11/16/21 0555  BP: 120/72 129/62 (!) 102/49 119/61  Pulse: 72 72 96 67  Resp:  '14 15 17  '$ Temp:  98 F (36.7 C) 99 F (37.2 C) 98.5 F (36.9 C)  TempSrc:   Oral Oral  SpO2:  96% 97% 97%  Weight:      Height:        Intake/Output Summary (Last 24 hours) at 11/16/2021 0859 Last data filed at 11/16/2021 0807 Gross per 24 hour  Intake 1600 ml  Output 1950 ml  Net -350 ml   Filed Weights   11/12/21 1910  Weight: 63 kg    Examination: General: No acute distress. Cardiovascular: RRR Lungs: unlabored Abdomen: Soft, nontender, nondistended  Neurological: Alert and oriented 3. Moves all extremities 4 . Cranial nerves II through XII grossly intact. Extremities: bruising to L thigh, appears stable - venous stasis changes - no TTP to elbow (foam dressing in place)  Data Reviewed: I have personally reviewed following labs and imaging studies  CBC: Recent Labs  Lab 11/12/21 1707 11/13/21 0347 11/13/21 1104  11/13/21 1722 11/14/21 0331 11/14/21 1210 11/15/21 0306 11/16/21 0333  WBC 4.7 3.0*  --   --  3.0*  --  3.1* 3.0*  NEUTROABS 3.4  --   --   --   --   --   --  1.9  HGB 10.0* 7.9*   < > 8.1* 7.2* 7.4* 7.9* 8.1*  HCT 31.4* 24.6*   < > 25.0* 22.2* 22.7* 24.9* 25.1*  MCV 100.3* 100.0  --   --  100.0  --  98.4 98.0  PLT 161 147*  --   --  134*  --  153 145*   < > = values in this interval not displayed.    Basic Metabolic Panel: Recent Labs  Lab 11/12/21 1707 11/13/21 0347 11/14/21 0331 11/15/21 0306 11/16/21 0333  NA 136 134* 139 138 136  K 4.4 3.8 4.1 4.5 4.6  CL 101 103 108 108 105  CO2 '28 26 26 27 25  '$ GLUCOSE 127* 97 96 112* 126*  BUN 43* 43* 34* 32* 29*  CREATININE 2.05* 1.60* 1.35* 1.30* 1.22  CALCIUM 9.3 8.2* 8.4* 8.5* 8.6*  MG  --  2.3  --   --  2.3  PHOS  --   --   --   --  2.7  GFR: Estimated Creatinine Clearance: 33 mL/min (by C-G formula based on SCr of 1.22 mg/dL).  Liver Function Tests: Recent Labs  Lab 11/16/21 0333  AST 23  ALT 13  ALKPHOS 43  BILITOT 0.9  PROT 5.7*  ALBUMIN 2.7*    CBG: No results for input(s): GLUCAP in the last 168 hours.   Recent Results (from the past 240 hour(s))  Surgical pcr screen     Status: None   Collection Time: 11/13/21  1:29 AM   Specimen: Nasal Mucosa; Nasal Swab  Result Value Ref Range Status   MRSA, PCR NEGATIVE NEGATIVE Final   Staphylococcus aureus NEGATIVE NEGATIVE Final    Comment: (NOTE) The Xpert SA Assay (FDA approved for NASAL specimens in patients 16 years of age and older), is one component of Suzetta Timko comprehensive surveillance program. It is not intended to diagnose infection nor to guide or monitor treatment. Performed at [facility], [place] [address] [place], [place] [address]          Radiology Studies: DG Elbow 2 Views Left  Result Date: 11/15/2021 CLINICAL DATA:  Fall 2 days ago with left elbow pain. EXAM: LEFT ELBOW - 2 VIEW COMPARISON:  None Available. FINDINGS:  There is no evidence of fracture, dislocation, or joint effusion. Mild degenerative changes are seen in the elbow joint. Soft tissues are unremarkable. IMPRESSION: No acute osseous injury.  No joint effusion. Electronically Signed   By: Zerita Boers M.D.   On: 11/15/2021 14:07        Scheduled Meds:  bisoprolol  5 mg Oral Daily   feeding supplement  237 mL Oral TID BM   hydrALAZINE  25 mg Oral Daily   isosorbide mononitrate  30 mg Oral Daily   multivitamin with minerals  1 tablet Oral Daily   Warfarin - Pharmacist Dosing Inpatient   Does not apply q1600   Continuous Infusions:   LOS: 4 days    Time spent: over 30 min    Fayrene Helper, MD [facility]   To contact the attending provider between 7A-7P or the covering provider during after hours 7P-7A, please log into the web site [email or web address] and access using universal [place] password for that web site. If you do not have the password, please call the hospital operator.  11/16/2021, 8:59 AM

## 2021-11-16 NOTE — Progress Notes (Addendum)
HEMATOLOGY-ONCOLOGY PROGRESS NOTE  ASSESSMENT AND PLAN: 1.  Periprosthetic fracture around the anterior prosthetic hip joint 2.  Hematoma left posterior buttock/upper thigh 3.  Stage III colon cancer 4.  Anemia 5.  Mild leukopenia 6.  Mild thrombocytopenia 7.  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  Atrial fibrillation 9.  CKD 10.  Hypertension  -The patient sustained a fracture following a fall.  Has been seen by Ortho who recommends against surgery.  He will go to rehab at SNF likely tomorrow.  Outpatient follow-up recommended by Ortho. -The patient has been receiving pembrolizumab for his colon cancer.  He was due to receive it again this week.  We will cancel this appointment to allow him time to recover from his fracture.  He has an outpatient appointment already scheduled on 6/16 for his next cycle of pembrolizumab and I have kept this appointment. -The patient is more anemic than he has been in the past.  Likely due to to hematoma from fall.  Monitor.  Transfuse for hemoglobin less than 8. -Management of other medical conditions per hospitalist.  Mikey Bussing, DNP, AGPCNP-BC, AOCNP  SUBJECTIVE: Mr. Deren is followed by our office for stage IIIa colon cancer.  He has been receiving pembrolizumab.  Now admitted to the hospital following a fall.  CT of the left hip suggestive of periprosthetic left femur fracture and left posterior buttock/upper thigh hematoma.  He was seen by orthopedics who recommended against surgical intervention.  He will follow-up with Ortho as an outpatient in about 2 weeks.  Today, the patient reports that his pain is well controlled.  He states that he is going to [facility] for SNF.  He otherwise has no other complaints today.  Oncology History Overview Note   Cancer Staging  Cancer of transverse colon  Staging form: Colon and Rectum, AJCC 8th Edition - Pathologic stage from 06/30/2021: Stage I (pT2,  pN0, cM0) - Signed by [name], MD on 07/03/2021    Cancer of transverse colon   06/24/2021 Imaging   EXAM: CTA ABDOMEN AND PELVIS WITHOUT AND WITH CONTRAST  MPRESSION: 1. Avid arterial phase enhancing masslike filling defect within the mid transverse colon is identified. Although conceivably this could represent a large blood clot the diagnosis of exclusion is a primary colonic neoplasm. Further evaluation with colonoscopy is recommended. 2. Extensive atherosclerotic disease noted with stenosis noted at the origin of the celiac artery, SMA, IMA and both renal arteries. 3. 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 4. Large left inguinal hernia contains a nonobstructed loop of large bowel. 5. Aortic Atherosclerosis (ICD10-I70.0).   06/27/2021 Procedure   Colonoscopy, Dr. Watt Climes  Impression: - Likely malignant partially obstructing tumor in the mid transverse colon. Biopsied. - The examination was otherwise normal on direct and retroflexion views. Unable to advance the scope past the tumor   06/27/2021 Initial Biopsy   FINAL MICROSCOPIC DIAGNOSIS:   A. TRANSVERSE COLON, BIOPSY:  - Poorly differentiated adenocarcinoma, see comment    ADDENDUM:  Mismatch Repair Protein (IHC)  SUMMARY INTERPRETATION: ABNORMAL   IHC EXPRESSION RESULTS   TEST           RESULT  MLH1:          LOSS OF NUCLEAR EXPRESSION  MSH2:          Preserved nuclear expression  MSH6:          Preserved nuclear expression  PMS2:  LOSS OF NUCLEAR EXPRESSION    06/30/2021 Cancer Staging   Staging form: Colon and Rectum, AJCC 8th Edition - Pathologic stage from 06/30/2021: Stage IIIA (pT2, pN1c, cM0) - Signed by [name], MD on 10/07/2021 Stage prefix: Initial diagnosis Total positive nodes: 0 Residual tumor (R): R0 - None    06/30/2021 Definitive Surgery   FINAL MICROSCOPIC DIAGNOSIS:   A. COLON, TRANSVERSE, PARTIAL COLECTOMY:  Poorly differentiated adenocarcinoma  invading muscularis propria.  See oncology table for details.    07/02/2021 Imaging   EXAM: CT CHEST WITHOUT CONTRAST  IMPRESSION: 1. No evidence of metastatic disease in the thorax. 2. Trace bilateral pleural effusions and associated atelectasis. 3. Multi chamber cardiomegaly. Coronary artery calcifications. Prominence of the central pulmonary artery suggesting pulmonary arterial hypertension.   07/03/2021 Initial Diagnosis   Cancer of transverse colon ([place])    10/07/2021 -  Chemotherapy   Patient is on Treatment Plan : COLORECTAL Pembrolizumab (200) q21d      10/16/2021 PET scan   IMPRESSION: 1. Intense multifocal hypermetabolic nodal metastases within the gastrohepatic ligament and upper retroperitoneum. This nodal disease appears mildly progressive compared with CT of 4 weeks ago. 2. No definite hypermetabolic activity at the colonic anastomosis as suggested on previous CT. 3. No hepatic or other definite distant metastases identified. Low-level hilar activity bilaterally is nonspecific, but likely reactive. 4. Recent compression deformities at T10 and T11 with mild hypermetabolic activity.      REVIEW OF SYSTEMS:   Review of Systems  Constitutional: Negative.   HENT: Negative.    Eyes: Negative.   Respiratory: Negative.    Cardiovascular: Negative.   Musculoskeletal:        Hip pain well controlled  Skin: Negative.   Neurological: Negative.   Psychiatric/Behavioral: Negative.     I have reviewed the past medical history, past surgical history, social history and family history with the patient and they are unchanged from previous note.   PHYSICAL EXAMINATION: ECOG PERFORMANCE STATUS: 2 - Symptomatic, <50% confined to bed  Vitals:   11/15/21 2242 11/16/21 0555  BP: (!) 102/49 119/61  Pulse: 96 67  Resp: 15 17  Temp: 99 F (37.2 C) 98.5 F (36.9 C)  SpO2: 97% 97%   Filed Weights   11/12/21 1910  Weight: 63 kg    Intake/Output from previous day: 05/21  0701 - 05/22 0700 In: 1500 [P.O.:1500] Out: 1850 [Urine:1850]  Physical Exam Vitals reviewed.  Constitutional:      General: He is not in acute distress. HENT:     Head: Normocephalic.  Eyes:     General: No scleral icterus.    Conjunctiva/sclera: Conjunctivae normal.  Musculoskeletal:     Comments: Ecchymosis left thigh  Skin:    General: Skin is warm and dry.  Neurological:     Mental Status: He is alert and oriented to person, place, and time.    LABORATORY DATA:  I have reviewed the data as listed    Latest Ref Rng & Units 11/16/2021    3:33 AM 11/15/2021    3:06 AM 11/14/2021    3:31 AM  CMP  Glucose 70 - 99 mg/dL 126   112   96    BUN 8 - 23 mg/dL 29   32   34    Creatinine 0.61 - 1.24 mg/dL 1.22   1.30   1.35    Sodium 135 - 145 mmol/L 136   138   139    Potassium 3.5 - 5.1 mmol/L  4.6   4.5   4.1    Chloride 98 - 111 mmol/L 105   108   108    CO2 22 - 32 mmol/L _0 Calcium 8.9 - 10.3 mg/dL 8.6   8.5   8.4    Total Protein 6.5 - 8.1 g/dL 5.7      Total Bilirubin 0.3 - 1.2 mg/dL 0.9      Alkaline Phos 38 - 126 U/L 43      AST 15 - 41 U/L 23      ALT 0 - 44 U/L 13        Lab Results  Component Value Date   WBC 3.0 (L) 11/16/2021   HGB 8.1 (L) 11/16/2021   HCT 25.1 (L) 11/16/2021   MCV 98.0 11/16/2021   PLT 145 (L) 11/16/2021   NEUTROABS 1.9 11/16/2021    Lab Results  Component Value Date   CEA1 7.03 (H) 10/29/2021    DG Chest 1 View  Result Date: 11/12/2021 CLINICAL DATA:  Fall. EXAM: CHEST  1 VIEW COMPARISON:  PET-CT 10/16/2021 FINDINGS: Cardiomegaly. Atherosclerotic calcification of the aortic arch. Prosthetic mitral valve. Prior median sternotomy. Hazy opacities along the mid lungs are probably from skin folds. Old healed right rib fractures. IMPRESSION: 1. Stable cardiomegaly. 2. Hazy densities along the mid lungs bilaterally, probably skin folds. 3.  Aortic Atherosclerosis (ICD10-I70.0). Electronically Signed   By: Van Clines M.D.    On: 11/12/2021 18:42   DG Elbow 2 Views Left  Result Date: 11/15/2021 CLINICAL DATA:  Fall 2 days ago with left elbow pain. EXAM: LEFT ELBOW - 2 VIEW COMPARISON:  None Available. FINDINGS: There is no evidence of fracture, dislocation, or joint effusion. Mild degenerative changes are seen in the elbow joint. Soft tissues are unremarkable. IMPRESSION: No acute osseous injury.  No joint effusion. Electronically Signed   By: Zerita Boers M.D.   On: 11/15/2021 14:07   CT Head Wo Contrast  Result Date: 11/12/2021 CLINICAL DATA:  Fall. EXAM: CT HEAD WITHOUT CONTRAST CT CERVICAL SPINE WITHOUT CONTRAST TECHNIQUE: Multidetector CT imaging of the head and cervical spine was performed following the standard protocol without intravenous contrast. Multiplanar CT image reconstructions of the cervical spine were also generated. RADIATION DOSE REDUCTION: This exam was performed according to the departmental dose-optimization program which includes automated exposure control, adjustment of the mA and/or kV according to patient size and/or use of iterative reconstruction technique. COMPARISON:  None Available. FINDINGS: CT HEAD FINDINGS Brain: No evidence of acute infarction, hemorrhage, hydrocephalus, extra-axial collection or mass lesion/mass effect. There is moderate diffuse atrophy. There is mild periventricular white matter hypodensity, likely chronic small vessel ischemic change. Vascular: Atherosclerotic calcifications are present within the cavernous internal carotid arteries. Skull: Normal. Negative for fracture or focal lesion. Sinuses/Orbits: No acute finding. Other: None. CT CERVICAL SPINE FINDINGS Alignment: There is trace anterolisthesis at C6-C7 which is likely degenerative. Alignment is otherwise anatomic. Skull base and vertebrae: Bones are osteopenic. No acute fracture. No primary bone lesion or focal pathologic process. Soft tissues and spinal canal: No prevertebral fluid or swelling. No visible canal  hematoma. Disc levels: There is severe disc space narrowing at C3-C4, C4-C5 and C5-C6. No significant central canal or neural foraminal stenosis at any level. Upper chest: Negative. Other: None. IMPRESSION: 1.  No acute intracranial process. 2. No acute fracture or traumatic subluxation of the cervical spine. 3. Moderate diffuse brain atrophy. Mild chronic small vessel ischemic  change. 4.  Degenerative changes of the cervical spine as above. Electronically Signed   By: Ronney Asters M.D.   On: 11/12/2021 17:52   CT Cervical Spine Wo Contrast  Result Date: 11/12/2021 CLINICAL DATA:  Fall. EXAM: CT HEAD WITHOUT CONTRAST CT CERVICAL SPINE WITHOUT CONTRAST TECHNIQUE: Multidetector CT imaging of the head and cervical spine was performed following the standard protocol without intravenous contrast. Multiplanar CT image reconstructions of the cervical spine were also generated. RADIATION DOSE REDUCTION: This exam was performed according to the departmental dose-optimization program which includes automated exposure control, adjustment of the mA and/or kV according to patient size and/or use of iterative reconstruction technique. COMPARISON:  None Available. FINDINGS: CT HEAD FINDINGS Brain: No evidence of acute infarction, hemorrhage, hydrocephalus, extra-axial collection or mass lesion/mass effect. There is moderate diffuse atrophy. There is mild periventricular white matter hypodensity, likely chronic small vessel ischemic change. Vascular: Atherosclerotic calcifications are present within the cavernous internal carotid arteries. Skull: Normal. Negative for fracture or focal lesion. Sinuses/Orbits: No acute finding. Other: None. CT CERVICAL SPINE FINDINGS Alignment: There is trace anterolisthesis at C6-C7 which is likely degenerative. Alignment is otherwise anatomic. Skull base and vertebrae: Bones are osteopenic. No acute fracture. No primary bone lesion or focal pathologic process. Soft tissues and spinal canal: No  prevertebral fluid or swelling. No visible canal hematoma. Disc levels: There is severe disc space narrowing at C3-C4, C4-C5 and C5-C6. No significant central canal or neural foraminal stenosis at any level. Upper chest: Negative. Other: None. IMPRESSION: 1.  No acute intracranial process. 2. No acute fracture or traumatic subluxation of the cervical spine. 3. Moderate diffuse brain atrophy. Mild chronic small vessel ischemic change. 4.  Degenerative changes of the cervical spine as above. Electronically Signed   By: Ronney Asters M.D.   On: 11/12/2021 17:52   CT FEMUR LEFT WO CONTRAST  Result Date: 11/12/2021 CLINICAL DATA:  Trauma, fall, possible fracture. Unable to bear weight. EXAM: CT OF THE LOWER LEFT EXTREMITY WITHOUT CONTRAST TECHNIQUE: Multidetector CT imaging of the lower left extremity was performed according to the standard protocol. RADIATION DOSE REDUCTION: This exam was performed according to the departmental dose-optimization program which includes automated exposure control, adjustment of the mA and/or kV according to patient size and/or use of iterative reconstruction technique. COMPARISON:  Radiographs 08/28/2020 FINDINGS: Bones/Joint/Cartilage Streak artifact associated with the left total hip prosthesis left lateral buttress plate and screw fixator noted. Old healed left inferior pubic ramus fracture, image 72 series 2. Left femur proximal periprosthetic fracture involving the greater trochanter and anterolateral left proximal femoral metaphysis, with about 1.2 cm of cortical step-off anteriorly on image 74 series 4. Suspected vertical nondisplaced component of this fracture extending distally in the femoral shaft anteromedial to the stem of the prosthesis for example on image 126 of series 2. This becomes indistinct distally. There is a previous oblique fracture of the femoral diaphysis with healing response noted along the upper fracture margins for example on image 184 of series 2,  traversed by the buttress plate and with cerclage is in the vicinity of the fracture. On image 219 of series 2, there is a 1.8 by 3.5 cm cortical defect in the anterior femoral shaft which is probably chronic. Osteoarthritis of the knee is present. Ligaments Suboptimally assessed by CT. Muscles and Tendons Unremarkable Soft tissues Indirect left inguinal hernia contains loop of bowel without strangulation or obstruction. Substantial atherosclerosis. 7.9 by 5.1 by 14.4 cm (volume = 300 cm^3) mixed density subcutaneous structure  along the left buttock and upper thigh region favoring hematoma. This partially abuts the superficial fascia margin. There is substantial surrounding subcutaneous edema along the buttock and tracking in the left lateral thigh. IMPRESSION: 1. 300 cc mixed density subcutaneous lesion along the left posterior buttock/upper thigh region compatible with hematoma. Cannot completely exclude Sherry Ruffing lesion as this abuts the superficial fascia margin. Surrounding subcutaneous edema noted. 2. Primarily vertically oriented fracture extending through the greater tuberosity and into the anterior cortex of the proximal femoral metaphysis, with about 1.5 cm of step-off anteriorly, most compatible with a periprosthetic fracture. Suspected small nondisplaced vertical component extending distally in the femoral shaft anteromedially. 3. Remote mid to distal femoral fracture with buttress plate and screw fixation. Likely chronic anterior cortical defect in the distal femoral metaphysis. 4. Left inguinal hernia contains bowel. 5. Old left inferior pubic ramus fracture. Electronically Signed   By: Van Clines M.D.   On: 11/12/2021 18:06   DG Knee Complete 4 Views Left  Result Date: 11/12/2021 CLINICAL DATA:  Fall EXAM: LEFT KNEE - COMPLETE 4+ VIEW COMPARISON:  CT scan from 11/12/2021 FINDINGS: Bony demineralization. Distal femoral buttress plate laterally with cerclage fixation along the site of  the prior oblique shaft fracture. Articular space narrowing in the knee especially in the medial compartment. Trace knee effusion. Substantial spurring in the patellofemoral joint. Distal SFA and popliteal artery atherosclerotic calcification. IMPRESSION: 1. Substantial osteoarthritis of the knee.  Small knee effusion. 2. Postoperative findings in the distal femur. 3. Atherosclerosis. Electronically Signed   By: Van Clines M.D.   On: 11/12/2021 18:40   DG Hip Unilat With Pelvis 2-3 Views Left  Result Date: 11/12/2021 CLINICAL DATA:  Fall. EXAM: DG HIP (WITH OR WITHOUT PELVIS) 2-3V LEFT COMPARISON:  Left hip x-ray 08/28/2020 FINDINGS: Bones are diffusely osteopenic. Left hip arthroplasty is again seen in anatomic alignment. There is a lateral mid femoral sideplate, partially imaged. There is no evidence for hardware loosening. There is an acute oblique fracture through the lateral aspect of the proximal left femur just inferior to the intratrochanteric region. Fracture fragment is distracted 1 cm. There is a healed left inferior pubic ramus fracture. Right hip arthroplasty appears uncomplicated. IMPRESSION: 1. Acute fracture through the proximal left femur just inferior to the intratrochanteric region. 2. Left hip arthroplasty and femoral sideplate appear in anatomic alignment. Electronically Signed   By: Ronney Asters M.D.   On: 11/12/2021 18:41     Future Appointments  Date Time Provider [place]  11/17/2021 10:20 AM [name], MD [facility] None  11/30/2021  2:00 PM [facility]  12/11/2021 12:30 PM [facility] None  12/11/2021  1:15 PM [facility] LAB [facility] None  12/11/2021  1:45 PM Causey, Charlestine Massed, NP [facility] None  12/11/2021  2:45 PM [facility] INFUSION [facility] None      LOS: 4 days   Addendum I have seen the patient, examined him. I agree with the assessment and and plan and have edited the notes.   Patient  is well-known to me, under my care for his metastatic colon cancer.  He is on immunotherapy.  I will cancel his treatment tomorrow, and see him back in 3 weeks as scheduled next infusion, he will call me if he is not ready to return in 3 weeks.  He is not having much symptoms from his metastatic colon cancer or side effect from [facility] treatment.  OK to hold his cancer treatment for sometime. We also discussed  risk of thrombosis from cancer and immobility, he is on Coumadin which will prevent thrombosis.  [name]  11/16/2021

## 2021-11-16 NOTE — Progress Notes (Signed)
[facility] for warfarin Indication:  mechanical mitral valve  Allergies  Allergen Reactions   Flexeril [Cyclobenzaprine] Diarrhea    Patient Measurements: Height: 6' (182.9 cm) Weight: 63 kg (138 lb 14.2 oz) IBW/kg (Calculated) : 77.6  Vital Signs: Temp: 98.5 F (36.9 C) (05/22 0555) Temp Source: Oral (05/22 0555) BP: 119/61 (05/22 0555) Pulse Rate: 67 (05/22 0555)  Labs: Recent Labs    11/14/21 0331 11/14/21 1210 11/15/21 0306 11/16/21 0333  HGB 7.2* 7.4* 7.9* 8.1*  HCT 22.2* 22.7* 24.9* 25.1*  PLT 134*  --  153 145*  LABPROT 30.1*  --  33.5* 29.7*  INR 2.9*  --  3.3* 2.9*  CREATININE 1.35*  --  1.30* 1.22     Estimated Creatinine Clearance: 33 mL/min (by C-G formula based on SCr of 1.22 mg/dL).   Medications: Pt is on warfarin PTA -Home dose (confirmed with [facility] clinic note on 11/02/21): Warfarin 2.5 mg daily except 5 mg MWF (INR goal: 2.5 - 3.5) -Last dose PTA: 5/18 ~8 am  Assessment: Pt is a 52 yoM presenting with left hip pain s/p fall. PMH significant for ORIF of distal left femur in March 2022, s/p partial colectomy in Jan 2023. Patient has atrial fibrillation and a mechanical mitral valve for which he is chronically on warfarin. Pharmacy consulted to manage while inpatient.   Today, 11/16/2021: - INR remains therapeutic at 2.9 - CBC: Hgb and plts low but somewhat stable - Suspected hematoma in L posterior buttock/upper thigh - Diet: Regular; eating 50-100% of most meals - No major DDI   Goal of Therapy:  INR 2.5 - 3.5  Plan:  - Warfarin 5 mg PO x 1 (PTA dosing) - INR daily - Monitor for signs of bleeding or worsening hematoma - Given mechanical valve, if INR becomes subtherapeutic (or patient ends up needing surgical intervention), will need to be bridged with parenteral anticoagulation  Lynelle Doctor, PharmD 11/16/2021,12:46 PM

## 2021-11-16 NOTE — Care Management Important Message (Signed)
Important Message  Patient Details IM Letter placed in Patients room. Name: Richard Spears MRN: [id number] Date of Birth: 1926/02/23   Medicare Important Message Given:  Yes     Kerin Salen 11/16/2021, 2:46 PM

## 2021-11-16 NOTE — TOC Progression Note (Addendum)
Transition of Care [facility]) - Progression Note   Patient Details  Name: Richard Spears MRN: [id number] Date of Birth: 09-12-1925  Transition of Care [facility]) CM/SW [place], LCSW Phone Number: 11/16/2021, 2:27 PM  Clinical Narrative: PT evaluation recommended SNF and patient is from [place]. CSW spoke with Jordan at [facility] and all Medicare certified rehab beds have been moved to [facility].  CSW spoke with Joellen Jersey in admissions at [facility] and the SNF does not have any beds available, so patient would either need to go to another facility or return to his ILF with PT onsite until a rehab bed becomes available within 30 days of patient's discharge. Patient prefers to go to another rehab as he stated he cannot return to the apartment in his current condition.  FL2 done; PASRR verified. Initial referral faxed out. TOC awaiting bed offers.  Addendum: CSW spoke with patient's son, [name], and the family has decided to private pay for a non-Medicare bed at [facility] until a Medicare bed is available at [facility]. Patient updated. FL2 faxed to [facility]  Expected Discharge Plan: Home/Self Care Barriers to Discharge: Continued Medical Work up  Expected Discharge Plan and Services Expected Discharge Plan: Home/Self Care In-house Referral: Clinical Social Work Living arrangements for the past 2 months: [place]  Readmission Risk Interventions    11/13/2021   12:17 PM  Readmission Risk Prevention Plan  Transportation Screening Complete  HRI or [place] Complete  Social Work Consult for [place] Planning/Counseling Complete  Palliative Care Screening Not Applicable

## 2021-11-16 NOTE — [place] FL2 (Signed)
[place] MEDICAID FL2 LEVEL OF CARE SCREENING TOOL     IDENTIFICATION  Patient Name: Richard Spears Birthdate: 12-29-1925 Sex: male Admission Date (Current Location): 11/12/2021  [facility] and [place] Number:  Herbalist and Address:  [facility],  [place], [place]      Provider Number: [id number]  Attending Physician Name and Address:  Elodia Florence., *  Relative Name and Phone Number:  Shakeem Stern (wife) Ph: [phone]    Current Level of Care: Hospital Recommended Level of Care: [place] Prior Approval Number:    Date Approved/Denied:   PASRR Number: [id number]  Discharge Plan: SNF    Current Diagnoses: Patient Active Problem List   Diagnosis Date Noted   Left elbow pain 11/15/2021   Protein-calorie malnutrition, severe ([place]) 11/13/2021   Decubitus ulcer 11/13/2021   Hematoma of left hip    Closed fracture of proximal end of left femur, initial encounter ([place]) 11/12/2021   Prolonged QT interval 11/12/2021   Traumatic hematoma of buttock 11/12/2021   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, initial encounter 11/12/2021   Superficial vein thrombosis 07/15/2021   Cancer of transverse colon  07/03/2021   Acute blood loss anemia 02/03/2021   Hematoma 12/23/2020   Acute kidney injury superimposed on chronic kidney disease ([place]) 12/23/2020   Pressure ulcer of right buttock, stage 2 ([place]) 10/07/2020   Weight loss 09/23/2020   Insomnia 09/23/2020   Dysuria 09/23/2020   BPH (benign prostatic hyperplasia) 09/03/2020   Generalized osteoarthritis of multiple sites 09/03/2020   Slow transit constipation 08/30/2020   AKI (acute kidney injury) ([place]) 08/27/2020   Closed displaced fracture of distal epiphysis of left femur ([place]) 08/25/2020   Anemia 08/25/2020   Fall 08/25/2020   Alcohol use 08/25/2020   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[place]) 01/21/2018   Hip fracture ([place]) 10/09/2017    Essential hypertension 05/12/2017   Exertional dyspnea 01/18/2016   Venous insufficiency 02/14/2015   Cancer of skin of right lower leg 10/31/2014   AI (aortic insufficiency) 01/23/2014   Longstanding persistent atrial fibrillation: CHA2DS2-VASc Score 3 01/17/2014   Hyperlipidemia    H/O mitral valve replacement with mechanical valve 04/08/1996    Orientation RESPIRATION BLADDER Height & Weight     Self, Time, Situation, Place  Normal Continent Weight: 138 lb 14.2 oz (63 kg) Height:  6' (182.9 cm)  BEHAVIORAL SYMPTOMS/MOOD NEUROLOGICAL BOWEL NUTRITION STATUS   (N/A)  (N/A) Continent Diet (Regular diet)  AMBULATORY STATUS COMMUNICATION OF NEEDS Skin   Limited Assist Verbally Skin abrasions, Other (Comment) (Scratch & rash: right arm & left chest; Ecchymosis: arm, leg, & buttocks; Abrasion: left elbow)                       Personal Care Assistance Level of Assistance  Bathing, Feeding, Dressing Bathing Assistance: Limited assistance Feeding assistance: Independent Dressing Assistance: Limited assistance     Functional Limitations Info  Sight, Hearing, Speech Sight Info: Impaired Hearing Info: Adequate Speech Info: Adequate    SPECIAL CARE FACTORS FREQUENCY  PT (By licensed PT), OT (By licensed OT)     PT Frequency: 5x's/week OT Frequency: 5x's/week            Contractures Contractures Info: Not present    Additional Factors Info  Code Status, Allergies, Psychotropic Code Status Info: DNR Allergies Info: Flexeril (Cyclobenzaprine) Psychotropic Info: Ativan         Current Medications (11/16/2021):  This is the  current hospital active medication list Current Facility-Administered Medications  Medication Dose Route Frequency Provider Last Rate Last Admin   acetaminophen (TYLENOL) tablet 650 mg  650 mg Oral Q6H PRN Rise Patience, MD   650 mg at 11/16/21 0830   bisoprolol (ZEBETA) tablet 5 mg  5 mg Oral Daily Opyd, Ilene Qua, MD   5 mg at 11/16/21 1022    feeding supplement (ENSURE ENLIVE / ENSURE PLUS) liquid 237 mL  237 mL Oral TID BM Elodia Florence., MD   237 mL at 11/16/21 1026   fentaNYL (SUBLIMAZE) injection 12.5-25 mcg  12.5-25 mcg Intravenous Q2H PRN Opyd, Ilene Qua, MD       hydrALAZINE (APRESOLINE) tablet 25 mg  25 mg Oral Daily Opyd, Ilene Qua, MD   25 mg at 11/15/21 0354   isosorbide mononitrate (IMDUR) 24 hr tablet 30 mg  30 mg Oral Daily Opyd, Ilene Qua, MD   30 mg at 11/15/21 6568   LORazepam (ATIVAN) tablet 1 mg  1 mg Oral QHS PRN Opyd, Ilene Qua, MD   1 mg at 11/14/21 2209   multivitamin with minerals tablet 1 tablet  1 tablet Oral Daily Elodia Florence., MD   1 tablet at 11/16/21 1022   oxyCODONE (Oxy IR/ROXICODONE) immediate release tablet 5-10 mg  5-10 mg Oral Q6H PRN Opyd, Ilene Qua, MD   5 mg at 11/16/21 0830   senna-docusate (Senokot-S) tablet 1 tablet  1 tablet Oral QHS PRN Opyd, Ilene Qua, MD       warfarin (COUMADIN) tablet 5 mg  5 mg Oral ONCE-1600 Pham, Anh P, RPH       Warfarin - Pharmacist Dosing Inpatient   Does not apply [id number] Lenis Noon, [facility]   1 each at 11/14/21 1608     Discharge Medications: Please see discharge summary for a list of discharge medications.  Relevant Imaging Results:  Relevant Lab Results:   Additional Information SSN: [id number] [facility] COVID-19 Vaccine 05/12/2020, 07/30/2019, 07/02/2019  Moderna Covid-19 Booster Vaccine 12/03/2020)  Sherie Don, LCSW

## 2021-11-17 ENCOUNTER — Inpatient Hospital Stay: Payer: Medicare Other

## 2021-11-17 ENCOUNTER — Inpatient Hospital Stay: Payer: Medicare Other | Admitting: Hematology

## 2021-11-17 DIAGNOSIS — M978XXA Periprosthetic fracture around other internal prosthetic joint, initial encounter: Secondary | ICD-10-CM | POA: Diagnosis not present

## 2021-11-17 DIAGNOSIS — Z85038 Personal history of other malignant neoplasm of large intestine: Secondary | ICD-10-CM

## 2021-11-17 DIAGNOSIS — Z96649 Presence of unspecified artificial hip joint: Secondary | ICD-10-CM | POA: Diagnosis not present

## 2021-11-17 LAB — COMPREHENSIVE METABOLIC PANEL
ALT: 16 U/L (ref 0–44)
AST: 26 U/L (ref 15–41)
Albumin: 2.7 g/dL — ABNORMAL LOW (ref 3.5–5.0)
Alkaline Phosphatase: 44 U/L (ref 38–126)
Anion gap: 4 — ABNORMAL LOW (ref 5–15)
BUN: 34 mg/dL — ABNORMAL HIGH (ref 8–23)
CO2: 25 mmol/L (ref 22–32)
Calcium: 8.6 mg/dL — ABNORMAL LOW (ref 8.9–10.3)
Chloride: 106 mmol/L (ref 98–111)
Creatinine, Ser: 1.2 mg/dL (ref 0.61–1.24)
GFR, Estimated: 56 mL/min — ABNORMAL LOW (ref >60)
Glucose, Bld: 97 mg/dL (ref 70–99)
Potassium: 4.9 mmol/L (ref 3.5–5.1)
Sodium: 135 mmol/L (ref 135–145)
Total Bilirubin: 0.8 mg/dL (ref 0.3–1.2)
Total Protein: 5.9 g/dL — ABNORMAL LOW (ref 6.5–8.1)

## 2021-11-17 LAB — CBC WITH DIFFERENTIAL/PLATELET
Abs Immature Granulocytes: 0.07 10*3/uL (ref 0.00–0.07)
Basophils Absolute: 0 10*3/uL (ref 0.0–0.1)
Basophils Relative: 1 %
Eosinophils Absolute: 0.3 10*3/uL (ref 0.0–0.5)
Eosinophils Relative: 7 %
HCT: 26.3 % — ABNORMAL LOW (ref 39.0–52.0)
Hemoglobin: 8.2 g/dL — ABNORMAL LOW (ref 13.0–17.0)
Immature Granulocytes: 2 %
Lymphocytes Relative: 15 %
Lymphs Abs: 0.5 10*3/uL — ABNORMAL LOW (ref 0.7–4.0)
MCH: 30.5 pg (ref 26.0–34.0)
MCHC: 31.2 g/dL (ref 30.0–36.0)
MCV: 97.8 fL (ref 80.0–100.0)
Monocytes Absolute: 0.4 10*3/uL (ref 0.1–1.0)
Monocytes Relative: 12 %
Neutro Abs: 2.2 10*3/uL (ref 1.7–7.7)
Neutrophils Relative %: 63 %
Platelets: 154 10*3/uL (ref 150–400)
RBC: 2.69 MIL/uL — ABNORMAL LOW (ref 4.22–5.81)
RDW: 19.8 % — ABNORMAL HIGH (ref 11.5–15.5)
WBC: 3.5 10*3/uL — ABNORMAL LOW (ref 4.0–10.5)
nRBC: 0.6 % — ABNORMAL HIGH (ref 0.0–0.2)

## 2021-11-17 LAB — PROTIME-INR
INR: 2.8 — ABNORMAL HIGH (ref 0.8–1.2)
Prothrombin Time: 29 seconds — ABNORMAL HIGH (ref 11.4–15.2)

## 2021-11-17 LAB — PHOSPHORUS: Phosphorus: 2.7 mg/dL (ref 2.5–4.6)

## 2021-11-17 LAB — MAGNESIUM: Magnesium: 2.2 mg/dL (ref 1.7–2.4)

## 2021-11-17 MED ORDER — WARFARIN SODIUM 5 MG PO TABS: 5.0000 mg | ORAL_TABLET | Freq: Once | ORAL | Status: DC

## 2021-11-17 MED ORDER — FERROUS SULFATE 325 (65 FE) MG PO TABS: 325.0000 mg | ORAL_TABLET | Freq: Every day | ORAL | 3 refills | Status: AC

## 2021-11-17 MED ORDER — OXYCODONE HCL 5 MG PO TABS: 2.5000 mg | ORAL_TABLET | Freq: Three times a day (TID) | ORAL | 0 refills | Status: AC | PRN

## 2021-11-17 NOTE — Progress Notes (Signed)
Richard Spears for warfarin Indication:  mechanical mitral valve  Allergies  Allergen Reactions   Flexeril [Cyclobenzaprine] Diarrhea    Patient Measurements: Height: 6' (182.9 cm) Weight: 63 kg (138 lb 14.2 oz) IBW/kg (Calculated) : 77.6  Vital Signs: Temp: 97.7 F (36.5 C) (05/23 0939) Temp Source: Oral (05/23 0939) BP: 118/55 (05/23 0939) Pulse Rate: 89 (05/23 0939)  Labs: Recent Labs    11/15/21 0306 11/16/21 0333 11/17/21 0311  HGB 7.9* 8.1* 8.2*  HCT 24.9* 25.1* 26.3*  PLT 153 145* 154  LABPROT 33.5* 29.7* 29.0*  INR 3.3* 2.9* 2.8*  CREATININE 1.30* 1.22 1.20     Estimated Creatinine Clearance: 33.5 mL/min (by C-G formula based on SCr of 1.2 mg/dL).   Medications: Pt is on warfarin PTA -Home dose (confirmed with [facility] clinic note on 11/02/21): Warfarin 2.5 mg daily except 5 mg MWF (INR goal: 2.5 - 3.5) -Last dose PTA: 5/18 ~8 am  Assessment: Pt is a 31 yoM presenting with left hip pain s/p fall. PMH significant for ORIF of distal left femur in March 2022, s/p partial colectomy in Jan 2023. Patient has atrial fibrillation and a mechanical mitral valve for which he is chronically on warfarin. Pharmacy consulted to manage while inpatient.   Today, 11/17/2021: - INR remains therapeutic at 2.8 but trending down - CBC: Hgb and plts low but somewhat stable - Suspected hematoma in L posterior buttock/upper thigh - Diet: Regular; eating 50-100% of most meals - No major DDI   Goal of Therapy:  INR 2.5 - 3.5  Plan:  - Warfarin 5 mg PO x 1 if patient is not discharged by 4pm today - INR daily - Monitor for signs of bleeding or worsening hematoma  Lynelle Doctor, PharmD 11/17/2021,1:16 PM

## 2021-11-17 NOTE — TOC Transition Note (Signed)
Transition of Care [facility]) - CM/SW Discharge Note  Patient Details  Name: Richard Spears MRN: [id number] Date of Birth: 23-Jan-1926  [facility]) CM/SW Contact:  Sherie Don, LCSW Phone Number: 11/17/2021, 11:12 AM  Clinical Narrative: Patient is stable for discharge to [place] confirmed discharge with [place] at [facility]. Discharge summary, discharge orders, SNF transfer report, and FL2 faxed to facility in hub. The number for report is [phone] ext. [phone]. Medical necessity form done; PTAR scheduled. Discharge packet complete. Patient notified of discharge. RN updated. TOC signing off.  Final next level of care: Skilled Nursing Facility Barriers to Discharge: Barriers Resolved  Patient Goals and CMS Choice Patient states their goals for this hospitalization and ongoing recovery are:: Discharge to [facility] for rehab CMS Medicare.gov Compare Post Acute Care list provided to:: Legal Guardian Choice offered to / list presented to : Patient, Adult Children  Discharge Placement        Patient chooses bed at: [facility] Patient to be transferred to facility by: PTAR Patient and family notified of of transfer: 11/17/21  Discharge Plan and Services In-house Referral: Clinical Social Work         DME Arranged: N/A DME Agency: NA  Readmission Risk Interventions    11/13/2021   12:17 PM  Readmission Risk Prevention Plan  Transportation Screening Complete  HRI or Home Care Consult Complete  Social Work Consult for [place] Planning/Counseling Complete  Palliative Care Screening Not Applicable

## 2021-11-17 NOTE — Progress Notes (Signed)
Occupational Therapy Treatment Patient Details Name: Richard Spears MRN: [id number] DOB: 01/11/1926 Today's Date: 11/17/2021   History of present illness 88 y.o. male with medical history significant for mechanical mitral valve and atrial fibrillation on warfarin, CKD stage IIIb, hypertension, ORIF of distal left femur in March 2022, and cancer of the transverse colon, now presenting to the emergency department with left hip pain after fall. Found to have Periprosthetic fracture around internal prosthetic-managing conservatively.   OT comments  Pt is making steady progress with OT at this time.  He currently presents at a min to mod assist level for transfers and functional mobility to the bathroom.  Still with significant pain in the LLE with weightbearing at 50%.  He needs frequent rest breaks with standing secondary to fatigue with mod to max assist still required for completion of LB selfcare.  Feel he will continue to benefit from acute care OT to address these deficits with transition to SNF for further rehab.    Recommendations for follow up therapy are one component of a multi-disciplinary discharge planning process, led by the attending physician.  Recommendations may be updated based on patient status, additional functional criteria and insurance authorization.    Follow Up Recommendations  Skilled nursing-short term rehab (<3 hours/day)    Assistance Recommended at Discharge Frequent or constant Supervision/Assistance  Patient can return home with the following  A lot of help with bathing/dressing/bathroom;Assistance with cooking/housework;Help with stairs or ramp for entrance;A little help with walking and/or transfers   Equipment Recommendations  Other (comment) (defer to next venue of care)       Precautions / Restrictions Precautions Precautions: Fall Precaution Comments: No active L hip ABDuction Restrictions Weight Bearing Restrictions: Yes LLE Weight Bearing:  Partial weight bearing LLE Partial Weight Bearing Percentage or Pounds: 50% Other Position/Activity Restrictions: No Abduction       Mobility Bed Mobility                    Transfers Overall transfer level: Needs assistance Equipment used: Rolling walker (2 wheels) Transfers: Sit to/from Stand, Bed to chair/wheelchair/BSC Sit to Stand: Min assist     Step pivot transfers: Min assist           Balance Overall balance assessment: Needs assistance, History of Falls Sitting-balance support: Feet supported Sitting balance-Leahy Scale: Fair     Standing balance support: Reliant on assistive device for balance, Bilateral upper extremity supported, During functional activity Standing balance-Leahy Scale: Poor                             ADL either performed or assessed with clinical judgement   ADL Overall ADL's : Needs assistance/impaired     Grooming: Set up;Sitting;Oral care                   Toilet Transfer: Minimal assistance;Rolling walker (2 wheels);+2 for safety/equipment           Functional mobility during ADLs: Minimal assistance;+2 for safety/equipment General ADL Comments: Pt able to state 50% PWBing through the LLE.  He exhibits decreased ability to take adequate step length with the RLE while supporting himself with the LLE.  He demonstrated increased fatigue, requiring seated rest break after reaching the bathroom.  Oral hygiene completed in sitting while resting with transfer out to the bedside recliner brought over to the door secondary to increased pain and limited endurance.  Cognition Arousal/Alertness: Awake/alert Behavior During Therapy: WFL for tasks assessed/performed Overall Cognitive Status: Within Functional Limits for tasks assessed                                                     Pertinent Vitals/ Pain       Pain Assessment Pain Location: LLE Pain Descriptors /  Indicators: Grimacing Pain Intervention(s): Monitored during session, Limited activity within patient's tolerance         Frequency  Min 2X/week        Progress Toward Goals  OT Goals(current goals can now be found in the care plan section)  Progress towards OT goals: Progressing toward goals  Acute Rehab OT Goals Patient Stated Goal: Pt is looking forward to going to SNF for further rehab. OT Goal Formulation: With patient Time For Goal Achievement: 11/29/21 Potential to Achieve Goals: Good  Plan Discharge plan remains appropriate       AM-PAC OT "6 Clicks" Daily Activity     Outcome Measure   Help from another person eating meals?: None Help from another person taking care of personal grooming?: A Little Help from another person toileting, which includes using toliet, bedpan, or urinal?: A Little Help from another person bathing (including washing, rinsing, drying)?: A Lot Help from another person to put on and taking off regular upper body clothing?: A Little Help from another person to put on and taking off regular lower body clothing?: A Lot 6 Click Score: 17    End of Session Equipment Utilized During Treatment: Gait belt;Rolling walker (2 wheels)  OT Visit Diagnosis: Other abnormalities of gait and mobility (R26.89);Pain;Muscle weakness (generalized) (M62.81) Pain - Right/Left: Left Pain - part of body: Leg   Activity Tolerance Patient limited by pain;Patient limited by fatigue   Patient Left in chair;with call bell/phone within reach   Nurse Communication Mobility status        Time: 1130-1200 OT Time Calculation (min): 30 min  Charges: OT General Charges $OT Visit: 1 Visit OT Treatments $Self Care/Home Management : 23-37 mins  Alazia Crocket OTR/L 11/17/2021, 1:53 PM

## 2021-11-17 NOTE — Plan of Care (Signed)
Pt ready to DC to [facility] for rehab via [place].

## 2021-11-17 NOTE — Assessment & Plan Note (Signed)
Followed by Dr. Burr Medico outpatient Receiving pembrolizumab, on hold this week Next appt 6/16

## 2021-11-17 NOTE — Discharge Summary (Signed)
Physician Discharge Summary  Richard Spears HFW:[id number] DOB: [birth year] DOA: 11/12/2021  PCP: Richard Orn, MD  Admit date: 11/12/2021 Discharge date: 11/17/2021  Time spent: 40 minutes  Recommendations for Outpatient Follow-up:  Follow outpatient CBC/CMP in 3 days Follow INR outpatient  Follow with Dr. Lyla Spears in about 2 weeks for periprosthetic femur fracture Follow with Dr. 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, initial encounter Active Problems:   Hematoma   Traumatic hematoma of buttock   Anemia   H/O mitral valve replacement with mechanical valve   Longstanding persistent atrial fibrillation: CHA2DS2-VASc Score 3   Left elbow pain   Acute kidney injury superimposed on chronic kidney disease ([place])   Essential hypertension   Prolonged QT interval   H/O colon cancer, stage III   Protein-calorie malnutrition, severe (HCC)   Decubitus ulcer   Hematoma of left hip   Discharge Condition: stable  Diet recommendation: heart healthy  Filed Weights   11/12/21 1910  Weight: 63 kg    History of present illness:   Richard Spears is Richard Spears pleasant 88 y.o. male with medical history significant for mechanical mitral valve and atrial fibrillation on warfarin, CKD stage IIIb, hypertension, ORIF of distal left femur in March 2022, and cancer of the transverse colon, now presenting to the emergency department with left hip pain after fall.  CT of the left hip is suggestive of periprosthetic left femur fracture and left posterior buttock/upper thigh hematoma.  Orhtopedics is recommending 50% weightbearing to left lower extremity with walker and 1-2 person assist at all times, no active abduction for 6 weeks.  He was continued on his warfarin.  He was transfused 1 unit of pRBC.  He's continued to improve and was discharged with plans for outpatient follow up  on 5/23.    See below for additional details    Hospital Course:  Assessment and Plan: * 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, initial encounter Per Dr. Kathaleen Spears via ED note, plan for nonsurgical management Ortho recommending no surgical intervention, LLE 50% partial weightbearing with walker and 1-2 person assit at all times, no active abduction for at least 6 weeks CT L femur with 300 cc mixed density lesion along the L posterior buttock/upper thigh region compatible with hematoma (can't excluded morel lavallee lesion), vertically oriented fracture extending through greater tuberosity and into the anterior cortex of the proximal femoral metaphysis with about 1.5 cm of step off anteriorly, most c/w periprosthetic fracture (suspected small nondisplaed vertical component extending distally in the femoral shafter anteromedially).  Remote mid to distal femoral fx with buttress plate and screw fixation.  Likely chronic anterior cortical defect in the distal femoral metaphysis. F/u in 2 weeks with Dr. Lyla Spears    Hematoma Suspected hematoma in L posterior buttock/upper thigh Appears relatively stable Ice to L thigh hematoma Continue anticoagulation  Anemia Transfused 1 unit pRBC 5/20 Hb today 8.2 today, stable from yesterday Start iron Continue to trend and transfuse for <8 or symptomatic (per oncology)  H/O mitral valve replacement with mechanical valve S/p Richard Spears Bileaflet mechanical valve replacement Continue anticoagulation (INR goal 2.5-3.5) with warfarin Follow INR outpatient  Left elbow pain Follow plain films -> negative Has foam dressing in place over skin tear Follow with ortho outpatient  Longstanding persistent atrial fibrillation: CHA2DS2-VASc Score 3 Warfarin Bisoprolol   Acute kidney injury superimposed on chronic kidney disease (HCC) Baseline  appears to be 1.7 Bumped to 2 at presentation Improved now to below baseline Will  trend  H/O colon cancer, stage III Followed by Dr. Burr Spears outpatient Receiving pembrolizumab, on hold this week Next appt 6/16  Prolonged QT interval Repeat EKG pending and follow  Essential hypertension Bisoprolol/hydralazine  Protein-calorie malnutrition, severe ([place]) Noted RD c/s  Decubitus ulcer Pressure Injury 11/12/21 Coccyx Mid Stage 1 -  Intact skin with non-blanchable redness of Richard Spears localized area usually over Richard Spears bony prominence. stage one with pink color (Active)  11/12/21 2100  Location: Coccyx  Location Orientation: Mid  Staging: Stage 1 -  Intact skin with non-blanchable redness of Richard Spears localized area usually over Richard Spears bony prominence.  Wound Description (Comments): stage one with pink color  Present on Admission: Yes          Procedures: none   Consultations: orthopedics  Discharge Exam: Vitals:   11/17/21 0555 11/17/21 0939  BP: 133/68 (!) 118/55  Pulse: 81 89  Resp: 17 18  Temp: 98.5 F (36.9 C) 97.7 F (36.5 C)  SpO2: 98% 100%   No new complaints Eager to discharge  General: No acute distress. Cardiovascular: RRR Lungs: unlabored Abdomen: Soft, nontender, nondistended  Neurological: Alert and oriented 3. Moves all extremities 4. Cranial nerves II through XII grossly intact. Skin: Warm and dry. No rashes or lesions. Extremities: L thigh/buttocks with relatively stable bruising, more purple today  Discharge Instructions   Discharge Instructions     Call MD for:  difficulty breathing, headache or visual disturbances   Complete by: As directed    Call MD for:  extreme fatigue   Complete by: As directed    Call MD for:  hives   Complete by: As directed    Call MD for:  persistant dizziness or light-headedness   Complete by: As directed    Call MD for:  persistant nausea and vomiting   Complete by: As directed    Call MD for:  redness, tenderness, or signs of infection (pain, swelling, redness, odor or green/yellow discharge around  incision site)   Complete by: As directed    Call MD for:  severe uncontrolled pain   Complete by: As directed    Call MD for:  temperature >100.4   Complete by: As directed    Diet - low sodium heart healthy   Complete by: As directed    Discharge instructions   Complete by: As directed    You were seen with Tyrin Herbers fall and Nautia Lem periprosthetic femur fracture and Lochlan Grygiel hematoma in your buttocks/thigh.    Your blood counts and hematoma appear stable at this time.  You can weight bear 50% to the left leg with 1-2 person assist at all times with Rebekka Lobello walker.  No active abduction for 6 weeks.  You'll need to follow up with orthopedics as an outpatient within 2 weeks.    You'll need repeat blood counts in about 3 days or so to ensure your counts are stable.  Recheck your INR at that time as well to ensure your warfarin dosing is stable.    You can follow up with orthopedics regarding your elbow pain as well.  Dr. Burr Spears is planning to follow up with you on 6/16 for your next cycle of pembrolizumab.    Return for new, recurrent, or worsening symptoms.  Please ask your PCP to request records from this hospitalization so they know what was done and what the next steps will be.   Discharge wound  care:   Complete by: As directed    Frequent turns, offloading   Increase activity slowly   Complete by: As directed       Allergies as of 11/17/2021       Reactions   Flexeril [cyclobenzaprine] Diarrhea        Medication List     TAKE these medications    acetaminophen 500 MG tablet Commonly known as: TYLENOL Take 1,000 mg by mouth 3 (three) times daily as needed for moderate pain.   bisoprolol 5 MG tablet Commonly known as: ZEBETA Take 1 tablet (5 mg total) by mouth daily.   ferrous sulfate 325 (65 FE) MG tablet Take 1 tablet (325 mg total) by mouth daily with breakfast. What changed: when to take this   fluticasone 50 MCG/ACT nasal spray Commonly known as: FLONASE Place 1 spray into both  nostrils daily as needed for allergies or rhinitis.   furosemide 40 MG tablet Commonly known as: LASIX Take 20-40 mg by mouth See admin instructions. Take 20 mg on Monday Wednesday and Friday and 40 mg on all other days.   hydrALAZINE 25 MG tablet Commonly known as: APRESOLINE Take 25 mg by mouth daily at lunch, may take an  extra tablet as needed for systolic BP greater than 027 mmHg daily What changed:  how much to take how to take this when to take this   isosorbide mononitrate 30 MG 24 hr tablet Commonly known as: IMDUR Take 30 mg by mouth daily.   lactose free nutrition Liqd Take 237 mLs by mouth daily.   loperamide 2 MG capsule Commonly known as: IMODIUM Take 1 capsule (2 mg total) by mouth as needed for diarrhea or loose stools. What changed: when to take this   LORazepam 1 MG tablet Commonly known as: ATIVAN Take 0.5 tablets (0.5 mg total) by mouth daily as needed for anxiety. What changed:  how much to take when to take this   MULTIVITAMIN PO Take 1 tablet by mouth daily.   oxyCODONE 5 MG immediate release tablet Commonly known as: Oxy IR/ROXICODONE Take 1/2 to 1 tablet  by mouth every 8 hours as needed for severe pain.   polyethylene glycol 17 g packet Commonly known as: MIRALAX / GLYCOLAX Take 17 g by mouth daily as needed for mild constipation.   PreserVision AREDS 2 Caps Take 1 capsule by mouth daily.   traMADol 50 MG tablet Commonly known as: ULTRAM TAKE 1 TABLET EVERY 8 HOURS AS NEEDED   warfarin 5 MG tablet Commonly known as: COUMADIN Take as directed. If you are unsure how to take this medication, talk to your nurse or doctor. Original instructions: Take 1 tablet (5 mg total) by mouth daily. What changed:  how much to take additional instructions               Discharge Care Instructions  (From admission, onward)           Start     Ordered   11/17/21 0000  Discharge wound care:       Comments: Frequent turns, offloading    11/17/21 1005           Allergies  Allergen Reactions   Flexeril [Cyclobenzaprine] Diarrhea    Follow-up Information     Rod Can, MD Follow up in 2 week(s).   Specialty: Orthopedic Surgery Why: Call for Jaxson Keener follow up appt in 2 weeks Contact information: [address] [place] [address] [phone]  Richard Orn, MD Follow up.   Specialty: Internal Medicine Contact information: [address] [place] [address] [phone]         Leonie Man, MD .   Specialty: Cardiology Contact information: [address] [place] [address] [phone]                  The results of significant diagnostics from this hospitalization (including imaging, microbiology, ancillary and laboratory) are listed below for reference.    Significant Diagnostic Studies: DG Chest 1 View  Result Date: 11/12/2021 CLINICAL DATA:  Fall. EXAM: CHEST  1 VIEW COMPARISON:  PET-CT 10/16/2021 FINDINGS: Cardiomegaly. Atherosclerotic calcification of the aortic arch. Prosthetic mitral valve. Prior median sternotomy. Hazy opacities along the mid lungs are probably from skin folds. Old healed right rib fractures. IMPRESSION: 1. Stable cardiomegaly. 2. Hazy densities along the mid lungs bilaterally, probably skin folds. 3.  Aortic Atherosclerosis (ICD10-I70.0). Electronically Signed   By: Van Clines M.D.   On: 11/12/2021 18:42   DG Elbow 2 Views Left  Result Date: 11/15/2021 CLINICAL DATA:  Fall 2 days ago with left elbow pain. EXAM: LEFT ELBOW - 2 VIEW COMPARISON:  None Available. FINDINGS: There is no evidence of fracture, dislocation, or joint effusion. Mild degenerative changes are seen in the elbow joint. Soft tissues are unremarkable. IMPRESSION: No acute osseous injury.  No joint effusion. Electronically Signed   By: Zerita Boers M.D.   On: 11/15/2021 14:07   CT Head Wo Contrast  Result Date:  11/12/2021 CLINICAL DATA:  Fall. EXAM: CT HEAD WITHOUT CONTRAST CT CERVICAL SPINE WITHOUT CONTRAST TECHNIQUE: Multidetector CT imaging of the head and cervical spine was performed following the standard protocol without intravenous contrast. Multiplanar CT image reconstructions of the cervical spine were also generated. RADIATION DOSE REDUCTION: This exam was performed according to the departmental dose-optimization program which includes automated exposure control, adjustment of the mA and/or kV according to patient size and/or use of iterative reconstruction technique. COMPARISON:  None Available. FINDINGS: CT HEAD FINDINGS Brain: No evidence of acute infarction, hemorrhage, hydrocephalus, extra-axial collection or mass lesion/mass effect. There is moderate diffuse atrophy. There is mild periventricular white matter hypodensity, likely chronic small vessel ischemic change. Vascular: Atherosclerotic calcifications are present within the cavernous internal carotid arteries. Skull: Normal. Negative for fracture or focal lesion. Sinuses/Orbits: No acute finding. Other: None. CT CERVICAL SPINE FINDINGS Alignment: There is trace anterolisthesis at C6-C7 which is likely degenerative. Alignment is otherwise anatomic. Skull base and vertebrae: Bones are osteopenic. No acute fracture. No primary bone lesion or focal pathologic process. Soft tissues and spinal canal: No prevertebral fluid or swelling. No visible canal hematoma. Disc levels: There is severe disc space narrowing at C3-C4, C4-C5 and C5-C6. No significant central canal or neural foraminal stenosis at any level. Upper chest: Negative. Other: None. IMPRESSION: 1.  No acute intracranial process. 2. No acute fracture or traumatic subluxation of the cervical spine. 3. Moderate diffuse brain atrophy. Mild chronic small vessel ischemic change. 4.  Degenerative changes of the cervical spine as above. Electronically Signed   By: Ronney Asters M.D.   On: 11/12/2021 17:52    CT Cervical Spine Wo Contrast  Result Date: 11/12/2021 CLINICAL DATA:  Fall. EXAM: CT HEAD WITHOUT CONTRAST CT CERVICAL SPINE WITHOUT CONTRAST TECHNIQUE: Multidetector CT imaging of the head and cervical spine was performed following the standard protocol without intravenous contrast. Multiplanar CT image reconstructions of the cervical spine were also generated. RADIATION  DOSE REDUCTION: This exam was performed according to the departmental dose-optimization program which includes automated exposure control, adjustment of the mA and/or kV according to patient size and/or use of iterative reconstruction technique. COMPARISON:  None Available. FINDINGS: CT HEAD FINDINGS Brain: No evidence of acute infarction, hemorrhage, hydrocephalus, extra-axial collection or mass lesion/mass effect. There is moderate diffuse atrophy. There is mild periventricular white matter hypodensity, likely chronic small vessel ischemic change. Vascular: Atherosclerotic calcifications are present within the cavernous internal carotid arteries. Skull: Normal. Negative for fracture or focal lesion. Sinuses/Orbits: No acute finding. Other: None. CT CERVICAL SPINE FINDINGS Alignment: There is trace anterolisthesis at C6-C7 which is likely degenerative. Alignment is otherwise anatomic. Skull base and vertebrae: Bones are osteopenic. No acute fracture. No primary bone lesion or focal pathologic process. Soft tissues and spinal canal: No prevertebral fluid or swelling. No visible canal hematoma. Disc levels: There is severe disc space narrowing at C3-C4, C4-C5 and C5-C6. No significant central canal or neural foraminal stenosis at any level. Upper chest: Negative. Other: None. IMPRESSION: 1.  No acute intracranial process. 2. No acute fracture or traumatic subluxation of the cervical spine. 3. Moderate diffuse brain atrophy. Mild chronic small vessel ischemic change. 4.  Degenerative changes of the cervical spine as above. Electronically  Signed   By: Ronney Asters M.D.   On: 11/12/2021 17:52   CT FEMUR LEFT WO CONTRAST  Result Date: 11/12/2021 CLINICAL DATA:  Trauma, fall, possible fracture. Unable to bear weight. EXAM: CT OF THE LOWER LEFT EXTREMITY WITHOUT CONTRAST TECHNIQUE: Multidetector CT imaging of the lower left extremity was performed according to the standard protocol. RADIATION DOSE REDUCTION: This exam was performed according to the departmental dose-optimization program which includes automated exposure control, adjustment of the mA and/or kV according to patient size and/or use of iterative reconstruction technique. COMPARISON:  Radiographs 08/28/2020 FINDINGS: Bones/Joint/Cartilage Streak artifact associated with the left total hip prosthesis left lateral buttress plate and screw fixator noted. Old healed left inferior pubic ramus fracture, image 72 series 2. Left femur proximal periprosthetic fracture involving the greater trochanter and anterolateral left proximal femoral metaphysis, with about 1.2 cm of cortical step-off anteriorly on image 74 series 4. Suspected vertical nondisplaced component of this fracture extending distally in the femoral shaft anteromedial to the stem of the prosthesis for example on image 126 of series 2. This becomes indistinct distally. There is Avagrace Botelho previous oblique fracture of the femoral diaphysis with healing response noted along the upper fracture margins for example on image 184 of series 2, traversed by the buttress plate and with cerclage is in the vicinity of the fracture. On image 219 of series 2, there is Duwayne Matters 1.8 by 3.5 cm cortical defect in the anterior femoral shaft which is probably chronic. Osteoarthritis of the knee is present. Ligaments Suboptimally assessed by CT. Muscles and Tendons Unremarkable Soft tissues Indirect left inguinal hernia contains loop of bowel without strangulation or obstruction. Substantial atherosclerosis. 7.9 by 5.1 by 14.4 cm (volume = 300 cm^3) mixed density  subcutaneous structure along the left buttock and upper thigh region favoring hematoma. This partially abuts the superficial fascia margin. There is substantial surrounding subcutaneous edema along the buttock and tracking in the left lateral thigh. IMPRESSION: 1. 300 cc mixed density subcutaneous lesion along the left posterior buttock/upper thigh region compatible with hematoma. Cannot completely exclude Sherry Ruffing lesion as this abuts the superficial fascia margin. Surrounding subcutaneous edema noted. 2. Primarily vertically oriented fracture extending through the greater tuberosity and into the  anterior cortex of the proximal femoral metaphysis, with about 1.5 cm of step-off anteriorly, most compatible with Maytte Jacot periprosthetic fracture. Suspected small nondisplaced vertical component extending distally in the femoral shaft anteromedially. 3. Remote mid to distal femoral fracture with buttress plate and screw fixation. Likely chronic anterior cortical defect in the distal femoral metaphysis. 4. Left inguinal hernia contains bowel. 5. Old left inferior pubic ramus fracture. Electronically Signed   By: Van Clines M.D.   On: 11/12/2021 18:06   DG Knee Complete 4 Views Left  Result Date: 11/12/2021 CLINICAL DATA:  Fall EXAM: LEFT KNEE - COMPLETE 4+ VIEW COMPARISON:  CT scan from 11/12/2021 FINDINGS: Bony demineralization. Distal femoral buttress plate laterally with cerclage fixation along the site of the prior oblique shaft fracture. Articular space narrowing in the knee especially in the medial compartment. Trace knee effusion. Substantial spurring in the patellofemoral joint. Distal SFA and popliteal artery atherosclerotic calcification. IMPRESSION: 1. Substantial osteoarthritis of the knee.  Small knee effusion. 2. Postoperative findings in the distal femur. 3. Atherosclerosis. Electronically Signed   By: Van Clines M.D.   On: 11/12/2021 18:40   DG Hip Unilat With Pelvis 2-3 Views  Left  Result Date: 11/12/2021 CLINICAL DATA:  Fall. EXAM: DG HIP (WITH OR WITHOUT PELVIS) 2-3V LEFT COMPARISON:  Left hip x-ray 08/28/2020 FINDINGS: Bones are diffusely osteopenic. Left hip arthroplasty is again seen in anatomic alignment. There is Viona Hosking lateral mid femoral sideplate, partially imaged. There is no evidence for hardware loosening. There is an acute oblique fracture through the lateral aspect of the proximal left femur just inferior to the intratrochanteric region. Fracture fragment is distracted 1 cm. There is Tayton Decaire healed left inferior pubic ramus fracture. Right hip arthroplasty appears uncomplicated. IMPRESSION: 1. Acute fracture through the proximal left femur just inferior to the intratrochanteric region. 2. Left hip arthroplasty and femoral sideplate appear in anatomic alignment. Electronically Signed   By: Ronney Asters M.D.   On: 11/12/2021 18:41    Microbiology: Recent Results (from the past 240 hour(s))  Surgical pcr screen     Status: None   Collection Time: 11/13/21  1:29 AM   Specimen: Nasal Mucosa; Nasal Swab  Result Value Ref Range Status   MRSA, PCR NEGATIVE NEGATIVE Final   Staphylococcus aureus NEGATIVE NEGATIVE Final    Comment: (NOTE) The Xpert SA Assay (FDA approved for NASAL specimens in patients 64 years of age and older), is one component of Shammond Arave comprehensive surveillance program. It is not intended to diagnose infection nor to guide or monitor treatment. Performed at [facility], [place] [address], [place], [place] [address]      Labs: Basic Metabolic Panel: Recent Labs  Lab 11/13/21 0347 11/14/21 0331 11/15/21 0306 11/16/21 0333 11/17/21 0311  NA 134* 139 138 136 135  K 3.8 4.1 4.5 4.6 4.9  CL 103 108 108 105 106  CO2 '26 26 27 25 25  '$ GLUCOSE 97 96 112* 126* 97  BUN 43* 34* 32* 29* 34*  CREATININE 1.60* 1.35* 1.30* 1.22 1.20  CALCIUM 8.2* 8.4* 8.5* 8.6* 8.6*  MG 2.3  --   --  2.3 2.2  PHOS  --   --   --  2.7 2.7   Liver  Function Tests: Recent Labs  Lab 11/16/21 0333 11/17/21 0311  AST 23 26  ALT 13 16  ALKPHOS 43 44  BILITOT 0.9 0.8  PROT 5.7* 5.9*  ALBUMIN 2.7* 2.7*   No results for input(s): LIPASE, AMYLASE in the  last 168 hours. No results for input(s): AMMONIA in the last 168 hours. CBC: Recent Labs  Lab 11/12/21 1707 11/13/21 0347 11/13/21 1104 11/14/21 0331 11/14/21 1210 11/15/21 0306 11/16/21 0333 11/17/21 0311  WBC 4.7 3.0*  --  3.0*  --  3.1* 3.0* 3.5*  NEUTROABS 3.4  --   --   --   --   --  1.9 2.2  HGB 10.0* 7.9*   < > 7.2* 7.4* 7.9* 8.1* 8.2*  HCT 31.4* 24.6*   < > 22.2* 22.7* 24.9* 25.1* 26.3*  MCV 100.3* 100.0  --  100.0  --  98.4 98.0 97.8  PLT 161 147*  --  134*  --  153 145* 154   < > = values in this interval not displayed.   Cardiac Enzymes: No results for input(s): CKTOTAL, CKMB, CKMBINDEX, TROPONINI in the last 168 hours. BNP: BNP (last 3 results) Recent Labs    07/07/21 0113 07/08/21 0110 07/09/21 0123  BNP 530.9* 437.7* 451.0*    ProBNP (last 3 results) No results for input(s): PROBNP in the last 8760 hours.  CBG: No results for input(s): GLUCAP in the last 168 hours.     Signed:  Fayrene Helper MD.  [facility] 11/17/2021, 10:13 AM

## 2021-11-18 ENCOUNTER — Non-Acute Institutional Stay (SKILLED_NURSING_FACILITY): Payer: Medicare Other | Admitting: Orthopedic Surgery

## 2021-11-18 ENCOUNTER — Encounter: Payer: Self-pay | Admitting: Orthopedic Surgery

## 2021-11-18 DIAGNOSIS — K5903 Drug induced constipation: Secondary | ICD-10-CM | POA: Diagnosis not present

## 2021-11-18 DIAGNOSIS — S7002XD Contusion of left hip, subsequent encounter: Secondary | ICD-10-CM | POA: Diagnosis not present

## 2021-11-18 DIAGNOSIS — I4811 Longstanding persistent atrial fibrillation: Secondary | ICD-10-CM | POA: Diagnosis not present

## 2021-11-18 DIAGNOSIS — N183 Chronic kidney disease, stage 3 unspecified: Secondary | ICD-10-CM

## 2021-11-18 DIAGNOSIS — M6281 Muscle weakness (generalized): Secondary | ICD-10-CM | POA: Diagnosis not present

## 2021-11-18 DIAGNOSIS — M979XXD Periprosthetic fracture around unspecified internal prosthetic joint, subsequent encounter: Secondary | ICD-10-CM

## 2021-11-18 DIAGNOSIS — Z85038 Personal history of other malignant neoplasm of large intestine: Secondary | ICD-10-CM | POA: Diagnosis not present

## 2021-11-18 DIAGNOSIS — R2681 Unsteadiness on feet: Secondary | ICD-10-CM | POA: Diagnosis not present

## 2021-11-18 DIAGNOSIS — M25522 Pain in left elbow: Secondary | ICD-10-CM

## 2021-11-18 DIAGNOSIS — E871 Hypo-osmolality and hyponatremia: Secondary | ICD-10-CM

## 2021-11-18 DIAGNOSIS — S300XXD Contusion of lower back and pelvis, subsequent encounter: Secondary | ICD-10-CM | POA: Diagnosis not present

## 2021-11-18 DIAGNOSIS — Z952 Presence of prosthetic heart valve: Secondary | ICD-10-CM

## 2021-11-18 DIAGNOSIS — E43 Unspecified severe protein-calorie malnutrition: Secondary | ICD-10-CM

## 2021-11-18 DIAGNOSIS — R41841 Cognitive communication deficit: Secondary | ICD-10-CM | POA: Diagnosis not present

## 2021-11-18 DIAGNOSIS — D62 Acute posthemorrhagic anemia: Secondary | ICD-10-CM

## 2021-11-18 DIAGNOSIS — Z9181 History of falling: Secondary | ICD-10-CM | POA: Diagnosis not present

## 2021-11-18 DIAGNOSIS — I10 Essential (primary) hypertension: Secondary | ICD-10-CM

## 2021-11-18 DIAGNOSIS — M9702XA Periprosthetic fracture around internal prosthetic left hip joint, initial encounter: Secondary | ICD-10-CM | POA: Diagnosis not present

## 2021-11-18 DIAGNOSIS — T402X5A Adverse effect of other opioids, initial encounter: Secondary | ICD-10-CM

## 2021-11-18 DIAGNOSIS — M9702XD Periprosthetic fracture around internal prosthetic left hip joint, subsequent encounter: Secondary | ICD-10-CM | POA: Diagnosis not present

## 2021-11-18 MED ORDER — SENNA 8.6 MG PO TABS: 2.0000 | ORAL_TABLET | Freq: Every day | ORAL | 0 refills | Status: DC

## 2021-11-18 NOTE — Progress Notes (Signed)
Location:  [place] Room Number: 1 Place of Service:  SNF [phone]) Provider:  Yvonna Alanis, NP   [name], MD  Patient Care Team: [name], MD as PCP - General (Internal Medicine) Leonie Man, MD as PCP - Cardiology (Cardiology) [name], MD as Consulting Physician (Hematology)  Extended Emergency Contact Information Primary Emergency Contact: [facility] Address: [address]          [place], [place] [address] Johnnette Litter of [place] Phone: [phone] Relation: Spouse Secondary Emergency Contact: Liew,Newell  [place] Phone: [phone] Mobile Phone: [phone] Relation: Son  Code Status:  Full code Goals of care: Advanced Directive information    11/12/2021    6:52 PM  Advanced Directives  Does Patient Have a Medical Advance Directive? Yes  Type of Advance Directive Out of facility DNR (pink MOST or yellow form);Living will  Does patient want to make changes to medical advance directive? No - Patient declined     Chief Complaint  Patient presents with   Hospitalization Follow-up    HPI:  Pt is a 88 y.o. male seen today s/p hospitalization 05/18-05/23.   05/18 he fell while using bathroom. S/p ORIF distal left femur March, 2022. He immediately felt left hip and elbow pain after incident, but refused to go to ED. He was transported to [facility] ED later in the day due to unresolved pain. CT left hip revealed periprosthetic left femur fracture and left posterior/upper thigh hematoma. Xray left elbow unremarkable. Orthopedics consulted, recommended non surgical management with outpatient f/u. Hgb was noted to be 7.2 after incident, thought to be related to large hematoma. He was transfused 1 unit PRBC, hgb 8.2 at discharge. Remains on coumadin and bisoprolol for atrial fibrillation. He was also seen by his oncologist, Dr. Burr Medico due stage III colon cancer. Pembrolizumab was placed on  hold, advised to keep treatment appointment 12/11/2021. He was discharged to the skilled nursing unit at [facility], awaiting bed at [facility].   Today, he reports increased pain when turning in bed and with movement. He has been prescribed oxycodone and tramadol for breakthrough pain. At the time, he is PWB %, 1-2 person assist at all times, no abduction movements. LBM 05/24. He ate all his breakfast during out encounter. He is hopeful to discharge back to apartment with wife after rehab.      Past Medical History:  Diagnosis Date   Anemia    leakoppenia   BPH (benign prostatic hypertrophy)    Bullous pemphigoid    [name], March 2011, right forearm squamous cell carcinoma   Chronic anticoagulation    systemic   Colon polyp    transverse, 2002   History of peptic ulcer    remote, 3/95   Hx of actinic keratosis    Hx of basal cell carcinoma    Hx of squamous cell carcinoma of skin    Hyperlipidemia    Left inguinal hernia    Moderate aortic insufficiency 2009   audible aortic insufficiency on 1/09 echo   PAF (paroxysmal atrial fibrillation) ([place]) 01/17/2014   On Warfarin.   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 Jude mechanical valve   ORIF FEMUR FRACTURE Left 08/28/2020   Procedure: OPEN REDUCTION  INTERNAL FIXATION (ORIF) DISTAL FEMUR FRACTURE;  Surgeon: Rod Can, MD;  Location: [facility];  Service: Orthopedics;  Laterality: Left;   PARTIAL COLECTOMY N/A 06/30/2021   Procedure: PARTIAL COLECTOMY;  Surgeon: Clovis Riley, MD;  Location: [place];  Service: General;  Laterality: N/A;   TOTAL HIP ARTHROPLASTY Right 10/12/2017   Procedure: RIGHT TOTAL HIP ARTHROPLASTY ANTERIOR APPROACH;  Surgeon: Gaynelle Arabian, MD;  Location: [facility];  Service: Orthopedics;  Laterality: Right;   TRANSTHORACIC ECHOCARDIOGRAM  12/2018   Unable to assess diastolic function because of A. fib. Normal RV function, but moderately elevated RVSP.  Severe biatrial enlargement. S/P St Jude bileaflet mechanical MVR that appears to be functioning normally. Mitral valve regurgitation cannot assess due to mechanical valve shadowing. MV Mean grad: 7.0 mmHg MV Area (PHT): 3.38 cm (stable for valve).  Mild Ao Sclerosis, Mild-Mod AI   TRANSTHORACIC ECHOCARDIOGRAM  08/'17; 10/'18    a) Mild conc LVH. EF 55-60%. No RWMA. Mod AI. Mechanical MV prosthesis functioning properly. LAD dilation.;; b)  EF 55-60%.  Mo AI.  Bileaflet Saint Jude mechanical MV with no paravalvular leak.  Severe LA dilation.  Minimally elevated PAP    Allergies  Allergen Reactions   Flexeril [Cyclobenzaprine] Diarrhea    Outpatient Encounter Medications as of 11/18/2021  Medication Sig   acetaminophen (TYLENOL) 500 MG tablet Take 1,000 mg by mouth 3 (three) times daily as needed for moderate pain.   bisoprolol (ZEBETA) 5 MG tablet Take 1 tablet (5 mg total) by mouth daily.   ferrous sulfate 325 (65 FE) MG tablet Take 1 tablet (325 mg total) by mouth daily with breakfast.   fluticasone (FLONASE) 50 MCG/ACT nasal spray Place 1 spray into both nostrils daily as needed for allergies or rhinitis.   furosemide (LASIX) 40 MG tablet Take 20-40 mg by mouth See admin instructions. Take 20 mg on Monday Wednesday and Friday and 40 mg on all other days.   hydrALAZINE  (APRESOLINE) 25 MG tablet Take 25 mg by mouth daily at lunch, may take an  extra tablet as needed for systolic BP greater than 242 mmHg daily (Patient taking differently: Take 25 mg by mouth daily. Take 25 mg by mouth daily at lunch, may take an  extra tablet as needed for systolic BP greater than 683 mmHg daily)   isosorbide mononitrate (IMDUR) 30 MG 24 hr tablet Take 30 mg by mouth daily.   lactose free nutrition (BOOST) LIQD Take 237 mLs by mouth daily.   loperamide (IMODIUM) 2 MG capsule Take 1 capsule (2 mg total) by mouth as needed for diarrhea or loose stools. (Patient taking differently: Take 2 mg by mouth daily as needed for diarrhea or loose stools.)   LORazepam (ATIVAN) 1 MG tablet Take 0.5 tablets (0.5 mg total) by mouth daily as needed for anxiety. (Patient taking differently: Take 1 mg by mouth at bedtime.)   Multiple Vitamins-Minerals (MULTIVITAMIN PO) Take 1 tablet by mouth daily.   Multiple  Vitamins-Minerals (PRESERVISION AREDS 2) CAPS Take 1 capsule by mouth daily.   oxyCODONE (OXY IR/ROXICODONE) 5 MG immediate release tablet Take 1/2 to 1 tablet  by mouth every 8 hours as needed for severe pain.   polyethylene glycol (MIRALAX / GLYCOLAX) 17 g packet Take 17 g by mouth daily as needed for mild constipation.   warfarin (COUMADIN) 5 MG tablet Take 1 tablet (5 mg total) by mouth daily. (Patient taking differently: Take 2.5-5 mg by mouth daily. Take 5 mg Monday, Wednesday and Friday and 2.5 mg on all other days.)   No facility-administered encounter medications on file as of 11/18/2021.    Review of Systems  Constitutional:  Negative for activity change, appetite change, chills, fatigue and fever.  HENT:  Negative for congestion and trouble swallowing.   Eyes:  Negative for visual disturbance.  Respiratory:  Negative for cough, shortness of breath and wheezing.   Cardiovascular:  Negative for chest pain and leg swelling.  Gastrointestinal:  Positive for constipation. Negative for  abdominal distention, abdominal pain, blood in stool, diarrhea, nausea and vomiting.  Genitourinary:  Positive for frequency. Negative for dysuria and hematuria.  Musculoskeletal:  Positive for arthralgias, back pain, gait problem and joint swelling.       Left hip and shoulder pain  Skin:  Positive for wound.       Hematoma to left hip, skin tears  Neurological:  Positive for weakness. Negative for dizziness and headaches.  Psychiatric/Behavioral:  Negative for confusion, dysphoric mood and sleep disturbance. The patient is not nervous/anxious.    Immunization History  Administered Date(s) Administered   Influenza, High Dose Seasonal PF 04/20/2016   Influenza,inj,Quad PF,6+ Mos 03/28/2018   Influenza-Unspecified 03/30/2017   Moderna SARS-COV2 Booster Vaccination 12/03/2020   Moderna Sars-Covid-2 Vaccination 07/02/2019, 07/30/2019, 05/12/2020   Pneumococcal Conjugate-13 06/10/2014   Pneumococcal-Unspecified 02/02/2006   Tdap 06/10/2014   Zoster, Live 03/29/2015   Zoster, Unspecified 05/02/2015   Pertinent  Health Maintenance Due  Topic Date Due   INFLUENZA VACCINE  01/26/2022      11/15/2021    7:16 AM 11/16/2021    1:00 AM 11/16/2021    8:20 AM 11/17/2021   12:00 AM 11/17/2021   10:15 AM  Fall Risk  Patient Fall Risk Level High fall risk High fall risk High fall risk High fall risk High fall risk   Functional Status Survey:    Vitals:   11/18/21 0903  BP: (!) 142/67  Pulse: 82  Resp: 20  Temp: (!) 96.8 F (36 C)  SpO2: 96%  Weight: 134 lb 12.8 oz (61.1 kg)  Height: '5\' 11"'$  (1.803 m)   Body mass index is 18.8 kg/m. Physical Exam Vitals reviewed.  Constitutional:      General: He is not in acute distress. HENT:     Head: Normocephalic.     Right Ear: There is no impacted cerumen.     Left Ear: There is no impacted cerumen.     Nose: Nose normal.     Mouth/Throat:     Mouth: Mucous membranes are moist.  Eyes:     General:        Right eye: No discharge.         Left eye: No discharge.     Comments: glasses  Cardiovascular:     Rate and Rhythm: Normal rate. Rhythm irregular.     Pulses: Normal pulses.     Heart sounds: Murmur heard.  Pulmonary:     Effort: Pulmonary effort  is normal. No respiratory distress.     Breath sounds: Normal breath sounds. No wheezing.  Abdominal:     General: Bowel sounds are normal. There is no distension.     Palpations: Abdomen is soft.     Tenderness: There is no abdominal tenderness.  Musculoskeletal:     Cervical back: Neck supple.     Right lower leg: No edema.     Left lower leg: No edema.  Skin:    General: Skin is warm and dry.     Capillary Refill: Capillary refill takes less than 2 seconds.     Comments: Moderate sized skin tear to left elbow, CDI, granulation tissue present, no drainage. Large hematoma to left upper thigh/posterior buttocks, no skin breakdown, warmth and tenderness noted. Small skin tear to right shin, CDI, no drainage. Lower extremities with mottled appearance, cool to touch.   Neurological:     General: No focal deficit present.     Mental Status: He is alert and oriented to person, place, and time.     Motor: Weakness present.     Gait: Gait abnormal.     Comments: Walker, wheelchair  Psychiatric:        Mood and Affect: Mood normal.        Behavior: Behavior normal.    Labs reviewed: Recent Labs    07/06/21 0156 07/06/21 1216 11/13/21 0347 11/14/21 0331 11/15/21 0306 11/16/21 0333 11/17/21 0311  NA  --    < > 134*   < > 138 136 135  K  --    < > 3.8   < > 4.5 4.6 4.9  CL  --    < > 103   < > 108 105 106  CO2  --    < > 26   < > '27 25 25  '$ GLUCOSE  --    < > 97   < > 112* 126* 97  BUN  --    < > 43*   < > 32* 29* 34*  CREATININE  --    < > 1.60*   < > 1.30* 1.22 1.20  CALCIUM  --    < > 8.2*   < > 8.5* 8.6* 8.6*  MG  --    < > 2.3  --   --  2.3 2.2  PHOS 3.0  --   --   --   --  2.7 2.7   < > = values in this interval not displayed.   Recent Labs     10/29/21 0834 11/16/21 0333 11/17/21 0311  AST '27 23 26  '$ ALT '15 13 16  '$ ALKPHOS 58 43 44  BILITOT 0.8 0.9 0.8  PROT 7.2 5.7* 5.9*  ALBUMIN 3.7 2.7* 2.7*   Recent Labs    11/12/21 1707 11/13/21 0347 11/15/21 0306 11/16/21 0333 11/17/21 0311  WBC 4.7   < > 3.1* 3.0* 3.5*  NEUTROABS 3.4  --   --  1.9 2.2  HGB 10.0*   < > 7.9* 8.1* 8.2*  HCT 31.4*   < > 24.9* 25.1* 26.3*  MCV 100.3*   < > 98.4 98.0 97.8  PLT 161   < > 153 145* 154   < > = values in this interval not displayed.   Lab Results  Component Value Date   TSH 14.552 (H) 10/29/2021   No results found for: HGBA1C No results found for: CHOL, HDL, LDLCALC, LDLDIRECT, TRIG, CHOLHDL  Significant Diagnostic Results in last 30  days:  DG Chest 1 View  Result Date: 11/12/2021 CLINICAL DATA:  Fall. EXAM: CHEST  1 VIEW COMPARISON:  PET-CT 10/16/2021 FINDINGS: Cardiomegaly. Atherosclerotic calcification of the aortic arch. Prosthetic mitral valve. Prior median sternotomy. Hazy opacities along the mid lungs are probably from skin folds. Old healed right rib fractures. IMPRESSION: 1. Stable cardiomegaly. 2. Hazy densities along the mid lungs bilaterally, probably skin folds. 3.  Aortic Atherosclerosis (ICD10-I70.0). Electronically Signed   By: Van Clines M.D.   On: 11/12/2021 18:42   DG Elbow 2 Views Left  Result Date: 11/15/2021 CLINICAL DATA:  Fall 2 days ago with left elbow pain. EXAM: LEFT ELBOW - 2 VIEW COMPARISON:  None Available. FINDINGS: There is no evidence of fracture, dislocation, or joint effusion. Mild degenerative changes are seen in the elbow joint. Soft tissues are unremarkable. IMPRESSION: No acute osseous injury.  No joint effusion. Electronically Signed   By: Zerita Boers M.D.   On: 11/15/2021 14:07   CT Head Wo Contrast  Result Date: 11/12/2021 CLINICAL DATA:  Fall. EXAM: CT HEAD WITHOUT CONTRAST CT CERVICAL SPINE WITHOUT CONTRAST TECHNIQUE: Multidetector CT imaging of the head and cervical spine was  performed following the standard protocol without intravenous contrast. Multiplanar CT image reconstructions of the cervical spine were also generated. RADIATION DOSE REDUCTION: This exam was performed according to the departmental dose-optimization program which includes automated exposure control, adjustment of the mA and/or kV according to patient size and/or use of iterative reconstruction technique. COMPARISON:  None Available. FINDINGS: CT HEAD FINDINGS Brain: No evidence of acute infarction, hemorrhage, hydrocephalus, extra-axial collection or mass lesion/mass effect. There is moderate diffuse atrophy. There is mild periventricular white matter hypodensity, likely chronic small vessel ischemic change. Vascular: Atherosclerotic calcifications are present within the cavernous internal carotid arteries. Skull: Normal. Negative for fracture or focal lesion. Sinuses/Orbits: No acute finding. Other: None. CT CERVICAL SPINE FINDINGS Alignment: There is trace anterolisthesis at C6-C7 which is likely degenerative. Alignment is otherwise anatomic. Skull base and vertebrae: Bones are osteopenic. No acute fracture. No primary bone lesion or focal pathologic process. Soft tissues and spinal canal: No prevertebral fluid or swelling. No visible canal hematoma. Disc levels: There is severe disc space narrowing at C3-C4, C4-C5 and C5-C6. No significant central canal or neural foraminal stenosis at any level. Upper chest: Negative. Other: None. IMPRESSION: 1.  No acute intracranial process. 2. No acute fracture or traumatic subluxation of the cervical spine. 3. Moderate diffuse brain atrophy. Mild chronic small vessel ischemic change. 4.  Degenerative changes of the cervical spine as above. Electronically Signed   By: Ronney Asters M.D.   On: 11/12/2021 17:52   CT Cervical Spine Wo Contrast  Result Date: 11/12/2021 CLINICAL DATA:  Fall. EXAM: CT HEAD WITHOUT CONTRAST CT CERVICAL SPINE WITHOUT CONTRAST TECHNIQUE:  Multidetector CT imaging of the head and cervical spine was performed following the standard protocol without intravenous contrast. Multiplanar CT image reconstructions of the cervical spine were also generated. RADIATION DOSE REDUCTION: This exam was performed according to the departmental dose-optimization program which includes automated exposure control, adjustment of the mA and/or kV according to patient size and/or use of iterative reconstruction technique. COMPARISON:  None Available. FINDINGS: CT HEAD FINDINGS Brain: No evidence of acute infarction, hemorrhage, hydrocephalus, extra-axial collection or mass lesion/mass effect. There is moderate diffuse atrophy. There is mild periventricular white matter hypodensity, likely chronic small vessel ischemic change. Vascular: Atherosclerotic calcifications are present within the cavernous internal carotid arteries. Skull: Normal. Negative for fracture or  focal lesion. Sinuses/Orbits: No acute finding. Other: None. CT CERVICAL SPINE FINDINGS Alignment: There is trace anterolisthesis at C6-C7 which is likely degenerative. Alignment is otherwise anatomic. Skull base and vertebrae: Bones are osteopenic. No acute fracture. No primary bone lesion or focal pathologic process. Soft tissues and spinal canal: No prevertebral fluid or swelling. No visible canal hematoma. Disc levels: There is severe disc space narrowing at C3-C4, C4-C5 and C5-C6. No significant central canal or neural foraminal stenosis at any level. Upper chest: Negative. Other: None. IMPRESSION: 1.  No acute intracranial process. 2. No acute fracture or traumatic subluxation of the cervical spine. 3. Moderate diffuse brain atrophy. Mild chronic small vessel ischemic change. 4.  Degenerative changes of the cervical spine as above. Electronically Signed   By: Ronney Asters M.D.   On: 11/12/2021 17:52   CT FEMUR LEFT WO CONTRAST  Result Date: 11/12/2021 CLINICAL DATA:  Trauma, fall, possible fracture.  Unable to bear weight. EXAM: CT OF THE LOWER LEFT EXTREMITY WITHOUT CONTRAST TECHNIQUE: Multidetector CT imaging of the lower left extremity was performed according to the standard protocol. RADIATION DOSE REDUCTION: This exam was performed according to the departmental dose-optimization program which includes automated exposure control, adjustment of the mA and/or kV according to patient size and/or use of iterative reconstruction technique. COMPARISON:  Radiographs 08/28/2020 FINDINGS: Bones/Joint/Cartilage Streak artifact associated with the left total hip prosthesis left lateral buttress plate and screw fixator noted. Old healed left inferior pubic ramus fracture, image 72 series 2. Left femur proximal periprosthetic fracture involving the greater trochanter and anterolateral left proximal femoral metaphysis, with about 1.2 cm of cortical step-off anteriorly on image 74 series 4. Suspected vertical nondisplaced component of this fracture extending distally in the femoral shaft anteromedial to the stem of the prosthesis for example on image 126 of series 2. This becomes indistinct distally. There is a previous oblique fracture of the femoral diaphysis with healing response noted along the upper fracture margins for example on image 184 of series 2, traversed by the buttress plate and with cerclage is in the vicinity of the fracture. On image 219 of series 2, there is a 1.8 by 3.5 cm cortical defect in the anterior femoral shaft which is probably chronic. Osteoarthritis of the knee is present. Ligaments Suboptimally assessed by CT. Muscles and Tendons Unremarkable Soft tissues Indirect left inguinal hernia contains loop of bowel without strangulation or obstruction. Substantial atherosclerosis. 7.9 by 5.1 by 14.4 cm (volume = 300 cm^3) mixed density subcutaneous structure along the left buttock and upper thigh region favoring hematoma. This partially abuts the superficial fascia margin. There is substantial  surrounding subcutaneous edema along the buttock and tracking in the left lateral thigh. IMPRESSION: 1. 300 cc mixed density subcutaneous lesion along the left posterior buttock/upper thigh region compatible with hematoma. Cannot completely exclude Sherry Ruffing lesion as this abuts the superficial fascia margin. Surrounding subcutaneous edema noted. 2. Primarily vertically oriented fracture extending through the greater tuberosity and into the anterior cortex of the proximal femoral metaphysis, with about 1.5 cm of step-off anteriorly, most compatible with a periprosthetic fracture. Suspected small nondisplaced vertical component extending distally in the femoral shaft anteromedially. 3. Remote mid to distal femoral fracture with buttress plate and screw fixation. Likely chronic anterior cortical defect in the distal femoral metaphysis. 4. Left inguinal hernia contains bowel. 5. Old left inferior pubic ramus fracture. Electronically Signed   By: Van Clines M.D.   On: 11/12/2021 18:06   DG Knee Complete 4 Views Left  Result Date: 11/12/2021 CLINICAL DATA:  Fall EXAM: LEFT KNEE - COMPLETE 4+ VIEW COMPARISON:  CT scan from 11/12/2021 FINDINGS: Bony demineralization. Distal femoral buttress plate laterally with cerclage fixation along the site of the prior oblique shaft fracture. Articular space narrowing in the knee especially in the medial compartment. Trace knee effusion. Substantial spurring in the patellofemoral joint. Distal SFA and popliteal artery atherosclerotic calcification. IMPRESSION: 1. Substantial osteoarthritis of the knee.  Small knee effusion. 2. Postoperative findings in the distal femur. 3. Atherosclerosis. Electronically Signed   By: Van Clines M.D.   On: 11/12/2021 18:40   DG Hip Unilat With Pelvis 2-3 Views Left  Result Date: 11/12/2021 CLINICAL DATA:  Fall. EXAM: DG HIP (WITH OR WITHOUT PELVIS) 2-3V LEFT COMPARISON:  Left hip x-ray 08/28/2020 FINDINGS: Bones are  diffusely osteopenic. Left hip arthroplasty is again seen in anatomic alignment. There is a lateral mid femoral sideplate, partially imaged. There is no evidence for hardware loosening. There is an acute oblique fracture through the lateral aspect of the proximal left femur just inferior to the intratrochanteric region. Fracture fragment is distracted 1 cm. There is a healed left inferior pubic ramus fracture. Right hip arthroplasty appears uncomplicated. IMPRESSION: 1. Acute fracture through the proximal left femur just inferior to the intratrochanteric region. 2. Left hip arthroplasty and femoral sideplate appear in anatomic alignment. Electronically Signed   By: Ronney Asters M.D.   On: 11/12/2021 18:41    Assessment/Plan 1. Periprosthetic fracture around internal prosthetic joint, subsequent encounter - S/p ORIF distal left femur March, 2022 - CT left hip revealed periprosthetic left femur fracture and left posterior - orthopedics recommended non surgical treatment - f/u with Dr. Lyla Glassing in 2 weeks - cont oxycodone and tramadol for pain - cont PT/OT- PWB 50%, no abduction x 6 weeks - awaiting for bed at [facility]  - cont miralax daily prn - will add senna 8.6 mg- 2 tablets po qhs due to opioid use  2. Left elbow pain - x ray unremarkable - followed by orthopedics - see above  3. Hematoma of left hip, subsequent encounter - d/t mechanical fall 05/18 - remains on coumadin - cont ice/heat applications  4. Acute blood loss anemia - hgb 7.2- related to hematoma - given 1 unit PRBC during hospitalization - cont ferrous sulfate - cbc/diff- future  5. H/O colon cancer, stage III - followed by Dr. Burr Medico - Pembrolizumab held at this time - asymptomatic at this time - next treatment scheduled 12/11/2021  6. Longstanding persistent atrial fibrillation: CHA2DS2-VASc Score 3 - HR controlled with bisoprolol - remains on coumadin for anticoagulation - PT/INR- future  7. S/P MVR (mitral  valve replacement) - s/p Bileaflet mechanical valve replacement  - on coumadin - goal INR (2.5-3.5)  8. Essential hypertension - controlled  - cont bisoprolol and HCTZ  9. Stage 3 chronic kidney disease, unspecified whether stage 3a or 3b CKD (HCC) - BUN/creat 34/1.20, was 2.06 (05/18) - avoid nephrotoxic drugs like NSAIDS and dose adjust medications to be renally excreted - encourage hydration with water   10. Hyponatremia - Na+ 135 11/17/2020  11. Protein-calorie malnutrition, severe ([place]) - albumin 2.7 11/17/2021 - consider adding Ensure/Boost - dietary consulted     Family/ staff Communication: plan discussed with patient and nurse  Labs/tests ordered:  cbc/diff, cmp, PT/INR 11/19/2021

## 2021-11-19 ENCOUNTER — Non-Acute Institutional Stay (SKILLED_NURSING_FACILITY): Payer: Medicare Other | Admitting: Internal Medicine

## 2021-11-19 ENCOUNTER — Ambulatory Visit: Payer: Medicare Other

## 2021-11-19 ENCOUNTER — Encounter: Payer: Self-pay | Admitting: Internal Medicine

## 2021-11-19 DIAGNOSIS — D649 Anemia, unspecified: Secondary | ICD-10-CM | POA: Diagnosis not present

## 2021-11-19 DIAGNOSIS — Z85038 Personal history of other malignant neoplasm of large intestine: Secondary | ICD-10-CM

## 2021-11-19 DIAGNOSIS — I4811 Longstanding persistent atrial fibrillation: Secondary | ICD-10-CM

## 2021-11-19 DIAGNOSIS — M6281 Muscle weakness (generalized): Secondary | ICD-10-CM | POA: Diagnosis not present

## 2021-11-19 DIAGNOSIS — R2681 Unsteadiness on feet: Secondary | ICD-10-CM | POA: Diagnosis not present

## 2021-11-19 DIAGNOSIS — I10 Essential (primary) hypertension: Secondary | ICD-10-CM | POA: Diagnosis not present

## 2021-11-19 DIAGNOSIS — D62 Acute posthemorrhagic anemia: Secondary | ICD-10-CM | POA: Diagnosis not present

## 2021-11-19 DIAGNOSIS — M9702XD Periprosthetic fracture around internal prosthetic left hip joint, subsequent encounter: Secondary | ICD-10-CM | POA: Diagnosis not present

## 2021-11-19 DIAGNOSIS — Z9181 History of falling: Secondary | ICD-10-CM | POA: Diagnosis not present

## 2021-11-19 DIAGNOSIS — Z952 Presence of prosthetic heart valve: Secondary | ICD-10-CM | POA: Diagnosis not present

## 2021-11-19 DIAGNOSIS — R41841 Cognitive communication deficit: Secondary | ICD-10-CM | POA: Diagnosis not present

## 2021-11-19 DIAGNOSIS — R7989 Other specified abnormal findings of blood chemistry: Secondary | ICD-10-CM | POA: Diagnosis not present

## 2021-11-19 DIAGNOSIS — M979XXD Periprosthetic fracture around unspecified internal prosthetic joint, subsequent encounter: Secondary | ICD-10-CM

## 2021-11-19 DIAGNOSIS — M9702XA Periprosthetic fracture around internal prosthetic left hip joint, initial encounter: Secondary | ICD-10-CM | POA: Diagnosis not present

## 2021-11-19 DIAGNOSIS — N1832 Chronic kidney disease, stage 3b: Secondary | ICD-10-CM | POA: Diagnosis not present

## 2021-11-19 DIAGNOSIS — N400 Enlarged prostate without lower urinary tract symptoms: Secondary | ICD-10-CM

## 2021-11-19 NOTE — Progress Notes (Signed)
Provider:  Veleta Miners MD  Location:   [place] Room Number: 36 Place of Service:  SNF (31)  PCP: [name], MD Patient Care Team: [name], MD as PCP - General (Internal Medicine) Leonie Man, MD as PCP - Cardiology (Cardiology) [name], MD as Consulting Physician (Hematology)  Extended Emergency Contact Information Primary Emergency Contact: [facility] Address: [address]          [place], [place] [address] Johnnette Litter of [place] Phone: [phone] Relation: Spouse Secondary Emergency Contact: [place],Newell  [place] Phone: [phone] Mobile Phone: [phone] Relation: Son  Code Status: DNR Goals of Care: Advanced Directive information    11/19/2021    9:22 AM  Advanced Directives  Does Patient Have a Medical Advance Directive? Yes  Type of Advance Directive Out of facility DNR (pink MOST or yellow form);Living will  Does patient want to make changes to medical advance directive? No - Patient declined  Pre-existing out of facility DNR order (yellow form or pink MOST form) Yellow form placed in chart (order not valid for inpatient use);Pink MOST form placed in chart (order not valid for inpatient use)      Chief Complaint  Patient presents with   New Admit To SNF    Admission to SNF    HPI: Patient is a 88 y.o. male seen today for admission to SNF  Admitted from 05/18-05/23 for Periprosthetic Fracture of left hip to be managed Conservatively  Patient has a history of PAF, mechanical mitral valve n Coumadin, BPH, previous history of right and left hip arthroplasty, hypertension 2/28-3/8 Closed Periprosthetic displaced fracture of distal epiphysis of left femur ORIF on 03/03 Recent Diagnosis of Colon Cancer with Metastatic to upper abdomen on Keytruda now for Palliative treatment  Patient had Mechanical fall in his Apartment He says he was standing and then just fell  in his bathroom No Dizziness no LOC Was found ot have Periprosthetic fracture Per Ortho non Surgical intervention.  Patient would be 50% partial weightbearing with two assist.  Patient is now in SNF did not have any acute complaints.  Pain seems to be controlled. Patient did get 1 unit of PRBC for anemia  Past Medical History:  Diagnosis Date   Anemia    leakoppenia   BPH (benign prostatic hypertrophy)    Bullous pemphigoid    [name], March 2011, right forearm squamous cell carcinoma   Chronic anticoagulation    systemic   Colon polyp    transverse, 2002   History of peptic ulcer    remote, 3/95   Hx of actinic keratosis    Hx of basal cell carcinoma    Hx of squamous cell carcinoma of skin    Hyperlipidemia    Left inguinal hernia    Moderate aortic insufficiency 2009   audible aortic insufficiency on 1/09 echo   PAF (paroxysmal atrial fibrillation) ([facility]) 01/17/2014   On Warfarin.   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 Jude mechanical valve   ORIF FEMUR FRACTURE Left 08/28/2020   Procedure: OPEN REDUCTION INTERNAL FIXATION (ORIF) DISTAL FEMUR FRACTURE;  Surgeon: Rod Can, MD;  Location: [facility];  Service: Orthopedics;  Laterality: Left;   PARTIAL COLECTOMY N/A 06/30/2021   Procedure: PARTIAL  COLECTOMY;  Surgeon: Clovis Riley, MD;  Location: [facility];  Service: General;  Laterality: N/A;   TOTAL HIP ARTHROPLASTY Right 10/12/2017   Procedure: RIGHT TOTAL HIP ARTHROPLASTY ANTERIOR APPROACH;  Surgeon: Gaynelle Arabian, MD;  Location: [facility];  Service: Orthopedics;  Laterality: Right;   TRANSTHORACIC ECHOCARDIOGRAM  12/2018   Unable to assess diastolic function because of A. fib. Normal RV function, but moderately elevated RVSP.  Severe biatrial enlargement. S/P St Jude bileaflet mechanical MVR that appears to be functioning normally. Mitral valve regurgitation cannot assess due to mechanical valve shadowing. MV Mean grad: 7.0 mmHg MV Area (PHT): 3.38 cm (stable for valve).  Mild Ao Sclerosis, Mild-Mod AI   TRANSTHORACIC ECHOCARDIOGRAM  08/'17; 10/'18    a) Mild conc LVH. EF 55-60%. No RWMA. Mod AI. Mechanical MV prosthesis functioning properly. LAD dilation.;; b)  EF 55-60%.  Mo AI.  Bileaflet Saint Jude mechanical MV with no paravalvular leak.  Severe LA dilation.  Minimally elevated PAP    reports that he quit smoking about 63 years ago. His smoking use included cigarettes. He started smoking about 76 years ago. He has a 13.00 pack-year smoking history. He has never used smokeless tobacco. He reports current alcohol use. He reports that he does not use drugs. Social History   Socioeconomic History   Marital status: Married    Spouse name: Not on file   Number of children: 2   Years of education: Not on file   Highest education level: Not on file  Occupational History   Occupation: retired  Tobacco Use   Smoking status: Former    Packs/day: 1.00    Years: 13.00    Pack years: 13.00    Types: Cigarettes    Start date: 1947    Quit date: 1960    Years since quitting: 63.4   Smokeless tobacco: Never  Vaping Use   Vaping Use: Never used  Substance and Sexual Activity   Alcohol use: Yes    Alcohol/week: 0.0 standard drinks    Comment: 1-2 drinks per day   Drug use: Never    Sexual activity: Not on file  Other Topics Concern   Not on file  Social History Narrative   Patient lives at [facility], With his wife - Meryl Crutch.   Social Determinants of Health   Financial Resource Strain: Not on file  Food Insecurity: Not on file  Transportation Needs: Not on file  Physical Activity: Not on file  Stress: Not on file  Social Connections: Not on file  Intimate Partner Violence: Not on file    Functional Status Survey:    Family History  Problem Relation Age of Onset   Hypertension Mother    Lung cancer Sister    COPD Brother    Cancer Brother    Other Sister        polio   Lupus Son     Health Maintenance  Topic Date Due  Zoster Vaccines- Shingrix (1 of 2) 08/14/1944   COVID-19 Vaccine (4 - Booster for Moderna series) 01/28/2021   INFLUENZA VACCINE  01/26/2022   TETANUS/TDAP  06/10/2024   Pneumonia Vaccine 54+ Years old  Completed   HPV VACCINES  Aged Out    Allergies  Allergen Reactions   Flexeril [Cyclobenzaprine] Diarrhea    Allergies as of 11/19/2021       Reactions   Flexeril [cyclobenzaprine] Diarrhea        Medication List        Accurate as of Nov 19, 2021  9:22 AM. If you have any questions, ask your nurse or doctor.          acetaminophen 500 MG tablet Commonly known as: TYLENOL Take 1,000 mg by mouth 3 (three) times daily as needed for moderate pain.   bisoprolol 5 MG tablet Commonly known as: ZEBETA Take 1 tablet (5 mg total) by mouth daily.   ferrous sulfate 325 (65 FE) MG tablet Take 1 tablet (325 mg total) by mouth daily with breakfast.   fluticasone 50 MCG/ACT nasal spray Commonly known as: FLONASE Place 1 spray into both nostrils daily as needed for allergies or rhinitis.   furosemide 40 MG tablet Commonly known as: LASIX Take 20-40 mg by mouth See admin instructions. Take 20 mg on Monday Wednesday and Friday and 40 mg on all other days.   hydrALAZINE 25 MG tablet Commonly known as:  APRESOLINE Take 25 mg by mouth daily at lunch, may take an  extra tablet as needed for systolic BP greater than 637 mmHg daily   isosorbide mononitrate 30 MG 24 hr tablet Commonly known as: IMDUR Take 30 mg by mouth daily.   lactose free nutrition Liqd Take 237 mLs by mouth daily.   loperamide 2 MG capsule Commonly known as: IMODIUM Take 1 capsule (2 mg total) by mouth as needed for diarrhea or loose stools.   LORazepam 1 MG tablet Commonly known as: ATIVAN Take 0.5 tablets (0.5 mg total) by mouth daily as needed for anxiety.   MULTIVITAMIN PO Take 1 tablet by mouth daily.   oxyCODONE 5 MG immediate release tablet Commonly known as: Oxy IR/ROXICODONE Take 1/2 to 1 tablet  by mouth every 8 hours as needed for severe pain.   polyethylene glycol 17 g packet Commonly known as: MIRALAX / GLYCOLAX Take 17 g by mouth daily as needed for mild constipation.   PreserVision AREDS 2 Caps Take 1 capsule by mouth daily.   senna 8.6 MG Tabs tablet Commonly known as: SENOKOT Take 2 tablets (17.2 mg total) by mouth at bedtime.   traMADol 50 MG tablet Commonly known as: ULTRAM Take 50 mg by mouth every 8 (eight) hours as needed.   warfarin 2.5 MG tablet Commonly known as: COUMADIN Take as directed by the anticoagulation clinic. If you are unsure how to take this medication, talk to your nurse or doctor. Original instructions: Take 2.5 mg by mouth. Once A Day on Sun, Tue, Thu, Sat What changed: Another medication with the same name was removed. Continue taking this medication, and follow the directions you see here. Changed by: Virgie Dad, MD   warfarin 5 MG tablet Commonly known as: COUMADIN Take as directed by the anticoagulation clinic. If you are unsure how to take this medication, talk to your nurse or doctor. Original instructions: Take 5 mg by mouth. Once A Day on Mon, Wed, Fri What changed: Another medication with the same name was removed. Continue  taking this medication,  and follow the directions you see here. Changed by: Virgie Dad, MD   zinc oxide 20 % ointment Apply 1 application. topically as needed for irritation.        Review of Systems  Constitutional:  Positive for activity change and appetite change. Negative for unexpected weight change.  HENT: Negative.    Respiratory:  Negative for cough and shortness of breath.   Cardiovascular:  Negative for leg swelling.  Gastrointestinal:  Positive for diarrhea. Negative for constipation.  Genitourinary:  Negative for frequency.  Musculoskeletal:  Positive for gait problem. Negative for arthralgias and myalgias.  Skin: Negative.  Negative for rash.  Neurological:  Negative for dizziness and weakness.  Psychiatric/Behavioral:  Negative for confusion and sleep disturbance.   All other systems reviewed and are negative.  Vitals:   11/19/21 0903  BP: (!) 130/59  Pulse: 93  Resp: 20  Temp: (!) 97.1 F (36.2 C)  SpO2: 95%  Weight: 134 lb 12.8 oz (61.1 kg)  Height: '5\' 11"'$  (1.803 m)   Body mass index is 18.8 kg/m. Physical Exam Vitals reviewed.  Constitutional:      Appearance: Normal appearance.  HENT:     Head: Normocephalic.     Nose: Nose normal.     Mouth/Throat:     Mouth: Mucous membranes are moist.     Pharynx: Oropharynx is clear.  Eyes:     Pupils: Pupils are equal, round, and reactive to light.  Cardiovascular:     Rate and Rhythm: Normal rate and regular rhythm.     Pulses: Normal pulses.     Heart sounds: Murmur heard.  Pulmonary:     Effort: Pulmonary effort is normal. No respiratory distress.     Breath sounds: Normal breath sounds. No rales.  Abdominal:     General: Abdomen is flat. Bowel sounds are normal.     Palpations: Abdomen is soft.  Musculoskeletal:        General: No swelling.     Cervical back: Neck supple.     Comments: Chronic Venous changes bilateral  Skin:    General: Skin is warm.  Neurological:     Mental Status: He is alert and oriented to  person, place, and time.  Psychiatric:        Mood and Affect: Mood normal.        Thought Content: Thought content normal.    Labs reviewed: Basic Metabolic Panel: Recent Labs    07/06/21 0156 07/06/21 1216 11/13/21 0347 11/14/21 0331 11/15/21 0306 11/16/21 0333 11/17/21 0311  NA  --    < > 134*   < > 138 136 135  K  --    < > 3.8   < > 4.5 4.6 4.9  CL  --    < > 103   < > 108 105 106  CO2  --    < > 26   < > '27 25 25  '$ GLUCOSE  --    < > 97   < > 112* 126* 97  BUN  --    < > 43*   < > 32* 29* 34*  CREATININE  --    < > 1.60*   < > 1.30* 1.22 1.20  CALCIUM  --    < > 8.2*   < > 8.5* 8.6* 8.6*  MG  --    < > 2.3  --   --  2.3 2.2  PHOS 3.0  --   --   --   --  2.7 2.7   < > = values in this interval not displayed.   Liver Function Tests: Recent Labs    10/29/21 0834 11/16/21 0333 11/17/21 0311  AST '27 23 26  '$ ALT '15 13 16  '$ ALKPHOS 58 43 44  BILITOT 0.8 0.9 0.8  PROT 7.2 5.7* 5.9*  ALBUMIN 3.7 2.7* 2.7*   Recent Labs    06/24/21 1220  LIPASE 34   No results for input(s): AMMONIA in the last 8760 hours. CBC: Recent Labs    11/12/21 1707 11/13/21 0347 11/15/21 0306 11/16/21 0333 11/17/21 0311  WBC 4.7   < > 3.1* 3.0* 3.5*  NEUTROABS 3.4  --   --  1.9 2.2  HGB 10.0*   < > 7.9* 8.1* 8.2*  HCT 31.4*   < > 24.9* 25.1* 26.3*  MCV 100.3*   < > 98.4 98.0 97.8  PLT 161   < > 153 145* 154   < > = values in this interval not displayed.   Cardiac Enzymes: No results for input(s): CKTOTAL, CKMB, CKMBINDEX, TROPONINI in the last 8760 hours. BNP: Invalid input(s): POCBNP No results found for: HGBA1C Lab Results  Component Value Date   TSH 14.552 (H) 10/29/2021   Lab Results  Component Value Date   UEKCMKLK91 791 06/25/2021   Lab Results  Component Value Date   FOLATE >24.0 02/05/2021   Lab Results  Component Value Date   IRON 68 09/09/2021   TIBC 395 09/09/2021   FERRITIN 159 10/29/2021    Imaging and Procedures obtained prior to SNF admission: DG  Chest 1 View  Result Date: 11/12/2021 CLINICAL DATA:  Fall. EXAM: CHEST  1 VIEW COMPARISON:  PET-CT 10/16/2021 FINDINGS: Cardiomegaly. Atherosclerotic calcification of the aortic arch. Prosthetic mitral valve. Prior median sternotomy. Hazy opacities along the mid lungs are probably from skin folds. Old healed right rib fractures. IMPRESSION: 1. Stable cardiomegaly. 2. Hazy densities along the mid lungs bilaterally, probably skin folds. 3.  Aortic Atherosclerosis (ICD10-I70.0). Electronically Signed   By: Van Clines M.D.   On: 11/12/2021 18:42   CT Head Wo Contrast  Result Date: 11/12/2021 CLINICAL DATA:  Fall. EXAM: CT HEAD WITHOUT CONTRAST CT CERVICAL SPINE WITHOUT CONTRAST TECHNIQUE: Multidetector CT imaging of the head and cervical spine was performed following the standard protocol without intravenous contrast. Multiplanar CT image reconstructions of the cervical spine were also generated. RADIATION DOSE REDUCTION: This exam was performed according to the departmental dose-optimization program which includes automated exposure control, adjustment of the mA and/or kV according to patient size and/or use of iterative reconstruction technique. COMPARISON:  None Available. FINDINGS: CT HEAD FINDINGS Brain: No evidence of acute infarction, hemorrhage, hydrocephalus, extra-axial collection or mass lesion/mass effect. There is moderate diffuse atrophy. There is mild periventricular white matter hypodensity, likely chronic small vessel ischemic change. Vascular: Atherosclerotic calcifications are present within the cavernous internal carotid arteries. Skull: Normal. Negative for fracture or focal lesion. Sinuses/Orbits: No acute finding. Other: None. CT CERVICAL SPINE FINDINGS Alignment: There is trace anterolisthesis at C6-C7 which is likely degenerative. Alignment is otherwise anatomic. Skull base and vertebrae: Bones are osteopenic. No acute fracture. No primary bone lesion or focal pathologic process.  Soft tissues and spinal canal: No prevertebral fluid or swelling. No visible canal hematoma. Disc levels: There is severe disc space narrowing at C3-C4, C4-C5 and C5-C6. No significant central canal or neural foraminal stenosis at any level. Upper chest: Negative. Other: None. IMPRESSION: 1.  No acute intracranial process. 2. No acute fracture  or traumatic subluxation of the cervical spine. 3. Moderate diffuse brain atrophy. Mild chronic small vessel ischemic change. 4.  Degenerative changes of the cervical spine as above. Electronically Signed   By: Ronney Asters M.D.   On: 11/12/2021 17:52   CT Cervical Spine Wo Contrast  Result Date: 11/12/2021 CLINICAL DATA:  Fall. EXAM: CT HEAD WITHOUT CONTRAST CT CERVICAL SPINE WITHOUT CONTRAST TECHNIQUE: Multidetector CT imaging of the head and cervical spine was performed following the standard protocol without intravenous contrast. Multiplanar CT image reconstructions of the cervical spine were also generated. RADIATION DOSE REDUCTION: This exam was performed according to the departmental dose-optimization program which includes automated exposure control, adjustment of the mA and/or kV according to patient size and/or use of iterative reconstruction technique. COMPARISON:  None Available. FINDINGS: CT HEAD FINDINGS Brain: No evidence of acute infarction, hemorrhage, hydrocephalus, extra-axial collection or mass lesion/mass effect. There is moderate diffuse atrophy. There is mild periventricular white matter hypodensity, likely chronic small vessel ischemic change. Vascular: Atherosclerotic calcifications are present within the cavernous internal carotid arteries. Skull: Normal. Negative for fracture or focal lesion. Sinuses/Orbits: No acute finding. Other: None. CT CERVICAL SPINE FINDINGS Alignment: There is trace anterolisthesis at C6-C7 which is likely degenerative. Alignment is otherwise anatomic. Skull base and vertebrae: Bones are osteopenic. No acute fracture. No  primary bone lesion or focal pathologic process. Soft tissues and spinal canal: No prevertebral fluid or swelling. No visible canal hematoma. Disc levels: There is severe disc space narrowing at C3-C4, C4-C5 and C5-C6. No significant central canal or neural foraminal stenosis at any level. Upper chest: Negative. Other: None. IMPRESSION: 1.  No acute intracranial process. 2. No acute fracture or traumatic subluxation of the cervical spine. 3. Moderate diffuse brain atrophy. Mild chronic small vessel ischemic change. 4.  Degenerative changes of the cervical spine as above. Electronically Signed   By: Ronney Asters M.D.   On: 11/12/2021 17:52   CT FEMUR LEFT WO CONTRAST  Result Date: 11/12/2021 CLINICAL DATA:  Trauma, fall, possible fracture. Unable to bear weight. EXAM: CT OF THE LOWER LEFT EXTREMITY WITHOUT CONTRAST TECHNIQUE: Multidetector CT imaging of the lower left extremity was performed according to the standard protocol. RADIATION DOSE REDUCTION: This exam was performed according to the departmental dose-optimization program which includes automated exposure control, adjustment of the mA and/or kV according to patient size and/or use of iterative reconstruction technique. COMPARISON:  Radiographs 08/28/2020 FINDINGS: Bones/Joint/Cartilage Streak artifact associated with the left total hip prosthesis left lateral buttress plate and screw fixator noted. Old healed left inferior pubic ramus fracture, image 72 series 2. Left femur proximal periprosthetic fracture involving the greater trochanter and anterolateral left proximal femoral metaphysis, with about 1.2 cm of cortical step-off anteriorly on image 74 series 4. Suspected vertical nondisplaced component of this fracture extending distally in the femoral shaft anteromedial to the stem of the prosthesis for example on image 126 of series 2. This becomes indistinct distally. There is a previous oblique fracture of the femoral diaphysis with healing response  noted along the upper fracture margins for example on image 184 of series 2, traversed by the buttress plate and with cerclage is in the vicinity of the fracture. On image 219 of series 2, there is a 1.8 by 3.5 cm cortical defect in the anterior femoral shaft which is probably chronic. Osteoarthritis of the knee is present. Ligaments Suboptimally assessed by CT. Muscles and Tendons Unremarkable Soft tissues Indirect left inguinal hernia contains loop of bowel without strangulation or  obstruction. Substantial atherosclerosis. 7.9 by 5.1 by 14.4 cm (volume = 300 cm^3) mixed density subcutaneous structure along the left buttock and upper thigh region favoring hematoma. This partially abuts the superficial fascia margin. There is substantial surrounding subcutaneous edema along the buttock and tracking in the left lateral thigh. IMPRESSION: 1. 300 cc mixed density subcutaneous lesion along the left posterior buttock/upper thigh region compatible with hematoma. Cannot completely exclude Sherry Ruffing lesion as this abuts the superficial fascia margin. Surrounding subcutaneous edema noted. 2. Primarily vertically oriented fracture extending through the greater tuberosity and into the anterior cortex of the proximal femoral metaphysis, with about 1.5 cm of step-off anteriorly, most compatible with a periprosthetic fracture. Suspected small nondisplaced vertical component extending distally in the femoral shaft anteromedially. 3. Remote mid to distal femoral fracture with buttress plate and screw fixation. Likely chronic anterior cortical defect in the distal femoral metaphysis. 4. Left inguinal hernia contains bowel. 5. Old left inferior pubic ramus fracture. Electronically Signed   By: Van Clines M.D.   On: 11/12/2021 18:06   DG Knee Complete 4 Views Left  Result Date: 11/12/2021 CLINICAL DATA:  Fall EXAM: LEFT KNEE - COMPLETE 4+ VIEW COMPARISON:  CT scan from 11/12/2021 FINDINGS: Bony demineralization.  Distal femoral buttress plate laterally with cerclage fixation along the site of the prior oblique shaft fracture. Articular space narrowing in the knee especially in the medial compartment. Trace knee effusion. Substantial spurring in the patellofemoral joint. Distal SFA and popliteal artery atherosclerotic calcification. IMPRESSION: 1. Substantial osteoarthritis of the knee.  Small knee effusion. 2. Postoperative findings in the distal femur. 3. Atherosclerosis. Electronically Signed   By: Van Clines M.D.   On: 11/12/2021 18:40   DG Hip Unilat With Pelvis 2-3 Views Left  Result Date: 11/12/2021 CLINICAL DATA:  Fall. EXAM: DG HIP (WITH OR WITHOUT PELVIS) 2-3V LEFT COMPARISON:  Left hip x-ray 08/28/2020 FINDINGS: Bones are diffusely osteopenic. Left hip arthroplasty is again seen in anatomic alignment. There is a lateral mid femoral sideplate, partially imaged. There is no evidence for hardware loosening. There is an acute oblique fracture through the lateral aspect of the proximal left femur just inferior to the intratrochanteric region. Fracture fragment is distracted 1 cm. There is a healed left inferior pubic ramus fracture. Right hip arthroplasty appears uncomplicated. IMPRESSION: 1. Acute fracture through the proximal left femur just inferior to the intratrochanteric region. 2. Left hip arthroplasty and femoral sideplate appear in anatomic alignment. Electronically Signed   By: Ronney Asters M.D.   On: 11/12/2021 18:41    Assessment/Plan 1. Periprosthetic fracture around internal prosthetic joint, subsequent encounter Conservative management 50% weight bearing 2 person Assist Pain managed with Oxycodone and Tramadol Follow with Ortho Already on Coumadin  2. Acute blood loss anemia Got PRBC in hospital Repeat CBC pending On Iron 3. H/O colon cancer, stage III On Immunotherapy It is on hold  4. Longstanding persistent atrial fibrillation: CHA2DS2-VASc Score 3 On Coumadin and  Bisoprolol  5. S/P MVR (mitral valve replacement) Coumadin 2.5 -3.5 INR target  6. Essential hypertension On Bisoprolol and Hydralazine  7. Stage 3b chronic kidney disease (HCC) Creat stable  8. Benign prostatic hyperplasia without lower urinary tract symptoms Having difficulty due to not able to stand Will continue to monitor  9. Elevated TSH ? Due to immunotherapy Possible to start on Synthroid Will repeat    Family/ staff Communication:   Labs/tests ordered:

## 2021-11-20 DIAGNOSIS — Z9181 History of falling: Secondary | ICD-10-CM | POA: Diagnosis not present

## 2021-11-20 DIAGNOSIS — M6281 Muscle weakness (generalized): Secondary | ICD-10-CM | POA: Diagnosis not present

## 2021-11-20 DIAGNOSIS — R2681 Unsteadiness on feet: Secondary | ICD-10-CM | POA: Diagnosis not present

## 2021-11-20 DIAGNOSIS — M9702XA Periprosthetic fracture around internal prosthetic left hip joint, initial encounter: Secondary | ICD-10-CM | POA: Diagnosis not present

## 2021-11-20 DIAGNOSIS — R41841 Cognitive communication deficit: Secondary | ICD-10-CM | POA: Diagnosis not present

## 2021-11-20 DIAGNOSIS — M9702XD Periprosthetic fracture around internal prosthetic left hip joint, subsequent encounter: Secondary | ICD-10-CM | POA: Diagnosis not present

## 2021-11-20 LAB — HEPATIC FUNCTION PANEL
ALT: 17 U/L (ref 10–40)
AST: 29 (ref 14–40)
Alkaline Phosphatase: 51 (ref 25–125)
Bilirubin, Total: 1.2

## 2021-11-20 LAB — CBC AND DIFFERENTIAL
HCT: 27 — AB (ref 41–53)
Hemoglobin: 8.7 — AB (ref 13.5–17.5)
Platelets: 190 10*3/uL (ref 150–400)
WBC: 3.5

## 2021-11-20 LAB — CBC: RBC: 2.76 — AB (ref 3.87–5.11)

## 2021-11-20 LAB — BASIC METABOLIC PANEL
BUN: 35 — AB (ref 4–21)
CO2: 22 (ref 13–22)
Chloride: 105 (ref 99–108)
Creatinine: 1.1 (ref 0.6–1.3)
Glucose: 77
Potassium: 4.3 mEq/L (ref 3.5–5.1)
Sodium: 137 (ref 137–147)

## 2021-11-20 LAB — COMPREHENSIVE METABOLIC PANEL
Albumin: 3.1 — AB (ref 3.5–5.0)
Calcium: 8.3 — AB (ref 8.7–10.7)
Globulin: 2.9
eGFR: 66

## 2021-11-22 DIAGNOSIS — M9702XA Periprosthetic fracture around internal prosthetic left hip joint, initial encounter: Secondary | ICD-10-CM | POA: Diagnosis not present

## 2021-11-22 DIAGNOSIS — R41841 Cognitive communication deficit: Secondary | ICD-10-CM | POA: Diagnosis not present

## 2021-11-22 DIAGNOSIS — M6281 Muscle weakness (generalized): Secondary | ICD-10-CM | POA: Diagnosis not present

## 2021-11-22 DIAGNOSIS — M9702XD Periprosthetic fracture around internal prosthetic left hip joint, subsequent encounter: Secondary | ICD-10-CM | POA: Diagnosis not present

## 2021-11-22 DIAGNOSIS — Z9181 History of falling: Secondary | ICD-10-CM | POA: Diagnosis not present

## 2021-11-22 DIAGNOSIS — R2681 Unsteadiness on feet: Secondary | ICD-10-CM | POA: Diagnosis not present

## 2021-11-23 DIAGNOSIS — M9702XA Periprosthetic fracture around internal prosthetic left hip joint, initial encounter: Secondary | ICD-10-CM | POA: Diagnosis not present

## 2021-11-23 DIAGNOSIS — M6281 Muscle weakness (generalized): Secondary | ICD-10-CM | POA: Diagnosis not present

## 2021-11-23 DIAGNOSIS — M9702XD Periprosthetic fracture around internal prosthetic left hip joint, subsequent encounter: Secondary | ICD-10-CM | POA: Diagnosis not present

## 2021-11-23 DIAGNOSIS — Z9181 History of falling: Secondary | ICD-10-CM | POA: Diagnosis not present

## 2021-11-23 DIAGNOSIS — R41841 Cognitive communication deficit: Secondary | ICD-10-CM | POA: Diagnosis not present

## 2021-11-23 DIAGNOSIS — R2681 Unsteadiness on feet: Secondary | ICD-10-CM | POA: Diagnosis not present

## 2021-11-24 DIAGNOSIS — Z9181 History of falling: Secondary | ICD-10-CM | POA: Diagnosis not present

## 2021-11-24 DIAGNOSIS — M9702XD Periprosthetic fracture around internal prosthetic left hip joint, subsequent encounter: Secondary | ICD-10-CM | POA: Diagnosis not present

## 2021-11-24 DIAGNOSIS — M6281 Muscle weakness (generalized): Secondary | ICD-10-CM | POA: Diagnosis not present

## 2021-11-24 DIAGNOSIS — R2681 Unsteadiness on feet: Secondary | ICD-10-CM | POA: Diagnosis not present

## 2021-11-24 DIAGNOSIS — M9702XA Periprosthetic fracture around internal prosthetic left hip joint, initial encounter: Secondary | ICD-10-CM | POA: Diagnosis not present

## 2021-11-24 DIAGNOSIS — R41841 Cognitive communication deficit: Secondary | ICD-10-CM | POA: Diagnosis not present

## 2021-11-25 DIAGNOSIS — R41841 Cognitive communication deficit: Secondary | ICD-10-CM | POA: Diagnosis not present

## 2021-11-25 DIAGNOSIS — M9702XA Periprosthetic fracture around internal prosthetic left hip joint, initial encounter: Secondary | ICD-10-CM | POA: Diagnosis not present

## 2021-11-25 DIAGNOSIS — M9702XD Periprosthetic fracture around internal prosthetic left hip joint, subsequent encounter: Secondary | ICD-10-CM | POA: Diagnosis not present

## 2021-11-25 DIAGNOSIS — M6281 Muscle weakness (generalized): Secondary | ICD-10-CM | POA: Diagnosis not present

## 2021-11-25 DIAGNOSIS — R2681 Unsteadiness on feet: Secondary | ICD-10-CM | POA: Diagnosis not present

## 2021-11-25 DIAGNOSIS — Z9181 History of falling: Secondary | ICD-10-CM | POA: Diagnosis not present

## 2021-11-26 DIAGNOSIS — S300XXD Contusion of lower back and pelvis, subsequent encounter: Secondary | ICD-10-CM | POA: Diagnosis not present

## 2021-11-26 DIAGNOSIS — R2681 Unsteadiness on feet: Secondary | ICD-10-CM | POA: Diagnosis not present

## 2021-11-26 DIAGNOSIS — M6281 Muscle weakness (generalized): Secondary | ICD-10-CM | POA: Diagnosis not present

## 2021-11-26 DIAGNOSIS — M9702XA Periprosthetic fracture around internal prosthetic left hip joint, initial encounter: Secondary | ICD-10-CM | POA: Diagnosis not present

## 2021-11-26 DIAGNOSIS — R41841 Cognitive communication deficit: Secondary | ICD-10-CM | POA: Diagnosis not present

## 2021-11-26 DIAGNOSIS — M9702XD Periprosthetic fracture around internal prosthetic left hip joint, subsequent encounter: Secondary | ICD-10-CM | POA: Diagnosis not present

## 2021-11-26 DIAGNOSIS — Z9181 History of falling: Secondary | ICD-10-CM | POA: Diagnosis not present

## 2021-11-27 DIAGNOSIS — R2681 Unsteadiness on feet: Secondary | ICD-10-CM | POA: Diagnosis not present

## 2021-11-27 DIAGNOSIS — M9702XA Periprosthetic fracture around internal prosthetic left hip joint, initial encounter: Secondary | ICD-10-CM | POA: Diagnosis not present

## 2021-11-27 DIAGNOSIS — M6281 Muscle weakness (generalized): Secondary | ICD-10-CM | POA: Diagnosis not present

## 2021-11-27 DIAGNOSIS — M9702XD Periprosthetic fracture around internal prosthetic left hip joint, subsequent encounter: Secondary | ICD-10-CM | POA: Diagnosis not present

## 2021-11-27 DIAGNOSIS — R41841 Cognitive communication deficit: Secondary | ICD-10-CM | POA: Diagnosis not present

## 2021-11-27 DIAGNOSIS — Z9181 History of falling: Secondary | ICD-10-CM | POA: Diagnosis not present

## 2021-11-30 DIAGNOSIS — M9702XA Periprosthetic fracture around internal prosthetic left hip joint, initial encounter: Secondary | ICD-10-CM | POA: Diagnosis not present

## 2021-11-30 DIAGNOSIS — R41841 Cognitive communication deficit: Secondary | ICD-10-CM | POA: Diagnosis not present

## 2021-11-30 DIAGNOSIS — Z9181 History of falling: Secondary | ICD-10-CM | POA: Diagnosis not present

## 2021-11-30 DIAGNOSIS — M6281 Muscle weakness (generalized): Secondary | ICD-10-CM | POA: Diagnosis not present

## 2021-11-30 DIAGNOSIS — M9702XD Periprosthetic fracture around internal prosthetic left hip joint, subsequent encounter: Secondary | ICD-10-CM | POA: Diagnosis not present

## 2021-11-30 DIAGNOSIS — R2681 Unsteadiness on feet: Secondary | ICD-10-CM | POA: Diagnosis not present

## 2021-12-01 DIAGNOSIS — R41841 Cognitive communication deficit: Secondary | ICD-10-CM | POA: Diagnosis not present

## 2021-12-01 DIAGNOSIS — M9702XD Periprosthetic fracture around internal prosthetic left hip joint, subsequent encounter: Secondary | ICD-10-CM | POA: Diagnosis not present

## 2021-12-01 DIAGNOSIS — R2681 Unsteadiness on feet: Secondary | ICD-10-CM | POA: Diagnosis not present

## 2021-12-01 DIAGNOSIS — Z9181 History of falling: Secondary | ICD-10-CM | POA: Diagnosis not present

## 2021-12-01 DIAGNOSIS — M9702XA Periprosthetic fracture around internal prosthetic left hip joint, initial encounter: Secondary | ICD-10-CM | POA: Diagnosis not present

## 2021-12-01 DIAGNOSIS — M6281 Muscle weakness (generalized): Secondary | ICD-10-CM | POA: Diagnosis not present

## 2021-12-02 DIAGNOSIS — M9702XA Periprosthetic fracture around internal prosthetic left hip joint, initial encounter: Secondary | ICD-10-CM | POA: Diagnosis not present

## 2021-12-02 DIAGNOSIS — M6281 Muscle weakness (generalized): Secondary | ICD-10-CM | POA: Diagnosis not present

## 2021-12-02 DIAGNOSIS — R2681 Unsteadiness on feet: Secondary | ICD-10-CM | POA: Diagnosis not present

## 2021-12-02 DIAGNOSIS — Z9181 History of falling: Secondary | ICD-10-CM | POA: Diagnosis not present

## 2021-12-02 DIAGNOSIS — M9702XD Periprosthetic fracture around internal prosthetic left hip joint, subsequent encounter: Secondary | ICD-10-CM | POA: Diagnosis not present

## 2021-12-02 DIAGNOSIS — R41841 Cognitive communication deficit: Secondary | ICD-10-CM | POA: Diagnosis not present

## 2021-12-03 DIAGNOSIS — M9702XA Periprosthetic fracture around internal prosthetic left hip joint, initial encounter: Secondary | ICD-10-CM | POA: Diagnosis not present

## 2021-12-03 DIAGNOSIS — R41841 Cognitive communication deficit: Secondary | ICD-10-CM | POA: Diagnosis not present

## 2021-12-03 DIAGNOSIS — M6281 Muscle weakness (generalized): Secondary | ICD-10-CM | POA: Diagnosis not present

## 2021-12-03 DIAGNOSIS — R2681 Unsteadiness on feet: Secondary | ICD-10-CM | POA: Diagnosis not present

## 2021-12-03 DIAGNOSIS — Z9181 History of falling: Secondary | ICD-10-CM | POA: Diagnosis not present

## 2021-12-03 DIAGNOSIS — M9702XD Periprosthetic fracture around internal prosthetic left hip joint, subsequent encounter: Secondary | ICD-10-CM | POA: Diagnosis not present

## 2021-12-04 DIAGNOSIS — M6281 Muscle weakness (generalized): Secondary | ICD-10-CM | POA: Diagnosis not present

## 2021-12-04 DIAGNOSIS — R41841 Cognitive communication deficit: Secondary | ICD-10-CM | POA: Diagnosis not present

## 2021-12-04 DIAGNOSIS — M9702XA Periprosthetic fracture around internal prosthetic left hip joint, initial encounter: Secondary | ICD-10-CM | POA: Diagnosis not present

## 2021-12-04 DIAGNOSIS — R2681 Unsteadiness on feet: Secondary | ICD-10-CM | POA: Diagnosis not present

## 2021-12-04 DIAGNOSIS — M9702XD Periprosthetic fracture around internal prosthetic left hip joint, subsequent encounter: Secondary | ICD-10-CM | POA: Diagnosis not present

## 2021-12-04 DIAGNOSIS — Z9181 History of falling: Secondary | ICD-10-CM | POA: Diagnosis not present

## 2021-12-07 DIAGNOSIS — M9702XD Periprosthetic fracture around internal prosthetic left hip joint, subsequent encounter: Secondary | ICD-10-CM | POA: Diagnosis not present

## 2021-12-07 DIAGNOSIS — M9702XA Periprosthetic fracture around internal prosthetic left hip joint, initial encounter: Secondary | ICD-10-CM | POA: Diagnosis not present

## 2021-12-07 DIAGNOSIS — Z9181 History of falling: Secondary | ICD-10-CM | POA: Diagnosis not present

## 2021-12-07 DIAGNOSIS — R2681 Unsteadiness on feet: Secondary | ICD-10-CM | POA: Diagnosis not present

## 2021-12-07 DIAGNOSIS — R41841 Cognitive communication deficit: Secondary | ICD-10-CM | POA: Diagnosis not present

## 2021-12-07 DIAGNOSIS — M6281 Muscle weakness (generalized): Secondary | ICD-10-CM | POA: Diagnosis not present

## 2021-12-08 DIAGNOSIS — R2681 Unsteadiness on feet: Secondary | ICD-10-CM | POA: Diagnosis not present

## 2021-12-08 DIAGNOSIS — Z9181 History of falling: Secondary | ICD-10-CM | POA: Diagnosis not present

## 2021-12-08 DIAGNOSIS — R41841 Cognitive communication deficit: Secondary | ICD-10-CM | POA: Diagnosis not present

## 2021-12-08 DIAGNOSIS — M9702XD Periprosthetic fracture around internal prosthetic left hip joint, subsequent encounter: Secondary | ICD-10-CM | POA: Diagnosis not present

## 2021-12-08 DIAGNOSIS — M9702XA Periprosthetic fracture around internal prosthetic left hip joint, initial encounter: Secondary | ICD-10-CM | POA: Diagnosis not present

## 2021-12-08 DIAGNOSIS — M6281 Muscle weakness (generalized): Secondary | ICD-10-CM | POA: Diagnosis not present

## 2021-12-09 DIAGNOSIS — M6281 Muscle weakness (generalized): Secondary | ICD-10-CM | POA: Diagnosis not present

## 2021-12-09 DIAGNOSIS — Z9181 History of falling: Secondary | ICD-10-CM | POA: Diagnosis not present

## 2021-12-09 DIAGNOSIS — M9702XD Periprosthetic fracture around internal prosthetic left hip joint, subsequent encounter: Secondary | ICD-10-CM | POA: Diagnosis not present

## 2021-12-09 DIAGNOSIS — R41841 Cognitive communication deficit: Secondary | ICD-10-CM | POA: Diagnosis not present

## 2021-12-09 DIAGNOSIS — M9702XA Periprosthetic fracture around internal prosthetic left hip joint, initial encounter: Secondary | ICD-10-CM | POA: Diagnosis not present

## 2021-12-09 DIAGNOSIS — R2681 Unsteadiness on feet: Secondary | ICD-10-CM | POA: Diagnosis not present

## 2021-12-10 DIAGNOSIS — M9702XA Periprosthetic fracture around internal prosthetic left hip joint, initial encounter: Secondary | ICD-10-CM | POA: Diagnosis not present

## 2021-12-10 DIAGNOSIS — R2681 Unsteadiness on feet: Secondary | ICD-10-CM | POA: Diagnosis not present

## 2021-12-10 DIAGNOSIS — M6281 Muscle weakness (generalized): Secondary | ICD-10-CM | POA: Diagnosis not present

## 2021-12-10 DIAGNOSIS — R41841 Cognitive communication deficit: Secondary | ICD-10-CM | POA: Diagnosis not present

## 2021-12-10 DIAGNOSIS — M9702XD Periprosthetic fracture around internal prosthetic left hip joint, subsequent encounter: Secondary | ICD-10-CM | POA: Diagnosis not present

## 2021-12-10 DIAGNOSIS — Z9181 History of falling: Secondary | ICD-10-CM | POA: Diagnosis not present

## 2021-12-11 ENCOUNTER — Telehealth: Payer: Self-pay | Admitting: Adult Health

## 2021-12-11 ENCOUNTER — Other Ambulatory Visit: Payer: Self-pay

## 2021-12-11 ENCOUNTER — Inpatient Hospital Stay: Payer: Medicare Other

## 2021-12-11 ENCOUNTER — Other Ambulatory Visit: Payer: Self-pay | Admitting: Hematology

## 2021-12-11 ENCOUNTER — Inpatient Hospital Stay: Payer: Medicare Other | Attending: Hematology

## 2021-12-11 ENCOUNTER — Inpatient Hospital Stay (HOSPITAL_BASED_OUTPATIENT_CLINIC_OR_DEPARTMENT_OTHER): Payer: Medicare Other | Admitting: Adult Health

## 2021-12-11 ENCOUNTER — Encounter: Payer: Self-pay | Admitting: Adult Health

## 2021-12-11 VITALS — BP 97/44 | HR 64 | Temp 97.8°F | Resp 16

## 2021-12-11 DIAGNOSIS — Z9181 History of falling: Secondary | ICD-10-CM | POA: Diagnosis not present

## 2021-12-11 DIAGNOSIS — C184 Malignant neoplasm of transverse colon: Secondary | ICD-10-CM

## 2021-12-11 DIAGNOSIS — M9702XA Periprosthetic fracture around internal prosthetic left hip joint, initial encounter: Secondary | ICD-10-CM | POA: Diagnosis not present

## 2021-12-11 DIAGNOSIS — D500 Iron deficiency anemia secondary to blood loss (chronic): Secondary | ICD-10-CM

## 2021-12-11 DIAGNOSIS — Z5112 Encounter for antineoplastic immunotherapy: Secondary | ICD-10-CM | POA: Diagnosis not present

## 2021-12-11 DIAGNOSIS — Z79899 Other long term (current) drug therapy: Secondary | ICD-10-CM | POA: Insufficient documentation

## 2021-12-11 DIAGNOSIS — R41841 Cognitive communication deficit: Secondary | ICD-10-CM | POA: Diagnosis not present

## 2021-12-11 DIAGNOSIS — R2681 Unsteadiness on feet: Secondary | ICD-10-CM | POA: Diagnosis not present

## 2021-12-11 DIAGNOSIS — M6281 Muscle weakness (generalized): Secondary | ICD-10-CM | POA: Diagnosis not present

## 2021-12-11 DIAGNOSIS — M9702XD Periprosthetic fracture around internal prosthetic left hip joint, subsequent encounter: Secondary | ICD-10-CM | POA: Diagnosis not present

## 2021-12-11 LAB — CBC WITH DIFFERENTIAL (CANCER CENTER ONLY)
Abs Immature Granulocytes: 0.04 10*3/uL (ref 0.00–0.07)
Basophils Absolute: 0 10*3/uL (ref 0.0–0.1)
Basophils Relative: 0 %
Eosinophils Absolute: 0.3 10*3/uL (ref 0.0–0.5)
Eosinophils Relative: 8 %
HCT: 28.6 % — ABNORMAL LOW (ref 39.0–52.0)
Hemoglobin: 9.5 g/dL — ABNORMAL LOW (ref 13.0–17.0)
Immature Granulocytes: 1 %
Lymphocytes Relative: 10 %
Lymphs Abs: 0.4 10*3/uL — ABNORMAL LOW (ref 0.7–4.0)
MCH: 32.3 pg (ref 26.0–34.0)
MCHC: 33.2 g/dL (ref 30.0–36.0)
MCV: 97.3 fL (ref 80.0–100.0)
Monocytes Absolute: 0.4 10*3/uL (ref 0.1–1.0)
Monocytes Relative: 10 %
Neutro Abs: 2.8 10*3/uL (ref 1.7–7.7)
Neutrophils Relative %: 71 %
Platelet Count: 186 10*3/uL (ref 150–400)
RBC: 2.94 MIL/uL — ABNORMAL LOW (ref 4.22–5.81)
RDW: 19.9 % — ABNORMAL HIGH (ref 11.5–15.5)
WBC Count: 3.9 10*3/uL — ABNORMAL LOW (ref 4.0–10.5)
nRBC: 0 % (ref 0.0–0.2)

## 2021-12-11 LAB — CMP (CANCER CENTER ONLY)
ALT: 15 U/L (ref 0–44)
AST: 26 U/L (ref 15–41)
Albumin: 3.6 g/dL (ref 3.5–5.0)
Alkaline Phosphatase: 98 U/L (ref 38–126)
Anion gap: 6 (ref 5–15)
BUN: 37 mg/dL — ABNORMAL HIGH (ref 8–23)
CO2: 25 mmol/L (ref 22–32)
Calcium: 9.3 mg/dL (ref 8.9–10.3)
Chloride: 100 mmol/L (ref 98–111)
Creatinine: 1.66 mg/dL — ABNORMAL HIGH (ref 0.61–1.24)
GFR, Estimated: 38 mL/min — ABNORMAL LOW (ref >60)
Glucose, Bld: 123 mg/dL — ABNORMAL HIGH (ref 70–99)
Potassium: 4.6 mmol/L (ref 3.5–5.1)
Sodium: 131 mmol/L — ABNORMAL LOW (ref 135–145)
Total Bilirubin: 0.8 mg/dL (ref 0.3–1.2)
Total Protein: 7.2 g/dL (ref 6.5–8.1)

## 2021-12-11 LAB — FERRITIN: Ferritin: 204 ng/mL (ref 24–336)

## 2021-12-11 LAB — CEA (IN HOUSE-CHCC): CEA (CHCC-In House): 7.96 ng/mL — ABNORMAL HIGH (ref 0.00–5.00)

## 2021-12-11 LAB — TSH: TSH: 3.784 u[IU]/mL (ref 0.350–4.500)

## 2021-12-11 MED ORDER — SODIUM CHLORIDE 0.9 % IV SOLN
200.0000 mg | Freq: Once | INTRAVENOUS | Status: AC
  Administered 2021-12-11: 200 mg via INTRAVENOUS
  Filled 2021-12-11: qty 200

## 2021-12-11 MED ORDER — SODIUM CHLORIDE 0.9 % IV SOLN: Freq: Once | INTRAVENOUS | Status: AC

## 2021-12-11 MED ORDER — SODIUM CHLORIDE 0.9 % IV SOLN: INTRAVENOUS | Status: DC

## 2021-12-11 NOTE — Assessment & Plan Note (Signed)
Richard Spears is a 88 year old man with stage IIIa colon cancer currently receiving treatment with Keytruda.  Today is cycle 4.  He is tolerating treatment moderately well however he is experiencing some hypotension.  While this is a rare side effect of the pembrolizumab it is more likely his antihypertensives that are causing this as his kidney function is slightly elevated.  In the note I sent to the nursing home I recommended that they check his blood pressure prior to medication administration and contact his PCP for blood pressures less than 120/80 prior to administering these medications.  He will also get 500 mL of normal saline today.  He has not had restaging since beginning the [facility] so we will repeat CT abdomen and pelvis and he will return in 4 weeks for labs, follow-up with Dr. Morey Hummingbird, and his next infusion.

## 2021-12-11 NOTE — Patient Instructions (Signed)
[facility] MEDICAL ONCOLOGY   Discharge Instructions: Thank you for choosing [facility] to provide your oncology and hematology care.   If you have a lab appointment with the Cancer Center, please go directly to the Cancer Center and check in at the registration area.   Wear comfortable clothing and clothing appropriate for easy access to any Portacath or PICC line.   We strive to give you quality time with your provider. You may need to reschedule your appointment if you arrive late (15 or more minutes).  Arriving late affects you and other patients whose appointments are after yours.  Also, if you miss three or more appointments without notifying the office, you may be dismissed from the clinic at the provider's discretion.      For prescription refill requests, have your pharmacy contact our office and allow 72 hours for refills to be completed.    Today you received the following chemotherapy and/or immunotherapy agents: pembrolizumab      To help prevent nausea and vomiting after your treatment, we encourage you to take your nausea medication as directed.  BELOW ARE SYMPTOMS THAT SHOULD BE REPORTED IMMEDIATELY: *FEVER GREATER THAN 100.4 F (38 C) OR HIGHER *CHILLS OR SWEATING *NAUSEA AND VOMITING THAT IS NOT CONTROLLED WITH YOUR NAUSEA MEDICATION *UNUSUAL SHORTNESS OF BREATH *UNUSUAL BRUISING OR BLEEDING *URINARY PROBLEMS (pain or burning when urinating, or frequent urination) *BOWEL PROBLEMS (unusual diarrhea, constipation, pain near the anus) TENDERNESS IN MOUTH AND THROAT WITH OR WITHOUT PRESENCE OF ULCERS (sore throat, sores in mouth, or a toothache) UNUSUAL RASH, SWELLING OR PAIN  UNUSUAL VAGINAL DISCHARGE OR ITCHING   Items with * indicate a potential emergency and should be followed up as soon as possible or go to the Emergency Department if any problems should occur.  Please show the CHEMOTHERAPY ALERT CARD or IMMUNOTHERAPY ALERT CARD at  check-in to the Emergency Department and triage nurse.  Should you have questions after your visit or need to cancel or reschedule your appointment, please contact [facility]ICAL [facility]COL[facility]  Dept: [phone]  and follow the prompts.  Office hours are 8:00 a.m. to 4:30 p.m. Monday - Friday. Please note that voicemails left after 4:00 p.m. may not be returned until the following business day.  We are closed weekends and major holidays. You have access to a nurse at all times for urgent questions. Please call the main number to the clinic Dept: [phone] and follow the prompts.   For any non-urgent questions, you may also contact your provider using MyChart. We now offer e-Visits for anyone 18 and older to request care online for non-urgent symptoms. For details visit [email or web address].   Also download the MyChart app! Go to the app store, search "MyChart", open the app, select [facility], and log in with your MyChart username and password.  Masks are optional in the cancer centers. If you would like for your care team to wear a mask while they are taking care of you, please let them know. For doctor visits, patients may have with them one support person who is at least 86 years old. At this time, visitors are not allowed in the infusion area. 

## 2021-12-11 NOTE — Telephone Encounter (Signed)
Scheduled appointment per 6/16 los. Left message. Patient will be mailed an updated calendar.

## 2021-12-11 NOTE — Progress Notes (Signed)
[facility]:    Richard Orn, MD [address] [place] [address]   DIAGNOSIS:  Cancer Staging  Cancer of transverse colon  Staging form: Colon and Rectum, AJCC 8th Edition - Pathologic stage from 06/30/2021: Stage IIIA (pT2, pN1c, cM0) - Signed by [name], MD on 10/07/2021 Stage prefix: Initial diagnosis Total positive nodes: 0 Residual tumor (R): R0 - None   SUMMARY OF ONCOLOGIC HISTORY: Oncology History Overview Note   Cancer Staging  Cancer of transverse colon  Staging form: Colon and Rectum, AJCC 8th Edition - Pathologic stage from 06/30/2021: Stage I (pT2, pN0, cM0) - Signed by [name], MD on 07/03/2021    Cancer of transverse colon   06/24/2021 Imaging   EXAM: CTA ABDOMEN AND PELVIS WITHOUT AND WITH CONTRAST  MPRESSION: 1. Avid arterial phase enhancing masslike filling defect within the mid transverse colon is identified. Although conceivably this could represent a large blood clot the diagnosis of exclusion is a primary colonic neoplasm. Further evaluation with colonoscopy is recommended. 2. Extensive atherosclerotic disease noted with stenosis noted at the origin of the celiac artery, SMA, IMA and both renal arteries. 3. 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 4. Large left inguinal hernia contains a nonobstructed loop of large bowel. 5. Aortic Atherosclerosis (ICD10-I70.0).   06/27/2021 Procedure   Colonoscopy, Dr. Watt Climes  Impression: - Likely malignant partially obstructing tumor in the mid transverse colon. Biopsied. - The examination was otherwise normal on direct and retroflexion views. Unable to advance the scope past the tumor   06/27/2021 Initial Biopsy   FINAL MICROSCOPIC DIAGNOSIS:   A. TRANSVERSE COLON, BIOPSY:  - Poorly differentiated adenocarcinoma, see comment    ADDENDUM:  Mismatch Repair Protein (IHC)  SUMMARY INTERPRETATION: ABNORMAL   IHC EXPRESSION  RESULTS   TEST           RESULT  MLH1:          LOSS OF NUCLEAR EXPRESSION  MSH2:          Preserved nuclear expression  MSH6:          Preserved nuclear expression  PMS2:          LOSS OF NUCLEAR EXPRESSION    06/30/2021 Cancer Staging   Staging form: Colon and Rectum, AJCC 8th Edition - Pathologic stage from 06/30/2021: Stage IIIA (pT2, pN1c, cM0) - Signed by [name], MD on 10/07/2021 Stage prefix: Initial diagnosis Total positive nodes: 0 Residual tumor (R): R0 - None   06/30/2021 Definitive Surgery   FINAL MICROSCOPIC DIAGNOSIS:   A. COLON, TRANSVERSE, PARTIAL COLECTOMY:  Poorly differentiated adenocarcinoma invading muscularis propria.  See oncology table for details.    07/02/2021 Imaging   EXAM: CT CHEST WITHOUT CONTRAST  IMPRESSION: 1. No evidence of metastatic disease in the thorax. 2. Trace bilateral pleural effusions and associated atelectasis. 3. Multi chamber cardiomegaly. Coronary artery calcifications. Prominence of the central pulmonary artery suggesting pulmonary arterial hypertension.   07/03/2021 Initial Diagnosis   Cancer of transverse colon ([place])   10/07/2021 -  Chemotherapy   Patient is on Treatment Plan : COLORECTAL Pembrolizumab (200) q21d     10/16/2021 PET scan   IMPRESSION: 1. Intense multifocal hypermetabolic nodal metastases within the gastrohepatic ligament and upper retroperitoneum. This nodal disease appears mildly progressive compared with CT of 4 weeks ago. 2. No definite hypermetabolic activity at the colonic anastomosis as suggested on previous CT. 3. No hepatic or other definite distant metastases  identified. Low-level hilar activity bilaterally is nonspecific, but likely reactive. 4. Recent compression deformities at T10 and T11 with mild hypermetabolic activity.     CURRENT THERAPY: Keytruda  INTERVAL HISTORY: Richard Spears 88 y.o. male returns for follow up of his colon cancer, he is s/p a recent fall on May 18 where he had a  partial fracture of his left femur.  He came into the hospital and was evaluated by orthopedic surgery.  The fracture was inoperable and he was instructed to have 50% weightbearing on the leg and he will follow-up in a couple weeks.  He is unsure how he fell.  He is working with physical therapy on his mobility.  Richard Spears notes that he has a decreased appetite and is not drinking enough fluids.  He also notes that he is losing weight.  He did not want to stand on the scale for [place] because of his weightbearing status and being in the wheelchair it would have been technically difficult for him to get up there.  He is on several different blood pressure medications.  He denies any dizziness with position changes.   Patient Active Problem List   Diagnosis Date Noted   H/O colon cancer, stage III 11/17/2021   Left elbow pain 11/15/2021   Protein-calorie malnutrition, severe ([place]) 11/13/2021   Decubitus ulcer 11/13/2021   Hematoma of left hip    Closed fracture of proximal end of left femur, initial encounter ([place]) 11/12/2021   Prolonged QT interval 11/12/2021   Traumatic hematoma of buttock 11/12/2021   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, initial encounter 11/12/2021   Superficial vein thrombosis 07/15/2021   Cancer of transverse colon  07/03/2021   Acute blood loss anemia 02/03/2021   Hematoma 12/23/2020   Acute kidney injury superimposed on chronic kidney disease ([place]) 12/23/2020   Pressure ulcer of right buttock, stage 2 ([place]) 10/07/2020   Weight loss 09/23/2020   Insomnia 09/23/2020   Dysuria 09/23/2020   BPH (benign prostatic hyperplasia) 09/03/2020   Generalized osteoarthritis of multiple sites 09/03/2020   Slow transit constipation 08/30/2020   AKI (acute kidney injury) ([place]) 08/27/2020   Closed displaced fracture of distal epiphysis of left femur ([place]) 08/25/2020   Anemia 08/25/2020   Fall 08/25/2020   Alcohol use 08/25/2020   Valvular cardiomyopathy  ([place]) 01/21/2018   Hip fracture ([place]) 10/09/2017   Essential hypertension 05/12/2017   Exertional dyspnea 01/18/2016   Venous insufficiency 02/14/2015   Cancer of skin of right lower leg 10/31/2014   AI (aortic insufficiency) 01/23/2014   Longstanding persistent atrial fibrillation: CHA2DS2-VASc Score 3 01/17/2014   Hyperlipidemia    H/O mitral valve replacement with mechanical valve 04/08/1996    is allergic to flexeril [cyclobenzaprine].  MEDICAL HISTORY: Past Medical History:  Diagnosis Date   Anemia    leakoppenia   BPH (benign prostatic hypertrophy)    Bullous pemphigoid    [name], March 2011, right forearm squamous cell carcinoma   Chronic anticoagulation    systemic   Colon polyp    transverse, 2002   History of peptic ulcer    remote, 3/95   Hx of actinic keratosis    Hx of basal cell carcinoma    Hx of squamous cell carcinoma of skin    Hyperlipidemia    Left inguinal hernia    Moderate aortic insufficiency 2009   audible aortic insufficiency on 1/09 echo   PAF (paroxysmal atrial fibrillation) ([place]) 01/17/2014   On Warfarin.   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 Jude mechanical valve   ORIF FEMUR FRACTURE Left 08/28/2020   Procedure: OPEN REDUCTION INTERNAL FIXATION  (ORIF) DISTAL FEMUR FRACTURE;  Surgeon: Rod Can, MD;  Location: [facility];  Service: Orthopedics;  Laterality: Left;   PARTIAL COLECTOMY N/A 06/30/2021   Procedure: PARTIAL COLECTOMY;  Surgeon: Clovis Riley, MD;  Location: [facility];  Service: General;  Laterality: N/A;   TOTAL HIP ARTHROPLASTY Right 10/12/2017   Procedure: RIGHT TOTAL HIP ARTHROPLASTY ANTERIOR APPROACH;  Surgeon: Gaynelle Arabian, MD;  Location: [facility];  Service: Orthopedics;  Laterality: Right;   TRANSTHORACIC ECHOCARDIOGRAM  12/2018   Unable to assess diastolic function because of A. fib. Normal RV function, but moderately elevated RVSP.  Severe biatrial enlargement. S/P St Jude bileaflet mechanical MVR that appears to be functioning normally. Mitral valve regurgitation cannot assess due to mechanical valve shadowing. MV Mean grad: 7.0 mmHg MV Area (PHT): 3.38 cm (stable for valve).  Mild Ao Sclerosis, Mild-Mod AI   TRANSTHORACIC ECHOCARDIOGRAM  08/'17; 10/'18    a) Mild conc LVH. EF 55-60%. No RWMA. Mod AI. Mechanical MV prosthesis functioning properly. LAD dilation.;; b)  EF 55-60%.  Mo AI.  Bileaflet Saint Jude mechanical MV with no paravalvular leak.  Severe LA dilation.  Minimally elevated PAP    SOCIAL HISTORY: Social History   Socioeconomic History   Marital status: Married    Spouse name: Not on file   Number of children: 2   Years of education: Not on file   Highest education level: Not on file  Occupational History   Occupation: retired  Tobacco Use   Smoking status: Former    Packs/day: 1.00    Years: 13.00    Total pack years: 13.00    Types: Cigarettes    Start date: 49    Quit date: 1960    Years since quitting: 63.4   Smokeless tobacco: Never  Vaping Use   Vaping Use: Never used  Substance and Sexual Activity   Alcohol use: Yes    Alcohol/week: 0.0 standard drinks of alcohol    Comment: 1-2 drinks per day   Drug use: Never   Sexual activity: Not on file  Other Topics Concern   Not on file   Social History Narrative   Patient lives at [facility], With his wife - Meryl Crutch.   Social Determinants of Health   Financial Resource Strain: Not on file  Food Insecurity: Not on file  Transportation Needs: Not on file  Physical Activity: Not on file  Stress: Not on file  Social Connections: Not on file  Intimate Partner Violence: Not on file    FAMILY HISTORY: Family History  Problem Relation Age of Onset   Hypertension Mother  Lung cancer Sister    COPD Brother    Cancer Brother    Other Sister        polio   Lupus Son     Review of Systems  Constitutional:  Positive for appetite change and fatigue. Negative for chills, fever and unexpected weight change.  HENT:   Negative for hearing loss, lump/mass and trouble swallowing.   Eyes:  Negative for eye problems and icterus.  Respiratory:  Negative for chest tightness, cough and shortness of breath.   Cardiovascular:  Negative for chest pain, leg swelling and palpitations.  Gastrointestinal:  Negative for abdominal distention, abdominal pain, constipation, diarrhea, nausea and vomiting.  Endocrine: Negative for hot flashes.  Genitourinary:  Negative for difficulty urinating.   Musculoskeletal:  Negative for arthralgias.  Skin:  Negative for itching and rash.  Neurological:  Negative for dizziness, extremity weakness, headaches and numbness.  Hematological:  Negative for adenopathy. Does not bruise/bleed easily.  Psychiatric/Behavioral:  Negative for depression. The patient is not nervous/anxious.       PHYSICAL EXAMINATION  ECOG PERFORMANCE STATUS: 3 - Symptomatic, >50% confined to bed  Vitals:   12/11/21 1348  BP: (!) 97/44  Pulse: 64  Resp: 16  Temp: 97.8 F (36.6 C)  SpO2: 100%    Physical Exam Constitutional:      General: He is not in acute distress.    Appearance: Normal appearance. He is not toxic-appearing.     Comments: Cachectic chronically ill older man examined in wheelchair  HENT:      Head: Normocephalic and atraumatic.  Eyes:     General: No scleral icterus. Cardiovascular:     Rate and Rhythm: Normal rate. Rhythm irregular.     Pulses: Normal pulses.     Heart sounds: Normal heart sounds.  Pulmonary:     Effort: Pulmonary effort is normal.     Breath sounds: Normal breath sounds.  Abdominal:     General: Abdomen is flat. Bowel sounds are normal. There is no distension.     Palpations: Abdomen is soft.     Tenderness: There is no abdominal tenderness.  Musculoskeletal:        General: No swelling.     Cervical back: Neck supple.  Lymphadenopathy:     Cervical: No cervical adenopathy.  Skin:    General: Skin is warm and dry.     Findings: No rash.     Comments: Dry scaling hyperpigmented skin noted on lower legs.  Neurological:     General: No focal deficit present.     Mental Status: He is alert.  Psychiatric:        Mood and Affect: Mood normal.        Behavior: Behavior normal.     LABORATORY DATA:  CBC    Component Value Date/Time   WBC 3.9 (L) 12/11/2021 1321   WBC 3.5 (L) 11/17/2021 0311   RBC 2.94 (L) 12/11/2021 1321   HGB 9.5 (L) 12/11/2021 1321   HGB 9.2 10/20/2017 0000   HCT 28.6 (L) 12/11/2021 1321   HCT 26.3 10/20/2017 0000   PLT 186 12/11/2021 1321   MCV 97.3 12/11/2021 1321   MCH 32.3 12/11/2021 1321   MCHC 33.2 12/11/2021 1321   RDW 19.9 (H) 12/11/2021 1321   LYMPHSABS 0.4 (L) 12/11/2021 1321   MONOABS 0.4 12/11/2021 1321   EOSABS 0.3 12/11/2021 1321   BASOSABS 0.0 12/11/2021 1321    CMP     Component Value Date/Time   NA  131 (L) 12/11/2021 1321   NA 134 (A) 10/23/2020 1035   NA 130 10/20/2017 0000   K 4.6 12/11/2021 1321   K 4.2 10/20/2017 0000   CL 100 12/11/2021 1321   CO2 25 12/11/2021 1321   GLUCOSE 123 (H) 12/11/2021 1321   BUN 37 (H) 12/11/2021 1321   BUN 33 (A) 10/23/2020 1035   CREATININE 1.66 (H) 12/11/2021 1321   CREATININE 1.38 10/20/2017 0000   CALCIUM 9.3 12/11/2021 1321   CALCIUM 8.6 10/20/2017  0000   PROT 7.2 12/11/2021 1321   ALBUMIN 3.6 12/11/2021 1321   AST 26 12/11/2021 1321   ALT 15 12/11/2021 1321   ALKPHOS 98 12/11/2021 1321   BILITOT 0.8 12/11/2021 1321   GFRNONAA 38 (L) 12/11/2021 1321   GFRAA 61 10/23/2020 1035     ASSESSMENT and THERAPY PLAN:   Cancer of transverse colon  Richard Spears is a 88 year old man with stage IIIa colon cancer currently receiving treatment with Keytruda.  Today is cycle 4.  He is tolerating treatment moderately well however he is experiencing some hypotension.  While this is a rare side effect of the pembrolizumab it is more likely his antihypertensives that are causing this as his kidney function is slightly elevated.  In the note I sent to the nursing home I recommended that they check his blood pressure prior to medication administration and contact his PCP for blood pressures less than 120/80 prior to administering these medications.  He will also get 500 mL of normal saline today.  He has not had restaging since beginning the [facility] so we will repeat CT abdomen and pelvis and he will return in 4 weeks for labs, follow-up with Dr. Morey Hummingbird, and his next infusion.  I called Dr. Burr Medico and discussed the above with her in detail she helped in formulating the above plan and is in agreement with it.  All questions were answered. The patient knows to call the clinic with any problems, questions or concerns. We can certainly see the patient much sooner if necessary.  Total encounter time:30 minutes*in face-to-face visit time, chart review, lab review, care coordination, order entry, and documentation of the encounter time.    Wilber Bihari, NP 12/11/21 2:41 PM Medical Oncology and Hematology [facility] [place],  [address] Tel. [phone]    Fax. [phone]  *Total Encounter Time as defined by the Centers for Medicare and Medicaid Services includes, in addition to the face-to-face time of a patient  visit (documented in the note above) non-face-to-face time: obtaining and reviewing outside history, ordering and reviewing medications, tests or procedures, care coordination (communications with other health care professionals or caregivers) and documentation in the medical record.

## 2021-12-12 LAB — T4: T4, Total: 4.9 ug/dL (ref 4.5–12.0)

## 2021-12-14 DIAGNOSIS — Z9181 History of falling: Secondary | ICD-10-CM | POA: Diagnosis not present

## 2021-12-14 DIAGNOSIS — R2681 Unsteadiness on feet: Secondary | ICD-10-CM | POA: Diagnosis not present

## 2021-12-14 DIAGNOSIS — R41841 Cognitive communication deficit: Secondary | ICD-10-CM | POA: Diagnosis not present

## 2021-12-14 DIAGNOSIS — M6281 Muscle weakness (generalized): Secondary | ICD-10-CM | POA: Diagnosis not present

## 2021-12-14 DIAGNOSIS — M9702XD Periprosthetic fracture around internal prosthetic left hip joint, subsequent encounter: Secondary | ICD-10-CM | POA: Diagnosis not present

## 2021-12-14 DIAGNOSIS — M9702XA Periprosthetic fracture around internal prosthetic left hip joint, initial encounter: Secondary | ICD-10-CM | POA: Diagnosis not present

## 2021-12-15 ENCOUNTER — Other Ambulatory Visit (HOSPITAL_COMMUNITY): Payer: Self-pay

## 2021-12-15 ENCOUNTER — Ambulatory Visit: Payer: Medicare Other | Admitting: Hematology

## 2021-12-15 ENCOUNTER — Telehealth: Payer: Self-pay

## 2021-12-15 ENCOUNTER — Other Ambulatory Visit: Payer: Medicare Other

## 2021-12-15 DIAGNOSIS — M6281 Muscle weakness (generalized): Secondary | ICD-10-CM | POA: Diagnosis not present

## 2021-12-15 DIAGNOSIS — R2681 Unsteadiness on feet: Secondary | ICD-10-CM | POA: Diagnosis not present

## 2021-12-15 DIAGNOSIS — M9702XD Periprosthetic fracture around internal prosthetic left hip joint, subsequent encounter: Secondary | ICD-10-CM | POA: Diagnosis not present

## 2021-12-15 DIAGNOSIS — M9702XA Periprosthetic fracture around internal prosthetic left hip joint, initial encounter: Secondary | ICD-10-CM | POA: Diagnosis not present

## 2021-12-15 DIAGNOSIS — R41841 Cognitive communication deficit: Secondary | ICD-10-CM | POA: Diagnosis not present

## 2021-12-15 DIAGNOSIS — Z9181 History of falling: Secondary | ICD-10-CM | POA: Diagnosis not present

## 2021-12-15 NOTE — Telephone Encounter (Signed)
Lpmtcb and schedule INR check 

## 2021-12-16 ENCOUNTER — Encounter: Payer: Self-pay | Admitting: Orthopedic Surgery

## 2021-12-16 ENCOUNTER — Non-Acute Institutional Stay (SKILLED_NURSING_FACILITY): Payer: Medicare Other | Admitting: Orthopedic Surgery

## 2021-12-16 ENCOUNTER — Other Ambulatory Visit: Payer: Self-pay | Admitting: Orthopedic Surgery

## 2021-12-16 DIAGNOSIS — E871 Hypo-osmolality and hyponatremia: Secondary | ICD-10-CM

## 2021-12-16 DIAGNOSIS — I10 Essential (primary) hypertension: Secondary | ICD-10-CM | POA: Diagnosis not present

## 2021-12-16 DIAGNOSIS — S7002XD Contusion of left hip, subsequent encounter: Secondary | ICD-10-CM

## 2021-12-16 DIAGNOSIS — I4811 Longstanding persistent atrial fibrillation: Secondary | ICD-10-CM | POA: Diagnosis not present

## 2021-12-16 DIAGNOSIS — C184 Malignant neoplasm of transverse colon: Secondary | ICD-10-CM | POA: Diagnosis not present

## 2021-12-16 DIAGNOSIS — N1832 Chronic kidney disease, stage 3b: Secondary | ICD-10-CM

## 2021-12-16 DIAGNOSIS — D62 Acute posthemorrhagic anemia: Secondary | ICD-10-CM

## 2021-12-16 DIAGNOSIS — R7989 Other specified abnormal findings of blood chemistry: Secondary | ICD-10-CM | POA: Diagnosis not present

## 2021-12-16 DIAGNOSIS — R2681 Unsteadiness on feet: Secondary | ICD-10-CM | POA: Diagnosis not present

## 2021-12-16 DIAGNOSIS — M9702XD Periprosthetic fracture around internal prosthetic left hip joint, subsequent encounter: Secondary | ICD-10-CM | POA: Diagnosis not present

## 2021-12-16 DIAGNOSIS — E43 Unspecified severe protein-calorie malnutrition: Secondary | ICD-10-CM

## 2021-12-16 DIAGNOSIS — M9702XA Periprosthetic fracture around internal prosthetic left hip joint, initial encounter: Secondary | ICD-10-CM | POA: Diagnosis not present

## 2021-12-16 DIAGNOSIS — M979XXD Periprosthetic fracture around unspecified internal prosthetic joint, subsequent encounter: Secondary | ICD-10-CM | POA: Diagnosis not present

## 2021-12-16 DIAGNOSIS — Z952 Presence of prosthetic heart valve: Secondary | ICD-10-CM

## 2021-12-16 DIAGNOSIS — F419 Anxiety disorder, unspecified: Secondary | ICD-10-CM

## 2021-12-16 DIAGNOSIS — Z9181 History of falling: Secondary | ICD-10-CM | POA: Diagnosis not present

## 2021-12-16 DIAGNOSIS — N400 Enlarged prostate without lower urinary tract symptoms: Secondary | ICD-10-CM | POA: Diagnosis not present

## 2021-12-16 DIAGNOSIS — M6281 Muscle weakness (generalized): Secondary | ICD-10-CM | POA: Diagnosis not present

## 2021-12-16 DIAGNOSIS — R41841 Cognitive communication deficit: Secondary | ICD-10-CM | POA: Diagnosis not present

## 2021-12-16 MED ORDER — LORAZEPAM 1 MG PO TABS: 0.5000 mg | ORAL_TABLET | Freq: Every day | ORAL | 0 refills | Status: DC | PRN

## 2021-12-16 MED ORDER — LORAZEPAM 0.5 MG PO TABS: 0.5000 mg | ORAL_TABLET | Freq: Every day | ORAL | 0 refills | Status: DC | PRN

## 2021-12-16 NOTE — Progress Notes (Signed)
Location:   [place] Room Number: 36 Place of Service:  SNF ([phone]) Provider:  Yvonna Alanis, NP    [name], MD  Patient Care Team: [name], MD as PCP - General (Internal Medicine) Leonie Man, MD as PCP - Cardiology (Cardiology) [name], MD as Consulting Physician (Hematology)  Extended Emergency Contact Information Primary Emergency Contact: [facility] Address: [address]          [place], [place] [address] Johnnette Litter of [place] Phone: [phone] Relation: Spouse Secondary Emergency Contact: Walkowski,Newell  [place] Phone: [phone] Mobile Phone: [phone] Relation: Son  Code Status:  DNR Goals of care: Advanced Directive information    12/16/2021    9:45 AM  Advanced Directives  Does Patient Have a Medical Advance Directive? Yes  Type of Advance Directive Out of facility DNR (pink MOST or yellow form);Living will  Does patient want to make changes to medical advance directive? No - Patient declined  Pre-existing out of facility DNR order (yellow form or pink MOST form) Yellow form placed in chart (order not valid for inpatient use);Pink MOST form placed in chart (order not valid for inpatient use)     Chief Complaint  Patient presents with   Medical Management of Chronic Issues   Quality Metric Gaps    Verified Matrix and NCIR patient is due for: Zoster Vaccines- Shingrix (1 of 2)         HPI:  Pt is a 88 y.o. male seen today for medical management of chronic diseases.    He currently resides on the skilled nursing unit at [facility]. PMH: HTN, afib, valvular cardiomyopathy, mitral valve replacement, transverse colon cancer, constipation, hip fracture, CKD, BPH, blood loss anemia, malnutrition.   Left femur fracture- mechanical fall 05/18, CT left hip revealed periprosthetic left femur fracture, non surgical, PWB 50% with no abduction x 6 weeks, denies  pain, doing well with PT Left hip hematoma- denies pain, almost resolved, remains on coumadin Blood loss anemia- 1 unit PRBC last hospitalization for hgb 7.2, hgb 9.5 12/11/2021, reports having loose stools that are dark/tarry, remains on ferrous sulfate  Colon Cancer, stage III- followed by Dr. Burr Medico and palliative, involves transverse colon, CEA 7.96 (06/16)> was 7.03 (05/04), continues to receive Beryle Flock (last treatment 06/16- #4) , Plan to repeat CT abdomen 12/2021 Afib- HR controlled with bisoprolol, remains on coumadin for clot prevention S/p MVR- bileaflet valve replacement with St. Jude, goal INR 2.5-3.5, remains on coumadin HTN- BUN/creat 37/1.66 12/11/2021, remains on bisoprolol, furosemide, and hydralazine CKD 3a- see above Hyponatremia- Na+ 131 12/11/2021 BPH- denies urinary issues since strength has improved Elevated TSH- TSH 3.784 12/11/2021, was 14.552 10/29/2021 Protein-calorie malnutrition- see weight trends below, remains on Boost   No recent falls or injuries. Ambulating with walker- 100-200 ft.   Still plans to move back to [place].   Wife moved to SNF due to declining health, hospice initiated. He has good family support from son.   Recent blood pressures:  06/20- 115/54  06/13- 107/55  06/06- 123/65  Recent weights:  06/01- 136.6 lbs  05/23- 134.8 lbs       Past Medical History:  Diagnosis Date   Anemia    leakoppenia   BPH (benign prostatic hypertrophy)    Bullous pemphigoid    [name], March 2011, right forearm squamous cell carcinoma   Chronic anticoagulation    systemic   Colon polyp  transverse, 2002   History of peptic ulcer    remote, 3/95   Hx of actinic keratosis    Hx of basal cell carcinoma    Hx of squamous cell carcinoma of skin    Hyperlipidemia    Left inguinal hernia    Moderate aortic insufficiency 2009   audible aortic insufficiency on 1/09 echo   PAF (paroxysmal atrial fibrillation) ([place]) 01/17/2014   On Warfarin.   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 Jude mechanical valve   ORIF FEMUR FRACTURE Left 08/28/2020   Procedure: OPEN REDUCTION INTERNAL FIXATION (ORIF) DISTAL FEMUR FRACTURE;  Surgeon: Rod Can, MD;  Location: [facility];  Service: Orthopedics;  Laterality: Left;   PARTIAL COLECTOMY N/A 06/30/2021   Procedure: PARTIAL COLECTOMY;  Surgeon: Clovis Riley, MD;  Location: [facility];  Service: General;  Laterality: N/A;   TOTAL HIP ARTHROPLASTY Right 10/12/2017   Procedure: RIGHT TOTAL HIP ARTHROPLASTY ANTERIOR APPROACH;  Surgeon: Gaynelle Arabian, MD;  Location: [facility];  Service: Orthopedics;  Laterality: Right;   TRANSTHORACIC ECHOCARDIOGRAM  12/2018   Unable to assess diastolic function because of A. fib. Normal RV function, but moderately elevated RVSP.  Severe biatrial enlargement. S/P St Jude bileaflet mechanical MVR that appears to be functioning normally. Mitral valve regurgitation cannot assess due to mechanical valve shadowing. MV Mean grad: 7.0 mmHg MV Area (PHT): 3.38 cm (stable for valve).  Mild Ao Sclerosis, Mild-Mod AI   TRANSTHORACIC ECHOCARDIOGRAM  08/'17; 10/'18    a) Mild conc LVH. EF 55-60%. No RWMA. Mod AI. Mechanical MV prosthesis functioning properly. LAD dilation.;; b)  EF 55-60%.  Mo AI.  Bileaflet Saint Jude  mechanical MV with no paravalvular leak.  Severe LA dilation.  Minimally elevated PAP    Allergies  Allergen Reactions   Flexeril [Cyclobenzaprine] Diarrhea    Allergies as of 12/16/2021       Reactions   Flexeril [cyclobenzaprine] Diarrhea        Medication List        Accurate as of December 16, 2021  9:45 AM. If you have any questions, ask your nurse or doctor.          acetaminophen 500 MG tablet Commonly known as: TYLENOL Take 1,000 mg by mouth 3 (three) times daily as needed for moderate pain.   bisoprolol 5 MG tablet Commonly known as: ZEBETA Take 1 tablet (5 mg total) by mouth daily.   ferrous sulfate 325 (65 FE) MG tablet Take 1 tablet (325 mg total) by mouth daily with breakfast.   fluticasone 50 MCG/ACT nasal spray Commonly known as: FLONASE Place 1 spray into both nostrils daily as needed for allergies or rhinitis.   furosemide 40 MG tablet Commonly known as: LASIX Take 20-40 mg by mouth  See admin instructions. Take 20 mg on Monday Wednesday and Friday and 40 mg on all other days.   hydrALAZINE 25 MG tablet Commonly known as: APRESOLINE Take 25 mg by mouth daily at lunch, may take an  extra tablet as needed for systolic BP greater than 829 mmHg daily   isosorbide mononitrate 30 MG 24 hr tablet Commonly known as: IMDUR Take 30 mg by mouth daily.   lactose free nutrition Liqd Take 237 mLs by mouth daily.   loperamide 2 MG capsule Commonly known as: IMODIUM Take 1 capsule (2 mg total) by mouth as needed for diarrhea or loose stools.   LORazepam 1 MG tablet Commonly known as: ATIVAN Take 0.5 tablets (0.5 mg total) by mouth daily as needed for anxiety.   MULTIVITAMIN PO Take 1 tablet by mouth daily.   oxyCODONE 5 MG immediate release tablet Commonly known as: Oxy IR/ROXICODONE Take 2.5 mg by mouth daily as needed for severe pain.   oxyCODONE 5 MG immediate release tablet Commonly known as: Oxy IR/ROXICODONE Take 5 mg by mouth every 8 (eight)  hours as needed for severe pain.   polyethylene glycol 17 g packet Commonly known as: MIRALAX / GLYCOLAX Take 17 g by mouth daily as needed for mild constipation.   PreserVision AREDS 2 Caps Take 1 capsule by mouth daily.   senna 8.6 MG Tabs tablet Commonly known as: SENOKOT Take 2 tablets (17.2 mg total) by mouth at bedtime.   traMADol 50 MG tablet Commonly known as: ULTRAM Take 50 mg by mouth every 8 (eight) hours as needed.   warfarin 2.5 MG tablet Commonly known as: COUMADIN Take as directed by the anticoagulation clinic. If you are unsure how to take this medication, talk to your nurse or doctor. Original instructions: Take 2.5 mg by mouth. Once A Day on Sun, Tue, Thu, Sat   warfarin 5 MG tablet Commonly known as: COUMADIN Take as directed by the anticoagulation clinic. If you are unsure how to take this medication, talk to your nurse or doctor. Original instructions: Take 5 mg by mouth. Once A Day on Mon, Wed, Fri   zinc oxide 20 % ointment Apply 1 application. topically as needed for irritation.        Review of Systems  Constitutional:  Positive for appetite change. Negative for activity change, chills, fatigue and fever.  HENT:  Negative for congestion and trouble swallowing.   Eyes:  Negative for visual disturbance.  Respiratory:  Negative for cough, shortness of breath and wheezing.   Cardiovascular:  Negative for chest pain and leg swelling.  Gastrointestinal:  Positive for constipation and diarrhea. Negative for abdominal distention, abdominal pain, nausea and vomiting.  Genitourinary:  Negative for dysuria, frequency and hematuria.  Musculoskeletal:  Positive for arthralgias and gait problem.  Skin:  Positive for wound.  Neurological:  Positive for weakness. Negative for dizziness and headaches.  Psychiatric/Behavioral:  Negative for confusion, dysphoric mood and sleep disturbance. The patient is not nervous/anxious.     Immunization History   Administered Date(s) Administered   Influenza Split 02/27/2012, 03/28/2013, 04/10/2014, 05/02/2015, 04/20/2016, 03/28/2017, 03/28/2018, 04/11/2019   Influenza, High Dose Seasonal PF 04/20/2016   Influenza,inj,Quad PF,6+ Mos 03/28/2018   Influenza-Unspecified 03/30/2017, 04/08/2020   Moderna SARS-COV2 Booster Vaccination 12/03/2020   Moderna Sars-Covid-2 Vaccination 07/02/2019, 07/30/2019, 05/12/2020   Pfizer Covid-19 Vaccine Bivalent Booster 52yr & up 04/15/2021   Pneumococcal Conjugate-13 06/10/2014   Pneumococcal Polysaccharide-23 02/02/2006   Pneumococcal-Unspecified 02/02/2006   Td 12/28/2002, 04/11/2014  Tdap 06/10/2014   Zoster, Live 03/29/2015, 11/28/2019, 01/28/2020   Zoster, Unspecified 05/02/2015   Pertinent  Health Maintenance Due  Topic Date Due   INFLUENZA VACCINE  01/26/2022      11/16/2021    1:00 AM 11/16/2021    8:20 AM 11/17/2021   12:00 AM 11/17/2021   10:15 AM 12/11/2021    1:00 PM  Fall Risk  Patient Fall Risk Level High fall risk High fall risk High fall risk High fall risk High fall risk   Functional Status Survey:    Vitals:   12/16/21 0926  BP: (!) 115/54  Pulse: 67  Resp: 18  Temp: 97.8 F (36.6 C)  SpO2: 96%  Weight: 136 lb 9.6 oz (62 kg)  Height: '5\' 11"'$  (1.803 m)   Body mass index is 19.05 kg/m. Physical Exam Vitals reviewed.  Constitutional:      General: He is not in acute distress.    Appearance: He is underweight.  HENT:     Head: Normocephalic.     Right Ear: There is no impacted cerumen.     Left Ear: There is no impacted cerumen.     Mouth/Throat:     Mouth: Mucous membranes are moist.  Eyes:     General:        Right eye: No discharge.        Left eye: No discharge.  Cardiovascular:     Rate and Rhythm: Normal rate. Rhythm irregular.     Pulses: Normal pulses.     Heart sounds: Murmur heard.  Pulmonary:     Effort: Pulmonary effort is normal. No respiratory distress.     Breath sounds: Normal breath sounds. No  wheezing.  Abdominal:     General: Bowel sounds are normal. There is no distension.     Palpations: Abdomen is soft.     Tenderness: There is no abdominal tenderness.  Musculoskeletal:     Cervical back: Neck supple.     Right lower leg: No edema.     Left lower leg: No edema.  Skin:    General: Skin is warm and dry.     Capillary Refill: Capillary refill takes less than 2 seconds.     Findings: Bruising present.     Comments: 2 cm abrasion to right knee, CDI, covered with steri strips. Left hip hematoma improving, smaller in size, skin intact. BLE mottled appearance. Overall skin with dry appearance.   Neurological:     General: No focal deficit present.     Mental Status: He is alert and oriented to person, place, and time.     Motor: Weakness present.     Gait: Gait abnormal.     Comments: Walker/wheelchair  Psychiatric:        Mood and Affect: Mood normal.        Behavior: Behavior normal.     Comments: Very pleasant, follows commands, alert to self/person/place/situation     Labs reviewed: Recent Labs    07/06/21 0156 07/06/21 1216 11/13/21 0347 11/14/21 0331 11/16/21 0333 11/17/21 0311 11/20/21 0000 12/11/21 1321  NA  --    < > 134*   < > 136 135 137 131*  K  --    < > 3.8   < > 4.6 4.9 4.3 4.6  CL  --    < > 103   < > 105 106 105 100  CO2  --    < > 26   < > 25 25 22  25  GLUCOSE  --    < > 97   < > 126* 97  --  123*  BUN  --    < > 43*   < > 29* 34* 35* 37*  CREATININE  --    < > 1.60*   < > 1.22 1.20 1.1 1.66*  CALCIUM  --    < > 8.2*   < > 8.6* 8.6* 8.3* 9.3  MG  --    < > 2.3  --  2.3 2.2  --   --   PHOS 3.0  --   --   --  2.7 2.7  --   --    < > = values in this interval not displayed.   Recent Labs    11/16/21 0333 11/17/21 0311 11/20/21 0000 12/11/21 1321  AST '23 26 29 26  '$ ALT '13 16 17 15  '$ ALKPHOS 43 44 51 98  BILITOT 0.9 0.8  --  0.8  PROT 5.7* 5.9*  --  7.2  ALBUMIN 2.7* 2.7* 3.1* 3.6   Recent Labs    11/16/21 0333 11/17/21 0311  11/20/21 0000 12/11/21 1321  WBC 3.0* 3.5* 3.5 3.9*  NEUTROABS 1.9 2.2  --  2.8  HGB 8.1* 8.2* 8.7* 9.5*  HCT 25.1* 26.3* 27* 28.6*  MCV 98.0 97.8  --  97.3  PLT 145* 154 190 186   Lab Results  Component Value Date   TSH 3.784 12/11/2021   No results found for: "HGBA1C" No results found for: "CHOL", "HDL", "LDLCALC", "LDLDIRECT", "TRIG", "CHOLHDL"  Significant Diagnostic Results in last 30 days:  No results found.  Assessment/Plan 1. Periprosthetic fracture around internal prosthetic joint, subsequent encounter - s/p ORIF left femur 08/2020 - mechanical fall 05/18 with increased left hip pain - CT left hip revealed periprosthetic left femur fracture - followed by Dr. Lyla Glassing - denies pain - PWB 50%, no abduction x 6 weeks - doing well with PT  2. Hematoma of left hip, subsequent encounter - improved  3. Acute blood loss anemia - hgb 9.5 06/16 - 1 unit PRBC 10/2021 - reports intermittent loose stools- black/tarry - cont ferrous sulfate  4. Transverse colon mass-s/p exploratory laparotomy-colectomy-pathology positive for adenocarcinoma - followed by Dr. Burr Medico and palliative - on Keytruda- last treatment 06/16- #4 - CT abdomen scheduled 12/2021  5. Longstanding persistent atrial fibrillation: CHA2DS2-VASc Score 3 - HR controlled with bisoprolol - cont coumadin for clot prevention  6. S/P MVR (mitral valve replacement) - goal INR 2.5-3.5  7. Essential hypertension - controlled  - cont bisoprolol, furosemide and hydralazine  8. Stage 3b chronic kidney disease ([place]) - avoid nephrotoxic drugs like NSAIDS and dose adjust medications to be renally excreted - encourage hydration with water   9. Hyponatremia - Na 131 12/11/2021  10. Benign prostatic hyperplasia without lower urinary tract symptoms - no issues since mobility has improved  11. Elevated TSH - TSH stable  12. Protein-calorie malnutrition, severe ([place]) - weight stable - BMI 19.05 - cont Boost -  cont monthly weights    Family/ staff Communication: plan discussed with patient and nurse  Labs/tests ordered: none

## 2021-12-17 DIAGNOSIS — M6281 Muscle weakness (generalized): Secondary | ICD-10-CM | POA: Diagnosis not present

## 2021-12-17 DIAGNOSIS — M9702XD Periprosthetic fracture around internal prosthetic left hip joint, subsequent encounter: Secondary | ICD-10-CM | POA: Diagnosis not present

## 2021-12-17 DIAGNOSIS — Z9181 History of falling: Secondary | ICD-10-CM | POA: Diagnosis not present

## 2021-12-17 DIAGNOSIS — M9702XA Periprosthetic fracture around internal prosthetic left hip joint, initial encounter: Secondary | ICD-10-CM | POA: Diagnosis not present

## 2021-12-17 DIAGNOSIS — R41841 Cognitive communication deficit: Secondary | ICD-10-CM | POA: Diagnosis not present

## 2021-12-17 DIAGNOSIS — R2681 Unsteadiness on feet: Secondary | ICD-10-CM | POA: Diagnosis not present

## 2021-12-18 DIAGNOSIS — Z9181 History of falling: Secondary | ICD-10-CM | POA: Diagnosis not present

## 2021-12-18 DIAGNOSIS — R41841 Cognitive communication deficit: Secondary | ICD-10-CM | POA: Diagnosis not present

## 2021-12-18 DIAGNOSIS — M9702XD Periprosthetic fracture around internal prosthetic left hip joint, subsequent encounter: Secondary | ICD-10-CM | POA: Diagnosis not present

## 2021-12-18 DIAGNOSIS — M9702XA Periprosthetic fracture around internal prosthetic left hip joint, initial encounter: Secondary | ICD-10-CM | POA: Diagnosis not present

## 2021-12-18 DIAGNOSIS — M6281 Muscle weakness (generalized): Secondary | ICD-10-CM | POA: Diagnosis not present

## 2021-12-18 DIAGNOSIS — R2681 Unsteadiness on feet: Secondary | ICD-10-CM | POA: Diagnosis not present

## 2021-12-21 DIAGNOSIS — M9702XD Periprosthetic fracture around internal prosthetic left hip joint, subsequent encounter: Secondary | ICD-10-CM | POA: Diagnosis not present

## 2021-12-21 DIAGNOSIS — M6281 Muscle weakness (generalized): Secondary | ICD-10-CM | POA: Diagnosis not present

## 2021-12-21 DIAGNOSIS — M9702XA Periprosthetic fracture around internal prosthetic left hip joint, initial encounter: Secondary | ICD-10-CM | POA: Diagnosis not present

## 2021-12-21 DIAGNOSIS — Z9181 History of falling: Secondary | ICD-10-CM | POA: Diagnosis not present

## 2021-12-21 DIAGNOSIS — R41841 Cognitive communication deficit: Secondary | ICD-10-CM | POA: Diagnosis not present

## 2021-12-21 DIAGNOSIS — R2681 Unsteadiness on feet: Secondary | ICD-10-CM | POA: Diagnosis not present

## 2021-12-22 DIAGNOSIS — R41841 Cognitive communication deficit: Secondary | ICD-10-CM | POA: Diagnosis not present

## 2021-12-22 DIAGNOSIS — Z9181 History of falling: Secondary | ICD-10-CM | POA: Diagnosis not present

## 2021-12-22 DIAGNOSIS — M9702XA Periprosthetic fracture around internal prosthetic left hip joint, initial encounter: Secondary | ICD-10-CM | POA: Diagnosis not present

## 2021-12-22 DIAGNOSIS — M9702XD Periprosthetic fracture around internal prosthetic left hip joint, subsequent encounter: Secondary | ICD-10-CM | POA: Diagnosis not present

## 2021-12-22 DIAGNOSIS — M6281 Muscle weakness (generalized): Secondary | ICD-10-CM | POA: Diagnosis not present

## 2021-12-22 DIAGNOSIS — R2681 Unsteadiness on feet: Secondary | ICD-10-CM | POA: Diagnosis not present

## 2021-12-23 DIAGNOSIS — M9702XD Periprosthetic fracture around internal prosthetic left hip joint, subsequent encounter: Secondary | ICD-10-CM | POA: Diagnosis not present

## 2021-12-23 DIAGNOSIS — M6281 Muscle weakness (generalized): Secondary | ICD-10-CM | POA: Diagnosis not present

## 2021-12-23 DIAGNOSIS — M9702XA Periprosthetic fracture around internal prosthetic left hip joint, initial encounter: Secondary | ICD-10-CM | POA: Diagnosis not present

## 2021-12-23 DIAGNOSIS — R41841 Cognitive communication deficit: Secondary | ICD-10-CM | POA: Diagnosis not present

## 2021-12-23 DIAGNOSIS — Z9181 History of falling: Secondary | ICD-10-CM | POA: Diagnosis not present

## 2021-12-23 DIAGNOSIS — R2681 Unsteadiness on feet: Secondary | ICD-10-CM | POA: Diagnosis not present

## 2021-12-24 ENCOUNTER — Non-Acute Institutional Stay (SKILLED_NURSING_FACILITY): Payer: Medicare Other | Admitting: Internal Medicine

## 2021-12-24 ENCOUNTER — Encounter: Payer: Self-pay | Admitting: Internal Medicine

## 2021-12-24 DIAGNOSIS — M9702XD Periprosthetic fracture around internal prosthetic left hip joint, subsequent encounter: Secondary | ICD-10-CM | POA: Diagnosis not present

## 2021-12-24 DIAGNOSIS — Z952 Presence of prosthetic heart valve: Secondary | ICD-10-CM | POA: Diagnosis not present

## 2021-12-24 DIAGNOSIS — M979XXD Periprosthetic fracture around unspecified internal prosthetic joint, subsequent encounter: Secondary | ICD-10-CM | POA: Diagnosis not present

## 2021-12-24 DIAGNOSIS — R21 Rash and other nonspecific skin eruption: Secondary | ICD-10-CM

## 2021-12-24 DIAGNOSIS — R2681 Unsteadiness on feet: Secondary | ICD-10-CM | POA: Diagnosis not present

## 2021-12-24 DIAGNOSIS — I10 Essential (primary) hypertension: Secondary | ICD-10-CM

## 2021-12-24 DIAGNOSIS — Z9181 History of falling: Secondary | ICD-10-CM | POA: Diagnosis not present

## 2021-12-24 DIAGNOSIS — D62 Acute posthemorrhagic anemia: Secondary | ICD-10-CM

## 2021-12-24 DIAGNOSIS — I4811 Longstanding persistent atrial fibrillation: Secondary | ICD-10-CM | POA: Diagnosis not present

## 2021-12-24 DIAGNOSIS — R41841 Cognitive communication deficit: Secondary | ICD-10-CM | POA: Diagnosis not present

## 2021-12-24 DIAGNOSIS — M9702XA Periprosthetic fracture around internal prosthetic left hip joint, initial encounter: Secondary | ICD-10-CM | POA: Diagnosis not present

## 2021-12-24 DIAGNOSIS — M6281 Muscle weakness (generalized): Secondary | ICD-10-CM | POA: Diagnosis not present

## 2021-12-24 NOTE — Progress Notes (Signed)
Location:   [facility] Room Number: 36 Place of Service:  SNF [phone]) Provider:  Veleta Miners MD  [name], MD  Patient Care Team: [name], MD as PCP - General (Internal Medicine) Leonie Man, MD as PCP - Cardiology (Cardiology) [name], MD as Consulting Physician (Hematology)  Extended Emergency Contact Information Primary Emergency Contact: [facility] Address: [address]          [place], [place] [address] Johnnette Litter of [place] Phone: [phone] Relation: Spouse Secondary Emergency Contact: [place]  [place] Phone: [phone] Mobile Phone: [phone] Relation: Son  Code Status:  DNR Goals of care: Advanced Directive information    12/24/2021    9:51 AM  Advanced Directives  Does Patient Have a Medical Advance Directive? Yes  Type of Advance Directive Out of facility DNR (pink MOST or yellow form);Living will  Does patient want to make changes to medical advance directive? No - Patient declined  Pre-existing out of facility DNR order (yellow form or pink MOST form) Yellow form placed in chart (order not valid for inpatient use);Pink MOST form placed in chart (order not valid for inpatient use)     Chief Complaint  Patient presents with   Acute Visit    Itching    HPI:  Pt is a 88 y.o. male seen today for an acute visit for Itching and Rash BP also running low  IN SNF for  Rehab Admitted from 05/18-05/23 for Periprosthetic Fracture of left hip to be managed Conservatively    Patient has a history of PAF, mechanical mitral valve n Coumadin, BPH, previous history of right and left hip arthroplasty, hypertension  Recent Diagnosis of Colon Cancer with Metastatic to upper abdomen on Keytruda now for Palliative treatment. S/P Colectomy  Seen today for itching  Rash in his Back , Upper thigh area Arms Has been itching Noticed in last few days but now getting  worse  BP noticed to be running on lower side Pain Controlled with Tylenol prn Doing better with his transfers and is WB 50 % now Plans to eventually go to his apartment   Past Medical History:  Diagnosis Date   Anemia    leakoppenia   BPH (benign prostatic hypertrophy)    Bullous pemphigoid    [name], March 2011, right forearm squamous cell carcinoma   Chronic anticoagulation    systemic   Colon polyp    transverse, 2002   History of peptic ulcer    remote, 3/95   Hx of actinic keratosis    Hx of basal cell carcinoma    Hx of squamous cell carcinoma of skin    Hyperlipidemia    Left inguinal hernia    Moderate aortic insufficiency 2009   audible aortic insufficiency on 1/09 echo   PAF (paroxysmal atrial fibrillation) ([place]) 01/17/2014   On Warfarin.   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 Jude mechanical valve   ORIF FEMUR FRACTURE Left 08/28/2020   Procedure: OPEN REDUCTION INTERNAL FIXATION (ORIF) DISTAL FEMUR FRACTURE;  Surgeon: Rod Can, MD;  Location: [place];  Service: Orthopedics;  Laterality: Left;   PARTIAL COLECTOMY N/A 06/30/2021   Procedure: PARTIAL COLECTOMY;  Surgeon: Clovis Riley, MD;  Location: [place];  Service: General;  Laterality: N/A;   TOTAL HIP ARTHROPLASTY Right 10/12/2017    Procedure: RIGHT TOTAL HIP ARTHROPLASTY ANTERIOR APPROACH;  Surgeon: Gaynelle Arabian, MD;  Location: [facility];  Service: Orthopedics;  Laterality: Right;   TRANSTHORACIC ECHOCARDIOGRAM  12/2018   Unable to assess diastolic function because of A. fib. Normal RV function, but moderately elevated RVSP.  Severe biatrial enlargement. S/P St Jude bileaflet mechanical MVR that appears to be functioning normally. Mitral valve regurgitation cannot assess due to mechanical valve shadowing. MV Mean grad: 7.0 mmHg MV Area (PHT): 3.38 cm (stable for valve).  Mild Ao Sclerosis, Mild-Mod AI   TRANSTHORACIC ECHOCARDIOGRAM  08/'17; 10/'18    a) Mild conc LVH. EF 55-60%. No RWMA. Mod AI. Mechanical MV prosthesis functioning properly. LAD dilation.;; b)  EF 55-60%.  Mo AI.  Bileaflet Saint Jude mechanical MV with no paravalvular leak.  Severe LA dilation.  Minimally elevated PAP    Allergies  Allergen Reactions   Flexeril [Cyclobenzaprine] Diarrhea    Allergies as of 12/24/2021       Reactions   Flexeril [cyclobenzaprine] Diarrhea        Medication List        Accurate as of December 24, 2021  9:52 AM. If you have any questions, ask your nurse or doctor.          acetaminophen 500 MG tablet Commonly known as: TYLENOL Take 1,000 mg by mouth 3 (three) times daily as needed for moderate pain.   bisoprolol 5 MG tablet Commonly known as: ZEBETA Take 1 tablet (5 mg total) by mouth daily.   ferrous sulfate 325 (65 FE) MG tablet Take 1 tablet (325 mg total) by mouth daily with breakfast.   fluticasone 50 MCG/ACT nasal spray Commonly known as: FLONASE Place 1 spray into both nostrils daily as needed for allergies or rhinitis.   furosemide 40 MG tablet Commonly known as: LASIX Take 20-40 mg by mouth See admin instructions. Take 20 mg on Monday Wednesday and Friday and 40 mg on all other days.   hydrALAZINE 25 MG tablet Commonly known as: APRESOLINE Take 25 mg by mouth daily at lunch, may take an   extra tablet as needed for systolic BP greater than 620 mmHg daily   isosorbide mononitrate 30 MG 24 hr tablet Commonly known as: IMDUR Take 30 mg by mouth daily.   lactose free nutrition Liqd Take 237 mLs by mouth daily.   loperamide 2 MG capsule Commonly known as: IMODIUM Take 1 capsule (2 mg total) by mouth as needed for diarrhea or loose stools.   LORazepam 0.5 MG tablet Commonly known as: Ativan Take 1 tablet (0.5 mg total) by mouth daily as needed for anxiety.   MULTIVITAMIN PO Take 1 tablet by mouth daily.   oxyCODONE 5 MG immediate release tablet Commonly known as: Oxy IR/ROXICODONE Take 2.5 mg by mouth daily as needed for severe pain.   oxyCODONE 5 MG immediate release tablet Commonly known as: Oxy IR/ROXICODONE Take  5 mg by mouth every 8 (eight) hours as needed for severe pain.   polyethylene glycol 17 g packet Commonly known as: MIRALAX / GLYCOLAX Take 17 g by mouth daily as needed for mild constipation.   PreserVision AREDS 2 Caps Take 1 capsule by mouth daily.   senna 8.6 MG Tabs tablet Commonly known as: SENOKOT Take 2 tablets (17.2 mg total) by mouth at bedtime.   traMADol 50 MG tablet Commonly known as: ULTRAM Take 50 mg by mouth every 8 (eight) hours as needed.   warfarin 2.5 MG tablet Commonly known as: COUMADIN Take as directed by the anticoagulation clinic. If you are unsure how to take this medication, talk to your nurse or doctor. Original instructions: Take 2.5 mg by mouth. Once A Day on Sun, Tue, Thu, Sat   warfarin 5 MG tablet Commonly known as: COUMADIN Take as directed by the anticoagulation clinic. If you are unsure how to take this medication, talk to your nurse or doctor. Original instructions: Take 5 mg by mouth. Once A Day on Mon, Wed, Fri   zinc oxide 20 % ointment Apply 1 application. topically as needed for irritation.        Review of Systems  Constitutional:  Positive for activity change. Negative for appetite change  and unexpected weight change.  HENT: Negative.    Respiratory:  Negative for cough and shortness of breath.   Cardiovascular:  Negative for leg swelling.  Gastrointestinal:  Negative for constipation.  Genitourinary:  Negative for frequency.  Musculoskeletal:  Positive for gait problem. Negative for arthralgias and myalgias.  Skin:  Positive for rash.  Neurological:  Negative for dizziness and weakness.  Psychiatric/Behavioral:  Negative for confusion and sleep disturbance.   All other systems reviewed and are negative.   Immunization History  Administered Date(s) Administered   Influenza Split 02/27/2012, 03/28/2013, 04/10/2014, 05/02/2015, 04/20/2016, 03/28/2017, 03/28/2018, 04/11/2019   Influenza, High Dose Seasonal PF 04/20/2016   Influenza,inj,Quad PF,6+ Mos 03/28/2018   Influenza-Unspecified 03/30/2017, 04/08/2020   Moderna SARS-COV2 Booster Vaccination 12/03/2020   Moderna Sars-Covid-2 Vaccination 07/02/2019, 07/30/2019, 05/12/2020   Pfizer Covid-19 Vaccine Bivalent Booster 42yr & up 04/15/2021   Pneumococcal Conjugate-13 06/10/2014   Pneumococcal Polysaccharide-23 02/02/2006   Pneumococcal-Unspecified 02/02/2006   Td 12/28/2002, 04/11/2014   Tdap 06/10/2014   Zoster, Live 03/29/2015, 11/28/2019, 01/28/2020   Zoster, Unspecified 05/02/2015   Pertinent  Health Maintenance Due  Topic Date Due   INFLUENZA VACCINE  01/26/2022      11/16/2021    1:00 AM 11/16/2021    8:20 AM 11/17/2021   12:00 AM 11/17/2021   10:15 AM 12/11/2021    1:00 PM  Fall Risk  Patient Fall Risk Level High fall risk High fall risk High fall risk High fall risk High fall risk   Functional Status Survey:    Vitals:   12/24/21 0944  BP: (!) 92/50  Pulse: 76  Resp: 18  Temp: (!) 96.7 F (35.9 C)  SpO2: 99%  Weight: 136 lb 9.6 oz (62 kg)  Height: '5\' 11"'$  (1.803 m)   Body mass index is 19.05 kg/m. Physical Exam Vitals reviewed.  Constitutional:      Appearance: Normal appearance.  HENT:      Head: Normocephalic.     Nose: Nose normal.     Mouth/Throat:     Mouth: Mucous membranes are moist.     Pharynx: Oropharynx is clear.  Eyes:     Pupils: Pupils are equal, round, and reactive to light.  Cardiovascular:     Rate and Rhythm: Normal rate and regular rhythm.     Pulses: Normal pulses.     Heart sounds: Murmur heard.  Pulmonary:     Effort: Pulmonary effort is normal. No respiratory distress.     Breath sounds: Normal breath sounds. No rales.  Abdominal:     General: Abdomen is flat. Bowel sounds are normal.     Palpations: Abdomen is soft.  Musculoskeletal:        General: Swelling present.     Cervical back: Neck supple.     Comments: Chronic Changes  Skin:    General: Skin is warm.     Comments: Maculopapular rash in back, Thighs arms  Neurological:     General: No focal deficit present.     Mental Status: He is alert and oriented to person, place, and time.  Psychiatric:        Mood and Affect: Mood normal.        Thought Content: Thought content normal.     Labs reviewed: Recent Labs    07/06/21 0156 07/06/21 1216 11/13/21 0347 11/14/21 0331 11/16/21 0333 11/17/21 0311 11/20/21 0000 12/11/21 1321  NA  --    < > 134*   < > 136 135 137 131*  K  --    < > 3.8   < > 4.6 4.9 4.3 4.6  CL  --    < > 103   < > 105 106 105 100  CO2  --    < > 26   < > '25 25 22 25  '$ GLUCOSE  --    < > 97   < > 126* 97  --  123*  BUN  --    < > 43*   < > 29* 34* 35* 37*  CREATININE  --    < > 1.60*   < > 1.22 1.20 1.1 1.66*  CALCIUM  --    < > 8.2*   < > 8.6* 8.6* 8.3* 9.3  MG  --    < > 2.3  --  2.3 2.2  --   --   PHOS 3.0  --   --   --  2.7 2.7  --   --    < > = values in this interval not displayed.   Recent Labs    11/16/21 0333 11/17/21 0311 11/20/21 0000 12/11/21 1321  AST '23 26 29 26  '$ ALT '13 16 17 15  '$ ALKPHOS 43 44 51 98  BILITOT 0.9 0.8  --  0.8  PROT 5.7* 5.9*  --  7.2  ALBUMIN 2.7* 2.7* 3.1* 3.6   Recent Labs    11/16/21 0333 11/17/21 0311  11/20/21 0000 12/11/21 1321  WBC 3.0* 3.5* 3.5 3.9*  NEUTROABS 1.9 2.2  --  2.8  HGB 8.1* 8.2* 8.7* 9.5*  HCT 25.1* 26.3* 27* 28.6*  MCV 98.0 97.8  --  97.3  PLT 145* 154 190 186   Lab Results  Component Value Date   TSH 3.784 12/11/2021   No results found for: "HGBA1C" No results found for: "CHOL", "HDL", "LDLCALC", "LDLDIRECT", "TRIG", "CHOLHDL"  Significant Diagnostic Results in last 30 days:  No results found.  Assessment/Plan 1. Rash ? Etiology Can be due to [facility].  Also Discontinue Oxycodone though patient has not been taking it D/W Dr Burr Medico  Will do Triamcinolone Ointment for now BID  Cannot use Antihistamine or Prednisone due to being Counter intuitive with [facility]  2. Essential hypertension BP is running low Discontinue Hydralazine Continue Zebeta   3. S/P MVR (mitral valve replacement) On Coumadin 2.5-3.5 target   -4. Acute blood loss anemia 9.5 on iron  5. Periprosthetic fracture around internal prosthetic joint, subsequent encounter Pain control with Tylenol 50% weight bearing 2 person Assist  6. Longstanding persistent atrial fibrillation: CHA2DS2-VASc Score 3 On Coumadin and Zebeta  7 3. H/O colon cancer, stage III On Immunotherapy for Palliation S/p Colectomy   Family/ staff Communication:   Lbs/tests ordered:

## 2021-12-25 DIAGNOSIS — M9702XD Periprosthetic fracture around internal prosthetic left hip joint, subsequent encounter: Secondary | ICD-10-CM | POA: Diagnosis not present

## 2021-12-25 DIAGNOSIS — M9702XA Periprosthetic fracture around internal prosthetic left hip joint, initial encounter: Secondary | ICD-10-CM | POA: Diagnosis not present

## 2021-12-25 DIAGNOSIS — M6281 Muscle weakness (generalized): Secondary | ICD-10-CM | POA: Diagnosis not present

## 2021-12-25 DIAGNOSIS — Z9181 History of falling: Secondary | ICD-10-CM | POA: Diagnosis not present

## 2021-12-25 DIAGNOSIS — R2681 Unsteadiness on feet: Secondary | ICD-10-CM | POA: Diagnosis not present

## 2021-12-25 DIAGNOSIS — R41841 Cognitive communication deficit: Secondary | ICD-10-CM | POA: Diagnosis not present

## 2021-12-27 DIAGNOSIS — R41841 Cognitive communication deficit: Secondary | ICD-10-CM | POA: Diagnosis not present

## 2021-12-27 DIAGNOSIS — M9702XD Periprosthetic fracture around internal prosthetic left hip joint, subsequent encounter: Secondary | ICD-10-CM | POA: Diagnosis not present

## 2021-12-27 DIAGNOSIS — Z9181 History of falling: Secondary | ICD-10-CM | POA: Diagnosis not present

## 2021-12-27 DIAGNOSIS — S300XXD Contusion of lower back and pelvis, subsequent encounter: Secondary | ICD-10-CM | POA: Diagnosis not present

## 2021-12-27 DIAGNOSIS — M6281 Muscle weakness (generalized): Secondary | ICD-10-CM | POA: Diagnosis not present

## 2021-12-27 DIAGNOSIS — R2681 Unsteadiness on feet: Secondary | ICD-10-CM | POA: Diagnosis not present

## 2021-12-27 DIAGNOSIS — M9702XA Periprosthetic fracture around internal prosthetic left hip joint, initial encounter: Secondary | ICD-10-CM | POA: Diagnosis not present

## 2021-12-28 DIAGNOSIS — Z9181 History of falling: Secondary | ICD-10-CM | POA: Diagnosis not present

## 2021-12-28 DIAGNOSIS — M6281 Muscle weakness (generalized): Secondary | ICD-10-CM | POA: Diagnosis not present

## 2021-12-28 DIAGNOSIS — R2681 Unsteadiness on feet: Secondary | ICD-10-CM | POA: Diagnosis not present

## 2021-12-28 DIAGNOSIS — M9702XA Periprosthetic fracture around internal prosthetic left hip joint, initial encounter: Secondary | ICD-10-CM | POA: Diagnosis not present

## 2021-12-28 DIAGNOSIS — M9702XD Periprosthetic fracture around internal prosthetic left hip joint, subsequent encounter: Secondary | ICD-10-CM | POA: Diagnosis not present

## 2021-12-28 DIAGNOSIS — R41841 Cognitive communication deficit: Secondary | ICD-10-CM | POA: Diagnosis not present

## 2021-12-29 DIAGNOSIS — R2681 Unsteadiness on feet: Secondary | ICD-10-CM | POA: Diagnosis not present

## 2021-12-29 DIAGNOSIS — R41841 Cognitive communication deficit: Secondary | ICD-10-CM | POA: Diagnosis not present

## 2021-12-29 DIAGNOSIS — M9702XD Periprosthetic fracture around internal prosthetic left hip joint, subsequent encounter: Secondary | ICD-10-CM | POA: Diagnosis not present

## 2021-12-29 DIAGNOSIS — Z9181 History of falling: Secondary | ICD-10-CM | POA: Diagnosis not present

## 2021-12-29 DIAGNOSIS — M6281 Muscle weakness (generalized): Secondary | ICD-10-CM | POA: Diagnosis not present

## 2021-12-29 DIAGNOSIS — M9702XA Periprosthetic fracture around internal prosthetic left hip joint, initial encounter: Secondary | ICD-10-CM | POA: Diagnosis not present

## 2021-12-30 DIAGNOSIS — R41841 Cognitive communication deficit: Secondary | ICD-10-CM | POA: Diagnosis not present

## 2021-12-30 DIAGNOSIS — R2681 Unsteadiness on feet: Secondary | ICD-10-CM | POA: Diagnosis not present

## 2021-12-30 DIAGNOSIS — M9702XA Periprosthetic fracture around internal prosthetic left hip joint, initial encounter: Secondary | ICD-10-CM | POA: Diagnosis not present

## 2021-12-30 DIAGNOSIS — M6281 Muscle weakness (generalized): Secondary | ICD-10-CM | POA: Diagnosis not present

## 2021-12-30 DIAGNOSIS — Z9181 History of falling: Secondary | ICD-10-CM | POA: Diagnosis not present

## 2021-12-30 DIAGNOSIS — M9702XD Periprosthetic fracture around internal prosthetic left hip joint, subsequent encounter: Secondary | ICD-10-CM | POA: Diagnosis not present

## 2021-12-31 DIAGNOSIS — M6281 Muscle weakness (generalized): Secondary | ICD-10-CM | POA: Diagnosis not present

## 2021-12-31 DIAGNOSIS — R2681 Unsteadiness on feet: Secondary | ICD-10-CM | POA: Diagnosis not present

## 2021-12-31 DIAGNOSIS — R41841 Cognitive communication deficit: Secondary | ICD-10-CM | POA: Diagnosis not present

## 2021-12-31 DIAGNOSIS — Z9181 History of falling: Secondary | ICD-10-CM | POA: Diagnosis not present

## 2021-12-31 DIAGNOSIS — M9702XD Periprosthetic fracture around internal prosthetic left hip joint, subsequent encounter: Secondary | ICD-10-CM | POA: Diagnosis not present

## 2021-12-31 DIAGNOSIS — M9702XA Periprosthetic fracture around internal prosthetic left hip joint, initial encounter: Secondary | ICD-10-CM | POA: Diagnosis not present

## 2022-01-01 ENCOUNTER — Inpatient Hospital Stay: Payer: Medicare Other | Attending: Hematology

## 2022-01-01 ENCOUNTER — Ambulatory Visit (HOSPITAL_COMMUNITY)
Admission: RE | Admit: 2022-01-01 | Discharge: 2022-01-01 | Disposition: A | Discharge status: Home / Self Care | Payer: Medicare Other | Source: Ambulatory Visit | Attending: Adult Health | Admitting: Adult Health

## 2022-01-01 ENCOUNTER — Other Ambulatory Visit: Payer: Self-pay

## 2022-01-01 DIAGNOSIS — Z79899 Other long term (current) drug therapy: Secondary | ICD-10-CM | POA: Diagnosis not present

## 2022-01-01 DIAGNOSIS — K4090 Unilateral inguinal hernia, without obstruction or gangrene, not specified as recurrent: Secondary | ICD-10-CM | POA: Diagnosis not present

## 2022-01-01 DIAGNOSIS — K7689 Other specified diseases of liver: Secondary | ICD-10-CM | POA: Diagnosis not present

## 2022-01-01 DIAGNOSIS — C184 Malignant neoplasm of transverse colon: Secondary | ICD-10-CM | POA: Insufficient documentation

## 2022-01-01 DIAGNOSIS — D500 Iron deficiency anemia secondary to blood loss (chronic): Secondary | ICD-10-CM

## 2022-01-01 DIAGNOSIS — C189 Malignant neoplasm of colon, unspecified: Secondary | ICD-10-CM | POA: Diagnosis not present

## 2022-01-01 DIAGNOSIS — Z5112 Encounter for antineoplastic immunotherapy: Secondary | ICD-10-CM | POA: Diagnosis not present

## 2022-01-01 LAB — CMP (CANCER CENTER ONLY)
ALT: 19 U/L (ref 0–44)
AST: 30 U/L (ref 15–41)
Albumin: 3.7 g/dL (ref 3.5–5.0)
Alkaline Phosphatase: 67 U/L (ref 38–126)
Anion gap: 5 (ref 5–15)
BUN: 36 mg/dL — ABNORMAL HIGH (ref 8–23)
CO2: 27 mmol/L (ref 22–32)
Calcium: 9.3 mg/dL (ref 8.9–10.3)
Chloride: 98 mmol/L (ref 98–111)
Creatinine: 1.36 mg/dL — ABNORMAL HIGH (ref 0.61–1.24)
GFR, Estimated: 48 mL/min — ABNORMAL LOW (ref >60)
Glucose, Bld: 104 mg/dL — ABNORMAL HIGH (ref 70–99)
Potassium: 4.3 mmol/L (ref 3.5–5.1)
Sodium: 130 mmol/L — ABNORMAL LOW (ref 135–145)
Total Bilirubin: 0.7 mg/dL (ref 0.3–1.2)
Total Protein: 7.4 g/dL (ref 6.5–8.1)

## 2022-01-01 LAB — CBC WITH DIFFERENTIAL (CANCER CENTER ONLY)
Abs Immature Granulocytes: 0.06 10*3/uL (ref 0.00–0.07)
Basophils Absolute: 0 10*3/uL (ref 0.0–0.1)
Basophils Relative: 1 %
Eosinophils Absolute: 0.3 10*3/uL (ref 0.0–0.5)
Eosinophils Relative: 7 %
HCT: 30.1 % — ABNORMAL LOW (ref 39.0–52.0)
Hemoglobin: 9.7 g/dL — ABNORMAL LOW (ref 13.0–17.0)
Immature Granulocytes: 1 %
Lymphocytes Relative: 8 %
Lymphs Abs: 0.3 10*3/uL — ABNORMAL LOW (ref 0.7–4.0)
MCH: 32 pg (ref 26.0–34.0)
MCHC: 32.2 g/dL (ref 30.0–36.0)
MCV: 99.3 fL (ref 80.0–100.0)
Monocytes Absolute: 0.4 10*3/uL (ref 0.1–1.0)
Monocytes Relative: 9 %
Neutro Abs: 3.1 10*3/uL (ref 1.7–7.7)
Neutrophils Relative %: 74 %
Platelet Count: 175 10*3/uL (ref 150–400)
RBC: 3.03 MIL/uL — ABNORMAL LOW (ref 4.22–5.81)
RDW: 17.8 % — ABNORMAL HIGH (ref 11.5–15.5)
WBC Count: 4.2 10*3/uL (ref 4.0–10.5)
nRBC: 0 % (ref 0.0–0.2)

## 2022-01-01 LAB — FERRITIN: Ferritin: 182 ng/mL (ref 24–336)

## 2022-01-01 LAB — TSH: TSH: 4.946 u[IU]/mL — ABNORMAL HIGH (ref 0.350–4.500)

## 2022-01-01 LAB — CEA (IN HOUSE-CHCC): CEA (CHCC-In House): 8.37 ng/mL — ABNORMAL HIGH (ref 0.00–5.00)

## 2022-01-01 MED ORDER — IOHEXOL 300 MG/ML  SOLN
100.0000 mL | Freq: Once | INTRAMUSCULAR | Status: AC | PRN
  Administered 2022-01-01: 100 mL via INTRAVENOUS

## 2022-01-02 LAB — T4: T4, Total: 5.2 ug/dL (ref 4.5–12.0)

## 2022-01-04 ENCOUNTER — Telehealth: Payer: Self-pay

## 2022-01-04 ENCOUNTER — Telehealth: Payer: Medicare Other | Admitting: Hematology

## 2022-01-04 DIAGNOSIS — M6281 Muscle weakness (generalized): Secondary | ICD-10-CM | POA: Diagnosis not present

## 2022-01-04 DIAGNOSIS — M9702XA Periprosthetic fracture around internal prosthetic left hip joint, initial encounter: Secondary | ICD-10-CM | POA: Diagnosis not present

## 2022-01-04 DIAGNOSIS — R2681 Unsteadiness on feet: Secondary | ICD-10-CM | POA: Diagnosis not present

## 2022-01-04 DIAGNOSIS — M9702XD Periprosthetic fracture around internal prosthetic left hip joint, subsequent encounter: Secondary | ICD-10-CM | POA: Diagnosis not present

## 2022-01-04 DIAGNOSIS — R41841 Cognitive communication deficit: Secondary | ICD-10-CM | POA: Diagnosis not present

## 2022-01-04 DIAGNOSIS — Z9181 History of falling: Secondary | ICD-10-CM | POA: Diagnosis not present

## 2022-01-04 NOTE — Telephone Encounter (Signed)
This nurse received a phone message from this patients son.  He is requesting to receive a phone call from provider to go over results of CT scan.  Son states that he is with the patient today and would like to know about the test results and he will not be able to attend the next office visit appointment.  This request will be forwarded to the provider.

## 2022-01-05 ENCOUNTER — Encounter: Payer: Self-pay | Admitting: Adult Health

## 2022-01-05 ENCOUNTER — Non-Acute Institutional Stay: Payer: Medicare Other | Admitting: Adult Health

## 2022-01-05 ENCOUNTER — Telehealth: Payer: Self-pay

## 2022-01-05 ENCOUNTER — Telehealth: Payer: Medicare Other | Admitting: Hematology

## 2022-01-05 DIAGNOSIS — Z7901 Long term (current) use of anticoagulants: Secondary | ICD-10-CM

## 2022-01-05 DIAGNOSIS — M9702XD Periprosthetic fracture around internal prosthetic left hip joint, subsequent encounter: Secondary | ICD-10-CM | POA: Diagnosis not present

## 2022-01-05 DIAGNOSIS — Z9181 History of falling: Secondary | ICD-10-CM | POA: Diagnosis not present

## 2022-01-05 DIAGNOSIS — I4811 Longstanding persistent atrial fibrillation: Secondary | ICD-10-CM | POA: Diagnosis not present

## 2022-01-05 DIAGNOSIS — Z952 Presence of prosthetic heart valve: Secondary | ICD-10-CM

## 2022-01-05 DIAGNOSIS — R2681 Unsteadiness on feet: Secondary | ICD-10-CM | POA: Diagnosis not present

## 2022-01-05 DIAGNOSIS — M6281 Muscle weakness (generalized): Secondary | ICD-10-CM | POA: Diagnosis not present

## 2022-01-05 DIAGNOSIS — Z5181 Encounter for therapeutic drug level monitoring: Secondary | ICD-10-CM

## 2022-01-05 DIAGNOSIS — R41841 Cognitive communication deficit: Secondary | ICD-10-CM | POA: Diagnosis not present

## 2022-01-05 DIAGNOSIS — M9702XA Periprosthetic fracture around internal prosthetic left hip joint, initial encounter: Secondary | ICD-10-CM | POA: Diagnosis not present

## 2022-01-05 NOTE — Telephone Encounter (Signed)
Pt overdue for anticoagulation appt. Called and spoke with pt's son. Pt is currently in SNF and his INR/Warfarin is being managed by the facility. Pt's son stated he may be discharge to independent living soon and will call back to schedule an appt.

## 2022-01-05 NOTE — Progress Notes (Signed)
Location:  [place] [facility] Room Number: NO/36/A Place of Service:  SNF (31) Provider:  Durenda Age, DNP, FNP-BC  Patient Care Team: [name], MD as PCP - General (Internal Medicine) Leonie Man, MD as PCP - Cardiology (Cardiology) [name], MD as Consulting Physician (Hematology)  Extended Emergency Contact Information Primary Emergency Contact: Chrissie Noa of [place] Phone: [phone] Mobile Phone: [phone] Relation: Son  Code Status:  DNR  Goals of care: Advanced Directive information    01/05/2022    4:38 PM  Advanced Directives  Does Patient Have a Medical Advance Directive? Yes  Type of Advance Directive Out of facility DNR (pink MOST or yellow form);Living will  Does patient want to make changes to medical advance directive? No - Patient declined  Pre-existing out of facility DNR order (yellow form or pink MOST form) Yellow form placed in chart (order not valid for inpatient use);Pink MOST form placed in chart (order not valid for inpatient use)     Chief Complaint  Patient presents with   Acute Visit    Patient is being seen for coumadin therapy    HPI:  Pt is a 88 y.o. male seen today for an acute visit for Coumadin therapy. He is a long-term care resident of [facility]. He takes Coumadin for atrial fibrillation and S/P Mitral valve replacement. He currently takes Coumadin 5 mg daily on MWF and 2.5 mg on TTHSatSun. INR today 3.3, supratherapeutic. No noted bleeding nor bruising.   Past Medical History:  Diagnosis Date   Anemia    leakoppenia   BPH (benign prostatic hypertrophy)    Bullous pemphigoid    [name], March 2011, right forearm squamous cell carcinoma   Chronic anticoagulation    systemic   Colon polyp    transverse, 2002   History of peptic ulcer    remote, 3/95   Hx of actinic keratosis    Hx of basal cell carcinoma    Hx of squamous cell carcinoma of skin     Hyperlipidemia    Left inguinal hernia    Moderate aortic insufficiency 2009   audible aortic insufficiency on 1/09 echo   PAF (paroxysmal atrial fibrillation) ([place]) 01/17/2014   On Warfarin.   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 Jude mechanical valve   ORIF FEMUR FRACTURE Left 08/28/2020   Procedure: OPEN REDUCTION INTERNAL FIXATION (ORIF) DISTAL FEMUR FRACTURE;  Surgeon: Rod Can, MD;  Location: [place];  Service: Orthopedics;  Laterality: Left;   PARTIAL COLECTOMY N/A 06/30/2021   Procedure: PARTIAL COLECTOMY;  Surgeon: Clovis Riley, MD;  Location: [place];  Service: General;  Laterality: N/A;   TOTAL HIP ARTHROPLASTY Right 10/12/2017   Procedure: RIGHT TOTAL HIP ARTHROPLASTY ANTERIOR  APPROACH;  Surgeon: Gaynelle Arabian, MD;  Location: [facility];  Service: Orthopedics;  Laterality: Right;   TRANSTHORACIC ECHOCARDIOGRAM  12/2018   Unable to assess diastolic function because of A. fib. Normal RV function, but moderately elevated RVSP.  Severe biatrial enlargement. S/P St Jude bileaflet mechanical MVR that appears to be functioning normally. Mitral valve regurgitation cannot assess due to mechanical valve shadowing. MV Mean grad: 7.0 mmHg MV Area (PHT): 3.38 cm (stable for valve).  Mild Ao Sclerosis, Mild-Mod AI    TRANSTHORACIC ECHOCARDIOGRAM  08/'17; 10/'18    a) Mild conc LVH. EF 55-60%. No RWMA. Mod AI. Mechanical MV prosthesis functioning properly. LAD dilation.;; b)  EF 55-60%.  Mo AI.  Bileaflet Saint Jude mechanical MV with no paravalvular leak.  Severe LA dilation.  Minimally elevated PAP    Allergies  Allergen Reactions   Flexeril [Cyclobenzaprine] Diarrhea    Outpatient Encounter Medications as of 01/05/2022  Medication Sig   acetaminophen (TYLENOL) 500 MG tablet Take 1,000 mg by mouth 3 (three) times daily as needed for moderate pain.   bisoprolol (ZEBETA) 5 MG tablet Take 1 tablet (5 mg total) by mouth daily.   ferrous sulfate 325 (65 FE) MG tablet Take 1 tablet (325 mg total) by mouth daily with breakfast.   fluticasone (FLONASE) 50 MCG/ACT nasal spray Place 1 spray into both nostrils daily as needed for allergies or rhinitis.   furosemide (LASIX) 40 MG tablet Take 20-40 mg by mouth See admin instructions. Take 20 mg on Monday Wednesday and Friday and 40 mg on all other days.   isosorbide mononitrate (IMDUR) 30 MG 24 hr tablet Take 30 mg by mouth daily.   lactose free nutrition (BOOST) LIQD Take 237 mLs by mouth daily.   loperamide (IMODIUM) 2 MG capsule Take 1 capsule (2 mg total) by mouth as needed for diarrhea or loose stools.   LORazepam (ATIVAN) 0.5 MG tablet Take 1 tablet (0.5 mg total) by mouth daily as needed for anxiety.   Multiple Vitamins-Minerals (MULTIVITAMIN PO) Take 1 tablet by mouth daily.   Multiple Vitamins-Minerals (PRESERVISION AREDS 2) CAPS Take 1 capsule by mouth daily.   polyethylene glycol (MIRALAX / GLYCOLAX) 17 g packet Take 17 g by mouth daily as needed for mild constipation.   senna (SENOKOT) 8.6 MG TABS tablet Take 2 tablets (17.2 mg total) by mouth at bedtime.   traMADol (ULTRAM) 50 MG tablet Take 50 mg by mouth every 8 (eight) hours as needed.   warfarin (COUMADIN) 2.5 MG tablet Take 2.5 mg by mouth. Once A Day on Sun, Tue, Thu, Sat   warfarin (COUMADIN) 5  MG tablet Take 5 mg by mouth. Once A Day on Mon, Wed, Fri   zinc oxide 20 % ointment Apply 1 application. topically as needed for irritation.   No facility-administered encounter medications on file as of 01/05/2022.    Review of Systems  Constitutional:  Negative for activity change and fever.  HENT:  Negative for sore throat.   Eyes: Negative.   Cardiovascular:  Negative for chest pain and leg swelling.  Gastrointestinal:  Negative for abdominal distention, diarrhea and vomiting.  Genitourinary:  Negative for dysuria, frequency and urgency.  Skin:  Negative for color change.  Neurological:  Negative for dizziness and headaches.  Psychiatric/Behavioral:  Negative for behavioral problems and sleep disturbance. The patient is not nervous/anxious.        Immunization History  Administered Date(s) Administered   Influenza Split 02/27/2012, 03/28/2013, 04/10/2014, 05/02/2015, 04/20/2016, 03/28/2017, 03/28/2018,  04/11/2019   Influenza, High Dose Seasonal PF 04/20/2016   Influenza,inj,Quad PF,6+ Mos 03/28/2018   Influenza-Unspecified 03/30/2017, 04/08/2020   Moderna SARS-COV2 Booster Vaccination 12/03/2020   Moderna Sars-Covid-2 Vaccination 07/02/2019, 07/30/2019, 05/12/2020   Pfizer Covid-19 Vaccine Bivalent Booster 76yr & up 04/15/2021   Pneumococcal Conjugate-13 06/10/2014   Pneumococcal Polysaccharide-23 02/02/2006   Pneumococcal-Unspecified 02/02/2006   Td 12/28/2002, 04/11/2014   Tdap 06/10/2014   Zoster, Live 03/29/2015, 11/28/2019, 01/28/2020   Zoster, Unspecified 05/02/2015   Pertinent  Health Maintenance Due  Topic Date Due   INFLUENZA VACCINE  01/26/2022      11/16/2021    1:00 AM 11/16/2021    8:20 AM 11/17/2021   12:00 AM 11/17/2021   10:15 AM 12/11/2021    1:00 PM  Fall Risk  Patient Fall Risk Level High fall risk High fall risk High fall risk High fall risk High fall risk     Vitals:   01/05/22 1639  Pulse: 68  Resp: 18  Temp: (!) 97.3 F (36.3 C)  SpO2:  96%  Weight: 133 lb (60.3 kg)  Height: '5\' 11"'$  (1.803 m)   Body mass index is 18.55 kg/m.  Physical Exam Constitutional:      Appearance: Normal appearance.  HENT:     Head: Normocephalic and atraumatic.     Mouth/Throat:     Mouth: Mucous membranes are moist.  Eyes:     Conjunctiva/sclera: Conjunctivae normal.  Cardiovascular:     Rate and Rhythm: Normal rate. Rhythm irregular.     Pulses: Normal pulses.     Heart sounds: Normal heart sounds.  Pulmonary:     Effort: Pulmonary effort is normal.     Breath sounds: Normal breath sounds.  Abdominal:     General: Bowel sounds are normal.     Palpations: Abdomen is soft.  Musculoskeletal:        General: No swelling. Normal range of motion.     Cervical back: Normal range of motion.  Skin:    General: Skin is warm and dry.  Neurological:     General: No focal deficit present.     Mental Status: He is alert.  Psychiatric:        Mood and Affect: Mood normal.        Behavior: Behavior normal.        Labs reviewed: Recent Labs    07/06/21 0156 07/06/21 1216 11/13/21 0347 11/14/21 0331 11/16/21 0333 11/17/21 0311 11/20/21 0000 12/11/21 1321 01/01/22 1050  NA  --    < > 134*   < > 136 135 137 131* 130*  K  --    < > 3.8   < > 4.6 4.9 4.3 4.6 4.3  CL  --    < > 103   < > 105 106 105 100 98  CO2  --    < > 26   < > '25 25 22 25 27  '$ GLUCOSE  --    < > 97   < > 126* 97  --  123* 104*  BUN  --    < > 43*   < > 29* 34* 35* 37* 36*  CREATININE  --    < > 1.60*   < > 1.22 1.20 1.1 1.66* 1.36*  CALCIUM  --    < > 8.2*   < > 8.6* 8.6* 8.3* 9.3 9.3  MG  --    < > 2.3  --  2.3 2.2  --   --   --  PHOS 3.0  --   --   --  2.7 2.7  --   --   --    < > = values in this interval not displayed.   Recent Labs    11/17/21 0311 11/20/21 0000 12/11/21 1321 01/01/22 1050  AST '26 29 26 30  '$ ALT '16 17 15 19  '$ ALKPHOS 44 51 98 67  BILITOT 0.8  --  0.8 0.7  PROT 5.9*  --  7.2 7.4  ALBUMIN 2.7* 3.1* 3.6 3.7   Recent Labs     11/17/21 0311 11/20/21 0000 12/11/21 1321 01/01/22 1050  WBC 3.5* 3.5 3.9* 4.2  NEUTROABS 2.2  --  2.8 3.1  HGB 8.2* 8.7* 9.5* 9.7*  HCT 26.3* 27* 28.6* 30.1*  MCV 97.8  --  97.3 99.3  PLT 154 190 186 175   Lab Results  Component Value Date   TSH 4.946 (H) 01/01/2022   No results found for: "HGBA1C" No results found for: "CHOL", "HDL", "LDLCALC", "LDLDIRECT", "TRIG", "CHOLHDL"  Significant Diagnostic Results in last 30 days:  CT Abdomen Pelvis W Contrast  Result Date: 01/02/2022 CLINICAL DATA:  Assess cancer response, colon cancer. EXAM: CT ABDOMEN AND PELVIS WITH CONTRAST TECHNIQUE: Multidetector CT imaging of the abdomen and pelvis was performed using the standard protocol following bolus administration of intravenous contrast. RADIATION DOSE REDUCTION: This exam was performed according to the departmental dose-optimization program which includes automated exposure control, adjustment of the mA and/or kV according to patient size and/or use of iterative reconstruction technique. CONTRAST:  142m OMNIPAQUE IOHEXOL 300 MG/ML  SOLN COMPARISON:  PET-CT 10/16/2021.  CT abdomen and pelvis 09/17/2021. FINDINGS: Lower chest: Heart is enlarged. No acute findings in the lung bases. Hepatobiliary: Stable 8 mm hypodense lesion in the left lobe of the liver. No new liver lesions are seen. Gallbladder and bile ducts are within normal limits. Pancreas: Unremarkable. No pancreatic ductal dilatation or surrounding inflammatory changes. Spleen: Normal in size without focal abnormality. Adrenals/Urinary Tract: Bladder not well seen secondary to streak artifact in the pelvis. Kidneys and adrenal glands are within normal limits. There is a hypodensity in the inferior pole the right kidney which is favored as a small cyst. Stomach/Bowel: Right hemicolectomy changes are present. No evidence for bowel obstruction, wall thickening or inflammation. There is a large amount of stool throughout the colon. The stomach  appears within normal limits. Vascular/Lymphatic: Aorta and IVC are normal in size. There are atherosclerotic calcifications of the aorta. There is an enlarged gastrohepatic lymph node measuring 6.1 x 6.5 cm. On previous study this appeared to be 3 separate lymph nodes. Overall size has increased. Previously identified retrocaval lymph node has decreased in size now measuring 1.3 x 2.1 cm image 2/16. Previously identified enlarged retrocaval lymph node at the level of the kidneys has decreased in size now measuring 1.7 x 0.8 cm. No new enlarged lymph nodes are identified. Reproductive: Prostate gland not well evaluated secondary to streak artifact in the pelvis. Other: Left inguinal hernia containing nondilated bowel is again seen. There is no ascites or free air. Musculoskeletal: L1 compression deformity is stable from prior. Sclerosis of T11 vertebral body again noted. Bilateral hip arthroplasties are present. There is a healed left inferior pubic ramus fracture. IMPRESSION: 1. Mixed response to therapy. Enlarging gastrohepatic lymph nodes. Retrocaval lymph nodes are decreasing in size. No new lymphadenopathy or metastatic disease identified. 2. Right hemicolectomy changes. No bowel obstruction. Large stool burden. 3. Left inguinal hernia containing nondilated bowel. 4.  Aortic Atherosclerosis (ICD10-I70.0). Electronically Signed   By: Ronney Asters M.D.   On: 01/02/2022 20:28    Assessment/Plan  1. Encounter for monitoring Coumadin therapy -   INR 3.3, supratherapeutic, hold Coumadin today and re-check INR on 01/06/22  2. Longstanding persistent atrial fibrillation: CHA2DS2-VASc Score 3 S/P MVR (mitral valve replacement) -  will hold Coumadin for today due to supratherapeutic INR -  continue Bisoprolol for rate control   Family/ staff Communication: Discussed plan of care with resident and charge nurse.  Labs/tests ordered:   INR on 01/06/22    Durenda Age, DNP, MSN, FNP-BC [place] [phone] (Monday-Friday 8:00 a.m. - 5:00 p.m.) [phone] (after hours)

## 2022-01-06 DIAGNOSIS — M6281 Muscle weakness (generalized): Secondary | ICD-10-CM | POA: Diagnosis not present

## 2022-01-06 DIAGNOSIS — R2681 Unsteadiness on feet: Secondary | ICD-10-CM | POA: Diagnosis not present

## 2022-01-06 DIAGNOSIS — R41841 Cognitive communication deficit: Secondary | ICD-10-CM | POA: Diagnosis not present

## 2022-01-06 DIAGNOSIS — M9702XA Periprosthetic fracture around internal prosthetic left hip joint, initial encounter: Secondary | ICD-10-CM | POA: Diagnosis not present

## 2022-01-06 DIAGNOSIS — Z9181 History of falling: Secondary | ICD-10-CM | POA: Diagnosis not present

## 2022-01-06 DIAGNOSIS — M9702XD Periprosthetic fracture around internal prosthetic left hip joint, subsequent encounter: Secondary | ICD-10-CM | POA: Diagnosis not present

## 2022-01-07 ENCOUNTER — Non-Acute Institutional Stay (SKILLED_NURSING_FACILITY): Payer: Medicare Other | Admitting: Internal Medicine

## 2022-01-07 ENCOUNTER — Encounter: Payer: Self-pay | Admitting: Internal Medicine

## 2022-01-07 DIAGNOSIS — I10 Essential (primary) hypertension: Secondary | ICD-10-CM | POA: Diagnosis not present

## 2022-01-07 DIAGNOSIS — Z952 Presence of prosthetic heart valve: Secondary | ICD-10-CM | POA: Diagnosis not present

## 2022-01-07 DIAGNOSIS — M9702XD Periprosthetic fracture around internal prosthetic left hip joint, subsequent encounter: Secondary | ICD-10-CM | POA: Diagnosis not present

## 2022-01-07 DIAGNOSIS — M979XXD Periprosthetic fracture around unspecified internal prosthetic joint, subsequent encounter: Secondary | ICD-10-CM

## 2022-01-07 DIAGNOSIS — N400 Enlarged prostate without lower urinary tract symptoms: Secondary | ICD-10-CM | POA: Diagnosis not present

## 2022-01-07 DIAGNOSIS — N1832 Chronic kidney disease, stage 3b: Secondary | ICD-10-CM | POA: Diagnosis not present

## 2022-01-07 DIAGNOSIS — D62 Acute posthemorrhagic anemia: Secondary | ICD-10-CM | POA: Diagnosis not present

## 2022-01-07 DIAGNOSIS — R21 Rash and other nonspecific skin eruption: Secondary | ICD-10-CM

## 2022-01-07 DIAGNOSIS — Z9181 History of falling: Secondary | ICD-10-CM | POA: Diagnosis not present

## 2022-01-07 DIAGNOSIS — M6281 Muscle weakness (generalized): Secondary | ICD-10-CM | POA: Diagnosis not present

## 2022-01-07 DIAGNOSIS — M9702XA Periprosthetic fracture around internal prosthetic left hip joint, initial encounter: Secondary | ICD-10-CM | POA: Diagnosis not present

## 2022-01-07 DIAGNOSIS — I4811 Longstanding persistent atrial fibrillation: Secondary | ICD-10-CM

## 2022-01-07 DIAGNOSIS — R41841 Cognitive communication deficit: Secondary | ICD-10-CM | POA: Diagnosis not present

## 2022-01-07 DIAGNOSIS — R2681 Unsteadiness on feet: Secondary | ICD-10-CM | POA: Diagnosis not present

## 2022-01-07 NOTE — Progress Notes (Signed)
Location:   [facility] Room Number: [id number] Place of Service:  SNF [phone]) Provider:  Veleta Miners MD  [name], MD  Patient Care Team: [name], MD as PCP - General (Internal Medicine) Leonie Man, MD as PCP - Cardiology (Cardiology) [name], MD as Consulting Physician (Hematology)  Extended Emergency Contact Information Primary Emergency Contact: Chrissie Noa of [place] Phone: [phone] Mobile Phone: [phone] Relation: Son  Code Status:  DNR Goals of care: Advanced Directive information    01/07/2022    4:03 PM  Advanced Directives  Does Patient Have a Medical Advance Directive? Yes  Type of Advance Directive Out of facility DNR (pink MOST or yellow form);Living will  Does patient want to make changes to medical advance directive? No - Patient declined  Pre-existing out of facility DNR order (yellow form or pink MOST form) Yellow form placed in chart (order not valid for inpatient use);Pink MOST form placed in chart (order not valid for inpatient use)     Chief Complaint  Patient presents with   Acute Visit    HPI:  Pt is a 88 y.o. male seen today for an acute visit per request of Patient to discuss his disposition  He is SNF for Therapy Admitted from 05/18-05/23 for Periprosthetic Fracture of left hip to be managed Conservatively     Patient has a history of PAF, mechanical mitral valve n Coumadin, BPH, previous history of right and left hip arthroplasty, hypertension   Recent Diagnosis of Colon Cancer with Metastatic to upper abdomen on Keytruda now for Palliative treatment. S/P Colectomy   S/p Periprosthetic fracture Doing well walking with mild assist Pain Controlled  Rash due to Keytruda Almost resolved Triamcinolone ointment Appetite still Poor Wt Readings from Last 3 Encounters:  01/07/22 133 lb (60.3 kg)  01/05/22 133 lb (60.3 kg)  12/24/21 136 lb 9.6 oz (62 kg)    Intermittent  Diarrhea  Wants to know if he can go back to his apartment Lost his wife recently  Now by himself. Son lives in [place] in his care      Past Medical History:  Diagnosis Date   Anemia    leakoppenia   BPH (benign prostatic hypertrophy)    Bullous pemphigoid    [name], March 2011, right forearm squamous cell carcinoma   Chronic anticoagulation    systemic   Colon polyp    transverse, 2002   History of peptic ulcer    remote, 3/95   Hx of actinic keratosis    Hx of basal cell carcinoma    Hx of squamous cell carcinoma of skin    Hyperlipidemia    Left inguinal hernia    Moderate aortic insufficiency 2009   audible aortic insufficiency on 1/09 echo   PAF (paroxysmal atrial fibrillation) ([place]) 01/17/2014   On Warfarin.   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 Jude mechanical valve   ORIF FEMUR FRACTURE Left 08/28/2020   Procedure: OPEN REDUCTION INTERNAL FIXATION (ORIF) DISTAL FEMUR FRACTURE;  Surgeon: Rod Can, MD;  Location: [facility];  Service: Orthopedics;  Laterality: Left;   PARTIAL COLECTOMY N/A 06/30/2021   Procedure: PARTIAL COLECTOMY;  Surgeon: Clovis Riley, MD;  Location: [facility];  Service: General;  Laterality: N/A;   TOTAL HIP ARTHROPLASTY Right 10/12/2017   Procedure: RIGHT  TOTAL HIP ARTHROPLASTY ANTERIOR APPROACH;  Surgeon: Gaynelle Arabian, MD;  Location: [facility];  Service: Orthopedics;  Laterality: Right;   TRANSTHORACIC ECHOCARDIOGRAM  12/2018   Unable to assess diastolic function because of A. fib. Normal RV function, but moderately elevated RVSP.  Severe biatrial enlargement. S/P St Jude bileaflet mechanical MVR that appears to be functioning normally. Mitral valve regurgitation cannot assess due to mechanical valve shadowing. MV Mean grad: 7.0 mmHg MV Area (PHT): 3.38 cm (stable for valve).  Mild Ao Sclerosis, Mild-Mod AI   TRANSTHORACIC ECHOCARDIOGRAM  08/'17; 10/'18    a) Mild conc LVH. EF 55-60%. No RWMA. Mod AI. Mechanical MV prosthesis functioning properly. LAD dilation.;; b)  EF 55-60%.  Mo AI.  Bileaflet Saint Jude mechanical MV with no paravalvular leak.  Severe LA dilation.  Minimally elevated PAP    Allergies  Allergen Reactions   Flexeril [Cyclobenzaprine] Diarrhea    Allergies as of 01/07/2022       Reactions   Flexeril [cyclobenzaprine] Diarrhea        Medication List        Accurate as of January 07, 2022  4:04 PM. If you have any questions, ask your nurse or doctor.          acetaminophen 500 MG tablet Commonly known as: TYLENOL Take 1,000 mg by mouth 3 (three) times daily as needed for moderate pain.   bisoprolol 5 MG tablet Commonly known as: ZEBETA Take 1 tablet (5 mg total) by mouth daily.   ferrous sulfate 325 (65 FE) MG tablet Take 1 tablet (325 mg total) by mouth daily with breakfast.   fluticasone 50 MCG/ACT nasal spray Commonly known as: FLONASE Place 1 spray into both nostrils daily as needed for allergies or rhinitis.   furosemide 40 MG tablet Commonly known as: LASIX Take 20-40 mg by mouth See admin instructions. Take 20 mg on Monday Wednesday and Friday and 40 mg on all other days.   isosorbide mononitrate 30 MG 24 hr tablet Commonly known as: IMDUR Take 30 mg by mouth daily.   lactose free nutrition  Liqd Take 237 mLs by mouth daily.   loperamide 2 MG capsule Commonly known as: IMODIUM Take 1 capsule (2 mg total) by mouth as needed for diarrhea or loose stools.   LORazepam 0.5 MG tablet Commonly known as: Ativan Take 1 tablet (0.5 mg total) by mouth daily as needed for anxiety.   MULTIVITAMIN PO Take 1 tablet by mouth daily.   polyethylene glycol 17 g packet Commonly known as: MIRALAX / GLYCOLAX Take 17 g by mouth daily as needed for mild constipation.   PreserVision AREDS 2 Caps Take 1 capsule by mouth daily.   senna 8.6 MG Tabs tablet Commonly known as: SENOKOT Take 2 tablets (17.2 mg total) by mouth at bedtime.   traMADol 50 MG tablet Commonly known as: ULTRAM Take 50 mg by mouth every 8 (eight)  hours as needed.   triamcinolone ointment 0.5 % Commonly known as: KENALOG Apply 1 Application topically 2 (two) times daily.   warfarin 2.5 MG tablet Commonly known as: COUMADIN Take as directed by the anticoagulation clinic. If you are unsure how to take this medication, talk to your nurse or doctor. Original instructions: Take 2.5 mg by mouth. Once A Day on Sun, Tue, Thu, Sat   warfarin 5 MG tablet Commonly known as: COUMADIN Take as directed by the anticoagulation clinic. If you are unsure how to take this medication, talk to your nurse or doctor. Original instructions: Take 5 mg by mouth. Once A Day on Mon, Wed, Fri   zinc oxide 20 % ointment Apply 1 application. topically as needed for irritation.        Review of Systems  Constitutional:  Positive for appetite change. Negative for activity change.  HENT: Negative.    Respiratory:  Negative for cough and shortness of breath.   Cardiovascular:  Positive for leg swelling.  Gastrointestinal:  Negative for constipation.  Genitourinary:  Negative for frequency.  Musculoskeletal:  Positive for gait problem. Negative for arthralgias and myalgias.  Skin: Negative.  Negative for rash.  Neurological:  Negative for  dizziness and weakness.  Psychiatric/Behavioral:  Positive for dysphoric mood. Negative for confusion and sleep disturbance.   All other systems reviewed and are negative.   Immunization History  Administered Date(s) Administered   Influenza Split 02/27/2012, 03/28/2013, 04/10/2014, 05/02/2015, 04/20/2016, 03/28/2017, 03/28/2018, 04/11/2019   Influenza, High Dose Seasonal PF 04/20/2016   Influenza,inj,Quad PF,6+ Mos 03/28/2018   Influenza-Unspecified 03/30/2017, 04/08/2020   Moderna SARS-COV2 Booster Vaccination 12/03/2020   Moderna Sars-Covid-2 Vaccination 07/02/2019, 07/30/2019, 05/12/2020   Pfizer Covid-19 Vaccine Bivalent Booster 54yr & up 04/15/2021   Pneumococcal Conjugate-13 06/10/2014   Pneumococcal Polysaccharide-23 02/02/2006   Pneumococcal-Unspecified 02/02/2006   Td 12/28/2002, 04/11/2014   Tdap 06/10/2014   Zoster, Live 03/29/2015, 11/28/2019, 01/28/2020   Zoster, Unspecified 05/02/2015   Pertinent  Health Maintenance Due  Topic Date Due   INFLUENZA VACCINE  01/26/2022      11/16/2021    1:00 AM 11/16/2021    8:20 AM 11/17/2021   12:00 AM 11/17/2021   10:15 AM 12/11/2021    1:00 PM  Fall Risk  Patient Fall Risk Level High fall risk High fall risk High fall risk High fall risk High fall risk   Functional Status Survey:    Vitals:   01/07/22 1543  BP: (!) 102/55  Pulse: (!) 56  Resp: 16  Temp: (!) 97.5 F (36.4 C)  SpO2: 95%  Weight: 133 lb (60.3 kg)  Height: '5\' 11"'$  (1.803 m)   Body mass index is 18.55 kg/m. Physical Exam Vitals reviewed.  Constitutional:      Appearance: Normal appearance.  HENT:     Head: Normocephalic.     Nose: Nose normal.     Mouth/Throat:     Mouth: Mucous membranes are moist.     Pharynx: Oropharynx is clear.  Eyes:     Pupils: Pupils are equal, round, and reactive to light.  Cardiovascular:     Rate and Rhythm: Normal rate and regular rhythm.     Pulses: Normal pulses.     Heart sounds: Murmur heard.  Pulmonary:      Effort: Pulmonary effort is normal. No respiratory distress.     Breath sounds: Normal breath sounds. No rales.  Abdominal:     General: Abdomen is flat. Bowel sounds are normal.  Palpations: Abdomen is soft.  Musculoskeletal:        General: Swelling present.     Cervical back: Neck supple.     Comments: Right Swelling more then left  Skin:    General: Skin is warm.     Comments: Rash resolved  Neurological:     General: No focal deficit present.     Mental Status: He is alert and oriented to person, place, and time.  Psychiatric:        Mood and Affect: Mood normal.        Thought Content: Thought content normal.     Labs reviewed: Recent Labs    07/06/21 0156 07/06/21 1216 11/13/21 0347 11/14/21 0331 11/16/21 0333 11/17/21 0311 11/20/21 0000 12/11/21 1321 01/01/22 1050  NA  --    < > 134*   < > 136 135 137 131* 130*  K  --    < > 3.8   < > 4.6 4.9 4.3 4.6 4.3  CL  --    < > 103   < > 105 106 105 100 98  CO2  --    < > 26   < > '25 25 22 25 27  '$ GLUCOSE  --    < > 97   < > 126* 97  --  123* 104*  BUN  --    < > 43*   < > 29* 34* 35* 37* 36*  CREATININE  --    < > 1.60*   < > 1.22 1.20 1.1 1.66* 1.36*  CALCIUM  --    < > 8.2*   < > 8.6* 8.6* 8.3* 9.3 9.3  MG  --    < > 2.3  --  2.3 2.2  --   --   --   PHOS 3.0  --   --   --  2.7 2.7  --   --   --    < > = values in this interval not displayed.   Recent Labs    11/17/21 0311 11/20/21 0000 12/11/21 1321 01/01/22 1050  AST '26 29 26 30  '$ ALT '16 17 15 19  '$ ALKPHOS 44 51 98 67  BILITOT 0.8  --  0.8 0.7  PROT 5.9*  --  7.2 7.4  ALBUMIN 2.7* 3.1* 3.6 3.7   Recent Labs    11/17/21 0311 11/20/21 0000 12/11/21 1321 01/01/22 1050  WBC 3.5* 3.5 3.9* 4.2  NEUTROABS 2.2  --  2.8 3.1  HGB 8.2* 8.7* 9.5* 9.7*  HCT 26.3* 27* 28.6* 30.1*  MCV 97.8  --  97.3 99.3  PLT 154 190 186 175   Lab Results  Component Value Date   TSH 4.946 (H) 01/01/2022   No results found for: "HGBA1C" No results found for: "CHOL",  "HDL", "LDLCALC", "LDLDIRECT", "TRIG", "CHOLHDL"  Significant Diagnostic Results in last 30 days:  CT Abdomen Pelvis W Contrast  Result Date: 01/02/2022 CLINICAL DATA:  Assess cancer response, colon cancer. EXAM: CT ABDOMEN AND PELVIS WITH CONTRAST TECHNIQUE: Multidetector CT imaging of the abdomen and pelvis was performed using the standard protocol following bolus administration of intravenous contrast. RADIATION DOSE REDUCTION: This exam was performed according to the departmental dose-optimization program which includes automated exposure control, adjustment of the mA and/or kV according to patient size and/or use of iterative reconstruction technique. CONTRAST:  186m OMNIPAQUE IOHEXOL 300 MG/ML  SOLN COMPARISON:  PET-CT 10/16/2021.  CT abdomen and pelvis 09/17/2021. FINDINGS: Lower chest: Heart is enlarged. No  acute findings in the lung bases. Hepatobiliary: Stable 8 mm hypodense lesion in the left lobe of the liver. No new liver lesions are seen. Gallbladder and bile ducts are within normal limits. Pancreas: Unremarkable. No pancreatic ductal dilatation or surrounding inflammatory changes. Spleen: Normal in size without focal abnormality. Adrenals/Urinary Tract: Bladder not well seen secondary to streak artifact in the pelvis. Kidneys and adrenal glands are within normal limits. There is a hypodensity in the inferior pole the right kidney which is favored as a small cyst. Stomach/Bowel: Right hemicolectomy changes are present. No evidence for bowel obstruction, wall thickening or inflammation. There is a large amount of stool throughout the colon. The stomach appears within normal limits. Vascular/Lymphatic: Aorta and IVC are normal in size. There are atherosclerotic calcifications of the aorta. There is an enlarged gastrohepatic lymph node measuring 6.1 x 6.5 cm. On previous study this appeared to be 3 separate lymph nodes. Overall size has increased. Previously identified retrocaval lymph node has  decreased in size now measuring 1.3 x 2.1 cm image 2/16. Previously identified enlarged retrocaval lymph node at the level of the kidneys has decreased in size now measuring 1.7 x 0.8 cm. No new enlarged lymph nodes are identified. Reproductive: Prostate gland not well evaluated secondary to streak artifact in the pelvis. Other: Left inguinal hernia containing nondilated bowel is again seen. There is no ascites or free air. Musculoskeletal: L1 compression deformity is stable from prior. Sclerosis of T11 vertebral body again noted. Bilateral hip arthroplasties are present. There is a healed left inferior pubic ramus fracture. IMPRESSION: 1. Mixed response to therapy. Enlarging gastrohepatic lymph nodes. Retrocaval lymph nodes are decreasing in size. No new lymphadenopathy or metastatic disease identified. 2. Right hemicolectomy changes. No bowel obstruction. Large stool burden. 3. Left inguinal hernia containing nondilated bowel. 4.  Aortic Atherosclerosis (ICD10-I70.0). Electronically Signed   By: Ronney Asters M.D.   On: 01/02/2022 20:28    Assessment/Plan . Rash Resolved with Triamcinolone   Essential hypertension Hydralazine discontinued due to Low BP On Zebeta   S/P MVR (mitral valve replacement) On Coumadin 2.5-3.5 target   H/O colon cancer, stage III On Immunotherapy for Palliation S/p Colectomy Appetite is Poor Maintaining his weight Also Has diarrhea intermittent  Acute blood loss anemia Hgb stable on Iron   Periprosthetic fracture around internal prosthetic joint, subsequent encounter Doing really well On Tylenol PRN WBAT Discussed about going to [place] before to his apartment as no help and he will be by himself He is going to think about it  Longstanding persistent atrial fibrillation: CHA2DS2-VASc Score 3 Coumadin and Zebeta  Stage 3b chronic kidney disease ([place]) Creat stable CAD On Imdur       Family/ staff Communication:   Labs/tests ordered:

## 2022-01-08 ENCOUNTER — Other Ambulatory Visit: Payer: Self-pay

## 2022-01-08 ENCOUNTER — Inpatient Hospital Stay: Payer: Medicare Other

## 2022-01-08 ENCOUNTER — Encounter: Payer: Self-pay | Admitting: Orthopedic Surgery

## 2022-01-08 ENCOUNTER — Ambulatory Visit: Payer: Medicare Other | Admitting: Hematology

## 2022-01-08 VITALS — BP 129/63 | HR 69 | Temp 98.1°F | Resp 16 | Ht 71.0 in | Wt 122.8 lb

## 2022-01-08 DIAGNOSIS — C184 Malignant neoplasm of transverse colon: Secondary | ICD-10-CM

## 2022-01-08 DIAGNOSIS — R41841 Cognitive communication deficit: Secondary | ICD-10-CM | POA: Diagnosis not present

## 2022-01-08 DIAGNOSIS — Z5112 Encounter for antineoplastic immunotherapy: Secondary | ICD-10-CM | POA: Diagnosis not present

## 2022-01-08 DIAGNOSIS — Z9181 History of falling: Secondary | ICD-10-CM | POA: Diagnosis not present

## 2022-01-08 DIAGNOSIS — M6281 Muscle weakness (generalized): Secondary | ICD-10-CM | POA: Diagnosis not present

## 2022-01-08 DIAGNOSIS — R2681 Unsteadiness on feet: Secondary | ICD-10-CM | POA: Diagnosis not present

## 2022-01-08 DIAGNOSIS — D500 Iron deficiency anemia secondary to blood loss (chronic): Secondary | ICD-10-CM

## 2022-01-08 DIAGNOSIS — Z79899 Other long term (current) drug therapy: Secondary | ICD-10-CM | POA: Diagnosis not present

## 2022-01-08 DIAGNOSIS — M9702XD Periprosthetic fracture around internal prosthetic left hip joint, subsequent encounter: Secondary | ICD-10-CM | POA: Diagnosis not present

## 2022-01-08 DIAGNOSIS — M9702XA Periprosthetic fracture around internal prosthetic left hip joint, initial encounter: Secondary | ICD-10-CM | POA: Diagnosis not present

## 2022-01-08 LAB — CBC WITH DIFFERENTIAL (CANCER CENTER ONLY)
Abs Immature Granulocytes: 0.05 10*3/uL (ref 0.00–0.07)
Basophils Absolute: 0 10*3/uL (ref 0.0–0.1)
Basophils Relative: 1 %
Eosinophils Absolute: 0.2 10*3/uL (ref 0.0–0.5)
Eosinophils Relative: 6 %
HCT: 28.2 % — ABNORMAL LOW (ref 39.0–52.0)
Hemoglobin: 9.4 g/dL — ABNORMAL LOW (ref 13.0–17.0)
Immature Granulocytes: 1 %
Lymphocytes Relative: 10 %
Lymphs Abs: 0.4 10*3/uL — ABNORMAL LOW (ref 0.7–4.0)
MCH: 32.2 pg (ref 26.0–34.0)
MCHC: 33.3 g/dL (ref 30.0–36.0)
MCV: 96.6 fL (ref 80.0–100.0)
Monocytes Absolute: 0.5 10*3/uL (ref 0.1–1.0)
Monocytes Relative: 12 %
Neutro Abs: 3.1 10*3/uL (ref 1.7–7.7)
Neutrophils Relative %: 70 %
Platelet Count: 192 10*3/uL (ref 150–400)
RBC: 2.92 MIL/uL — ABNORMAL LOW (ref 4.22–5.81)
RDW: 16.9 % — ABNORMAL HIGH (ref 11.5–15.5)
WBC Count: 4.4 10*3/uL (ref 4.0–10.5)
nRBC: 0 % (ref 0.0–0.2)

## 2022-01-08 LAB — CMP (CANCER CENTER ONLY)
ALT: 17 U/L (ref 0–44)
AST: 31 U/L (ref 15–41)
Albumin: 3.6 g/dL (ref 3.5–5.0)
Alkaline Phosphatase: 64 U/L (ref 38–126)
Anion gap: 5 (ref 5–15)
BUN: 33 mg/dL — ABNORMAL HIGH (ref 8–23)
CO2: 25 mmol/L (ref 22–32)
Calcium: 9 mg/dL (ref 8.9–10.3)
Chloride: 98 mmol/L (ref 98–111)
Creatinine: 1.3 mg/dL — ABNORMAL HIGH (ref 0.61–1.24)
GFR, Estimated: 51 mL/min — ABNORMAL LOW (ref >60)
Glucose, Bld: 69 mg/dL — ABNORMAL LOW (ref 70–99)
Potassium: 4.3 mmol/L (ref 3.5–5.1)
Sodium: 128 mmol/L — ABNORMAL LOW (ref 135–145)
Total Bilirubin: 0.6 mg/dL (ref 0.3–1.2)
Total Protein: 7.1 g/dL (ref 6.5–8.1)

## 2022-01-08 LAB — TSH: TSH: 6.253 u[IU]/mL — ABNORMAL HIGH (ref 0.350–4.500)

## 2022-01-08 LAB — FERRITIN: Ferritin: 171 ng/mL (ref 24–336)

## 2022-01-08 MED ORDER — SODIUM CHLORIDE 0.9 % IV SOLN: Freq: Once | INTRAVENOUS | Status: AC

## 2022-01-08 MED ORDER — SODIUM CHLORIDE 0.9 % IV SOLN
200.0000 mg | Freq: Once | INTRAVENOUS | Status: AC
  Administered 2022-01-08: 200 mg via INTRAVENOUS
  Filled 2022-01-08: qty 200

## 2022-01-08 NOTE — Progress Notes (Signed)
This encounter was created in error - please disregard.

## 2022-01-08 NOTE — Progress Notes (Deleted)
Location:  [facility] Room Number: N36/A Place of Service:  SNF [phone]) Provider: Yvonna Alanis, NP  Patient Care Team: [name], MD as PCP - General (Internal Medicine) Leonie Man, MD as PCP - Cardiology (Cardiology) [name], MD as Consulting Physician (Hematology)  Extended Emergency Contact Information Primary Emergency Contact: Chrissie Noa of [place] Phone: [phone] Mobile Phone: [phone] Relation: Son  Code Status:  DNR Goals of care: Advanced Directive information    01/08/2022   12:11 PM  Advanced Directives  Does Patient Have a Medical Advance Directive? Yes  Type of Advance Directive Out of facility DNR (pink MOST or yellow form);Living will  Does patient want to make changes to medical advance directive? No - Patient declined  Pre-existing out of facility DNR order (yellow form or pink MOST form) Yellow form placed in chart (order not valid for inpatient use);Pink MOST form placed in chart (order not valid for inpatient use)     Chief Complaint  Patient presents with   Medical Management of Chronic Issues    Routine visit.   Quality Metric Gaps    Discuss the need for Shingrix vaccine, or post pone if patient refuses.     HPI:  Pt is a 88 y.o. male seen today for medical management of chronic diseases.     Past Medical History:  Diagnosis Date   Anemia    leakoppenia   BPH (benign prostatic hypertrophy)    Bullous pemphigoid    [name], March 2011, right forearm squamous cell carcinoma   Chronic anticoagulation    systemic   Colon polyp    transverse, 2002   History of peptic ulcer    remote, 3/95   Hx of actinic keratosis    Hx of basal cell carcinoma    Hx of squamous cell carcinoma of skin    Hyperlipidemia    Left inguinal hernia    Moderate aortic insufficiency 2009   audible aortic insufficiency on 1/09 echo   PAF (paroxysmal atrial fibrillation) ([facility]) 01/17/2014   On  Warfarin.   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 Jude mechanical valve   ORIF FEMUR FRACTURE Left 08/28/2020   Procedure: OPEN REDUCTION INTERNAL FIXATION (ORIF) DISTAL FEMUR FRACTURE;  Surgeon: Rod Can, MD;  Location: [facility];  Service: Orthopedics;  Laterality: Left;   PARTIAL COLECTOMY N/A 06/30/2021   Procedure: PARTIAL COLECTOMY;  Surgeon: Clovis Riley, MD;  Location: [facility];  Service: General;  Laterality: N/A;   TOTAL HIP ARTHROPLASTY Right 10/12/2017   Procedure: RIGHT TOTAL HIP ARTHROPLASTY ANTERIOR APPROACH;  Surgeon: Gaynelle Arabian, MD;  Location: [facility];  Service: Orthopedics;  Laterality: Right;   TRANSTHORACIC ECHOCARDIOGRAM  12/2018   Unable to assess diastolic  function because of A. fib. Normal RV function, but moderately elevated RVSP.  Severe biatrial enlargement. S/P St Jude bileaflet mechanical MVR that appears to be functioning normally. Mitral valve regurgitation cannot assess due to mechanical valve shadowing. MV Mean grad: 7.0 mmHg MV Area (PHT): 3.38 cm (stable for valve).  Mild Ao Sclerosis, Mild-Mod AI   TRANSTHORACIC ECHOCARDIOGRAM  08/'17; 10/'18    a) Mild conc LVH. EF 55-60%. No RWMA. Mod AI. Mechanical MV prosthesis functioning properly. LAD dilation.;; b)  EF 55-60%.  Mo AI.  Bileaflet  Saint Jude mechanical MV with no paravalvular leak.  Severe LA dilation.  Minimally elevated PAP    Allergies  Allergen Reactions   Flexeril [Cyclobenzaprine] Diarrhea    Outpatient Encounter Medications as of 01/08/2022  Medication Sig   acetaminophen (TYLENOL) 500 MG tablet Take 1,000 mg by mouth 3 (three) times daily as needed for moderate pain.   bisoprolol (ZEBETA) 5 MG tablet Take 1 tablet (5 mg total) by mouth daily.   ferrous sulfate 325 (65 FE) MG tablet Take 1 tablet (325 mg total) by mouth daily with breakfast.   fluticasone (FLONASE) 50 MCG/ACT nasal spray Place 1 spray into both nostrils daily as needed for allergies or rhinitis.   furosemide (LASIX) 40 MG tablet Take 20-40 mg by mouth See admin instructions. Take 20 mg on Monday Wednesday and Friday and 40 mg on all other days.   isosorbide mononitrate (IMDUR) 30 MG 24 hr tablet Take 30 mg by mouth daily.   lactose free nutrition (BOOST) LIQD Take 237 mLs by mouth daily.   loperamide (IMODIUM) 2 MG capsule Take 1 capsule (2 mg total) by mouth as needed for diarrhea or loose stools.   LORazepam (ATIVAN) 0.5 MG tablet Take 1 tablet (0.5 mg total) by mouth daily as needed for anxiety.   Multiple Vitamins-Minerals (MULTIVITAMIN PO) Take 1 tablet by mouth daily.   Multiple Vitamins-Minerals (PRESERVISION AREDS 2) CAPS Take 1 capsule by mouth daily.   polyethylene glycol (MIRALAX / GLYCOLAX) 17 g packet Take 17 g by mouth daily as needed for mild constipation.   senna (SENOKOT) 8.6 MG TABS tablet Take 2 tablets (17.2 mg total) by mouth at bedtime.   traMADol (ULTRAM) 50 MG tablet Take 50 mg by mouth every 8 (eight) hours as needed.   triamcinolone ointment (KENALOG) 0.5 % Apply 1 Application topically 2 (two) times daily.   warfarin (COUMADIN) 2.5 MG tablet Take 2.5 mg by mouth. Once A Day on Sun, Tue, Thu, Sat   warfarin (COUMADIN) 5 MG tablet Take 5 mg by mouth. Once A Day on Mon, Wed, Fri   zinc oxide 20 % ointment Apply 1  application. topically as needed for irritation.   No facility-administered encounter medications on file as of 01/08/2022.    Review of Systems  Immunization History  Administered Date(s) Administered   Influenza Split 02/27/2012, 03/28/2013, 04/10/2014, 05/02/2015, 04/20/2016, 03/28/2017, 03/28/2018, 04/11/2019   Influenza, High Dose Seasonal PF 04/20/2016   Influenza,inj,Quad PF,6+ Mos 03/28/2018   Influenza-Unspecified 03/30/2017, 04/08/2020   Moderna SARS-COV2 Booster Vaccination 12/03/2020   Moderna Sars-Covid-2 Vaccination 07/02/2019, 07/30/2019, 05/12/2020   Pfizer Covid-19 Vaccine Bivalent Booster 15yr & up 04/15/2021   Pneumococcal Conjugate-13 06/10/2014   Pneumococcal Polysaccharide-23 02/02/2006   Pneumococcal-Unspecified 02/02/2006   Td 12/28/2002, 04/11/2014   Tdap 06/10/2014   Zoster, Live 03/29/2015, 11/28/2019, 01/28/2020   Zoster, Unspecified 05/02/2015   Pertinent  Health Maintenance Due  Topic Date Due   INFLUENZA VACCINE  01/26/2022  11/16/2021    1:00 AM 11/16/2021    8:20 AM 11/17/2021   12:00 AM 11/17/2021   10:15 AM 12/11/2021    1:00 PM  Fall Risk  Patient Fall Risk Level High fall risk High fall risk High fall risk High fall risk High fall risk   Functional Status Survey:    Vitals:   01/08/22 1203  BP: (!) 102/55  Pulse: (!) 56  Resp: 16  Temp: (!) 97.5 F (36.4 C)  SpO2: 95%  Weight: 133 lb (60.3 kg)  Height: '5\' 11"'$  (1.803 m)   Body mass index is 18.55 kg/m. Physical Exam  Labs reviewed: Recent Labs    07/06/21 0156 07/06/21 1216 11/13/21 0347 11/14/21 0331 11/16/21 0333 11/17/21 0311 11/20/21 0000 12/11/21 1321 01/01/22 1050  NA  --    < > 134*   < > 136 135 137 131* 130*  K  --    < > 3.8   < > 4.6 4.9 4.3 4.6 4.3  CL  --    < > 103   < > 105 106 105 100 98  CO2  --    < > 26   < > '25 25 22 25 27  '$ GLUCOSE  --    < > 97   < > 126* 97  --  123* 104*  BUN  --    < > 43*   < > 29* 34* 35* 37* 36*  CREATININE  --    <  > 1.60*   < > 1.22 1.20 1.1 1.66* 1.36*  CALCIUM  --    < > 8.2*   < > 8.6* 8.6* 8.3* 9.3 9.3  MG  --    < > 2.3  --  2.3 2.2  --   --   --   PHOS 3.0  --   --   --  2.7 2.7  --   --   --    < > = values in this interval not displayed.   Recent Labs    11/17/21 0311 11/20/21 0000 12/11/21 1321 01/01/22 1050  AST '26 29 26 30  '$ ALT '16 17 15 19  '$ ALKPHOS 44 51 98 67  BILITOT 0.8  --  0.8 0.7  PROT 5.9*  --  7.2 7.4  ALBUMIN 2.7* 3.1* 3.6 3.7   Recent Labs    11/17/21 0311 11/20/21 0000 12/11/21 1321 01/01/22 1050  WBC 3.5* 3.5 3.9* 4.2  NEUTROABS 2.2  --  2.8 3.1  HGB 8.2* 8.7* 9.5* 9.7*  HCT 26.3* 27* 28.6* 30.1*  MCV 97.8  --  97.3 99.3  PLT 154 190 186 175   Lab Results  Component Value Date   TSH 4.946 (H) 01/01/2022   No results found for: "HGBA1C" No results found for: "CHOL", "HDL", "LDLCALC", "LDLDIRECT", "TRIG", "CHOLHDL"  Significant Diagnostic Results in last 30 days:  CT Abdomen Pelvis W Contrast  Result Date: 01/02/2022 CLINICAL DATA:  Assess cancer response, colon cancer. EXAM: CT ABDOMEN AND PELVIS WITH CONTRAST TECHNIQUE: Multidetector CT imaging of the abdomen and pelvis was performed using the standard protocol following bolus administration of intravenous contrast. RADIATION DOSE REDUCTION: This exam was performed according to the departmental dose-optimization program which includes automated exposure control, adjustment of the mA and/or kV according to patient size and/or use of iterative reconstruction technique. CONTRAST:  130m OMNIPAQUE IOHEXOL 300 MG/ML  SOLN COMPARISON:  PET-CT 10/16/2021.  CT abdomen and pelvis 09/17/2021. FINDINGS: Lower chest: Heart is enlarged.  No acute findings in the lung bases. Hepatobiliary: Stable 8 mm hypodense lesion in the left lobe of the liver. No new liver lesions are seen. Gallbladder and bile ducts are within normal limits. Pancreas: Unremarkable. No pancreatic ductal dilatation or surrounding inflammatory changes.  Spleen: Normal in size without focal abnormality. Adrenals/Urinary Tract: Bladder not well seen secondary to streak artifact in the pelvis. Kidneys and adrenal glands are within normal limits. There is a hypodensity in the inferior pole the right kidney which is favored as a small cyst. Stomach/Bowel: Right hemicolectomy changes are present. No evidence for bowel obstruction, wall thickening or inflammation. There is a large amount of stool throughout the colon. The stomach appears within normal limits. Vascular/Lymphatic: Aorta and IVC are normal in size. There are atherosclerotic calcifications of the aorta. There is an enlarged gastrohepatic lymph node measuring 6.1 x 6.5 cm. On previous study this appeared to be 3 separate lymph nodes. Overall size has increased. Previously identified retrocaval lymph node has decreased in size now measuring 1.3 x 2.1 cm image 2/16. Previously identified enlarged retrocaval lymph node at the level of the kidneys has decreased in size now measuring 1.7 x 0.8 cm. No new enlarged lymph nodes are identified. Reproductive: Prostate gland not well evaluated secondary to streak artifact in the pelvis. Other: Left inguinal hernia containing nondilated bowel is again seen. There is no ascites or free air. Musculoskeletal: L1 compression deformity is stable from prior. Sclerosis of T11 vertebral body again noted. Bilateral hip arthroplasties are present. There is a healed left inferior pubic ramus fracture. IMPRESSION: 1. Mixed response to therapy. Enlarging gastrohepatic lymph nodes. Retrocaval lymph nodes are decreasing in size. No new lymphadenopathy or metastatic disease identified. 2. Right hemicolectomy changes. No bowel obstruction. Large stool burden. 3. Left inguinal hernia containing nondilated bowel. 4.  Aortic Atherosclerosis (ICD10-I70.0). Electronically Signed   By: Ronney Asters M.D.   On: 01/02/2022 20:28    Assessment/Plan There are no diagnoses linked to this  encounter.   Family/ staff Communication: ***  Labs/tests ordered:  ***

## 2022-01-08 NOTE — Patient Instructions (Signed)
[facility]  Discharge Instructions: Thank you for choosing [facility] to provide your oncology and hematology care.   If you have a lab appointment with the Cancer Center, please go directly to the Cancer Center and check in at the registration area.   Wear comfortable clothing and clothing appropriate for easy access to any Portacath or PICC line.   We strive to give you quality time with your provider. You may need to reschedule your appointment if you arrive late (15 or more minutes).  Arriving late affects you and other patients whose appointments are after yours.  Also, if you miss three or more appointments without notifying the office, you may be dismissed from the clinic at the provider's discretion.      For prescription refill requests, have your pharmacy contact our office and allow 72 hours for refills to be completed.    Today you received the following chemotherapy and/or immunotherapy agents: Keytruda      To help prevent nausea and vomiting after your treatment, we encourage you to take your nausea medication as directed.  BELOW ARE SYMPTOMS THAT SHOULD BE REPORTED IMMEDIATELY: *FEVER GREATER THAN 100.4 F (38 C) OR HIGHER *CHILLS OR SWEATING *NAUSEA AND VOMITING THAT IS NOT CONTROLLED WITH YOUR NAUSEA MEDICATION *UNUSUAL SHORTNESS OF BREATH *UNUSUAL BRUISING OR BLEEDING *URINARY PROBLEMS (pain or burning when urinating, or frequent urination) *BOWEL PROBLEMS (unusual diarrhea, constipation, pain near the anus) TENDERNESS IN MOUTH AND THROAT WITH OR WITHOUT PRESENCE OF ULCERS (sore throat, sores in mouth, or a toothache) UNUSUAL RASH, SWELLING OR PAIN  UNUSUAL VAGINAL DISCHARGE OR ITCHING   Items with * indicate a potential emergency and should be followed up as soon as possible or go to the Emergency Department if any problems should occur.  Please show the CHEMOTHERAPY ALERT CARD or IMMUNOTHERAPY ALERT CARD at check-in to  the Emergency Department and triage nurse.  Should you have questions after your visit or need to cancel or reschedule your appointment, please contact [facility]  Dept: [phone]  and follow the prompts.  Office hours are 8:00 a.m. to 4:30 p.m. Monday - Friday. Please note that voicemails left after 4:00 p.m. may not be returned until the following business day.  We are closed weekends and major holidays. You have access to a nurse at all times for urgent questions. Please call the main number to the clinic Dept: [phone] and follow the prompts.   For any non-urgent questions, you may also contact your provider using MyChart. We now offer e-Visits for anyone 18 and older to request care online for non-urgent symptoms. For details visit [email or web address].   Also download the MyChart app! Go to the app store, search "MyChart", open the app, select [facility], and log in with your MyChart username and password.  Masks are optional in the cancer centers. If you would like for your care team to wear a mask while they are taking care of you, please let them know. For doctor visits, patients may have with them one support person who is at least 86 years old. At this time, visitors are not allowed in the infusion area. 

## 2022-01-09 LAB — T4: T4, Total: 4.8 ug/dL (ref 4.5–12.0)

## 2022-01-11 DIAGNOSIS — M9702XD Periprosthetic fracture around internal prosthetic left hip joint, subsequent encounter: Secondary | ICD-10-CM | POA: Diagnosis not present

## 2022-01-11 DIAGNOSIS — Z9181 History of falling: Secondary | ICD-10-CM | POA: Diagnosis not present

## 2022-01-11 DIAGNOSIS — R2681 Unsteadiness on feet: Secondary | ICD-10-CM | POA: Diagnosis not present

## 2022-01-11 DIAGNOSIS — M9702XA Periprosthetic fracture around internal prosthetic left hip joint, initial encounter: Secondary | ICD-10-CM | POA: Diagnosis not present

## 2022-01-11 DIAGNOSIS — R41841 Cognitive communication deficit: Secondary | ICD-10-CM | POA: Diagnosis not present

## 2022-01-11 DIAGNOSIS — M6281 Muscle weakness (generalized): Secondary | ICD-10-CM | POA: Diagnosis not present

## 2022-01-12 DIAGNOSIS — R2681 Unsteadiness on feet: Secondary | ICD-10-CM | POA: Diagnosis not present

## 2022-01-12 DIAGNOSIS — M6281 Muscle weakness (generalized): Secondary | ICD-10-CM | POA: Diagnosis not present

## 2022-01-12 DIAGNOSIS — Z9181 History of falling: Secondary | ICD-10-CM | POA: Diagnosis not present

## 2022-01-12 DIAGNOSIS — M9702XD Periprosthetic fracture around internal prosthetic left hip joint, subsequent encounter: Secondary | ICD-10-CM | POA: Diagnosis not present

## 2022-01-12 DIAGNOSIS — R41841 Cognitive communication deficit: Secondary | ICD-10-CM | POA: Diagnosis not present

## 2022-01-12 DIAGNOSIS — M9702XA Periprosthetic fracture around internal prosthetic left hip joint, initial encounter: Secondary | ICD-10-CM | POA: Diagnosis not present

## 2022-01-13 DIAGNOSIS — R2681 Unsteadiness on feet: Secondary | ICD-10-CM | POA: Diagnosis not present

## 2022-01-13 DIAGNOSIS — R41841 Cognitive communication deficit: Secondary | ICD-10-CM | POA: Diagnosis not present

## 2022-01-13 DIAGNOSIS — M6281 Muscle weakness (generalized): Secondary | ICD-10-CM | POA: Diagnosis not present

## 2022-01-13 DIAGNOSIS — Z9181 History of falling: Secondary | ICD-10-CM | POA: Diagnosis not present

## 2022-01-13 DIAGNOSIS — M9702XD Periprosthetic fracture around internal prosthetic left hip joint, subsequent encounter: Secondary | ICD-10-CM | POA: Diagnosis not present

## 2022-01-13 DIAGNOSIS — M9702XA Periprosthetic fracture around internal prosthetic left hip joint, initial encounter: Secondary | ICD-10-CM | POA: Diagnosis not present

## 2022-01-14 DIAGNOSIS — R2681 Unsteadiness on feet: Secondary | ICD-10-CM | POA: Diagnosis not present

## 2022-01-14 DIAGNOSIS — Z9181 History of falling: Secondary | ICD-10-CM | POA: Diagnosis not present

## 2022-01-14 DIAGNOSIS — M9702XA Periprosthetic fracture around internal prosthetic left hip joint, initial encounter: Secondary | ICD-10-CM | POA: Diagnosis not present

## 2022-01-14 DIAGNOSIS — R41841 Cognitive communication deficit: Secondary | ICD-10-CM | POA: Diagnosis not present

## 2022-01-14 DIAGNOSIS — M9702XD Periprosthetic fracture around internal prosthetic left hip joint, subsequent encounter: Secondary | ICD-10-CM | POA: Diagnosis not present

## 2022-01-14 DIAGNOSIS — M6281 Muscle weakness (generalized): Secondary | ICD-10-CM | POA: Diagnosis not present

## 2022-01-15 DIAGNOSIS — R2681 Unsteadiness on feet: Secondary | ICD-10-CM | POA: Diagnosis not present

## 2022-01-15 DIAGNOSIS — M6281 Muscle weakness (generalized): Secondary | ICD-10-CM | POA: Diagnosis not present

## 2022-01-15 DIAGNOSIS — M9702XA Periprosthetic fracture around internal prosthetic left hip joint, initial encounter: Secondary | ICD-10-CM | POA: Diagnosis not present

## 2022-01-15 DIAGNOSIS — R41841 Cognitive communication deficit: Secondary | ICD-10-CM | POA: Diagnosis not present

## 2022-01-15 DIAGNOSIS — M9702XD Periprosthetic fracture around internal prosthetic left hip joint, subsequent encounter: Secondary | ICD-10-CM | POA: Diagnosis not present

## 2022-01-15 DIAGNOSIS — Z9181 History of falling: Secondary | ICD-10-CM | POA: Diagnosis not present

## 2022-01-17 ENCOUNTER — Encounter: Payer: Self-pay | Admitting: Hematology

## 2022-01-17 NOTE — Telephone Encounter (Signed)
I called pt's son back and speak with pt and his son. He is recovering overall well from his fracture. I reviewed his recent restaging CT from 7/7 which showed mixed response. He is clinically doing better (abdominal pain resolved) and tolerating treatment well, so I recommend continue Keytruda and repeat scan in 2-3 months. He agrees with the plan. All questions were answered.  [name]  01/04/2022

## 2022-01-18 DIAGNOSIS — M9702XD Periprosthetic fracture around internal prosthetic left hip joint, subsequent encounter: Secondary | ICD-10-CM | POA: Diagnosis not present

## 2022-01-18 DIAGNOSIS — M9702XA Periprosthetic fracture around internal prosthetic left hip joint, initial encounter: Secondary | ICD-10-CM | POA: Diagnosis not present

## 2022-01-18 DIAGNOSIS — M6281 Muscle weakness (generalized): Secondary | ICD-10-CM | POA: Diagnosis not present

## 2022-01-18 DIAGNOSIS — R41841 Cognitive communication deficit: Secondary | ICD-10-CM | POA: Diagnosis not present

## 2022-01-18 DIAGNOSIS — R2681 Unsteadiness on feet: Secondary | ICD-10-CM | POA: Diagnosis not present

## 2022-01-18 DIAGNOSIS — Z9181 History of falling: Secondary | ICD-10-CM | POA: Diagnosis not present

## 2022-01-19 DIAGNOSIS — M9702XA Periprosthetic fracture around internal prosthetic left hip joint, initial encounter: Secondary | ICD-10-CM | POA: Diagnosis not present

## 2022-01-19 DIAGNOSIS — R2681 Unsteadiness on feet: Secondary | ICD-10-CM | POA: Diagnosis not present

## 2022-01-19 DIAGNOSIS — M9702XD Periprosthetic fracture around internal prosthetic left hip joint, subsequent encounter: Secondary | ICD-10-CM | POA: Diagnosis not present

## 2022-01-19 DIAGNOSIS — R41841 Cognitive communication deficit: Secondary | ICD-10-CM | POA: Diagnosis not present

## 2022-01-19 DIAGNOSIS — Z9181 History of falling: Secondary | ICD-10-CM | POA: Diagnosis not present

## 2022-01-19 DIAGNOSIS — M6281 Muscle weakness (generalized): Secondary | ICD-10-CM | POA: Diagnosis not present

## 2022-01-20 ENCOUNTER — Telehealth: Payer: Self-pay | Admitting: Hematology

## 2022-01-20 ENCOUNTER — Other Ambulatory Visit: Payer: Self-pay

## 2022-01-20 DIAGNOSIS — M6281 Muscle weakness (generalized): Secondary | ICD-10-CM | POA: Diagnosis not present

## 2022-01-20 DIAGNOSIS — Z9181 History of falling: Secondary | ICD-10-CM | POA: Diagnosis not present

## 2022-01-20 DIAGNOSIS — R2681 Unsteadiness on feet: Secondary | ICD-10-CM | POA: Diagnosis not present

## 2022-01-20 DIAGNOSIS — R41841 Cognitive communication deficit: Secondary | ICD-10-CM | POA: Diagnosis not present

## 2022-01-20 DIAGNOSIS — M9702XA Periprosthetic fracture around internal prosthetic left hip joint, initial encounter: Secondary | ICD-10-CM | POA: Diagnosis not present

## 2022-01-20 DIAGNOSIS — M9702XD Periprosthetic fracture around internal prosthetic left hip joint, subsequent encounter: Secondary | ICD-10-CM | POA: Diagnosis not present

## 2022-01-20 NOTE — Telephone Encounter (Signed)
.  Called pt per 7/25 inbasket , Patient was unavailable, a message with appt time and date was left with number on file.   

## 2022-01-21 ENCOUNTER — Other Ambulatory Visit: Payer: Self-pay

## 2022-01-21 DIAGNOSIS — M6281 Muscle weakness (generalized): Secondary | ICD-10-CM | POA: Diagnosis not present

## 2022-01-21 DIAGNOSIS — M9702XA Periprosthetic fracture around internal prosthetic left hip joint, initial encounter: Secondary | ICD-10-CM | POA: Diagnosis not present

## 2022-01-21 DIAGNOSIS — R41841 Cognitive communication deficit: Secondary | ICD-10-CM | POA: Diagnosis not present

## 2022-01-21 DIAGNOSIS — Z9181 History of falling: Secondary | ICD-10-CM | POA: Diagnosis not present

## 2022-01-21 DIAGNOSIS — M9702XD Periprosthetic fracture around internal prosthetic left hip joint, subsequent encounter: Secondary | ICD-10-CM | POA: Diagnosis not present

## 2022-01-21 DIAGNOSIS — R2681 Unsteadiness on feet: Secondary | ICD-10-CM | POA: Diagnosis not present

## 2022-01-22 DIAGNOSIS — Z9181 History of falling: Secondary | ICD-10-CM | POA: Diagnosis not present

## 2022-01-22 DIAGNOSIS — M9702XA Periprosthetic fracture around internal prosthetic left hip joint, initial encounter: Secondary | ICD-10-CM | POA: Diagnosis not present

## 2022-01-22 DIAGNOSIS — R41841 Cognitive communication deficit: Secondary | ICD-10-CM | POA: Diagnosis not present

## 2022-01-22 DIAGNOSIS — R2681 Unsteadiness on feet: Secondary | ICD-10-CM | POA: Diagnosis not present

## 2022-01-22 DIAGNOSIS — M9702XD Periprosthetic fracture around internal prosthetic left hip joint, subsequent encounter: Secondary | ICD-10-CM | POA: Diagnosis not present

## 2022-01-22 DIAGNOSIS — M6281 Muscle weakness (generalized): Secondary | ICD-10-CM | POA: Diagnosis not present

## 2022-01-25 DIAGNOSIS — R2681 Unsteadiness on feet: Secondary | ICD-10-CM | POA: Diagnosis not present

## 2022-01-25 DIAGNOSIS — R41841 Cognitive communication deficit: Secondary | ICD-10-CM | POA: Diagnosis not present

## 2022-01-25 DIAGNOSIS — Z9181 History of falling: Secondary | ICD-10-CM | POA: Diagnosis not present

## 2022-01-25 DIAGNOSIS — M9702XA Periprosthetic fracture around internal prosthetic left hip joint, initial encounter: Secondary | ICD-10-CM | POA: Diagnosis not present

## 2022-01-25 DIAGNOSIS — M9702XD Periprosthetic fracture around internal prosthetic left hip joint, subsequent encounter: Secondary | ICD-10-CM | POA: Diagnosis not present

## 2022-01-25 DIAGNOSIS — M6281 Muscle weakness (generalized): Secondary | ICD-10-CM | POA: Diagnosis not present

## 2022-01-26 DIAGNOSIS — S300XXD Contusion of lower back and pelvis, subsequent encounter: Secondary | ICD-10-CM | POA: Diagnosis not present

## 2022-01-26 DIAGNOSIS — M6281 Muscle weakness (generalized): Secondary | ICD-10-CM | POA: Diagnosis not present

## 2022-01-26 DIAGNOSIS — M9702XD Periprosthetic fracture around internal prosthetic left hip joint, subsequent encounter: Secondary | ICD-10-CM | POA: Diagnosis not present

## 2022-01-26 DIAGNOSIS — Z9181 History of falling: Secondary | ICD-10-CM | POA: Diagnosis not present

## 2022-01-26 DIAGNOSIS — R2681 Unsteadiness on feet: Secondary | ICD-10-CM | POA: Diagnosis not present

## 2022-01-27 DIAGNOSIS — Z9181 History of falling: Secondary | ICD-10-CM | POA: Diagnosis not present

## 2022-01-27 DIAGNOSIS — M6281 Muscle weakness (generalized): Secondary | ICD-10-CM | POA: Diagnosis not present

## 2022-01-27 DIAGNOSIS — S300XXD Contusion of lower back and pelvis, subsequent encounter: Secondary | ICD-10-CM | POA: Diagnosis not present

## 2022-01-27 DIAGNOSIS — M9702XD Periprosthetic fracture around internal prosthetic left hip joint, subsequent encounter: Secondary | ICD-10-CM | POA: Diagnosis not present

## 2022-01-27 DIAGNOSIS — R2681 Unsteadiness on feet: Secondary | ICD-10-CM | POA: Diagnosis not present

## 2022-01-28 ENCOUNTER — Other Ambulatory Visit: Payer: Self-pay

## 2022-01-28 ENCOUNTER — Inpatient Hospital Stay: Payer: Medicare Other | Attending: Hematology

## 2022-01-28 ENCOUNTER — Encounter: Payer: Self-pay | Admitting: Hematology

## 2022-01-28 ENCOUNTER — Inpatient Hospital Stay (HOSPITAL_BASED_OUTPATIENT_CLINIC_OR_DEPARTMENT_OTHER): Payer: Medicare Other | Admitting: Hematology

## 2022-01-28 ENCOUNTER — Inpatient Hospital Stay: Payer: Medicare Other

## 2022-01-28 VITALS — BP 124/56 | HR 68 | Temp 98.0°F | Resp 16

## 2022-01-28 VITALS — BP 114/56 | HR 76 | Temp 97.2°F | Resp 19 | Wt 133.4 lb

## 2022-01-28 DIAGNOSIS — M6281 Muscle weakness (generalized): Secondary | ICD-10-CM | POA: Diagnosis not present

## 2022-01-28 DIAGNOSIS — D500 Iron deficiency anemia secondary to blood loss (chronic): Secondary | ICD-10-CM

## 2022-01-28 DIAGNOSIS — C184 Malignant neoplasm of transverse colon: Secondary | ICD-10-CM

## 2022-01-28 DIAGNOSIS — Z79899 Other long term (current) drug therapy: Secondary | ICD-10-CM | POA: Insufficient documentation

## 2022-01-28 DIAGNOSIS — S300XXD Contusion of lower back and pelvis, subsequent encounter: Secondary | ICD-10-CM | POA: Diagnosis not present

## 2022-01-28 DIAGNOSIS — Z5112 Encounter for antineoplastic immunotherapy: Secondary | ICD-10-CM | POA: Insufficient documentation

## 2022-01-28 DIAGNOSIS — M9702XD Periprosthetic fracture around internal prosthetic left hip joint, subsequent encounter: Secondary | ICD-10-CM | POA: Diagnosis not present

## 2022-01-28 DIAGNOSIS — Z9181 History of falling: Secondary | ICD-10-CM | POA: Diagnosis not present

## 2022-01-28 DIAGNOSIS — C772 Secondary and unspecified malignant neoplasm of intra-abdominal lymph nodes: Secondary | ICD-10-CM | POA: Insufficient documentation

## 2022-01-28 DIAGNOSIS — R2681 Unsteadiness on feet: Secondary | ICD-10-CM | POA: Diagnosis not present

## 2022-01-28 LAB — CMP (CANCER CENTER ONLY)
ALT: 21 U/L (ref 0–44)
AST: 42 U/L — ABNORMAL HIGH (ref 15–41)
Albumin: 3.9 g/dL (ref 3.5–5.0)
Alkaline Phosphatase: 61 U/L (ref 38–126)
Anion gap: 7 (ref 5–15)
BUN: 35 mg/dL — ABNORMAL HIGH (ref 8–23)
CO2: 26 mmol/L (ref 22–32)
Calcium: 9 mg/dL (ref 8.9–10.3)
Chloride: 97 mmol/L — ABNORMAL LOW (ref 98–111)
Creatinine: 1.24 mg/dL (ref 0.61–1.24)
GFR, Estimated: 54 mL/min — ABNORMAL LOW (ref >60)
Glucose, Bld: 150 mg/dL — ABNORMAL HIGH (ref 70–99)
Potassium: 3.9 mmol/L (ref 3.5–5.1)
Sodium: 130 mmol/L — ABNORMAL LOW (ref 135–145)
Total Bilirubin: 0.6 mg/dL (ref 0.3–1.2)
Total Protein: 7.5 g/dL (ref 6.5–8.1)

## 2022-01-28 LAB — FERRITIN: Ferritin: 176 ng/mL (ref 24–336)

## 2022-01-28 LAB — CBC WITH DIFFERENTIAL (CANCER CENTER ONLY)
Abs Immature Granulocytes: 0.02 10*3/uL (ref 0.00–0.07)
Basophils Absolute: 0 10*3/uL (ref 0.0–0.1)
Basophils Relative: 1 %
Eosinophils Absolute: 0.1 10*3/uL (ref 0.0–0.5)
Eosinophils Relative: 2 %
HCT: 30.5 % — ABNORMAL LOW (ref 39.0–52.0)
Hemoglobin: 10.3 g/dL — ABNORMAL LOW (ref 13.0–17.0)
Immature Granulocytes: 1 %
Lymphocytes Relative: 6 %
Lymphs Abs: 0.2 10*3/uL — ABNORMAL LOW (ref 0.7–4.0)
MCH: 32 pg (ref 26.0–34.0)
MCHC: 33.8 g/dL (ref 30.0–36.0)
MCV: 94.7 fL (ref 80.0–100.0)
Monocytes Absolute: 0.3 10*3/uL (ref 0.1–1.0)
Monocytes Relative: 7 %
Neutro Abs: 3.4 10*3/uL (ref 1.7–7.7)
Neutrophils Relative %: 83 %
Platelet Count: 173 10*3/uL (ref 150–400)
RBC: 3.22 MIL/uL — ABNORMAL LOW (ref 4.22–5.81)
RDW: 15.1 % (ref 11.5–15.5)
WBC Count: 4.1 10*3/uL (ref 4.0–10.5)
nRBC: 0 % (ref 0.0–0.2)

## 2022-01-28 LAB — TSH: TSH: 6.468 u[IU]/mL — ABNORMAL HIGH (ref 0.350–4.500)

## 2022-01-28 MED ORDER — SODIUM CHLORIDE 0.9 % IV SOLN
200.0000 mg | Freq: Once | INTRAVENOUS | Status: AC
  Administered 2022-01-28: 200 mg via INTRAVENOUS
  Filled 2022-01-28: qty 200

## 2022-01-28 MED ORDER — SODIUM CHLORIDE 0.9 % IV SOLN: Freq: Once | INTRAVENOUS | Status: AC

## 2022-01-28 NOTE — Patient Instructions (Signed)
[facility]  Discharge Instructions: Thank you for choosing [facility] to provide your oncology and hematology care.   If you have a lab appointment with the Cancer Center, please go directly to the Cancer Center and check in at the registration area.   Wear comfortable clothing and clothing appropriate for easy access to any Portacath or PICC line.   We strive to give you quality time with your provider. You may need to reschedule your appointment if you arrive late (15 or more minutes).  Arriving late affects you and other patients whose appointments are after yours.  Also, if you miss three or more appointments without notifying the office, you may be dismissed from the clinic at the provider's discretion.      For prescription refill requests, have your pharmacy contact our office and allow 72 hours for refills to be completed.    Today you received the following chemotherapy and/or immunotherapy agents: Keytruda.       To help prevent nausea and vomiting after your treatment, we encourage you to take your nausea medication as directed.  BELOW ARE SYMPTOMS THAT SHOULD BE REPORTED IMMEDIATELY: *FEVER GREATER THAN 100.4 F (38 C) OR HIGHER *CHILLS OR SWEATING *NAUSEA AND VOMITING THAT IS NOT CONTROLLED WITH YOUR NAUSEA MEDICATION *UNUSUAL SHORTNESS OF BREATH *UNUSUAL BRUISING OR BLEEDING *URINARY PROBLEMS (pain or burning when urinating, or frequent urination) *BOWEL PROBLEMS (unusual diarrhea, constipation, pain near the anus) TENDERNESS IN MOUTH AND THROAT WITH OR WITHOUT PRESENCE OF ULCERS (sore throat, sores in mouth, or a toothache) UNUSUAL RASH, SWELLING OR PAIN  UNUSUAL VAGINAL DISCHARGE OR ITCHING   Items with * indicate a potential emergency and should be followed up as soon as possible or go to the Emergency Department if any problems should occur.  Please show the CHEMOTHERAPY ALERT CARD or IMMUNOTHERAPY ALERT CARD at check-in to  the Emergency Department and triage nurse.  Should you have questions after your visit or need to cancel or reschedule your appointment, please contact [facility]  Dept: [phone]  and follow the prompts.  Office hours are 8:00 a.m. to 4:30 p.m. Monday - Friday. Please note that voicemails left after 4:00 p.m. may not be returned until the following business day.  We are closed weekends and major holidays. You have access to a nurse at all times for urgent questions. Please call the main number to the clinic Dept: [phone] and follow the prompts.   For any non-urgent questions, you may also contact your provider using MyChart. We now offer e-Visits for anyone 18 and older to request care online for non-urgent symptoms. For details visit [email or web address].   Also download the MyChart app! Go to the app store, search "MyChart", open the app, select , and log in with your MyChart username and password.  Masks are optional in the cancer centers. If you would like for your care team to wear a mask while they are taking care of you, please let them know. You may have one support person who is at least 86 years old accompany you for your appointments. 

## 2022-01-28 NOTE — Progress Notes (Signed)
[place]   [phone]) [phone] [phone]) [phone]   Clinic Follow up Note   Patient Care Team: [name], MD as PCP - General (Internal Medicine) Leonie Man, MD as PCP - Cardiology (Cardiology) [name], MD as Consulting Physician (Hematology)  Date of Service:  01/28/2022  CHIEF COMPLAINT: f/u of colon cancer  CURRENT THERAPY:  [facility]ard Russell, starting 10/07/21  ASSESSMENT & PLAN:  DRU PRIMEAU is a 88 y.o. male with   1. Cancer of transverse colon, stage IIIa pT2N1cM0, loss of MLH1 and PMS2, nodal recurrence in 08/2021  -presented to ED on 06/24/21 with hematochezia and rectal bleeding. Work up CT showed mass-like filling defect within mid transverse colon. Inpatient colonoscopy on 06/27/21 under Dr. Watt Climes showed a large partially-obstructing transverse colon mass. Pathology confirmed poorly differentiated adenocarcinoma, MMR abnormal with loss of expression in MLH1 and PMS2, MLH1 hypermethylation positive, this is likely sporadic colon cancer. -partial colectomy on 06/30/21 under Dr. Windle Guard showed 5 cm poorly differentiated adenocarcinoma invading muscularis propria. He had one tumor deposit  -baseline CEA on 09/09/21 slightly elevated to 6.84. -staging PET on 10/16/21 showed: intense multifocal hypermetabolic nodal metastases; no definite activity at colonic anastomosis or distant metastasis. -he began [place] on 10/07/21, held briefly following fall on 11/12/21. -restaging CT AP on 01/01/22 showed mixed response-- enlarging gastrohepatic lymph nodes, decrease in retrocaval lymph nodes, no new lymphadenopathy or metastatic disease. Will continue Keytruda for now and repeat scan in 2-3 months. -labs reviewed, overall stable to improved. Hgb is up to 10.3. Will proceed with [place] today. -repeat CT in sep    2. Social -he lives in [facility] and uses their or our transportation. He lost his wife in early 12/2021. -his son lives out of state but is  involved in his care.   3. Goal of care discussion, DNR -We again discussed the incurable nature of his cancer, and the overall poor prognosis, especially if he does not have good response to chemotherapy or progress on chemo -The patient understands the goal of care is palliative. -he verbally agree to DNR/DNI on 10/07/21     PLAN: -proceed with Keytruda today -lab, f/u, and Keytruda every 3 weeks, will order restaging CT on next visit    No problem-specific Assessment & Plan notes found for this encounter.   SUMMARY OF ONCOLOGIC HISTORY: Oncology History Overview Note   Cancer Staging  Cancer of transverse colon  Staging form: Colon and Rectum, AJCC 8th Edition - Pathologic stage from 06/30/2021: Stage I (pT2, pN0, cM0) - Signed by [name], MD on 07/03/2021    Cancer of transverse colon   06/24/2021 Imaging   EXAM: CTA ABDOMEN AND PELVIS WITHOUT AND WITH CONTRAST  MPRESSION: 1. Avid arterial phase enhancing masslike filling defect within the mid transverse colon is identified. Although conceivably this could represent a large blood clot the diagnosis of exclusion is a primary colonic neoplasm. Further evaluation with colonoscopy is recommended. 2. Extensive atherosclerotic disease noted with stenosis noted at the origin of the celiac artery, SMA, IMA and both renal arteries. 3. 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 4. Large left inguinal hernia contains a nonobstructed loop of large bowel. 5. Aortic Atherosclerosis (ICD10-I70.0).   06/27/2021 Procedure   Colonoscopy, Dr. Watt Climes  Impression: - Likely malignant partially obstructing tumor in the mid transverse colon. Biopsied. - The examination was otherwise normal on direct and retroflexion views. Unable to advance the scope past the tumor   06/27/2021 Initial  Biopsy   FINAL MICROSCOPIC DIAGNOSIS:   A. TRANSVERSE COLON, BIOPSY:  - Poorly differentiated adenocarcinoma, see comment     ADDENDUM:  Mismatch Repair Protein (IHC)  SUMMARY INTERPRETATION: ABNORMAL   IHC EXPRESSION RESULTS   TEST           RESULT  MLH1:          LOSS OF NUCLEAR EXPRESSION  MSH2:          Preserved nuclear expression  MSH6:          Preserved nuclear expression  PMS2:          LOSS OF NUCLEAR EXPRESSION    06/30/2021 Cancer Staging   Staging form: Colon and Rectum, AJCC 8th Edition - Pathologic stage from 06/30/2021: Stage IIIA (pT2, pN1c, cM0) - Signed by [name], MD on 10/07/2021 Stage prefix: Initial diagnosis Total positive nodes: 0 Residual tumor (R): R0 - None   06/30/2021 Definitive Surgery   FINAL MICROSCOPIC DIAGNOSIS:   A. COLON, TRANSVERSE, PARTIAL COLECTOMY:  Poorly differentiated adenocarcinoma invading muscularis propria.  See oncology table for details.    07/02/2021 Imaging   EXAM: CT CHEST WITHOUT CONTRAST  IMPRESSION: 1. No evidence of metastatic disease in the thorax. 2. Trace bilateral pleural effusions and associated atelectasis. 3. Multi chamber cardiomegaly. Coronary artery calcifications. Prominence of the central pulmonary artery suggesting pulmonary arterial hypertension.   07/03/2021 Initial Diagnosis   Cancer of transverse colon ([place])   10/07/2021 -  Chemotherapy   Patient is on Treatment Plan : COLORECTAL Pembrolizumab (200) q21d     10/16/2021 PET scan   IMPRESSION: 1. Intense multifocal hypermetabolic nodal metastases within the gastrohepatic ligament and upper retroperitoneum. This nodal disease appears mildly progressive compared with CT of 4 weeks ago. 2. No definite hypermetabolic activity at the colonic anastomosis as suggested on previous CT. 3. No hepatic or other definite distant metastases identified. Low-level hilar activity bilaterally is nonspecific, but likely reactive. 4. Recent compression deformities at T10 and T11 with mild hypermetabolic activity.      INTERVAL HISTORY:  Richard Spears is here for a follow up of colon  cancer. He was last seen by NP Mendel Ryder on 12/11/21. He presents to the clinic alone. He reports he was brought here by transportation services with [facility]. He tells me his wife passed away about a month ago. He explains she was having some health issues and was in a lot of pain.   All other systems were reviewed with the patient and are negative.  MEDICAL HISTORY:  Past Medical History:  Diagnosis Date   Anemia    leakoppenia   BPH (benign prostatic hypertrophy)    Bullous pemphigoid    [name], March 2011, right forearm squamous cell carcinoma   Chronic anticoagulation    systemic   Colon polyp    transverse, 2002   History of peptic ulcer    remote, 3/95   Hx of actinic keratosis    Hx of basal cell carcinoma    Hx of squamous cell carcinoma of skin    Hyperlipidemia    Left inguinal hernia    Moderate aortic insufficiency 2009   audible aortic insufficiency on 1/09 echo   PAF (paroxysmal atrial fibrillation) ([place]) 01/17/2014   On Warfarin.   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 Jude mechanical valve   ORIF FEMUR FRACTURE Left 08/28/2020   Procedure: OPEN REDUCTION INTERNAL FIXATION (ORIF) DISTAL FEMUR FRACTURE;  Surgeon: Rod Can, MD;  Location: [facility];  Service:  Orthopedics;  Laterality: Left;   PARTIAL COLECTOMY N/A 06/30/2021   Procedure: PARTIAL COLECTOMY;  Surgeon: Clovis Riley, MD;  Location: [facility] [place];  Service: General;  Laterality: N/A;   TOTAL HIP ARTHROPLASTY Right 10/12/2017   Procedure: RIGHT TOTAL HIP ARTHROPLASTY ANTERIOR APPROACH;  Surgeon: Gaynelle Arabian, MD;  Location: [facility];  Service: Orthopedics;  Laterality: Right;   TRANSTHORACIC ECHOCARDIOGRAM  12/2018   Unable to assess diastolic function because of A. fib. Normal RV function, but moderately elevated RVSP.  Severe biatrial enlargement. S/P St Jude bileaflet mechanical MVR that appears to be functioning normally. Mitral valve regurgitation cannot assess due to mechanical valve shadowing. MV Mean grad: 7.0 mmHg MV Area (PHT): 3.38 cm (stable for valve).  Mild Ao Sclerosis, Mild-Mod AI   TRANSTHORACIC ECHOCARDIOGRAM  08/'17; 10/'18    a) Mild conc LVH. EF 55-60%. No RWMA. Mod AI. Mechanical MV prosthesis functioning properly. LAD dilation.;; b)  EF 55-60%.  Mo AI.  Bileaflet Saint Jude mechanical MV with no paravalvular leak.  Severe LA dilation.  Minimally elevated PAP    I have reviewed the social history and family history with the patient and they are unchanged from previous note.  ALLERGIES:  is allergic to flexeril [cyclobenzaprine].  MEDICATIONS:  Current Outpatient Medications  Medication Sig Dispense Refill   acetaminophen (TYLENOL) 500 MG tablet Take 1,000 mg by mouth 3 (three) times daily as needed for moderate pain.     bisoprolol (ZEBETA) 5 MG tablet Take 1 tablet (5 mg total) by mouth daily. 90 tablet 3   ferrous sulfate 325 (65 FE) MG tablet Take 1 tablet (325 mg total) by mouth daily with breakfast.  3   fluticasone (FLONASE) 50 MCG/ACT nasal spray Place 1 spray into both nostrils daily as needed for allergies or rhinitis.     furosemide (LASIX) 40 MG tablet Take 20-40 mg by mouth See admin instructions. Take 20 mg on Monday Wednesday and Friday and 40 mg on all  other days.     isosorbide mononitrate (IMDUR) 30 MG 24 hr tablet Take 30 mg by mouth daily.     lactose free nutrition (BOOST) LIQD Take 237 mLs by mouth daily.     loperamide (IMODIUM) 2 MG capsule Take 1 capsule (2 mg total) by mouth as needed for diarrhea or loose stools. 30 capsule 1   LORazepam (ATIVAN) 0.5 MG tablet Take 1 tablet (0.5 mg total) by mouth daily as needed for anxiety. 30 tablet 0   Multiple Vitamins-Minerals (MULTIVITAMIN PO) Take 1 tablet by mouth daily.     Multiple Vitamins-Minerals (PRESERVISION AREDS 2) CAPS Take 1 capsule by mouth daily. 90 capsule 0   polyethylene glycol (MIRALAX / GLYCOLAX) 17 g packet Take 17  g by mouth daily as needed for mild constipation.     traMADol (ULTRAM) 50 MG tablet Take 50 mg by mouth every 8 (eight) hours as needed.     triamcinolone ointment (KENALOG) 0.5 % Apply 1 Application topically 2 (two) times daily.     warfarin (COUMADIN) 2.5 MG tablet Take 2.5 mg by mouth. Once A Day on Sun, Tue, Thu, Sat     warfarin (COUMADIN) 5 MG tablet Take 5 mg by mouth. Once A Day on Mon, Wed, Fri     zinc oxide 20 % ointment Apply 1 application. topically as needed for irritation.     No current facility-administered medications for this visit.    PHYSICAL EXAMINATION: ECOG PERFORMANCE STATUS: 1 - Symptomatic but completely ambulatory  Vitals:   01/28/22 1039  BP: (!) 114/56  Pulse: 76  Resp: 19  Temp: (!) 97.2 F (36.2 C)  SpO2: 99%   Wt Readings from Last 3 Encounters:  01/28/22 133 lb 7 oz (60.5 kg)  01/08/22 122 lb 12.8 oz (55.7 kg)  01/08/22 133 lb (60.3 kg)     GENERAL:alert, no distress and comfortable SKIN: (+) stable skin ecchymosis and darkening EYES: normal, Conjunctiva are pink and non-injected, sclera clear  NEURO: alert & oriented x 3 with fluent speech  LABORATORY DATA:  I have reviewed the data as listed    Latest Ref Rng & Units 01/28/2022    9:55 AM 01/08/2022    2:15 PM 01/01/2022   10:50 AM  CBC  WBC 4.0 - 10.5  K/uL 4.1  4.4  4.2   Hemoglobin 13.0 - 17.0 g/dL 10.3  9.4  9.7   Hematocrit 39.0 - 52.0 % 30.5  28.2  30.1   Platelets 150 - 400 K/uL 173  192  175         Latest Ref Rng & Units 01/28/2022    9:55 AM 01/08/2022    2:15 PM 01/01/2022   10:50 AM  CMP  Glucose 70 - 99 mg/dL 150  69  104   BUN 8 - 23 mg/dL 35  33  36   Creatinine 0.61 - 1.24 mg/dL 1.24  1.30  1.36   Sodium 135 - 145 mmol/L 130  128  130   Potassium 3.5 - 5.1 mmol/L 3.9  4.3  4.3   Chloride 98 - 111 mmol/L 97  98  98   CO2 22 - 32 mmol/L _0 Calcium 8.9 - 10.3 mg/dL 9.0  9.0  9.3   Total Protein 6.5 - 8.1 g/dL 7.5  7.1  7.4   Total Bilirubin 0.3 - 1.2 mg/dL 0.6  0.6  0.7   Alkaline Phos 38 - 126 U/L 61  64  67   AST 15 - 41 U/L 42  31  30   ALT 0 - 44 U/L _1 RADIOGRAPHIC STUDIES: I have personally reviewed the radiological images as listed and agreed with the findings in the report. No results found.    No orders of the defined types were placed in this encounter.  All questions were answered. The patient knows to call the clinic with any problems, questions or concerns. No barriers to learning was detected. The total time spent in the appointment was 30 minutes.     [name], MD 01/28/2022   I, Wilburn Mylar, am acting as scribe for [name], MD.   I have reviewed  the above documentation for accuracy and completeness, and I agree with the above.

## 2022-01-29 ENCOUNTER — Encounter: Payer: Self-pay | Admitting: Orthopedic Surgery

## 2022-01-29 ENCOUNTER — Non-Acute Institutional Stay (INDEPENDENT_AMBULATORY_CARE_PROVIDER_SITE_OTHER): Payer: Medicare Other | Admitting: Orthopedic Surgery

## 2022-01-29 ENCOUNTER — Telehealth: Payer: Self-pay | Admitting: Hematology

## 2022-01-29 DIAGNOSIS — Z0000 Encounter for general adult medical examination without abnormal findings: Secondary | ICD-10-CM

## 2022-01-29 DIAGNOSIS — Z9181 History of falling: Secondary | ICD-10-CM | POA: Diagnosis not present

## 2022-01-29 DIAGNOSIS — M9702XD Periprosthetic fracture around internal prosthetic left hip joint, subsequent encounter: Secondary | ICD-10-CM | POA: Diagnosis not present

## 2022-01-29 DIAGNOSIS — R2681 Unsteadiness on feet: Secondary | ICD-10-CM | POA: Diagnosis not present

## 2022-01-29 DIAGNOSIS — M6281 Muscle weakness (generalized): Secondary | ICD-10-CM | POA: Diagnosis not present

## 2022-01-29 DIAGNOSIS — S300XXD Contusion of lower back and pelvis, subsequent encounter: Secondary | ICD-10-CM | POA: Diagnosis not present

## 2022-01-29 NOTE — Telephone Encounter (Signed)
Left message with follow-up appointments per 8/3 los.

## 2022-01-29 NOTE — Progress Notes (Deleted)
Location:   [facility] Room Number: 46 Place of Service:  SNF ([phone]) Provider:  Windell Moulding, NP  [name], MD  Patient Care Team: [name], MD as PCP - General (Internal Medicine) Leonie Man, MD as PCP - Cardiology (Cardiology) [name], MD as Consulting Physician (Hematology)  Extended Emergency Contact Information Primary Emergency Contact: Chrissie Noa of [place] Phone: [phone] Mobile Phone: [phone] Relation: Son  Code Status:  DNR Goals of care: Advanced Directive information    01/29/2022    1:23 PM  Advanced Directives  Does Patient Have a Medical Advance Directive? Yes  Type of Advance Directive Living will;Out of facility DNR (pink MOST or yellow form)  Does patient want to make changes to medical advance directive? No - Patient declined  Pre-existing out of facility DNR order (yellow form or pink MOST form) Yellow form placed in chart (order not valid for inpatient use);Pink MOST form placed in chart (order not valid for inpatient use)     Chief Complaint  Patient presents with   Medicare Wellness    Annual wellness visit     HPI:  Pt is a 88 y.o. male seen today for medical management of chronic diseases.     Past Medical History:  Diagnosis Date   Anemia    leakoppenia   BPH (benign prostatic hypertrophy)    Bullous pemphigoid    [name], March 2011, right forearm squamous cell carcinoma   Chronic anticoagulation    systemic   Colon polyp    transverse, 2002   History of peptic ulcer    remote, 3/95   Hx of actinic keratosis    Hx of basal cell carcinoma    Hx of squamous cell carcinoma of skin    Hyperlipidemia    Left inguinal hernia    Moderate aortic insufficiency 2009   audible aortic insufficiency on 1/09 echo   PAF (paroxysmal atrial fibrillation) ([facility]) 01/17/2014   On Warfarin.   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 Jude mechanical valve   ORIF FEMUR FRACTURE Left 08/28/2020   Procedure: OPEN REDUCTION INTERNAL FIXATION (ORIF) DISTAL FEMUR FRACTURE;  Surgeon: Rod Can, MD;  Location: [facility];  Service: Orthopedics;  Laterality: Left;   PARTIAL COLECTOMY N/A 06/30/2021   Procedure: PARTIAL COLECTOMY;  Surgeon: Clovis Riley, MD;  Location: [facility];  Service: General;  Laterality: N/A;   TOTAL HIP ARTHROPLASTY Right 10/12/2017   Procedure: RIGHT TOTAL HIP ARTHROPLASTY ANTERIOR APPROACH;  Surgeon: Gaynelle Arabian, MD;  Location: [facility];  Service: Orthopedics;  Laterality: Right;   TRANSTHORACIC ECHOCARDIOGRAM  12/2018   Unable to assess diastolic function because of A. fib. Normal RV function, but moderately elevated RVSP.  Severe biatrial enlargement.  S/P St Jude bileaflet mechanical MVR that appears to be functioning normally. Mitral valve regurgitation cannot assess due to mechanical valve shadowing. MV Mean grad: 7.0 mmHg MV Area (PHT): 3.38 cm (stable for valve).  Mild Ao Sclerosis, Mild-Mod AI   TRANSTHORACIC ECHOCARDIOGRAM  08/'17; 10/'18    a) Mild conc LVH. EF 55-60%. No RWMA. Mod AI. Mechanical MV prosthesis functioning properly. LAD dilation.;; b)  EF 55-60%.  Mo AI.  Bileaflet Saint Jude mechanical MV with no paravalvular leak.  Severe LA dilation.  Minimally elevated PAP     Allergies  Allergen Reactions   Flexeril [Cyclobenzaprine] Diarrhea    Allergies as of 01/29/2022       Reactions   Flexeril [cyclobenzaprine] Diarrhea        Medication List        Accurate as of January 29, 2022  1:48 PM. If you have any questions, ask your nurse or doctor.          acetaminophen 500 MG tablet Commonly known as: TYLENOL Take 1,000 mg by mouth 3 (three) times daily as needed for moderate pain.   bisoprolol 5 MG tablet Commonly known as: ZEBETA Take 1 tablet (5 mg total) by mouth daily.   ferrous sulfate 325 (65 FE) MG tablet Take 1 tablet (325 mg total) by mouth daily with breakfast.   fluticasone 50 MCG/ACT nasal spray Commonly known as: FLONASE Place 1 spray into both nostrils daily as needed for allergies or rhinitis.   furosemide 40 MG tablet Commonly known as: LASIX Take 40 mg by mouth See admin instructions. Sun, Tue, Thurs, Sat   furosemide 20 MG tablet Commonly known as: LASIX Take 20 mg by mouth. Mon, Wed, Fri   isosorbide mononitrate 30 MG 24 hr tablet Commonly known as: IMDUR Take 30 mg by mouth daily.   lactose free nutrition Liqd Take 237 mLs by mouth daily.   loperamide 2 MG capsule Commonly known as: IMODIUM Take 1 capsule (2 mg total) by mouth as needed for diarrhea or loose stools.   LORazepam 0.5 MG tablet Commonly known as: Ativan Take 1 tablet (0.5 mg total) by mouth daily as needed for anxiety.   MULTIVITAMIN PO Take 1 tablet by mouth daily.   polyethylene glycol 17 g packet Commonly known as: MIRALAX / GLYCOLAX Take 17 g by mouth daily as needed for mild constipation.   PreserVision AREDS 2 Caps Take 1 capsule by mouth daily.   senna 8.6 MG Tabs tablet Commonly known as: SENOKOT Take 2 tablets by mouth daily as needed for mild constipation.   traMADol 50 MG tablet Commonly known as: ULTRAM Take 50 mg by mouth every 8 (eight) hours as needed.   triamcinolone ointment 0.5 % Commonly known as:  KENALOG Apply 1 Application topically 2 (two) times daily.   warfarin 2.5 MG tablet Commonly known as: COUMADIN Take as directed by the anticoagulation clinic. If you are unsure how to take this medication, talk to your nurse or doctor. Original instructions: Take 2.5 mg by mouth. Once A Day on Sun, Tue, Thu, Sat   warfarin 5 MG tablet Commonly known as: COUMADIN Take as directed by the anticoagulation clinic. If you are unsure how to take this medication, talk to your nurse or doctor. Original instructions: Take 5 mg by mouth. Once A Day on Mon, Wed, Fri   zinc oxide 20 % ointment Apply 1 application. topically as needed for irritation.        Review of Systems  Immunization History  Administered Date(s) Administered   Influenza Split 02/27/2012, 03/28/2013, 04/10/2014, 05/02/2015, 04/20/2016, 03/28/2017, 03/28/2018, 04/11/2019   Influenza, High Dose Seasonal PF 04/20/2016   Influenza,inj,Quad PF,6+ Mos 03/28/2018   Influenza-Unspecified 03/30/2017, 04/08/2020   Moderna SARS-COV2 Booster Vaccination 12/03/2020   Moderna Sars-Covid-2 Vaccination 07/02/2019, 07/30/2019, 05/12/2020   Pfizer Covid-19 Vaccine Bivalent Booster 40yr & up 04/15/2021   Pneumococcal Conjugate-13 06/10/2014   Pneumococcal Polysaccharide-23 02/02/2006   Pneumococcal-Unspecified 02/02/2006   Td 12/28/2002, 04/11/2014   Tdap 06/10/2014   Zoster, Live 03/29/2015, 11/28/2019, 01/28/2020   Zoster, Unspecified 05/02/2015   Pertinent  Health Maintenance Due  Topic Date Due   INFLUENZA VACCINE  01/26/2022      11/17/2021   12:00 AM 11/17/2021   10:15 AM 12/11/2021    1:00 PM 01/08/2022    2:40 PM 01/28/2022   11:00 AM  Fall Risk  Patient Fall Risk Level High fall risk High fall risk High fall risk High fall risk High fall risk   Functional Status Survey:    Vitals:   01/29/22 1320  BP: (!) 140/78  Pulse: 70  Resp: 16  Temp: (!) 96.9 F (36.1 C)  SpO2: 90%  Weight: 131 lb (59.4 kg)  Height: 5'  11" (1.803 m)   Body mass index is 18.27 kg/m. Physical Exam  Labs reviewed: Recent Labs    07/06/21 0156 07/06/21 1216 11/13/21 0347 11/14/21 0331 11/16/21 0333 11/17/21 0311 11/20/21 0000 01/01/22 1050 01/08/22 1415 01/28/22 0955  NA  --    < > 134*   < > 136 135   < > 130* 128* 130*  K  --    < > 3.8   < > 4.6 4.9   < > 4.3 4.3 3.9  CL  --    < > 103   < > 105 106   < > 98 98 97*  CO2  --    < > 26   < > 25 25   < > '27 25 26  '$ GLUCOSE  --    < > 97   < > 126* 97   < > 104* 69* 150*  BUN  --    < > 43*   < > 29* 34*   < > 36* 33* 35*  CREATININE  --    < > 1.60*   < > 1.22 1.20   < > 1.36* 1.30* 1.24  CALCIUM  --    < > 8.2*   < > 8.6* 8.6*   < > 9.3 9.0 9.0  MG  --    < > 2.3  --  2.3 2.2  --   --   --   --   PHOS 3.0  --   --   --  2.7 2.7  --   --   --   --    < > = values in this interval not displayed.   Recent Labs    01/01/22 1050 01/08/22 1415 01/28/22 0955  AST 30 31 42*  ALT '19 17 21  '$ ALKPHOS 67 64 61  BILITOT 0.7 0.6 0.6  PROT 7.4 7.1 7.5  ALBUMIN 3.7 3.6 3.9   Recent Labs    01/01/22 1050 01/08/22 1415 01/28/22 0955  WBC 4.2 4.4 4.1  NEUTROABS 3.1 3.1 3.4  HGB 9.7* 9.4* 10.3*  HCT 30.1* 28.2* 30.5*  MCV 99.3 96.6 94.7  PLT 175 192 173   Lab Results  Component Value Date   TSH 6.468 (  H) 01/28/2022   No results found for: "HGBA1C" No results found for: "CHOL", "HDL", "LDLCALC", "LDLDIRECT", "TRIG", "CHOLHDL"  Significant Diagnostic Results in last 30 days:  CT Abdomen Pelvis W Contrast  Result Date: 01/02/2022 CLINICAL DATA:  Assess cancer response, colon cancer. EXAM: CT ABDOMEN AND PELVIS WITH CONTRAST TECHNIQUE: Multidetector CT imaging of the abdomen and pelvis was performed using the standard protocol following bolus administration of intravenous contrast. RADIATION DOSE REDUCTION: This exam was performed according to the departmental dose-optimization program which includes automated exposure control, adjustment of the mA and/or kV  according to patient size and/or use of iterative reconstruction technique. CONTRAST:  145m OMNIPAQUE IOHEXOL 300 MG/ML  SOLN COMPARISON:  PET-CT 10/16/2021.  CT abdomen and pelvis 09/17/2021. FINDINGS: Lower chest: Heart is enlarged. No acute findings in the lung bases. Hepatobiliary: Stable 8 mm hypodense lesion in the left lobe of the liver. No new liver lesions are seen. Gallbladder and bile ducts are within normal limits. Pancreas: Unremarkable. No pancreatic ductal dilatation or surrounding inflammatory changes. Spleen: Normal in size without focal abnormality. Adrenals/Urinary Tract: Bladder not well seen secondary to streak artifact in the pelvis. Kidneys and adrenal glands are within normal limits. There is a hypodensity in the inferior pole the right kidney which is favored as a small cyst. Stomach/Bowel: Right hemicolectomy changes are present. No evidence for bowel obstruction, wall thickening or inflammation. There is a large amount of stool throughout the colon. The stomach appears within normal limits. Vascular/Lymphatic: Aorta and IVC are normal in size. There are atherosclerotic calcifications of the aorta. There is an enlarged gastrohepatic lymph node measuring 6.1 x 6.5 cm. On previous study this appeared to be 3 separate lymph nodes. Overall size has increased. Previously identified retrocaval lymph node has decreased in size now measuring 1.3 x 2.1 cm image 2/16. Previously identified enlarged retrocaval lymph node at the level of the kidneys has decreased in size now measuring 1.7 x 0.8 cm. No new enlarged lymph nodes are identified. Reproductive: Prostate gland not well evaluated secondary to streak artifact in the pelvis. Other: Left inguinal hernia containing nondilated bowel is again seen. There is no ascites or free air. Musculoskeletal: L1 compression deformity is stable from prior. Sclerosis of T11 vertebral body again noted. Bilateral hip arthroplasties are present. There is a healed  left inferior pubic ramus fracture. IMPRESSION: 1. Mixed response to therapy. Enlarging gastrohepatic lymph nodes. Retrocaval lymph nodes are decreasing in size. No new lymphadenopathy or metastatic disease identified. 2. Right hemicolectomy changes. No bowel obstruction. Large stool burden. 3. Left inguinal hernia containing nondilated bowel. 4.  Aortic Atherosclerosis (ICD10-I70.0). Electronically Signed   By: ARonney AstersM.D.   On: 01/02/2022 20:28    Assessment/Plan There are no diagnoses linked to this encounter.   Family/ staff Communication:   Labs/tests ordered:

## 2022-01-29 NOTE — Patient Instructions (Signed)
  [name] , Thank you for taking time to come for your Medicare Wellness Visit. I appreciate your ongoing commitment to your health goals. Please review the following plan we discussed and let me know if I can assist you in the future.   These are the goals we discussed:  Goals      DIET - INCREASE WATER INTAKE     Maintain Mobility and Function     Evidence-based guidance:  Emphasize the importance of physical activity and aerobic exercise as included in treatment plan; assess barriers to adherence; consider patient's abilities and preferences.  Encourage gradual increase in activity or exercise instead of stopping if pain occurs.  Reinforce individual therapy exercise prescription, such as strengthening, stabilization and stretching programs.  Promote optimal body mechanics to stabilize the spine with lifting and functional activity.  Encourage activity and mobility modifications to facilitate optimal function, such as using a log roll for bed mobility or dressing from a seated position.  Reinforce individual adaptive equipment recommendations to limit excessive spinal movements, such as a Systems analyst.  Assess adequacy of sleep; encourage use of sleep hygiene techniques, such as bedtime routine; use of white noise; dark, cool bedroom; avoiding daytime naps, heavy meals or exercise before bedtime.  Promote positions and modification to optimize sleep and sexual activity; consider pillows or positioning devices to assist in maintaining neutral spine.  Explore options for applying ergonomic principles at work and home, such as frequent position changes, using ergonomically designed equipment and working at optimal height.  Promote modifications to increase comfort with driving such as lumbar support, optimizing seat and steering wheel position, using cruise control and taking frequent rest stops to stretch and walk.   Notes:         This is a list of the screening recommended  for you and due dates:  Health Maintenance  Topic Date Due   Zoster (Shingles) Vaccine (1 of 2) 08/14/1944   Flu Shot  01/26/2022   Tetanus Vaccine  06/10/2024   Pneumonia Vaccine  Completed   COVID-19 Vaccine  Completed   HPV Vaccine  Aged Out

## 2022-01-29 NOTE — Progress Notes (Signed)
Subjective:   Richard Spears is a 88 y.o. male who presents for Medicare Annual/Subsequent preventive examination.  Place of Service: [facility]- skilled nursing unit Provider: Windell Moulding, AGNP-C   Review of Systems     Cardiac Risk Factors include: advanced age (>65mn, >>55women);hypertension     Objective:    Today's Vitals   01/29/22 1320 01/29/22 1408  BP: (!) 140/78   Pulse: 70   Resp: 16   Temp: (!) 96.9 F (36.1 C)   SpO2: 90%   Weight: 131 lb (59.4 kg)   Height: '5\' 11"'$  (1.803 m)   PainSc:  0-No pain   Body mass index is 18.27 kg/m.     01/29/2022    1:23 PM 01/08/2022   12:11 PM 01/07/2022    4:03 PM 01/05/2022    4:38 PM 12/24/2021    9:51 AM 12/16/2021    9:45 AM 12/11/2021    1:55 PM  Advanced Directives  Does Patient Have a Medical Advance Directive? Yes Yes Yes Yes Yes Yes Yes  Type of Advance Directive Living will;Out of facility DNR (pink MOST or yellow form) Out of facility DNR (pink MOST or yellow form);Living will Out of facility DNR (pink MOST or yellow form);Living will Out of facility DNR (pink MOST or yellow form);Living will Out of facility DNR (pink MOST or yellow form);Living will Out of facility DNR (pink MOST or yellow form);Living will Living will;Out of facility DNR (pink MOST or yellow form)  Does patient want to make changes to medical advance directive? No - Patient declined No - Patient declined No - Patient declined No - Patient declined No - Patient declined No - Patient declined No - Patient declined  Copy of [place]?       No - copy requested  Would patient like information on creating a medical advance directive?       No - Patient declined  Pre-existing out of facility DNR order (yellow form or pink MOST form) Yellow form placed in chart (order not valid for inpatient use);Pink MOST form placed in chart (order not valid for inpatient use) Yellow form placed in chart (order not valid for inpatient  use);Pink MOST form placed in chart (order not valid for inpatient use) Yellow form placed in chart (order not valid for inpatient use);Pink MOST form placed in chart (order not valid for inpatient use) Yellow form placed in chart (order not valid for inpatient use);Pink MOST form placed in chart (order not valid for inpatient use) Yellow form placed in chart (order not valid for inpatient use);Pink MOST form placed in chart (order not valid for inpatient use) Yellow form placed in chart (order not valid for inpatient use);Pink MOST form placed in chart (order not valid for inpatient use)     Current Medications (verified) Outpatient Encounter Medications as of 01/29/2022  Medication Sig   acetaminophen (TYLENOL) 500 MG tablet Take 1,000 mg by mouth 3 (three) times daily as needed for moderate pain.   bisoprolol (ZEBETA) 5 MG tablet Take 1 tablet (5 mg total) by mouth daily.   ferrous sulfate 325 (65 FE) MG tablet Take 1 tablet (325 mg total) by mouth daily with breakfast.   fluticasone (FLONASE) 50 MCG/ACT nasal spray Place 1 spray into both nostrils daily as needed for allergies or rhinitis.   furosemide (LASIX) 20 MG tablet Take 20 mg by mouth. Mon, Wed, Fri   furosemide (LASIX) 40 MG tablet Take 40  mg by mouth See admin instructions. Sun, Tue, Thurs, Sat   isosorbide mononitrate (IMDUR) 30 MG 24 hr tablet Take 30 mg by mouth daily.   lactose free nutrition (BOOST) LIQD Take 237 mLs by mouth daily.   loperamide (IMODIUM) 2 MG capsule Take 1 capsule (2 mg total) by mouth as needed for diarrhea or loose stools.   LORazepam (ATIVAN) 0.5 MG tablet Take 1 tablet (0.5 mg total) by mouth daily as needed for anxiety.   Multiple Vitamins-Minerals (MULTIVITAMIN PO) Take 1 tablet by mouth daily.   Multiple Vitamins-Minerals (PRESERVISION AREDS 2) CAPS Take 1 capsule by mouth daily.   polyethylene glycol (MIRALAX / GLYCOLAX) 17 g packet Take 17 g by mouth daily as needed for mild constipation.   senna  (SENOKOT) 8.6 MG TABS tablet Take 2 tablets by mouth daily as needed for mild constipation.   traMADol (ULTRAM) 50 MG tablet Take 50 mg by mouth every 8 (eight) hours as needed.   triamcinolone ointment (KENALOG) 0.5 % Apply 1 Application topically 2 (two) times daily.   warfarin (COUMADIN) 2.5 MG tablet Take 2.5 mg by mouth. Once A Day on Sun, Tue, Thu, Sat   warfarin (COUMADIN) 5 MG tablet Take 5 mg by mouth. Once A Day on Mon, Wed, Fri   zinc oxide 20 % ointment Apply 1 application. topically as needed for irritation.   No facility-administered encounter medications on file as of 01/29/2022.    Allergies (verified) Flexeril [cyclobenzaprine]   History: Past Medical History:  Diagnosis Date   Anemia    leakoppenia   BPH (benign prostatic hypertrophy)    Bullous pemphigoid    [name], March 2011, right forearm squamous cell carcinoma   Chronic anticoagulation    systemic   Colon polyp    transverse, 2002   History of peptic ulcer    remote, 3/95   Hx of actinic keratosis    Hx of basal cell carcinoma    Hx of squamous cell carcinoma of skin    Hyperlipidemia    Left inguinal hernia    Moderate aortic insufficiency 2009   audible aortic insufficiency on 1/09 echo   PAF (paroxysmal atrial fibrillation) ([facility]) 01/17/2014   On Warfarin.   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 Jude mechanical valve   ORIF FEMUR FRACTURE Left 08/28/2020   Procedure: OPEN REDUCTION INTERNAL FIXATION (ORIF) DISTAL FEMUR FRACTURE;  Surgeon: Rod Can, MD;  Location: [facility];  Service: Orthopedics;  Laterality: Left;   PARTIAL COLECTOMY N/A 06/30/2021   Procedure: PARTIAL COLECTOMY;  Surgeon: Clovis Riley, MD;  Location: [facility];  Service: General;  Laterality: N/A;   TOTAL HIP ARTHROPLASTY Right 10/12/2017   Procedure: RIGHT TOTAL HIP ARTHROPLASTY ANTERIOR APPROACH;  Surgeon: Gaynelle Arabian, MD;  Location: [facility];  Service: Orthopedics;  Laterality: Right;   TRANSTHORACIC ECHOCARDIOGRAM  12/2018   Unable to assess diastolic function because of A. fib. Normal RV function, but moderately elevated RVSP.  Severe biatrial enlargement. S/P St Jude bileaflet mechanical MVR that appears to be functioning normally. Mitral valve regurgitation cannot assess due to mechanical valve shadowing. MV Mean grad: 7.0 mmHg MV Area (PHT): 3.38 cm (stable for valve).  Mild Ao Sclerosis, Mild-Mod AI   TRANSTHORACIC ECHOCARDIOGRAM  08/'17; 10/'18    a) Mild conc LVH. EF 55-60%. No RWMA. Mod AI. Mechanical MV prosthesis functioning properly. LAD dilation.;; b)  EF 55-60%.  Mo AI.  Bileaflet Saint Jude mechanical MV with no paravalvular leak.  Severe LA dilation.  Minimally elevated PAP   Family History  Problem Relation Age of Onset   Hypertension Mother    Lung cancer Sister    COPD Brother    Cancer Brother    Other Sister        polio   Lupus Son    Social History   Socioeconomic History   Marital status: Married    Spouse name: Not on file   Number of children: 2   Years of education: Not on file   Highest education level: Not on file  Occupational History   Occupation: retired  Tobacco Use   Smoking status: Former    Packs/day: 1.00    Years: 13.00    Total pack years: 13.00    Types: Cigarettes    Start date:  36    Quit date: 1960    Years since quitting: 63.6   Smokeless tobacco: Never  Vaping Use   Vaping Use: Never used  Substance and Sexual Activity   Alcohol use: Yes    Alcohol/week: 0.0 standard drinks of alcohol    Comment: 1-2 drinks per day   Drug use: Never   Sexual activity: Not on file  Other Topics Concern   Not on file  Social History Narrative   Patient lives at [facility], With his wife - Meryl Crutch.   Social Determinants of Health   Financial Resource Strain: Low Risk  (01/29/2022)   Overall Financial Resource Strain (CARDIA)    Difficulty of Paying Living Expenses: Not hard at all  Food Insecurity: No Food Insecurity (01/29/2022)   Hunger Vital Sign    Worried About Running Out of Food in the Last Year: Never true    Ran Out of Food in the Last Year: Never true  Transportation Needs: No Transportation Needs (01/29/2022)   PRAPARE - Hydrologist (Medical): No    Lack of Transportation (Non-Medical): No  Physical Activity: Insufficiently Active (01/29/2022)   Exercise Vital Sign    Days of Exercise per Week: 7 days    Minutes of Exercise per Session: 20 min  Stress: No Stress Concern Present (01/29/2022)   [place]    Feeling of Stress : Only a little  Social Connections: Socially Isolated (01/29/2022)   Social Connection and Isolation Panel [NHANES]    Frequency of Communication with Friends and Family: Three times a week    Frequency of Social Gatherings with Friends and Family: Twice a week    Attends Religious Services: Never    Marine scientist or Organizations: No    Attends Archivist Meetings: Never    Marital Status: Widowed    Tobacco Counseling Counseling given: Not  Answered   Clinical Intake:  Pre-visit preparation completed: No  Pain : No/denies pain Pain Score: 0-No pain     BMI - recorded: 18.27  How often do you need to have  someone help you when you read instructions, pamphlets, or other written materials from your doctor or pharmacy?: 2 - Rarely What is the last grade level you completed in school?: [place]  Diabetic?No  Interpreter Needed?: No      Activities of Daily Living    01/29/2022    2:15 PM 11/12/2021    6:57 PM  In your present state of health, do you have any difficulty performing the following activities:  Hearing? 0   Vision? 0   Difficulty concentrating or making decisions? 0   Walking or climbing stairs? 1   Dressing or bathing? 1   Doing errands, shopping? 1 1  Preparing Food and eating ? Y   Using the Toilet? N   In the past six months, have you accidently leaked urine? N   Do you have problems with loss of bowel control? N   Managing your Medications? Y   Managing your Finances? Y   Housekeeping or managing your Housekeeping? N     Patient Care Team: [name], MD as PCP - General (Internal Medicine) Leonie Man, MD as PCP - Cardiology (Cardiology) [name], MD as Consulting Physician (Hematology)  Indicate any recent Medical Services you may have received from other than [facility] providers in the past year (date may be approximate).     Assessment:   This is a routine wellness examination for [place].  Hearing/Vision screen No results found.  Dietary issues and exercise activities discussed: Current Exercise Habits: The patient does not participate in regular exercise at present, Exercise limited by: None identified;cardiac condition(s);orthopedic condition(s)   Goals Addressed             This Visit's Progress    Maintain Mobility and Function   On track    Evidence-based guidance:  Emphasize the importance of physical activity and aerobic exercise as included in treatment plan; assess barriers to adherence; consider patient's abilities and preferences.  Encourage gradual increase in activity or exercise instead of stopping if pain occurs.  Reinforce  individual therapy exercise prescription, such as strengthening, stabilization and stretching programs.  Promote optimal body mechanics to stabilize the spine with lifting and functional activity.  Encourage activity and mobility modifications to facilitate optimal function, such as using a log roll for bed mobility or dressing from a seated position.  Reinforce individual adaptive equipment recommendations to limit excessive spinal movements, such as a Systems analyst.  Assess adequacy of sleep; encourage use of sleep hygiene techniques, such as bedtime routine; use of white noise; dark, cool bedroom; avoiding daytime naps, heavy meals or exercise before bedtime.  Promote positions and modification to optimize sleep and sexual activity; consider pillows or positioning devices to assist in maintaining neutral spine.  Explore options for applying ergonomic principles at work and home, such as frequent position changes, using ergonomically designed equipment and working at optimal height.  Promote modifications to increase comfort with driving such as lumbar support, optimizing seat and steering wheel position, using cruise control and taking frequent rest stops to stretch and walk.   Notes:        Depression Screen    01/29/2022    2:11 PM 12/24/2020    4:29 PM 12/24/2020   11:48 AM 10/31/2014   12:47 PM  PHQ 2/9 Scores  PHQ - 2 Score 0 0 0 0    Fall Risk    01/29/2022    2:15 PM 12/24/2020    4:28 PM 12/24/2020   11:48 AM  Fall Risk   Falls in the past year? '1 1 1  '$ Number falls in past yr: '1 1 1  '$ Injury with Fall? '1 1 1  '$ Risk for fall due to : History of fall(s);Impaired balance/gait;Impaired mobility History of fall(s);Impaired balance/gait;Impaired mobility   Follow up Falls evaluation completed;Education provided;Falls prevention discussed Falls evaluation completed;Education provided;Falls prevention discussed     FALL RISK PREVENTION PERTAINING TO THE HOME:  Any stairs in or  around the home? No  If so, are there any without handrails? No  Home free of loose throw rugs in walkways, pet beds, electrical cords, etc? Yes  Adequate lighting in your home to reduce risk of falls? Yes   ASSISTIVE DEVICES UTILIZED TO PREVENT FALLS:  Life alert? No  Use of a cane, walker or w/c? Yes  Grab bars in the bathroom? Yes  Shower chair or bench in shower? Yes  Elevated toilet seat or a handicapped toilet? Yes   TIMED UP AND GO:  Was the test performed? No .  Length of time to ambulate 10 feet: N/A sec.   Gait slow and steady with assistive device  Cognitive Function:    01/29/2022    2:16 PM 12/24/2020    4:29 PM  MMSE - Mini Mental State Exam  Not completed:  Refused  Orientation to time 5   Orientation to Place 5   Registration 3   Attention/ Calculation 5   Recall 1   Language- name 2 objects 2   Language- repeat 1   Language- follow 3 step command 2   Language- read & follow direction 1   Write a sentence 0   Copy design 1   Total score 26         01/29/2022    2:19 PM 12/24/2020    4:30 PM  6CIT Screen  What Year? 0 points 0 points  What month? 0 points 0 points  What time?  0 points  Count back from 20  0 points  Months in reverse  0 points  Repeat phrase  0 points  Total Score  0 points    Immunizations Immunization History  Administered Date(s) Administered   Influenza Split 02/27/2012, 03/28/2013, 04/10/2014, 05/02/2015, 04/20/2016, 03/28/2017, 03/28/2018, 04/11/2019   Influenza, High Dose Seasonal PF 04/20/2016   Influenza,inj,Quad PF,6+ Mos 03/28/2018   Influenza-Unspecified 03/30/2017, 04/08/2020   Moderna SARS-COV2 Booster Vaccination 12/03/2020   Moderna Sars-Covid-2 Vaccination 07/02/2019, 07/30/2019, 05/12/2020   Pfizer Covid-19 Vaccine Bivalent Booster 63yr & up 04/15/2021   Pneumococcal Conjugate-13 06/10/2014   Pneumococcal Polysaccharide-23 02/02/2006   Pneumococcal-Unspecified 02/02/2006   Td 12/28/2002, 04/11/2014    Tdap 06/10/2014   Zoster, Live 03/29/2015, 11/28/2019, 01/28/2020   Zoster, Unspecified 05/02/2015    TDAP status: Up to date  Flu Vaccine status: Up to date  Pneumococcal vaccine status: Up to date  Covid-19 vaccine status: Completed vaccines  Qualifies for Shingles Vaccine? Yes   Zostavax completed Yes   Shingrix Completed?: Yes  Screening Tests Health Maintenance  Topic Date Due   Zoster Vaccines- Shingrix (1 of 2) 08/14/1944   INFLUENZA VACCINE  01/26/2022   TETANUS/TDAP  06/10/2024   Pneumonia Vaccine 88 Years old  Completed   COVID-19 Vaccine  Completed   HPV VACCINES  Aged Out    Health Maintenance  Health Maintenance Due  Topic Date Due   Zoster Vaccines- Shingrix (1 of 2) 08/14/1944   INFLUENZA VACCINE  01/26/2022    Colorectal cancer screening: No longer required.   Lung Cancer Screening: (Low Dose CT Chest recommended if Age 58-80 years, 30 pack-year currently smoking OR have quit w/in 15years.) does not qualify. Patient has active colon cancer, receiving treatment  Lung Cancer Screening Referral: No  Additional Screening:  Hepatitis C Screening: does not qualify; Completed   Vision Screening: Recommended annual ophthalmology exams for early detection of glaucoma and other disorders of the eye. Is the patient up to date with their annual eye exam?  No  Who is the provider or what is the name of the office in which the patient attends annual eye exams? Cannot recall If pt is not established with a provider, would they like to be referred to a provider to establish care? No .   Dental Screening: Recommended annual dental exams for proper oral hygiene  Community Resource Referral / Chronic Care Management: CRR required this visit?  No   CCM required this visit?  No      Plan:     I have personally reviewed and noted the following in the patient's chart:   Medical and social history Use of alcohol, tobacco or illicit drugs  Current  medications and supplements including opioid prescriptions. Patient is currently taking opioid prescriptions. Information provided to patient regarding non-opioid alternatives. Patient advised to discuss non-opioid treatment plan with their provider. Functional ability and status Nutritional status Physical activity Advanced directives List of other physicians Hospitalizations, surgeries, and ER visits in previous 12 months Vitals Screenings to include cognitive, depression, and falls Referrals and appointments  In addition, I have reviewed and discussed with patient certain preventive protocols, quality metrics, and best practice recommendations. A written personalized care plan for preventive services as well as general preventive health recommendations were provided to patient.     Yvonna Alanis, NP   01/29/2022   Nurse Notes: Planning to trial AL next week, currently in SNF for PT/OT, currently receiving treatment for colon cancer

## 2022-01-30 ENCOUNTER — Other Ambulatory Visit: Payer: Self-pay

## 2022-01-30 LAB — T4: T4, Total: 5 ug/dL (ref 4.5–12.0)

## 2022-02-01 ENCOUNTER — Other Ambulatory Visit: Payer: Self-pay

## 2022-02-01 ENCOUNTER — Encounter: Payer: Self-pay | Admitting: Orthopedic Surgery

## 2022-02-01 ENCOUNTER — Non-Acute Institutional Stay (SKILLED_NURSING_FACILITY): Payer: Medicare Other | Admitting: Orthopedic Surgery

## 2022-02-01 DIAGNOSIS — F320 Major depressive disorder, single episode, mild: Secondary | ICD-10-CM | POA: Diagnosis not present

## 2022-02-01 DIAGNOSIS — M6281 Muscle weakness (generalized): Secondary | ICD-10-CM | POA: Diagnosis not present

## 2022-02-01 DIAGNOSIS — S300XXD Contusion of lower back and pelvis, subsequent encounter: Secondary | ICD-10-CM | POA: Diagnosis not present

## 2022-02-01 DIAGNOSIS — Z9181 History of falling: Secondary | ICD-10-CM | POA: Diagnosis not present

## 2022-02-01 DIAGNOSIS — M9702XD Periprosthetic fracture around internal prosthetic left hip joint, subsequent encounter: Secondary | ICD-10-CM | POA: Diagnosis not present

## 2022-02-01 DIAGNOSIS — C184 Malignant neoplasm of transverse colon: Secondary | ICD-10-CM | POA: Diagnosis not present

## 2022-02-01 DIAGNOSIS — R630 Anorexia: Secondary | ICD-10-CM | POA: Diagnosis not present

## 2022-02-01 DIAGNOSIS — R2681 Unsteadiness on feet: Secondary | ICD-10-CM | POA: Diagnosis not present

## 2022-02-01 MED ORDER — LORAZEPAM 0.5 MG PO TABS: 0.5000 mg | ORAL_TABLET | Freq: Every day | ORAL | 0 refills | Status: DC

## 2022-02-01 MED ORDER — MIRTAZAPINE 15 MG PO TABS: 7.5000 mg | ORAL_TABLET | Freq: Every day | ORAL | 0 refills | Status: DC

## 2022-02-01 NOTE — Telephone Encounter (Signed)
Refill request received from [facility] for lorazepam 0.'5mg'$  tablet one every night at bedtime.  Medication pended and sent to Windell Moulding, NP for approval.

## 2022-02-01 NOTE — Progress Notes (Signed)
Location:   [facility] [facility] Room Number: 36-A Place of Service:  SNF [phone]) Provider:  Windell Moulding, NP  PCP: [name], MD  Patient Care Team: [name], MD as PCP - General (Internal Medicine) Leonie Man, MD as PCP - Cardiology (Cardiology) [name], MD as Consulting Physician (Hematology)  Extended Emergency Contact Information Primary Emergency Contact: Chrissie Noa of [place] Phone: [phone] Mobile Phone: [phone] Relation: Son  Code Status:  DNR Goals of care: Advanced Directive information    02/01/2022   10:53 AM  Advanced Directives  Does Patient Have a Medical Advance Directive? Yes  Type of Paramedic of [place];Living will;Out of facility DNR (pink MOST or yellow form)  Does patient want to make changes to medical advance directive? No - Patient declined  Copy of [place] in Chart? Yes - validated most recent copy scanned in chart (See row information)     Chief Complaint  Patient presents with   Acute Visit    Depression.    HPI:  Pt is a 88 y.o. male seen today for an acute visit for depression.   2023/01/16 his wife passed away. He reports increased depression and sadness within the past week. Tearful today discussing wife. He reports gathering with friends and family last week and seeing a slide show of pictures. He has had daily periods of sadness since slide show. Son lives out of state, but visits frequently. He talks with son daily on phone. PHQ score 4 today. Denies anxiety or suicidal thoughts.   He is followed by Dr. Burr Medico due to active colon cancer. Receiving Keytruda. 01/01/2022 CT abdomen showed enlarged gastrohepatic lymph nodes and decreased rectocaval lymph nodes, no new lymphadenopathy or metastasis. Continues to have poor appetite. Eating about 75-100% of most meals and Boost daily.   AL trial starts 02/01/2022. No recent falls or injuries.  Ambulates with walker for shirt distances, w/c long.   Recent weights:  08/02- 131 lbs  07/03- 133 lbs  06/01- 136.6 lbs   Past Medical History:  Diagnosis Date   Anemia    leakoppenia   BPH (benign prostatic hypertrophy)    Bullous pemphigoid    [name], March 2011, right forearm squamous cell carcinoma   Chronic anticoagulation    systemic   Colon polyp    transverse, 2002   History of peptic ulcer    remote, 3/95   Hx of actinic keratosis    Hx of basal cell carcinoma    Hx of squamous cell carcinoma of skin    Hyperlipidemia    Left inguinal hernia    Moderate aortic insufficiency 2009   audible aortic insufficiency on 1/09 echo   PAF (paroxysmal atrial fibrillation) ([place]) 01/17/2014   On Warfarin.   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 Jude mechanical valve   ORIF FEMUR FRACTURE Left 08/28/2020   Procedure: OPEN REDUCTION INTERNAL FIXATION (ORIF) DISTAL FEMUR FRACTURE;  Surgeon: Rod Can, MD;  Location: [facility];  Service: Orthopedics;  Laterality: Left;   PARTIAL COLECTOMY N/A 06/30/2021   Procedure: PARTIAL COLECTOMY;  Surgeon: Clovis Riley, MD;  Location: [facility];  Service: General;  Laterality: N/A;   TOTAL HIP ARTHROPLASTY Right 10/12/2017   Procedure: RIGHT TOTAL HIP ARTHROPLASTY  ANTERIOR APPROACH;  Surgeon: Gaynelle Arabian, MD;  Location: [facility];  Service: Orthopedics;  Laterality: Right;   TRANSTHORACIC ECHOCARDIOGRAM  12/2018   Unable to assess diastolic function because of A. fib. Normal RV function, but moderately elevated RVSP.  Severe biatrial enlargement. S/P St Jude bileaflet mechanical MVR that appears to be functioning normally. Mitral valve regurgitation cannot assess due to mechanical valve shadowing. MV Mean grad: 7.0 mmHg MV Area (PHT): 3.38 cm (stable for valve).  Mild Ao Sclerosis, Mild-Mod AI   TRANSTHORACIC ECHOCARDIOGRAM  08/'17; 10/'18    a) Mild conc LVH. EF 55-60%. No RWMA. Mod AI. Mechanical MV prosthesis functioning properly. LAD dilation.;; b)  EF 55-60%.  Mo AI.  Bileaflet Saint Jude mechanical MV with no paravalvular leak.  Severe LA dilation.  Minimally elevated PAP    Allergies  Allergen Reactions   Flexeril [Cyclobenzaprine] Diarrhea    Allergies as of 02/01/2022       Reactions   Flexeril [cyclobenzaprine] Diarrhea        Medication List        Accurate as of February 01, 2022 10:54 AM. If you have any questions, ask your nurse or doctor.          acetaminophen 500 MG tablet Commonly known as: TYLENOL Take 1,000 mg by mouth in the morning and at bedtime.   acetaminophen 500 MG tablet Commonly known as: TYLENOL Take 1,000 mg by mouth 3 (three) times daily as needed.   bisoprolol 5 MG tablet Commonly known as: ZEBETA Take 1 tablet (5 mg total) by mouth daily.   ferrous sulfate 325 (65 FE) MG tablet Take 1 tablet (325 mg total) by mouth daily with breakfast.   fluticasone 50 MCG/ACT nasal spray Commonly known as: FLONASE Place 1 spray into both nostrils daily as needed for allergies or rhinitis.   furosemide 40 MG tablet Commonly known as: LASIX Take 40 mg by mouth 4 (four) times a week. Sun, Tue, Thurs, Sat   furosemide 20 MG tablet Commonly known as: LASIX Take 20 mg by mouth 3 (three) times a week. Mon, Wed,  Fri   isosorbide mononitrate 30 MG 24 hr tablet Commonly known as: IMDUR Take 30 mg by mouth daily.   lactose free nutrition Liqd Take 237 mLs by mouth daily.   loperamide 2 MG capsule Commonly known as: IMODIUM Take 1 capsule (2 mg total) by mouth as needed for diarrhea or loose stools.   LORazepam 0.5 MG tablet Commonly known as: ATIVAN Take 0.5 mg by mouth daily.   LORazepam 0.5 MG tablet Commonly known as: Ativan Take 1 tablet (0.5 mg total) by mouth daily as needed for anxiety.   MULTIVITAMIN PO Take 1 tablet by mouth daily.   polyethylene glycol 17 g packet Commonly known as: MIRALAX / GLYCOLAX Take 17 g by mouth daily as needed for mild constipation.   PreserVision AREDS 2 Caps Take 1 capsule by mouth daily.   senna 8.6 MG  Tabs tablet Commonly known as: SENOKOT Take 2 tablets by mouth daily as needed for mild constipation.   traMADol 50 MG tablet Commonly known as: ULTRAM Take 50 mg by mouth every 8 (eight) hours as needed.   triamcinolone ointment 0.5 % Commonly known as: KENALOG Apply 1 Application topically 2 (two) times daily as needed.   warfarin 2.5 MG tablet Commonly known as: COUMADIN Take as directed by the anticoagulation clinic. If you are unsure how to take this medication, talk to your nurse or doctor. Original instructions: Take 2.5 mg by mouth 4 (four) times a week. Sun, Tue, Thu, Sat   warfarin 5 MG tablet Commonly known as: COUMADIN Take as directed by the anticoagulation clinic. If you are unsure how to take this medication, talk to your nurse or doctor. Original instructions: Take 5 mg by mouth 3 (three) times a week. Once A Day on Mon, Wed, Fri   zinc oxide 20 % ointment Apply 1 application. topically as needed for irritation.        Review of Systems  Constitutional:  Negative for activity change, appetite change, chills, fatigue and fever.  HENT:  Negative for trouble swallowing.   Eyes:  Negative for visual disturbance.   Respiratory:  Negative for cough, shortness of breath and wheezing.   Cardiovascular:  Negative for chest pain and leg swelling.  Gastrointestinal:  Negative for abdominal distention, abdominal pain, blood in stool, constipation, diarrhea, nausea and vomiting.  Genitourinary:  Negative for dysuria, frequency and hematuria.  Musculoskeletal:  Positive for gait problem.  Skin:  Negative for wound.  Neurological:  Positive for weakness. Negative for dizziness and headaches.  Psychiatric/Behavioral:  Positive for dysphoric mood. Negative for confusion, sleep disturbance and suicidal ideas. The patient is not nervous/anxious.     Immunization History  Administered Date(s) Administered   Influenza Split 02/27/2012, 03/28/2013, 04/10/2014, 05/02/2015, 04/20/2016, 03/28/2017, 03/28/2018, 04/11/2019   Influenza, High Dose Seasonal PF 04/20/2016   Influenza,inj,Quad PF,6+ Mos 03/28/2018   Influenza-Unspecified 03/30/2017, 04/08/2020   Moderna SARS-COV2 Booster Vaccination 12/03/2020   Moderna Sars-Covid-2 Vaccination 07/02/2019, 07/30/2019, 05/12/2020   Pfizer Covid-19 Vaccine Bivalent Booster 59yr & up 04/15/2021   Pneumococcal Conjugate-13 06/10/2014   Pneumococcal Polysaccharide-23 02/02/2006   Pneumococcal-Unspecified 02/02/2006   Td 12/28/2002, 04/11/2014   Tdap 06/10/2014   Zoster, Live 03/29/2015, 11/28/2019, 01/28/2020   Zoster, Unspecified 05/02/2015   Pertinent  Health Maintenance Due  Topic Date Due   INFLUENZA VACCINE  01/26/2022      11/17/2021   10:15 AM 12/11/2021    1:00 PM 01/08/2022    2:40 PM 01/28/2022   11:00 AM 01/29/2022    2:15 PM  Fall Risk  Falls in the past year?     1  Was there an injury with Fall?     1  Fall Risk Category Calculator     3  Fall Risk Category     High  Patient Fall Risk Level High fall risk High fall risk High fall risk High fall risk High fall risk  Patient at Risk for Falls Due to     History of fall(s);Impaired balance/gait;Impaired  mobility  Fall risk Follow up     Falls evaluation completed;Education provided;Falls prevention discussed   Functional Status Survey:    Vitals:   02/01/22 1046  BP: (!) 140/78  Pulse: 70  Resp: 16  Temp: (!) 96.9 F (36.1 C)  SpO2: 90%  Weight: 131 lb (59.4 kg)  Height: '5\' 11"'$  (1.803 m)  Body mass index is 18.27 kg/m. Physical Exam Vitals reviewed.  Constitutional:      General: He is not in acute distress.    Appearance: He is underweight.  HENT:     Head: Normocephalic.  Eyes:     General:        Right eye: No discharge.        Left eye: No discharge.  Cardiovascular:     Rate and Rhythm: Normal rate. Rhythm irregular.     Pulses: Normal pulses.     Heart sounds: Normal heart sounds.  Pulmonary:     Effort: Pulmonary effort is normal. No respiratory distress.     Breath sounds: Normal breath sounds. No wheezing.  Abdominal:     General: Bowel sounds are normal. There is no distension.     Palpations: Abdomen is soft.     Tenderness: There is no abdominal tenderness.  Musculoskeletal:     Cervical back: Neck supple.     Right lower leg: No edema.     Left lower leg: No edema.  Skin:    General: Skin is warm and dry.     Capillary Refill: Capillary refill takes less than 2 seconds.  Neurological:     General: No focal deficit present.     Mental Status: He is alert and oriented to person, place, and time.     Motor: Weakness present.     Gait: Gait abnormal.     Comments: Walker/wheelchair  Psychiatric:        Mood and Affect: Mood normal.        Behavior: Behavior normal.     Labs reviewed: Recent Labs    07/06/21 0156 07/06/21 1216 11/13/21 0347 11/14/21 0331 11/16/21 0333 11/17/21 0311 11/20/21 0000 01/01/22 1050 01/08/22 1415 01/28/22 0955  NA  --    < > 134*   < > 136 135   < > 130* 128* 130*  K  --    < > 3.8   < > 4.6 4.9   < > 4.3 4.3 3.9  CL  --    < > 103   < > 105 106   < > 98 98 97*  CO2  --    < > 26   < > 25 25   < > '27 25  26  '$ GLUCOSE  --    < > 97   < > 126* 97   < > 104* 69* 150*  BUN  --    < > 43*   < > 29* 34*   < > 36* 33* 35*  CREATININE  --    < > 1.60*   < > 1.22 1.20   < > 1.36* 1.30* 1.24  CALCIUM  --    < > 8.2*   < > 8.6* 8.6*   < > 9.3 9.0 9.0  MG  --    < > 2.3  --  2.3 2.2  --   --   --   --   PHOS 3.0  --   --   --  2.7 2.7  --   --   --   --    < > = values in this interval not displayed.   Recent Labs    01/01/22 1050 01/08/22 1415 01/28/22 0955  AST 30 31 42*  ALT '19 17 21  '$ ALKPHOS 67 64 61  BILITOT 0.7 0.6 0.6  PROT 7.4 7.1 7.5  ALBUMIN 3.7 3.6  3.9   Recent Labs    01/01/22 1050 01/08/22 1415 01/28/22 0955  WBC 4.2 4.4 4.1  NEUTROABS 3.1 3.1 3.4  HGB 9.7* 9.4* 10.3*  HCT 30.1* 28.2* 30.5*  MCV 99.3 96.6 94.7  PLT 175 192 173   Lab Results  Component Value Date   TSH 6.468 (H) 01/28/2022   No results found for: "HGBA1C" No results found for: "CHOL", "HDL", "LDLCALC", "LDLDIRECT", "TRIG", "CHOLHDL"  Significant Diagnostic Results in last 30 days:  No results found.  Assessment/Plan 1. Current mild episode of major depressive disorder without prior episode [facility]) - wife passed 07/02 - PHQ score 4 today - admits to periods of sadness - start Remeron 7.5 mg po qhs  2. Poor appetite - related to colon cancer and recent passing of wife - see above - cont Boost - cont monthly weight  3. Cancer of transverse colon ([place]) - followed by Dr.Feng - 01/01/2022 CT abdomen showed enlarged gastrohepatic lymph nodes and decreased rectocaval lymph nodes, no new lymphadenopathy or metastasis - receiving Keytruda    Family/ staff Communication: plan discussed with patient and nurse  Labs/tests ordered:  none

## 2022-02-02 DIAGNOSIS — M6281 Muscle weakness (generalized): Secondary | ICD-10-CM | POA: Diagnosis not present

## 2022-02-02 DIAGNOSIS — R2681 Unsteadiness on feet: Secondary | ICD-10-CM | POA: Diagnosis not present

## 2022-02-02 DIAGNOSIS — M9702XD Periprosthetic fracture around internal prosthetic left hip joint, subsequent encounter: Secondary | ICD-10-CM | POA: Diagnosis not present

## 2022-02-02 DIAGNOSIS — S300XXD Contusion of lower back and pelvis, subsequent encounter: Secondary | ICD-10-CM | POA: Diagnosis not present

## 2022-02-02 DIAGNOSIS — Z9181 History of falling: Secondary | ICD-10-CM | POA: Diagnosis not present

## 2022-02-03 DIAGNOSIS — S300XXD Contusion of lower back and pelvis, subsequent encounter: Secondary | ICD-10-CM | POA: Diagnosis not present

## 2022-02-03 DIAGNOSIS — M9702XD Periprosthetic fracture around internal prosthetic left hip joint, subsequent encounter: Secondary | ICD-10-CM | POA: Diagnosis not present

## 2022-02-03 DIAGNOSIS — M6281 Muscle weakness (generalized): Secondary | ICD-10-CM | POA: Diagnosis not present

## 2022-02-03 DIAGNOSIS — Z9181 History of falling: Secondary | ICD-10-CM | POA: Diagnosis not present

## 2022-02-03 DIAGNOSIS — R2681 Unsteadiness on feet: Secondary | ICD-10-CM | POA: Diagnosis not present

## 2022-02-04 DIAGNOSIS — M9702XD Periprosthetic fracture around internal prosthetic left hip joint, subsequent encounter: Secondary | ICD-10-CM | POA: Diagnosis not present

## 2022-02-04 DIAGNOSIS — Z9181 History of falling: Secondary | ICD-10-CM | POA: Diagnosis not present

## 2022-02-04 DIAGNOSIS — M6281 Muscle weakness (generalized): Secondary | ICD-10-CM | POA: Diagnosis not present

## 2022-02-04 DIAGNOSIS — S300XXD Contusion of lower back and pelvis, subsequent encounter: Secondary | ICD-10-CM | POA: Diagnosis not present

## 2022-02-04 DIAGNOSIS — R2681 Unsteadiness on feet: Secondary | ICD-10-CM | POA: Diagnosis not present

## 2022-02-05 DIAGNOSIS — R2681 Unsteadiness on feet: Secondary | ICD-10-CM | POA: Diagnosis not present

## 2022-02-05 DIAGNOSIS — Z9181 History of falling: Secondary | ICD-10-CM | POA: Diagnosis not present

## 2022-02-05 DIAGNOSIS — M9702XD Periprosthetic fracture around internal prosthetic left hip joint, subsequent encounter: Secondary | ICD-10-CM | POA: Diagnosis not present

## 2022-02-05 DIAGNOSIS — M6281 Muscle weakness (generalized): Secondary | ICD-10-CM | POA: Diagnosis not present

## 2022-02-05 DIAGNOSIS — S300XXD Contusion of lower back and pelvis, subsequent encounter: Secondary | ICD-10-CM | POA: Diagnosis not present

## 2022-02-08 DIAGNOSIS — R2681 Unsteadiness on feet: Secondary | ICD-10-CM | POA: Diagnosis not present

## 2022-02-08 DIAGNOSIS — M6281 Muscle weakness (generalized): Secondary | ICD-10-CM | POA: Diagnosis not present

## 2022-02-08 DIAGNOSIS — Z9181 History of falling: Secondary | ICD-10-CM | POA: Diagnosis not present

## 2022-02-08 DIAGNOSIS — M9702XD Periprosthetic fracture around internal prosthetic left hip joint, subsequent encounter: Secondary | ICD-10-CM | POA: Diagnosis not present

## 2022-02-08 DIAGNOSIS — S300XXD Contusion of lower back and pelvis, subsequent encounter: Secondary | ICD-10-CM | POA: Diagnosis not present

## 2022-02-09 DIAGNOSIS — S300XXD Contusion of lower back and pelvis, subsequent encounter: Secondary | ICD-10-CM | POA: Diagnosis not present

## 2022-02-09 DIAGNOSIS — M6281 Muscle weakness (generalized): Secondary | ICD-10-CM | POA: Diagnosis not present

## 2022-02-09 DIAGNOSIS — M9702XD Periprosthetic fracture around internal prosthetic left hip joint, subsequent encounter: Secondary | ICD-10-CM | POA: Diagnosis not present

## 2022-02-09 DIAGNOSIS — Z9181 History of falling: Secondary | ICD-10-CM | POA: Diagnosis not present

## 2022-02-09 DIAGNOSIS — R2681 Unsteadiness on feet: Secondary | ICD-10-CM | POA: Diagnosis not present

## 2022-02-10 ENCOUNTER — Other Ambulatory Visit: Payer: Self-pay | Admitting: Orthopedic Surgery

## 2022-02-10 DIAGNOSIS — S300XXD Contusion of lower back and pelvis, subsequent encounter: Secondary | ICD-10-CM | POA: Diagnosis not present

## 2022-02-10 DIAGNOSIS — M9702XD Periprosthetic fracture around internal prosthetic left hip joint, subsequent encounter: Secondary | ICD-10-CM | POA: Diagnosis not present

## 2022-02-10 DIAGNOSIS — R2681 Unsteadiness on feet: Secondary | ICD-10-CM | POA: Diagnosis not present

## 2022-02-10 DIAGNOSIS — Z9181 History of falling: Secondary | ICD-10-CM | POA: Diagnosis not present

## 2022-02-10 DIAGNOSIS — C184 Malignant neoplasm of transverse colon: Secondary | ICD-10-CM

## 2022-02-10 DIAGNOSIS — M6281 Muscle weakness (generalized): Secondary | ICD-10-CM | POA: Diagnosis not present

## 2022-02-10 MED ORDER — OXYCODONE HCL 5 MG PO TABS: 5.0000 mg | ORAL_TABLET | Freq: Two times a day (BID) | ORAL | 0 refills | Status: DC | PRN

## 2022-02-10 NOTE — Progress Notes (Signed)
Patient requesting to have oxycodone due to intermittent abdominal pain related to colon cancer. Medication was previously discontinued due to non use.

## 2022-02-11 ENCOUNTER — Encounter: Payer: Self-pay | Admitting: Internal Medicine

## 2022-02-11 ENCOUNTER — Non-Acute Institutional Stay: Payer: Medicare Other | Admitting: Internal Medicine

## 2022-02-11 DIAGNOSIS — R339 Retention of urine, unspecified: Secondary | ICD-10-CM

## 2022-02-11 DIAGNOSIS — F320 Major depressive disorder, single episode, mild: Secondary | ICD-10-CM | POA: Diagnosis not present

## 2022-02-11 DIAGNOSIS — Z952 Presence of prosthetic heart valve: Secondary | ICD-10-CM | POA: Diagnosis not present

## 2022-02-11 DIAGNOSIS — M6281 Muscle weakness (generalized): Secondary | ICD-10-CM | POA: Diagnosis not present

## 2022-02-11 DIAGNOSIS — R2681 Unsteadiness on feet: Secondary | ICD-10-CM | POA: Diagnosis not present

## 2022-02-11 DIAGNOSIS — C184 Malignant neoplasm of transverse colon: Secondary | ICD-10-CM | POA: Diagnosis not present

## 2022-02-11 DIAGNOSIS — M9702XD Periprosthetic fracture around internal prosthetic left hip joint, subsequent encounter: Secondary | ICD-10-CM | POA: Diagnosis not present

## 2022-02-11 DIAGNOSIS — S300XXD Contusion of lower back and pelvis, subsequent encounter: Secondary | ICD-10-CM | POA: Diagnosis not present

## 2022-02-11 DIAGNOSIS — Z9181 History of falling: Secondary | ICD-10-CM | POA: Diagnosis not present

## 2022-02-11 NOTE — Progress Notes (Signed)
Location:   [facility] Room Number: 34 Place of Service:  [facility] [phone]) Provider:  Veleta Miners MD   [name], MD  Patient Care Team: [name], MD as PCP - General (Internal Medicine) Leonie Man, MD as PCP - Cardiology (Cardiology) [name], MD as Consulting Physician (Hematology)  Extended Emergency Contact Information Primary Emergency Contact: Chrissie Noa of [place] Phone: [phone] Mobile Phone: [phone] Relation: Son  Code Status:  DNR Goals of care: Advanced Directive information    02/11/2022    9:07 AM  Advanced Directives  Does Patient Have a Medical Advance Directive? Yes  Type of Paramedic of [place];Living will;Out of facility DNR (pink MOST or yellow form)  Does patient want to make changes to medical advance directive? No - Patient declined  Copy of [place] in Chart? Yes - validated most recent copy scanned in chart (See row information)     Chief Complaint  Patient presents with   Acute Visit    HPI:  Pt is a 88 y.o. male seen today for an acute visit for Cannot empty Bladder and Feeling weak  Patient was in SNF for therapy after sustaining periprostatic fracture of left hip management Conservatively He was discharged to [facility] yesterday Since being there patient has been complaining of not able to empty his bladder properly.  He has seen urology in the past and has been told to do in and out cath which patient had refused before.  He continues to be urinary incontinent.  And is wearing pull-ups.   Per nurses he is not able to change himself needing a lot of help.  He is also staying in the bed complaining of feeling weak.  Was still sleeping when I went to see him at around 11 in the morning. Denies any fever or chills no dysuria.  Abdominal discomfort in the lower part of his abdomen.  Denies any constipation or nausea vomiting  Patient has a  history of PAF, mechanical mitral valve n Coumadin, BPH, previous history of right and left hip arthroplasty, hypertension   Recent Diagnosis of Colon Cancer with Metastatic to upper abdomen on Keytruda now for Palliative treatment. S/P Colectomy  Past Medical History:  Diagnosis Date   Anemia    leakoppenia   BPH (benign prostatic hypertrophy)    Bullous pemphigoid    [name], March 2011, right forearm squamous cell carcinoma   Chronic anticoagulation    systemic   Colon polyp    transverse, 2002   History of peptic ulcer    remote, 3/95   Hx of actinic keratosis    Hx of basal cell carcinoma    Hx of squamous cell carcinoma of skin    Hyperlipidemia    Left inguinal hernia    Moderate aortic insufficiency 2009   audible aortic insufficiency on 1/09 echo   PAF (paroxysmal atrial fibrillation) ([place]) 01/17/2014   On Warfarin.   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 Jude mechanical valve   ORIF FEMUR FRACTURE Left 08/28/2020   Procedure: OPEN REDUCTION INTERNAL FIXATION (ORIF) DISTAL FEMUR FRACTURE;  Surgeon: Rod Can, MD;  Location: [facility];  Service: Orthopedics;  Laterality: Left;   PARTIAL COLECTOMY N/A 06/30/2021   Procedure: PARTIAL COLECTOMY;  Surgeon: Clovis Riley, MD;  Location: [facility];   Service: General;  Laterality: N/A;   TOTAL HIP ARTHROPLASTY Right 10/12/2017   Procedure: RIGHT TOTAL HIP ARTHROPLASTY ANTERIOR APPROACH;  Surgeon: Gaynelle Arabian, MD;  Location: [facility];  Service: Orthopedics;  Laterality: Right;   TRANSTHORACIC ECHOCARDIOGRAM  12/2018   Unable to assess diastolic function because of A. fib. Normal RV function, but moderately elevated RVSP.  Severe biatrial enlargement. S/P St Jude bileaflet mechanical MVR that appears to be functioning normally. Mitral valve regurgitation cannot assess due to mechanical valve shadowing. MV Mean grad: 7.0 mmHg MV Area (PHT): 3.38 cm (stable for valve).  Mild Ao Sclerosis, Mild-Mod AI   TRANSTHORACIC ECHOCARDIOGRAM  08/'17; 10/'18    a) Mild conc LVH. EF 55-60%. No RWMA. Mod AI. Mechanical MV prosthesis functioning properly. LAD dilation.;; b)  EF 55-60%.  Mo AI.  Bileaflet Saint Jude mechanical MV with no paravalvular leak.  Severe LA dilation.  Minimally elevated PAP    Allergies  Allergen Reactions   Flexeril [Cyclobenzaprine] Diarrhea    Allergies as of 02/11/2022       Reactions   Flexeril [cyclobenzaprine] Diarrhea        Medication List        Accurate as of February 11, 2022  9:07 AM. If you have any questions, ask your nurse or doctor.          STOP taking these medications    fluticasone 50 MCG/ACT nasal spray Commonly known as: FLONASE Stopped by: Virgie Dad, MD   loperamide 2 MG capsule Commonly known as: IMODIUM Stopped by: Virgie Dad, MD   mirtazapine 15 MG tablet Commonly known as: Remeron Stopped by: Virgie Dad, MD       TAKE these medications    acetaminophen 500 MG tablet Commonly known as: TYLENOL Take 1,000 mg by mouth in the morning and at bedtime.   acetaminophen 500 MG tablet Commonly known as: TYLENOL Take 1,000 mg by mouth 3 (three) times daily as needed.   alum & mag hydroxide-simeth 200-200-20 MG/5ML suspension Commonly known as: MAALOX/MYLANTA Take by  mouth every 4 (four) hours as needed for indigestion or heartburn.   bisoprolol 5 MG tablet Commonly known as: ZEBETA Take 1 tablet (5 mg total) by mouth daily.   ferrous sulfate 325 (65 FE) MG tablet Take 1 tablet (325 mg total) by mouth daily with breakfast.   furosemide 40 MG tablet Commonly known as: LASIX Take 40 mg by mouth 4 (four) times a week. Sun, Tue, Thurs, Sat   furosemide 20 MG tablet Commonly known as: LASIX Take 20 mg by mouth 3 (three) times a week. Mon, Wed, Fri   isosorbide mononitrate 30 MG 24 hr tablet Commonly known as: IMDUR Take 30 mg by mouth daily.   lactose free nutrition Liqd Take 237 mLs by mouth daily.   LORazepam 0.5 MG tablet Commonly known as: ATIVAN Take 1 tablet (0.5  mg total) by mouth daily. What changed: Another medication with the same name was removed. Continue taking this medication, and follow the directions you see here. Changed by: Virgie Dad, MD   MULTIVITAMIN PO Take 1 tablet by mouth daily.   omeprazole 20 MG capsule Commonly known as: PRILOSEC Take 20 mg by mouth daily.   oxyCODONE 5 MG immediate release tablet Commonly known as: Roxicodone Take 1 tablet (5 mg total) by mouth 2 (two) times daily as needed for severe pain or breakthrough pain.   polyethylene glycol 17 g packet Commonly known as: MIRALAX / GLYCOLAX Take 17 g by mouth daily as needed for mild constipation.   PreserVision AREDS 2 Caps Take 1 capsule by mouth daily.   senna 8.6 MG Tabs tablet Commonly known as: SENOKOT Take 2 tablets by mouth daily as needed for mild constipation.   triamcinolone ointment 0.5 % Commonly known as: KENALOG Apply 1 Application topically 2 (two) times daily as needed.   warfarin 2.5 MG tablet Commonly known as: COUMADIN Take as directed by the anticoagulation clinic. If you are unsure how to take this medication, talk to your nurse or doctor. Original instructions: Take 2.5 mg by mouth 4 (four) times a week. Sun,  Tue, Thu, Sat   warfarin 5 MG tablet Commonly known as: COUMADIN Take as directed by the anticoagulation clinic. If you are unsure how to take this medication, talk to your nurse or doctor. Original instructions: Take 5 mg by mouth 3 (three) times a week. Once A Day on Mon, Wed, Fri   zinc oxide 20 % ointment Apply 1 application. topically as needed for irritation.        Review of Systems  Constitutional:  Positive for activity change. Negative for appetite change and unexpected weight change.  HENT: Negative.    Respiratory:  Negative for cough and shortness of breath.   Cardiovascular:  Negative for leg swelling.  Gastrointestinal:  Positive for abdominal distention. Negative for constipation.  Genitourinary:  Positive for flank pain and frequency.  Musculoskeletal:  Positive for gait problem. Negative for arthralgias and myalgias.  Skin: Negative.  Negative for rash.  Neurological:  Positive for weakness. Negative for dizziness.  Psychiatric/Behavioral:  Positive for dysphoric mood. Negative for confusion and sleep disturbance.   All other systems reviewed and are negative.   Immunization History  Administered Date(s) Administered   Influenza Split 02/27/2012, 03/28/2013, 04/10/2014, 05/02/2015, 04/20/2016, 03/28/2017, 03/28/2018, 04/11/2019   Influenza, High Dose Seasonal PF 04/20/2016   Influenza,inj,Quad PF,6+ Mos 03/28/2018   Influenza-Unspecified 03/30/2017, 04/08/2020   Moderna SARS-COV2 Booster Vaccination 12/03/2020   Moderna Sars-Covid-2 Vaccination 07/02/2019, 07/30/2019, 05/12/2020   Pfizer Covid-19 Vaccine Bivalent Booster 83yr & up 04/15/2021   Pneumococcal Conjugate-13 06/10/2014   Pneumococcal Polysaccharide-23 02/02/2006   Pneumococcal-Unspecified 02/02/2006   Td 12/28/2002, 04/11/2014   Tdap 06/10/2014   Zoster, Live 03/29/2015, 11/28/2019, 01/28/2020   Zoster, Unspecified 05/02/2015   Pertinent  Health Maintenance Due  Topic Date Due   INFLUENZA  VACCINE  01/26/2022      11/17/2021   10:15 AM 12/11/2021    1:00 PM 01/08/2022    2:40 PM 01/28/2022   11:00 AM 01/29/2022    2:15 PM  Fall Risk  Falls in the past year?     1  Was there an injury with Fall?     1  Fall Risk Category Calculator     3  Fall Risk Category     High  Patient Fall Risk  Level High fall risk High fall risk High fall risk High fall risk High fall risk  Patient at Risk for Falls Due to     History of fall(s);Impaired balance/gait;Impaired mobility  Fall risk Follow up     Falls evaluation completed;Education provided;Falls prevention discussed   Functional Status Survey:    Vitals:   02/11/22 0858  BP: 126/64  Pulse: 68  Resp: 18  Temp: (!) 97.4 F (36.3 C)  SpO2: 96%  Weight: 133 lb (60.3 kg)  Height: '5\' 11"'$  (1.803 m)   Body mass index is 18.55 kg/m. Physical Exam Vitals reviewed.  Constitutional:      Appearance: Normal appearance.     Comments: Looks withdrawn and depressed  HENT:     Head: Normocephalic.     Nose: Nose normal.     Mouth/Throat:     Mouth: Mucous membranes are moist.     Pharynx: Oropharynx is clear.  Eyes:     Pupils: Pupils are equal, round, and reactive to light.  Cardiovascular:     Rate and Rhythm: Normal rate and regular rhythm.     Pulses: Normal pulses.     Heart sounds: Murmur heard.  Pulmonary:     Effort: Pulmonary effort is normal. No respiratory distress.     Breath sounds: Normal breath sounds. No rales.  Abdominal:     General: Abdomen is flat. Bowel sounds are normal.     Palpations: Abdomen is soft.     Comments: Mild Discomfort in lower part of his abdomen  Musculoskeletal:        General: No swelling.     Cervical back: Neck supple.     Comments: Chronic Changes in LE  Skin:    General: Skin is warm.  Neurological:     General: No focal deficit present.     Mental Status: He is alert and oriented to person, place, and time.  Psychiatric:        Mood and Affect: Mood normal.        Thought  Content: Thought content normal.     Labs reviewed: Recent Labs    07/06/21 0156 07/06/21 1216 11/13/21 0347 11/14/21 0331 11/16/21 0333 11/17/21 0311 11/20/21 0000 01/01/22 1050 01/08/22 1415 01/28/22 0955  NA  --    < > 134*   < > 136 135   < > 130* 128* 130*  K  --    < > 3.8   < > 4.6 4.9   < > 4.3 4.3 3.9  CL  --    < > 103   < > 105 106   < > 98 98 97*  CO2  --    < > 26   < > 25 25   < > '27 25 26  '$ GLUCOSE  --    < > 97   < > 126* 97   < > 104* 69* 150*  BUN  --    < > 43*   < > 29* 34*   < > 36* 33* 35*  CREATININE  --    < > 1.60*   < > 1.22 1.20   < > 1.36* 1.30* 1.24  CALCIUM  --    < > 8.2*   < > 8.6* 8.6*   < > 9.3 9.0 9.0  MG  --    < > 2.3  --  2.3 2.2  --   --   --   --   PHOS  3.0  --   --   --  2.7 2.7  --   --   --   --    < > = values in this interval not displayed.   Recent Labs    01/01/22 1050 01/08/22 1415 01/28/22 0955  AST 30 31 42*  ALT '19 17 21  '$ ALKPHOS 67 64 61  BILITOT 0.7 0.6 0.6  PROT 7.4 7.1 7.5  ALBUMIN 3.7 3.6 3.9   Recent Labs    01/01/22 1050 01/08/22 1415 01/28/22 0955  WBC 4.2 4.4 4.1  NEUTROABS 3.1 3.1 3.4  HGB 9.7* 9.4* 10.3*  HCT 30.1* 28.2* 30.5*  MCV 99.3 96.6 94.7  PLT 175 192 173   Lab Results  Component Value Date   TSH 6.468 (H) 01/28/2022   No results found for: "HGBA1C" No results found for: "CHOL", "HDL", "LDLCALC", "LDLDIRECT", "TRIG", "CHOLHDL"  Significant Diagnostic Results in last 30 days:  No results found.  Assessment/Plan  1. Urinary retention In and Out Cath Done got 300cc Will Send for UA and Culture Will Continue to do cath as needed Urology follow up written  But patient already refusing to go  2. S/P MVR (mitral valve replacement) Coumadin dose recently adjusted as INR was high Repeat INR pending  3. Current mild episode of major depressive disorder without prior episode [facility]) Patient seemed depressed and Worried now that his support is less in [place] Unable to do lot of his  Continent care  4. Cancer of transverse colon ([place]) On Immunotherapy for Palliation S/p Colectomy Appetite is Poor Maintaining his weight Also Has diarrhea intermittent  Other issues  Periprosthetic fracture around internal prosthetic joint, subsequent encounter Doing really well On Tylenol PRN Walking with his walker  Longstanding persistent atrial fibrillation: CHA2DS2-VASc Score 3 Coumadin and Zebeta   Stage 3b chronic kidney disease ([place]) Creat stable CAD On Imdur  Hypertension On Zebeta Family/ staff Communication:   Labs/tests ordered:

## 2022-02-12 DIAGNOSIS — R2681 Unsteadiness on feet: Secondary | ICD-10-CM | POA: Diagnosis not present

## 2022-02-12 DIAGNOSIS — Z9181 History of falling: Secondary | ICD-10-CM | POA: Diagnosis not present

## 2022-02-12 DIAGNOSIS — S300XXD Contusion of lower back and pelvis, subsequent encounter: Secondary | ICD-10-CM | POA: Diagnosis not present

## 2022-02-12 DIAGNOSIS — M6281 Muscle weakness (generalized): Secondary | ICD-10-CM | POA: Diagnosis not present

## 2022-02-12 DIAGNOSIS — M9702XD Periprosthetic fracture around internal prosthetic left hip joint, subsequent encounter: Secondary | ICD-10-CM | POA: Diagnosis not present

## 2022-02-13 DIAGNOSIS — Z9181 History of falling: Secondary | ICD-10-CM | POA: Diagnosis not present

## 2022-02-13 DIAGNOSIS — S300XXD Contusion of lower back and pelvis, subsequent encounter: Secondary | ICD-10-CM | POA: Diagnosis not present

## 2022-02-13 DIAGNOSIS — M9702XD Periprosthetic fracture around internal prosthetic left hip joint, subsequent encounter: Secondary | ICD-10-CM | POA: Diagnosis not present

## 2022-02-13 DIAGNOSIS — R2681 Unsteadiness on feet: Secondary | ICD-10-CM | POA: Diagnosis not present

## 2022-02-13 DIAGNOSIS — M6281 Muscle weakness (generalized): Secondary | ICD-10-CM | POA: Diagnosis not present

## 2022-02-15 DIAGNOSIS — M9702XD Periprosthetic fracture around internal prosthetic left hip joint, subsequent encounter: Secondary | ICD-10-CM | POA: Diagnosis not present

## 2022-02-15 DIAGNOSIS — Z9181 History of falling: Secondary | ICD-10-CM | POA: Diagnosis not present

## 2022-02-15 DIAGNOSIS — R2681 Unsteadiness on feet: Secondary | ICD-10-CM | POA: Diagnosis not present

## 2022-02-15 DIAGNOSIS — M6281 Muscle weakness (generalized): Secondary | ICD-10-CM | POA: Diagnosis not present

## 2022-02-15 DIAGNOSIS — S300XXD Contusion of lower back and pelvis, subsequent encounter: Secondary | ICD-10-CM | POA: Diagnosis not present

## 2022-02-16 DIAGNOSIS — S300XXD Contusion of lower back and pelvis, subsequent encounter: Secondary | ICD-10-CM | POA: Diagnosis not present

## 2022-02-16 DIAGNOSIS — Z9181 History of falling: Secondary | ICD-10-CM | POA: Diagnosis not present

## 2022-02-16 DIAGNOSIS — M9702XD Periprosthetic fracture around internal prosthetic left hip joint, subsequent encounter: Secondary | ICD-10-CM | POA: Diagnosis not present

## 2022-02-16 DIAGNOSIS — M6281 Muscle weakness (generalized): Secondary | ICD-10-CM | POA: Diagnosis not present

## 2022-02-16 DIAGNOSIS — R2681 Unsteadiness on feet: Secondary | ICD-10-CM | POA: Diagnosis not present

## 2022-02-17 ENCOUNTER — Non-Acute Institutional Stay: Payer: Medicare Other | Admitting: Orthopedic Surgery

## 2022-02-17 ENCOUNTER — Encounter: Payer: Self-pay | Admitting: Orthopedic Surgery

## 2022-02-17 DIAGNOSIS — M6281 Muscle weakness (generalized): Secondary | ICD-10-CM | POA: Diagnosis not present

## 2022-02-17 DIAGNOSIS — S300XXD Contusion of lower back and pelvis, subsequent encounter: Secondary | ICD-10-CM | POA: Diagnosis not present

## 2022-02-17 DIAGNOSIS — F320 Major depressive disorder, single episode, mild: Secondary | ICD-10-CM

## 2022-02-17 DIAGNOSIS — Z9181 History of falling: Secondary | ICD-10-CM | POA: Diagnosis not present

## 2022-02-17 DIAGNOSIS — M9702XD Periprosthetic fracture around internal prosthetic left hip joint, subsequent encounter: Secondary | ICD-10-CM | POA: Diagnosis not present

## 2022-02-17 DIAGNOSIS — R630 Anorexia: Secondary | ICD-10-CM

## 2022-02-17 DIAGNOSIS — R339 Retention of urine, unspecified: Secondary | ICD-10-CM

## 2022-02-17 DIAGNOSIS — R198 Other specified symptoms and signs involving the digestive system and abdomen: Secondary | ICD-10-CM

## 2022-02-17 DIAGNOSIS — C184 Malignant neoplasm of transverse colon: Secondary | ICD-10-CM

## 2022-02-17 DIAGNOSIS — R2681 Unsteadiness on feet: Secondary | ICD-10-CM | POA: Diagnosis not present

## 2022-02-17 MED ORDER — LOPERAMIDE HCL 2 MG PO TABS: 4.0000 mg | ORAL_TABLET | Freq: Every day | ORAL | 0 refills | Status: AC | PRN

## 2022-02-17 NOTE — Progress Notes (Signed)
Location:   [facility] Room Number: 34-A Place of Service:  ALF [phone]) Provider:  Windell Moulding, NP  PCP: Virgie Dad, MD  Patient Care Team: Virgie Dad, MD as PCP - General (Internal Medicine) Leonie Man, MD as PCP - Cardiology (Cardiology) [name], MD as Consulting Physician (Hematology)  Extended Emergency Contact Information Primary Emergency Contact: Chrissie Noa of [place] Phone: [phone] Mobile Phone: [phone] Relation: Son  Code Status:  DNR Goals of care: Advanced Directive information    02/17/2022   10:27 AM  Advanced Directives  Does Patient Have a Medical Advance Directive? Yes  Type of Advance Directive Living will;Out of facility DNR (pink MOST or yellow form)  Does patient want to make changes to medical advance directive? No - Patient declined     Chief Complaint  Patient presents with   Acute Visit    Constipation.    HPI:  Pt is a 88 y.o. male seen today for an acute visit for diarrhea and constipation.   [place] Continues to receive PT/OT for weakness.   He reports intermittent diarrhea and constipation within the past week. Reports having multiple episodes of diarrhea yesterday and unable to leave room. He is unable to describe stools/amount. Followed by Dr. Burr Medico for active colon cancer. Receiving Keytruda. 01/01/2022 CT abdomen showed enlarged gastrohepatic lymph nodes and decreased rectocaval lymph nodes, no new lymphadenopathy or metastasis. He was restarted on oxycodone prn due to increased umbilical pain about 2 weeks ago. Oxycodone effective in reducing pain. He is unsure if oxycodone is causing intermittent constipation. He is able to have senna and miralax prn. Cannot recall last bowel movement.   08/07 he was started on Remeron 7.5 mg due to depression. Wife passed away 01/20/22. Appears sad today, he admits feeling blue. He is starting to attend  activities offered at [employer] and eat one meal in dining room. Son very supportive and active in his life.   08/17 I/O cath due to urinary retention, 300 cc out. Seen by urology in past due to retention advised to I/O cath self. Advised to cath prn. Refused to go see urology. Denies urinary retention today.   Appetite still poor at times. Remains on Boost.      Past Medical History:  Diagnosis Date   Anemia    leakoppenia   BPH (benign prostatic hypertrophy)    Bullous pemphigoid    [name], March 2011, right forearm squamous cell carcinoma   Chronic anticoagulation    systemic   Colon polyp    transverse, 2002   History of peptic ulcer    remote, 3/95   Hx of actinic keratosis    Hx of basal cell carcinoma    Hx of squamous cell carcinoma of skin    Hyperlipidemia    Left inguinal hernia    Moderate aortic insufficiency 2009   audible aortic insufficiency on 1/09 echo   PAF (paroxysmal atrial fibrillation) ([place]) 01/17/2014   On Warfarin.   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 Jude mechanical valve   ORIF FEMUR FRACTURE Left 08/28/2020   Procedure: OPEN REDUCTION INTERNAL FIXATION (ORIF) DISTAL FEMUR FRACTURE;  Surgeon: Rod Can, MD;  Location: [facility];  Service: Orthopedics;  Laterality: Left;    PARTIAL COLECTOMY N/A 06/30/2021   Procedure: PARTIAL COLECTOMY;  Surgeon: Clovis Riley, MD;  Location: [facility];  Service: General;  Laterality: N/A;   TOTAL HIP ARTHROPLASTY Right 10/12/2017   Procedure: RIGHT TOTAL HIP ARTHROPLASTY ANTERIOR APPROACH;  Surgeon: Gaynelle Arabian, MD;  Location: [facility];  Service: Orthopedics;  Laterality: Right;   TRANSTHORACIC ECHOCARDIOGRAM  12/2018   Unable to assess diastolic function because of A. fib. Normal RV function, but moderately elevated RVSP.  Severe biatrial enlargement. S/P St Jude bileaflet mechanical MVR that appears to be functioning normally. Mitral valve regurgitation cannot assess due to mechanical valve shadowing. MV Mean grad: 7.0 mmHg MV Area (PHT): 3.38 cm (stable for valve).  Mild Ao Sclerosis, Mild-Mod AI   TRANSTHORACIC ECHOCARDIOGRAM  08/'17; 10/'18    a) Mild conc LVH. EF 55-60%. No RWMA. Mod AI. Mechanical MV prosthesis functioning properly. LAD dilation.;; b)  EF 55-60%.  Mo AI.  Bileaflet Saint Jude mechanical MV with no paravalvular leak.  Severe LA dilation.  Minimally elevated PAP    Allergies  Allergen Reactions   Flexeril [Cyclobenzaprine] Diarrhea    Allergies as of 02/17/2022       Reactions   Flexeril [cyclobenzaprine] Diarrhea        Medication List        Accurate as of February 17, 2022 10:27 AM. If you have any questions, ask your nurse or doctor.          acetaminophen 500 MG tablet Commonly known as: TYLENOL Take 1,000 mg by mouth in the morning and at bedtime.   acetaminophen 500 MG tablet Commonly known as: TYLENOL Take 1,000 mg by mouth 3 (three) times daily as needed.   bisoprolol 5 MG tablet Commonly known as: ZEBETA Take 1 tablet (5 mg total) by mouth daily.   ferrous sulfate 325 (65 FE) MG tablet Take 1 tablet (325 mg total) by mouth daily with breakfast.   furosemide 40 MG tablet Commonly known as: LASIX Take 40 mg by mouth 4 (four) times a week. Sun, Tue, Thurs, Sat   furosemide  20 MG tablet Commonly known as: LASIX Take 20 mg by mouth 3 (three) times a week. Mon, Wed, Fri   isosorbide mononitrate 30 MG 24 hr tablet Commonly known as: IMDUR Take 30 mg by mouth daily.   lactose free nutrition Liqd Take 237 mLs by mouth 3 (three) times daily between meals.   LORazepam 0.5 MG tablet Commonly known as: ATIVAN Take 1 tablet (0.5 mg total) by mouth daily.   mirtazapine 15 MG tablet Commonly known as: REMERON Take 15 mg by mouth at bedtime.   MULTIVITAMIN PO Take 1 tablet by mouth daily.   omeprazole 20 MG capsule Commonly known as: PRILOSEC Take 20 mg by mouth daily.   oxyCODONE 5 MG immediate release tablet Commonly known as: Roxicodone Take 1 tablet (5 mg total) by mouth 2 (two) times daily as needed for severe  pain or breakthrough pain.   polyethylene glycol 17 g packet Commonly known as: MIRALAX / GLYCOLAX Take 17 g by mouth daily as needed for mild constipation.   PreserVision AREDS 2 Caps Take 1 capsule by mouth daily.   senna 8.6 MG Tabs tablet Commonly known as: SENOKOT Take 2 tablets by mouth daily as needed for mild constipation.   triamcinolone ointment 0.5 % Commonly known as: KENALOG Apply 1 Application topically 2 (two) times daily as needed.   warfarin 2.5 MG tablet Commonly known as: COUMADIN Take as directed by the anticoagulation clinic. If you are unsure how to take this medication, talk to your nurse or doctor. Original instructions: Take 2.5 mg by mouth. [facility]  Thu, Sat   warfarin 5 MG tablet Commonly known as: COUMADIN Take as directed by the anticoagulation clinic. If you are unsure how to take this medication, talk to your nurse or doctor. Original instructions: Take 5 mg by mouth 2 (two) times a week. Once A Day on Mon, Fri   zinc oxide 20 % ointment Apply 1 application. topically as needed for irritation.        Review of Systems  Constitutional:  Negative for activity change, appetite change, chills,  fatigue and fever.  HENT:  Negative for trouble swallowing.   Eyes:  Negative for photophobia and visual disturbance.  Respiratory:  Negative for cough, shortness of breath and wheezing.   Cardiovascular:  Negative for chest pain and leg swelling.  Gastrointestinal:  Positive for abdominal pain, blood in stool, constipation and diarrhea. Negative for abdominal distention, nausea, rectal pain and vomiting.  Genitourinary:  Negative for dysuria, frequency and hematuria.       Urinary retention  Musculoskeletal:  Positive for arthralgias and gait problem.  Skin:  Negative for wound.  Neurological:  Positive for weakness. Negative for dizziness and headaches.  Psychiatric/Behavioral:  Positive for dysphoric mood. Negative for confusion, decreased concentration, sleep disturbance and suicidal ideas. The patient is not nervous/anxious.     Immunization History  Administered Date(s) Administered   Influenza Split 02/27/2012, 03/28/2013, 04/10/2014, 05/02/2015, 04/20/2016, 03/28/2017, 03/28/2018, 04/11/2019   Influenza, High Dose Seasonal PF 04/20/2016   Influenza,inj,Quad PF,6+ Mos 03/28/2018   Influenza-Unspecified 03/30/2017, 04/08/2020   Moderna SARS-COV2 Booster Vaccination 12/03/2020   Moderna Sars-Covid-2 Vaccination 07/02/2019, 07/30/2019, 05/12/2020   Pfizer Covid-19 Vaccine Bivalent Booster 44yr & up 04/15/2021   Pneumococcal Conjugate-13 06/10/2014   Pneumococcal Polysaccharide-23 02/02/2006   Pneumococcal-Unspecified 02/02/2006   Td 12/28/2002, 04/11/2014   Tdap 06/10/2014   Zoster, Live 03/29/2015, 11/28/2019, 01/28/2020   Zoster, Unspecified 05/02/2015   Pertinent  Health Maintenance Due  Topic Date Due   INFLUENZA VACCINE  01/26/2022      11/17/2021   10:15 AM 12/11/2021    1:00 PM 01/08/2022    2:40 PM 01/28/2022   11:00 AM 01/29/2022    2:15 PM  Fall Risk  Falls in the past year?     1  Was there an injury with Fall?     1  Fall Risk Category Calculator     3  Fall  Risk Category     High  Patient Fall Risk Level High fall risk High fall risk High fall risk High fall risk High fall risk  Patient at Risk for Falls Due to     History of fall(s);Impaired balance/gait;Impaired mobility  Fall risk Follow up     Falls evaluation completed;Education provided;Falls prevention discussed   Functional Status Survey:    Vitals:  02/17/22 1019  BP: (!) 148/82  Pulse: 78  Resp: 20  Temp: 98.1 F (36.7 C)  SpO2: 94%  Weight: 133 lb (60.3 kg)  Height: '5\' 11"'$  (1.803 m)   Body mass index is 18.55 kg/m. Physical Exam Vitals reviewed.  Constitutional:      General: He is not in acute distress.    Comments: Frail appearance  HENT:     Head: Normocephalic.  Eyes:     General:        Right eye: No discharge.        Left eye: No discharge.  Cardiovascular:     Rate and Rhythm: Normal rate. Rhythm irregular.     Pulses: Normal pulses.     Heart sounds: Normal heart sounds.  Pulmonary:     Effort: Pulmonary effort is normal. No respiratory distress.     Breath sounds: Normal breath sounds. No wheezing or rales.  Abdominal:     General: Bowel sounds are normal. There is no distension.     Palpations: Abdomen is soft.     Tenderness: There is abdominal tenderness. There is no guarding.     Comments: Umbilical tenderness  Musculoskeletal:     Cervical back: Neck supple.     Right lower leg: No edema.     Left lower leg: No edema.  Skin:    General: Skin is warm and dry.     Capillary Refill: Capillary refill takes less than 2 seconds.  Neurological:     General: No focal deficit present.     Mental Status: He is alert and oriented to person, place, and time.     Motor: No weakness.     Gait: Gait normal.     Comments: Rolator/wheelchair  Psychiatric:        Mood and Affect: Mood normal.        Behavior: Behavior normal.    Labs reviewed: Recent Labs    07/06/21 0156 07/06/21 1216 11/13/21 0347 11/14/21 0331 11/16/21 0333 11/17/21 0311  11/20/21 0000 01/01/22 1050 01/08/22 1415 01/28/22 0955  NA  --    < > 134*   < > 136 135   < > 130* 128* 130*  K  --    < > 3.8   < > 4.6 4.9   < > 4.3 4.3 3.9  CL  --    < > 103   < > 105 106   < > 98 98 97*  CO2  --    < > 26   < > 25 25   < > '27 25 26  '$ GLUCOSE  --    < > 97   < > 126* 97   < > 104* 69* 150*  BUN  --    < > 43*   < > 29* 34*   < > 36* 33* 35*  CREATININE  --    < > 1.60*   < > 1.22 1.20   < > 1.36* 1.30* 1.24  CALCIUM  --    < > 8.2*   < > 8.6* 8.6*   < > 9.3 9.0 9.0  MG  --    < > 2.3  --  2.3 2.2  --   --   --   --   PHOS 3.0  --   --   --  2.7 2.7  --   --   --   --    < > = values in this  interval not displayed.   Recent Labs    01/01/22 1050 01/08/22 1415 01/28/22 0955  AST 30 31 42*  ALT '19 17 21  '$ ALKPHOS 67 64 61  BILITOT 0.7 0.6 0.6  PROT 7.4 7.1 7.5  ALBUMIN 3.7 3.6 3.9   Recent Labs    01/01/22 1050 01/08/22 1415 01/28/22 0955  WBC 4.2 4.4 4.1  NEUTROABS 3.1 3.1 3.4  HGB 9.7* 9.4* 10.3*  HCT 30.1* 28.2* 30.5*  MCV 99.3 96.6 94.7  PLT 175 192 173   Lab Results  Component Value Date   TSH 6.468 (H) 01/28/2022   No results found for: "HGBA1C" No results found for: "CHOL", "HDL", "LDLCALC", "LDLDIRECT", "TRIG", "CHOLHDL"  Significant Diagnostic Results in last 30 days:  No results found.  Assessment/Plan 1. Alternating constipation and diarrhea - onset x 1 week - multiple episodes diarrhea yesterday - suspect related to colon cancer - umbilical tenderness, no distension, normal BS x 4  - ? Constipation since restarting oxycodone  - start senna 8.6 mg- give 2 tablets qhs - start Loperamide 4 mg po daily prn for diarrhea - cont miralax prn  2. Cancer of transverse colon ([place]) - followed by Dr. Burr Medico - on Keytruda - increased abdominal pain x 2 weeks - cont oxycodone BID prn  3. Current mild episode of major depressive disorder without prior episode [facility]) - 07/02 wife passed, declining health, move [place] to [place] - 08/07  Remeron 7.5 mg started - good family support - starting to leave room more often  4. Urinary retention - 08/17 I/O cath- 300 cc - seen urology in past- refused f/u - cont I/O cath prn  5. Poor appetite - ongoing - cont Boost - cont monthly weights    Family/ staff Communication: plan discussed with patient and nurse  Labs/tests ordered:  none

## 2022-02-18 ENCOUNTER — Inpatient Hospital Stay: Payer: Medicare Other

## 2022-02-18 ENCOUNTER — Encounter: Payer: Self-pay | Admitting: Hematology

## 2022-02-18 ENCOUNTER — Inpatient Hospital Stay (HOSPITAL_BASED_OUTPATIENT_CLINIC_OR_DEPARTMENT_OTHER): Payer: Medicare Other | Admitting: Hematology

## 2022-02-18 VITALS — BP 123/63 | HR 66 | Temp 98.0°F | Resp 16

## 2022-02-18 DIAGNOSIS — Z79899 Other long term (current) drug therapy: Secondary | ICD-10-CM | POA: Diagnosis not present

## 2022-02-18 DIAGNOSIS — M6281 Muscle weakness (generalized): Secondary | ICD-10-CM | POA: Diagnosis not present

## 2022-02-18 DIAGNOSIS — C772 Secondary and unspecified malignant neoplasm of intra-abdominal lymph nodes: Secondary | ICD-10-CM | POA: Diagnosis not present

## 2022-02-18 DIAGNOSIS — Z5112 Encounter for antineoplastic immunotherapy: Secondary | ICD-10-CM | POA: Diagnosis not present

## 2022-02-18 DIAGNOSIS — C184 Malignant neoplasm of transverse colon: Secondary | ICD-10-CM | POA: Diagnosis not present

## 2022-02-18 DIAGNOSIS — D500 Iron deficiency anemia secondary to blood loss (chronic): Secondary | ICD-10-CM

## 2022-02-18 DIAGNOSIS — M9702XD Periprosthetic fracture around internal prosthetic left hip joint, subsequent encounter: Secondary | ICD-10-CM | POA: Diagnosis not present

## 2022-02-18 DIAGNOSIS — R2681 Unsteadiness on feet: Secondary | ICD-10-CM | POA: Diagnosis not present

## 2022-02-18 DIAGNOSIS — S300XXD Contusion of lower back and pelvis, subsequent encounter: Secondary | ICD-10-CM | POA: Diagnosis not present

## 2022-02-18 DIAGNOSIS — Z9181 History of falling: Secondary | ICD-10-CM | POA: Diagnosis not present

## 2022-02-18 LAB — CBC WITH DIFFERENTIAL (CANCER CENTER ONLY)
Abs Immature Granulocytes: 0.05 10*3/uL (ref 0.00–0.07)
Basophils Absolute: 0 10*3/uL (ref 0.0–0.1)
Basophils Relative: 0 %
Eosinophils Absolute: 0.3 10*3/uL (ref 0.0–0.5)
Eosinophils Relative: 6 %
HCT: 27.3 % — ABNORMAL LOW (ref 39.0–52.0)
Hemoglobin: 9.4 g/dL — ABNORMAL LOW (ref 13.0–17.0)
Immature Granulocytes: 1 %
Lymphocytes Relative: 7 %
Lymphs Abs: 0.3 10*3/uL — ABNORMAL LOW (ref 0.7–4.0)
MCH: 31.8 pg (ref 26.0–34.0)
MCHC: 34.4 g/dL (ref 30.0–36.0)
MCV: 92.2 fL (ref 80.0–100.0)
Monocytes Absolute: 0.4 10*3/uL (ref 0.1–1.0)
Monocytes Relative: 8 %
Neutro Abs: 3.5 10*3/uL (ref 1.7–7.7)
Neutrophils Relative %: 78 %
Platelet Count: 170 10*3/uL (ref 150–400)
RBC: 2.96 MIL/uL — ABNORMAL LOW (ref 4.22–5.81)
RDW: 14.8 % (ref 11.5–15.5)
WBC Count: 4.5 10*3/uL (ref 4.0–10.5)
nRBC: 0 % (ref 0.0–0.2)

## 2022-02-18 LAB — CMP (CANCER CENTER ONLY)
ALT: 19 U/L (ref 0–44)
AST: 48 U/L — ABNORMAL HIGH (ref 15–41)
Albumin: 3.5 g/dL (ref 3.5–5.0)
Alkaline Phosphatase: 56 U/L (ref 38–126)
Anion gap: 5 (ref 5–15)
BUN: 34 mg/dL — ABNORMAL HIGH (ref 8–23)
CO2: 25 mmol/L (ref 22–32)
Calcium: 9 mg/dL (ref 8.9–10.3)
Chloride: 95 mmol/L — ABNORMAL LOW (ref 98–111)
Creatinine: 1.22 mg/dL (ref 0.61–1.24)
GFR, Estimated: 55 mL/min — ABNORMAL LOW (ref >60)
Glucose, Bld: 112 mg/dL — ABNORMAL HIGH (ref 70–99)
Potassium: 4.7 mmol/L (ref 3.5–5.1)
Sodium: 125 mmol/L — ABNORMAL LOW (ref 135–145)
Total Bilirubin: 0.6 mg/dL (ref 0.3–1.2)
Total Protein: 6.6 g/dL (ref 6.5–8.1)

## 2022-02-18 LAB — TSH: TSH: 6.566 u[IU]/mL — ABNORMAL HIGH (ref 0.350–4.500)

## 2022-02-18 LAB — FERRITIN: Ferritin: 203 ng/mL (ref 24–336)

## 2022-02-18 MED ORDER — SODIUM CHLORIDE 0.9 % IV SOLN
200.0000 mg | Freq: Once | INTRAVENOUS | Status: AC
  Administered 2022-02-18: 200 mg via INTRAVENOUS
  Filled 2022-02-18: qty 8

## 2022-02-18 MED ORDER — SODIUM CHLORIDE 0.9 % IV SOLN: Freq: Once | INTRAVENOUS | Status: AC

## 2022-02-18 NOTE — Progress Notes (Signed)
[place]   Telephone:(336) [phone] Fax:(336) [phone]   Clinic Follow up Note   Patient Care Team: Virgie Dad, MD as PCP - General (Internal Medicine) Leonie Man, MD as PCP - Cardiology (Cardiology) [name], MD as Consulting Physician (Hematology)  Date of Service:  02/18/2022  CHIEF COMPLAINT: f/u of colon cancer  CURRENT THERAPY:  [facility], starting 10/07/21  ASSESSMENT & PLAN:  Richard Spears is a 88 y.o. male with   1. Cancer of transverse colon, stage IIIa pT2N1cM0, loss of MLH1 and PMS2, nodal recurrence in 08/2021  -presented to ED on 06/24/21 with hematochezia and rectal bleeding. Work up CT showed mass-like filling defect within mid transverse colon. Inpatient colonoscopy on 06/27/21 under Dr. Watt Climes showed a large partially-obstructing transverse colon mass. Pathology confirmed poorly differentiated adenocarcinoma, MMR abnormal with loss of expression in MLH1 and PMS2, MLH1 hypermethylation positive, this is likely sporadic colon cancer. -partial colectomy on 06/30/21 under Dr. Windle Guard showed 5 cm poorly differentiated adenocarcinoma invading muscularis propria. He had one tumor deposit  -baseline CEA on 09/09/21 slightly elevated to 6.84. -staging PET on 10/16/21 showed: intense multifocal hypermetabolic nodal metastases; no definite activity at colonic anastomosis or distant metastasis. -he began [place] on 10/07/21, held briefly following fall on 11/12/21. -restaging CT AP on 01/01/22 showed mixed response-- enlarging gastrohepatic lymph nodes, decrease in retrocaval lymph nodes, no new lymphadenopathy or metastatic disease. Will continue Keytruda for now and repeat scan in 2-3 months. -labs reviewed, overall stable to improved. Hgb is back down to 9.4, and his sodium is lower today, 125. Will still proceed with [place] today. -repeat CT in 5-6 weeks   2. Social -he lives in [facility] and uses their or our transportation. He lost his  wife in early 12/2021. -his son lives out of state but is involved in his care.   3. Hyponatremia  -Likely related to slight dehydration, was on Lasix 20 mg daily Monday Wednesday and Friday, and 40 mg daily for the rest of week -I recommend him to reduce her Lasix 20 mg daily, we will copy his cardiologist also    PLAN: -proceed with Keytruda today -Due to his worsening hyponatremia, and low oral intake, I recommend him to reduce Lasix to 20 mg daily (he is on 15m daily MWF and 434mdaily for the rest of week) -lab, f/u, and Keytruda every 3 weeks -restaging CT in 5-6 weeks   No problem-specific Assessment & Plan notes found for this encounter.   SUMMARY OF ONCOLOGIC HISTORY: Oncology History Overview Note   Cancer Staging  Cancer of transverse colon  Staging form: Colon and Rectum, AJCC 8th Edition - Pathologic stage from 06/30/2021: Stage I (pT2, pN0, cM0) - Signed by FeTruitt MerleMD on 07/03/2021    Cancer of transverse colon   06/24/2021 Imaging   EXAM: CTA ABDOMEN AND PELVIS WITHOUT AND WITH CONTRAST  MPRESSION: 1. Avid arterial phase enhancing masslike filling defect within the mid transverse colon is identified. Although conceivably this could represent a large blood clot the diagnosis of exclusion is a primary colonic neoplasm. Further evaluation with colonoscopy is recommended. 2. Extensive atherosclerotic disease noted with stenosis noted at the origin of the celiac artery, SMA, IMA and both renal arteries. 3. 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 4. Large left inguinal hernia contains a nonobstructed loop of large bowel. 5. Aortic Atherosclerosis (ICD10-I70.0).   06/27/2021 Procedure   Colonoscopy, Dr. MaWatt ClimesImpression: - Likely malignant  partially obstructing tumor in the mid transverse colon. Biopsied. - The examination was otherwise normal on direct and retroflexion views. Unable to advance the scope past the tumor    06/27/2021 Initial Biopsy   FINAL MICROSCOPIC DIAGNOSIS:   A. TRANSVERSE COLON, BIOPSY:  - Poorly differentiated adenocarcinoma, see comment    ADDENDUM:  Mismatch Repair Protein (IHC)  SUMMARY INTERPRETATION: ABNORMAL   IHC EXPRESSION RESULTS   TEST           RESULT  MLH1:          LOSS OF NUCLEAR EXPRESSION  MSH2:          Preserved nuclear expression  MSH6:          Preserved nuclear expression  PMS2:          LOSS OF NUCLEAR EXPRESSION    06/30/2021 Cancer Staging   Staging form: Colon and Rectum, AJCC 8th Edition - Pathologic stage from 06/30/2021: Stage IIIA (pT2, pN1c, cM0) - Signed by [name], MD on 10/07/2021 Stage prefix: Initial diagnosis Total positive nodes: 0 Residual tumor (R): R0 - None   06/30/2021 Definitive Surgery   FINAL MICROSCOPIC DIAGNOSIS:   A. COLON, TRANSVERSE, PARTIAL COLECTOMY:  Poorly differentiated adenocarcinoma invading muscularis propria.  See oncology table for details.    07/02/2021 Imaging   EXAM: CT CHEST WITHOUT CONTRAST  IMPRESSION: 1. No evidence of metastatic disease in the thorax. 2. Trace bilateral pleural effusions and associated atelectasis. 3. Multi chamber cardiomegaly. Coronary artery calcifications. Prominence of the central pulmonary artery suggesting pulmonary arterial hypertension.   07/03/2021 Initial Diagnosis   Cancer of transverse colon ([place])   10/07/2021 -  Chemotherapy   Patient is on Treatment Plan : COLORECTAL Pembrolizumab (200) q21d     10/16/2021 PET scan   IMPRESSION: 1. Intense multifocal hypermetabolic nodal metastases within the gastrohepatic ligament and upper retroperitoneum. This nodal disease appears mildly progressive compared with CT of 4 weeks ago. 2. No definite hypermetabolic activity at the colonic anastomosis as suggested on previous CT. 3. No hepatic or other definite distant metastases identified. Low-level hilar activity bilaterally is nonspecific, but likely reactive. 4. Recent compression  deformities at T10 and T11 with mild hypermetabolic activity.      INTERVAL HISTORY:  Richard Spears is here for a follow up of colon cancer. He was last seen by me on 01/28/22. He was seen in the infusion area. He reports he is still having loose BM, and he goes every several hours.   All other systems were reviewed with the patient and are negative.  MEDICAL HISTORY:  Past Medical History:  Diagnosis Date   Anemia    leakoppenia   BPH (benign prostatic hypertrophy)    Bullous pemphigoid    [name], March 2011, right forearm squamous cell carcinoma   Chronic anticoagulation    systemic   Colon polyp    transverse, 2002   History of peptic ulcer    remote, 3/95   Hx of actinic keratosis    Hx of basal cell carcinoma    Hx of squamous cell carcinoma of skin    Hyperlipidemia    Left inguinal hernia    Moderate aortic insufficiency 2009   audible aortic insufficiency on 1/09 echo   PAF (paroxysmal atrial fibrillation) ([place]) 01/17/2014   On Warfarin.   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 Jude mechanical valve   ORIF FEMUR FRACTURE Left 08/28/2020   Procedure: OPEN REDUCTION INTERNAL FIXATION (ORIF) DISTAL FEMUR FRACTURE;  Surgeon: Swinteck, Brian, MD;  Location: [facility] OR;  Service:  Orthopedics;  Laterality: Left;   PARTIAL COLECTOMY N/A 06/30/2021   Procedure: PARTIAL COLECTOMY;  Surgeon: Connor, Chelsea A, MD;  Location: [facility] OR;  Service: General;  Laterality: N/A;   TOTAL HIP ARTHROPLASTY Right 10/12/2017   Procedure: RIGHT TOTAL HIP ARTHROPLASTY ANTERIOR APPROACH;  Surgeon: Aluisio, Frank, MD;  Location: [facility] ORS;  Service: Orthopedics;  Laterality: Right;   TRANSTHORACIC ECHOCARDIOGRAM  12/2018   Unable to assess diastolic function because of A. fib. Normal RV function, but moderately elevated RVSP.  Severe biatrial enlargement. S/P St Jude bileaflet mechanical MVR that appears to be functioning normally. Mitral valve regurgitation cannot assess due to mechanical valve shadowing. MV Mean grad: 7.0 mmHg MV Area (PHT): 3.38 cm (stable for valve).  Mild Ao Sclerosis, Mild-Mod AI   TRANSTHORACIC ECHOCARDIOGRAM  08/'17; 10/'18    a) Mild conc LVH. EF 55-60%. No RWMA. Mod AI. Mechanical MV prosthesis functioning properly. LAD dilation.;; b)  EF 55-60%.  Mo AI.  Bileaflet Saint Jude mechanical MV with no paravalvular leak.  Severe LA dilation.  Minimally elevated PAP    I have reviewed the social history and family history with the patient and they are unchanged from previous note.  ALLERGIES:  is allergic to flexeril [cyclobenzaprine].  MEDICATIONS:  Current Outpatient Medications  Medication Sig Dispense Refill   acetaminophen (TYLENOL) 500 MG tablet Take 1,000 mg by mouth in the morning and at bedtime.     acetaminophen (TYLENOL) 500 MG tablet Take 1,000 mg by mouth 3 (three) times daily as needed.     bisoprolol (ZEBETA) 5 MG tablet Take 1 tablet (5 mg total) by mouth daily. 90 tablet 3   ferrous sulfate 325 (65 FE) MG tablet Take 1 tablet (325 mg total) by mouth daily with breakfast.  3   furosemide (LASIX) 20 MG tablet Take 20 mg by mouth 3 (three) times a week. Mon, Wed, Fri     furosemide (LASIX) 40 MG tablet Take 40 mg by mouth 4 (four) times a week. Sun, Tue, Thurs,  Sat     isosorbide mononitrate (IMDUR) 30 MG 24 hr tablet Take 30 mg by mouth daily.     lactose free nutrition (BOOST) LIQD Take 237 mLs by mouth 3 (three) times daily between meals.     loperamide (IMODIUM A-D) 2 MG tablet Take 2 tablets (4 mg total) by mouth daily as needed for diarrhea or loose stools. 30 tablet 0   LORazepam (ATIVAN) 0.5 MG tablet Take 1 tablet (0.5 mg total) by mouth daily. 30 tablet 0   mirtazapine (REMERON) 15 MG tablet Take 15 mg by mouth at bedtime.     Multiple Vitamins-Minerals (MULTIVITAMIN PO) Take 1 tablet   by mouth daily.     Multiple Vitamins-Minerals (PRESERVISION AREDS 2) CAPS Take 1 capsule by mouth daily. 90 capsule 0   omeprazole (PRILOSEC) 20 MG capsule Take 20 mg by mouth daily.     oxyCODONE (ROXICODONE) 5 MG immediate release tablet Take 1 tablet (5 mg total) by mouth 2 (two) times daily as needed for severe pain or breakthrough pain. 30 tablet 0   polyethylene glycol (MIRALAX / GLYCOLAX) 17 g packet Take 17 g by mouth daily as needed for mild constipation.     senna (SENOKOT) 8.6 MG TABS tablet Take 2 tablets by mouth every evening.     triamcinolone ointment (KENALOG) 0.5 % Apply 1 Application topically 2 (two) times daily as needed.     warfarin (COUMADIN) 2.5 MG tablet Take 2.5 mg by mouth. Sun, Tue,wed  Thu, Sat     warfarin (COUMADIN) 5 MG tablet Take 5 mg by mouth 2 (two) times a week. Once A Day on Mon, Fri     zinc oxide 20 % ointment Apply 1 application. topically as needed for irritation.     No current facility-administered medications for this visit.    PHYSICAL EXAMINATION: ECOG PERFORMANCE STATUS: 2 - Symptomatic, <50% confined to bed  There were no vitals filed for this visit. Wt Readings from Last 3 Encounters:  02/17/22 133 lb (60.3 kg)  02/11/22 133 lb (60.3 kg)  02/01/22 131 lb (59.4 kg)     GENERAL:alert, no distress and comfortable SKIN: skin color normal, no significant lesions, (+) rash to neck EYES: normal, Conjunctiva  are pink and non-injected, sclera clear  NEURO: alert & oriented x 3 with fluent speech  LABORATORY DATA:  I have reviewed the data as listed    Latest Ref Rng & Units 02/18/2022    2:07 PM 01/28/2022    9:55 AM 01/08/2022    2:15 PM  CBC  WBC 4.0 - 10.5 K/uL 4.5  4.1  4.4   Hemoglobin 13.0 - 17.0 g/dL 9.4  10.3  9.4   Hematocrit 39.0 - 52.0 % 27.3  30.5  28.2   Platelets 150 - 400 K/uL 170  173  192         Latest Ref Rng & Units 02/18/2022    2:07 PM 01/28/2022    9:55 AM 01/08/2022    2:15 PM  CMP  Glucose 70 - 99 mg/dL 112  150  69   BUN 8 - 23 mg/dL 34  35  33   Creatinine 0.61 - 1.24 mg/dL 1.22  1.24  1.30   Sodium 135 - 145 mmol/L 125  130  128   Potassium 3.5 - 5.1 mmol/L 4.7  3.9  4.3   Chloride 98 - 111 mmol/L 95  97  98   CO2 22 - 32 mmol/L 25  26  25   Calcium 8.9 - 10.3 mg/dL 9.0  9.0  9.0   Total Protein 6.5 - 8.1 g/dL 6.6  7.5  7.1   Total Bilirubin 0.3 - 1.2 mg/dL 0.6  0.6  0.6   Alkaline Phos 38 - 126 U/L 56  61  64   AST 15 - 41 U/L 48  42  31   ALT 0 - 44 U/L 19  21  17       RADIOGRAPHIC STUDIES: I have personally reviewed the radiological images as listed and agreed with the findings in the report. No results found.    Orders Placed This Encounter  Procedures     CT CHEST ABDOMEN PELVIS W CONTRAST    Standing Status:   Future    Standing Expiration Date:   02/19/2023    Order Specific Question:   Preferred imaging location?    Answer:   [facility]    Order Specific Question:   Release to patient    Answer:   Immediate    Order Specific Question:   Is Oral Contrast requested for this exam?    Answer:   Yes, Per Radiology protocol   All questions were answered. The patient knows to call the clinic with any problems, questions or concerns. No barriers to learning was detected. The total time spent in the appointment was 30 minutes.     Yan Feng, MD 02/18/2022   I, Katie Daubenspeck, am acting as scribe for Yan Feng, MD.   I have  reviewed the above documentation for accuracy and completeness, and I agree with the above.     

## 2022-02-18 NOTE — Patient Instructions (Signed)
[facility]  Discharge Instructions: Thank you for choosing [facility] to provide your oncology and hematology care.   If you have a lab appointment with the Cancer Center, please go directly to the Cancer Center and check in at the registration area.   Wear comfortable clothing and clothing appropriate for easy access to any Portacath or PICC line.   We strive to give you quality time with your provider. You may need to reschedule your appointment if you arrive late (15 or more minutes).  Arriving late affects you and other patients whose appointments are after yours.  Also, if you miss three or more appointments without notifying the office, you may be dismissed from the clinic at the provider's discretion.      For prescription refill requests, have your pharmacy contact our office and allow 72 hours for refills to be completed.    Today you received the following chemotherapy and/or immunotherapy agents: Keytruda.       To help prevent nausea and vomiting after your treatment, we encourage you to take your nausea medication as directed.  BELOW ARE SYMPTOMS THAT SHOULD BE REPORTED IMMEDIATELY: *FEVER GREATER THAN 100.4 F (38 C) OR HIGHER *CHILLS OR SWEATING *NAUSEA AND VOMITING THAT IS NOT CONTROLLED WITH YOUR NAUSEA MEDICATION *UNUSUAL SHORTNESS OF BREATH *UNUSUAL BRUISING OR BLEEDING *URINARY PROBLEMS (pain or burning when urinating, or frequent urination) *BOWEL PROBLEMS (unusual diarrhea, constipation, pain near the anus) TENDERNESS IN MOUTH AND THROAT WITH OR WITHOUT PRESENCE OF ULCERS (sore throat, sores in mouth, or a toothache) UNUSUAL RASH, SWELLING OR PAIN  UNUSUAL VAGINAL DISCHARGE OR ITCHING   Items with * indicate a potential emergency and should be followed up as soon as possible or go to the Emergency Department if any problems should occur.  Please show the CHEMOTHERAPY ALERT CARD or IMMUNOTHERAPY ALERT CARD at check-in to  the Emergency Department and triage nurse.  Should you have questions after your visit or need to cancel or reschedule your appointment, please contact [facility]  Dept: [phone]  and follow the prompts.  Office hours are 8:00 a.m. to 4:30 p.m. Monday - Friday. Please note that voicemails left after 4:00 p.m. may not be returned until the following business day.  We are closed weekends and major holidays. You have access to a nurse at all times for urgent questions. Please call the main number to the clinic Dept: [phone] and follow the prompts.   For any non-urgent questions, you may also contact your provider using MyChart. We now offer e-Visits for anyone 18 and older to request care online for non-urgent symptoms. For details visit [email or web address].   Also download the MyChart app! Go to the app store, search "MyChart", open the app, select [facility], and log in with your MyChart username and password.  Masks are optional in the cancer centers. If you would like for your care team to wear a mask while they are taking care of you, please let them know. You may have one support person who is at least 86 years old accompany you for your appointments. 

## 2022-02-19 DIAGNOSIS — M9702XD Periprosthetic fracture around internal prosthetic left hip joint, subsequent encounter: Secondary | ICD-10-CM | POA: Diagnosis not present

## 2022-02-19 DIAGNOSIS — M6281 Muscle weakness (generalized): Secondary | ICD-10-CM | POA: Diagnosis not present

## 2022-02-19 DIAGNOSIS — Z9181 History of falling: Secondary | ICD-10-CM | POA: Diagnosis not present

## 2022-02-19 DIAGNOSIS — R2681 Unsteadiness on feet: Secondary | ICD-10-CM | POA: Diagnosis not present

## 2022-02-19 DIAGNOSIS — S300XXD Contusion of lower back and pelvis, subsequent encounter: Secondary | ICD-10-CM | POA: Diagnosis not present

## 2022-02-20 LAB — T4: T4, Total: 5 ug/dL (ref 4.5–12.0)

## 2022-02-22 ENCOUNTER — Non-Acute Institutional Stay: Payer: Medicare Other | Admitting: Orthopedic Surgery

## 2022-02-22 ENCOUNTER — Encounter: Payer: Self-pay | Admitting: Orthopedic Surgery

## 2022-02-22 DIAGNOSIS — F320 Major depressive disorder, single episode, mild: Secondary | ICD-10-CM

## 2022-02-22 DIAGNOSIS — C184 Malignant neoplasm of transverse colon: Secondary | ICD-10-CM

## 2022-02-22 DIAGNOSIS — M9702XD Periprosthetic fracture around internal prosthetic left hip joint, subsequent encounter: Secondary | ICD-10-CM | POA: Diagnosis not present

## 2022-02-22 DIAGNOSIS — E871 Hypo-osmolality and hyponatremia: Secondary | ICD-10-CM | POA: Diagnosis not present

## 2022-02-22 DIAGNOSIS — Z9181 History of falling: Secondary | ICD-10-CM | POA: Diagnosis not present

## 2022-02-22 DIAGNOSIS — E43 Unspecified severe protein-calorie malnutrition: Secondary | ICD-10-CM | POA: Diagnosis not present

## 2022-02-22 DIAGNOSIS — S300XXD Contusion of lower back and pelvis, subsequent encounter: Secondary | ICD-10-CM | POA: Diagnosis not present

## 2022-02-22 DIAGNOSIS — R2681 Unsteadiness on feet: Secondary | ICD-10-CM | POA: Diagnosis not present

## 2022-02-22 DIAGNOSIS — M6281 Muscle weakness (generalized): Secondary | ICD-10-CM | POA: Diagnosis not present

## 2022-02-22 NOTE — Progress Notes (Signed)
Location:  [place] Room Number: 34/A Place of Service:  ALF [id number]) Provider:  Yvonna Alanis, NP  Virgie Dad, MD  Patient Care Team: Virgie Dad, MD as PCP - General (Internal Medicine) Leonie Man, MD as PCP - Cardiology (Cardiology) [name], MD as Consulting Physician (Hematology)  Extended Emergency Contact Information Primary Emergency Contact: Chrissie Noa of [place] Phone: [phone] Mobile Phone: [phone] Relation: Son  Code Status: DNR Goals of care: Advanced Directive information    02/17/2022   10:27 AM  Advanced Directives  Does Patient Have a Medical Advance Directive? Yes  Type of Advance Directive Living will;Out of facility DNR (pink MOST or yellow form)  Does patient want to make changes to medical advance directive? No - Patient declined     Chief Complaint  Patient presents with   Acute Visit    fatigue    HPI:  Pt is a 88 y.o. male seen today for acute visit due to fatigue.   He currently resides on the assisted living unit at [facility]. He reports increased fatigue x 3 days. He is followed by Dr. Burr Medico due to colon cancer. 08/24 he received Keytruda. Hgb 9.4, hct 27.3, ferritin 203, sodium 125, BUN/creat 34/1.22, GFR 55,albumin 3.5, AST/ALT 48/19, TSH 6.566, T4 5.0. He denies abdominal pain, N/V, diarrhea or constipation. Stools sometimes black. He is given oxycodone 5 mg prn for pain. Admits to having poor appetite. He has been trying to include more protein in diet. He is also receiving Boost Plus BID. Eating < 50% of most meals. Wife passed away 02/01/22. He started on Remeron 7.5 mg 08/07. He recently moved out of SNF and to [facility] 08/16. He reports he is still adjusting to living in [place]. Upset he was unable to move back to [place] apartment.    Past Medical History:  Diagnosis Date   Anemia    leakoppenia   BPH (benign prostatic hypertrophy)    Bullous pemphigoid    [name], March  2011, right forearm squamous cell carcinoma   Chronic anticoagulation    systemic   Colon polyp    transverse, 2002   History of peptic ulcer    remote, 3/95   Hx of actinic keratosis    Hx of basal cell carcinoma    Hx of squamous cell carcinoma of skin    Hyperlipidemia    Left inguinal hernia    Moderate aortic insufficiency 2009   audible aortic insufficiency on 1/09 echo   PAF (paroxysmal atrial fibrillation) ([place]) 01/17/2014   On Warfarin.   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 Jude mechanical valve   ORIF FEMUR FRACTURE Left 08/28/2020   Procedure: OPEN REDUCTION INTERNAL FIXATION (ORIF)  DISTAL FEMUR FRACTURE;  Surgeon: Rod Can, MD;  Location: [place];  Service: Orthopedics;  Laterality: Left;   PARTIAL COLECTOMY N/A 06/30/2021   Procedure: PARTIAL COLECTOMY;  Surgeon: Clovis Riley, MD;  Location: [place];  Service: General;  Laterality: N/A;   TOTAL HIP ARTHROPLASTY Right 10/12/2017   Procedure: RIGHT TOTAL HIP ARTHROPLASTY ANTERIOR APPROACH;  Surgeon: Gaynelle Arabian, MD;  [facility]: [facility];  Service: Orthopedics;  Laterality: Right;   TRANSTHORACIC ECHOCARDIOGRAM  12/2018   Unable to assess diastolic function because of A. fib. Normal RV function, but moderately elevated RVSP.  Severe biatrial  enlargement. S/P St Jude bileaflet mechanical MVR that appears to be functioning normally. Mitral valve regurgitation cannot assess due to mechanical valve shadowing. MV Mean grad: 7.0 mmHg MV Area (PHT): 3.38 cm (stable for valve).  Mild Ao Sclerosis, Mild-Mod AI   TRANSTHORACIC ECHOCARDIOGRAM  08/'17; 10/'18    a) Mild conc LVH. EF 55-60%. No RWMA. Mod AI. Mechanical MV prosthesis functioning properly. LAD dilation.;; b)  EF 55-60%.  Mo AI.  Bileaflet Saint Jude mechanical MV with no paravalvular leak.  Severe LA dilation.  Minimally elevated PAP    Allergies  Allergen Reactions   Flexeril [Cyclobenzaprine] Diarrhea    Outpatient Encounter Medications as of 02/22/2022  Medication Sig   acetaminophen (TYLENOL) 500 MG tablet Take 1,000 mg by mouth in the morning and at bedtime.   acetaminophen (TYLENOL) 500 MG tablet Take 1,000 mg by mouth 3 (three) times daily as needed.   bisoprolol (ZEBETA) 5 MG tablet Take 1 tablet (5 mg total) by mouth daily.   ferrous sulfate 325 (65 FE) MG tablet Take 1 tablet (325 mg total) by mouth daily with breakfast.   furosemide (LASIX) 20 MG tablet Take 20 mg by mouth 3 (three) times a week. Mon, Wed, Fri   furosemide (LASIX) 40 MG tablet Take 40 mg by mouth 4 (four) times a week. Sun, Tue, Thurs, Sat   isosorbide mononitrate (IMDUR) 30 MG 24 hr tablet Take 30 mg by mouth daily.   lactose free nutrition (BOOST) LIQD Take 237 mLs by mouth 3 (three) times daily between meals.   loperamide (IMODIUM A-D) 2 MG tablet Take 2 tablets (4 mg total) by mouth daily as needed for diarrhea or loose stools.   LORazepam (ATIVAN) 0.5 MG tablet Take 1 tablet (0.5 mg total) by mouth daily.   mirtazapine (REMERON) 15 MG tablet Take 15 mg by mouth at bedtime.   Multiple Vitamins-Minerals (MULTIVITAMIN PO) Take 1 tablet by mouth daily.   Multiple Vitamins-Minerals (PRESERVISION AREDS 2) CAPS Take 1 capsule by mouth daily.   omeprazole (PRILOSEC) 20 MG capsule Take 20 mg by mouth  daily.   oxyCODONE (ROXICODONE) 5 MG immediate release tablet Take 1 tablet (5 mg total) by mouth 2 (two) times daily as needed for severe pain or breakthrough pain.   polyethylene glycol (MIRALAX / GLYCOLAX) 17 g packet Take 17 g by mouth daily as needed for mild constipation.   senna (SENOKOT) 8.6 MG TABS tablet Take 2 tablets by mouth every evening.   triamcinolone ointment (KENALOG) 0.5 % Apply 1 Application topically 2 (two) times daily as needed.   warfarin (COUMADIN) 2.5 MG tablet Take 2.5 mg by mouth. Lisabeth Register  Thu, Sat   warfarin (COUMADIN) 5 MG tablet Take 5 mg by mouth 2 (two) times a week. Once A Day on Mon, Fri   zinc oxide 20 % ointment Apply 1 application. topically as needed for irritation.  No facility-administered encounter medications on file as of 02/22/2022.    Review of Systems  Constitutional:  Positive for fatigue. Negative for activity change, appetite change, chills and fever.  HENT:  Negative for congestion and sore throat.   Eyes:  Negative for visual disturbance.  Respiratory:  Negative for cough, shortness of breath and wheezing.   Cardiovascular:  Negative for chest pain and leg swelling.  Gastrointestinal:  Positive for blood in stool. Negative for abdominal distention, abdominal pain, constipation, diarrhea, nausea, rectal pain and vomiting.  Genitourinary:  Negative for dysuria, frequency and hematuria.  Musculoskeletal:  Positive for arthralgias and gait problem.  Skin:  Negative for wound.  Neurological:  Positive for tremors and weakness. Negative for dizziness and headaches.  Psychiatric/Behavioral:  Positive for dysphoric mood. Negative for confusion and sleep disturbance. The patient is nervous/anxious.     Immunization History  Administered Date(s) Administered   Influenza Split 02/27/2012, 03/28/2013, 04/10/2014, 05/02/2015, 04/20/2016, 03/28/2017, 03/28/2018, 04/11/2019   Influenza, High Dose Seasonal PF 04/20/2016   Influenza,inj,Quad  PF,6+ Mos 03/28/2018   Influenza-Unspecified 03/30/2017, 04/08/2020   Moderna SARS-COV2 Booster Vaccination 12/03/2020   Moderna Sars-Covid-2 Vaccination 07/02/2019, 07/30/2019, 05/12/2020   Pfizer Covid-19 Vaccine Bivalent Booster 57yr & up 04/15/2021   Pneumococcal Conjugate-13 06/10/2014   Pneumococcal Polysaccharide-23 02/02/2006   Pneumococcal-Unspecified 02/02/2006   Td 12/28/2002, 04/11/2014   Tdap 06/10/2014   Zoster, Live 03/29/2015, 11/28/2019, 01/28/2020   Zoster, Unspecified 05/02/2015   Pertinent  Health Maintenance Due  Topic Date Due   INFLUENZA VACCINE  01/26/2022      12/11/2021    1:00 PM 01/08/2022    2:40 PM 01/28/2022   11:00 AM 01/29/2022    2:15 PM 02/18/2022    2:27 PM  [place]in the past year?    1   Was there an injury with Fall?    1   Fall Risk Category Calculator    3   Fall Risk Category    High   Patient Fall Risk Level High fall risk High fall risk High fall risk High fall risk High fall risk  Patient at Risk for Falls Due to    History of fall(s);Impaired balance/gait;Impaired mobility   Fall risk Follow up    Falls evaluation completed;Education provided;Falls prevention discussed    Functional Status Survey:    Vitals:   02/22/22 1004  BP: (!) 148/82  Pulse: 78  Resp: 20  Temp: 98.1 F (36.7 C)  SpO2: 94%  Weight: 134 lb 12.8 oz (61.1 kg)  Height: '5\' 11"'$  (1.803 m)   Body mass index is 18.8 kg/m. Physical Exam Vitals reviewed.  Constitutional:      General: He is not in acute distress. HENT:     Head: Normocephalic.  Eyes:     General:        Right eye: No discharge.        Left eye: No discharge.  Cardiovascular:     Rate and Rhythm: Normal rate. Rhythm irregular.     Pulses: Normal pulses.     Heart sounds: Normal heart sounds.  Pulmonary:     Effort: Pulmonary effort is normal. No respiratory distress.     Breath sounds: Normal breath sounds. No wheezing.  Abdominal:     General: Bowel sounds are normal. There  is no distension.     Palpations: Abdomen is soft. There is no mass.     Tenderness: There is no abdominal tenderness. There is no guarding or  rebound.     Hernia: No hernia is present.  Musculoskeletal:     Cervical back: Neck supple.     Right lower leg: No edema.     Left lower leg: No edema.  Skin:    General: Skin is warm and dry.     Capillary Refill: Capillary refill takes less than 2 seconds.     Comments: Chronic venous insufficiency to BLE  Neurological:     General: No focal deficit present.     Mental Status: He is alert and oriented to person, place, and time.     Motor: Weakness present.     Gait: Gait abnormal.     Comments: Rolator/wheelchair  Psychiatric:        Mood and Affect: Mood normal.        Behavior: Behavior normal.     Labs reviewed: Recent Labs    07/06/21 0156 07/06/21 1216 11/13/21 0347 11/14/21 0331 11/16/21 0333 11/17/21 0311 11/20/21 0000 01/08/22 1415 01/28/22 0955 02/18/22 1407  NA  --    < > 134*   < > 136 135   < > 128* 130* 125*  K  --    < > 3.8   < > 4.6 4.9   < > 4.3 3.9 4.7  CL  --    < > 103   < > 105 106   < > 98 97* 95*  CO2  --    < > 26   < > 25 25   < > '25 26 25  '$ GLUCOSE  --    < > 97   < > 126* 97   < > 69* 150* 112*  BUN  --    < > 43*   < > 29* 34*   < > 33* 35* 34*  CREATININE  --    < > 1.60*   < > 1.22 1.20   < > 1.30* 1.24 1.22  CALCIUM  --    < > 8.2*   < > 8.6* 8.6*   < > 9.0 9.0 9.0  MG  --    < > 2.3  --  2.3 2.2  --   --   --   --   PHOS 3.0  --   --   --  2.7 2.7  --   --   --   --    < > = values in this interval not displayed.   Recent Labs    01/08/22 1415 01/28/22 0955 02/18/22 1407  AST 31 42* 48*  ALT '17 21 19  '$ ALKPHOS 64 61 56  BILITOT 0.6 0.6 0.6  PROT 7.1 7.5 6.6  ALBUMIN 3.6 3.9 3.5   Recent Labs    01/08/22 1415 01/28/22 0955 02/18/22 1407  WBC 4.4 4.1 4.5  NEUTROABS 3.1 3.4 3.5  HGB 9.4* 10.3* 9.4*  HCT 28.2* 30.5* 27.3*  MCV 96.6 94.7 92.2  PLT 192 173 170   Lab Results   Component Value Date   TSH 6.566 (H) 02/18/2022   No results found for: "HGBA1C" No results found for: "CHOL", "HDL", "LDLCALC", "LDLDIRECT", "TRIG", "CHOLHDL"  Significant Diagnostic Results in last 30 days:  No results found.  Assessment/Plan 1. Protein-calorie malnutrition, severe ([place]) - related to active colon cancer, reports fatigue - albumin 3.5 02/18/2022 - BMI 18.80 - recommend increasing calories in diet - start Boost Plus with vanilla ice cream po BID - Remeron started 08/07 - cont monthly weights  2.  Cancer of transverse colon ([place]) - followed by Dr. Burr Medico - on [place] - plan for CT in 5-6 weeks  3. Current mild episode of major depressive disorder without prior episode [facility]) - wife passed 12/2021 - no mood changes - cont Remeron 7.5 mg- consider increasing next month  4. Hyponatremia - Na 125 02/18/2022 - furosemide reduced to 20 mg daily - bmp 08/31  Family/ staff Communication: plan discussed with patient and nurse  Labs/tests ordered:  bmp 08/31

## 2022-02-23 ENCOUNTER — Non-Acute Institutional Stay: Payer: Medicare Other | Admitting: Adult Health

## 2022-02-23 ENCOUNTER — Encounter: Payer: Self-pay | Admitting: Adult Health

## 2022-02-23 ENCOUNTER — Telehealth: Payer: Self-pay | Admitting: *Deleted

## 2022-02-23 DIAGNOSIS — S300XXD Contusion of lower back and pelvis, subsequent encounter: Secondary | ICD-10-CM | POA: Diagnosis not present

## 2022-02-23 DIAGNOSIS — Z5181 Encounter for therapeutic drug level monitoring: Secondary | ICD-10-CM | POA: Diagnosis not present

## 2022-02-23 DIAGNOSIS — Z7901 Long term (current) use of anticoagulants: Secondary | ICD-10-CM

## 2022-02-23 DIAGNOSIS — R2681 Unsteadiness on feet: Secondary | ICD-10-CM | POA: Diagnosis not present

## 2022-02-23 DIAGNOSIS — R630 Anorexia: Secondary | ICD-10-CM

## 2022-02-23 DIAGNOSIS — Z9181 History of falling: Secondary | ICD-10-CM | POA: Diagnosis not present

## 2022-02-23 DIAGNOSIS — R1310 Dysphagia, unspecified: Secondary | ICD-10-CM | POA: Diagnosis not present

## 2022-02-23 DIAGNOSIS — E43 Unspecified severe protein-calorie malnutrition: Secondary | ICD-10-CM | POA: Diagnosis not present

## 2022-02-23 DIAGNOSIS — M6281 Muscle weakness (generalized): Secondary | ICD-10-CM | POA: Diagnosis not present

## 2022-02-23 DIAGNOSIS — M9702XD Periprosthetic fracture around internal prosthetic left hip joint, subsequent encounter: Secondary | ICD-10-CM | POA: Diagnosis not present

## 2022-02-23 NOTE — Telephone Encounter (Signed)
Patient son, Richard Spears, called and stated that patient was seen today and he wanted you to call him regarding patient's status.   Requesting a call [phone]

## 2022-02-23 NOTE — Progress Notes (Unsigned)
Location:  [place] Room Number: AL/34/A Place of Service:  ALF (13) Provider:  Durenda Age, DNP, FNP-BC  Patient Care Team: Virgie Dad, MD as PCP - General (Internal Medicine) Leonie Man, MD as PCP - Cardiology (Cardiology) [name], MD as Consulting Physician (Hematology)  Extended Emergency Contact Information Primary Emergency Contact: Chrissie Noa of [place] Phone: [phone] Mobile Phone: [phone] Relation: Son  Code Status:  DNR  Goals of care: Advanced Directive information    02/17/2022   10:27 AM  Advanced Directives  Does Patient Have a Medical Advance Directive? Yes  Type of Advance Directive Living will;Out of facility DNR (pink MOST or yellow form)  Does patient want to make changes to medical advance directive? No - Patient declined     Chief Complaint  Patient presents with  . Acute Visit    Patient is being seen for anxiety    HPI:  Pt is a 88 y.o. male seen today for medical management of chronic diseases.  ***   Past Medical History:  Diagnosis Date  . Anemia    leakoppenia  . BPH (benign prostatic hypertrophy)   . Bullous pemphigoid    [name], March 2011, right forearm squamous cell carcinoma  . Chronic anticoagulation    systemic  . Colon polyp    transverse, 2002  . History of peptic ulcer    remote, 3/95  . Hx of actinic keratosis   . Hx of basal cell carcinoma   . Hx of squamous cell carcinoma of skin   . Hyperlipidemia   . Left inguinal hernia   . Moderate aortic insufficiency 2009   audible aortic insufficiency on 1/09 echo  . PAF (paroxysmal atrial fibrillation) ([place]) 01/17/2014   On Warfarin.  . S/P mitral valve replacement with metallic valve 11/3014   INR goal 2.5-3.5, St Jude,   . 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 BIOPSY  06/27/2021    Procedure: BIOPSY;  Surgeon: Clarene Essex, MD;  Location: [place];  Service: Endoscopy;;  . COLONOSCOPY WITH PROPOFOL N/A 06/27/2021   Procedure: COLONOSCOPY WITH PROPOFOL;  Surgeon: Clarene Essex, MD;  Location: [facility];  Service: Endoscopy;  Laterality: N/A;  . Electrodesiccation and Curettage and Shave Biopsy Right    Right medial, anterio tibia: Well differentiated Squamous Cell  . hip replacements Left    10 years ago  . MITRAL VALVE REPLACEMENT  03/1996   St. Jude mechanical valve  . ORIF FEMUR FRACTURE Left 08/28/2020   Procedure: OPEN REDUCTION INTERNAL FIXATION (ORIF) DISTAL FEMUR FRACTURE;  Surgeon: Rod Can, MD;  Location: [place];  Service: Orthopedics;  Laterality: Left;  . PARTIAL COLECTOMY N/A 06/30/2021   Procedure: PARTIAL COLECTOMY;  Surgeon: Clovis Riley, MD;  Location: [place];  Service: General;  Laterality: N/A;  . TOTAL HIP ARTHROPLASTY Right 10/12/2017   Procedure: RIGHT TOTAL HIP ARTHROPLASTY ANTERIOR APPROACH;  Surgeon: Gaynelle Arabian, MD;  [facility];  Service: Orthopedics;  Laterality: Right;  . TRANSTHORACIC ECHOCARDIOGRAM  12/2018   Unable to assess diastolic function because of A. fib. Normal RV function, but moderately elevated RVSP.  Severe biatrial enlargement. S/P St Jude bileaflet mechanical MVR that appears to be functioning normally. Mitral valve regurgitation cannot assess due to mechanical valve shadowing. MV Mean grad: 7.0 mmHg MV Area (PHT): 3.38 cm (stable for valve).  Mild Ao Sclerosis, Mild-Mod AI  . TRANSTHORACIC ECHOCARDIOGRAM  08/'17; 10/'18    a) Mild conc LVH. EF 55-60%. No RWMA. Mod AI. Mechanical MV prosthesis functioning properly. LAD dilation.;; b)  EF 55-60%.  Mo AI.  Bileaflet Saint Jude mechanical MV with no paravalvular leak.  Severe LA dilation.  Minimally elevated PAP    Allergies  Allergen Reactions  . Flexeril [Cyclobenzaprine] Diarrhea    Outpatient Encounter Medications as of 02/23/2022  Medication Sig  .  acetaminophen (TYLENOL) 500 MG tablet Take 1,000 mg by mouth in the morning and at bedtime.  Marland Kitchen acetaminophen (TYLENOL) 500 MG tablet Take 1,000 mg by mouth 3 (three) times daily as needed.  . bisoprolol (ZEBETA) 5 MG tablet Take 1 tablet (5 mg total) by mouth daily.  . ferrous sulfate 325 (65 FE) MG tablet Take 1 tablet (325 mg total) by mouth daily with breakfast.  . furosemide (LASIX) 20 MG tablet Take 20 mg by mouth daily with breakfast.  . isosorbide mononitrate (IMDUR) 30 MG 24 hr tablet Take 30 mg by mouth daily.  Marland Kitchen lactose free nutrition (BOOST) LIQD Take 237 mLs by mouth 3 (three) times daily between meals.  Marland Kitchen loperamide (IMODIUM A-D) 2 MG tablet Take 2 tablets (4 mg total) by mouth daily as needed for diarrhea or loose stools.  Marland Kitchen LORazepam (ATIVAN) 0.5 MG tablet Take 1 tablet (0.5 mg total) by mouth daily.  . mirtazapine (REMERON) 15 MG tablet Take 15 mg by mouth at bedtime.  . Multiple Vitamins-Minerals (MULTIVITAMIN PO) Take 1 tablet by mouth daily.  . Multiple Vitamins-Minerals (PRESERVISION AREDS 2) CAPS Take 1 capsule by mouth daily.  Marland Kitchen omeprazole (PRILOSEC) 20 MG capsule Take 20 mg by mouth daily.  Marland Kitchen oxyCODONE (ROXICODONE) 5 MG immediate release tablet Take 1 tablet (5 mg total) by mouth 2 (two) times daily as needed for severe pain or breakthrough pain.  . polyethylene glycol (MIRALAX / GLYCOLAX) 17 g packet Take 17 g by mouth daily as needed for mild constipation.  . senna (SENOKOT) 8.6 MG TABS tablet Take 2 tablets by mouth every evening.  . triamcinolone ointment (KENALOG) 0.5 % Apply 1 Application topically 2 (two) times daily as needed.  . warfarin (COUMADIN) 2.5 MG tablet Take 2.5 mg by mouth. Lisabeth Register  Thu, Sat  . warfarin (COUMADIN) 5 MG tablet Take 5 mg by mouth 2 (two) times a week. Once A Day on Mon, Fri  . zinc oxide 20 % ointment Apply 1 application. topically as needed for irritation.   No facility-administered encounter medications on file as of 02/23/2022.     Review of Systems ***    Immunization History  Administered Date(s) Administered  . Influenza Split 02/27/2012, 03/28/2013, 04/10/2014, 05/02/2015, 04/20/2016, 03/28/2017, 03/28/2018, 04/11/2019  . Influenza, High Dose Seasonal PF 04/20/2016  . Influenza,inj,Quad PF,6+ Mos 03/28/2018  . Influenza-Unspecified 03/30/2017, 04/08/2020  . Moderna SARS-COV2 Booster Vaccination 12/03/2020  . Moderna Sars-Covid-2 Vaccination 07/02/2019, 07/30/2019, 05/12/2020  . Pension scheme manager 52yr & up 04/15/2021  . Pneumococcal Conjugate-13 06/10/2014  . Pneumococcal Polysaccharide-23 02/02/2006  . Pneumococcal-Unspecified 02/02/2006  . Td 12/28/2002, 04/11/2014  . Tdap 06/10/2014  . Zoster, Live 03/29/2015, 11/28/2019, 01/28/2020  . Zoster, Unspecified 05/02/2015   Pertinent  Health Maintenance Due  Topic Date Due  . INFLUENZA VACCINE  01/26/2022      12/11/2021    1:00 PM 01/08/2022    2:40 PM 01/28/2022   11:00 AM 01/29/2022    2:15 PM 02/18/2022  2:27 PM  Fall Risk  Falls in the past year?    1   Was there an injury with Fall?    1   Fall Risk Category Calculator    3   Fall Risk Category    High   Patient Fall Risk Level High fall risk High fall risk High fall risk High fall risk High fall risk  Patient at Risk for Falls Due to    History of fall(s);Impaired balance/gait;Impaired mobility   Fall risk Follow up    Falls evaluation completed;Education provided;Falls prevention discussed      Vitals:   02/23/22 1040  BP: (!) 148/82  Pulse: 78  Resp: 20  Temp: 98.1 F (36.7 C)  SpO2: 94%  Weight: 134 lb (60.8 kg)  Height: '5\' 11"'$  (1.803 m)   Body mass index is 18.69 kg/m.  Physical Exam     Labs reviewed: Recent Labs    07/06/21 0156 07/06/21 1216 11/13/21 0347 11/14/21 0331 11/16/21 0333 11/17/21 0311 11/20/21 0000 01/08/22 1415 01/28/22 0955 02/18/22 1407  NA  --    < > 134*   < > 136 135   < > 128* 130* 125*  K  --    < > 3.8   < >  4.6 4.9   < > 4.3 3.9 4.7  CL  --    < > 103   < > 105 106   < > 98 97* 95*  CO2  --    < > 26   < > 25 25   < > '25 26 25  '$ GLUCOSE  --    < > 97   < > 126* 97   < > 69* 150* 112*  BUN  --    < > 43*   < > 29* 34*   < > 33* 35* 34*  CREATININE  --    < > 1.60*   < > 1.22 1.20   < > 1.30* 1.24 1.22  CALCIUM  --    < > 8.2*   < > 8.6* 8.6*   < > 9.0 9.0 9.0  MG  --    < > 2.3  --  2.3 2.2  --   --   --   --   PHOS 3.0  --   --   --  2.7 2.7  --   --   --   --    < > = values in this interval not displayed.   Recent Labs    01/08/22 1415 01/28/22 0955 02/18/22 1407  AST 31 42* 48*  ALT '17 21 19  '$ ALKPHOS 64 61 56  BILITOT 0.6 0.6 0.6  PROT 7.1 7.5 6.6  ALBUMIN 3.6 3.9 3.5   Recent Labs    01/08/22 1415 01/28/22 0955 02/18/22 1407  WBC 4.4 4.1 4.5  NEUTROABS 3.1 3.4 3.5  HGB 9.4* 10.3* 9.4*  HCT 28.2* 30.5* 27.3*  MCV 96.6 94.7 92.2  PLT 192 173 170   Lab Results  Component Value Date   TSH 6.566 (H) 02/18/2022   No results found for: "HGBA1C" No results found for: "CHOL", "HDL", "LDLCALC", "LDLDIRECT", "TRIG", "CHOLHDL"  Significant Diagnostic Results in last 30 days:  No results found.  Assessment/Plan ***   Family/ staff Communication: Discussed plan of care with resident and charge nurse  Labs/tests ordered:     Durenda Age, DNP, MSN, FNP-BC [facility] [phone] (Monday-Friday 8:00 a.m. - 5:00  p.m.) [phone] (after hours)

## 2022-02-23 NOTE — Telephone Encounter (Signed)
Medina-Vargas, Monina C, NP  You 2 hours ago (12:56 PM)   Talked to son about his concerns.  -  will have ST evaluation for swallowing  -  dietary consult  - increase in Remeron dosage to 15 mg Q HS

## 2022-02-24 ENCOUNTER — Other Ambulatory Visit: Payer: Self-pay

## 2022-02-24 DIAGNOSIS — R2681 Unsteadiness on feet: Secondary | ICD-10-CM | POA: Diagnosis not present

## 2022-02-24 DIAGNOSIS — S300XXD Contusion of lower back and pelvis, subsequent encounter: Secondary | ICD-10-CM | POA: Diagnosis not present

## 2022-02-24 DIAGNOSIS — Z9181 History of falling: Secondary | ICD-10-CM | POA: Diagnosis not present

## 2022-02-24 DIAGNOSIS — M9702XD Periprosthetic fracture around internal prosthetic left hip joint, subsequent encounter: Secondary | ICD-10-CM | POA: Diagnosis not present

## 2022-02-24 DIAGNOSIS — M6281 Muscle weakness (generalized): Secondary | ICD-10-CM | POA: Diagnosis not present

## 2022-02-25 ENCOUNTER — Encounter: Payer: Self-pay | Admitting: Internal Medicine

## 2022-02-25 ENCOUNTER — Telehealth: Payer: Self-pay

## 2022-02-25 ENCOUNTER — Other Ambulatory Visit: Payer: Self-pay

## 2022-02-25 DIAGNOSIS — S300XXD Contusion of lower back and pelvis, subsequent encounter: Secondary | ICD-10-CM | POA: Diagnosis not present

## 2022-02-25 DIAGNOSIS — M6281 Muscle weakness (generalized): Secondary | ICD-10-CM | POA: Diagnosis not present

## 2022-02-25 DIAGNOSIS — M9702XD Periprosthetic fracture around internal prosthetic left hip joint, subsequent encounter: Secondary | ICD-10-CM | POA: Diagnosis not present

## 2022-02-25 DIAGNOSIS — R2681 Unsteadiness on feet: Secondary | ICD-10-CM | POA: Diagnosis not present

## 2022-02-25 DIAGNOSIS — I10 Essential (primary) hypertension: Secondary | ICD-10-CM | POA: Diagnosis not present

## 2022-02-25 DIAGNOSIS — Z9181 History of falling: Secondary | ICD-10-CM | POA: Diagnosis not present

## 2022-02-25 NOTE — Telephone Encounter (Signed)
Pt's son called wanting to know when the pt's CT Scan was going to be done and whether Dr. Burr Medico was going to keep the pt on Pembrolizumab.  Richard Spears also wanted to know this information because they are trying to make plans in relocating the pt closer to him in [place], [place].  Informed Richard Spears that Dr. Burr Medico placed the order for the CT Scan w/contrast with an expectation date of 03/23/2022 to be completed.  Richard Spears stated that the pt has a Pembrolizumab infusion on 03/11/2022 and wanted to know if Dr. Burr Medico wanted the CT Scan completed before the pt's infusion.  Informed Richard Spears that Dr. Burr Medico is aware of the pt's Pembrolizumab infusion scheduled and that she's is "OK" with the pt having the CT Scan after that date.  Richard Spears wanted to know if the pt has the contrast for the CT Scan.  Informed Richard Spears that normally Dr. Burr Medico gives the patients their contrast while they are in clinic when she orders the CT Scan.  [name] to contact the pt's nursing home to confirm that they have the contrast for him.  If not, Richard Spears will contact Dr. Ernestina Penna office and this RN will send the contrast with the patient at his infusion appt on 03/11/2022.  Richard Spears verbalized understanding and has no further questions or concerns at this time.    Sent staff message to Ross Ludwig for PA for CT CAP w/contrast so pt's CT can be scheduled and transportation & Nursing Home contacted.

## 2022-02-25 NOTE — Progress Notes (Signed)
A user error has taken place.

## 2022-02-26 DIAGNOSIS — M9702XD Periprosthetic fracture around internal prosthetic left hip joint, subsequent encounter: Secondary | ICD-10-CM | POA: Diagnosis not present

## 2022-02-26 DIAGNOSIS — R2681 Unsteadiness on feet: Secondary | ICD-10-CM | POA: Diagnosis not present

## 2022-02-26 DIAGNOSIS — R1310 Dysphagia, unspecified: Secondary | ICD-10-CM | POA: Diagnosis not present

## 2022-02-26 DIAGNOSIS — Z9181 History of falling: Secondary | ICD-10-CM | POA: Diagnosis not present

## 2022-02-26 DIAGNOSIS — S300XXD Contusion of lower back and pelvis, subsequent encounter: Secondary | ICD-10-CM | POA: Diagnosis not present

## 2022-02-26 DIAGNOSIS — M6281 Muscle weakness (generalized): Secondary | ICD-10-CM | POA: Diagnosis not present

## 2022-03-01 DIAGNOSIS — M9702XD Periprosthetic fracture around internal prosthetic left hip joint, subsequent encounter: Secondary | ICD-10-CM | POA: Diagnosis not present

## 2022-03-01 DIAGNOSIS — R1310 Dysphagia, unspecified: Secondary | ICD-10-CM | POA: Diagnosis not present

## 2022-03-01 DIAGNOSIS — R2681 Unsteadiness on feet: Secondary | ICD-10-CM | POA: Diagnosis not present

## 2022-03-01 DIAGNOSIS — S300XXD Contusion of lower back and pelvis, subsequent encounter: Secondary | ICD-10-CM | POA: Diagnosis not present

## 2022-03-01 DIAGNOSIS — M6281 Muscle weakness (generalized): Secondary | ICD-10-CM | POA: Diagnosis not present

## 2022-03-01 DIAGNOSIS — Z9181 History of falling: Secondary | ICD-10-CM | POA: Diagnosis not present

## 2022-03-02 DIAGNOSIS — R2681 Unsteadiness on feet: Secondary | ICD-10-CM | POA: Diagnosis not present

## 2022-03-02 DIAGNOSIS — S300XXD Contusion of lower back and pelvis, subsequent encounter: Secondary | ICD-10-CM | POA: Diagnosis not present

## 2022-03-02 DIAGNOSIS — M6281 Muscle weakness (generalized): Secondary | ICD-10-CM | POA: Diagnosis not present

## 2022-03-02 DIAGNOSIS — R1310 Dysphagia, unspecified: Secondary | ICD-10-CM | POA: Diagnosis not present

## 2022-03-02 DIAGNOSIS — Z9181 History of falling: Secondary | ICD-10-CM | POA: Diagnosis not present

## 2022-03-02 DIAGNOSIS — M9702XD Periprosthetic fracture around internal prosthetic left hip joint, subsequent encounter: Secondary | ICD-10-CM | POA: Diagnosis not present

## 2022-03-03 ENCOUNTER — Other Ambulatory Visit: Payer: Self-pay

## 2022-03-03 DIAGNOSIS — Z9181 History of falling: Secondary | ICD-10-CM | POA: Diagnosis not present

## 2022-03-03 DIAGNOSIS — R1310 Dysphagia, unspecified: Secondary | ICD-10-CM | POA: Diagnosis not present

## 2022-03-03 DIAGNOSIS — M6281 Muscle weakness (generalized): Secondary | ICD-10-CM | POA: Diagnosis not present

## 2022-03-03 DIAGNOSIS — M9702XD Periprosthetic fracture around internal prosthetic left hip joint, subsequent encounter: Secondary | ICD-10-CM | POA: Diagnosis not present

## 2022-03-03 DIAGNOSIS — S300XXD Contusion of lower back and pelvis, subsequent encounter: Secondary | ICD-10-CM | POA: Diagnosis not present

## 2022-03-03 DIAGNOSIS — R2681 Unsteadiness on feet: Secondary | ICD-10-CM | POA: Diagnosis not present

## 2022-03-04 ENCOUNTER — Non-Acute Institutional Stay: Payer: Medicare Other | Admitting: Internal Medicine

## 2022-03-04 ENCOUNTER — Other Ambulatory Visit: Payer: Self-pay

## 2022-03-04 ENCOUNTER — Encounter: Payer: Self-pay | Admitting: Internal Medicine

## 2022-03-04 DIAGNOSIS — M9702XD Periprosthetic fracture around internal prosthetic left hip joint, subsequent encounter: Secondary | ICD-10-CM | POA: Diagnosis not present

## 2022-03-04 DIAGNOSIS — Z952 Presence of prosthetic heart valve: Secondary | ICD-10-CM

## 2022-03-04 DIAGNOSIS — I10 Essential (primary) hypertension: Secondary | ICD-10-CM | POA: Diagnosis not present

## 2022-03-04 DIAGNOSIS — R600 Localized edema: Secondary | ICD-10-CM | POA: Diagnosis not present

## 2022-03-04 DIAGNOSIS — S300XXD Contusion of lower back and pelvis, subsequent encounter: Secondary | ICD-10-CM | POA: Diagnosis not present

## 2022-03-04 DIAGNOSIS — R630 Anorexia: Secondary | ICD-10-CM

## 2022-03-04 DIAGNOSIS — R1310 Dysphagia, unspecified: Secondary | ICD-10-CM | POA: Diagnosis not present

## 2022-03-04 DIAGNOSIS — M6281 Muscle weakness (generalized): Secondary | ICD-10-CM | POA: Diagnosis not present

## 2022-03-04 DIAGNOSIS — Z9181 History of falling: Secondary | ICD-10-CM | POA: Diagnosis not present

## 2022-03-04 DIAGNOSIS — E871 Hypo-osmolality and hyponatremia: Secondary | ICD-10-CM | POA: Diagnosis not present

## 2022-03-04 DIAGNOSIS — R2681 Unsteadiness on feet: Secondary | ICD-10-CM | POA: Diagnosis not present

## 2022-03-04 NOTE — Progress Notes (Signed)
Location:   [facility] Room Number: 34-A Place of Service:  [facility] [phone]) Provider:  Veleta Miners, MD    Patient Care Team: Richard Dad, MD as PCP - General (Internal Medicine) Leonie Man, MD as PCP - Cardiology (Cardiology) [name], MD as Consulting Physician (Hematology)  Extended Emergency Contact Information Primary Emergency Contact: Chrissie Noa of [place] Phone: [phone] Mobile Phone: [phone] Relation: Son  Code Status:  DNR Goals of care: Advanced Directive information    03/04/2022    2:18 PM  Advanced Directives  Does Patient Have a Medical Advance Directive? Yes  Type of Advance Directive Living will;Out of facility DNR (pink MOST or yellow form)  Does patient want to make changes to medical advance directive? No - Patient declined     Chief Complaint  Patient presents with   Acute Visit    Hypoatremia    HPI:  Pt is a 88 y.o. male seen today for an acute visit for Hyponatremia  Patient is AL   Patient has a history of PAF, mechanical mitral valve n Coumadin, BPH, previous history of right and left hip arthroplasty, hypertension   Recent Diagnosis of Colon Cancer with Metastatic to upper abdomen on Keytruda now for Palliative treatment. S/P Colectomy periprostatic fracture of left hip management Conservatively in 05/23  Seen today as his sodium was 126.  Per nurses c/o Weakness Also needing more help with his ADLS Was seen by Dr Burr Medico in 08/24 and Lasix reduced due to low sodium But today his Legs are more swollen No SOB or cough   It was   Wt Readings from Last 3 Encounters:  03/04/22 133 lb 6.4 oz (60.5 kg)  02/23/22 134 lb (60.8 kg)  02/22/22 134 lb 12.8 oz (61.1 kg)      Past Medical History:  Diagnosis Date   Anemia    leakoppenia   BPH (benign prostatic hypertrophy)    Bullous pemphigoid    Wilhemina Spears, March 2011, right forearm squamous cell carcinoma   Chronic  anticoagulation    systemic   Colon polyp    transverse, 2002   History of peptic ulcer    remote, 3/95   Hx of actinic keratosis    Hx of basal cell carcinoma    Hx of squamous cell carcinoma of skin    Hyperlipidemia    Left inguinal hernia    Moderate aortic insufficiency 2009   audible aortic insufficiency on 1/09 echo   PAF (paroxysmal atrial fibrillation) ([place]) 01/17/2014   On Warfarin.   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 Jude mechanical valve   ORIF FEMUR FRACTURE Left 08/28/2020   Procedure: OPEN REDUCTION INTERNAL FIXATION (ORIF) DISTAL FEMUR FRACTURE;  Surgeon: Rod Can,  MD;  Location: [facility];  Service: Orthopedics;  Laterality: Left;   PARTIAL COLECTOMY N/A 06/30/2021   Procedure: PARTIAL COLECTOMY;  Surgeon: Clovis Riley, MD;  Location: [facility];  Service: General;  Laterality: N/A;   TOTAL HIP ARTHROPLASTY Right 10/12/2017   Procedure: RIGHT TOTAL HIP ARTHROPLASTY ANTERIOR APPROACH;  Surgeon: Gaynelle Arabian, MD;  Location: [facility];  Service: Orthopedics;  Laterality: Right;   TRANSTHORACIC ECHOCARDIOGRAM  12/2018   Unable to assess diastolic function because of A. fib. Normal RV function, but moderately elevated RVSP.  Severe biatrial enlargement. S/P St Jude bileaflet mechanical MVR that  appears to be functioning normally. Mitral valve regurgitation cannot assess due to mechanical valve shadowing. MV Mean grad: 7.0 mmHg MV Area (PHT): 3.38 cm (stable for valve).  Mild Ao Sclerosis, Mild-Mod AI   TRANSTHORACIC ECHOCARDIOGRAM  08/'17; 10/'18    a) Mild conc LVH. EF 55-60%. No RWMA. Mod AI. Mechanical MV prosthesis functioning properly. LAD dilation.;; b)  EF 55-60%.  Mo AI.  Bileaflet Saint Jude mechanical MV with no paravalvular leak.  Severe LA dilation.  Minimally elevated PAP    Allergies  Allergen Reactions   Flexeril [Cyclobenzaprine] Diarrhea    Allergies as of 03/04/2022       Reactions   Flexeril [cyclobenzaprine] Diarrhea        Medication List        Accurate as of March 04, 2022  2:18 PM. If you have any questions, ask your nurse or doctor.          acetaminophen 500 MG tablet Commonly known as: TYLENOL Take 1,000 mg by mouth in the morning and at bedtime.   acetaminophen 500 MG tablet Commonly known as: TYLENOL Take 1,000 mg by mouth 3 (three) times daily as needed.   bisoprolol 5 MG tablet Commonly known as: ZEBETA Take 1 tablet (5 mg total) by mouth daily.   ferrous sulfate 325 (65 FE) MG tablet Take 1 tablet (325 mg total) by mouth daily with breakfast.   furosemide 20 MG tablet Commonly known as: LASIX Take 20 mg by mouth daily with breakfast.   isosorbide mononitrate 30 MG 24 hr tablet Commonly known as: IMDUR Take 30 mg by mouth daily.   lactose free nutrition Liqd Take 237 mLs by mouth 3 (three) times daily between meals.   loperamide 2 MG tablet Commonly known as: Imodium A-D Take 2 tablets (4 mg total) by mouth daily as needed for diarrhea or loose stools.   LORazepam 0.5 MG tablet Commonly known as: ATIVAN Take 1 tablet (0.5 mg total) by mouth daily.   mirtazapine 15 MG tablet Commonly known as: REMERON Take 15 mg by mouth at bedtime.   MULTIVITAMIN PO Take 1 tablet by mouth daily.   omeprazole 20 MG  capsule Commonly known as: PRILOSEC Take 20 mg by mouth daily.   oxyCODONE 5 MG immediate release tablet Commonly known as: Roxicodone Take 1 tablet (5 mg total) by mouth 2 (two) times daily as needed for severe pain or breakthrough pain.   polyethylene glycol 17 g packet Commonly known as: MIRALAX / GLYCOLAX Take 17 g by mouth daily as needed for mild constipation.   PreserVision AREDS 2 Caps Take 1 capsule by mouth daily.   senna 8.6 MG Tabs tablet Commonly known as: SENOKOT Take 2 tablets by mouth every evening.   triamcinolone ointment 0.5 % Commonly known as: KENALOG Apply 1 Application topically 2 (two) times daily as needed.   warfarin 2.5 MG  tablet Commonly known as: COUMADIN Take as directed by the anticoagulation clinic. If you are unsure how to take this medication, talk to your nurse or doctor. Original instructions: Take 2.5 mg by mouth as directed. Sunday, Monday, Tuesday, Thursday, Friday, and Saturday.   warfarin 5 MG tablet Commonly known as: COUMADIN Take as directed by the anticoagulation clinic. If you are unsure how to take this medication, talk to your nurse or doctor. Original instructions: Take 5 mg by mouth as directed. On Monday & Friday   zinc oxide 20 % ointment Apply 1 application. topically as needed for irritation.        Review of Systems  Constitutional:  Positive for activity change. Negative for appetite change and unexpected weight change.  HENT: Negative.    Respiratory:  Negative for cough and shortness of breath.   Cardiovascular:  Positive for leg swelling.  Gastrointestinal:  Negative for constipation.  Genitourinary:  Negative for frequency.  Musculoskeletal:  Positive for gait problem. Negative for arthralgias and myalgias.  Skin: Negative.  Negative for rash.  Neurological:  Positive for weakness. Negative for dizziness.  Psychiatric/Behavioral:  Negative for confusion and sleep disturbance.   All other systems reviewed  and are negative.   Immunization History  Administered Date(s) Administered   Influenza Split 02/27/2012, 03/28/2013, 04/10/2014, 05/02/2015, 04/20/2016, 03/28/2017, 03/28/2018, 04/11/2019   Influenza, High Dose Seasonal PF 04/20/2016   Influenza,inj,Quad PF,6+ Mos 03/28/2018   Influenza-Unspecified 03/30/2017, 04/08/2020   Moderna SARS-COV2 Booster Vaccination 12/03/2020   Moderna Sars-Covid-2 Vaccination 07/02/2019, 07/30/2019, 05/12/2020   Pfizer Covid-19 Vaccine Bivalent Booster 53yr & up 04/15/2021   Pneumococcal Conjugate-13 06/10/2014   Pneumococcal Polysaccharide-23 02/02/2006   Pneumococcal-Unspecified 02/02/2006   Td 12/28/2002, 04/11/2014   Tdap 06/10/2014   Zoster, Live 03/29/2015, 11/28/2019, 01/28/2020   Zoster, Unspecified 05/02/2015   Pertinent  Health Maintenance Due  Topic Date Due   INFLUENZA VACCINE  01/26/2022      12/11/2021    1:00 PM 01/08/2022    2:40 PM 01/28/2022   11:00 AM 01/29/2022    2:15 PM 02/18/2022    2:27 PM  FPetermanin the past year?    1   Was there an injury with Fall?    1   Fall Risk Category Calculator    3   Fall Risk Category    High   Patient Fall Risk Level High fall risk High fall risk High fall risk High fall risk High fall risk  Patient at Risk for Falls Due to    History of fall(s);Impaired balance/gait;Impaired mobility   Fall risk Follow up    Falls evaluation completed;Education provided;Falls prevention discussed    Functional Status Survey:    Vitals:   03/04/22 1411  BP: 138/74  Pulse: 91  Resp: 16  Temp: 97.9 F (36.6 C)  SpO2: 92%  Weight: 133 lb 6.4 oz (60.5 kg)  Height: '5\' 11"'$  (1.803 m)   Body mass index is 18.61 kg/m. Physical Exam Vitals reviewed.  Constitutional:      Appearance: Normal appearance.  HENT:     Head: Normocephalic.     Nose: Nose normal.     Mouth/Throat:     Mouth: Mucous membranes are moist.     Pharynx: Oropharynx is clear.  Eyes:     Pupils: Pupils are equal, round,  and reactive to light.  Cardiovascular:     Rate and Rhythm: Normal rate. Rhythm irregular.     Pulses: Normal pulses.  Heart sounds: Murmur heard.  Pulmonary:     Effort: Pulmonary effort is normal. No respiratory distress.     Breath sounds: Normal breath sounds. No rales.  Abdominal:     General: Abdomen is flat. Bowel sounds are normal.     Palpations: Abdomen is soft.  Musculoskeletal:        General: Swelling present.     Cervical back: Neck supple.  Skin:    General: Skin is warm.  Neurological:     General: No focal deficit present.     Mental Status: He is alert and oriented to person, place, and time.  Psychiatric:        Mood and Affect: Mood normal.        Thought Content: Thought content normal.     Labs reviewed: Recent Labs    07/06/21 0156 07/06/21 1216 11/13/21 0347 11/14/21 0331 11/16/21 0333 11/17/21 0311 11/20/21 0000 01/08/22 1415 01/28/22 0955 02/18/22 1407  NA  --    < > 134*   < > 136 135   < > 128* 130* 125*  K  --    < > 3.8   < > 4.6 4.9   < > 4.3 3.9 4.7  CL  --    < > 103   < > 105 106   < > 98 97* 95*  CO2  --    < > 26   < > 25 25   < > '25 26 25  '$ GLUCOSE  --    < > 97   < > 126* 97   < > 69* 150* 112*  BUN  --    < > 43*   < > 29* 34*   < > 33* 35* 34*  CREATININE  --    < > 1.60*   < > 1.22 1.20   < > 1.30* 1.24 1.22  CALCIUM  --    < > 8.2*   < > 8.6* 8.6*   < > 9.0 9.0 9.0  MG  --    < > 2.3  --  2.3 2.2  --   --   --   --   PHOS 3.0  --   --   --  2.7 2.7  --   --   --   --    < > = values in this interval not displayed.   Recent Labs    01/08/22 1415 01/28/22 0955 02/18/22 1407  AST 31 42* 48*  ALT '17 21 19  '$ ALKPHOS 64 61 56  BILITOT 0.6 0.6 0.6  PROT 7.1 7.5 6.6  ALBUMIN 3.6 3.9 3.5   Recent Labs    01/08/22 1415 01/28/22 0955 02/18/22 1407  WBC 4.4 4.1 4.5  NEUTROABS 3.1 3.4 3.5  HGB 9.4* 10.3* 9.4*  HCT 28.2* 30.5* 27.3*  MCV 96.6 94.7 92.2  PLT 192 173 170   Lab Results  Component Value Date   TSH  6.566 (H) 02/18/2022   No results found for: "HGBA1C" No results found for: "CHOL", "HDL", "LDLCALC", "LDLDIRECT", "TRIG", "CHOLHDL"  Significant Diagnostic Results in last 30 days:  No results found.  Assessment/Plan 1. Hyponatremia Cannot decrease his lasix as his Legs are swollen and skin very frail due to Chronic Venous changes Will start on Sodium Tab Repeat BMP in 1 week  2. Poor appetite On Remeron Weight stable  3. Bilateral leg edema On Lasix Lower dose  4. S/P MVR (mitral valve replacement) Coumadin Dose  adjusted INR goal 3-3.5   Family/ staff Communication:   Labs/tests ordered:

## 2022-03-05 DIAGNOSIS — R1310 Dysphagia, unspecified: Secondary | ICD-10-CM | POA: Diagnosis not present

## 2022-03-05 DIAGNOSIS — S300XXD Contusion of lower back and pelvis, subsequent encounter: Secondary | ICD-10-CM | POA: Diagnosis not present

## 2022-03-05 DIAGNOSIS — M6281 Muscle weakness (generalized): Secondary | ICD-10-CM | POA: Diagnosis not present

## 2022-03-05 DIAGNOSIS — M9702XD Periprosthetic fracture around internal prosthetic left hip joint, subsequent encounter: Secondary | ICD-10-CM | POA: Diagnosis not present

## 2022-03-05 DIAGNOSIS — Z9181 History of falling: Secondary | ICD-10-CM | POA: Diagnosis not present

## 2022-03-05 DIAGNOSIS — R2681 Unsteadiness on feet: Secondary | ICD-10-CM | POA: Diagnosis not present

## 2022-03-07 ENCOUNTER — Other Ambulatory Visit: Payer: Self-pay | Admitting: Hematology

## 2022-03-07 DIAGNOSIS — C184 Malignant neoplasm of transverse colon: Secondary | ICD-10-CM

## 2022-03-08 DIAGNOSIS — I10 Essential (primary) hypertension: Secondary | ICD-10-CM | POA: Diagnosis not present

## 2022-03-08 DIAGNOSIS — R1310 Dysphagia, unspecified: Secondary | ICD-10-CM | POA: Diagnosis not present

## 2022-03-08 DIAGNOSIS — M9702XD Periprosthetic fracture around internal prosthetic left hip joint, subsequent encounter: Secondary | ICD-10-CM | POA: Diagnosis not present

## 2022-03-08 DIAGNOSIS — M6281 Muscle weakness (generalized): Secondary | ICD-10-CM | POA: Diagnosis not present

## 2022-03-08 DIAGNOSIS — R2681 Unsteadiness on feet: Secondary | ICD-10-CM | POA: Diagnosis not present

## 2022-03-08 DIAGNOSIS — Z9181 History of falling: Secondary | ICD-10-CM | POA: Diagnosis not present

## 2022-03-08 DIAGNOSIS — S300XXD Contusion of lower back and pelvis, subsequent encounter: Secondary | ICD-10-CM | POA: Diagnosis not present

## 2022-03-09 ENCOUNTER — Non-Acute Institutional Stay: Payer: Medicare Other | Admitting: Orthopedic Surgery

## 2022-03-09 ENCOUNTER — Encounter: Payer: Self-pay | Admitting: Orthopedic Surgery

## 2022-03-09 DIAGNOSIS — E871 Hypo-osmolality and hyponatremia: Secondary | ICD-10-CM | POA: Diagnosis not present

## 2022-03-09 DIAGNOSIS — F320 Major depressive disorder, single episode, mild: Secondary | ICD-10-CM

## 2022-03-09 DIAGNOSIS — R630 Anorexia: Secondary | ICD-10-CM

## 2022-03-09 DIAGNOSIS — M9702XD Periprosthetic fracture around internal prosthetic left hip joint, subsequent encounter: Secondary | ICD-10-CM | POA: Diagnosis not present

## 2022-03-09 DIAGNOSIS — R600 Localized edema: Secondary | ICD-10-CM

## 2022-03-09 DIAGNOSIS — R2681 Unsteadiness on feet: Secondary | ICD-10-CM | POA: Diagnosis not present

## 2022-03-09 DIAGNOSIS — C184 Malignant neoplasm of transverse colon: Secondary | ICD-10-CM

## 2022-03-09 DIAGNOSIS — R1310 Dysphagia, unspecified: Secondary | ICD-10-CM | POA: Diagnosis not present

## 2022-03-09 DIAGNOSIS — S300XXD Contusion of lower back and pelvis, subsequent encounter: Secondary | ICD-10-CM | POA: Diagnosis not present

## 2022-03-09 DIAGNOSIS — M6281 Muscle weakness (generalized): Secondary | ICD-10-CM | POA: Diagnosis not present

## 2022-03-09 DIAGNOSIS — Z9181 History of falling: Secondary | ICD-10-CM | POA: Diagnosis not present

## 2022-03-09 NOTE — Progress Notes (Unsigned)
Location:   [facility] Room Number: 34-A Place of Service:  ALF [phone]) Provider:  Windell Moulding, NP  PCP: Virgie Dad, MD  Patient Care Team: Virgie Dad, MD as PCP - General (Internal Medicine) Leonie Man, MD as PCP - Cardiology (Cardiology) [name], MD as Consulting Physician (Hematology)  Extended Emergency Contact Information Primary Emergency Contact: Chrissie Noa of [place] Phone: [phone] Mobile Phone: [phone] Relation: Son  Code Status:  DNR Goals of care: Advanced Directive information    03/09/2022   11:26 AM  Advanced Directives  Does Patient Have a Medical Advance Directive? Yes  Type of Advance Directive Out of facility DNR (pink MOST or yellow form);Living will  Does patient want to make changes to medical advance directive? No - Patient declined     Chief Complaint  Patient presents with   Acute Visit    Hyponatremia.     HPI:  Pt is a 88 y.o. male seen today for an acute visit for hyponatremia.   09/11 Na+ 130> was 126 (09/07)> was 125 (08/24). Furosemide was reduced by oncology. He continues to have some BLE edema. 09/07 he was also started on sodium tablets. He reports less "fogginess" today. He has been drinking Gatorade daily. No increased confusion per nursing.   Still having some intermittent diarrhea. Has not taken imodium. Appetite still poor. Does well with drinking supplement.   08/29 Remeron increased to 15 mg, no mood changes.   Posterior bilateral thigh skin tears healing, bleeding has stopped. Remains on coumadin.   Past Medical History:  Diagnosis Date   Anemia    leakoppenia   BPH (benign prostatic hypertrophy)    Bullous pemphigoid    [name], March 2011, right forearm squamous cell carcinoma   Chronic anticoagulation    systemic   Colon polyp    transverse, 2002   History of peptic ulcer    remote, 3/95   Hx of actinic keratosis    Hx of basal cell  carcinoma    Hx of squamous cell carcinoma of skin    Hyperlipidemia    Left inguinal hernia    Moderate aortic insufficiency 2009   audible aortic insufficiency on 1/09 echo   PAF (paroxysmal atrial fibrillation) ([place]) 01/17/2014   On Warfarin.   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 Jude mechanical valve   ORIF FEMUR FRACTURE Left 08/28/2020   Procedure: OPEN REDUCTION INTERNAL FIXATION (ORIF) DISTAL FEMUR FRACTURE;  Surgeon: Rod Can, MD;  Location: [facility];  Service: Orthopedics;  Laterality: Left;   PARTIAL COLECTOMY N/A 06/30/2021   Procedure: PARTIAL COLECTOMY;  Surgeon: Clovis Riley, MD;  Location: [facility];  Service: General;  Laterality: N/A;   TOTAL HIP ARTHROPLASTY  Right 10/12/2017   Procedure: RIGHT TOTAL HIP ARTHROPLASTY ANTERIOR APPROACH;  Surgeon: Gaynelle Arabian, MD;  Location: [facility];  Service: Orthopedics;  Laterality: Right;   TRANSTHORACIC ECHOCARDIOGRAM  12/2018   Unable to assess diastolic function because of A. fib. Normal RV function, but moderately elevated RVSP.  Severe biatrial enlargement. S/P St Jude bileaflet mechanical MVR that appears to be functioning normally. Mitral valve regurgitation cannot assess due to mechanical valve shadowing. MV Mean grad: 7.0 mmHg MV Area (PHT): 3.38 cm  (stable for valve).  Mild Ao Sclerosis, Mild-Mod AI   TRANSTHORACIC ECHOCARDIOGRAM  08/'17; 10/'18    a) Mild conc LVH. EF 55-60%. No RWMA. Mod AI. Mechanical MV prosthesis functioning properly. LAD dilation.;; b)  EF 55-60%.  Mo AI.  Bileaflet Saint Jude mechanical MV with no paravalvular leak.  Severe LA dilation.  Minimally elevated PAP    Allergies  Allergen Reactions   Flexeril [Cyclobenzaprine] Diarrhea    Allergies as of 03/09/2022       Reactions   Flexeril [cyclobenzaprine] Diarrhea        Medication List        Accurate as of March 09, 2022 11:27 AM. If you have any questions, ask your nurse or doctor.          acetaminophen 500 MG tablet Commonly known as: TYLENOL Take 1,000 mg by mouth in the morning and at bedtime.   acetaminophen 500 MG tablet Commonly known as: TYLENOL Take 1,000 mg by mouth 3 (three) times daily as needed.   bisoprolol 5 MG tablet Commonly known as: ZEBETA Take 1 tablet (5 mg total) by mouth daily.   ferrous sulfate 325 (65 FE) MG tablet Take 1 tablet (325 mg total) by mouth daily with breakfast.   furosemide 20 MG tablet Commonly known as: LASIX Take 20 mg by mouth daily with breakfast.   isosorbide mononitrate 30 MG 24 hr tablet Commonly known as: IMDUR Take 30 mg by mouth daily.   lactose free nutrition Liqd Take 237 mLs by mouth 2 (two) times daily.   loperamide 2 MG tablet Commonly known as: Imodium A-D Take 2 tablets (4 mg total) by mouth daily as needed for diarrhea or loose stools.   LORazepam 0.5 MG tablet Commonly known as: ATIVAN Take 1 tablet (0.5 mg total) by mouth daily.   mirtazapine 15 MG tablet Commonly known as: REMERON Take 15 mg by mouth at bedtime.   MULTIVITAMIN PO Take 1 tablet by mouth daily.   omeprazole 20 MG capsule Commonly known as: PRILOSEC Take 20 mg by mouth daily.   oxyCODONE 5 MG immediate release tablet Commonly known as: Roxicodone Take 1 tablet (5 mg total) by mouth 2  (two) times daily as needed for severe pain or breakthrough pain.   polyethylene glycol 17 g packet Commonly known as: MIRALAX / GLYCOLAX Take 17 g by mouth daily as needed for mild constipation.   PreserVision AREDS 2 Caps Take 1 capsule by mouth daily.   senna 8.6 MG Tabs tablet Commonly known as: SENOKOT Take 2 tablets by mouth every evening.   sodium chloride 1 g tablet Take 1 g by mouth once.   triamcinolone ointment 0.5 % Commonly known as: KENALOG Apply 1 Application topically 2 (two) times daily as needed.   warfarin 2.5 MG tablet Commonly known as: COUMADIN Take as directed by the anticoagulation clinic. If you are unsure how to take this medication, talk to your nurse or doctor. Original instructions: Take 2.5 mg by  mouth as directed. Sunday, Monday, Tuesday, Thursday, Friday, and Saturday.   warfarin 5 MG tablet Commonly known as: COUMADIN Take as directed by the anticoagulation clinic. If you are unsure how to take this medication, talk to your nurse or doctor. Original instructions: Take 5 mg by mouth as directed. Wed only   zinc oxide 20 % ointment Apply 1 application. topically as needed for irritation.        Review of Systems  Constitutional:  Negative for activity change, appetite change, chills, fatigue and fever.  HENT:  Negative for congestion and trouble swallowing.   Eyes:  Negative for visual disturbance.  Respiratory:  Negative for cough, shortness of breath and wheezing.   Cardiovascular:  Positive for leg swelling. Negative for chest pain.  Gastrointestinal:  Positive for blood in stool, constipation and diarrhea. Negative for abdominal distention, abdominal pain, nausea and vomiting.  Genitourinary:  Negative for dysuria, frequency and hematuria.  Musculoskeletal:  Positive for gait problem.  Skin:  Positive for wound.  Neurological:  Positive for weakness. Negative for dizziness and light-headedness.  Psychiatric/Behavioral:  Positive for  dysphoric mood. Negative for confusion and sleep disturbance. The patient is not nervous/anxious.     Immunization History  Administered Date(s) Administered   Influenza Split 02/27/2012, 03/28/2013, 04/10/2014, 05/02/2015, 04/20/2016, 03/28/2017, 03/28/2018, 04/11/2019   Influenza, High Dose Seasonal PF 04/20/2016   Influenza,inj,Quad PF,6+ Mos 03/28/2018   Influenza-Unspecified 03/30/2017, 04/08/2020   Moderna SARS-COV2 Booster Vaccination 12/03/2020   Moderna Sars-Covid-2 Vaccination 07/02/2019, 07/30/2019, 05/12/2020   Pfizer Covid-19 Vaccine Bivalent Booster 12yr & up 04/15/2021   Pneumococcal Conjugate-13 06/10/2014   Pneumococcal Polysaccharide-23 02/02/2006   Pneumococcal-Unspecified 02/02/2006   Td 12/28/2002, 04/11/2014   Tdap 06/10/2014   Zoster, Live 03/29/2015, 11/28/2019, 01/28/2020   Zoster, Unspecified 05/02/2015   Pertinent  Health Maintenance Due  Topic Date Due   INFLUENZA VACCINE  01/26/2022      12/11/2021    1:00 PM 01/08/2022    2:40 PM 01/28/2022   11:00 AM 01/29/2022    2:15 PM 02/18/2022    2:27 PM  FGalenain the past year?    1   Was there an injury with Fall?    1   Fall Risk Category Calculator    3   Fall Risk Category    High   Patient Fall Risk Level High fall risk High fall risk High fall risk High fall risk High fall risk  Patient at Risk for Falls Due to    History of fall(s);Impaired balance/gait;Impaired mobility   Fall risk Follow up    Falls evaluation completed;Education provided;Falls prevention discussed    Functional Status Survey:    Vitals:   03/09/22 1122  BP: 138/74  Pulse: 91  Resp: 16  Temp: 97.9 F (36.6 C)  SpO2: 92%  Weight: 133 lb 6.4 oz (60.5 kg)  Height: '5\' 11"'$  (1.803 m)   Body mass index is 18.61 kg/m. Physical Exam Vitals reviewed.  Constitutional:      General: He is not in acute distress.    Appearance: He is underweight.  HENT:     Head: Normocephalic.  Eyes:     General:        Right eye:  No discharge.        Left eye: No discharge.  Cardiovascular:     Rate and Rhythm: Normal rate. Rhythm irregular.     Pulses: Normal pulses.     Heart sounds: Normal heart sounds.  Pulmonary:  Effort: Pulmonary effort is normal. No respiratory distress.     Breath sounds: Normal breath sounds. No wheezing.  Abdominal:     General: Bowel sounds are normal. There is no distension.     Palpations: Abdomen is soft.     Tenderness: There is no abdominal tenderness.  Musculoskeletal:     Cervical back: Neck supple.     Right lower leg: Edema present.     Left lower leg: Edema present.     Comments: BLE 1+ pitting  Skin:    General: Skin is warm and dry.     Capillary Refill: Capillary refill takes less than 2 seconds.     Comments: Posterior thigh skin tears, CDI, no drainage, surrounding skin intact  Neurological:     General: No focal deficit present.     Mental Status: He is alert and oriented to person, place, and time.     Motor: Weakness present.     Gait: Gait abnormal.     Comments: rolator  Psychiatric:        Mood and Affect: Mood normal.        Behavior: Behavior normal.     Labs reviewed: Recent Labs    07/06/21 0156 07/06/21 1216 11/13/21 0347 11/14/21 0331 11/16/21 0333 11/17/21 0311 11/20/21 0000 01/08/22 1415 01/28/22 0955 02/18/22 1407  NA  --    < > 134*   < > 136 135   < > 128* 130* 125*  K  --    < > 3.8   < > 4.6 4.9   < > 4.3 3.9 4.7  CL  --    < > 103   < > 105 106   < > 98 97* 95*  CO2  --    < > 26   < > 25 25   < > '25 26 25  '$ GLUCOSE  --    < > 97   < > 126* 97   < > 69* 150* 112*  BUN  --    < > 43*   < > 29* 34*   < > 33* 35* 34*  CREATININE  --    < > 1.60*   < > 1.22 1.20   < > 1.30* 1.24 1.22  CALCIUM  --    < > 8.2*   < > 8.6* 8.6*   < > 9.0 9.0 9.0  MG  --    < > 2.3  --  2.3 2.2  --   --   --   --   PHOS 3.0  --   --   --  2.7 2.7  --   --   --   --    < > = values in this interval not displayed.   Recent Labs    01/08/22 1415  01/28/22 0955 02/18/22 1407  AST 31 42* 48*  ALT '17 21 19  '$ ALKPHOS 64 61 56  BILITOT 0.6 0.6 0.6  PROT 7.1 7.5 6.6  ALBUMIN 3.6 3.9 3.5   Recent Labs    01/08/22 1415 01/28/22 0955 02/18/22 1407  WBC 4.4 4.1 4.5  NEUTROABS 3.1 3.4 3.5  HGB 9.4* 10.3* 9.4*  HCT 28.2* 30.5* 27.3*  MCV 96.6 94.7 92.2  PLT 192 173 170   Lab Results  Component Value Date   TSH 6.566 (H) 02/18/2022   No results found for: "HGBA1C" No results found for: "CHOL", "HDL", "LDLCALC", "LDLDIRECT", "TRIG", "CHOLHDL"  Significant Diagnostic Results in  last 30 days:  No results found.  Assessment/Plan 1. Hyponatremia - 09/11 Na+ 130> was 126 (09/07)> was 125 (08/24) - furosemide reduced by oncology - cognition improved - cont sodium tablets - 09/14 lab with oncology  2. Cancer of transverse colon ([place]) - followed by Dr. Burr Medico - on [place]  3. Poor appetite - ongoing - BMI 18.61 - cont Boost - cont Remeron - cont monthly weights  4. Bilateral leg edema - 1+ pitting edema - cont furosemide  5. Current mild episode of major depressive disorder without prior episode ([place]) - 08/29 Remeron increased to 15 mg - no changes in mood    Family/ staff Communication: plan discussed with patient and nurse  Labs/tests ordered: lab work with oncology 09/14

## 2022-03-10 DIAGNOSIS — R2681 Unsteadiness on feet: Secondary | ICD-10-CM | POA: Diagnosis not present

## 2022-03-10 DIAGNOSIS — M6281 Muscle weakness (generalized): Secondary | ICD-10-CM | POA: Diagnosis not present

## 2022-03-10 DIAGNOSIS — Z9181 History of falling: Secondary | ICD-10-CM | POA: Diagnosis not present

## 2022-03-10 DIAGNOSIS — R1310 Dysphagia, unspecified: Secondary | ICD-10-CM | POA: Diagnosis not present

## 2022-03-10 DIAGNOSIS — S300XXD Contusion of lower back and pelvis, subsequent encounter: Secondary | ICD-10-CM | POA: Diagnosis not present

## 2022-03-10 DIAGNOSIS — M9702XD Periprosthetic fracture around internal prosthetic left hip joint, subsequent encounter: Secondary | ICD-10-CM | POA: Diagnosis not present

## 2022-03-11 ENCOUNTER — Inpatient Hospital Stay: Payer: Medicare Other

## 2022-03-11 ENCOUNTER — Telehealth: Payer: Self-pay | Admitting: Hematology

## 2022-03-11 ENCOUNTER — Inpatient Hospital Stay: Payer: Medicare Other | Admitting: Hematology

## 2022-03-11 NOTE — Telephone Encounter (Signed)
R/s per 9/14 in basket, message has been left

## 2022-03-12 ENCOUNTER — Other Ambulatory Visit: Payer: Self-pay

## 2022-03-12 DIAGNOSIS — R2681 Unsteadiness on feet: Secondary | ICD-10-CM | POA: Diagnosis not present

## 2022-03-12 DIAGNOSIS — R1310 Dysphagia, unspecified: Secondary | ICD-10-CM | POA: Diagnosis not present

## 2022-03-12 DIAGNOSIS — M6281 Muscle weakness (generalized): Secondary | ICD-10-CM | POA: Diagnosis not present

## 2022-03-12 DIAGNOSIS — Z9181 History of falling: Secondary | ICD-10-CM | POA: Diagnosis not present

## 2022-03-12 DIAGNOSIS — S300XXD Contusion of lower back and pelvis, subsequent encounter: Secondary | ICD-10-CM | POA: Diagnosis not present

## 2022-03-12 DIAGNOSIS — M9702XD Periprosthetic fracture around internal prosthetic left hip joint, subsequent encounter: Secondary | ICD-10-CM | POA: Diagnosis not present

## 2022-03-12 IMAGING — CT CT CHEST W/O CM
2 of 4 series · 15 of 36 positions shown, 18 images · non-contrast
Comparison: No prior chest CT.

CLINICAL DATA: Colon cancer, staging.

EXAM:
CT CHEST WITHOUT CONTRAST
TECHNIQUE: Multidetector CT imaging of the chest was performed following the
standard protocol without IV contrast.

[Series 5: thorax 2.0 · axial · 0.82mm/px · z∈[+1160,+1506]mm · 12 of 195 slices shown, 15 images]
[im 11/195  mediastinal]
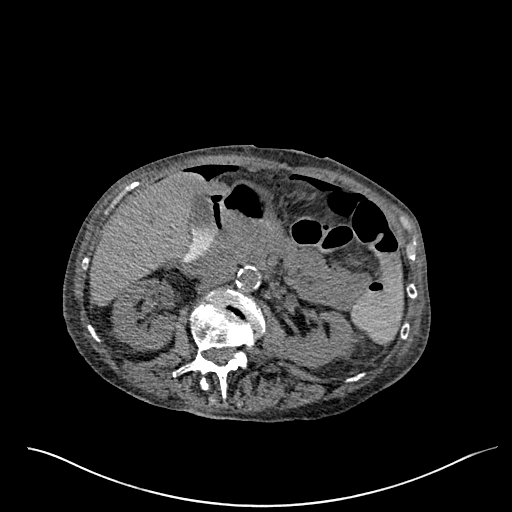
[im 11/195  lung]
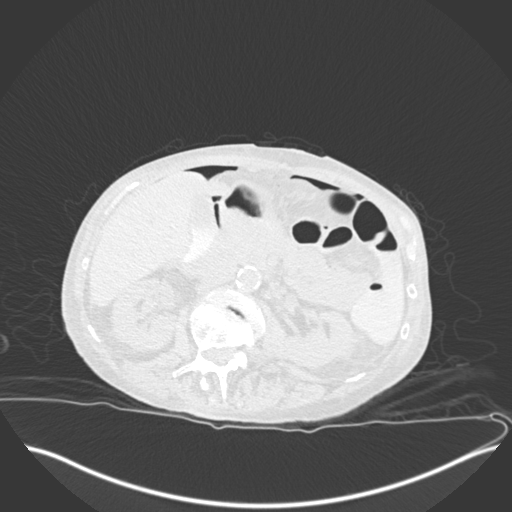
[im 31/195  lung]
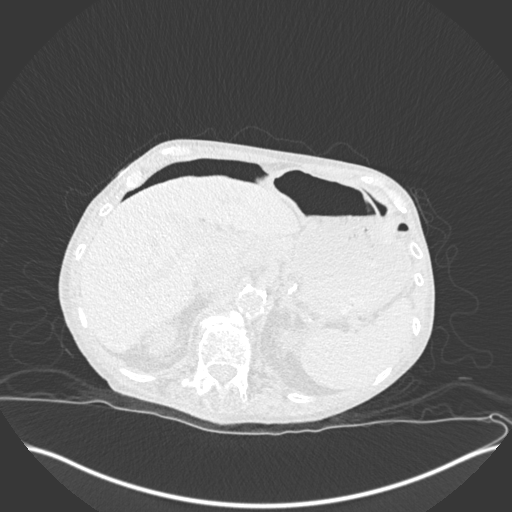
[im 41/195  lung]
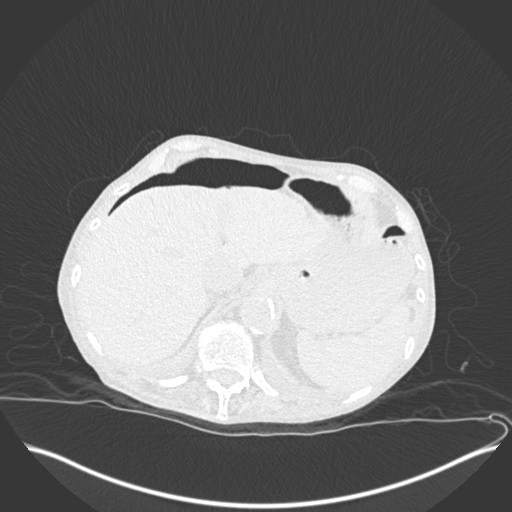
[im 62/195  lung]
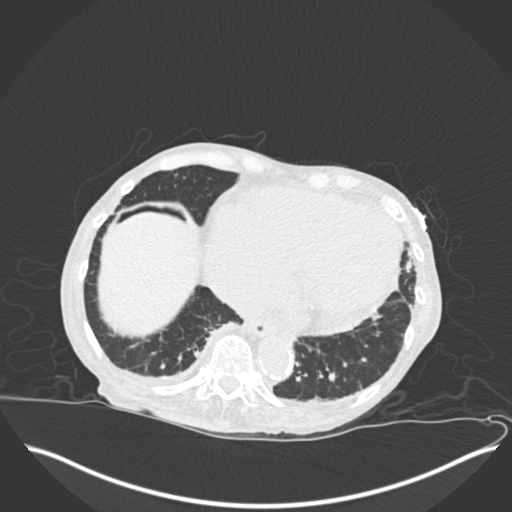
[im 72/195  mediastinal]
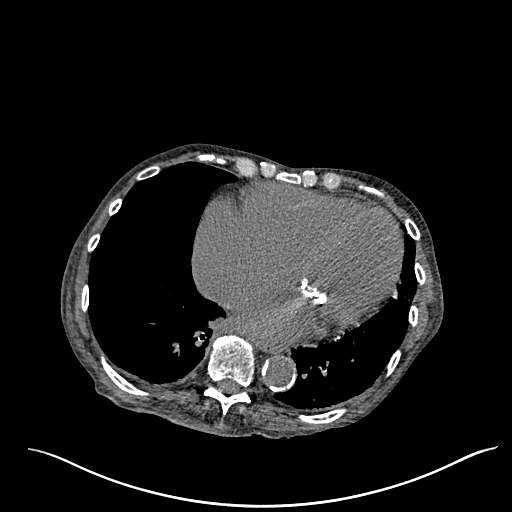
[im 72/195  lung]
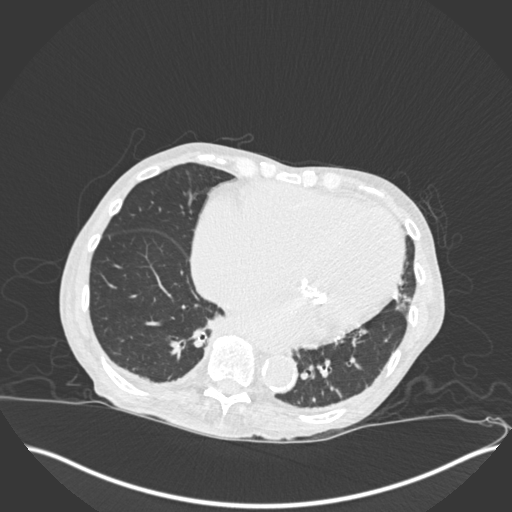
[im 92/195  lung]
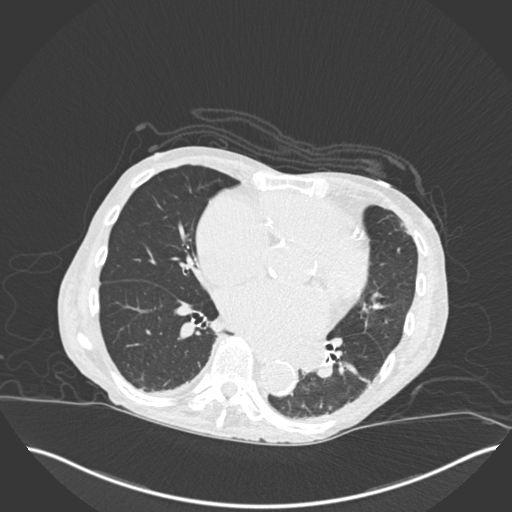
[im 103/195  lung]
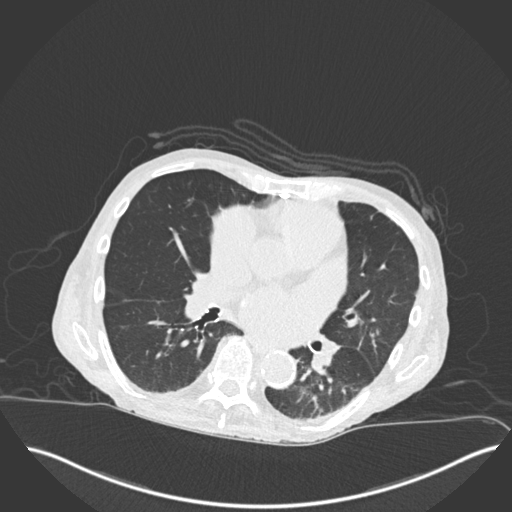
[im 123/195  lung]
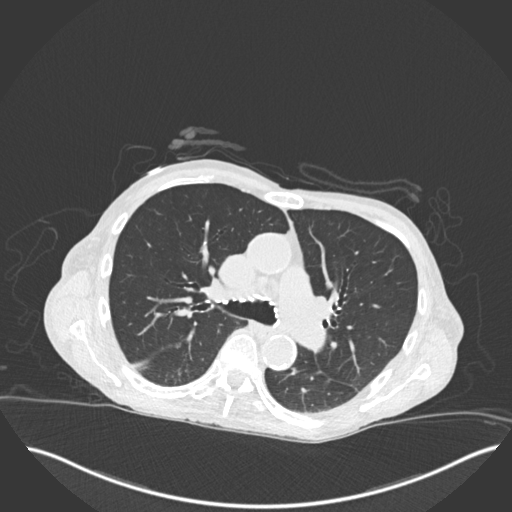
[im 133/195  mediastinal]
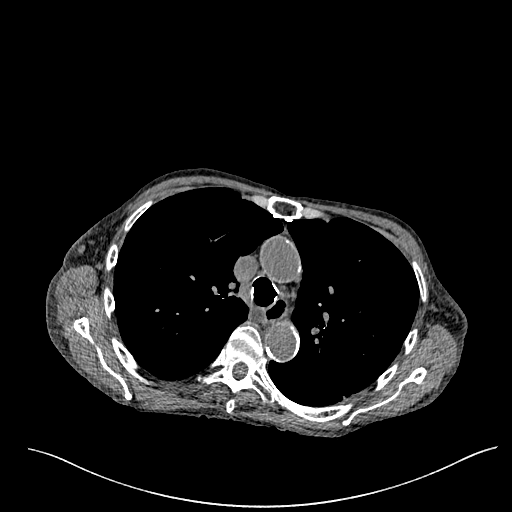
[im 133/195  lung]
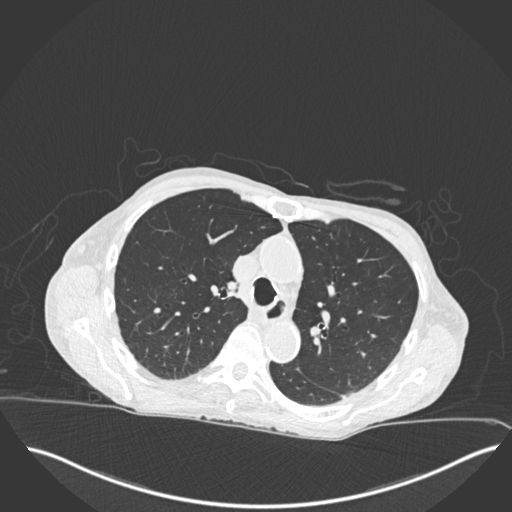
[im 154/195  lung]
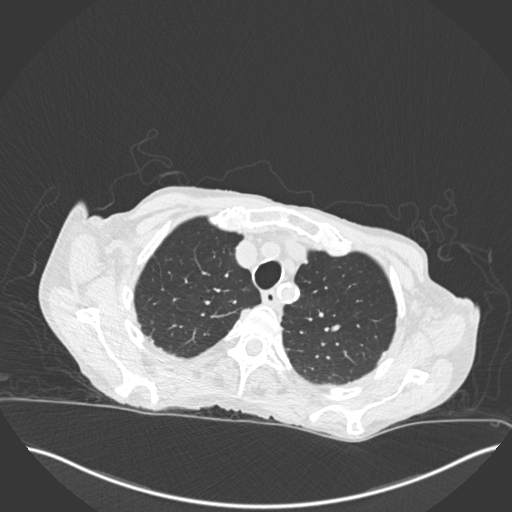
[im 164/195  lung]
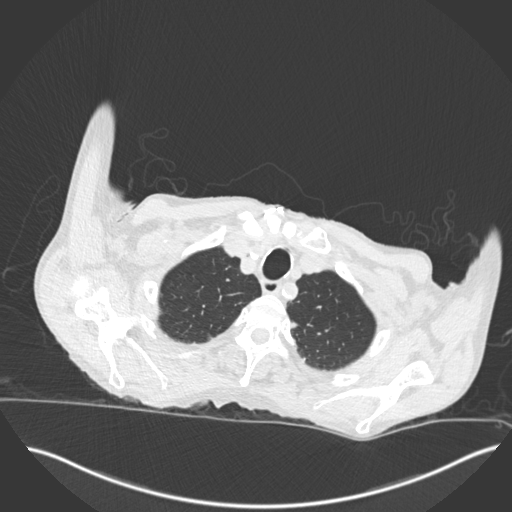
[im 184/195  lung]
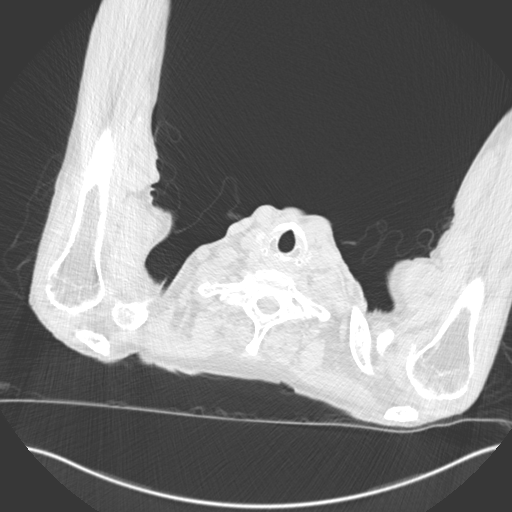

[Series 7: coronal · coronal · 0.76mm/px · 3 of 100 slices shown]
[im 20/100  lung]
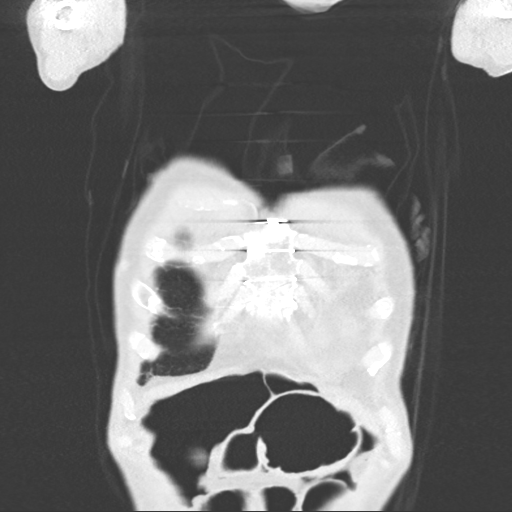
[im 40/100  lung]
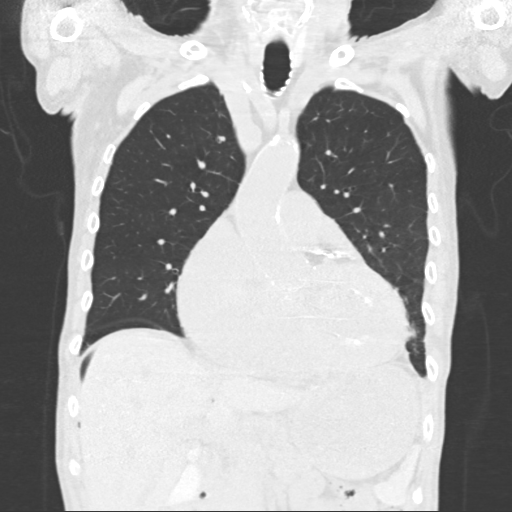
[im 60/100  lung]
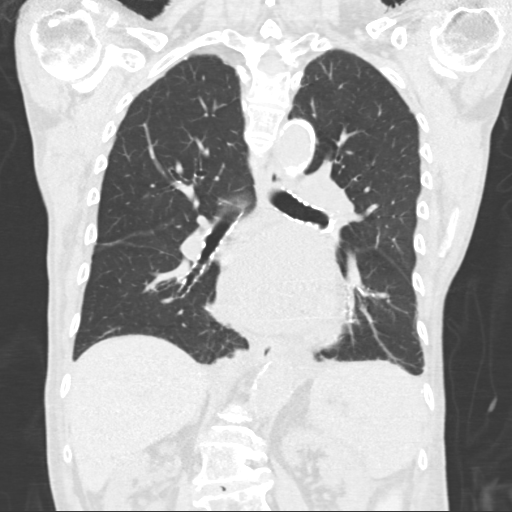

[15 of 36 positions shown; findings below may reference images not displayed]

FINDINGS: Cardiovascular: Advanced atherosclerosis of the thoracic aorta. No
aortic aneurysm. Descending aorta is tortuous. Multi chamber
cardiomegaly. Mitral valve replacement. There are coronary artery
calcifications. No pericardial effusion. Prominence of the central
pulmonary artery at 3.4 cm.

Mediastinum/Nodes: No enlarged mediastinal lymph nodes, paucity of
body and mediastinal fat limits detailed assessment. There is no
bulky hilar adenopathy on this unenhanced exam. No gross esophageal
wall thickening. Upper esophagus slightly patulous.

Lungs/Pleura: No pulmonary mass or suspicious nodule. Calcified
granuloma in the anterior subpleural right middle lobe. Trace
bilateral pleural effusions and associated atelectasis. Linear
subsegmental atelectasis in the lingula. No confluent consolidation.

Upper Abdomen: Free air in the upper abdomen likely related to
recent partial colectomy. High-density material in the gallbladder
was not seen on prior abdominal CT and likely reflect bones are
carious excretion of contrast.

Musculoskeletal: Prior median sternotomy. L1 inferior endplate
compression fracture. No blastic or destructive lytic lesion.
Generalized paucity of body fat.
IMPRESSION: 1. No evidence of metastatic disease in the thorax.
2. Trace bilateral pleural effusions and associated atelectasis.
3. Multi chamber cardiomegaly. Coronary artery calcifications.
Prominence of the central pulmonary artery suggesting pulmonary
arterial hypertension.

Aortic Atherosclerosis ([id number].R).

## 2022-03-15 DIAGNOSIS — M6281 Muscle weakness (generalized): Secondary | ICD-10-CM | POA: Diagnosis not present

## 2022-03-15 DIAGNOSIS — S300XXD Contusion of lower back and pelvis, subsequent encounter: Secondary | ICD-10-CM | POA: Diagnosis not present

## 2022-03-15 DIAGNOSIS — R2681 Unsteadiness on feet: Secondary | ICD-10-CM | POA: Diagnosis not present

## 2022-03-15 DIAGNOSIS — M9702XD Periprosthetic fracture around internal prosthetic left hip joint, subsequent encounter: Secondary | ICD-10-CM | POA: Diagnosis not present

## 2022-03-15 DIAGNOSIS — Z9181 History of falling: Secondary | ICD-10-CM | POA: Diagnosis not present

## 2022-03-15 DIAGNOSIS — R1310 Dysphagia, unspecified: Secondary | ICD-10-CM | POA: Diagnosis not present

## 2022-03-16 ENCOUNTER — Other Ambulatory Visit: Payer: Self-pay | Admitting: Orthopedic Surgery

## 2022-03-16 ENCOUNTER — Ambulatory Visit (HOSPITAL_COMMUNITY)
Admission: RE | Admit: 2022-03-16 | Discharge: 2022-03-16 | Disposition: A | Discharge status: Home / Self Care | Payer: Medicare Other | Source: Ambulatory Visit | Attending: Hematology | Admitting: Hematology

## 2022-03-16 DIAGNOSIS — M6281 Muscle weakness (generalized): Secondary | ICD-10-CM | POA: Diagnosis not present

## 2022-03-16 DIAGNOSIS — Z9181 History of falling: Secondary | ICD-10-CM | POA: Diagnosis not present

## 2022-03-16 DIAGNOSIS — S300XXD Contusion of lower back and pelvis, subsequent encounter: Secondary | ICD-10-CM | POA: Diagnosis not present

## 2022-03-16 DIAGNOSIS — R1310 Dysphagia, unspecified: Secondary | ICD-10-CM | POA: Diagnosis not present

## 2022-03-16 DIAGNOSIS — J9811 Atelectasis: Secondary | ICD-10-CM | POA: Diagnosis not present

## 2022-03-16 DIAGNOSIS — C184 Malignant neoplasm of transverse colon: Secondary | ICD-10-CM | POA: Insufficient documentation

## 2022-03-16 DIAGNOSIS — K7689 Other specified diseases of liver: Secondary | ICD-10-CM | POA: Diagnosis not present

## 2022-03-16 DIAGNOSIS — R188 Other ascites: Secondary | ICD-10-CM | POA: Diagnosis not present

## 2022-03-16 DIAGNOSIS — J984 Other disorders of lung: Secondary | ICD-10-CM | POA: Diagnosis not present

## 2022-03-16 DIAGNOSIS — S22070A Wedge compression fracture of T9-T10 vertebra, initial encounter for closed fracture: Secondary | ICD-10-CM | POA: Diagnosis not present

## 2022-03-16 DIAGNOSIS — M9702XD Periprosthetic fracture around internal prosthetic left hip joint, subsequent encounter: Secondary | ICD-10-CM | POA: Diagnosis not present

## 2022-03-16 DIAGNOSIS — S22080A Wedge compression fracture of T11-T12 vertebra, initial encounter for closed fracture: Secondary | ICD-10-CM | POA: Diagnosis not present

## 2022-03-16 DIAGNOSIS — K4090 Unilateral inguinal hernia, without obstruction or gangrene, not specified as recurrent: Secondary | ICD-10-CM | POA: Diagnosis not present

## 2022-03-16 DIAGNOSIS — R2681 Unsteadiness on feet: Secondary | ICD-10-CM | POA: Diagnosis not present

## 2022-03-16 MED ORDER — IOHEXOL 300 MG/ML  SOLN
80.0000 mL | Freq: Once | INTRAMUSCULAR | Status: AC | PRN
  Administered 2022-03-16: 80 mL via INTRAVENOUS

## 2022-03-16 MED ORDER — OXYCODONE HCL 5 MG PO TABS: 5.0000 mg | ORAL_TABLET | Freq: Two times a day (BID) | ORAL | 0 refills | Status: AC | PRN

## 2022-03-16 MED ORDER — SODIUM CHLORIDE (PF) 0.9 % IJ SOLN
INTRAMUSCULAR | Status: AC
  Filled 2022-03-16: qty 50

## 2022-03-17 ENCOUNTER — Telehealth: Payer: Self-pay

## 2022-03-17 DIAGNOSIS — M6281 Muscle weakness (generalized): Secondary | ICD-10-CM | POA: Diagnosis not present

## 2022-03-17 DIAGNOSIS — R1310 Dysphagia, unspecified: Secondary | ICD-10-CM | POA: Diagnosis not present

## 2022-03-17 DIAGNOSIS — S300XXD Contusion of lower back and pelvis, subsequent encounter: Secondary | ICD-10-CM | POA: Diagnosis not present

## 2022-03-17 DIAGNOSIS — R2681 Unsteadiness on feet: Secondary | ICD-10-CM | POA: Diagnosis not present

## 2022-03-17 DIAGNOSIS — M9702XD Periprosthetic fracture around internal prosthetic left hip joint, subsequent encounter: Secondary | ICD-10-CM | POA: Diagnosis not present

## 2022-03-17 DIAGNOSIS — Z9181 History of falling: Secondary | ICD-10-CM | POA: Diagnosis not present

## 2022-03-17 NOTE — Telephone Encounter (Signed)
Pt's son Alferd Apa called stated that his dad has completed his CT Scan and has a telephone f/u app scheduled with Dr. Burr Medico.  Alferd Apa stated he unfortunately will not be in town but would like for Dr. Burr Medico to call his cellphone [phone]) and he will connect his father into the conference call with Dr. Burr Medico.  Alferd Apa stated if Dr. Burr Medico calls the pt first unfortunately the pt will not be able to conference Alferd Apa into the call.  Notified Dr. Burr Medico of the pt's son's request.

## 2022-03-18 ENCOUNTER — Encounter: Payer: Self-pay | Admitting: Hematology

## 2022-03-18 ENCOUNTER — Inpatient Hospital Stay: Payer: Medicare Other | Attending: Hematology | Admitting: Hematology

## 2022-03-18 DIAGNOSIS — C184 Malignant neoplasm of transverse colon: Secondary | ICD-10-CM | POA: Diagnosis not present

## 2022-03-18 DIAGNOSIS — R2681 Unsteadiness on feet: Secondary | ICD-10-CM | POA: Diagnosis not present

## 2022-03-18 DIAGNOSIS — R1310 Dysphagia, unspecified: Secondary | ICD-10-CM | POA: Diagnosis not present

## 2022-03-18 DIAGNOSIS — S300XXD Contusion of lower back and pelvis, subsequent encounter: Secondary | ICD-10-CM | POA: Diagnosis not present

## 2022-03-18 DIAGNOSIS — M6281 Muscle weakness (generalized): Secondary | ICD-10-CM | POA: Diagnosis not present

## 2022-03-18 DIAGNOSIS — Z9181 History of falling: Secondary | ICD-10-CM | POA: Diagnosis not present

## 2022-03-18 DIAGNOSIS — M9702XD Periprosthetic fracture around internal prosthetic left hip joint, subsequent encounter: Secondary | ICD-10-CM | POA: Diagnosis not present

## 2022-03-18 NOTE — Progress Notes (Signed)
[place]   Telephone:([phone]) [phone] Fax:(336) [phone]   Clinic Follow up Note   Patient Care Team: Virgie Dad, MD as PCP - General (Internal Medicine) Leonie Man, MD as PCP - Cardiology (Cardiology) [name], MD as Consulting Physician (Hematology)  Date of Service:  03/18/2022  I connected with Richard Spears on 03/18/2022 at  3:45 PM EDT by telephone visit and verified that I am speaking with the correct person using two identifiers.  I discussed the limitations, risks, security and privacy concerns of performing an evaluation and management service by telephone and the availability of in person appointments. I also discussed with the patient that there may be a patient responsible charge related to this service. The patient expressed understanding and agreed to proceed.   Other persons participating in the visit and their role in the encounter:  pt's son, Alferd Apa  Patient's location:  home Provider's location:  my office  CHIEF COMPLAINT: review recent scan results, f/u of colon cancer  CURRENT THERAPY:  [facility], starting 10/07/21  ASSESSMENT & PLAN:  Richard Spears is a 88 y.o. male with   1. Cancer of transverse colon, stage IIIa pT2N1cM0, loss of MLH1 and PMS2, nodal recurrence in 08/2021  -presented to ED on 06/24/21 with hematochezia and rectal bleeding. Work up CT showed mass-like filling defect within mid transverse colon. Inpatient colonoscopy on 06/27/21 under Dr. Watt Climes showed a large partially-obstructing transverse colon mass. Pathology confirmed poorly differentiated adenocarcinoma, MMR abnormal with loss of expression in MLH1 and PMS2, MLH1 hypermethylation positive, this is likely sporadic colon cancer. -partial colectomy on 06/30/21 under Dr. Windle Guard showed 5 cm poorly differentiated adenocarcinoma invading muscularis propria. He had one tumor deposit  -baseline CEA on 09/09/21 slightly elevated to 6.84. -staging PET on  10/16/21 showed: intense multifocal hypermetabolic nodal metastases; no definite activity at colonic anastomosis or distant metastasis. -he began [place] on 10/07/21, held briefly following fall on 11/12/21. -restaging CT CAP on 03/16/22 showed: since 01/01/22, markedly worsened bulky mass/adenopathy in gastrohepatic ligament region, with adjacent gastric wall thickening; worsening T11 compression fracture. Other findings are stable. -I reviewed the results with them today.  Patient's overall condition has deteriorated lately, he is not a candidate for chemo.  I will stop Keytruda due to his cancer progression.  I recommend palliative care and hospice.  I reviewed the logistics of hospice with patient and his son in detail, they are very familiar with [facility] because his wife was under their care early this year. He has decided to move to [place] to be closer to his son.  He not sure if he wants to hospice service here in [place], or wait until he moved to [place]. He will call me if needed.      PLAN: -lab and CT scan results discussed with patient and his son, unfortunately his cancer has progressed significantly.  We will stop Keytruda. -I recommend hospice service, he is open to it.  He is not sure if he wants to enroll now, or until he moves to [place] to be closer to his son.  He will call me if needed. -f/u open    No problem-specific Assessment & Plan notes found for this encounter.   SUMMARY OF ONCOLOGIC HISTORY: Oncology History Overview Note   Cancer Staging  Cancer of transverse colon  Staging form: Colon and Rectum, AJCC 8th Edition - Pathologic stage from 06/30/2021: Stage I (pT2, pN0, cM0) - Signed by [name], MD on  07/03/2021    Cancer of transverse colon   06/24/2021 Imaging   EXAM: CTA ABDOMEN AND PELVIS WITHOUT AND WITH CONTRAST  MPRESSION: 1. Avid arterial phase enhancing masslike filling defect within the mid transverse colon is identified. Although  conceivably this could represent a large blood clot the diagnosis of exclusion is a primary colonic neoplasm. Further evaluation with colonoscopy is recommended. 2. Extensive atherosclerotic disease noted with stenosis noted at the origin of the celiac artery, SMA, IMA and both renal arteries. 3. 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 4. Large left inguinal hernia contains a nonobstructed loop of large bowel. 5. Aortic Atherosclerosis (ICD10-I70.0).   06/27/2021 Procedure   Colonoscopy, Dr. Watt Climes  Impression: - Likely malignant partially obstructing tumor in the mid transverse colon. Biopsied. - The examination was otherwise normal on direct and retroflexion views. Unable to advance the scope past the tumor   06/27/2021 Initial Biopsy   FINAL MICROSCOPIC DIAGNOSIS:   A. TRANSVERSE COLON, BIOPSY:  - Poorly differentiated adenocarcinoma, see comment    ADDENDUM:  Mismatch Repair Protein (IHC)  SUMMARY INTERPRETATION: ABNORMAL   IHC EXPRESSION RESULTS   TEST           RESULT  MLH1:          LOSS OF NUCLEAR EXPRESSION  MSH2:          Preserved nuclear expression  MSH6:          Preserved nuclear expression  PMS2:          LOSS OF NUCLEAR EXPRESSION    06/30/2021 Cancer Staging   Staging form: Colon and Rectum, AJCC 8th Edition - Pathologic stage from 06/30/2021: Stage IIIA (pT2, pN1c, cM0) - Signed by [name], MD on 10/07/2021 Stage prefix: Initial diagnosis Total positive nodes: 0 Residual tumor (R): R0 - None   06/30/2021 Definitive Surgery   FINAL MICROSCOPIC DIAGNOSIS:   A. COLON, TRANSVERSE, PARTIAL COLECTOMY:  Poorly differentiated adenocarcinoma invading muscularis propria.  See oncology table for details.    07/02/2021 Imaging   EXAM: CT CHEST WITHOUT CONTRAST  IMPRESSION: 1. No evidence of metastatic disease in the thorax. 2. Trace bilateral pleural effusions and associated atelectasis. 3. Multi chamber cardiomegaly. Coronary  artery calcifications. Prominence of the central pulmonary artery suggesting pulmonary arterial hypertension.   07/03/2021 Initial Diagnosis   Cancer of transverse colon ([place])   10/07/2021 - 02/18/2022 Chemotherapy   Patient is on Treatment Plan : COLORECTAL Pembrolizumab (200) q21d     10/07/2021 -  Chemotherapy   Patient is on Treatment Plan : COLORECTAL Pembrolizumab (200) q21d     10/16/2021 PET scan   IMPRESSION: 1. Intense multifocal hypermetabolic nodal metastases within the gastrohepatic ligament and upper retroperitoneum. This nodal disease appears mildly progressive compared with CT of 4 weeks ago. 2. No definite hypermetabolic activity at the colonic anastomosis as suggested on previous CT. 3. No hepatic or other definite distant metastases identified. Low-level hilar activity bilaterally is nonspecific, but likely reactive. 4. Recent compression deformities at T10 and T11 with mild hypermetabolic activity.      INTERVAL HISTORY:  Richard Spears was contacted to review his recent scan results. I called him and his son and verified his identity. He was last seen by me on 02/18/22.   He is very fatigued, spends most of time in chair and bed. He is able to do his ADLs slowly, he still goes outside with a walker. His appetite is very low, he has diarrhea, 1-2 times  a day. He has mild pain in epigastric area and it's intermittent. He takes oxycodone 3 times day, which is managed by his PCP.   All other systems were reviewed with the patient and are negative.  MEDICAL HISTORY:  Past Medical History:  Diagnosis Date   Anemia    leakoppenia   BPH (benign prostatic hypertrophy)    Bullous pemphigoid    [name], March 2011, right forearm squamous cell carcinoma   Chronic anticoagulation    systemic   Colon polyp    transverse, 2002   History of peptic ulcer    remote, 3/95   Hx of actinic keratosis    Hx of basal cell carcinoma    Hx of squamous cell carcinoma of skin     Hyperlipidemia    Left inguinal hernia    Moderate aortic insufficiency 2009   audible aortic insufficiency on 1/09 echo   PAF (paroxysmal atrial fibrillation) ([place]) 01/17/2014   On Warfarin.   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 Jude mechanical valve   ORIF FEMUR FRACTURE Left 08/28/2020   Procedure: OPEN REDUCTION INTERNAL FIXATION (ORIF) DISTAL FEMUR FRACTURE;  Surgeon: Rod Can, MD;  Location: [facility];  Service: Orthopedics;  Laterality: Left;   PARTIAL COLECTOMY N/A 06/30/2021   Procedure: PARTIAL COLECTOMY;  Surgeon: Clovis Riley, MD;  Location: [facility];  Service: General;  Laterality: N/A;   TOTAL HIP ARTHROPLASTY Right 10/12/2017   Procedure: RIGHT TOTAL HIP ARTHROPLASTY ANTERIOR APPROACH;  Surgeon: Gaynelle Arabian, MD;  Location: [facility];  Service: Orthopedics;  Laterality: Right;   TRANSTHORACIC ECHOCARDIOGRAM  12/2018   Unable to assess diastolic function because of A. fib. Normal RV function, but moderately elevated RVSP.  Severe biatrial enlargement. S/P St Jude bileaflet mechanical MVR that appears to be functioning normally. Mitral valve regurgitation cannot assess due to mechanical valve shadowing. MV Mean grad: 7.0 mmHg MV Area (PHT): 3.38 cm (stable for valve).  Mild Ao  Sclerosis, Mild-Mod AI   TRANSTHORACIC ECHOCARDIOGRAM  08/'17; 10/'18    a) Mild conc LVH. EF 55-60%. No RWMA. Mod AI. Mechanical MV prosthesis functioning properly. LAD dilation.;; b)  EF 55-60%.  Mo AI.  Bileaflet Saint Jude mechanical MV with no paravalvular leak.  Severe LA dilation.  Minimally elevated PAP    I have reviewed the social history and family history with the patient and they are unchanged from previous note.  ALLERGIES:  is allergic to flexeril [cyclobenzaprine].  MEDICATIONS:  Current Outpatient Medications  Medication Sig Dispense Refill   acetaminophen (TYLENOL) 500 MG tablet Take 1,000 mg by mouth in the morning and at bedtime.     acetaminophen (TYLENOL) 500 MG tablet Take 1,000 mg by mouth 3 (three) times daily as needed.  bisoprolol (ZEBETA) 5 MG tablet Take 1 tablet (5 mg total) by mouth daily. 90 tablet 3   ferrous sulfate 325 (65 FE) MG tablet Take 1 tablet (325 mg total) by mouth daily with breakfast.  3   furosemide (LASIX) 20 MG tablet Take 20 mg by mouth daily with breakfast.     isosorbide mononitrate (IMDUR) 30 MG 24 hr tablet Take 30 mg by mouth daily.     lactose free nutrition (BOOST) LIQD Take 237 mLs by mouth 2 (two) times daily.     loperamide (IMODIUM A-D) 2 MG tablet Take 2 tablets (4 mg total) by mouth daily as needed for diarrhea or loose stools. 30 tablet 0   LORazepam (ATIVAN) 0.5 MG tablet Take 1 tablet (0.5 mg total) by mouth daily. 30 tablet 0   mirtazapine (REMERON) 15 MG tablet Take 15 mg by mouth at bedtime.     Multiple Vitamins-Minerals (MULTIVITAMIN PO) Take 1 tablet by mouth daily.     Multiple Vitamins-Minerals (PRESERVISION AREDS 2) CAPS Take 1 capsule by mouth daily. 90 capsule 0   omeprazole (PRILOSEC) 20 MG capsule Take 20 mg by mouth daily.     oxyCODONE (ROXICODONE) 5 MG immediate release tablet Take 1 tablet (5 mg total) by mouth 2 (two) times daily as needed for severe pain or breakthrough pain. 30 tablet 0   polyethylene  glycol (MIRALAX / GLYCOLAX) 17 g packet Take 17 g by mouth daily as needed for mild constipation.     senna (SENOKOT) 8.6 MG TABS tablet Take 2 tablets by mouth every evening.     sodium chloride 1 g tablet Take 1 g by mouth once.     triamcinolone ointment (KENALOG) 0.5 % Apply 1 Application topically 2 (two) times daily as needed.     warfarin (COUMADIN) 2.5 MG tablet Take 2.5 mg by mouth as directed. Sunday, Monday, Tuesday, Thursday, Friday, and Saturday.     warfarin (COUMADIN) 5 MG tablet Take 5 mg by mouth as directed. Wed only     zinc oxide 20 % ointment Apply 1 application. topically as needed for irritation.     No current facility-administered medications for this visit.    PHYSICAL EXAMINATION: ECOG PERFORMANCE STATUS: 3 - Symptomatic, >50% confined to bed  There were no vitals filed for this visit. Wt Readings from Last 3 Encounters:  03/09/22 133 lb 6.4 oz (60.5 kg)  03/04/22 133 lb 6.4 oz (60.5 kg)  02/23/22 134 lb (60.8 kg)     No vitals taken today, Exam not performed today  LABORATORY DATA:  I have reviewed the data as listed    Latest Ref Rng & Units 02/18/2022    2:07 PM 01/28/2022    9:55 AM 01/08/2022    2:15 PM  CBC  WBC 4.0 - 10.5 K/uL 4.5  4.1  4.4   Hemoglobin 13.0 - 17.0 g/dL 9.4  10.3  9.4   Hematocrit 39.0 - 52.0 % 27.3  30.5  28.2   Platelets 150 - 400 K/uL 170  173  192         Latest Ref Rng & Units 02/18/2022    2:07 PM 01/28/2022    9:55 AM 01/08/2022    2:15 PM  CMP  Glucose 70 - 99 mg/dL 112  150  69   BUN 8 - 23 mg/dL 34  35  33   Creatinine 0.61 - 1.24 mg/dL 1.22  1.24  1.30   Sodium 135 - 145 mmol/L 125  130  128   Potassium 3.5 - 5.1 mmol/L 4.7  3.9  4.3   Chloride 98 - 111 mmol/L 95  97  98   CO2 22 - 32 mmol/L $RemoveB'25  26  25   'yDZViRda$ Calcium 8.9 - 10.3 mg/dL 9.0  9.0  9.0   Total Protein 6.5 - 8.1 g/dL 6.6  7.5  7.1   Total Bilirubin 0.3 - 1.2 mg/dL 0.6  0.6  0.6   Alkaline Phos 38 - 126 U/L 56  61  64   AST 15 - 41 U/L 48  42  31   ALT  0 - 44 U/L $Remo'19  21  17       'QgzCg$ RADIOGRAPHIC STUDIES: I have personally reviewed the radiological images as listed and agreed with the findings in the report. No results found.    No orders of the defined types were placed in this encounter.  All questions were answered. The patient knows to call the clinic with any problems, questions or concerns. No barriers to learning was detected. The total time spent in the appointment was 30 minutes.     [name], MD 03/18/2022   I, Wilburn Mylar, am acting as scribe for [name], MD.   I have reviewed the above documentation for accuracy and completeness, and I agree with the above.

## 2022-03-19 ENCOUNTER — Encounter: Payer: Self-pay | Admitting: Orthopedic Surgery

## 2022-03-19 ENCOUNTER — Non-Acute Institutional Stay: Payer: Medicare Other | Admitting: Orthopedic Surgery

## 2022-03-19 DIAGNOSIS — R1310 Dysphagia, unspecified: Secondary | ICD-10-CM | POA: Diagnosis not present

## 2022-03-19 DIAGNOSIS — C184 Malignant neoplasm of transverse colon: Secondary | ICD-10-CM

## 2022-03-19 DIAGNOSIS — R2681 Unsteadiness on feet: Secondary | ICD-10-CM | POA: Diagnosis not present

## 2022-03-19 DIAGNOSIS — M6281 Muscle weakness (generalized): Secondary | ICD-10-CM | POA: Diagnosis not present

## 2022-03-19 DIAGNOSIS — R627 Adult failure to thrive: Secondary | ICD-10-CM | POA: Diagnosis not present

## 2022-03-19 DIAGNOSIS — M9702XD Periprosthetic fracture around internal prosthetic left hip joint, subsequent encounter: Secondary | ICD-10-CM | POA: Diagnosis not present

## 2022-03-19 DIAGNOSIS — Z9181 History of falling: Secondary | ICD-10-CM | POA: Diagnosis not present

## 2022-03-19 DIAGNOSIS — S300XXD Contusion of lower back and pelvis, subsequent encounter: Secondary | ICD-10-CM | POA: Diagnosis not present

## 2022-03-19 MED ORDER — MORPHINE SULFATE (CONCENTRATE) 20 MG/ML PO SOLN: 5.0000 mg | ORAL | 0 refills | Status: AC | PRN

## 2022-03-19 MED ORDER — LORAZEPAM 0.5 MG PO TABS: 0.5000 mg | ORAL_TABLET | Freq: Three times a day (TID) | ORAL | 0 refills | Status: AC | PRN

## 2022-03-19 NOTE — Progress Notes (Signed)
Location:  [facility] Room Number: 34/A Place of Service:  [facility] [phone]) Provider:  Yvonna Alanis, NP   Virgie Dad, MD  Patient Care Team: Virgie Dad, MD as PCP - General (Internal Medicine) Leonie Man, MD as PCP - Cardiology (Cardiology) [name], MD as Consulting Physician (Hematology)  Extended Emergency Contact Information Primary Emergency Contact: Chrissie Noa of [place] Phone: [phone] Mobile Phone: [phone] Relation: Son  Code Status:  DNR Goals of care: Advanced Directive information    03/09/2022   11:26 AM  Advanced Directives  Does Patient Have a Medical Advance Directive? Yes  Type of Advance Directive Out of facility DNR (pink MOST or yellow form);Living will  Does patient want to make changes to medical advance directive? No - Patient declined     Chief Complaint  Patient presents with   Acute Visit    Adult failure to thrive    HPI:  Pt is a 88 y.o. male seen today for acute visit due to adult failure to thrive.   09/19 CT chest revealed worsening bulky mass/adenopathy to gastrohepatic region and gastric wall thickening. 09/20 results discussed with Dr. Burr Medico. He is unfortunately not a candidate for chemo due to his deteriorated health. Keytruda discontinued. Palliative/hospice approach recommended.   This morning, Dustyn states he is dying. He refused breakfast and medications. I discussed hospice with son and patient- they are agreeable to consult hospice.    Past Medical History:  Diagnosis Date   Anemia    leakoppenia   BPH (benign prostatic hypertrophy)    Bullous pemphigoid    [name], March 2011, right forearm squamous cell carcinoma   Chronic anticoagulation    systemic   Colon polyp    transverse, 2002   History of peptic ulcer    remote, 3/95   Hx of actinic keratosis    Hx of basal cell carcinoma    Hx of squamous cell carcinoma of skin    Hyperlipidemia    Left  inguinal hernia    Moderate aortic insufficiency 2009   audible aortic insufficiency on 1/09 echo   PAF (paroxysmal atrial fibrillation) ([place]) 01/17/2014   On Warfarin.   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 Jude mechanical valve   ORIF FEMUR FRACTURE Left 08/28/2020   Procedure: OPEN REDUCTION INTERNAL FIXATION (ORIF) DISTAL FEMUR FRACTURE;  Surgeon: Rod Can, MD;  Location: [place];  Service: Orthopedics;  Laterality: Left;   PARTIAL COLECTOMY N/A 06/30/2021   Procedure: PARTIAL COLECTOMY;  Surgeon: Clovis Riley, MD;  Location: [facility];  Service: General;  Laterality: N/A;   TOTAL HIP ARTHROPLASTY Right 10/12/2017   Procedure: RIGHT TOTAL HIP ARTHROPLASTY ANTERIOR APPROACH;  Surgeon: Gaynelle Arabian, MD;  Location:  [facility];  Service: Orthopedics;  Laterality: Right;   TRANSTHORACIC ECHOCARDIOGRAM  12/2018   Unable to assess diastolic function because of A. fib. Normal RV function, but moderately elevated RVSP.  Severe biatrial enlargement. S/P St Jude bileaflet mechanical MVR that appears to be functioning normally. Mitral valve regurgitation cannot assess due to mechanical valve shadowing. MV Mean grad: 7.0 mmHg MV Area (PHT): 3.38 cm (stable for valve).  Mild Ao Sclerosis, Mild-Mod AI   TRANSTHORACIC ECHOCARDIOGRAM   08/'17; 10/'18    a) Mild conc LVH. EF 55-60%. No RWMA. Mod AI. Mechanical MV prosthesis functioning properly. LAD dilation.;; b)  EF 55-60%.  Mo AI.  Bileaflet Saint Jude mechanical MV with no paravalvular leak.  Severe LA dilation.  Minimally elevated PAP    Allergies  Allergen Reactions   Flexeril [Cyclobenzaprine] Diarrhea    Outpatient Encounter Medications as of 03/19/2022  Medication Sig   acetaminophen (TYLENOL) 500 MG tablet Take 1,000 mg by mouth in the morning and at bedtime.   acetaminophen (TYLENOL) 500 MG tablet Take 1,000 mg by mouth 3 (three) times daily as needed.   bisoprolol (ZEBETA) 5 MG tablet Take 1 tablet (5 mg total) by mouth daily.   ferrous sulfate 325 (65 FE) MG tablet Take 1 tablet (325 mg total) by mouth daily with breakfast.   furosemide (LASIX) 20 MG tablet Take 20 mg by mouth daily with breakfast.   isosorbide mononitrate (IMDUR) 30 MG 24 hr tablet Take 30 mg by mouth daily.   lactose free nutrition (BOOST) LIQD Take 237 mLs by mouth 2 (two) times daily.   loperamide (IMODIUM A-D) 2 MG tablet Take 2 tablets (4 mg total) by mouth daily as needed for diarrhea or loose stools.   LORazepam (ATIVAN) 0.5 MG tablet Take 1 tablet (0.5 mg total) by mouth daily.   mirtazapine (REMERON) 15 MG tablet Take 15 mg by mouth at bedtime.   Multiple Vitamins-Minerals (MULTIVITAMIN PO) Take 1 tablet by mouth daily.   Multiple Vitamins-Minerals (PRESERVISION AREDS 2) CAPS Take 1 capsule by mouth daily.   omeprazole (PRILOSEC) 20 MG capsule Take 20 mg by mouth daily.   oxyCODONE (ROXICODONE) 5 MG immediate release tablet Take 1 tablet (5 mg total) by mouth 2 (two) times daily as needed for severe pain or breakthrough pain.   polyethylene glycol (MIRALAX / GLYCOLAX) 17 g packet Take 17 g by mouth daily as needed for mild constipation.   senna (SENOKOT) 8.6 MG TABS tablet Take 2 tablets by mouth every evening.   sodium chloride 1 g tablet Take 1 g by mouth once.   triamcinolone  ointment (KENALOG) 0.5 % Apply 1 Application topically 2 (two) times daily as needed.   warfarin (COUMADIN) 2.5 MG tablet Take 2.5 mg by mouth as directed. Sunday, Monday, Tuesday, Thursday, Friday, and Saturday.   warfarin (COUMADIN) 5 MG tablet Take 5 mg by mouth as directed. Wed only   zinc oxide 20 % ointment Apply 1 application. topically as needed for irritation.   No facility-administered encounter medications on file as of 03/19/2022.    Review of Systems  Constitutional:  Negative for activity change, appetite change, chills, fatigue and fever.  Respiratory:  Negative for cough, shortness of breath and wheezing.   Cardiovascular:  Negative for chest pain and leg swelling.  Gastrointestinal:  Positive for abdominal pain and blood in stool. Negative for abdominal distention, constipation, diarrhea, nausea and vomiting.  Genitourinary:  Negative for dysuria.  Musculoskeletal:  Negative for gait problem.  Neurological:  Positive for weakness. Negative for dizziness.  Psychiatric/Behavioral:  Positive for dysphoric mood. Negative for sleep disturbance. The patient is not nervous/anxious.     Immunization History  Administered Date(s) Administered   Influenza Split 02/27/2012, 03/28/2013, 04/10/2014, 05/02/2015, 04/20/2016, 03/28/2017, 03/28/2018, 04/11/2019   Influenza, High Dose Seasonal PF 04/20/2016   Influenza,inj,Quad PF,6+ Mos 03/28/2018   Influenza-Unspecified 03/30/2017, 04/08/2020   Moderna SARS-COV2 Booster Vaccination 12/03/2020   Moderna Sars-Covid-2 Vaccination 07/02/2019, 07/30/2019, 05/12/2020   Pfizer Covid-19 Vaccine Bivalent Booster 45yr & up 04/15/2021   Pneumococcal Conjugate-13 06/10/2014   Pneumococcal Polysaccharide-23 02/02/2006   Pneumococcal-Unspecified 02/02/2006   Td 12/28/2002, 04/11/2014   Tdap 06/10/2014   Zoster, Live 03/29/2015, 11/28/2019, 01/28/2020   Zoster, Unspecified 05/02/2015   Pertinent  Health Maintenance Due  Topic Date Due    INFLUENZA VACCINE  03/19/2022 (Originally 01/26/2022)      12/11/2021    1:00 PM 01/08/2022    2:40 PM 01/28/2022   11:00 AM 01/29/2022    2:15 PM 02/18/2022    2:27 PM  Fall Risk  Falls in the past year?    1   Was there an injury with Fall?    1   Fall Risk Category Calculator    3   Fall Risk Category    High   Patient Fall Risk Level High fall risk High fall risk High fall risk High fall risk High fall risk  Patient at Risk for Falls Due to    History of fall(s);Impaired balance/gait;Impaired mobility   Fall risk Follow up    Falls evaluation completed;Education provided;Falls prevention discussed    Functional Status Survey:    Vitals:   03/19/22 1510  BP: 120/68  Pulse: (!) 108  Resp: 18  Temp: (!) 97 F (36.1 C)  SpO2: 98%  Weight: 130 lb 6.4 oz (59.1 kg)  Height: '5\' 11"'$  (1.803 m)   Body mass index is 18.19 kg/m. Physical Exam  Labs reviewed: Recent Labs    07/06/21 0156 07/06/21 1216 11/13/21 0347 11/14/21 0331 11/16/21 0333 11/17/21 0311 11/20/21 0000 01/08/22 1415 01/28/22 0955 02/18/22 1407  NA  --    < > 134*   < > 136 135   < > 128* 130* 125*  K  --    < > 3.8   < > 4.6 4.9   < > 4.3 3.9 4.7  CL  --    < > 103   < > 105 106   < > 98 97* 95*  CO2  --    < > 26   < > 25 25   < > '25 26 25  '$ GLUCOSE  --    < > 97   < > 126* 97   < > 69* 150* 112*  BUN  --    < > 43*   < > 29* 34*   < > 33* 35* 34*  CREATININE  --    < > 1.60*   < > 1.22 1.20   < > 1.30* 1.24 1.22  CALCIUM  --    < > 8.2*   < > 8.6* 8.6*   < > 9.0 9.0 9.0  MG  --    < > 2.3  --  2.3 2.2  --   --   --   --   PHOS 3.0  --   --   --  2.7 2.7  --   --   --   --    < > =  values in this interval not displayed.   Recent Labs    01/08/22 1415 01/28/22 0955 02/18/22 1407  AST 31 42* 48*  ALT '17 21 19  '$ ALKPHOS 64 61 56  BILITOT 0.6 0.6 0.6  PROT 7.1 7.5 6.6  ALBUMIN 3.6 3.9 3.5   Recent Labs    01/08/22 1415 01/28/22 0955 02/18/22 1407  WBC 4.4 4.1 4.5  NEUTROABS 3.1 3.4 3.5  HGB  9.4* 10.3* 9.4*  HCT 28.2* 30.5* 27.3*  MCV 96.6 94.7 92.2  PLT 192 173 170   Lab Results  Component Value Date   TSH 6.566 (H) 02/18/2022   No results found for: "HGBA1C" No results found for: "CHOL", "HDL", "LDLCALC", "LDLDIRECT", "TRIG", "CHOLHDL"  Significant Diagnostic Results in last 30 days:  CT CHEST ABDOMEN PELVIS W CONTRAST  Result Date: 03/17/2022 CLINICAL DATA:  Transverse colon cancer restaging * Tracking Code: BO * EXAM: CT CHEST, ABDOMEN, AND PELVIS WITH CONTRAST TECHNIQUE: Multidetector CT imaging of the chest, abdomen and pelvis was performed following the standard protocol during bolus administration of intravenous contrast. RADIATION DOSE REDUCTION: This exam was performed according to the departmental dose-optimization program which includes automated exposure control, adjustment of the mA and/or kV according to patient size and/or use of iterative reconstruction technique. CONTRAST:  69m OMNIPAQUE IOHEXOL 300 MG/ML  SOLN COMPARISON:  Multiple exams, including 01/01/2022 and PET-CT 10/16/2021 FINDINGS: CT CHEST FINDINGS Cardiovascular: Coronary, aortic arch, and branch vessel atherosclerotic vascular disease. Moderate cardiomegaly. Mitral valve prosthesis. Enlarged right atrium and left atrium. Mediastinum/Nodes: No pathologic adenopathy observed. Lungs/Pleura: Mild biapical pleuroparenchymal scarring. Subsegmental atelectasis anteriorly in the right lower lobe. Airway plugging in the lingula. Musculoskeletal: Degenerative glenohumeral arthropathy bilaterally. T10 compression fracture with some nitrogen gas phenomenon long the anterior fracture plane and about 50% loss of vertebral body height of which is mostly similar to the 10/16/2021 exam. New 60% compression fracture at T11 with vertebral sclerosis and central lucency along the vertebral body. No substantial degree of T11 posterior bony retropulsion. CT ABDOMEN PELVIS FINDINGS Hepatobiliary: Conglomerate gastrohepatic  adenopathy measuring 16.9 by 14.0 by 10.4 cm (volume = 1290 cm^3) on image 49 series 5, previously measured at 6.5 by 6.1 by 6.3 cm (volume = 130 cm^3). This abuts the left hepatic lobe margin, although well-defined hepatic invasion is not well seen. The lesion abuts the left margin of the hepatic vein. Stable 0.5 cm hypodense lesion in the dome of the liver on image 5 series 7 is technically nonspecific although statistically likely to be benign. Gallbladder unremarkable.  No biliary dilatation. Pancreas: The large gastrohepatic mass is not readily separable from the pancreas but is likely primarily above the pancreas. Spleen: Unremarkable Adrenals/Urinary Tract: No significant findings. Distal ureters in urinary bladder partially obscured by streak artifact from the hip implants. Stomach/Bowel: Stomach is substantially displaced by the gastrohepatic mass. Cannot exclude wall thickening along the gastric margin of the mass for example on image 82 series 2. Prominence of stool in the rectum. Left groin hernia contains a loop of bowel without findings of strangulation or obstruction. Vascular/Lymphatic: Atherosclerosis is present, including aortoiliac atherosclerotic disease. Atheromatous plaque narrows the origins of the celiac artery and SMA, without overt occlusion. Retrocaval node 1.0 cm in short axis on image 59 series 2, formerly 0.9 cm by my measurement. A second retrocaval node measures 0.8 cm in short axis on image 76 series 2, formerly the same. Reproductive: The prostate is obscured by streak artifact from the hip implants. Other: Trace ascites in the  left upper quadrant on image 63 series 2. Mild edema in the mesentery, nonspecific. Musculoskeletal: Left inguinal hernia containing a loop of bowel. Thickening along the right spermatic cord, query thickening of the spermatic cord versus fluid. Bilateral total hip prostheses. Prior healed left pubic ramus fractures. Deformity of the left subtrochanteric  femur likely from prior healed fracture. Stable L1 compression fracture. Stable mild superior endplate compression at L2. Multilevel mild foraminal impingement in the lumbar spine. IMPRESSION: 1. Markedly worsened bulky conglomerate mass/adenopathy in the gastrohepatic ligament region, currently measuring at 1290 cc which represents a 890% increase in volume compared to 01/01/2022. Difficult to separate this lesion from the pancreas. The adjacent gastric wall is thickened, gastric wall invasion not excluded. 2. Retrocaval lymph nodes shown on the PET-CT from 10/16/2021 remain relatively small in size at 1 cm or less in short axis. 3. Worsening 60% compression fracture at T11. Stable 50% subacute compression fracture at T10. 4. Other imaging findings of potential clinical significance: Aortic Atherosclerosis (ICD10-I70.0). Coronary and systemic atherosclerosis. Moderate cardiomegaly. Mitral valve prosthesis. Airway plugging in the lingula. Trace ascites in the left upper quadrant with mild mesenteric edema. Left inguinal hernia containing loop of bowel without complicating feature. Thickened right spermatic cord. Stable compressions at L1 and L2. Mild multilevel foraminal impingement in the lumbar spine. Electronically Signed   By: Van Clines M.D.   On: 03/17/2022 14:30    Assessment/Plan 1. Cancer of transverse colon ([place]) 09/19 CT chest revealed worsening bulky mass/adenopathy to gastrohepatic region and gastric wall thickening - not a candidate for chemo at this time - Keytruda discontinued - palliative/hospice recommended  2. Adult failure to thrive - refusing medications and food this morning - son and patient agree to enroll in hospice - hospice consulted - morphine (ROXANOL) 20 MG/ML concentrated solution; Take 0.25 mLs (5 mg total) by mouth every 4 (four) hours as needed for severe pain.  Dispense: 30 mL; Refill: 0 - LORazepam (ATIVAN) 0.5 MG tablet; Take 1 tablet (0.5 mg total) by mouth  3 (three) times daily as needed for anxiety.  Dispense: 30 tablet; Refill: 0    Family/ staff Communication: plan discussed with patient, son and nurse  Labs/tests ordered:  none

## 2022-03-20 DIAGNOSIS — Z9181 History of falling: Secondary | ICD-10-CM | POA: Diagnosis not present

## 2022-03-20 DIAGNOSIS — R1310 Dysphagia, unspecified: Secondary | ICD-10-CM | POA: Diagnosis not present

## 2022-03-20 DIAGNOSIS — S300XXD Contusion of lower back and pelvis, subsequent encounter: Secondary | ICD-10-CM | POA: Diagnosis not present

## 2022-03-20 DIAGNOSIS — R2681 Unsteadiness on feet: Secondary | ICD-10-CM | POA: Diagnosis not present

## 2022-03-20 DIAGNOSIS — M6281 Muscle weakness (generalized): Secondary | ICD-10-CM | POA: Diagnosis not present

## 2022-03-20 DIAGNOSIS — M9702XD Periprosthetic fracture around internal prosthetic left hip joint, subsequent encounter: Secondary | ICD-10-CM | POA: Diagnosis not present

## 2022-03-22 DIAGNOSIS — M9702XD Periprosthetic fracture around internal prosthetic left hip joint, subsequent encounter: Secondary | ICD-10-CM | POA: Diagnosis not present

## 2022-03-22 DIAGNOSIS — S300XXD Contusion of lower back and pelvis, subsequent encounter: Secondary | ICD-10-CM | POA: Diagnosis not present

## 2022-03-22 DIAGNOSIS — M6281 Muscle weakness (generalized): Secondary | ICD-10-CM | POA: Diagnosis not present

## 2022-03-22 DIAGNOSIS — R2681 Unsteadiness on feet: Secondary | ICD-10-CM | POA: Diagnosis not present

## 2022-03-22 DIAGNOSIS — R1310 Dysphagia, unspecified: Secondary | ICD-10-CM | POA: Diagnosis not present

## 2022-03-22 DIAGNOSIS — Z9181 History of falling: Secondary | ICD-10-CM | POA: Diagnosis not present

## 2022-03-23 DIAGNOSIS — S300XXD Contusion of lower back and pelvis, subsequent encounter: Secondary | ICD-10-CM | POA: Diagnosis not present

## 2022-03-23 DIAGNOSIS — M9702XD Periprosthetic fracture around internal prosthetic left hip joint, subsequent encounter: Secondary | ICD-10-CM | POA: Diagnosis not present

## 2022-03-23 DIAGNOSIS — M6281 Muscle weakness (generalized): Secondary | ICD-10-CM | POA: Diagnosis not present

## 2022-03-23 DIAGNOSIS — R1310 Dysphagia, unspecified: Secondary | ICD-10-CM | POA: Diagnosis not present

## 2022-03-23 DIAGNOSIS — R2681 Unsteadiness on feet: Secondary | ICD-10-CM | POA: Diagnosis not present

## 2022-03-23 DIAGNOSIS — Z9181 History of falling: Secondary | ICD-10-CM | POA: Diagnosis not present

## 2022-03-24 ENCOUNTER — Other Ambulatory Visit: Payer: Medicare Other

## 2022-03-24 ENCOUNTER — Ambulatory Visit: Payer: Medicare Other | Admitting: Hematology

## 2022-03-24 ENCOUNTER — Ambulatory Visit: Payer: Medicare Other

## 2022-03-24 DIAGNOSIS — M9702XD Periprosthetic fracture around internal prosthetic left hip joint, subsequent encounter: Secondary | ICD-10-CM | POA: Diagnosis not present

## 2022-03-24 DIAGNOSIS — R1310 Dysphagia, unspecified: Secondary | ICD-10-CM | POA: Diagnosis not present

## 2022-03-24 DIAGNOSIS — S300XXD Contusion of lower back and pelvis, subsequent encounter: Secondary | ICD-10-CM | POA: Diagnosis not present

## 2022-03-24 DIAGNOSIS — R2681 Unsteadiness on feet: Secondary | ICD-10-CM | POA: Diagnosis not present

## 2022-03-24 DIAGNOSIS — M6281 Muscle weakness (generalized): Secondary | ICD-10-CM | POA: Diagnosis not present

## 2022-03-24 DIAGNOSIS — Z9181 History of falling: Secondary | ICD-10-CM | POA: Diagnosis not present

## 2022-03-25 DIAGNOSIS — I080 Rheumatic disorders of both mitral and aortic valves: Secondary | ICD-10-CM | POA: Diagnosis not present

## 2022-03-25 DIAGNOSIS — M6281 Muscle weakness (generalized): Secondary | ICD-10-CM | POA: Diagnosis not present

## 2022-03-25 DIAGNOSIS — F329 Major depressive disorder, single episode, unspecified: Secondary | ICD-10-CM | POA: Diagnosis not present

## 2022-03-25 DIAGNOSIS — S300XXD Contusion of lower back and pelvis, subsequent encounter: Secondary | ICD-10-CM | POA: Diagnosis not present

## 2022-03-25 DIAGNOSIS — R1310 Dysphagia, unspecified: Secondary | ICD-10-CM | POA: Diagnosis not present

## 2022-03-25 DIAGNOSIS — I10 Essential (primary) hypertension: Secondary | ICD-10-CM | POA: Diagnosis not present

## 2022-03-25 DIAGNOSIS — I428 Other cardiomyopathies: Secondary | ICD-10-CM | POA: Diagnosis not present

## 2022-03-25 DIAGNOSIS — Z9181 History of falling: Secondary | ICD-10-CM | POA: Diagnosis not present

## 2022-03-25 DIAGNOSIS — R109 Unspecified abdominal pain: Secondary | ICD-10-CM | POA: Diagnosis not present

## 2022-03-25 DIAGNOSIS — C4492 Squamous cell carcinoma of skin, unspecified: Secondary | ICD-10-CM | POA: Diagnosis not present

## 2022-03-25 DIAGNOSIS — I25119 Atherosclerotic heart disease of native coronary artery with unspecified angina pectoris: Secondary | ICD-10-CM | POA: Diagnosis not present

## 2022-03-25 DIAGNOSIS — E785 Hyperlipidemia, unspecified: Secondary | ICD-10-CM | POA: Diagnosis not present

## 2022-03-25 DIAGNOSIS — C184 Malignant neoplasm of transverse colon: Secondary | ICD-10-CM | POA: Diagnosis not present

## 2022-03-25 DIAGNOSIS — R2681 Unsteadiness on feet: Secondary | ICD-10-CM | POA: Diagnosis not present

## 2022-03-25 DIAGNOSIS — M9702XD Periprosthetic fracture around internal prosthetic left hip joint, subsequent encounter: Secondary | ICD-10-CM | POA: Diagnosis not present

## 2022-03-25 DIAGNOSIS — R634 Abnormal weight loss: Secondary | ICD-10-CM | POA: Diagnosis not present

## 2022-03-25 DIAGNOSIS — I739 Peripheral vascular disease, unspecified: Secondary | ICD-10-CM | POA: Diagnosis not present

## 2022-03-26 DIAGNOSIS — I080 Rheumatic disorders of both mitral and aortic valves: Secondary | ICD-10-CM | POA: Diagnosis not present

## 2022-03-26 DIAGNOSIS — R109 Unspecified abdominal pain: Secondary | ICD-10-CM | POA: Diagnosis not present

## 2022-03-26 DIAGNOSIS — I739 Peripheral vascular disease, unspecified: Secondary | ICD-10-CM | POA: Diagnosis not present

## 2022-03-26 DIAGNOSIS — R634 Abnormal weight loss: Secondary | ICD-10-CM | POA: Diagnosis not present

## 2022-03-26 DIAGNOSIS — C184 Malignant neoplasm of transverse colon: Secondary | ICD-10-CM | POA: Diagnosis not present

## 2022-03-26 DIAGNOSIS — I10 Essential (primary) hypertension: Secondary | ICD-10-CM | POA: Diagnosis not present

## 2022-03-28 DIAGNOSIS — R109 Unspecified abdominal pain: Secondary | ICD-10-CM | POA: Diagnosis not present

## 2022-03-28 DIAGNOSIS — C4492 Squamous cell carcinoma of skin, unspecified: Secondary | ICD-10-CM | POA: Diagnosis not present

## 2022-03-28 DIAGNOSIS — C184 Malignant neoplasm of transverse colon: Secondary | ICD-10-CM | POA: Diagnosis not present

## 2022-03-28 DIAGNOSIS — I428 Other cardiomyopathies: Secondary | ICD-10-CM | POA: Diagnosis not present

## 2022-03-28 DIAGNOSIS — E785 Hyperlipidemia, unspecified: Secondary | ICD-10-CM | POA: Diagnosis not present

## 2022-03-28 DIAGNOSIS — I25119 Atherosclerotic heart disease of native coronary artery with unspecified angina pectoris: Secondary | ICD-10-CM | POA: Diagnosis not present

## 2022-03-28 DIAGNOSIS — R634 Abnormal weight loss: Secondary | ICD-10-CM | POA: Diagnosis not present

## 2022-03-28 DIAGNOSIS — I739 Peripheral vascular disease, unspecified: Secondary | ICD-10-CM | POA: Diagnosis not present

## 2022-03-28 DIAGNOSIS — I080 Rheumatic disorders of both mitral and aortic valves: Secondary | ICD-10-CM | POA: Diagnosis not present

## 2022-03-28 DIAGNOSIS — I10 Essential (primary) hypertension: Secondary | ICD-10-CM | POA: Diagnosis not present

## 2022-03-28 DIAGNOSIS — F329 Major depressive disorder, single episode, unspecified: Secondary | ICD-10-CM | POA: Diagnosis not present

## 2022-04-28 DEATH — deceased

## 2022-06-26 IMAGING — PT NM PET TUM IMG INITIAL (PI) SKULL BASE T - THIGH
1 series · 7 of 7 positions shown · non-contrast
Comparison: Abdominopelvic CT 09/17/2021 and 06/24/2021.

CLINICAL DATA: Subsequent treatment strategy for stage IV colon
cancer. Partial colectomy 06/30/2021 with findings on recent CT
suspicious for anastomotic recurrence and nodal metastases.

EXAM:
NUCLEAR MEDICINE PET SKULL BASE TO THIGH
TECHNIQUE: 6.95 mCi F-18 FDG was injected intravenously. Full-ring PET imaging
was performed from the skull base to thigh after the radiotracer. CT
data was obtained and used for attenuation correction and anatomic
localization.
Fasting blood glucose: 89 mg/dl

[Series 1088: results mm oncology reading · 1.0mm · 0.80mm/px · 7 of 7 slices shown]
[im 1/7]
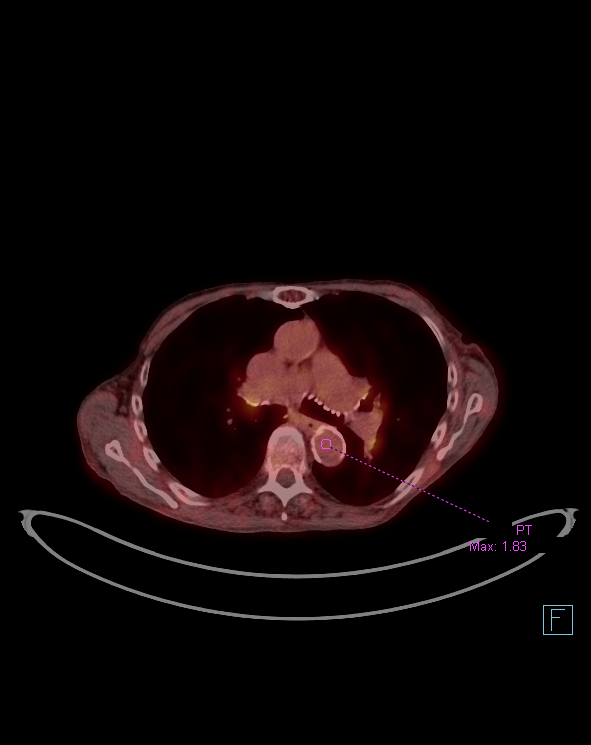
[im 2/7]
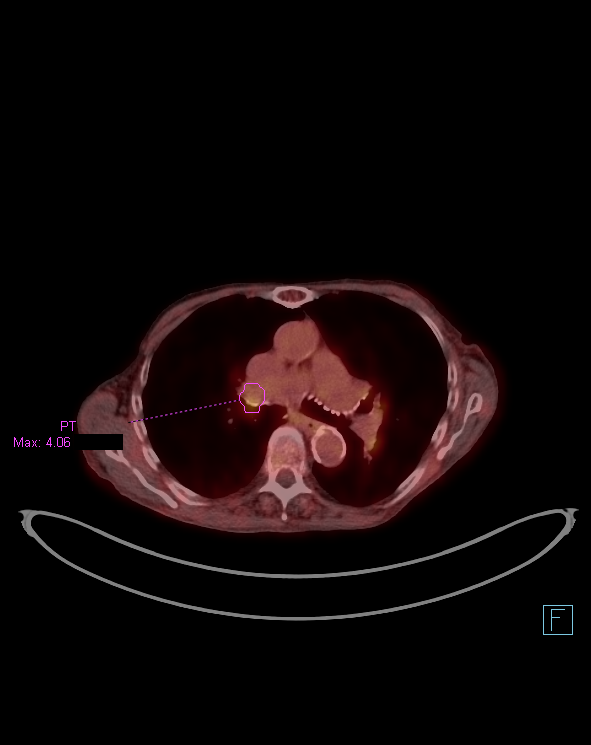
[im 3/7]
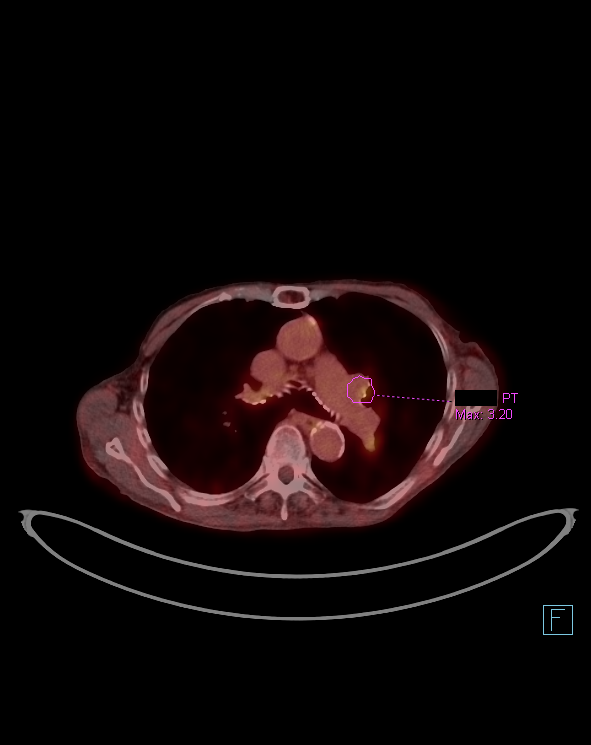
[im 4/7]
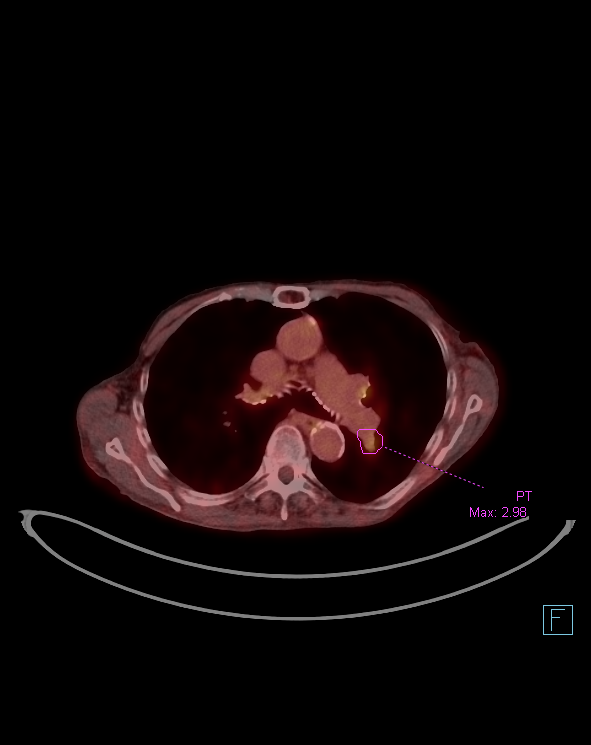
[im 5/7]
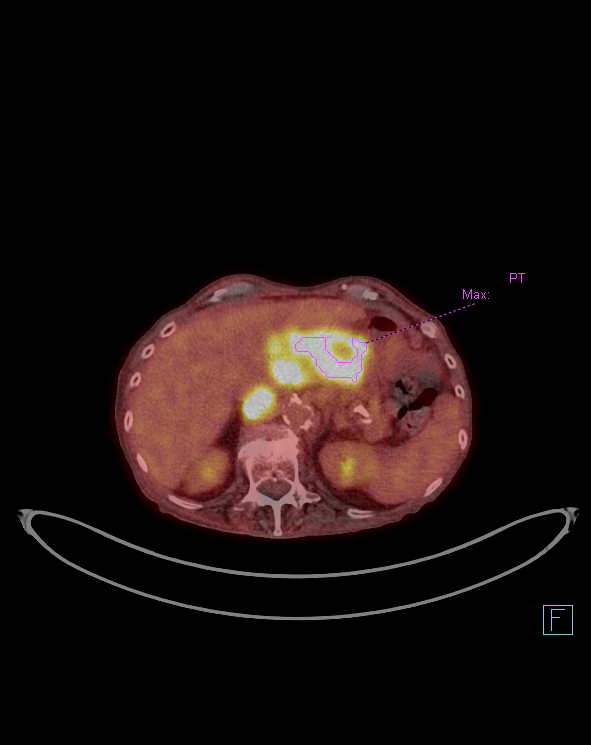
[im 6/7]
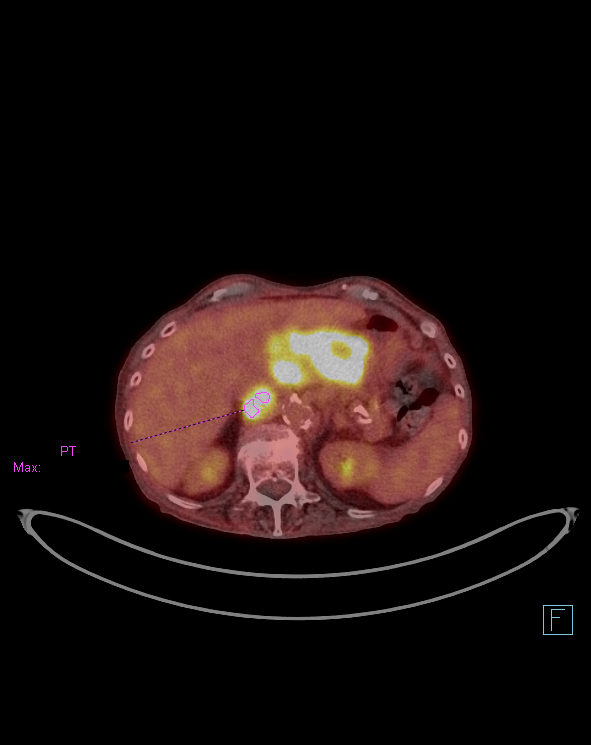
[im 7/7]
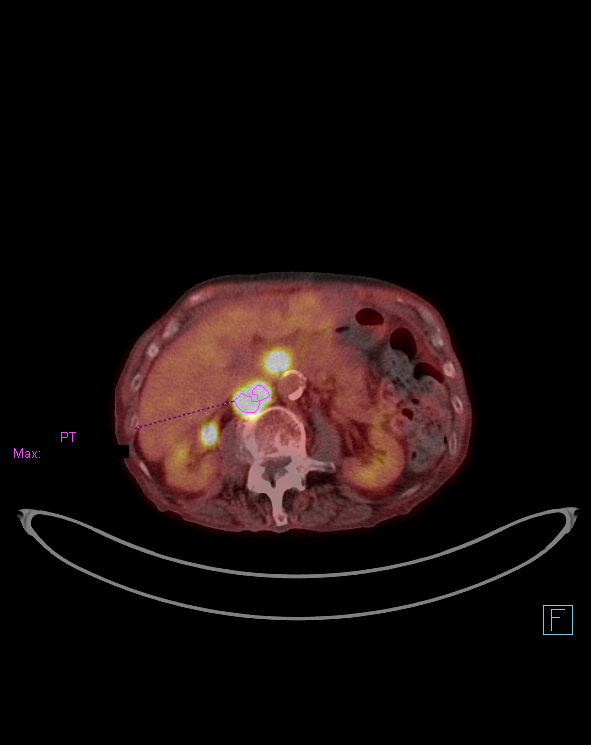

[7 of 7 positions shown; findings below may reference images not displayed]

FINDINGS: Mediastinal blood pool activity: SUV max

Liver activity: SUV max NA

NECK:

No hypermetabolic cervical lymph nodes are identified.There are no
lesions of the pharyngeal mucosal space.

Incidental CT findings: Bilateral carotid atherosclerosis.

CHEST:

There is low-level hypermetabolic activity within several hilar
lymph nodes bilaterally. The most hypermetabolic of these nodes have
an SUV max of 4.1 on the right and 3.2 on the left. No
hypermetabolic mediastinal or axillary adenopathy. No hypermetabolic
pulmonary activity or suspicious nodularity.

Incidental CT findings: Diffuse atherosclerosis of the aorta, great
vessels and coronary arteries status post median sternotomy and
mitral valve replacement. The heart is enlarged.

ABDOMEN/PELVIS:

In correlation with recent prior CT, there is no hypermetabolic
activity within the liver, adrenal glands, spleen or pancreas. The
multiple enlarged lymph nodes in the upper abdomen are
hypermetabolic. Large conglomerate mass in the gastrohepatic
ligament appears mildly progressive over the last 4 weeks. A 5.1 x
4.3 cm component on image 118/4 has an SUV max of 21.6. Right
periaortic node at the level of the renal hilum has a short axis
dimension of 2.1 cm and an SUV max of 26.5. No hypermetabolic lymph
nodes are seen within the pelvis. There is no definite
hypermetabolic activity associated with the colonic anastomosis.

Incidental CT findings: Aortic and branch vessel atherosclerosis.
Scattered contrast material in the bowel related to previous CT.
Grossly stable left inguinal hernia containing bowel, without
evidence of obstruction.

SKELETON:

There is no hypermetabolic activity to suggest osseous metastatic
disease. There are compression deformities at T10 and T11 associated
with mild hypermetabolic activity (SUV max 3.4). Hypermetabolic
activity extends into the spinous process of T9. These fractures
appear new compared with previous chest CT 07/02/2021.

Incidental CT findings: Multilevel lumbar spondylosis. Previous
bilateral total hip arthroplasty.
IMPRESSION: 1. Intense multifocal hypermetabolic nodal metastases within the
gastrohepatic ligament and upper retroperitoneum. This nodal disease
appears mildly progressive compared with CT of 4 weeks ago.
2. No definite hypermetabolic activity at the colonic anastomosis as
suggested on previous CT.
3. No hepatic or other definite distant metastases identified.
Low-level hilar activity bilaterally is nonspecific, but likely
reactive.
4. Recent compression deformities at T10 and T11 with mild
hypermetabolic activity.
